# Patient Record
Sex: Female | Born: 1955
Health system: Southern US, Community
[De-identification: ages and names within clinical notes are randomized; demographics above are authoritative.]

## PROBLEM LIST (undated history)

## (undated) DIAGNOSIS — I2510 Atherosclerotic heart disease of native coronary artery without angina pectoris: Secondary | ICD-10-CM

## (undated) DIAGNOSIS — I10 Essential (primary) hypertension: Secondary | ICD-10-CM

## (undated) DIAGNOSIS — M1990 Unspecified osteoarthritis, unspecified site: Secondary | ICD-10-CM

## (undated) DIAGNOSIS — N183 Chronic kidney disease, stage 3 unspecified: Secondary | ICD-10-CM

## (undated) DIAGNOSIS — E876 Hypokalemia: Secondary | ICD-10-CM

## (undated) DIAGNOSIS — I340 Nonrheumatic mitral (valve) insufficiency: Secondary | ICD-10-CM

## (undated) DIAGNOSIS — G43909 Migraine, unspecified, not intractable, without status migrainosus: Secondary | ICD-10-CM

## (undated) DIAGNOSIS — A419 Sepsis, unspecified organism: Secondary | ICD-10-CM

## (undated) DIAGNOSIS — D61818 Other pancytopenia: Secondary | ICD-10-CM

## (undated) DIAGNOSIS — D696 Thrombocytopenia, unspecified: Secondary | ICD-10-CM

## (undated) DIAGNOSIS — A0472 Enterocolitis due to Clostridium difficile, not specified as recurrent: Secondary | ICD-10-CM

## (undated) DIAGNOSIS — E559 Vitamin D deficiency, unspecified: Secondary | ICD-10-CM

## (undated) HISTORY — DX: Vitamin D deficiency, unspecified: E55.9

## (undated) HISTORY — DX: Unspecified osteoarthritis, unspecified site: M19.90

## (undated) HISTORY — DX: Sepsis, unspecified organism: A41.9

## (undated) HISTORY — DX: Enterocolitis due to Clostridium difficile, not specified as recurrent: A04.72

## (undated) HISTORY — DX: Thrombocytopenia, unspecified: D69.6

## (undated) HISTORY — DX: Hypokalemia: E87.6

## (undated) HISTORY — DX: Migraine, unspecified, not intractable, without status migrainosus: G43.909

## (undated) HISTORY — DX: Essential (primary) hypertension: I10

## (undated) HISTORY — DX: Chronic kidney disease, stage 3 (moderate): N18.3

## (undated) HISTORY — DX: Chronic kidney disease, stage 3 unspecified: N18.30

## (undated) HISTORY — PX: OTHER SURGICAL HISTORY: SHX169

## (undated) HISTORY — DX: Nonrheumatic mitral (valve) insufficiency: I34.0

## (undated) HISTORY — DX: Atherosclerotic heart disease of native coronary artery without angina pectoris: I25.10

---

## 1999-03-13 ENCOUNTER — Encounter: Admission: RE | Admit: 1999-03-13 | Discharge: 1999-03-13 | Payer: Self-pay | Admitting: Family Medicine

## 1999-03-13 ENCOUNTER — Encounter: Payer: Self-pay | Admitting: Family Medicine

## 2000-07-28 ENCOUNTER — Encounter: Payer: Self-pay | Admitting: Family Medicine

## 2000-07-28 ENCOUNTER — Encounter: Admission: RE | Admit: 2000-07-28 | Discharge: 2000-07-28 | Payer: Self-pay | Admitting: Family Medicine

## 2006-04-27 ENCOUNTER — Encounter: Admission: RE | Admit: 2006-04-27 | Discharge: 2006-04-27 | Payer: Self-pay | Admitting: Family Medicine

## 2010-01-31 ENCOUNTER — Encounter: Payer: Self-pay | Admitting: Family Medicine

## 2010-05-21 ENCOUNTER — Inpatient Hospital Stay (HOSPITAL_COMMUNITY)
Admission: EM | Admit: 2010-05-21 | Discharge: 2010-05-23 | DRG: 920 | Disposition: A | Discharge status: Home / Self Care | Payer: 59 | Source: Ambulatory Visit | Attending: Gastroenterology | Admitting: Gastroenterology

## 2010-05-21 DIAGNOSIS — E669 Obesity, unspecified: Secondary | ICD-10-CM | POA: Diagnosis present

## 2010-05-21 DIAGNOSIS — Y849 Medical procedure, unspecified as the cause of abnormal reaction of the patient, or of later complication, without mention of misadventure at the time of the procedure: Secondary | ICD-10-CM | POA: Diagnosis present

## 2010-05-21 DIAGNOSIS — Z882 Allergy status to sulfonamides status: Secondary | ICD-10-CM

## 2010-05-21 DIAGNOSIS — IMO0002 Reserved for concepts with insufficient information to code with codable children: Principal | ICD-10-CM | POA: Diagnosis present

## 2010-05-21 DIAGNOSIS — G43909 Migraine, unspecified, not intractable, without status migrainosus: Secondary | ICD-10-CM | POA: Diagnosis present

## 2010-05-21 DIAGNOSIS — Z860100 Personal history of colon polyps, unspecified: Secondary | ICD-10-CM

## 2010-05-21 DIAGNOSIS — K633 Ulcer of intestine: Secondary | ICD-10-CM | POA: Diagnosis present

## 2010-05-21 DIAGNOSIS — Y92009 Unspecified place in unspecified non-institutional (private) residence as the place of occurrence of the external cause: Secondary | ICD-10-CM

## 2010-05-21 DIAGNOSIS — D62 Acute posthemorrhagic anemia: Secondary | ICD-10-CM | POA: Diagnosis present

## 2010-05-21 DIAGNOSIS — K5730 Diverticulosis of large intestine without perforation or abscess without bleeding: Secondary | ICD-10-CM | POA: Diagnosis present

## 2010-05-21 LAB — DIFFERENTIAL
Basophils Absolute: 0 10*3/uL (ref 0.0–0.1)
Basophils Relative: 0 % (ref 0–1)
Eosinophils Absolute: 0.1 10*3/uL (ref 0.0–0.7)
Eosinophils Relative: 2 % (ref 0–5)
Lymphocytes Relative: 32 % (ref 12–46)
Lymphs Abs: 1.5 10*3/uL (ref 0.7–4.0)
Monocytes Absolute: 0.4 10*3/uL (ref 0.1–1.0)
Monocytes Relative: 9 % (ref 3–12)
Neutro Abs: 2.7 10*3/uL (ref 1.7–7.7)
Neutrophils Relative %: 57 % (ref 43–77)

## 2010-05-21 LAB — COMPREHENSIVE METABOLIC PANEL
BUN: 20 mg/dL (ref 6–23)
CO2: 24 mEq/L (ref 19–32)
Chloride: 107 mEq/L (ref 96–112)
Creatinine, Ser: 0.81 mg/dL (ref 0.4–1.2)
GFR calc non Af Amer: >60 mL/min (ref >60)
Glucose, Bld: 91 mg/dL (ref 70–99)
Total Bilirubin: 0.4 mg/dL (ref 0.3–1.2)

## 2010-05-21 LAB — CBC
Hemoglobin: 10.2 g/dL — ABNORMAL LOW (ref 12.0–15.0)
MCH: 30.3 pg (ref 26.0–34.0)
MCV: 89.6 fL (ref 78.0–100.0)
RBC: 3.37 MIL/uL — ABNORMAL LOW (ref 3.87–5.11)

## 2010-05-21 LAB — TYPE AND SCREEN
ABO/RH(D): O POS
Antibody Screen: NEGATIVE

## 2010-05-21 LAB — ABO/RH: ABO/RH(D): O POS

## 2010-05-22 LAB — BASIC METABOLIC PANEL
Chloride: 113 mEq/L — ABNORMAL HIGH (ref 96–112)
Creatinine, Ser: 0.8 mg/dL (ref 0.4–1.2)
GFR calc Af Amer: >60 mL/min (ref >60)
Potassium: 3.5 mEq/L (ref 3.5–5.1)

## 2010-05-22 LAB — CBC
MCH: 30.6 pg (ref 26.0–34.0)
Platelets: 101 10*3/uL — ABNORMAL LOW (ref 150–400)
RBC: 3.2 MIL/uL — ABNORMAL LOW (ref 3.87–5.11)
WBC: 3.5 10*3/uL — ABNORMAL LOW (ref 4.0–10.5)

## 2010-05-23 LAB — CBC
HCT: 28.8 % — ABNORMAL LOW (ref 36.0–46.0)
Hemoglobin: 9.9 g/dL — ABNORMAL LOW (ref 12.0–15.0)
MCHC: 34.4 g/dL (ref 30.0–36.0)
MCV: 90.7 fL (ref 78.0–100.0)
Platelets: 87 10*3/uL — ABNORMAL LOW (ref 150–400)
RBC: 3.02 MIL/uL — ABNORMAL LOW (ref 3.87–5.11)
WBC: 4.1 10*3/uL (ref 4.0–10.5)

## 2010-06-01 NOTE — Discharge Summary (Signed)
  NAME:  Dana Watkins, Dana Watkins              ACCOUNT NO.:  [id number]  MEDICAL RECORD NO.:  [id number]           PATIENT TYPE:  I  LOCATION:  2010                         FACILITY:  [facility]  PHYSICIAN:  Nelwyn Salisbury, MD, FACGDATE OF BIRTH:  02-Dec-1955  DATE OF ADMISSION:  05/21/2010 DATE OF DISCHARGE:  05/23/2010                              DISCHARGE SUMMARY   REASON FOR ADMISSION:  Rectal bleeding status post colonoscopy on May 18, 2010.  DISCHARGE DIAGNOSES: 1. Post polypectomy bleed, now resolved. 2. Anemia status post post polypectomy bleed with hemoglobin of 9.3 on     discharge. 3. Sigmoid diverticulosis. 4. Tubular adenoma removed from right colon on May 18, 2010. 5. History of migraine headaches on butalbital.  HOSPITAL COURSE:  [name] is a very pleasant 55 year old white female who underwent a routine screening colonoscopy on May 18, 2010, at the [facility] and was found to have 3 right colon polyps that were removed by hot snare and one small sigmoid polyp removed by cord biopsies.  On May 21, 2010, after dinner she developed rectal bleeding and started passing clots through the night into the next morning.  When she called the office she was asked to come to the emergency room where she was admitted by Dr. Benson Norway and was prepped for repeat colonoscopy which was done yesterday.  Bleeding site was noticed in the right colon where the patient had a polypectomy.  A clean based ulcer was noted with fresh heme around it.  This area was injected circumferentially with 4 mL of epinephrine, 1:10,000 and two resolution clips were applied over the ulcer.  Hemostasis was achieved.  The rest of the colon appeared normal except for a few scattered sigmoid diverticula.  There was some residual stool in the colon.  The appendiceal orifice and cecal base were not visualized.  The second polypectomy site appeared healthy as well.  The third polypectomy site was not  identified because of relatively limited visualization of the colon in this area.  The patient tolerated the procedure well without complications.  DISCHARGE INSTRUCTIONS: 1. The patient has been advised to follow a soft diet for the next     couple of days and then to resume her normal diet starting May 25, 2010. 2. Avoid all nonsteroidals including aspirin, Advil, Aleve, Motrin,     ibuprofen, Excedrin, BC's, Goody's, Alka Seltzer for the next 2     weeks. 3. Outpatient followup in the next 2 weeks for repeat CBC. 4. Multivitamin with iron after repeat CBC has been done to treat her     iron deficiency anemia. 5. Further recommendation to be made in followup. 6. The patient has been advised if she has any recurrent bleeding to     contact the office immediately for further recommendations.     Nelwyn Salisbury, MD, [facility]     JNM/MEDQ  D:  05/23/2010  T:  05/23/2010  Job:  [id number]  cc:   Juanita Craver, M.D.  Electronically Signed by Juanita Craver M.D. on 06/01/2010 04:46:54 PM

## 2010-06-01 NOTE — Consult Note (Signed)
  NAME:  Dana Watkins, Dana Watkins              ACCOUNT NO.:  [id number]  MEDICAL RECORD NO.:  [id number]           PATIENT TYPE:  I  LOCATION:  2010                         FACILITY:  [facility]  PHYSICIAN:  Tory Emerald. Benson Norway, MD    DATE OF BIRTH:  [birth year]  DATE OF CONSULTATION:  05/21/2010 DATE OF DISCHARGE:                                CONSULTATION   REASON FOR ADMISSION:  Post polypectomy bleed.  HISTORY OF PRESENT ILLNESS:  This is a 55 year old female without any significant past medical history who was admitted to the hospital with a post polypectomy bleed.  She underwent a colonoscopy by Dr. Collene Mares on May 18, 2010, for a routine screening colonoscopy and at that time she was identified to have three 5-6 mm polyps in her ascending colon.  These were removed with a hot snare and subsequently she started having bleeding issues that started last night.  In her estimation, she has had 10 bloody bowel movements, they range from fresh blood to dark blood and blood clots.  No complaints of any abdominal pain, nausea, vomiting, fever.  She does not complain of any chest pain or shortness of breath. Overall, she appears to be hemodynamically stable.  As a result of these symptoms, she called the office and was instructed by Dr. Collene Mares to present to the emergency room for further evaluation and treatment.  As a result of the bleeding issue, she was admitted to the hospital.  PAST MEDICAL HISTORY:  As stated above.  PAST SURGICAL HISTORY:  As stated above.  FAMILY HISTORY:  Noncontributory.  SOCIAL HISTORY:  Positive for occasional alcohol and occasional tobacco, no illicit drug use.  HOME MEDICATIONS:  A migraine headache that she uses on a p.r.n. basis.  ALLERGIES:  Possible SULFA allergy.  REVIEW OF SYSTEMS:  As stated above in history of present illness, otherwise, negative.  PHYSICAL EXAMINATION:  VITAL SIGNS:  Blood pressure is 135/83, heart rate 80, respirations 16. GENERAL:  The  patient is in no acute distress, alert and oriented. HEENT:  Normocephalic, atraumatic.  Extraocular muscles intact. NECK:  Supple.  No lymphadenopathy. LUNGS:  Clear to auscultation bilaterally. CARDIOVASCULAR:  Regular rate and rhythm. ABDOMEN:  Moderately obese, soft, nontender, nondistended.  Positive bowel sounds. EXTREMITIES:  No clubbing, cyanosis or edema. RECTAL:  Deferred at this time as there is some privacy issues at this time.  LABORATORY VALUES:  Pending at this time.  IMPRESSION:  Post polypectomy bleed.  It is clear from her history and a recent colonoscopy that this was a source of her bleeding.  A repeat colonoscopy will be performed at this time.  Currently, she is hemodynamically stable but it will be prudent to have her admitted to a telemetry unit to ensure that she does not have any significant changes overnight.  PLAN: 1. Colonoscopy tomorrow. 2. To check CBC. 3. To transfuse if hemoglobin is less than 8.     Tory Emerald Benson Norway, MD     PDH/MEDQ  D:  05/21/2010  T:  05/22/2010  Job:  [id number]  Electronically Signed by Carol Ada MD on 06/01/2010 06:49:11 AM

## 2011-05-06 ENCOUNTER — Other Ambulatory Visit: Payer: Self-pay | Admitting: Family Medicine

## 2011-05-06 DIAGNOSIS — Z1231 Encounter for screening mammogram for malignant neoplasm of breast: Secondary | ICD-10-CM

## 2011-05-27 ENCOUNTER — Ambulatory Visit: Payer: 59

## 2013-04-12 ENCOUNTER — Telehealth: Payer: Self-pay | Admitting: Hematology and Oncology

## 2013-04-12 NOTE — Telephone Encounter (Signed)
S/W PATIENT AND GAVE NEW PATIENT APPT FOR 04/20 @ 1:30 W/DR. Stark.  REFERRING DR. Juanita Craver DX- DEC'D PLTS, ELEVATED ALKALINE PHOSPHATASE WELCOME PACKET MAILED.

## 2013-04-12 NOTE — Telephone Encounter (Signed)
C/D 04/12/13 for appt. 04/30/13

## 2013-04-30 ENCOUNTER — Telehealth: Payer: Self-pay | Admitting: Hematology and Oncology

## 2013-04-30 ENCOUNTER — Encounter: Payer: Self-pay | Admitting: Hematology and Oncology

## 2013-04-30 ENCOUNTER — Ambulatory Visit (HOSPITAL_BASED_OUTPATIENT_CLINIC_OR_DEPARTMENT_OTHER): Payer: 59 | Admitting: Hematology and Oncology

## 2013-04-30 ENCOUNTER — Ambulatory Visit (HOSPITAL_BASED_OUTPATIENT_CLINIC_OR_DEPARTMENT_OTHER): Payer: 59

## 2013-04-30 ENCOUNTER — Ambulatory Visit: Payer: 59

## 2013-04-30 VITALS — BP 151/87 | HR 71 | Temp 98.4°F | Resp 18 | Ht 68.0 in | Wt 162.7 lb

## 2013-04-30 DIAGNOSIS — R7402 Elevation of levels of lactic acid dehydrogenase (LDH): Secondary | ICD-10-CM

## 2013-04-30 DIAGNOSIS — R7989 Other specified abnormal findings of blood chemistry: Secondary | ICD-10-CM | POA: Insufficient documentation

## 2013-04-30 DIAGNOSIS — R748 Abnormal levels of other serum enzymes: Secondary | ICD-10-CM

## 2013-04-30 DIAGNOSIS — D696 Thrombocytopenia, unspecified: Secondary | ICD-10-CM | POA: Insufficient documentation

## 2013-04-30 DIAGNOSIS — R945 Abnormal results of liver function studies: Secondary | ICD-10-CM

## 2013-04-30 DIAGNOSIS — R7401 Elevation of levels of liver transaminase levels: Secondary | ICD-10-CM

## 2013-04-30 DIAGNOSIS — R740 Nonspecific elevation of levels of transaminase and lactic acid dehydrogenase [LDH]: Secondary | ICD-10-CM

## 2013-04-30 LAB — CBC WITH DIFFERENTIAL/PLATELET
BASO%: 0.3 % (ref 0.0–2.0)
Basophils Absolute: 0 10*3/uL (ref 0.0–0.1)
EOS%: 2.8 % (ref 0.0–7.0)
Eosinophils Absolute: 0.1 10*3/uL (ref 0.0–0.5)
HCT: 37.3 % (ref 34.8–46.6)
HGB: 12.4 g/dL (ref 11.6–15.9)
LYMPH#: 1.1 10*3/uL (ref 0.9–3.3)
LYMPH%: 28 % (ref 14.0–49.7)
MCH: 30.5 pg (ref 25.1–34.0)
MCHC: 33.2 g/dL (ref 31.5–36.0)
MCV: 91.9 fL (ref 79.5–101.0)
MONO#: 0.4 10*3/uL (ref 0.1–0.9)
MONO%: 9.4 % (ref 0.0–14.0)
NEUT#: 2.3 10*3/uL (ref 1.5–6.5)
NEUT%: 59.5 % (ref 38.4–76.8)
Platelets: 106 10*3/uL — ABNORMAL LOW (ref 145–400)
RBC: 4.06 10*6/uL (ref 3.70–5.45)
RDW: 13.9 % (ref 11.2–14.5)
WBC: 3.9 10*3/uL (ref 3.9–10.3)

## 2013-04-30 LAB — HEPATIC FUNCTION PANEL
ALBUMIN: 4.1 g/dL (ref 3.5–5.2)
ALT: 28 U/L (ref 0–35)
AST: 40 U/L — ABNORMAL HIGH (ref 0–37)
Alkaline Phosphatase: 196 U/L — ABNORMAL HIGH (ref 39–117)
Bilirubin, Direct: 0.2 mg/dL (ref 0.0–0.3)
Indirect Bilirubin: 0.8 mg/dL (ref 0.2–1.2)
TOTAL PROTEIN: 6.8 g/dL (ref 6.0–8.3)
Total Bilirubin: 1 mg/dL (ref 0.2–1.2)

## 2013-04-30 LAB — CHCC SMEAR

## 2013-04-30 NOTE — Progress Notes (Signed)
Checked in new pt with no financial concerns. °

## 2013-04-30 NOTE — Progress Notes (Signed)
[facility] NOTE  Patient Care Team: Stephens Shire, MD as PCP - General (Family Medicine) Stephens Shire, MD as Referring Physician (Family Medicine)  CHIEF COMPLAINTS/PURPOSE OF CONSULTATION:  Chronic thrombocytopenia  HISTORY OF PRESENTING ILLNESS:  Dana Watkins 58 y.o. female is here because of thrombocytopenia.  She was found to have abnormal CBC from routine blood work. On review of her blood work since 2012, her platelet count fluctuated from 87,000 to 119,000. She complains of easy bruising and rare occasions of epistaxis, but denies hematuria, melena or hematochezia The patient denies history of liver disease, exposure to heparin, history of cardiac murmur/prior cardiovascular surgery or recent new medications She denies prior blood or platelet transfusions  MEDICAL HISTORY:  History reviewed. No pertinent past medical history.  SURGICAL HISTORY: History reviewed. No pertinent past surgical history.  SOCIAL HISTORY: History   Social History  . Marital Status: Married    Spouse Name: N/A    Number of Children: N/A  . Years of Education: N/A   Occupational History  . Not on file.   Social History Main Topics  . Smoking status: Never Smoker   . Smokeless tobacco: Never Used  . Alcohol Use: 0.6 oz/week    1 Glasses of wine per week  . Drug Use: No  . Sexual Activity: Not on file   Other Topics Concern  . Not on file   Social History Narrative  . No narrative on file    FAMILY HISTORY: History reviewed. No pertinent family history.  ALLERGIES:  is allergic to sulfa antibiotics.  MEDICATIONS:  Current Outpatient Prescriptions  Medication Sig Dispense Refill  . diazepam (VALIUM) 10 MG tablet Take 10 mg by mouth at bedtime as needed.      . naproxen sodium (ANAPROX) 220 MG tablet Take 220 mg by mouth as needed.      . Phendimetrazine Tartrate 35 MG TABS Take 35 mg by mouth 2 (two) times daily. Takes once or twice daily        No current facility-administered medications for this visit.    REVIEW OF SYSTEMS:   Constitutional: Denies fevers, chills or abnormal night sweats Eyes: Denies blurriness of vision, double vision or watery eyes Ears, nose, mouth, throat, and face: Denies mucositis or sore throat Respiratory: Denies cough, dyspnea or wheezes Cardiovascular: Denies palpitation, chest discomfort or lower extremity swelling Gastrointestinal:  Denies nausea, heartburn or change in bowel habits Skin: Denies abnormal skin rashes Lymphatics: Denies new lymphadenopathy or easy bruising Neurological:Denies numbness, tingling or new weaknesses Behavioral/Psych: Mood is stable, no new changes  All other systems were reviewed with the patient and are negative.  PHYSICAL EXAMINATION: ECOG PERFORMANCE STATUS: 0 - Asymptomatic  Filed Vitals:   04/30/13 1351  BP: 151/87  Pulse: 71  Temp: 98.4 F (36.9 C)  Resp: 18   Filed Weights   04/30/13 1351  Weight: 162 lb 11.2 oz (73.8 kg)    GENERAL:alert, no distress and comfortable SKIN: skin color, texture, turgor are normal, no rashes or significant lesions EYES: normal, conjunctiva are pink and non-injected, sclera clear OROPHARYNX:no exudate, no erythema and lips, buccal mucosa, and tongue normal  NECK: supple, thyroid normal size, non-tender, without nodularity LYMPH:  no palpable lymphadenopathy in the cervical, axillary or inguinal LUNGS: clear to auscultation and percussion with normal breathing effort HEART: regular rate & rhythm and no murmurs and no lower extremity edema ABDOMEN:abdomen soft, non-tender and normal bowel sounds Musculoskeletal:no cyanosis of digits and no  clubbing  PSYCH: alert & oriented x 3 with fluent speech NEURO: no focal motor/sensory deficits  LABORATORY DATA:  I have reviewed the data as listed Recent Results (from the past 2160 hour(s))  CBC WITH DIFFERENTIAL     Status: Abnormal   Collection Time    04/30/13  2:26 PM       Result Value Ref Range   WBC 3.9  3.9 - 10.3 10e3/uL   NEUT# 2.3  1.5 - 6.5 10e3/uL   HGB 12.4  11.6 - 15.9 g/dL   HCT 37.3  34.8 - 46.6 %   Platelets 106 Platelet count consistent in citrate (*) 145 - 400 10e3/uL   MCV 91.9  79.5 - 101.0 fL   MCH 30.5  25.1 - 34.0 pg   MCHC 33.2  31.5 - 36.0 g/dL   RBC 4.06  3.70 - 5.45 10e6/uL   RDW 13.9  11.2 - 14.5 %   lymph# 1.1  0.9 - 3.3 10e3/uL   MONO# 0.4  0.1 - 0.9 10e3/uL   Eosinophils Absolute 0.1  0.0 - 0.5 10e3/uL   Basophils Absolute 0.0  0.0 - 0.1 10e3/uL   NEUT% 59.5  38.4 - 76.8 %   LYMPH% 28.0  14.0 - 49.7 %   MONO% 9.4  0.0 - 14.0 %   EOS% 2.8  0.0 - 7.0 %   BASO% 0.3  0.0 - 2.0 %  [facility] SMEAR     Status: None   Collection Time    04/30/13  2:26 PM      Result Value Ref Range   Smear Result Smear Available     ASSESSMENT:  Thrombocytopenia  PLAN #1 Thrombocytopenia I suspect she may have ITP I will order an additional workup to exclude autoimmune disease, infection and nutritional causes. I will see her back after that to review test results. #2 elevated alkaline phosphatase and AST I will order a hepatic panel for further evaluation to exclude liver disease.

## 2013-04-30 NOTE — Telephone Encounter (Signed)
Gave pt appt for MD , sent pt to labs today

## 2013-05-01 LAB — VITAMIN B12: VITAMIN B 12: 366 pg/mL (ref 211–911)

## 2013-05-01 LAB — ANA: ANA: NEGATIVE

## 2013-05-01 LAB — HIV ANTIBODY (ROUTINE TESTING W REFLEX): HIV 1&2 Ab, 4th Generation: NONREACTIVE

## 2013-05-01 LAB — HEPATITIS C ANTIBODY: HCV AB: NEGATIVE

## 2013-05-01 LAB — RHEUMATOID FACTOR

## 2013-05-01 LAB — HEPATITIS B SURFACE ANTIGEN: HEP B S AG: NEGATIVE

## 2013-05-01 LAB — HEPATITIS B SURFACE ANTIBODY,QUALITATIVE: HEP B S AB: NEGATIVE

## 2013-05-11 ENCOUNTER — Ambulatory Visit: Payer: 59

## 2013-05-11 ENCOUNTER — Telehealth: Payer: Self-pay | Admitting: Hematology and Oncology

## 2013-05-11 ENCOUNTER — Ambulatory Visit (HOSPITAL_BASED_OUTPATIENT_CLINIC_OR_DEPARTMENT_OTHER): Payer: 59 | Admitting: Hematology and Oncology

## 2013-05-11 VITALS — BP 144/88 | HR 70 | Temp 97.7°F | Resp 18 | Ht 68.0 in | Wt 165.8 lb

## 2013-05-11 DIAGNOSIS — D696 Thrombocytopenia, unspecified: Secondary | ICD-10-CM

## 2013-05-11 DIAGNOSIS — R7989 Other specified abnormal findings of blood chemistry: Secondary | ICD-10-CM

## 2013-05-11 DIAGNOSIS — R945 Abnormal results of liver function studies: Secondary | ICD-10-CM

## 2013-05-11 NOTE — Telephone Encounter (Signed)
gve the pt her may 2015 appt calendar. Pt aware she will be called with the appt for the [place].

## 2013-05-11 NOTE — Progress Notes (Signed)
[place] OFFICE PROGRESS NOTE  BURNETT,BRENT A, MD DIAGNOSIS:  Chronic thrombocytopenia, abnormal liver function test  SUMMARY OF HEMATOLOGIC HISTORY: She was found to have abnormal CBC from routine blood work. On review of her blood work since 2012, her platelet count fluctuated from 87,000 to 119,000. She complains of easy bruising. The patient denies history of liver disease, exposure to heparin, history of cardiac murmur/prior cardiovascular surgery or recent new medications. Blood work including screening for autoimmune disease, nutritional deficiencies and viral infections were negative. INTERVAL HISTORY: Dana Watkins 58 y.o. female returns for further followup. She is very anxious about the cause of her abnormal blood work. She denies any recent bleeding.  I have reviewed the past medical history, past surgical history, social history and family history with the patient and they are unchanged from previous note.  ALLERGIES:  is allergic to sulfa antibiotics.  MEDICATIONS:  Current Outpatient Prescriptions  Medication Sig Dispense Refill  . diazepam (VALIUM) 10 MG tablet Take 10 mg by mouth at bedtime as needed.      . naproxen sodium (ANAPROX) 220 MG tablet Take 220 mg by mouth as needed.      . Phendimetrazine Tartrate 35 MG TABS Take 35 mg by mouth 2 (two) times daily. Takes once or twice daily       No current facility-administered medications for this visit.     REVIEW OF SYSTEMS:   All other systems were reviewed with the patient and are negative.  PHYSICAL EXAMINATION: ECOG PERFORMANCE STATUS: 1 - Symptomatic but completely ambulatory  Filed Vitals:   05/11/13 1445  BP: 144/88  Pulse: 70  Temp: 97.7 F (36.5 C)  Resp: 18   Filed Weights   05/11/13 1445  Weight: 165 lb 12.8 oz (75.206 kg)    GENERAL:alert, no distress and comfortable Musculoskeletal:no cyanosis of digits and no clubbing  NEURO: alert & oriented x 3 with fluent  speech, no focal motor/sensory deficits  LABORATORY DATA:  I have reviewed the data as listed No results found for this or any previous visit (from the past 48 hour(s)).  Lab Results  Component Value Date   WBC 3.9 04/30/2013   HGB 12.4 04/30/2013   HCT 37.3 04/30/2013   MCV 91.9 04/30/2013   PLT 106 Platelet count consistent in citrate* 04/30/2013   I reviewed her peripheral smear. There is no evidence of platelet clumping. No evidence of schistocytes.  ASSESSMENT & PLAN:  #1 thrombocytopenia #2 abnormal liver function tests I will proceed to order ultrasound of her liver to exclude liver disease. The abnormal alkaline phosphatase could also be related to high turnover of her bone. I recommend a trial of vitamin D. I also discussed with her the possibility of diagnosis of ITP. I will bring her back to review the final test results in about 2 weeks after her ultrasound results. All questions were answered. The patient knows to call the clinic with any problems, questions or concerns. No barriers to learning was detected.  I spent 25 minutes counseling the patient face to face. The total time spent in the appointment was 30 minutes and more than 50% was on counseling.     Heath Lark, MD 05/11/2013 7:56 PM

## 2013-05-18 ENCOUNTER — Telehealth: Payer: Self-pay | Admitting: *Deleted

## 2013-05-18 ENCOUNTER — Ambulatory Visit (HOSPITAL_COMMUNITY)
Admission: RE | Admit: 2013-05-18 | Discharge: 2013-05-18 | Disposition: A | Discharge status: Home / Self Care | Payer: 59 | Source: Ambulatory Visit | Attending: Hematology and Oncology | Admitting: Hematology and Oncology

## 2013-05-18 DIAGNOSIS — R945 Abnormal results of liver function studies: Secondary | ICD-10-CM | POA: Insufficient documentation

## 2013-05-18 DIAGNOSIS — D696 Thrombocytopenia, unspecified: Secondary | ICD-10-CM

## 2013-05-18 DIAGNOSIS — R7989 Other specified abnormal findings of blood chemistry: Secondary | ICD-10-CM

## 2013-05-18 NOTE — Telephone Encounter (Signed)
Received call from [place] @ [facility] for report  Results of US Abdomen Limited done today.   Results printed and left on Dr. Calton Dach desk for review.

## 2013-05-20 ENCOUNTER — Other Ambulatory Visit: Payer: Self-pay | Admitting: Hematology and Oncology

## 2013-05-20 DIAGNOSIS — D696 Thrombocytopenia, unspecified: Secondary | ICD-10-CM

## 2013-05-20 DIAGNOSIS — R7989 Other specified abnormal findings of blood chemistry: Secondary | ICD-10-CM

## 2013-05-20 DIAGNOSIS — R945 Abnormal results of liver function studies: Principal | ICD-10-CM

## 2013-05-21 ENCOUNTER — Telehealth: Payer: Self-pay | Admitting: *Deleted

## 2013-05-21 ENCOUNTER — Telehealth: Payer: Self-pay | Admitting: Hematology and Oncology

## 2013-05-21 NOTE — Telephone Encounter (Signed)
per pof o sch appt for pt/will call pt w/appt time & date

## 2013-05-21 NOTE — Telephone Encounter (Signed)
Explained to pt reason for MRI and that we will also move her next office visit w/ Dr. Alvy Bimler until after the MRI on 5/19.  She verbalized understanding.

## 2013-05-21 NOTE — Telephone Encounter (Signed)
They saw spots that may be benign cysts The MRI is to be sure it is not cancerous

## 2013-05-21 NOTE — Telephone Encounter (Signed)
Informed pt of [place] abnormal and MRI recommended to look at liver.  Pt concerned and requests further information about [place] results and what MRI is to determine?  She asks if Dr. Alvy Bimler can call her back or if nurse can explain?

## 2013-05-21 NOTE — Telephone Encounter (Signed)
Message copied by Cathlean Cower on Mon May 21, 2013  8:43 AM ------      Message from: [facility], [place]      Created: Sun May 20, 2013  8:42 PM      Regarding: abnormal [place]       PLease let her know her [place] is abnormal and MR liver is recommended      I ordered POF and reschedule her appt to next week      Can you call her to let her know>      ----- Message -----         From: Rad Results In Interface         Sent: 05/18/2013   1:37 PM           To: Heath Lark, MD                   ------

## 2013-05-21 NOTE — Telephone Encounter (Signed)
lmonvm advising the pt of her cancelled may 13 and 19th appts until after the mri appt . R/s to 05/31/2013.

## 2013-05-23 ENCOUNTER — Ambulatory Visit: Payer: 59 | Admitting: Hematology and Oncology

## 2013-05-24 ENCOUNTER — Ambulatory Visit: Payer: 59 | Admitting: Hematology and Oncology

## 2013-05-29 ENCOUNTER — Ambulatory Visit: Payer: 59 | Admitting: Hematology and Oncology

## 2013-05-29 ENCOUNTER — Ambulatory Visit (HOSPITAL_COMMUNITY)
Admission: RE | Admit: 2013-05-29 | Discharge: 2013-05-29 | Disposition: A | Discharge status: Home / Self Care | Payer: 59 | Source: Ambulatory Visit | Attending: Hematology and Oncology | Admitting: Hematology and Oncology

## 2013-05-29 ENCOUNTER — Other Ambulatory Visit: Payer: Self-pay | Admitting: Hematology and Oncology

## 2013-05-29 DIAGNOSIS — R945 Abnormal results of liver function studies: Secondary | ICD-10-CM

## 2013-05-29 DIAGNOSIS — D696 Thrombocytopenia, unspecified: Secondary | ICD-10-CM

## 2013-05-29 DIAGNOSIS — R7989 Other specified abnormal findings of blood chemistry: Secondary | ICD-10-CM

## 2013-05-29 DIAGNOSIS — K7689 Other specified diseases of liver: Secondary | ICD-10-CM | POA: Insufficient documentation

## 2013-05-29 DIAGNOSIS — N281 Cyst of kidney, acquired: Secondary | ICD-10-CM | POA: Insufficient documentation

## 2013-05-29 MED ORDER — GADOBENATE DIMEGLUMINE 529 MG/ML IV SOLN
15.0000 mL | Freq: Once | INTRAVENOUS | Status: AC | PRN
  Administered 2013-05-29: 15 mL via INTRAVENOUS

## 2013-05-30 ENCOUNTER — Telehealth: Payer: Self-pay | Admitting: *Deleted

## 2013-05-30 NOTE — Telephone Encounter (Signed)
Informed pt that report is not complete.  We have contacted [facility] and they will ask Dr. Clovis Riley to revise report when he comes in tomorrow morning.  Keep appt as scheduled tomorrow and Dr. Alvy Bimler will review report w/ her.  She verbalized understanding.

## 2013-05-30 NOTE — Telephone Encounter (Signed)
No, radiology need to revise their report

## 2013-05-30 NOTE — Telephone Encounter (Signed)
Pt asking about results of Liver MRI.  She sees Dr. Alvy Bimler tomorrow but is anxious for results.

## 2013-05-31 ENCOUNTER — Encounter: Payer: Self-pay | Admitting: Hematology and Oncology

## 2013-05-31 ENCOUNTER — Ambulatory Visit (HOSPITAL_BASED_OUTPATIENT_CLINIC_OR_DEPARTMENT_OTHER): Payer: 59 | Admitting: Hematology and Oncology

## 2013-05-31 VITALS — BP 139/90 | HR 86 | Temp 97.9°F | Resp 20 | Ht 68.0 in

## 2013-05-31 DIAGNOSIS — R7989 Other specified abnormal findings of blood chemistry: Secondary | ICD-10-CM

## 2013-05-31 DIAGNOSIS — R748 Abnormal levels of other serum enzymes: Secondary | ICD-10-CM

## 2013-05-31 DIAGNOSIS — K7689 Other specified diseases of liver: Secondary | ICD-10-CM

## 2013-05-31 DIAGNOSIS — N281 Cyst of kidney, acquired: Secondary | ICD-10-CM

## 2013-05-31 DIAGNOSIS — R945 Abnormal results of liver function studies: Secondary | ICD-10-CM

## 2013-05-31 DIAGNOSIS — D696 Thrombocytopenia, unspecified: Secondary | ICD-10-CM

## 2013-05-31 NOTE — Progress Notes (Signed)
[place] OFFICE PROGRESS NOTE  BURNETT,BRENT A, MD DIAGNOSIS:  Chronic thrombocytopenia, likely ITP  SUMMARY OF HEMATOLOGIC HISTORY: She was found to have abnormal CBC from routine blood work. On review of her blood work since 2012, her platelet count fluctuated from 87,000 to 119,000. She complains of easy bruising. The patient denies history of liver disease, exposure to heparin, history of cardiac murmur/prior cardiovascular surgery or recent new medications. Blood work including screening for autoimmune disease, nutritional deficiencies and viral infections were negative. She has mild abnormal liver function tests. Ultrasound shows some abnormal lesions and no liver. MRI showed kidney cyst and liver cysts INTERVAL HISTORY: Dana Watkins 58 y.o. female returns for further followup. She feels well.  I have reviewed the past medical history, past surgical history, social history and family history with the patient and they are unchanged from previous note.  ALLERGIES:  is allergic to sulfa antibiotics.  MEDICATIONS:  Current Outpatient Prescriptions  Medication Sig Dispense Refill  . diazepam (VALIUM) 10 MG tablet Take 10 mg by mouth at bedtime as needed.      . naproxen sodium (ANAPROX) 220 MG tablet Take 220 mg by mouth as needed.      . Phendimetrazine Tartrate 35 MG TABS Take 35 mg by mouth 2 (two) times daily. Takes once or twice daily       No current facility-administered medications for this visit.     REVIEW OF SYSTEMS:   All other systems were reviewed with the patient and are negative.  PHYSICAL EXAMINATION: ECOG PERFORMANCE STATUS: 0 - Asymptomatic  Filed Vitals:   05/31/13 1113  BP: 139/90  Pulse: 86  Temp: 97.9 F (36.6 C)  Resp: 20   Filed Weights    GENERAL:alert, no distress and comfortable Musculoskeletal:no cyanosis of digits and no clubbing  NEURO: alert & oriented x 3 with fluent speech, no focal motor/sensory  deficits  LABORATORY DATA:  I have reviewed the data as listed No results found for this or any previous visit (from the past 48 hour(s)).  Lab Results  Component Value Date   WBC 3.9 04/30/2013   HGB 12.4 04/30/2013   HCT 37.3 04/30/2013   MCV 91.9 04/30/2013   PLT 106 Platelet count consistent in citrate* 04/30/2013    RADIOGRAPHIC STUDIES: I reviewed the imaging study with the patient and family I have personally reviewed the radiological images as listed and agreed with the findings in the report. Mr Liver W Wo Contrast  05/31/2013   ADDENDUM REPORT: 05/31/2013 09:54  ADDENDUM: IMPRESSION:  1. Liver and kidneys cysts. 2.  No suspicious liver abnormalities identified   Electronically Signed   By: Kerby Moors M.D.   On: 05/31/2013 09:54   05/31/2013   CLINICAL DATA:  Evaluate liver lesions.  Elevated LFTs.  EXAM: MRI ABDOMEN WITHOUT AND WITH CONTRAST  TECHNIQUE: Multiplanar, multisequence MR imaging was performed both before and after administration of intravenous contrast.  CONTRAST:  77m MULTIHANCE GADOBENATE DIMEGLUMINE 529 MG/ML IV SOLN  COMPARISON:  Ultrasound 05/18/2013  FINDINGS: There is a small cyst identified within the left hepatic lobe measuring 7 mm. Tiny cyst along the dome of liver is also noted measuring 6 mm, image 4/series 3. No abnormal enhancing liver abnormalities identified. The gallbladder appears normal. No biliary dilatation. Normal appearance of the pancreas. Spleen measures 11 cm in cranial caudal dimension. The adrenal glands are both normal. Bilateral simple appearing renal cysts are identified. No upper abdominal ascites. No enlarged upper abdominal  lymph nodes noted.  IMPRESSION: 1. No liver and kidneys cysts. 2. No suspicious liver abnormalities identified.  Electronically Signed: By: Kerby Moors M.D. On: 05/29/2013 19:06    ASSESSMENT & PLAN:  #1 thrombocytopenia Likely due to ITP. Due to lack of symptoms, she does not need treatment. I discussed with her  the natural history of ITP. I will bring her back for yearly observation and blood work #2 abnormal kidney and liver cyst I recommend repeat ultrasound in one year to exclude progression #3 abnormal liver function tests/AST & alkaline phosphatase Cause is unknown. The abnormal AST and alkaline phosphatase could also be related to increase bone turnover. I recommend calcium with vitamin D supplements. All questions were answered. The patient knows to call the clinic with any problems, questions or concerns. No barriers to learning was detected.  I spent 15 minutes counseling the patient face to face. The total time spent in the appointment was 20 minutes and more than 50% was on counseling.     Heath Lark, MD 05/31/2013 1:11 PM

## 2013-06-05 ENCOUNTER — Telehealth: Payer: Self-pay | Admitting: Hematology and Oncology

## 2013-06-05 NOTE — Telephone Encounter (Signed)
lmonvm for pt re appts for 5/2 and 5/9 2016. central will call to confirm [place] appt. schedule mailed.

## 2013-06-06 ENCOUNTER — Ambulatory Visit: Payer: 59 | Admitting: Hematology and Oncology

## 2013-06-07 ENCOUNTER — Ambulatory Visit: Payer: 59 | Admitting: Hematology and Oncology

## 2013-06-11 ENCOUNTER — Ambulatory Visit: Payer: 59 | Admitting: Hematology and Oncology

## 2013-10-26 ENCOUNTER — Other Ambulatory Visit: Payer: Self-pay

## 2014-02-13 ENCOUNTER — Telehealth: Payer: Self-pay | Admitting: Hematology and Oncology

## 2014-02-13 NOTE — Telephone Encounter (Signed)
lvm for pt regarding to 5.9 appt moved to 5.10 due to MD on call....mailed pt appt sched and letter

## 2014-04-19 ENCOUNTER — Telehealth: Payer: Self-pay | Admitting: Hematology and Oncology

## 2014-04-19 NOTE — Telephone Encounter (Signed)
lvm for pt regarding to 5.10 appt moved to 5.17 due to MD out of the office.....mailed pt appt sched/letter

## 2014-05-13 ENCOUNTER — Ambulatory Visit (HOSPITAL_COMMUNITY)
Admission: RE | Admit: 2014-05-13 | Discharge: 2014-05-13 | Disposition: A | Discharge status: Home / Self Care | Payer: 59 | Source: Ambulatory Visit | Attending: Hematology and Oncology | Admitting: Hematology and Oncology

## 2014-05-13 ENCOUNTER — Other Ambulatory Visit (HOSPITAL_BASED_OUTPATIENT_CLINIC_OR_DEPARTMENT_OTHER): Payer: 59

## 2014-05-13 DIAGNOSIS — K824 Cholesterolosis of gallbladder: Secondary | ICD-10-CM | POA: Insufficient documentation

## 2014-05-13 DIAGNOSIS — K7689 Other specified diseases of liver: Secondary | ICD-10-CM | POA: Diagnosis present

## 2014-05-13 DIAGNOSIS — D696 Thrombocytopenia, unspecified: Secondary | ICD-10-CM

## 2014-05-13 DIAGNOSIS — R945 Abnormal results of liver function studies: Secondary | ICD-10-CM

## 2014-05-13 DIAGNOSIS — R7989 Other specified abnormal findings of blood chemistry: Secondary | ICD-10-CM | POA: Diagnosis not present

## 2014-05-13 LAB — CBC WITH DIFFERENTIAL/PLATELET
BASO%: 0.7 % (ref 0.0–2.0)
Basophils Absolute: 0 10*3/uL (ref 0.0–0.1)
EOS ABS: 0.1 10*3/uL (ref 0.0–0.5)
EOS%: 4.3 % (ref 0.0–7.0)
HCT: 34.2 % — ABNORMAL LOW (ref 34.8–46.6)
HGB: 11.5 g/dL — ABNORMAL LOW (ref 11.6–15.9)
LYMPH%: 32.5 % (ref 14.0–49.7)
MCH: 30.4 pg (ref 25.1–34.0)
MCHC: 33.4 g/dL (ref 31.5–36.0)
MCV: 90.9 fL (ref 79.5–101.0)
MONO#: 0.4 10*3/uL (ref 0.1–0.9)
MONO%: 11.7 % (ref 0.0–14.0)
NEUT#: 1.7 10*3/uL (ref 1.5–6.5)
NEUT%: 50.8 % (ref 38.4–76.8)
PLATELETS: 93 10*3/uL — AB (ref 145–400)
RBC: 3.77 10*6/uL (ref 3.70–5.45)
RDW: 14.4 % (ref 11.2–14.5)
WBC: 3.4 10*3/uL — ABNORMAL LOW (ref 3.9–10.3)
lymph#: 1.1 10*3/uL (ref 0.9–3.3)

## 2014-05-13 LAB — COMPREHENSIVE METABOLIC PANEL (CC13)
ALBUMIN: 3.3 g/dL — AB (ref 3.5–5.0)
ALK PHOS: 190 U/L — AB (ref 40–150)
ALT: 26 U/L (ref 0–55)
ANION GAP: 10 meq/L (ref 3–11)
AST: 42 U/L — ABNORMAL HIGH (ref 5–34)
BUN: 16.2 mg/dL (ref 7.0–26.0)
CALCIUM: 9.3 mg/dL (ref 8.4–10.4)
CO2: 20 meq/L — AB (ref 22–29)
Chloride: 111 mEq/L — ABNORMAL HIGH (ref 98–109)
Creatinine: 1.2 mg/dL — ABNORMAL HIGH (ref 0.6–1.1)
EGFR: 49 mL/min/{1.73_m2} — ABNORMAL LOW (ref >90)
Glucose: 105 mg/dl (ref 70–140)
Potassium: 3.1 mEq/L — ABNORMAL LOW (ref 3.5–5.1)
SODIUM: 141 meq/L (ref 136–145)
Total Bilirubin: 0.73 mg/dL (ref 0.20–1.20)
Total Protein: 6.3 g/dL — ABNORMAL LOW (ref 6.4–8.3)

## 2014-05-20 ENCOUNTER — Ambulatory Visit: Payer: 59 | Admitting: Hematology and Oncology

## 2014-05-21 ENCOUNTER — Ambulatory Visit: Payer: Self-pay | Admitting: Hematology and Oncology

## 2014-05-28 ENCOUNTER — Telehealth: Payer: Self-pay | Admitting: Hematology and Oncology

## 2014-05-28 ENCOUNTER — Ambulatory Visit (HOSPITAL_BASED_OUTPATIENT_CLINIC_OR_DEPARTMENT_OTHER): Payer: 59 | Admitting: Hematology and Oncology

## 2014-05-28 VITALS — BP 127/81 | HR 76 | Temp 98.0°F | Resp 18 | Ht 68.0 in | Wt 168.3 lb

## 2014-05-28 DIAGNOSIS — D72819 Decreased white blood cell count, unspecified: Secondary | ICD-10-CM | POA: Diagnosis not present

## 2014-05-28 DIAGNOSIS — D638 Anemia in other chronic diseases classified elsewhere: Secondary | ICD-10-CM

## 2014-05-28 DIAGNOSIS — D649 Anemia, unspecified: Secondary | ICD-10-CM | POA: Insufficient documentation

## 2014-05-28 DIAGNOSIS — R7989 Other specified abnormal findings of blood chemistry: Secondary | ICD-10-CM | POA: Diagnosis not present

## 2014-05-28 DIAGNOSIS — D696 Thrombocytopenia, unspecified: Secondary | ICD-10-CM

## 2014-05-28 DIAGNOSIS — R945 Abnormal results of liver function studies: Principal | ICD-10-CM

## 2014-05-28 NOTE — Progress Notes (Signed)
[place], MD SUMMARY OF HEMATOLOGIC HISTORY: She was found to have abnormal CBC from routine blood work. On review of her blood work since 2012, her platelet count fluctuated from 87,000 to 119,000. She complains of easy bruising. The patient denies history of liver disease, exposure to heparin, history of cardiac murmur/prior cardiovascular surgery or recent new medications. Blood work including screening for autoimmune disease, nutritional deficiencies and viral infections were negative. She has mild abnormal liver function tests. Ultrasound shows some abnormal lesions. MRI showed kidney cyst and liver cysts INTERVAL HISTORY: Dana Watkins 59 y.o. female returns for further follow-up. She continued to have easy bruising. She take NSAID regularly. The patient denies any recent signs or symptoms of bleeding such as spontaneous epistaxis, hematuria or hematochezia. She denies abdominal pain. She denies excessive alcohol intake. Denies recent infection.  I have reviewed the past medical history, past surgical history, social history and family history with the patient and they are unchanged from previous note.  ALLERGIES:  is allergic to sulfa antibiotics.  MEDICATIONS:  Current Outpatient Prescriptions  Medication Sig Dispense Refill  . diazepam (VALIUM) 10 MG tablet Take 10 mg by mouth at bedtime as needed.    . naproxen sodium (ANAPROX) 220 MG tablet Take 220 mg by mouth as needed.    . Phendimetrazine Tartrate 35 MG TABS Take 35 mg by mouth 2 (two) times daily. Takes once or twice daily     No current facility-administered medications for this visit.     REVIEW OF SYSTEMS:   Constitutional: Denies fevers, chills or night sweats Eyes: Denies blurriness of vision Ears, nose, mouth, throat, and face: Denies mucositis or sore throat Respiratory: Denies cough, dyspnea or wheezes Cardiovascular: Denies palpitation, chest  discomfort or lower extremity swelling Gastrointestinal:  Denies nausea, heartburn or change in bowel habits Skin: Denies abnormal skin rashes Lymphatics: Denies new lymphadenopathy  Neurological:Denies numbness, tingling or new weaknesses Behavioral/Psych: Mood is stable, no new changes  All other systems were reviewed with the patient and are negative.  PHYSICAL EXAMINATION: ECOG PERFORMANCE STATUS: 1 - Symptomatic but completely ambulatory  Filed Vitals:   05/28/14 1301  BP: 127/81  Pulse: 76  Temp: 98 F (36.7 C)  Resp: 18   Filed Weights   05/28/14 1301  Weight: 168 lb 4.8 oz (76.34 kg)    GENERAL:alert, no distress and comfortable SKIN: skin color, texture, turgor are normal, no rashes or significant lesions EYES: normal, Conjunctiva are pink and non-injected, sclera clear OROPHARYNX:no exudate, no erythema and lips, buccal mucosa, and tongue normal  NECK: supple, thyroid normal size, non-tender, without nodularity LYMPH:  no palpable lymphadenopathy in the cervical, axillary or inguinal LUNGS: clear to auscultation and percussion with normal breathing effort HEART: regular rate & rhythm and no murmurs and no lower extremity edema ABDOMEN:abdomen soft, non-tender and normal bowel sounds Musculoskeletal:no cyanosis of digits and no clubbing  NEURO: alert & oriented x 3 with fluent speech, no focal motor/sensory deficits  LABORATORY DATA:  I have reviewed the data as listed No results found for this or any previous visit (from the past 48 hour(s)).  Lab Results  Component Value Date   WBC 3.4* 05/13/2014   HGB 11.5* 05/13/2014   HCT 34.2* 05/13/2014   MCV 90.9 05/13/2014   PLT 93* 05/13/2014    ASSESSMENT & PLAN:  Elevated liver function tests I am concerned about liver disease. I recommend GI referral.    Thrombocytopenia Certainly,  this could be directly related to liver disease ITP. At present level, she does not need transfusion.   Anemia of chronic  illness This is likely anemia of chronic illness. I also recommend she stop taking NSAID for possible cause of GI bleed.   Chronic leukopenia The cause is unknown. The level fluctuated over the years. I recommend observation only.  We also reviewed recent abnormalities in kidney function. The patient felt dehydrated on the day of the blood work due to fasting state. Again as above, I recommend she stop NSAID.  All questions were answered. The patient knows to call the clinic with any problems, questions or concerns. No barriers to learning was detected.  I spent 15 minutes counseling the patient face to face. The total time spent in the appointment was 20 minutes and more than 50% was on counseling.     [facility], Lola Czerwonka, MD 5/17/20161:23 PM

## 2014-05-28 NOTE — Assessment & Plan Note (Signed)
The cause is unknown. The level fluctuated over the years. I recommend observation only.

## 2014-05-28 NOTE — Assessment & Plan Note (Signed)
I am concerned about liver disease. I recommend GI referral.

## 2014-05-28 NOTE — Assessment & Plan Note (Signed)
Certainly, this could be directly related to liver disease ITP. At present level, she does not need transfusion.

## 2014-05-28 NOTE — Telephone Encounter (Signed)
Gave and printed appt sched and avs for pt for May 2017..I sent the Rx to the pharmacy. msg to [facility] and TM to fax all records to Dr. Collene Mares and [facility] office...the patient is requesting to change prvider at same facility....they will call pt and confirm appt.

## 2014-05-28 NOTE — Assessment & Plan Note (Signed)
This is likely anemia of chronic illness. I also recommend she stop taking NSAID for possible cause of GI bleed.

## 2014-05-29 ENCOUNTER — Telehealth: Payer: Self-pay | Admitting: Hematology and Oncology

## 2014-05-29 NOTE — Telephone Encounter (Signed)
Faxed pt medical records to [phone]

## 2014-06-12 ENCOUNTER — Other Ambulatory Visit: Payer: Self-pay | Admitting: Gastroenterology

## 2014-06-12 DIAGNOSIS — R7989 Other specified abnormal findings of blood chemistry: Secondary | ICD-10-CM

## 2014-06-12 DIAGNOSIS — R945 Abnormal results of liver function studies: Principal | ICD-10-CM

## 2014-06-14 ENCOUNTER — Telehealth: Payer: Self-pay | Admitting: Hematology and Oncology

## 2014-06-14 NOTE — Telephone Encounter (Signed)
I spoke with the patient over the telephone. I received copies of her test results from the GI office which came back normal. I discussed with her the possibility that her weight loss and abnormal CBC needs to be further looked at with a diagnostic bone marrow aspirate and biopsy. The patient will like to hold off and wait until after her last test which is scheduled for 07/03/2014. She will call me the day after her GI return appointment and if the result is unremarkable, she would be willing to pursue additional workup with bone marrow aspirate and biopsy.

## 2014-07-02 ENCOUNTER — Telehealth (HOSPITAL_COMMUNITY): Payer: Self-pay

## 2014-07-03 ENCOUNTER — Ambulatory Visit (HOSPITAL_COMMUNITY)
Admission: RE | Admit: 2014-07-03 | Discharge: 2014-07-03 | Disposition: A | Discharge status: Home / Self Care | Payer: 59 | Source: Ambulatory Visit | Attending: Gastroenterology | Admitting: Gastroenterology

## 2014-07-03 DIAGNOSIS — K760 Fatty (change of) liver, not elsewhere classified: Secondary | ICD-10-CM | POA: Insufficient documentation

## 2014-07-03 DIAGNOSIS — R7989 Other specified abnormal findings of blood chemistry: Secondary | ICD-10-CM | POA: Diagnosis present

## 2014-07-03 DIAGNOSIS — R945 Abnormal results of liver function studies: Secondary | ICD-10-CM

## 2014-08-06 ENCOUNTER — Other Ambulatory Visit: Payer: Self-pay | Admitting: Hematology and Oncology

## 2014-08-07 ENCOUNTER — Telehealth: Payer: Self-pay | Admitting: *Deleted

## 2014-08-07 ENCOUNTER — Telehealth: Payer: Self-pay | Admitting: Hematology and Oncology

## 2014-08-07 ENCOUNTER — Other Ambulatory Visit: Payer: Self-pay | Admitting: Hematology and Oncology

## 2014-08-07 DIAGNOSIS — D61818 Other pancytopenia: Secondary | ICD-10-CM

## 2014-08-07 NOTE — Telephone Encounter (Signed)
BM biopsy availabilities: 7/28, 7/29, 8/4 or 8/5 Please verify with BM path and SS and let me know which day so I can put new POF

## 2014-08-07 NOTE — Telephone Encounter (Signed)
Pt notified of BMBX to be done 08/16/14 @ 0800. To report to [facility] admitting at 0645. NPO after MN

## 2014-08-07 NOTE — Telephone Encounter (Signed)
Pt called back to say that she does want to go ahead with the BMBX instead of meeting with Dr Alvy Bimler on Monday. Wants to be sedated.

## 2014-08-07 NOTE — Telephone Encounter (Signed)
s.w. pt and advised on Aug appt.Marland KitchenMarland KitchenMarland KitchenMarland Kitchenpt ok and aware....pt now is now wanting to get procedure done.

## 2014-08-12 ENCOUNTER — Ambulatory Visit: Payer: 59 | Admitting: Hematology and Oncology

## 2014-08-12 ENCOUNTER — Other Ambulatory Visit: Payer: 59

## 2014-08-15 ENCOUNTER — Telehealth: Payer: Self-pay | Admitting: *Deleted

## 2014-08-15 NOTE — Telephone Encounter (Signed)
Reminded pt of her BMBx scheduled for tomorrow morning at [facility] Stay [facility].   NPO after midnight, arrive at 7 am and have a driver home.  She verbalized understanding.

## 2014-08-16 ENCOUNTER — Other Ambulatory Visit (HOSPITAL_COMMUNITY): Payer: Self-pay | Admitting: Hematology and Oncology

## 2014-08-16 ENCOUNTER — Encounter (HOSPITAL_COMMUNITY)
Admission: RE | Admit: 2014-08-16 | Discharge: 2014-08-16 | Disposition: A | Discharge status: Home / Self Care | Payer: Commercial Managed Care - HMO | Source: Ambulatory Visit | Attending: Hematology and Oncology | Admitting: Hematology and Oncology

## 2014-08-16 ENCOUNTER — Encounter (HOSPITAL_COMMUNITY): Payer: Self-pay

## 2014-08-16 VITALS — BP 130/62 | HR 76 | Temp 98.3°F | Resp 18 | Ht 68.0 in | Wt 180.0 lb

## 2014-08-16 DIAGNOSIS — D61818 Other pancytopenia: Secondary | ICD-10-CM | POA: Diagnosis not present

## 2014-08-16 HISTORY — DX: Other pancytopenia: D61.818

## 2014-08-16 LAB — COMPREHENSIVE METABOLIC PANEL
ALT: 26 U/L (ref 14–54)
AST: 40 U/L (ref 15–41)
Albumin: 3.5 g/dL (ref 3.5–5.0)
Alkaline Phosphatase: 153 U/L — ABNORMAL HIGH (ref 38–126)
Anion gap: 4 — ABNORMAL LOW (ref 5–15)
BUN: 27 mg/dL — ABNORMAL HIGH (ref 6–20)
CALCIUM: 9.5 mg/dL (ref 8.9–10.3)
CO2: 20 mmol/L — ABNORMAL LOW (ref 22–32)
CREATININE: 1.12 mg/dL — AB (ref 0.44–1.00)
Chloride: 114 mmol/L — ABNORMAL HIGH (ref 101–111)
GFR, EST NON AFRICAN AMERICAN: 53 mL/min — AB (ref >60)
GLUCOSE: 95 mg/dL (ref 65–99)
POTASSIUM: 3.2 mmol/L — AB (ref 3.5–5.1)
SODIUM: 138 mmol/L (ref 135–145)
TOTAL PROTEIN: 6.8 g/dL (ref 6.5–8.1)
Total Bilirubin: 0.7 mg/dL (ref 0.3–1.2)

## 2014-08-16 LAB — CBC WITH DIFFERENTIAL/PLATELET
BASOS PCT: 1 % (ref 0–1)
Basophils Absolute: 0 10*3/uL (ref 0.0–0.1)
Eosinophils Absolute: 0.3 10*3/uL (ref 0.0–0.7)
Eosinophils Relative: 8 % — ABNORMAL HIGH (ref 0–5)
HCT: 33 % — ABNORMAL LOW (ref 36.0–46.0)
Hemoglobin: 10.6 g/dL — ABNORMAL LOW (ref 12.0–15.0)
Lymphocytes Relative: 29 % (ref 12–46)
Lymphs Abs: 1.1 10*3/uL (ref 0.7–4.0)
MCH: 28.3 pg (ref 26.0–34.0)
MCHC: 32.1 g/dL (ref 30.0–36.0)
MCV: 88 fL (ref 78.0–100.0)
MONO ABS: 0.5 10*3/uL (ref 0.1–1.0)
Monocytes Relative: 12 % (ref 3–12)
NEUTROS ABS: 2 10*3/uL (ref 1.7–7.7)
NEUTROS PCT: 50 % (ref 43–77)
Platelets: 123 10*3/uL — ABNORMAL LOW (ref 150–400)
RBC: 3.75 MIL/uL — ABNORMAL LOW (ref 3.87–5.11)
RDW: 13.9 % (ref 11.5–15.5)
WBC: 3.9 10*3/uL — ABNORMAL LOW (ref 4.0–10.5)

## 2014-08-16 LAB — BONE MARROW EXAM

## 2014-08-16 MED ORDER — FENTANYL CITRATE (PF) 100 MCG/2ML IJ SOLN
100.0000 ug | Freq: Once | INTRAMUSCULAR | Status: DC
  Filled 2014-08-16: qty 2

## 2014-08-16 MED ORDER — MIDAZOLAM HCL 5 MG/ML IJ SOLN
10.0000 mg | Freq: Once | INTRAMUSCULAR | Status: DC
  Filled 2014-08-16: qty 2

## 2014-08-16 MED ORDER — SODIUM CHLORIDE 0.9 % IV SOLN
Freq: Once | INTRAVENOUS | Status: AC
  Administered 2014-08-16: 500 mL via INTRAVENOUS

## 2014-08-16 MED ORDER — FENTANYL CITRATE (PF) 100 MCG/2ML IJ SOLN
INTRAMUSCULAR | Status: AC | PRN
  Administered 2014-08-16 (×2): 25 ug via INTRAVENOUS

## 2014-08-16 MED ORDER — MIDAZOLAM HCL 5 MG/5ML IJ SOLN
INTRAMUSCULAR | Status: AC | PRN
  Administered 2014-08-16: 2 mg via INTRAVENOUS
  Administered 2014-08-16: 1 mg via INTRAVENOUS
  Administered 2014-08-16: 2 mg via INTRAVENOUS

## 2014-08-16 NOTE — Sedation Documentation (Signed)
Procedure ends. bandaid to right post iliac area. Pt lying quietly prone.

## 2014-08-16 NOTE — Procedures (Signed)
Brief examination was performed. ENT: adequate airway clearance Heart: regular rate and rhythm.No Murmurs Lungs: clear to auscultation, no wheezes, normal respiratory effort  American Society of Anesthesiologists ASA scale 1  Mallampati Score of 3  Bone Marrow Biopsy and Aspiration Procedure Note   Informed consent was obtained and potential risks including bleeding, infection and pain were reviewed with the patient. I verified that the patient has been fasting since midnight.  The patient's name, date of birth, identification, consent and allergies were verified prior to the start of procedure and time out was performed.  A total of 5 mg of IV Versed and 50 mcg of IV fentanyl were given.  The right posterior iliac crest was chosen as the site of biopsy.  The skin was prepped with Betadine solution.   8 cc of 1% lidocaine was used to provide local anaesthesia.   10 cc of bone marrow aspirate was obtained followed by 1 inch biopsy.   The procedure was tolerated well and there were no complications.  The patient was stable at the end of the procedure.  Specimens sent for flow cytometry, cytogenetics and additional studies.

## 2014-08-16 NOTE — Sedation Documentation (Signed)
Family updated as to patient's status.

## 2014-08-16 NOTE — Sedation Documentation (Signed)
After ambulation. Dressing remains CDI

## 2014-08-16 NOTE — Sedation Documentation (Signed)
Pt was placed in supine position for pressure on procedure stie

## 2014-08-16 NOTE — Sedation Documentation (Signed)
bandaid pad 3/4 saturated with blood. Replaced dressing with sterile 2x2 gauze and hypafix. Not oozing noted

## 2014-08-16 NOTE — Sedation Documentation (Signed)
Small spot of blood on bandaid pad on post iliac procedure area

## 2014-08-16 NOTE — Sedation Documentation (Signed)
Bandage CDI

## 2014-08-16 NOTE — Discharge Instructions (Signed)
Remove dressing in AM and may shower. Do not go in public places today.  You may resume your regular diet and home medications unless you have received other instructions from you Dr Alvy Bimler No driving or making important decisions x 24 hours If bleeding occurs at site hold pressure x 10 minutes . If bleeding continues contact Dr Alvy Bimler On a rare occasion you might have tingling in you leg after the procedure. If this continues contact Dr Alvy Bimler [phone] Contact Dr for any questions or concerns Bone Marrow Aspiration, Bone Marrow Biopsy Care After Read the instructions outlined below and refer to this sheet in the next few weeks. These discharge instructions provide you with general information on caring for yourself after you leave the hospital. Your caregiver may also give you specific instructions. While your treatment has been planned according to the most current medical practices available, unavoidable complications occasionally occur. If you have any problems or questions after discharge, call your caregiver. FINDING OUT THE RESULTS OF YOUR TEST Not all test results are available during your visit. If your test results are not back during the visit, make an appointment with your caregiver to find out the results. Do not assume everything is normal if you have not heard from your caregiver or the medical facility. It is important for you to follow up on all of your test results.  HOME CARE INSTRUCTIONS  You have had sedation and may be sleepy or dizzy. Your thinking may not be as clear as usual. For the next 24 hours:  Only take over-the-counter or prescription medicines for pain, discomfort, and or fever as directed by your caregiver.  Do not drink alcohol.  Do not smoke.  Do not drive.  Do not make important legal decisions.  Do not operate heavy machinery.  Do not care for small children by yourself.  Keep your dressing clean and dry. You may replace dressing with a  bandage after 24 hours.  You may take a bath or shower after 24 hours.  Use an ice pack for 20 minutes every 2 hours while awake for pain as needed. SEEK MEDICAL CARE IF:   There is redness, swelling, or increasing pain at the biopsy site.  There is pus coming from the biopsy site.  There is drainage from a biopsy site lasting longer than one day.  An unexplained oral temperature above 102 F (38.9 C) develops. SEEK IMMEDIATE MEDICAL CARE IF:   You develop a rash.  You have difficulty breathing.  You develop any reaction or side effects to medications given. Document Released: 07/17/2004 Document Revised: 03/22/2011 Document Reviewed: 12/26/2007 [facility] Patient Information 2015 [place], [place]. This information is not intended to replace advice given to you by your health care provider. Make sure you discuss any questions you have with your health care provider. Conscious Sedation, Adult, Care After Refer to this sheet in the next few weeks. These instructions provide you with information on caring for yourself after your procedure. Your health care provider may also give you more specific instructions. Your treatment has been planned according to current medical practices, but problems sometimes occur. Call your health care provider if you have any problems or questions after your procedure. WHAT TO EXPECT AFTER THE PROCEDURE  After your procedure:  You may feel sleepy, clumsy, and have poor balance for several hours.  Vomiting may occur if you eat too soon after the procedure. HOME CARE INSTRUCTIONS  Do not participate in any activities where you  could become injured for at least 24 hours. Do not:  Drive.  Swim.  Ride a bicycle.  Operate heavy machinery.  Cook.  Use power tools.  Climb ladders.  Work from a high place.  Do not make important decisions or sign legal documents until you are improved.  If you vomit, drink water, juice, or soup when you can drink  without vomiting. Make sure you have little or no nausea before eating solid foods.  Only take over-the-counter or prescription medicines for pain, discomfort, or fever as directed by your health care provider.  Make sure you and your family fully understand everything about the medicines given to you, including what side effects may occur.  You should not drink alcohol, take sleeping pills, or take medicines that cause drowsiness for at least 24 hours.  If you smoke, do not smoke without supervision.  If you are feeling better, you may resume normal activities 24 hours after you were sedated.  Keep all appointments with your health care provider. SEEK MEDICAL CARE IF:  Your skin is pale or bluish in color.  You continue to feel nauseous or vomit.  Your pain is getting worse and is not helped by medicine.  You have bleeding or swelling.  You are still sleepy or feeling clumsy after 24 hours. SEEK IMMEDIATE MEDICAL CARE IF:  You develop a rash.  You have difficulty breathing.  You develop any type of allergic problem.  You have a fever. MAKE SURE YOU:  Understand these instructions.  Will watch your condition.  Will get help right away if you are not doing well or get worse. Document Released: 10/18/2012 Document Reviewed: 10/18/2012 [facility] Patient Information 2015 [place], [place]. This information is not intended to replace advice given to you by your health care provider. Make sure you discuss any questions you have with your health care provider.

## 2014-08-16 NOTE — Sedation Documentation (Signed)
Medication dose calculated and verified for: Fentanyl 50 mcg IV and Versed 5 mg IV

## 2014-08-16 NOTE — Sedation Documentation (Signed)
Ambulated in room with no assist. No c/o

## 2014-08-21 ENCOUNTER — Telehealth: Payer: Self-pay | Admitting: *Deleted

## 2014-08-21 NOTE — Telephone Encounter (Signed)
Pt asking if Dr. Alvy Bimler has results of her BMBx from last week?

## 2014-08-22 ENCOUNTER — Telehealth: Payer: Self-pay | Admitting: *Deleted

## 2014-08-22 NOTE — Telephone Encounter (Signed)
Informed pt of prelim results BMBX do not show any cancer per Dr. Alvy Bimler.  She will review final results w/ her on next visit 8/18.  Pt verbalized understanding.

## 2014-08-22 NOTE — Telephone Encounter (Signed)
Prelim no cancer, awaiting more results

## 2014-08-23 LAB — CHROMOSOME ANALYSIS, BONE MARROW

## 2014-08-23 LAB — TISSUE HYBRIDIZATION (BONE MARROW)-NCBH

## 2014-08-29 ENCOUNTER — Ambulatory Visit (HOSPITAL_BASED_OUTPATIENT_CLINIC_OR_DEPARTMENT_OTHER): Payer: Commercial Managed Care - HMO | Admitting: Hematology and Oncology

## 2014-08-29 ENCOUNTER — Encounter: Payer: Self-pay | Admitting: Hematology and Oncology

## 2014-08-29 VITALS — BP 161/84 | HR 90 | Temp 98.1°F | Resp 18 | Ht 68.0 in

## 2014-08-29 DIAGNOSIS — D638 Anemia in other chronic diseases classified elsewhere: Secondary | ICD-10-CM | POA: Diagnosis not present

## 2014-08-29 DIAGNOSIS — D72819 Decreased white blood cell count, unspecified: Secondary | ICD-10-CM | POA: Diagnosis not present

## 2014-08-29 NOTE — Assessment & Plan Note (Signed)
Bone marrow biopsy was nonspecific. There were no features to support myelodysplastic syndrome. The patient's mother disclosed to me that she has concerns that the patient might be poisoned. I have no way to prove that. I recommend the patient to take a vacation to visit her sister in [place] for 4-6 weeks and then I can repeat her blood work after she returns. Presumably, if the patient was indeed poisoned somehow, a period of time away from home should allow adequate time for washout The patient will call me after her vacation so I can make return appointment and recheck

## 2014-08-29 NOTE — Assessment & Plan Note (Signed)
She is not symptomatic. Bone marrow biopsy was negative for myelodysplastic syndrome. Iron store is reduce in the bone marrow and I recommend she take an over-the-counter prenatal vitamin supplement

## 2014-08-29 NOTE — Progress Notes (Signed)
[name], MD SUMMARY OF HEMATOLOGIC HISTORY:  She was found to have abnormal CBC from routine blood work. On review of her blood work since 2012, her platelet count fluctuated from 87,000 to 119,000. She complains of easy bruising. The patient denies history of liver disease, exposure to heparin, history of cardiac murmur/prior cardiovascular surgery or recent new medications. Blood work including screening for autoimmune disease, nutritional deficiencies and viral studies in 2015 were negative She has mild abnormal liver function tests. Ultrasound shows some abnormal lesions. MRI showed kidney cyst and liver cysts On August 5th 2016,  She has bone marrow aspirate and biopsy that came back negative for myelodysplastic syndrome INTERVAL HISTORY: Dana Watkins 59 y.o. female returns for further follow-up. She feels well. The patient denies any recent signs or symptoms of bleeding such as spontaneous epistaxis, hematuria or hematochezia.   I have reviewed the past medical history, past surgical history, social history and family history with the patient and they are unchanged from previous note.  ALLERGIES:  is allergic to sulfa antibiotics.  MEDICATIONS:  No current outpatient prescriptions on file.   No current facility-administered medications for this visit.     REVIEW OF SYSTEMS:   Constitutional: Denies fevers, chills or night sweats Eyes: Denies blurriness of vision Ears, nose, mouth, throat, and face: Denies mucositis or sore throat Respiratory: Denies cough, dyspnea or wheezes Cardiovascular: Denies palpitation, chest discomfort or lower extremity swelling Gastrointestinal:  Denies nausea, heartburn or change in bowel habits Skin: Denies abnormal skin rashes Lymphatics: Denies new lymphadenopathy or easy bruising Neurological:Denies numbness, tingling or new weaknesses Behavioral/Psych: Mood is stable, no new  changes  All other systems were reviewed with the patient and are negative.  PHYSICAL EXAMINATION: ECOG PERFORMANCE STATUS: 0 - Asymptomatic  Filed Vitals:   08/29/14 1345  BP: 161/84  Pulse: 90  Temp: 98.1 F (36.7 C)  Resp: 18   Filed Weights    GENERAL:alert, no distress and comfortable SKIN: skin color, texture, turgor are normal, no rashes or significant lesions EYES: normal, Conjunctiva are pink and non-injected, sclera clear Musculoskeletal:no cyanosis of digits and no clubbing  NEURO: alert & oriented x 3 with fluent speech, no focal motor/sensory deficits  LABORATORY DATA:  I have reviewed the data as listed No results found for this or any previous visit (from the past 48 hour(s)).  Lab Results  Component Value Date   WBC 3.9* 08/16/2014   HGB 10.6* 08/16/2014   HCT 33.0* 08/16/2014   MCV 88.0 08/16/2014   PLT 123* 08/16/2014    ASSESSMENT & PLAN:  Chronic leukopenia Bone marrow biopsy was nonspecific. There were no features to support myelodysplastic syndrome. The patient's mother disclosed to me that she has concerns that the patient might be poisoned. I have no way to prove that. I recommend the patient to take a vacation to visit her sister in [place] for 4-6 weeks and then I can repeat her blood work after she returns. Presumably, if the patient was indeed poisoned somehow, a period of time away from home should allow adequate time for washout The patient will call me after her vacation so I can make return appointment and recheck   Anemia of chronic illness  She is not symptomatic. Bone marrow biopsy was negative for myelodysplastic syndrome. Iron store is reduce in the bone marrow and I recommend she take an over-the-counter prenatal vitamin supplement   All questions were answered. The patient knows  to call the clinic with any problems, questions or concerns. No barriers to learning was detected.  I spent 15 minutes counseling the patient face to  face. The total time spent in the appointment was 20 minutes and more than 50% was on counseling.     [facility], Alyric Parkin, MD 8/18/20164:15 PM

## 2014-08-30 ENCOUNTER — Encounter (HOSPITAL_COMMUNITY): Payer: Self-pay

## 2014-11-04 ENCOUNTER — Telehealth: Payer: Self-pay | Admitting: Hematology and Oncology

## 2014-11-04 NOTE — Telephone Encounter (Signed)
returned call and lvm for pt to call back to sched sooner appt

## 2014-11-05 ENCOUNTER — Telehealth: Payer: Self-pay | Admitting: Hematology and Oncology

## 2014-11-05 NOTE — Telephone Encounter (Signed)
returned call and s.w.pt and confirmed NOV appt....pt ok and aware

## 2014-11-11 ENCOUNTER — Telehealth: Payer: Self-pay | Admitting: Hematology and Oncology

## 2014-11-11 ENCOUNTER — Telehealth: Payer: Self-pay | Admitting: *Deleted

## 2014-11-11 ENCOUNTER — Other Ambulatory Visit: Payer: Self-pay | Admitting: Hematology and Oncology

## 2014-11-11 DIAGNOSIS — D696 Thrombocytopenia, unspecified: Secondary | ICD-10-CM

## 2014-11-11 NOTE — Telephone Encounter (Signed)
I ordered B12 and lab appt along with others on 11/14

## 2014-11-11 NOTE — Telephone Encounter (Signed)
Pt left message stating she would like to have labs drawn prior to seeing Dr Alvy Bimler on 11/15. Is requesting pernicious anemia test.

## 2014-11-11 NOTE — Telephone Encounter (Signed)
per pof to sch pt appt-cld * talkwec w/pt and gave pt time &date of appt for 11/14

## 2014-11-25 ENCOUNTER — Telehealth: Payer: Self-pay | Admitting: *Deleted

## 2014-11-25 ENCOUNTER — Other Ambulatory Visit (HOSPITAL_BASED_OUTPATIENT_CLINIC_OR_DEPARTMENT_OTHER): Payer: Commercial Managed Care - HMO

## 2014-11-25 DIAGNOSIS — D696 Thrombocytopenia, unspecified: Secondary | ICD-10-CM

## 2014-11-25 DIAGNOSIS — R7989 Other specified abnormal findings of blood chemistry: Secondary | ICD-10-CM

## 2014-11-25 DIAGNOSIS — R945 Abnormal results of liver function studies: Principal | ICD-10-CM

## 2014-11-25 LAB — COMPREHENSIVE METABOLIC PANEL (CC13)
ALT: 21 U/L (ref 0–55)
AST: 39 U/L — AB (ref 5–34)
Albumin: 3.4 g/dL — ABNORMAL LOW (ref 3.5–5.0)
Alkaline Phosphatase: 193 U/L — ABNORMAL HIGH (ref 40–150)
Anion Gap: 8 mEq/L (ref 3–11)
BUN: 16.4 mg/dL (ref 7.0–26.0)
CHLORIDE: 111 meq/L — AB (ref 98–109)
CO2: 22 mEq/L (ref 22–29)
Calcium: 10.4 mg/dL (ref 8.4–10.4)
Creatinine: 1.4 mg/dL — ABNORMAL HIGH (ref 0.6–1.1)
EGFR: 41 mL/min/{1.73_m2} — ABNORMAL LOW (ref >90)
GLUCOSE: 113 mg/dL (ref 70–140)
POTASSIUM: 2.7 meq/L — AB (ref 3.5–5.1)
SODIUM: 141 meq/L (ref 136–145)
Total Bilirubin: 0.76 mg/dL (ref 0.20–1.20)
Total Protein: 6.8 g/dL (ref 6.4–8.3)

## 2014-11-25 LAB — CBC WITH DIFFERENTIAL/PLATELET
BASO%: 0.9 % (ref 0.0–2.0)
BASOS ABS: 0 10*3/uL (ref 0.0–0.1)
EOS%: 3.1 % (ref 0.0–7.0)
Eosinophils Absolute: 0.1 10*3/uL (ref 0.0–0.5)
HCT: 38.5 % (ref 34.8–46.6)
HGB: 12.6 g/dL (ref 11.6–15.9)
LYMPH%: 32 % (ref 14.0–49.7)
MCH: 28.7 pg (ref 25.1–34.0)
MCHC: 32.7 g/dL (ref 31.5–36.0)
MCV: 87.9 fL (ref 79.5–101.0)
MONO#: 0.4 10*3/uL (ref 0.1–0.9)
MONO%: 9.8 % (ref 0.0–14.0)
NEUT#: 2.1 10*3/uL (ref 1.5–6.5)
NEUT%: 54.2 % (ref 38.4–76.8)
Platelets: 105 10*3/uL — ABNORMAL LOW (ref 145–400)
RBC: 4.38 10*6/uL (ref 3.70–5.45)
RDW: 15.9 % — AB (ref 11.2–14.5)
WBC: 3.8 10*3/uL — ABNORMAL LOW (ref 3.9–10.3)
lymph#: 1.2 10*3/uL (ref 0.9–3.3)

## 2014-11-25 LAB — PROTIME-INR
INR: 1.1 — AB (ref 2.00–3.50)
PROTIME: 13.2 s (ref 10.6–13.4)

## 2014-11-25 LAB — MORPHOLOGY: PLT EST: DECREASED

## 2014-11-25 MED ORDER — POTASSIUM CHLORIDE CRYS ER 20 MEQ PO TBCR: 20.0000 meq | EXTENDED_RELEASE_TABLET | Freq: Two times a day (BID) | ORAL | Status: DC

## 2014-11-25 NOTE — Telephone Encounter (Signed)
Pt instructed to take KCL 20 meq BID X 7 days. To see Dr Alvy Bimler tomorrow

## 2014-11-26 ENCOUNTER — Ambulatory Visit (HOSPITAL_BASED_OUTPATIENT_CLINIC_OR_DEPARTMENT_OTHER): Payer: Commercial Managed Care - HMO | Admitting: Hematology and Oncology

## 2014-11-26 ENCOUNTER — Encounter: Payer: Self-pay | Admitting: Hematology and Oncology

## 2014-11-26 VITALS — BP 152/86 | HR 79 | Temp 98.1°F | Resp 18 | Ht 68.0 in | Wt 179.4 lb

## 2014-11-26 DIAGNOSIS — D696 Thrombocytopenia, unspecified: Secondary | ICD-10-CM

## 2014-11-26 DIAGNOSIS — D72819 Decreased white blood cell count, unspecified: Secondary | ICD-10-CM | POA: Diagnosis not present

## 2014-11-26 DIAGNOSIS — N179 Acute kidney failure, unspecified: Secondary | ICD-10-CM | POA: Insufficient documentation

## 2014-11-26 DIAGNOSIS — E876 Hypokalemia: Secondary | ICD-10-CM | POA: Diagnosis not present

## 2014-11-26 DIAGNOSIS — R635 Abnormal weight gain: Secondary | ICD-10-CM

## 2014-11-26 DIAGNOSIS — R748 Abnormal levels of other serum enzymes: Secondary | ICD-10-CM

## 2014-11-26 HISTORY — DX: Hypokalemia: E87.6

## 2014-11-26 LAB — APTT: APTT: 32 s (ref 24–37)

## 2014-11-26 LAB — VITAMIN B12: VITAMIN B 12: 465 pg/mL (ref 211–911)

## 2014-11-26 NOTE — Assessment & Plan Note (Signed)
The cause is unknown. I recommend nephrology consultation and increase oral fluid intake.

## 2014-11-26 NOTE — Assessment & Plan Note (Signed)
She is not taking any medications. She denies recent diarrhea. The cause is unknown. I recommend nephrology consultation. Thank you for potassium replacement therapy for 1 week and recommend close follow-up with PCP

## 2014-11-26 NOTE — Assessment & Plan Note (Signed)
She has chronic elevated alkaline phosphatase level with nonspecific elevated AST.  I have referred her to see gastroenterologist and she had liver ultrasound. I spoken with her gastroenterologist many times and she felt that this is not compatible with liver disease. Certainly, these results are nonspecific and abnormal bone turn over over or kidney disease can also cause elevated alkaline phosphatase level.  I recommend observation only for now.

## 2014-11-26 NOTE — Progress Notes (Signed)
[place], [place] SUMMARY OF HEMATOLOGIC HISTORY:  She was found to have abnormal CBC from routine blood work. On review of her blood work since 2012, her platelet count fluctuated from 87,000 to 119,000. She complains of easy bruising. The patient denies history of liver disease, exposure to heparin, history of cardiac murmur/prior cardiovascular surgery or recent new medications. Blood work including screening for autoimmune disease, nutritional deficiencies and viral studies in 2015 were negative She has mild abnormal liver function tests. Ultrasound shows some abnormal lesions. MRI showed kidney cyst and liver cysts On August 5th 2016,  She has bone marrow aspirate and biopsy that came back negative for myelodysplastic syndrome INTERVAL HISTORY: Dana Watkins 59 y.o. female returns for  Further follow-up. She complain of excessive fatigue. She has progressive weight gain. She denies recent infection. The patient denies any recent signs or symptoms of bleeding such as spontaneous epistaxis, hematuria or hematochezia.  I have reviewed the past medical history, past surgical history, social history and family history with the patient and they are unchanged from previous note.  ALLERGIES:  is allergic to sulfa antibiotics.  MEDICATIONS:  Current Outpatient Prescriptions  Medication Sig Dispense Refill  . potassium chloride SA (K-DUR,KLOR-CON) 20 MEQ tablet Take 1 tablet (20 mEq total) by mouth 2 (two) times daily. Take for 7 days 14 tablet 0   No current facility-administered medications for this visit.     REVIEW OF SYSTEMS:   Constitutional: Denies fevers, chills or night sweats Eyes: Denies blurriness of vision Ears, nose, mouth, throat, and face: Denies mucositis or sore throat Respiratory: Denies cough, dyspnea or wheezes Cardiovascular: Denies palpitation, chest discomfort or lower extremity swelling Gastrointestinal:   Denies nausea, heartburn or change in bowel habits Skin: Denies abnormal skin rashes Lymphatics: Denies new lymphadenopathy or easy bruising Neurological:Denies numbness, tingling or new weaknesses Behavioral/Psych: Mood is stable, no new changes  All other systems were reviewed with the patient and are negative.  PHYSICAL EXAMINATION: ECOG PERFORMANCE STATUS: 1 - Symptomatic but completely ambulatory  Filed Vitals:   11/26/14 1303  BP: 152/86  Pulse: 79  Temp: 98.1 F (36.7 C)  Resp: 18   Filed Weights   11/26/14 1303  Weight: 179 lb 6.4 oz (81.375 kg)    GENERAL:alert, no distress and comfortable SKIN: skin color, texture, turgor are normal, no rashes or significant lesions EYES: normal, Conjunctiva are pink and non-injected, sclera clear OROPHARYNX:no exudate, no erythema and lips, buccal mucosa, and tongue normal  NECK: supple, thyroid normal size, non-tender, without nodularity LYMPH:  no palpable lymphadenopathy in the cervical, axillary or inguinal LUNGS: clear to auscultation and percussion with normal breathing effort HEART: regular rate & rhythm and no murmurs and no lower extremity edema ABDOMEN:abdomen soft, non-tender and normal bowel sounds Musculoskeletal:no cyanosis of digits and no clubbing  NEURO: alert & oriented x 3 with fluent speech, no focal motor/sensory deficits  LABORATORY DATA:  I have reviewed the data as listed Results for orders placed or performed in visit on 11/25/14 (from the past 48 hour(s))  CBC with Differential/Platelet     Status: Abnormal   Collection Time: 11/25/14  3:32 PM  Result Value Ref Range   WBC 3.8 (L) 3.9 - 10.3 10e3/uL   NEUT# 2.1 1.5 - 6.5 10e3/uL   HGB 12.6 11.6 - 15.9 g/dL   HCT 38.5 34.8 - 46.6 %   Platelets 105 (L) 145 - 400 10e3/uL   MCV 87.9 79.5 -  101.0 fL   MCH 28.7 25.1 - 34.0 pg   MCHC 32.7 31.5 - 36.0 g/dL   RBC 4.38 3.70 - 5.45 10e6/uL   RDW 15.9 (H) 11.2 - 14.5 %   lymph# 1.2 0.9 - 3.3 10e3/uL   MONO#  0.4 0.1 - 0.9 10e3/uL   Eosinophils Absolute 0.1 0.0 - 0.5 10e3/uL   Basophils Absolute 0.0 0.0 - 0.1 10e3/uL   NEUT% 54.2 38.4 - 76.8 %   LYMPH% 32.0 14.0 - 49.7 %   MONO% 9.8 0.0 - 14.0 %   EOS% 3.1 0.0 - 7.0 %   BASO% 0.9 0.0 - 2.0 %  Comprehensive metabolic panel     Status: Abnormal   Collection Time: 11/25/14  3:32 PM  Result Value Ref Range   Sodium 141 136 - 145 mEq/L   Potassium 2.7 (LL) 3.5 - 5.1 mEq/L   Chloride 111 (H) 98 - 109 mEq/L   CO2 22 22 - 29 mEq/L   Glucose 113 70 - 140 mg/dl    Comment: Glucose reference range is for nonfasting patients. Fasting glucose reference range is 70- 100.   BUN 16.4 7.0 - 26.0 mg/dL   Creatinine 1.4 (H) 0.6 - 1.1 mg/dL   Total Bilirubin 0.76 0.20 - 1.20 mg/dL   Alkaline Phosphatase 193 (H) 40 - 150 U/L   AST 39 (H) 5 - 34 U/L   ALT 21 0 - 55 U/L   Total Protein 6.8 6.4 - 8.3 g/dL   Albumin 3.4 (L) 3.5 - 5.0 g/dL   Calcium 10.4 8.4 - 10.4 mg/dL   Anion Gap 8 3 - 11 mEq/L   EGFR 41 (L) >90 ml/min/1.73 m2    Comment: eGFR is calculated using the CKD-EPI Creatinine Equation (2009)  Morphology     Status: None   Collection Time: 11/25/14  3:32 PM  Result Value Ref Range   Polychromasia Slight Slight   White Cell Comments C/W auto diff    PLT EST Decreased Adequate  APTT     Status: None   Collection Time: 11/25/14  3:32 PM  Result Value Ref Range   aPTT 32 24 - 37 seconds    Comment: This test is for screening purposes only; it should not be used fortherapeutic unfractionated heparin monitoring.  Please refer toHeparin Anti-Xa (38466).  Protime-INR     Status: Abnormal   Collection Time: 11/25/14  3:32 PM  Result Value Ref Range   Protime 13.2 10.6 - 13.4 Seconds   INR 1.10 (L) 2.00 - 3.50    Comment: INR is useful only to assess adequacy of anticoagulation with coumadin when comparing results from different labs. It should not be used to estimate bleeding risk or presence/abscense of coagulopathy in patients not on coumadin.  Expected INR ranges for  nontherapeutic patients is 0.88 - 1.12.    Lovenox No   Vitamin B12     Status: None   Collection Time: 11/25/14  3:32 PM  Result Value Ref Range   Vitamin B-12 465 211 - 911 pg/mL    Lab Results  Component Value Date   WBC 3.8* 11/25/2014   HGB 12.6 11/25/2014   HCT 38.5 11/25/2014   MCV 87.9 11/25/2014   PLT 105* 11/25/2014   ASSESSMENT & PLAN:  Thrombocytopenia ([place]) Cause is unknown Bone marrow biopsy in August in unrevealing Screening test for autoimmune disorder, hepatitis and HIV are negative At present level, she does not need transfusion. Will continue close observation  Chronic leukopenia Bone marrow biopsy was nonspecific. There were no features to support myelodysplastic syndrome. The patient's mother disclosed to me that she has concerns that the patient might be poisoned. I have no way to prove that. I recommend the patient to take a vacation to visit her sister in [place] for 4-6 weeks and then I can repeat her blood work after she returns. She did not go to [place] as suggested Presumably, if the patient was indeed poisoned somehow, a period of time away from home should allow adequate time for washout At present time, I would observe only. She is not symptomatic with recurrent infections  Acute hypokalemia  She is not taking any medications. She denies recent diarrhea. The cause is unknown. I recommend nephrology consultation. Thank you for potassium replacement therapy for 1 week and recommend close follow-up with PCP  Acute renal failure ([place])  The cause is unknown. I recommend nephrology consultation and increase oral fluid intake.  Elevated alkaline phosphatase level  She has chronic elevated alkaline phosphatase level with nonspecific elevated AST.  I have referred her to see gastroenterologist and she had liver ultrasound. I spoken with her gastroenterologist many times and she felt that this is not compatible with  liver disease. Certainly, these results are nonspecific and abnormal bone turn over over or kidney disease can also cause elevated alkaline phosphatase level.  I recommend observation only for now.  Abnormal weight gain  She has excessive weight gain of unknown etiology. She is significantly tired and unable to work. TSH last year was within normal limits I do not know the cause of her excessive fatigue and weight gain and recommend follow-up with primary care doctor for further evaluation and management.   All questions were answered. The patient knows to call the clinic with any problems, questions or concerns. No barriers to learning was detected.  I spent 20 minutes counseling the patient face to face. The total time spent in the appointment was 25 minutes and more than 50% was on counseling.     Chazz Philson, MD 11/15/20161:32 PM

## 2014-11-26 NOTE — Assessment & Plan Note (Signed)
Bone marrow biopsy was nonspecific. There were no features to support myelodysplastic syndrome. The patient's mother disclosed to me that she has concerns that the patient might be poisoned. I have no way to prove that. I recommend the patient to take a vacation to visit her sister in [place] for 4-6 weeks and then I can repeat her blood work after she returns. She did not go to [place] as suggested Presumably, if the patient was indeed poisoned somehow, a period of time away from home should allow adequate time for washout At present time, I would observe only. She is not symptomatic with recurrent infections

## 2014-11-26 NOTE — Assessment & Plan Note (Signed)
Cause is unknown Bone marrow biopsy in August in unrevealing Screening test for autoimmune disorder, hepatitis and HIV are negative At present level, she does not need transfusion. Will continue close observation

## 2014-11-26 NOTE — Assessment & Plan Note (Addendum)
She has excessive weight gain of unknown etiology. She is significantly tired and unable to work. TSH last year was within normal limits I do not know the cause of her excessive fatigue and weight gain and recommend follow-up with primary care doctor for further evaluation and management.

## 2014-12-09 ENCOUNTER — Telehealth: Payer: Self-pay | Admitting: *Deleted

## 2014-12-09 NOTE — Telephone Encounter (Signed)
LM for patient to call and let us know how she is doing. Labs better.

## 2015-05-15 DIAGNOSIS — G43909 Migraine, unspecified, not intractable, without status migrainosus: Secondary | ICD-10-CM

## 2015-05-15 DIAGNOSIS — I10 Essential (primary) hypertension: Secondary | ICD-10-CM | POA: Insufficient documentation

## 2015-05-15 DIAGNOSIS — M1990 Unspecified osteoarthritis, unspecified site: Secondary | ICD-10-CM | POA: Insufficient documentation

## 2015-05-15 HISTORY — DX: Migraine, unspecified, not intractable, without status migrainosus: G43.909

## 2015-05-15 HISTORY — DX: Essential (primary) hypertension: I10

## 2015-05-15 HISTORY — DX: Unspecified osteoarthritis, unspecified site: M19.90

## 2015-05-27 ENCOUNTER — Other Ambulatory Visit: Payer: 59

## 2015-05-27 ENCOUNTER — Ambulatory Visit: Payer: 59 | Admitting: Hematology and Oncology

## 2015-10-07 ENCOUNTER — Encounter (HOSPITAL_COMMUNITY): Payer: Self-pay

## 2015-10-07 DIAGNOSIS — Z79899 Other long term (current) drug therapy: Secondary | ICD-10-CM | POA: Insufficient documentation

## 2015-10-07 DIAGNOSIS — Z87891 Personal history of nicotine dependence: Secondary | ICD-10-CM | POA: Insufficient documentation

## 2015-10-07 DIAGNOSIS — R51 Headache: Secondary | ICD-10-CM | POA: Insufficient documentation

## 2015-10-07 NOTE — ED Triage Notes (Signed)
Pt BIB EMS from home. Pt at baseline per husband to EMS. Hx of bipolar and pancytopenia. Pt complaining of frontal migraine x 30 mins. Pt endorses drinking wine this evening. Ambulatory. A&Ox4.

## 2015-10-08 ENCOUNTER — Emergency Department (HOSPITAL_COMMUNITY)
Admission: EM | Admit: 2015-10-08 | Discharge: 2015-10-08 | Disposition: A | Discharge status: Home / Self Care | Payer: BLUE CROSS/BLUE SHIELD | Attending: Emergency Medicine | Admitting: Emergency Medicine

## 2015-10-08 DIAGNOSIS — R519 Headache, unspecified: Secondary | ICD-10-CM

## 2015-10-08 DIAGNOSIS — R51 Headache: Secondary | ICD-10-CM

## 2015-10-08 MED ORDER — KETOROLAC TROMETHAMINE 30 MG/ML IJ SOLN
30.0000 mg | Freq: Once | INTRAMUSCULAR | Status: AC
  Administered 2015-10-08: 30 mg via INTRAVENOUS
  Filled 2015-10-08: qty 1

## 2015-10-08 MED ORDER — DIPHENHYDRAMINE HCL 50 MG/ML IJ SOLN
25.0000 mg | Freq: Once | INTRAMUSCULAR | Status: AC
  Administered 2015-10-08: 25 mg via INTRAVENOUS
  Filled 2015-10-08: qty 1

## 2015-10-08 MED ORDER — SODIUM CHLORIDE 0.9 % IV BOLUS (SEPSIS)
1000.0000 mL | Freq: Once | INTRAVENOUS | Status: AC
  Administered 2015-10-08: 1000 mL via INTRAVENOUS

## 2015-10-08 NOTE — ED Provider Notes (Signed)
[facility] DEPT Provider Note   CSN: [id number] Arrival date & time: 10/07/15  2343     History   Chief Complaint Chief Complaint  Patient presents with  . Migraine    HPI Dana Watkins is a 60 y.o. female.  HPI   60 year old female with history of bipolar and pancytopenia brought here via EMS from home with complaints of headache. Patient states she has history of migraine headache in which she normally has headaches at least twice a month. The headaches usually improves when she takes her home medication, butalbital. Tonight while watching TV and drinking one with the sister she developed the same headache that she normally developed. She described the pain as a throbbing sensation to her forehead with light and sound sensitivity and feeling nauseous. Headache is gradual onset, persistent, and is moderate in intensity. Headache feels similar to prior headaches, however not improve after taking her butalbital medication. She denies any associated fever, chills, diplopia, URI symptoms, neck stiffness, chest pain, shortness of breath, focal numbness or weakness, confusion, or rash.  Past Medical History:  Diagnosis Date  . Acute hypokalemia 11/26/2014  . Pancytopenia [facility])     Patient Active Problem List   Diagnosis Date Noted  . Acute hypokalemia 11/26/2014  . Acute renal failure ([place]) 11/26/2014  . Elevated alkaline phosphatase level 11/26/2014  . Abnormal weight gain 11/26/2014  . Chronic leukopenia 05/28/2014  . Anemia of chronic illness 05/28/2014  . Thrombocytopenia ([place]) 04/30/2013  . Elevated liver function tests 04/30/2013    Past Surgical History:  Procedure Laterality Date  . CESAREAN SECTION     24 years ago  . tonsils and adneoids     as a child    OB History    No data available       Home Medications    Prior to Admission medications   Medication Sig Start Date End Date Taking? Authorizing Provider  potassium chloride SA (K-DUR,KLOR-CON)  20 MEQ tablet Take 1 tablet (20 mEq total) by mouth 2 (two) times daily. Take for 7 days 11/25/14   Heath Lark, MD    Family History History reviewed. No pertinent family history.  Social History Social History  Substance Use Topics  . Smoking status: Former Smoker    Years: 2.00    Start date: 06/25/1968    Quit date: 05/16/2014  . Smokeless tobacco: Former Systems developer    Quit date: 05/16/2014  . Alcohol use 0.6 oz/week    1 Glasses of wine per week     Allergies   Sulfa antibiotics   Review of Systems Review of Systems  All other systems reviewed and are negative.    Physical Exam Updated Vital Signs BP 128/81 (BP Location: Right Arm)   Pulse 75   Temp 98.5 F (36.9 C) (Oral)   Resp 18   SpO2 96%   Physical Exam  Constitutional: She is oriented to person, place, and time. She appears well-developed and well-nourished. No distress.  HENT:  Head: Atraumatic.  Right Ear: External ear normal.  Left Ear: External ear normal.  Mouth/Throat: Oropharynx is clear and moist.  Eyes: Conjunctivae and EOM are normal. Pupils are equal, round, and reactive to light.  Neck: Normal range of motion. Neck supple.  No nuchal rigidity  Cardiovascular: Normal rate and regular rhythm.   Pulmonary/Chest: Effort normal and breath sounds normal.  Abdominal: Soft. There is no tenderness.  Neurological: She is alert and oriented to person, place, and time. She has normal  strength. She is not disoriented. No cranial nerve deficit or sensory deficit. She displays a negative Romberg sign. Coordination and gait normal. GCS eye subscore is 4. GCS verbal subscore is 5. GCS motor subscore is 6.  Skin: No rash noted.  Psychiatric: She has a normal mood and affect.  Nursing note and vitals reviewed.    ED Treatments / Results  Labs (all labs ordered are listed, but only abnormal results are displayed) Labs Reviewed - No data to display  EKG  EKG Interpretation None       Radiology No results  found.  Procedures Procedures (including critical care time)  Medications Ordered in ED Medications - No data to display   Initial Impression / Assessment and Plan / ED Course  I have reviewed the triage vital signs and the nursing notes.  Pertinent labs & imaging results that were available during my care of the patient were reviewed by me and considered in my medical decision making (see chart for details).  Clinical Course    BP 130/84 (BP Location: Right Arm)   Pulse 70   Temp 98.6 F (37 C) (Oral)   Resp 20   SpO2 99%    Final Clinical Impressions(s) / ED Diagnoses   Final diagnoses:  Bad headache    New Prescriptions New Prescriptions   No medications on file   Headache similar to previous, no fever, neck stiffness, neuro findings or new symptoms to suggest more serious etiology.  I don't think SAH, ICH, meningitis, encephalitis, mass at this time.  No recent trauma.  I don't feel imaging necessary at this time.  Plan to control symptoms.   5:32 AM Pt felt much better after receiving migraine cocktail.  She feels safe going home.  Pt understand to return if headache worsen. Recommend f/;u with pcp for further care.  Continue with her home medication butalbital.     Domenic Moras, PA-C 53/29/92 [id number]    Delora Fuel, MD 34/19/62 [id number]

## 2016-03-19 ENCOUNTER — Encounter: Payer: Self-pay | Admitting: *Deleted

## 2016-03-22 ENCOUNTER — Telehealth: Payer: Self-pay | Admitting: *Deleted

## 2016-03-22 ENCOUNTER — Ambulatory Visit: Payer: BLUE CROSS/BLUE SHIELD | Admitting: Diagnostic Neuroimaging

## 2016-03-22 NOTE — Telephone Encounter (Signed)
Spoke with patient and advised this office will close at 12 noon today due to bad weather. Rescheduled for fisrt afternoon available at patient's request. She verbalized understanding, appreciation.

## 2016-04-06 ENCOUNTER — Ambulatory Visit: Payer: Self-pay | Admitting: Diagnostic Neuroimaging

## 2016-04-19 ENCOUNTER — Encounter: Payer: Self-pay | Admitting: *Deleted

## 2016-04-20 ENCOUNTER — Encounter: Payer: Self-pay | Admitting: Diagnostic Neuroimaging

## 2016-04-20 ENCOUNTER — Ambulatory Visit (INDEPENDENT_AMBULATORY_CARE_PROVIDER_SITE_OTHER): Payer: BLUE CROSS/BLUE SHIELD | Admitting: Diagnostic Neuroimaging

## 2016-04-20 VITALS — BP 139/87 | HR 79 | Ht 68.0 in | Wt 154.6 lb

## 2016-04-20 DIAGNOSIS — R7989 Other specified abnormal findings of blood chemistry: Secondary | ICD-10-CM | POA: Diagnosis not present

## 2016-04-20 DIAGNOSIS — R9089 Other abnormal findings on diagnostic imaging of central nervous system: Secondary | ICD-10-CM | POA: Diagnosis not present

## 2016-04-20 DIAGNOSIS — R5382 Chronic fatigue, unspecified: Secondary | ICD-10-CM

## 2016-04-20 DIAGNOSIS — D696 Thrombocytopenia, unspecified: Secondary | ICD-10-CM

## 2016-04-20 DIAGNOSIS — R945 Abnormal results of liver function studies: Secondary | ICD-10-CM

## 2016-04-20 NOTE — Patient Instructions (Signed)
Thank you for coming to see us at [facility]. I hope we have been able to provide you high quality care today.  You may receive a patient satisfaction survey over the next few weeks. We would appreciate your feedback and comments so that we may continue to improve ourselves and the health of our patients.  - follow up with liver specialist to better define the fatty liver disease and liver function abnormalities  - consider sleep study  - no further neurologic testing advised   ~~~~~~~~~~~~~~~~~~~~~~~~~~~~~~~~~~~~~~~~~~~~~~~~~~~~~~~~~~~~~~~~~  DR. PENUMALLI'S GUIDE TO HAPPY AND HEALTHY LIVING These are some of my general health and wellness recommendations. Some of them may apply to you better than others. Please use common sense as you try these suggestions and feel free to ask me any questions.   ACTIVITY/FITNESS Mental, social, emotional and physical stimulation are very important for brain and body health. Try learning a new activity (arts, music, language, sports, games).  Keep moving your body to the best of your abilities. You can do this at home, inside or outside, the park, community center, gym or anywhere you like. Consider a physical therapist or personal trainer to get started. Consider the app Sworkit. Fitness trackers such as smart-watches, smart-phones or Fitbits can help as well.   NUTRITION Eat more plants: colorful vegetables, nuts, seeds and berries.  Eat less sugar, salt, preservatives and processed foods.  Avoid toxins such as cigarettes and alcohol.  Drink water when you are thirsty. Warm water with a slice of lemon is an excellent morning drink to start the day.  Consider these websites for more information The Nutrition Source (https://www.hsph.[employer].edu/nutritionsource) Precision Nutrition (www.precisionnutrition.com/blog/infographics)   RELAXATION Consider practicing mindfulness meditation or other relaxation techniques such as  deep breathing, prayer, yoga, tai chi, massage. See website mindful.org or the apps Headspace or Calm to help get started.   SLEEP Try to get at least 7-8+ hours sleep per day. Regular exercise and reduced caffeine will help you sleep better. Practice good sleep hygeine techniques. See website sleep.org for more information.   PLANNING Prepare estate planning, living will, healthcare POA documents. Sometimes this is best planned with the help of an attorney. Theconversationproject.org and agingwithdignity.org are excellent resources.  

## 2016-04-20 NOTE — Progress Notes (Signed)
[facility]  PATIENT: Dana Watkins DOB: December 03, 1955  REFERRING CLINICIAN: C A Ross HISTORY FROM: patient  REASON FOR VISIT: new consult    HISTORICAL  CHIEF COMPLAINT:  Chief Complaint  Patient presents with  . NP Ross  . Abnormal MRI    Brought CD.  Pt stated Chronic fatigue.    HISTORY OF PRESENT ILLNESS:   61 year old right-handed female here for evaluation of chronic fatigue, balance difficulty, gait difficulty. Patient reports symptoms gradually starting 1-2 years ago. Patient previously worked for [employer], retired in December 2016. Patient also having intermittent confusion, weakness, dizziness, easy bruising and bleeding, feeling cold, aching muscles, allergies, fatigue. Patient went to PCP, had MRI of the brain which showed bilateral T1 hyperintensity in the basal ganglia, and therefore patient was referred to me for further evaluation.  Interestingly patient has also had unexplained thrombocytopenia and unexplained LFT abnormalities for past several years. She has had multiple ultrasound and MRI testing of the liver, follow-up evaluations by GI, with no specific cause found. Patient denies excessive alcohol use. She drinks 1-2 drinks per month.  No other triggering, alleviating or aggravating factors. No change in diet, exercise, stress, sleep, residence.   REVIEW OF SYSTEMS: Full 14 system review of systems performed and negative with exception of: As per history of present illness.  ALLERGIES: Allergies  Allergen Reactions  . Sulfa Antibiotics Rash    HOME MEDICATIONS: Outpatient Medications Prior to Visit  Medication Sig Dispense Refill  . Butalbital-APAP-Caffeine 50-325-40 MG capsule Take 1 capsule by mouth every 4 (four) hours as needed for pain.    . Cholecalciferol (VITAMIN D3) 2000 units TABS Take 1 tablet by mouth daily.    . potassium chloride SA (K-DUR,KLOR-CON) 20 MEQ tablet Take 1 tablet (20 mEq total) by mouth 2 (two)  times daily. Take for 7 days 14 tablet 0  . Prenatal Vit-Fe Fumarate-FA (M-VIT) tablet Take 1 tablet by mouth daily.     No facility-administered medications prior to visit.     PAST MEDICAL HISTORY: Past Medical History:  Diagnosis Date  . Acute hypokalemia 11/26/2014  . Migraine   . Pancytopenia ([place])   . Vitamin D deficiency     PAST SURGICAL HISTORY: Past Surgical History:  Procedure Laterality Date  . CESAREAN SECTION     24 years ago  . tonsils and adneoids     as a child    FAMILY HISTORY: Family History  Problem Relation Age of Onset  . Pernicious anemia Maternal Grandfather   . Heart attack Maternal Grandfather   . Atrial fibrillation Mother   . Supraventricular tachycardia Father   . Heart disease Paternal Grandfather     SOCIAL HISTORY:  Social History   Social History  . Marital status: Married    Spouse name: N/A  . Number of children: N/A  . Years of education: N/A   Occupational History  . Not on file.   Social History Main Topics  . Smoking status: Former Smoker    Years: 2.00    Start date: 06/25/1968    Quit date: 05/16/2014  . Smokeless tobacco: Never Used  . Alcohol use 0.6 oz/week    1 Glasses of wine per week     Comment: occ  . Drug use: No  . Sexual activity: Not on file   Other Topics Concern  . Not on file   Social History Narrative   Lives with husband  In home.  Has seasonal allergies.  Drinks  coffee, 1 cup AM and 3 plus sweet tea.  Has 3 grown kids.       PHYSICAL EXAM  GENERAL EXAM/CONSTITUTIONAL: Vitals:  Vitals:   04/20/16 1441  BP: 139/87  Pulse: 79  Weight: 154 lb 9.6 oz (70.1 kg)  Height: 5' 8"  (1.727 m)     Body mass index is 23.51 kg/m.  Visual Acuity Screening   Right eye Left eye Both eyes  Without correction:     With correction: 20/40 20/50      Patient is in no distress; well developed, nourished and groomed; neck is supple  CARDIOVASCULAR:  Examination of carotid arteries is normal; no  carotid bruits  Regular rate and rhythm, no murmurs  Examination of peripheral vascular system by observation and palpation is normal  EYES:  Ophthalmoscopic exam of optic discs and posterior segments is normal; no papilledema or hemorrhages  MUSCULOSKELETAL:  Gait, strength, tone, movements noted in Neurologic exam below  NEUROLOGIC: MENTAL STATUS:  No flowsheet data found.  awake, alert, oriented to person, place and time  recent and remote memory intact  normal attention and concentration  language fluent, comprehension intact, naming intact,   fund of knowledge appropriate  CRANIAL NERVE:   2nd - no papilledema on fundoscopic exam  2nd, 3rd, 4th, 6th - pupils equal and reactive to light, visual fields full to confrontation, extraocular muscles intact, no nystagmus  5th - facial sensation symmetric  7th - facial strength symmetric  8th - hearing intact  9th - palate elevates symmetrically, uvula midline  11th - shoulder shrug symmetric  12th - tongue protrusion midline  MOTOR:   normal bulk and tone, full strength in the BUE, BLE  SLOW MOVEMENTS  SENSORY:   normal and symmetric to light touch, pinprick, temperature, vibration  COORDINATION:   finger-nose-finger, fine finger movements normal  REFLEXES:   deep tendon reflexes present and symmetric  GAIT/STATION:   narrow based gait; able to walk on toes, heels and tandem; romberg is negative    DIAGNOSTIC DATA (LABS, IMAGING, TESTING) - I reviewed patient records, labs, notes, testing and imaging myself where available.  Lab Results  Component Value Date   WBC 3.8 (L) 11/25/2014   HGB 12.6 11/25/2014   HCT 38.5 11/25/2014   MCV 87.9 11/25/2014   PLT 105 (L) 11/25/2014      Component Value Date/Time   NA 141 11/25/2014 1532   K 2.7 (LL) 11/25/2014 1532   CL 114 (H) 08/16/2014 0730   CO2 22 11/25/2014 1532   GLUCOSE 113 11/25/2014 1532   BUN 16.4 11/25/2014 1532   CREATININE 1.4  (H) 11/25/2014 1532   CALCIUM 10.4 11/25/2014 1532   PROT 6.8 11/25/2014 1532   ALBUMIN 3.4 (L) 11/25/2014 1532   AST 39 (H) 11/25/2014 1532   ALT 21 11/25/2014 1532   ALKPHOS 193 (H) 11/25/2014 1532   BILITOT 0.76 11/25/2014 1532   GFRNONAA 53 (L) 08/16/2014 0730   GFRAA >60 08/16/2014 0730   No results found for: CHOL, HDL, LDLCALC, LDLDIRECT, TRIG, CHOLHDL No results found for: HGBA1C Lab Results  Component Value Date   VITAMINB12 465 11/25/2014   No results found for: TSH   07/03/14 u/s abdomen 1. Hepatic steatosis. 2. Several indeterminate hypoechoic structures are again noted within the liver. On examination from 05/18/2013 note was made of several indeterminate hypoechoic left hepatic lobe lesions. At that time the largest was measured at 1.6 cm. On today's exam the largest measures up to 3.4 cm.  While these may represent areas of focal fatty sparing, in this patient who has elevated LFTs further investigation with cross-sectional imaging would be advised. The study of choice would be a contrast enhanced liver protocol MRI. Median hepatic shear wave velocity is calculated at 2.34 m/sec. Corresponding Metavir fibrosis score is F 3 and F 4. Risk of fibrosis is high. Follow-up:  Followup is advise.  05/13/14 u/s abdomen - Suspect fatty infiltration of the liver. Scattered hypoechoic areas in the liver may represent areas of focal fatty sparing. This could be further evaluated with MRI. - Small benign-appearing cyst in the dome.  - Small gallbladder wall polyp.  05/29/13 MRI liver w/wo 1. Liver and kidneys cysts. 2. No suspicious liver abnormalities identified     ASSESSMENT AND PLAN  61 y.o. year old female here with constellation of symptoms including chronic fatigue, balance difficulty, confusion, in the setting of abnormal MRI brain showing bilateral T1 hyperintensity in the basal ganglia. This finding is nonspecific but can be seen with a variety of metabolic  derangements, especially liver disease. Patient has unexplained thrombocytopenia and unexplained liver function abnormalities, which may be related to the MRI brain finding. Normally excessive alcohol use would be a common cause of these findings although patient denies this. Patient has not had follow-up with a hepatologist in some time. I do not see any other specific primary neurologic explanation for constellation of symptoms. I suspect that there is another underlying systemic process causing these multiple symptoms.   Dx: T1 shortening in the basal ganglia likely due to underlying liver disease (unknown cause)  1. Chronic fatigue   2. Abnormal liver function tests   3. Abnormal finding on MRI of brain   4. Thrombocytopenia ([place])      PLAN: - follow up with liver specialist to better define the fatty liver disease and liver function abnormalities - consider sleep study - no further neurologic testing advised  Return if symptoms worsen or fail to improve, for return to PCP.    Penni Bombard, MD 1/94/7125, 2:71 PM Certified in Neurology, Neurophysiology and Neuroimaging  [facility] [address], [place], [place] [address] [phone]

## 2016-05-12 ENCOUNTER — Other Ambulatory Visit: Payer: Self-pay | Admitting: Sports Medicine

## 2016-05-12 DIAGNOSIS — M79604 Pain in right leg: Secondary | ICD-10-CM

## 2016-05-12 DIAGNOSIS — R609 Edema, unspecified: Secondary | ICD-10-CM

## 2016-05-13 ENCOUNTER — Ambulatory Visit
Admission: RE | Admit: 2016-05-13 | Discharge: 2016-05-13 | Disposition: A | Discharge status: Home / Self Care | Payer: BLUE CROSS/BLUE SHIELD | Source: Ambulatory Visit | Attending: Sports Medicine | Admitting: Sports Medicine

## 2016-05-13 DIAGNOSIS — R609 Edema, unspecified: Secondary | ICD-10-CM

## 2016-05-13 DIAGNOSIS — M79604 Pain in right leg: Secondary | ICD-10-CM

## 2017-01-11 DIAGNOSIS — A419 Sepsis, unspecified organism: Secondary | ICD-10-CM

## 2017-01-11 HISTORY — DX: Sepsis, unspecified organism: A41.9

## 2017-02-02 ENCOUNTER — Emergency Department (HOSPITAL_COMMUNITY): Payer: BLUE CROSS/BLUE SHIELD

## 2017-02-02 ENCOUNTER — Inpatient Hospital Stay (HOSPITAL_COMMUNITY)
Admission: EM | Admit: 2017-02-02 | Discharge: 2017-02-06 | DRG: 872 | Disposition: A | Discharge status: Home / Self Care | Payer: BLUE CROSS/BLUE SHIELD | Attending: Internal Medicine | Admitting: Internal Medicine

## 2017-02-02 ENCOUNTER — Encounter (HOSPITAL_COMMUNITY): Payer: Self-pay | Admitting: Emergency Medicine

## 2017-02-02 DIAGNOSIS — A0472 Enterocolitis due to Clostridium difficile, not specified as recurrent: Secondary | ICD-10-CM | POA: Diagnosis not present

## 2017-02-02 DIAGNOSIS — I4581 Long QT syndrome: Secondary | ICD-10-CM | POA: Diagnosis present

## 2017-02-02 DIAGNOSIS — D696 Thrombocytopenia, unspecified: Secondary | ICD-10-CM | POA: Diagnosis present

## 2017-02-02 DIAGNOSIS — Z87891 Personal history of nicotine dependence: Secondary | ICD-10-CM

## 2017-02-02 DIAGNOSIS — N179 Acute kidney failure, unspecified: Secondary | ICD-10-CM | POA: Diagnosis present

## 2017-02-02 DIAGNOSIS — D638 Anemia in other chronic diseases classified elsewhere: Secondary | ICD-10-CM | POA: Diagnosis present

## 2017-02-02 DIAGNOSIS — R6510 Systemic inflammatory response syndrome (SIRS) of non-infectious origin without acute organ dysfunction: Secondary | ICD-10-CM | POA: Diagnosis present

## 2017-02-02 DIAGNOSIS — R197 Diarrhea, unspecified: Secondary | ICD-10-CM | POA: Diagnosis not present

## 2017-02-02 DIAGNOSIS — R112 Nausea with vomiting, unspecified: Secondary | ICD-10-CM | POA: Diagnosis present

## 2017-02-02 DIAGNOSIS — Z79899 Other long term (current) drug therapy: Secondary | ICD-10-CM

## 2017-02-02 DIAGNOSIS — E876 Hypokalemia: Secondary | ICD-10-CM | POA: Diagnosis present

## 2017-02-02 DIAGNOSIS — E860 Dehydration: Secondary | ICD-10-CM | POA: Diagnosis not present

## 2017-02-02 DIAGNOSIS — W06XXXA Fall from bed, initial encounter: Secondary | ICD-10-CM | POA: Diagnosis present

## 2017-02-02 DIAGNOSIS — J209 Acute bronchitis, unspecified: Secondary | ICD-10-CM

## 2017-02-02 DIAGNOSIS — D649 Anemia, unspecified: Secondary | ICD-10-CM | POA: Diagnosis present

## 2017-02-02 DIAGNOSIS — A419 Sepsis, unspecified organism: Secondary | ICD-10-CM | POA: Diagnosis not present

## 2017-02-02 DIAGNOSIS — Z882 Allergy status to sulfonamides status: Secondary | ICD-10-CM

## 2017-02-02 DIAGNOSIS — E872 Acidosis: Secondary | ICD-10-CM | POA: Diagnosis present

## 2017-02-02 LAB — URINALYSIS, ROUTINE W REFLEX MICROSCOPIC
Bilirubin Urine: NEGATIVE
Glucose, UA: >=500 mg/dL — AB
Ketones, ur: 5 mg/dL — AB
Leukocytes, UA: NEGATIVE
NITRITE: NEGATIVE
PH: 5 (ref 5.0–8.0)
Protein, ur: 100 mg/dL — AB
SPECIFIC GRAVITY, URINE: 1.014 (ref 1.005–1.030)

## 2017-02-02 LAB — COMPREHENSIVE METABOLIC PANEL
ALBUMIN: 3.4 g/dL — AB (ref 3.5–5.0)
ALT: 25 U/L (ref 14–54)
AST: 67 U/L — AB (ref 15–41)
Alkaline Phosphatase: 212 U/L — ABNORMAL HIGH (ref 38–126)
Anion gap: 12 (ref 5–15)
BUN: 11 mg/dL (ref 6–20)
CALCIUM: 9.8 mg/dL (ref 8.9–10.3)
CO2: 15 mmol/L — ABNORMAL LOW (ref 22–32)
Chloride: 107 mmol/L (ref 101–111)
Creatinine, Ser: 1.46 mg/dL — ABNORMAL HIGH (ref 0.44–1.00)
GFR calc Af Amer: 44 mL/min — ABNORMAL LOW (ref >60)
GFR calc non Af Amer: 38 mL/min — ABNORMAL LOW (ref >60)
GLUCOSE: 142 mg/dL — AB (ref 65–99)
Potassium: 2.7 mmol/L — CL (ref 3.5–5.1)
Sodium: 134 mmol/L — ABNORMAL LOW (ref 135–145)
Total Bilirubin: 2.4 mg/dL — ABNORMAL HIGH (ref 0.3–1.2)
Total Protein: 7 g/dL (ref 6.5–8.1)

## 2017-02-02 LAB — CBC WITH DIFFERENTIAL/PLATELET
BASOS ABS: 0 10*3/uL (ref 0.0–0.1)
Basophils Relative: 0 %
EOS ABS: 0 10*3/uL (ref 0.0–0.7)
Eosinophils Relative: 0 %
HCT: 36.1 % (ref 36.0–46.0)
Hemoglobin: 12.2 g/dL (ref 12.0–15.0)
LYMPHS ABS: 0.6 10*3/uL — AB (ref 0.7–4.0)
Lymphocytes Relative: 4 %
MCH: 32 pg (ref 26.0–34.0)
MCHC: 33.8 g/dL (ref 30.0–36.0)
MCV: 94.8 fL (ref 78.0–100.0)
Monocytes Absolute: 0.6 10*3/uL (ref 0.1–1.0)
Monocytes Relative: 4 %
Neutro Abs: 13.3 10*3/uL — ABNORMAL HIGH (ref 1.7–7.7)
Neutrophils Relative %: 92 %
Platelets: 74 10*3/uL — ABNORMAL LOW (ref 150–400)
RBC: 3.81 MIL/uL — ABNORMAL LOW (ref 3.87–5.11)
RDW: 15 % (ref 11.5–15.5)
WBC: 14.5 10*3/uL — ABNORMAL HIGH (ref 4.0–10.5)

## 2017-02-02 LAB — I-STAT CG4 LACTIC ACID, ED
LACTIC ACID, VENOUS: 1.35 mmol/L (ref 0.5–1.9)
Lactic Acid, Venous: 3.8 mmol/L (ref 0.5–1.9)

## 2017-02-02 LAB — ETHANOL

## 2017-02-02 MED ORDER — POTASSIUM CHLORIDE 10 MEQ/100ML IV SOLN
10.0000 meq | INTRAVENOUS | Status: AC
  Administered 2017-02-03 (×4): 10 meq via INTRAVENOUS
  Filled 2017-02-02 (×4): qty 100

## 2017-02-02 MED ORDER — ONDANSETRON HCL 4 MG/2ML IJ SOLN
4.0000 mg | Freq: Once | INTRAMUSCULAR | Status: AC
  Administered 2017-02-02: 4 mg via INTRAVENOUS
  Filled 2017-02-02: qty 2

## 2017-02-02 MED ORDER — POTASSIUM CHLORIDE CRYS ER 20 MEQ PO TBCR
20.0000 meq | EXTENDED_RELEASE_TABLET | Freq: Two times a day (BID) | ORAL | Status: DC
  Administered 2017-02-03 – 2017-02-04 (×5): 20 meq via ORAL
  Filled 2017-02-02 (×7): qty 1

## 2017-02-02 MED ORDER — PIPERACILLIN-TAZOBACTAM 3.375 G IVPB
3.3750 g | Freq: Three times a day (TID) | INTRAVENOUS | Status: DC
  Administered 2017-02-03 – 2017-02-04 (×4): 3.375 g via INTRAVENOUS
  Filled 2017-02-02 (×6): qty 50

## 2017-02-02 MED ORDER — SODIUM CHLORIDE 0.9 % IV SOLN
INTRAVENOUS | Status: DC
  Administered 2017-02-03 – 2017-02-06 (×7): via INTRAVENOUS

## 2017-02-02 MED ORDER — PIPERACILLIN-TAZOBACTAM 3.375 G IVPB 30 MIN
3.3750 g | Freq: Once | INTRAVENOUS | Status: AC
  Administered 2017-02-02: 3.375 g via INTRAVENOUS
  Filled 2017-02-02: qty 50

## 2017-02-02 MED ORDER — ACETAMINOPHEN 650 MG RE SUPP: 650.0000 mg | Freq: Four times a day (QID) | RECTAL | Status: DC | PRN

## 2017-02-02 MED ORDER — VANCOMYCIN HCL 10 G IV SOLR
1500.0000 mg | Freq: Once | INTRAVENOUS | Status: AC
  Administered 2017-02-02: 1500 mg via INTRAVENOUS
  Filled 2017-02-02: qty 1500

## 2017-02-02 MED ORDER — ONDANSETRON HCL 4 MG/2ML IJ SOLN
4.0000 mg | Freq: Four times a day (QID) | INTRAMUSCULAR | Status: DC | PRN
  Administered 2017-02-03 (×3): 4 mg via INTRAVENOUS
  Filled 2017-02-02 (×3): qty 2

## 2017-02-02 MED ORDER — ACETAMINOPHEN 325 MG PO TABS
650.0000 mg | ORAL_TABLET | Freq: Four times a day (QID) | ORAL | Status: DC | PRN
  Filled 2017-02-02: qty 2

## 2017-02-02 MED ORDER — ONDANSETRON HCL 4 MG PO TABS
4.0000 mg | ORAL_TABLET | Freq: Four times a day (QID) | ORAL | Status: DC | PRN
  Filled 2017-02-02: qty 1

## 2017-02-02 MED ORDER — VITAMIN D 1000 UNITS PO TABS
2000.0000 [IU] | ORAL_TABLET | Freq: Every day | ORAL | Status: DC
  Administered 2017-02-03 – 2017-02-06 (×4): 2000 [IU] via ORAL
  Filled 2017-02-02 (×4): qty 2

## 2017-02-02 MED ORDER — TOPIRAMATE 25 MG PO TABS
50.0000 mg | ORAL_TABLET | Freq: Every day | ORAL | Status: DC
  Administered 2017-02-03 – 2017-02-06 (×2): 50 mg via ORAL
  Filled 2017-02-02 (×4): qty 2

## 2017-02-02 MED ORDER — SODIUM CHLORIDE 0.9 % IV BOLUS (SEPSIS)
1000.0000 mL | Freq: Once | INTRAVENOUS | Status: AC
  Administered 2017-02-02: 1000 mL via INTRAVENOUS

## 2017-02-02 MED ORDER — VANCOMYCIN HCL IN DEXTROSE 1-5 GM/200ML-% IV SOLN: 1000.0000 mg | Freq: Once | INTRAVENOUS | Status: DC

## 2017-02-02 MED ORDER — VANCOMYCIN HCL IN DEXTROSE 750-5 MG/150ML-% IV SOLN
750.0000 mg | Freq: Two times a day (BID) | INTRAVENOUS | Status: DC
  Administered 2017-02-03: 750 mg via INTRAVENOUS
  Filled 2017-02-02: qty 150

## 2017-02-02 MED ORDER — ADULT MULTIVITAMIN W/MINERALS CH
1.0000 | ORAL_TABLET | Freq: Every day | ORAL | Status: DC
  Administered 2017-02-03 – 2017-02-06 (×4): 1 via ORAL
  Filled 2017-02-02 (×4): qty 1

## 2017-02-02 MED ORDER — FLUOXETINE HCL 20 MG PO CAPS
80.0000 mg | ORAL_CAPSULE | Freq: Every day | ORAL | Status: DC
  Administered 2017-02-03 – 2017-02-06 (×4): 80 mg via ORAL
  Filled 2017-02-02 (×4): qty 4

## 2017-02-02 NOTE — ED Notes (Signed)
Informed first nurse of I-stat lactic acid

## 2017-02-02 NOTE — ED Notes (Signed)
Pt also endorses cough and sore throat x2 weeks.

## 2017-02-02 NOTE — ED Triage Notes (Signed)
Per EMS - patient to ER for complaint of generalized weakness, n/v/d x2 days, family fell due to fall out of bed and was unable to get up. Pt is a/o x4. BP 128/76, no pain per patient.

## 2017-02-02 NOTE — ED Notes (Signed)
Dr. Sherry Ruffing notified of Lactic Acid 3.8.  Pt to go to next available room.

## 2017-02-02 NOTE — Progress Notes (Signed)
Pharmacy Antibiotic Note  Dana Watkins is a 62 y.o. female admitted on 02/02/2017 with sepsis. Patient presented to the ED with generalized weakness, n/v/d x 2 days. Pharmacy has been consulted for Zosyn and vancomycin dosing. WBC elevated at 14.5. LA 3.8. nCrCl ~ 45 mL/min.   Plan: -Zosyn 3.375 gm IV once over 30 minutes, then Zosyn 3.375 gm IV Q 8 hours (EI infusion) -Vancomycin 1500 mg IV once, then vancomycin 750 mg IV Q 12 hours -Monitor CBC, renal fx, cultures and clinical progress -VT at SS      Temp (24hrs), Avg:100.2 F (37.9 C), Min:100.2 F (37.9 C), Max:100.2 F (37.9 C)  Recent Labs  Lab 02/02/17 1831 02/02/17 1857  WBC 14.5*  --   CREATININE 1.46*  --   LATICACIDVEN  --  3.80*    CrCl cannot be calculated (Unknown ideal weight.).    Allergies  Allergen Reactions  . Sulfa Antibiotics Rash    Antimicrobials this admission: Vanc 1/23 >>  Zosyn 1/23 >>   Dose adjustments this admission: None   Microbiology results:   Thank you for allowing pharmacy to be a part of this patient's care.  Albertina Parr, PharmD., BCPS Clinical Pharmacist Pager [phone]

## 2017-02-02 NOTE — H&P (Signed)
History and Physical    Dana Watkins HER:[id number] DOB: [birth year] DOA: 02/02/2017  PCP: Lawerance Cruel, MD  Patient coming from: Home  I have personally briefly reviewed patient's old medical records in [place]  Chief Complaint: N/V/D, generalized weakness.  HPI: Dana Watkins is a 62 y.o. female with medical history significant of Hypokalemia, chronic pancytopenia.  Patient presents to the ED with c/o worsening generalized weakness, N/V/D.  Patient had bronchitis, got put on Ceftin for this on Friday.  Bronchitis improved, but then she developed N/V/D x2 days ago.  Symptoms are severe.  Some shaking chills and subjective fevers.   ED Course: Tm 100.2.  WBC 14.5k.  Given Zosyn and vanc.  K 2.7.  Creat 1.4, but this appears baseline.  C.Diff pending.   Review of Systems: As per HPI otherwise 10 point review of systems negative.   Past Medical History:  Diagnosis Date  . Acute hypokalemia 11/26/2014  . Migraine   . Pancytopenia ([place])   . Vitamin D deficiency     Past Surgical History:  Procedure Laterality Date  . CESAREAN SECTION     24 years ago  . tonsils and adneoids     as a child     reports that she quit smoking about 2 years ago. She started smoking about 48 years ago. She quit after 2.00 years of use. she has never used smokeless tobacco. She reports that she drinks about 0.6 oz of alcohol per week. She reports that she does not use drugs.  Allergies  Allergen Reactions  . Sulfa Antibiotics Rash    Family History  Problem Relation Age of Onset  . Pernicious anemia Maternal Grandfather   . Heart attack Maternal Grandfather   . Atrial fibrillation Mother   . Supraventricular tachycardia Father   . Heart disease Paternal Grandfather      Prior to Admission medications   Medication Sig Start Date End Date Taking? Authorizing Provider  butalbital-acetaminophen-caffeine (FIORICET, ESGIC) 50-325-40 MG tablet Take 1 tablet by mouth 3  (three) times daily as needed for headache.  01/17/17  Yes [provider]  cefUROXime (CEFTIN) 250 MG tablet Take 250 mg by mouth 2 (two) times daily. 01/28/17  Yes [provider]  Cholecalciferol (VITAMIN D3) 2000 units TABS Take 1 tablet by mouth daily.   Yes [provider]  diazepam (VALIUM) 10 MG tablet Take 10-20 mg by mouth at bedtime as needed. 01/23/17  Yes [provider]  FLUoxetine (PROZAC) 20 MG capsule Take 80 mg by mouth daily. 11/18/16  Yes [provider]  Multiple Vitamin (MULTIVITAMIN) tablet Take 1 tablet by mouth daily.   Yes [provider]  potassium chloride (MICRO-K) 10 MEQ CR capsule Take 20 mEq by mouth 2 (two) times daily. 12/13/16  Yes [provider]  topiramate (TOPAMAX) 50 MG tablet Take 50 mg by mouth daily. 11/18/16  Yes [provider]  potassium chloride SA (K-DUR,KLOR-CON) 20 MEQ tablet Take 1 tablet (20 mEq total) by mouth 2 (two) times daily. Take for 7 days Patient not taking: Reported on 02/02/2017 11/25/14   Heath Lark, MD    Physical Exam: Vitals:   02/02/17 2036 02/02/17 2100 02/02/17 2130 02/02/17 2200  BP: 133/77 133/74 129/77 130/75  Pulse: 92 88 93 87  Resp:  20 13 19   Temp:      TempSrc:      SpO2: 98% 98% 95% 96%    Constitutional: NAD, calm, comfortable Eyes: PERRL,  lids and conjunctivae normal ENMT: Mucous membranes are moist. Posterior pharynx clear of any exudate or lesions.Normal dentition.  Neck: normal, supple, no masses, no thyromegaly Respiratory: clear to auscultation bilaterally, no wheezing, no crackles. Normal respiratory effort. No accessory muscle use.  Cardiovascular: Regular rate and rhythm, no murmurs / rubs / gallops. No extremity edema. 2+ pedal pulses. No carotid bruits.  Abdomen: no tenderness, no masses palpated. No hepatosplenomegaly. Bowel sounds positive.  Musculoskeletal: no clubbing / cyanosis. No joint deformity upper and lower extremities.  Good ROM, no contractures. Normal muscle tone.  Skin: no rashes, lesions, ulcers. No induration Neurologic: CN 2-12 grossly intact. Sensation intact, DTR normal. Strength 5/5 in all 4.  Psychiatric: Normal judgment and insight. Alert and oriented x 3. Normal mood.    Labs on Admission: I have personally reviewed following labs and imaging studies  CBC: Recent Labs  Lab 02/02/17 1831  WBC 14.5*  NEUTROABS 13.3*  HGB 12.2  HCT 36.1  MCV 94.8  PLT 74*   Basic Metabolic Panel: Recent Labs  Lab 02/02/17 1831  NA 134*  K 2.7*  CL 107  CO2 15*  GLUCOSE 142*  BUN 11  CREATININE 1.46*  CALCIUM 9.8   GFR: CrCl cannot be calculated (Unknown ideal weight.). Liver Function Tests: Recent Labs  Lab 02/02/17 1831  AST 67*  ALT 25  ALKPHOS 212*  BILITOT 2.4*  PROT 7.0  ALBUMIN 3.4*   No results for input(s): LIPASE, AMYLASE in the last 168 hours. No results for input(s): AMMONIA in the last 168 hours. Coagulation Profile: No results for input(s): INR, PROTIME in the last 168 hours. Cardiac Enzymes: No results for input(s): CKTOTAL, CKMB, CKMBINDEX, TROPONINI in the last 168 hours. BNP (last 3 results) No results for input(s): PROBNP in the last 8760 hours. HbA1C: No results for input(s): HGBA1C in the last 72 hours. CBG: No results for input(s): GLUCAP in the last 168 hours. Lipid Profile: No results for input(s): CHOL, HDL, LDLCALC, TRIG, CHOLHDL, LDLDIRECT in the last 72 hours. Thyroid Function Tests: No results for input(s): TSH, T4TOTAL, FREET4, T3FREE, THYROIDAB in the last 72 hours. Anemia Panel: No results for input(s): VITAMINB12, FOLATE, FERRITIN, TIBC, IRON, RETICCTPCT in the last 72 hours. Urine analysis:    Component Value Date/Time   COLORURINE YELLOW 02/02/2017 1832   APPEARANCEUR HAZY (A) 02/02/2017 1832   LABSPEC 1.014 02/02/2017 1832   PHURINE 5.0 02/02/2017 1832   GLUCOSEU >=500 (A) 02/02/2017 1832   HGBUR MODERATE (A) 02/02/2017 1832    BILIRUBINUR NEGATIVE 02/02/2017 1832   KETONESUR 5 (A) 02/02/2017 1832   PROTEINUR 100 (A) 02/02/2017 1832   NITRITE NEGATIVE 02/02/2017 1832   LEUKOCYTESUR NEGATIVE 02/02/2017 1832    Radiological Exams on Admission: Dg Chest 2 View  Result Date: 02/02/2017 CLINICAL DATA:  Dizziness and headaches.  Cough. EXAM: CHEST  2 VIEW COMPARISON:  Chest radiograph 12/18/2007 FINDINGS: Mild chronic bronchitic changes. No pleural effusion or pneumothorax. No focal airspace consolidation or pulmonary edema. IMPRESSION: No focal airspace disease. Electronically Signed   By: Ulyses Jarred M.D.   On: 02/02/2017 19:32   Ct Head Wo Contrast  Result Date: 02/02/2017 CLINICAL DATA:  Generalized weakness, nausea, vomiting and diarrhea x2 days. Patient fell out of bed and was unable to get up. EXAM: CT HEAD WITHOUT CONTRAST TECHNIQUE: Contiguous axial images were obtained from the base of the skull through the vertex without intravenous contrast. COMPARISON:  None. FINDINGS: BRAIN: The ventricles and sulci are normal. No intraparenchymal hemorrhage,  mass effect nor midline shift. No acute large vascular territory infarcts. Grey-white matter distinction is maintained. The basal ganglia are unremarkable. No abnormal extra-axial fluid collections. Basal cisterns are not effaced and midline. The brainstem and cerebellar hemispheres are without acute abnormalities. VASCULAR: No hyperdense vessels. Mild atherosclerosis of the cavernous sinus carotids. SKULL/SOFT TISSUES: No skull fracture. No significant soft tissue swelling. ORBITS/SINUSES: The included ocular globes and orbital contents are normal.The mastoid air cells are clear. Complete opacification of the right maxillary sinus with inspissated mucus and maxillary sinus wall thickening compatible with chronic sinusitis, less likely mucocele although there is slight medial bowing of the medial wall of the maxillary sinus. OTHER: None. IMPRESSION: No acute intracranial  abnormality. Chronic right maxillary sinus disease. Electronically Signed   By: Ashley Royalty M.D.   On: 02/02/2017 22:10    EKG: Independently reviewed.  Assessment/Plan Principal Problem:   Nausea vomiting and diarrhea Active Problems:   Thrombocytopenia (H[facility])   Hypokalemia    1. N/V/D - 1. C.Diff pending 2. GI pathogen pnl pending 3. No abd pain or findings to indicate imaging at this time. 4. Repeat CBC/CMP in AM 5. Pro-calcitonin pending 6. Will leave on zosyn / vanc for the moment but if pro-calcitonin neg, would strongly consider de-escalation. 7. IVF - NS at 125 cc/hr 2. Thrombocytopenia - 1. Chronic 2. Stable 3. Not due to cirrhosis, nor MDS it appears based on prior heme/onc work up. 3. Hypokalemia - 1. Replacing K 2. Repeat CMP in AM  DVT prophylaxis: SCDs Code Status: Full Family Communication: No family in room Disposition Plan: Home after admit Consults called: None Admission status: Place in obs   Zacharee Gaddie, [facility] Pager [phone]  If 7AM-7PM, please contact day team taking care of patient [email or web address] Password [facility]  02/02/2017, 11:39 PM

## 2017-02-02 NOTE — ED Provider Notes (Signed)
Emergency Department Provider Note   I have reviewed the triage vital signs and the nursing notes.   HISTORY  Chief Complaint Nausea; Emesis; Diarrhea; and Weakness   HPI Dana Watkins is a 62 y.o. female with PMH of hypokalemia and thrombocytopenia presents emergency department for evaluation of worsening generalized weakness with associated nausea, vomiting, diarrhea over the past 2 days.  The patient estimates approximately 12 episodes of nonbloody diarrhea with some associated vomiting.  She is unable to keep down any fluids.  She was treated for sore throat and bronchitis symptoms by her PCP on Friday.  She was started on unknown antibiotic which she has been taking with some relief in those symptoms but her diarrhea and weakness started yesterday.  She has had some shaking chills and subjective fevers.  Denies abdominal pain or back pain.  No dysuria, hesitancy, urgency.  Past Medical History:  Diagnosis Date  . Acute hypokalemia 11/26/2014  . Migraine   . Pancytopenia ([place])   . Vitamin D deficiency     Patient Active Problem List   Diagnosis Date Noted  . Nausea vomiting and diarrhea 02/02/2017  . Hypokalemia 11/26/2014  . Acute renal failure ([place]) 11/26/2014  . Elevated alkaline phosphatase level 11/26/2014  . Abnormal weight gain 11/26/2014  . Chronic leukopenia 05/28/2014  . Anemia of chronic illness 05/28/2014  . Thrombocytopenia ([place]) 04/30/2013  . Elevated liver function tests 04/30/2013    Past Surgical History:  Procedure Laterality Date  . CESAREAN SECTION     24 years ago  . tonsils and adneoids     as a child    Current Outpatient Rx  . Order #: [id number] Class: Historical Med  . Order #: [id number] Class: Historical Med  . Order #: [id number] Class: Historical Med  . Order #: [id number] Class: Historical Med  . Order #: [id number] Class: Historical Med  . Order #: [id number] Class: Historical Med  . Order #: [id number] Class: Historical Med  . Order #:  [id number] Class: Historical Med  . Order #: [id number] Class: Normal    Allergies Sulfa antibiotics  Family History  Problem Relation Age of Onset  . Pernicious anemia Maternal Grandfather   . Heart attack Maternal Grandfather   . Atrial fibrillation Mother   . Supraventricular tachycardia Father   . Heart disease Paternal Grandfather     Social History Social History   Tobacco Use  . Smoking status: Former Smoker    Years: 2.00    Start date: 06/25/1968    Last attempt to quit: 05/16/2014    Years since quitting: 2.7  . Smokeless tobacco: Never Used  Substance Use Topics  . Alcohol use: Yes    Alcohol/week: 0.6 oz    Types: 1 Glasses of wine per week    Comment: occ  . Drug use: No    Review of Systems  Constitutional: No fever/chills. Positive generalized weakness and frequent falls with head injury.  Eyes: No visual changes. ENT: No sore throat. Cardiovascular: Denies chest pain. Respiratory: Denies shortness of breath. Gastrointestinal: No abdominal pain. Positive nausea, vomiting, and diarrhea.  No constipation. Genitourinary: Negative for dysuria. Musculoskeletal: Negative for back pain. Skin: Negative for rash. Neurological: Negative for headaches, focal weakness or numbness.  10-point ROS otherwise negative.  ____________________________________________   PHYSICAL EXAM:  VITAL SIGNS: ED Triage Vitals  Enc Vitals Group     BP 02/02/17 1818 (!) 161/83     Pulse Rate 02/02/17 1818 100     Resp 02/02/17 1818 18     Temp  02/02/17 1818 100.2 F (37.9 C)     Temp Source 02/02/17 1818 Oral     SpO2 02/02/17 1818 100 %     Pain Score 02/02/17 1822 0    Constitutional: Alert and oriented. Soft spoken and weak-appearing.  Eyes: Conjunctivae are normal. Head: Atraumatic. Nose: No congestion/rhinnorhea. Mouth/Throat: Mucous membranes are dry.  Neck: No stridor.  Cardiovascular: Tachycardia. Good peripheral circulation. Grossly normal heart sounds.     Respiratory: Normal respiratory effort.  No retractions. Lungs CTAB. Gastrointestinal: Soft and nontender.  Musculoskeletal: No lower extremity tenderness nor edema. No gross deformities of extremities. Neurologic:  Normal speech and language. No gross focal neurologic deficits are appreciated.  Skin:  Skin is warm, dry and intact. No rash noted.  ____________________________________________   LABS (all labs ordered are listed, but only abnormal results are displayed)  Labs Reviewed  COMPREHENSIVE METABOLIC PANEL - Abnormal; Notable for the following components:      Result Value   Sodium 134 (*)    Potassium 2.7 (*)    CO2 15 (*)    Glucose, Bld 142 (*)    Creatinine, Ser 1.46 (*)    Albumin 3.4 (*)    AST 67 (*)    Alkaline Phosphatase 212 (*)    Total Bilirubin 2.4 (*)    GFR calc non Af Amer 38 (*)    GFR calc Af Amer 44 (*)    All other components within normal limits  CBC WITH DIFFERENTIAL/PLATELET - Abnormal; Notable for the following components:   WBC 14.5 (*)    RBC 3.81 (*)    Platelets 74 (*)    Neutro Abs 13.3 (*)    Lymphs Abs 0.6 (*)    All other components within normal limits  URINALYSIS, ROUTINE W REFLEX MICROSCOPIC - Abnormal; Notable for the following components:   APPearance HAZY (*)    Glucose, UA >=500 (*)    Hgb urine dipstick MODERATE (*)    Ketones, ur 5 (*)    Protein, ur 100 (*)    Bacteria, UA RARE (*)    Squamous Epithelial / LPF 0-5 (*)    All other components within normal limits  I-STAT CG4 LACTIC ACID, ED - Abnormal; Notable for the following components:   Lactic Acid, Venous 3.80 (*)    All other components within normal limits  C DIFFICILE QUICK SCREEN W PCR REFLEX  CULTURE, BLOOD (ROUTINE X 2)  CULTURE, BLOOD (ROUTINE X 2)  URINE CULTURE  GASTROINTESTINAL PANEL BY PCR, STOOL (REPLACES STOOL CULTURE)  ETHANOL  HIV ANTIBODY (ROUTINE TESTING)  CBC  COMPREHENSIVE METABOLIC PANEL  PROCALCITONIN  PROCALCITONIN  I-STAT CG4  LACTIC ACID, ED   ____________________________________________  EKG   EKG Interpretation  Date/Time:  Wednesday February 02 2017 18:27:35 EST Ventricular Rate:  95 PR Interval:  148 QRS Duration: 96 QT Interval:  388 QTC Calculation: 487 R Axis:   72 Text Interpretation:  Normal sinus rhythm Nonspecific ST and T wave abnormality Prolonged QT Abnormal ECG No STEMI.  Confirmed by Nanda Quinton [phone]) on 02/02/2017 9:11:38 PM       ____________________________________________  RADIOLOGY  Dg Chest 2 View  Result Date: 02/02/2017 CLINICAL DATA:  Dizziness and headaches.  Cough. EXAM: CHEST  2 VIEW COMPARISON:  Chest radiograph 12/18/2007 FINDINGS: Mild chronic bronchitic changes. No pleural effusion or pneumothorax. No focal airspace consolidation or pulmonary edema. IMPRESSION: No focal airspace disease. Electronically Signed   By: Ulyses Jarred M.D.   On: 02/02/2017 19:32  Ct Head Wo Contrast  Result Date: 02/02/2017 CLINICAL DATA:  Generalized weakness, nausea, vomiting and diarrhea x2 days. Patient fell out of bed and was unable to get up. EXAM: CT HEAD WITHOUT CONTRAST TECHNIQUE: Contiguous axial images were obtained from the base of the skull through the vertex without intravenous contrast. COMPARISON:  None. FINDINGS: BRAIN: The ventricles and sulci are normal. No intraparenchymal hemorrhage, mass effect nor midline shift. No acute large vascular territory infarcts. Grey-white matter distinction is maintained. The basal ganglia are unremarkable. No abnormal extra-axial fluid collections. Basal cisterns are not effaced and midline. The brainstem and cerebellar hemispheres are without acute abnormalities. VASCULAR: No hyperdense vessels. Mild atherosclerosis of the cavernous sinus carotids. SKULL/SOFT TISSUES: No skull fracture. No significant soft tissue swelling. ORBITS/SINUSES: The included ocular globes and orbital contents are normal.The mastoid air cells are clear. Complete  opacification of the right maxillary sinus with inspissated mucus and maxillary sinus wall thickening compatible with chronic sinusitis, less likely mucocele although there is slight medial bowing of the medial wall of the maxillary sinus. OTHER: None. IMPRESSION: No acute intracranial abnormality. Chronic right maxillary sinus disease. Electronically Signed   By: Ashley Royalty M.D.   On: 02/02/2017 22:10    ____________________________________________   PROCEDURES  Procedure(s) performed:   .Critical Care Performed by: Margette Fast, MD Authorized by: Margette Fast, MD   Critical care provider statement:    Critical care time (minutes):  45   Critical care time was exclusive of:  Separately billable procedures and treating other patients and teaching time   Critical care was necessary to treat or prevent imminent or life-threatening deterioration of the following conditions:  Sepsis   Critical care was time spent personally by me on the following activities:  Blood draw for specimens, development of treatment plan with patient or surrogate, discussions with consultants, evaluation of patient's response to treatment, examination of patient, ordering and performing treatments and interventions, ordering and review of laboratory studies, ordering and review of radiographic studies, pulse oximetry, re-evaluation of patient's condition and review of old charts   I assumed direction of critical care for this patient from another provider in my specialty: no     ___________________________________________   INITIAL IMPRESSION / [place] / ED COURSE  Pertinent labs & imaging results that were available during my care of the patient were reviewed by me and considered in my medical decision making (see chart for details).  Patient presents to the emergency department for evaluation after multiple episodes of diarrhea and vomiting.  No abdominal tenderness to palpation.  Patient was  started on some antibiotic for URI symptoms.  Lactate elevated to 3.8 from the waiting room.  Initiating IV fluids and broad-spectrum antibiotics with borderline fever here.  No clear source for infection at this time.  Chest x-ray clear.  Mild electrolyte abnormalities and acute kidney injury.  Patient has had multiple falls and bruising over the right elbow and bilateral knees.  The friend states that she is hit her head.  Following CT of the head along with plain film of the right elbow.  Bruising on the knees looks old.  Patient with normal CT head and CXR. Will continue IVF and trend lactate. Plan for admission. Ordered C diff but no sample yet.   Discussed patient's case with Hospitalist to request admission. Patient and family (if present) updated with plan. Care transferred to Hospitalist service.  I reviewed all nursing notes, vitals, pertinent old records, EKGs, labs, imaging (  as available).  ____________________________________________  FINAL CLINICAL IMPRESSION(S) / ED DIAGNOSES  Final diagnoses:  Sepsis, due to unspecified organism ([place])  Dehydration     MEDICATIONS GIVEN DURING THIS VISIT:  Medications  vancomycin (VANCOCIN) 1,500 mg in sodium chloride 0.9 % 500 mL IVPB (1,500 mg Intravenous New Bag/Given 02/02/17 2213)  piperacillin-tazobactam (ZOSYN) IVPB 3.375 g (not administered)  vancomycin (VANCOCIN) IVPB 750 mg/150 ml premix (not administered)  potassium chloride 10 mEq in 100 mL IVPB (not administered)  0.9 %  sodium chloride infusion (not administered)  acetaminophen (TYLENOL) tablet 650 mg (not administered)    Or  acetaminophen (TYLENOL) suppository 650 mg (not administered)  ondansetron (ZOFRAN) tablet 4 mg (not administered)    Or  ondansetron (ZOFRAN) injection 4 mg (not administered)  cholecalciferol (VITAMIN D) tablet 2,000 Units (not administered)  FLUoxetine (PROZAC) capsule 80 mg (not administered)  multivitamin with minerals tablet 1 tablet (not  administered)  potassium chloride SA (K-DUR,KLOR-CON) CR tablet 20 mEq (not administered)  topiramate (TOPAMAX) tablet 50 mg (not administered)  sodium chloride 0.9 % bolus 1,000 mL (0 mLs Intravenous Stopped 02/02/17 2213)  piperacillin-tazobactam (ZOSYN) IVPB 3.375 g (0 g Intravenous Stopped 02/02/17 2151)  ondansetron (ZOFRAN) injection 4 mg (4 mg Intravenous Given 02/02/17 2121)    Note:  This document was prepared using Dragon voice recognition software and may include unintentional dictation errors.  Nanda Quinton, MD Emergency Medicine    Long, [facility], MD 02/03/17 [phone]

## 2017-02-03 ENCOUNTER — Inpatient Hospital Stay (HOSPITAL_COMMUNITY): Payer: BLUE CROSS/BLUE SHIELD

## 2017-02-03 DIAGNOSIS — E860 Dehydration: Secondary | ICD-10-CM | POA: Diagnosis present

## 2017-02-03 DIAGNOSIS — A419 Sepsis, unspecified organism: Secondary | ICD-10-CM | POA: Diagnosis present

## 2017-02-03 DIAGNOSIS — W06XXXA Fall from bed, initial encounter: Secondary | ICD-10-CM | POA: Diagnosis present

## 2017-02-03 DIAGNOSIS — Z882 Allergy status to sulfonamides status: Secondary | ICD-10-CM | POA: Diagnosis not present

## 2017-02-03 DIAGNOSIS — R197 Diarrhea, unspecified: Secondary | ICD-10-CM | POA: Diagnosis not present

## 2017-02-03 DIAGNOSIS — E876 Hypokalemia: Secondary | ICD-10-CM | POA: Diagnosis present

## 2017-02-03 DIAGNOSIS — D696 Thrombocytopenia, unspecified: Secondary | ICD-10-CM | POA: Diagnosis present

## 2017-02-03 DIAGNOSIS — I4581 Long QT syndrome: Secondary | ICD-10-CM | POA: Diagnosis present

## 2017-02-03 DIAGNOSIS — E872 Acidosis: Secondary | ICD-10-CM | POA: Diagnosis present

## 2017-02-03 DIAGNOSIS — J209 Acute bronchitis, unspecified: Secondary | ICD-10-CM | POA: Diagnosis present

## 2017-02-03 DIAGNOSIS — R112 Nausea with vomiting, unspecified: Secondary | ICD-10-CM | POA: Diagnosis not present

## 2017-02-03 DIAGNOSIS — R6510 Systemic inflammatory response syndrome (SIRS) of non-infectious origin without acute organ dysfunction: Secondary | ICD-10-CM | POA: Diagnosis present

## 2017-02-03 DIAGNOSIS — D638 Anemia in other chronic diseases classified elsewhere: Secondary | ICD-10-CM | POA: Diagnosis present

## 2017-02-03 DIAGNOSIS — A0472 Enterocolitis due to Clostridium difficile, not specified as recurrent: Secondary | ICD-10-CM | POA: Diagnosis present

## 2017-02-03 DIAGNOSIS — Z87891 Personal history of nicotine dependence: Secondary | ICD-10-CM | POA: Diagnosis not present

## 2017-02-03 DIAGNOSIS — A09 Infectious gastroenteritis and colitis, unspecified: Secondary | ICD-10-CM | POA: Diagnosis not present

## 2017-02-03 DIAGNOSIS — N179 Acute kidney failure, unspecified: Secondary | ICD-10-CM | POA: Diagnosis present

## 2017-02-03 DIAGNOSIS — Z79899 Other long term (current) drug therapy: Secondary | ICD-10-CM | POA: Diagnosis not present

## 2017-02-03 LAB — COMPREHENSIVE METABOLIC PANEL
ALT: 25 U/L (ref 14–54)
AST: 54 U/L — ABNORMAL HIGH (ref 15–41)
Albumin: 2.8 g/dL — ABNORMAL LOW (ref 3.5–5.0)
Alkaline Phosphatase: 171 U/L — ABNORMAL HIGH (ref 38–126)
Anion gap: 8 (ref 5–15)
BUN: 11 mg/dL (ref 6–20)
CALCIUM: 9.1 mg/dL (ref 8.9–10.3)
CHLORIDE: 115 mmol/L — AB (ref 101–111)
CO2: 14 mmol/L — ABNORMAL LOW (ref 22–32)
CREATININE: 1.06 mg/dL — AB (ref 0.44–1.00)
GFR, EST NON AFRICAN AMERICAN: 55 mL/min — AB (ref >60)
Glucose, Bld: 113 mg/dL — ABNORMAL HIGH (ref 65–99)
Potassium: 2.6 mmol/L — CL (ref 3.5–5.1)
Sodium: 137 mmol/L (ref 135–145)
Total Bilirubin: 2 mg/dL — ABNORMAL HIGH (ref 0.3–1.2)
Total Protein: 5.8 g/dL — ABNORMAL LOW (ref 6.5–8.1)

## 2017-02-03 LAB — GASTROINTESTINAL PANEL BY PCR, STOOL (REPLACES STOOL CULTURE)
ASTROVIRUS: NOT DETECTED
Adenovirus F40/41: NOT DETECTED
CAMPYLOBACTER SPECIES: NOT DETECTED
Cryptosporidium: NOT DETECTED
Cyclospora cayetanensis: NOT DETECTED
ENTEROTOXIGENIC E COLI (ETEC): NOT DETECTED
Entamoeba histolytica: NOT DETECTED
Enteroaggregative E coli (EAEC): NOT DETECTED
Enteropathogenic E coli (EPEC): NOT DETECTED
Giardia lamblia: NOT DETECTED
NOROVIRUS GI/GII: NOT DETECTED
PLESIMONAS SHIGELLOIDES: NOT DETECTED
ROTAVIRUS A: NOT DETECTED
SHIGA LIKE TOXIN PRODUCING E COLI (STEC): NOT DETECTED
Salmonella species: NOT DETECTED
Sapovirus (I, II, IV, and V): NOT DETECTED
Shigella/Enteroinvasive E coli (EIEC): NOT DETECTED
Vibrio cholerae: NOT DETECTED
Vibrio species: NOT DETECTED
Yersinia enterocolitica: NOT DETECTED

## 2017-02-03 LAB — CBC
HCT: 32.3 % — ABNORMAL LOW (ref 36.0–46.0)
Hemoglobin: 10.9 g/dL — ABNORMAL LOW (ref 12.0–15.0)
MCH: 31.8 pg (ref 26.0–34.0)
MCHC: 33.7 g/dL (ref 30.0–36.0)
MCV: 94.2 fL (ref 78.0–100.0)
PLATELETS: 64 10*3/uL — AB (ref 150–400)
RBC: 3.43 MIL/uL — AB (ref 3.87–5.11)
RDW: 15.1 % (ref 11.5–15.5)
WBC: 12.9 10*3/uL — ABNORMAL HIGH (ref 4.0–10.5)

## 2017-02-03 LAB — C DIFFICILE QUICK SCREEN W PCR REFLEX
C DIFFICILE (CDIFF) INTERP: DETECTED
C DIFFICILE (CDIFF) TOXIN: POSITIVE — AB
C Diff antigen: POSITIVE — AB

## 2017-02-03 LAB — PROCALCITONIN
PROCALCITONIN: 0.25 ng/mL
Procalcitonin: 0.24 ng/mL

## 2017-02-03 LAB — HIV ANTIBODY (ROUTINE TESTING W REFLEX): HIV Screen 4th Generation wRfx: NONREACTIVE

## 2017-02-03 MED ORDER — BACID PO TABS
2.0000 | ORAL_TABLET | Freq: Three times a day (TID) | ORAL | Status: DC
  Administered 2017-02-03 – 2017-02-06 (×10): 2 via ORAL
  Filled 2017-02-03 (×13): qty 2

## 2017-02-03 MED ORDER — HYDROCODONE-HOMATROPINE 5-1.5 MG/5ML PO SYRP: 5.0000 mL | ORAL_SOLUTION | Freq: Four times a day (QID) | ORAL | Status: DC | PRN

## 2017-02-03 MED ORDER — VANCOMYCIN 50 MG/ML ORAL SOLUTION
125.0000 mg | Freq: Four times a day (QID) | ORAL | Status: DC
  Administered 2017-02-03 – 2017-02-06 (×12): 125 mg via ORAL
  Filled 2017-02-03 (×15): qty 2.5

## 2017-02-03 MED ORDER — DM-GUAIFENESIN ER 30-600 MG PO TB12
1.0000 | ORAL_TABLET | Freq: Two times a day (BID) | ORAL | Status: DC
  Administered 2017-02-03 – 2017-02-04 (×4): 1 via ORAL
  Filled 2017-02-03 (×5): qty 1

## 2017-02-03 NOTE — Progress Notes (Signed)
PROGRESS NOTE    Patient: Dana Watkins     PCP: Lawerance Cruel, MD                    DOB: 01/01/1956            DOA: 02/02/2017 ENI:[id number]             DOS: 02/03/2017, 1:27 PM   Date of Service: the patient was seen and examined on 02/03/2017 Subjective:   Was seen and examined this morning, continue to complain of cough, mild shortness of breath. Also having diarrhea.   ----------------------------------------------------------------------------------------------------------------------  Brief Narrative:   Dana Watkins is a 62 y.o. female with medical history significant of Hypokalemia, chronic pancytopenia.  Patient presents to the ED with c/o worsening generalized weakness, N/V/D.  Patient had bronchitis, got put on Ceftin for this on Friday.  Bronchitis improved, but then she developed N/V/D x2 days ago.  Symptoms are severe.  Some shaking chills and subjective fevers.   Assessment & Plan:   Nausea vomiting and diarrhea -  C. difficile is positive, extensive diarrhea, dehydration, we will add p.o. vancomycin Currently afebrile, normotensive, will continue to monitor very closely Continue with IV fluid hydration  Bronchitis -persistent cough, subjective fever, chest x-ray was negative we will pursue with CT of the chest, also will screen for influenza We will de-escalate antibiotics of vancomycin and Zosyn    Thrombocytopenia ([facility]) - Seems to be chronic, no signs of bleeding, history of liver cirrhosis, nor MDS it appears based on prior heme/onc work up  Hypokalemia -monitoring, repleted accordingly     Anemia of chronic illness -monitoring H&H closely   DVT prophylaxis:   Heparin Sq    SCDs/compression stockings        Code Status:         Full code  Family Communication:  The above findings and plan of care has been discussed with patient in detail, they expressed understanding and agreement of above.   Disposition Plan:  > 3 days   Home              Consultants:   None   Procedures:  No admission procedures for hospital encounter.   Antimicrobials:  Anti-infectives (From admission, onward)   Start     Dose/Rate Route Frequency Ordered Stop   02/03/17 1330  vancomycin (VANCOCIN) 50 mg/mL oral solution 125 mg     125 mg Oral Every 6 hours 02/03/17 1324     02/03/17 1000  vancomycin (VANCOCIN) IVPB 750 mg/150 ml premix     750 mg 150 mL/hr over 60 Minutes Intravenous Every 12 hours 02/02/17 2059     02/03/17 0400  piperacillin-tazobactam (ZOSYN) IVPB 3.375 g     3.375 g 12.5 mL/hr over 240 Minutes Intravenous Every 8 hours 02/02/17 2059     02/02/17 2100  piperacillin-tazobactam (ZOSYN) IVPB 3.375 g     3.375 g 100 mL/hr over 30 Minutes Intravenous  Once 02/02/17 2046 02/02/17 2151   02/02/17 2100  vancomycin (VANCOCIN) IVPB 1000 mg/200 mL premix  Status:  Discontinued     1,000 mg 200 mL/hr over 60 Minutes Intravenous  Once 02/02/17 2046 02/02/17 2054   02/02/17 2100  vancomycin (VANCOCIN) 1,500 mg in sodium chloride 0.9 % 500 mL IVPB     1,500 mg 250 mL/hr over 120 Minutes Intravenous  Once 02/02/17 2054 02/03/17 0013       Objective: Vitals:   02/03/17 0000 02/03/17 0105  02/03/17 0621 02/03/17 1255  BP: 127/83 140/70 127/68 122/71  Pulse: 93 91 98 88  Resp: (!) 22 17 16 18   Temp:  99.7 F (37.6 C) 99.7 F (37.6 C) 98.6 F (37 C)  TempSrc:  Oral  Oral  SpO2: 98% 95% 100% 100%  Weight:  69.9 kg (154 lb 1.6 oz)    Height:  5' 8"  (1.727 m)      Intake/Output Summary (Last 24 hours) at 02/03/2017 1327 Last data filed at 02/03/2017 1317 Gross per 24 hour  Intake 3412.5 ml  Output 0 ml  Net 3412.5 ml   Filed Weights   02/03/17 0105  Weight: 69.9 kg (154 lb 1.6 oz)    Examination:  General exam: Complaining of cough, sputum production, diarrhea Respiratory system: Clear to auscultation. Respiratory effort normal. Cardiovascular system: S1 & S2 heard, RRR. No JVD, murmurs, rubs, gallops or  clicks. No pedal edema. Gastrointestinal system: Abdomen is nondistended, soft and nontender. No organomegaly or masses felt. Normal bowel sounds heard. Central nervous system: Alert and oriented. No focal neurological deficits. Extremities: Symmetric 5 x 5 power. Skin: No rashes, lesions or ulcers Psychiatry: Judgement and insight appear normal. Mood & affect appropriate.     Data Reviewed: I have personally reviewed following labs and imaging studies  CBC: Recent Labs  Lab 02/02/17 1831 02/03/17 0636  WBC 14.5* 12.9*  NEUTROABS 13.3*  --   HGB 12.2 10.9*  HCT 36.1 32.3*  MCV 94.8 94.2  PLT 74* 64*   Basic Metabolic Panel: Recent Labs  Lab 02/02/17 1831 02/03/17 0636  NA 134* 137  K 2.7* 2.6*  CL 107 115*  CO2 15* 14*  GLUCOSE 142* 113*  BUN 11 11  CREATININE 1.46* 1.06*  CALCIUM 9.8 9.1   GFR: Estimated Creatinine Clearance: 56.2 mL/min (A) (by C-G formula based on SCr of 1.06 mg/dL (H)). Liver Function Tests: Recent Labs  Lab 02/02/17 1831 02/03/17 0636  AST 67* 54*  ALT 25 25  ALKPHOS 212* 171*  BILITOT 2.4* 2.0*  PROT 7.0 5.8*  ALBUMIN 3.4* 2.8*    Sepsis Labs: Recent Labs  Lab 02/02/17 1857 02/02/17 2112 02/03/17 0049 02/03/17 0636  PROCALCITON  --   --  0.25 0.24  LATICACIDVEN 3.80* 1.35  --   --     Recent Results (from the past 240 hour(s))  Culture, blood (routine x 2)     Status: None (Preliminary result)   Collection Time: 02/02/17  8:58 PM  Result Value Ref Range Status   Specimen Description BLOOD RIGHT HAND  Final   Special Requests   Final    BOTTLES DRAWN AEROBIC AND ANAEROBIC Blood Culture adequate volume   Culture NO GROWTH < 12 HOURS  Final   Report Status PENDING  Incomplete  Culture, blood (routine x 2)     Status: None (Preliminary result)   Collection Time: 02/02/17  9:15 PM  Result Value Ref Range Status   Specimen Description BLOOD RIGHT ANTECUBITAL  Final   Special Requests   Final    BOTTLES DRAWN AEROBIC AND  ANAEROBIC Blood Culture adequate volume   Culture NO GROWTH < 12 HOURS  Final   Report Status PENDING  Incomplete  C difficile quick scan w PCR reflex     Status: Abnormal   Collection Time: 02/03/17  8:01 AM  Result Value Ref Range Status   C Diff antigen POSITIVE (A) NEGATIVE Final   C Diff toxin POSITIVE (A) NEGATIVE Final   C  Diff interpretation Toxin producing C. difficile detected.  Final    Comment: CRITICAL RESULT CALLED TO, READ BACK BY AND VERIFIED WITH: Barbara Cower RN 63:78 5/88/50 ([email or web address])        Radiology Studies: Dg Chest 2 View  Result Date: 02/02/2017 CLINICAL DATA:  Dizziness and headaches.  Cough. EXAM: CHEST  2 VIEW COMPARISON:  Chest radiograph 12/18/2007 FINDINGS: Mild chronic bronchitic changes. No pleural effusion or pneumothorax. No focal airspace consolidation or pulmonary edema. IMPRESSION: No focal airspace disease. Electronically Signed   By: Ulyses Jarred M.D.   On: 02/02/2017 19:32   Ct Head Wo Contrast  Result Date: 02/02/2017 CLINICAL DATA:  Generalized weakness, nausea, vomiting and diarrhea x2 days. Patient fell out of bed and was unable to get up. EXAM: CT HEAD WITHOUT CONTRAST TECHNIQUE: Contiguous axial images were obtained from the base of the skull through the vertex without intravenous contrast. COMPARISON:  None. FINDINGS: BRAIN: The ventricles and sulci are normal. No intraparenchymal hemorrhage, mass effect nor midline shift. No acute large vascular territory infarcts. Grey-white matter distinction is maintained. The basal ganglia are unremarkable. No abnormal extra-axial fluid collections. Basal cisterns are not effaced and midline. The brainstem and cerebellar hemispheres are without acute abnormalities. VASCULAR: No hyperdense vessels. Mild atherosclerosis of the cavernous sinus carotids. SKULL/SOFT TISSUES: No skull fracture. No significant soft tissue swelling. ORBITS/SINUSES: The included ocular globes and orbital contents are normal.The  mastoid air cells are clear. Complete opacification of the right maxillary sinus with inspissated mucus and maxillary sinus wall thickening compatible with chronic sinusitis, less likely mucocele although there is slight medial bowing of the medial wall of the maxillary sinus. OTHER: None. IMPRESSION: No acute intracranial abnormality. Chronic right maxillary sinus disease. Electronically Signed   By: Ashley Royalty M.D.   On: 02/02/2017 22:10    Scheduled Meds: . cholecalciferol  2,000 Units Oral Daily  . dextromethorphan-guaiFENesin  1 tablet Oral BID  . FLUoxetine  80 mg Oral Daily  . lactobacillus acidophilus  2 tablet Oral TID  . multivitamin with minerals  1 tablet Oral Daily  . potassium chloride SA  20 mEq Oral BID  . topiramate  50 mg Oral Daily  . vancomycin  125 mg Oral Q6H   Continuous Infusions: . sodium chloride 125 mL/hr at 02/03/17 0637  . piperacillin-tazobactam (ZOSYN)  IV 3.375 g (02/03/17 1227)  . vancomycin 750 mg (02/03/17 1057)     LOS: 0 days    Time spent: >25 minutes   Deatra James, MD [facility] Pager [phone]  If 7PM-7AM, please contact night-coverage [email or web address] Password [email or web address] 02/03/2017, 1:27 PM

## 2017-02-03 NOTE — Progress Notes (Signed)
Critical lab called in Potassium-2.6. Dr. Gretchen Short made aware.

## 2017-02-03 NOTE — Progress Notes (Signed)
Received pt alert and oriented. Pt with general weakness noted. Oriented pt to room and use of call light. Safety ensured.

## 2017-02-03 NOTE — Progress Notes (Signed)
Had CT scan chest. Back from Radiology via bed

## 2017-02-04 LAB — BASIC METABOLIC PANEL
Anion gap: 9 (ref 5–15)
BUN: 13 mg/dL (ref 6–20)
CHLORIDE: 115 mmol/L — AB (ref 101–111)
CO2: 14 mmol/L — AB (ref 22–32)
CREATININE: 1.01 mg/dL — AB (ref 0.44–1.00)
Calcium: 8.9 mg/dL (ref 8.9–10.3)
GFR calc Af Amer: >60 mL/min (ref >60)
GFR calc non Af Amer: 59 mL/min — ABNORMAL LOW (ref >60)
GLUCOSE: 104 mg/dL — AB (ref 65–99)
POTASSIUM: 2.4 mmol/L — AB (ref 3.5–5.1)
Sodium: 138 mmol/L (ref 135–145)

## 2017-02-04 LAB — RESPIRATORY PANEL BY PCR
Adenovirus: NOT DETECTED
BORDETELLA PERTUSSIS-RVPCR: NOT DETECTED
CORONAVIRUS 229E-RVPPCR: NOT DETECTED
Chlamydophila pneumoniae: NOT DETECTED
Coronavirus HKU1: NOT DETECTED
Coronavirus NL63: NOT DETECTED
Coronavirus OC43: NOT DETECTED
INFLUENZA B-RVPPCR: NOT DETECTED
Influenza A: NOT DETECTED
METAPNEUMOVIRUS-RVPPCR: NOT DETECTED
Mycoplasma pneumoniae: NOT DETECTED
Parainfluenza Virus 1: NOT DETECTED
Parainfluenza Virus 2: NOT DETECTED
Parainfluenza Virus 3: NOT DETECTED
Parainfluenza Virus 4: NOT DETECTED
RESPIRATORY SYNCYTIAL VIRUS-RVPPCR: NOT DETECTED
Rhinovirus / Enterovirus: NOT DETECTED

## 2017-02-04 LAB — CBC
HEMATOCRIT: 30.2 % — AB (ref 36.0–46.0)
Hemoglobin: 10.1 g/dL — ABNORMAL LOW (ref 12.0–15.0)
MCH: 31.4 pg (ref 26.0–34.0)
MCHC: 33.4 g/dL (ref 30.0–36.0)
MCV: 93.8 fL (ref 78.0–100.0)
PLATELETS: 60 10*3/uL — AB (ref 150–400)
RBC: 3.22 MIL/uL — ABNORMAL LOW (ref 3.87–5.11)
RDW: 15.2 % (ref 11.5–15.5)
WBC: 10.8 10*3/uL — AB (ref 4.0–10.5)

## 2017-02-04 LAB — URINE CULTURE

## 2017-02-04 LAB — PROCALCITONIN: Procalcitonin: 0.29 ng/mL

## 2017-02-04 LAB — MAGNESIUM: Magnesium: 1.9 mg/dL (ref 1.7–2.4)

## 2017-02-04 MED ORDER — AZITHROMYCIN 250 MG PO TABS
500.0000 mg | ORAL_TABLET | Freq: Every day | ORAL | Status: DC
  Administered 2017-02-04: 500 mg via ORAL
  Filled 2017-02-04 (×2): qty 2

## 2017-02-04 MED ORDER — POTASSIUM CHLORIDE 10 MEQ/100ML IV SOLN
10.0000 meq | INTRAVENOUS | Status: AC
  Administered 2017-02-04 (×2): 10 meq via INTRAVENOUS
  Filled 2017-02-04 (×2): qty 100

## 2017-02-04 MED ORDER — MAGNESIUM SULFATE 2 GM/50ML IV SOLN
2.0000 g | Freq: Once | INTRAVENOUS | Status: AC
  Administered 2017-02-04: 2 g via INTRAVENOUS
  Filled 2017-02-04: qty 50

## 2017-02-04 NOTE — Progress Notes (Signed)
CRITICAL VALUE ALERT  Critical Value:  Potassium 2.4  Date & Time Notied:  02/04/17  @0720   Provider Notified: Dr. Roger Shelter   Orders Received/Actions taken: Potassium and magnesium infusion ordered

## 2017-02-04 NOTE — Progress Notes (Signed)
PROGRESS NOTE    Patient: Dana Watkins     PCP: Lawerance Cruel, MD                    DOB: 02/28/1955            DOA: 02/02/2017 [id number]:[id number]             DOS: 02/04/2017, 12:13 PM   Date of Service: the patient was seen and examined on 02/04/2017 Subjective:   Patient was seen and examined this morning, in good spirit.  Reporting about 3 bouts of watery diarrhea overnight.  Accompanied by family at bedside. Reporting improved cough.  Improved abdominal pain. Improved nausea vomiting, asking if her diet can be advanced. She was confirmed C. difficile positive yesterday was started on p.o. vancomycin which she has tolerated. ----------------------------------------------------------------------------------------------------------------------  Brief Narrative:   Dana Watkins is a 62 y.o. female with medical history significant of Hypokalemia, chronic pancytopenia.  Patient presents to the ED with c/o worsening generalized weakness, N/V/D.  Patient had bronchitis, got put on Ceftin for this on Friday.  Bronchitis improved, but then she developed N/V/D x2 days ago.  Symptoms are severe.  Some shaking chills and subjective fevers. -----------------------------------------------------------------------------------------------------------------------------  Assessment & Plan:   Nausea vomiting and diarrhea -  Reporting of improved nausea vomiting and diarrhea.  Stating she has had about 3 bouts of diarrhea overnight. Inquiring of her diet could be advanced as her nausea vomiting is improved.  C. difficile colitis -confirmed by stool studies yesterday, was started on p.o. vancomycin yesterday.  We will continue to monitor, continue IV fluid hydration along with encouraging p.o. hydration.   Bronchitis -much improved cough, afebrile, CT of the chest was negative for pneumonia  We will D/C antibiotics of vancomycin and Zosyn Switch her to p.o. azithromycin for few more days for  severe bronchitis.  Which is improving. (It was explained to the patient and family at bedside prolonged antibiotic will continue to exacerbate her C. difficile colitis.  Antibiotics will be DC'd in 2-3) screen for viral infection/influenza were all negative   Thrombocytopenia (HCC) - Seems to be chronic, no signs of bleeding, history of liver cirrhosis, nor MDS it appears based on prior heme/onc work up  Sever Hypokalemia -probably worsening with diarrhea, monitoring, repleted accordingly, PO and IV  Anemia of chronic illness -monitoring H&H closely   DVT prophylaxis:   Heparin Sq    SCDs/compression stockings        Code Status:         Full code  Family Communication:  The above findings and plan of care has been discussed with patient and her family in detail, they expressed understanding and agreement of above.   Disposition Plan:  2-3 days            Home              Consultants:   None   Procedures:  No admission procedures for hospital encounter.   Antimicrobials:  Anti-infectives (From admission, onward)   Start     Dose/Rate Route Frequency Ordered Stop   02/04/17 1000  azithromycin (ZITHROMAX) tablet 500 mg     500 mg Oral Daily 02/04/17 0841 02/09/17 0959   02/03/17 1330  vancomycin (VANCOCIN) 50 mg/mL oral solution 125 mg     125 mg Oral Every 6 hours 02/03/17 1324     02/03/17 1000  vancomycin (VANCOCIN) IVPB 750 mg/150 ml premix  Status:  Discontinued  750 mg 150 mL/hr over 60 Minutes Intravenous Every 12 hours 02/02/17 2059 02/03/17 1336   02/03/17 0400  piperacillin-tazobactam (ZOSYN) IVPB 3.375 g  Status:  Discontinued     3.375 g 12.5 mL/hr over 240 Minutes Intravenous Every 8 hours 02/02/17 2059 02/04/17 0840   02/02/17 2100  piperacillin-tazobactam (ZOSYN) IVPB 3.375 g     3.375 g 100 mL/hr over 30 Minutes Intravenous  Once 02/02/17 2046 02/02/17 2151   02/02/17 2100  vancomycin (VANCOCIN) IVPB 1000 mg/200 mL premix  Status:  Discontinued      1,000 mg 200 mL/hr over 60 Minutes Intravenous  Once 02/02/17 2046 02/02/17 2054   02/02/17 2100  vancomycin (VANCOCIN) 1,500 mg in sodium chloride 0.9 % 500 mL IVPB     1,500 mg 250 mL/hr over 120 Minutes Intravenous  Once 02/02/17 2054 02/03/17 0013       Objective: Vitals:   02/03/17 0621 02/03/17 1255 02/03/17 2103 02/04/17 0630  BP: 127/68 122/71 (!) 105/53 112/68  Pulse: 98 88 91 84  Resp: 16 18 17 17   Temp: 99.7 F (37.6 C) 98.6 F (37 C) (!) 100.4 F (38 C) 99 F (37.2 C)  TempSrc:  Oral Oral Oral  SpO2: 100% 100% 98% 98%  Weight:      Height:        Intake/Output Summary (Last 24 hours) at 02/04/2017 1213 Last data filed at 02/04/2017 0919 Gross per 24 hour  Intake 4085.42 ml  Output 450 ml  Net 3635.42 ml   Filed Weights   02/03/17 0105  Weight: 69.9 kg (154 lb 1.6 oz)    Examination:  General exam: Comfortable sitting in chair, reporting improved symptoms Respiratory system: Clear to auscultation. Respiratory effort normal. Cardiovascular system: S1 & S2 heard, RRR. No JVD, murmurs, rubs, gallops or clicks. No pedal edema. Gastrointestinal system: Abdomen is nondistended, soft and nontender. No organomegaly or masses felt. Normal bowel sounds heard. Central nervous system: Alert and oriented. No focal neurological deficits. Extremities: Symmetric 5 x 5 power. Skin: No rashes, lesions or ulcers Psychiatry: Judgement and insight appear normal. Mood & affect appropriate.     Data Reviewed: I have personally reviewed following labs and imaging studies  CBC: Recent Labs  Lab 02/02/17 1831 02/03/17 0636 02/04/17 0520  WBC 14.5* 12.9* 10.8*  NEUTROABS 13.3*  --   --   HGB 12.2 10.9* 10.1*  HCT 36.1 32.3* 30.2*  MCV 94.8 94.2 93.8  PLT 74* 64* 60*   Basic Metabolic Panel: Recent Labs  Lab 02/02/17 1831 02/03/17 0636 02/04/17 0520  NA 134* 137 138  K 2.7* 2.6* 2.4*  CL 107 115* 115*  CO2 15* 14* 14*  GLUCOSE 142* 113* 104*  BUN 11 11 13     CREATININE 1.46* 1.06* 1.01*  CALCIUM 9.8 9.1 8.9  MG  --   --  1.9   GFR: Estimated Creatinine Clearance: 59 mL/min (A) (by C-G formula based on SCr of 1.01 mg/dL (H)). Liver Function Tests: Recent Labs  Lab 02/02/17 1831 02/03/17 0636  AST 67* 54*  ALT 25 25  ALKPHOS 212* 171*  BILITOT 2.4* 2.0*  PROT 7.0 5.8*  ALBUMIN 3.4* 2.8*    Sepsis Labs: Recent Labs  Lab 02/02/17 1857 02/02/17 2112 02/03/17 0049 02/03/17 0636 02/04/17 0520  PROCALCITON  --   --  0.25 0.24 0.29  LATICACIDVEN 3.80* 1.35  --   --   --     Recent Results (from the past 240 hour(s))  Culture, blood (  routine x 2)     Status: None (Preliminary result)   Collection Time: 02/02/17  8:58 PM  Result Value Ref Range Status   Specimen Description BLOOD RIGHT HAND  Final   Special Requests   Final    BOTTLES DRAWN AEROBIC AND ANAEROBIC Blood Culture adequate volume   Culture NO GROWTH < 12 HOURS  Final   Report Status PENDING  Incomplete  Culture, blood (routine x 2)     Status: None (Preliminary result)   Collection Time: 02/02/17  9:15 PM  Result Value Ref Range Status   Specimen Description BLOOD RIGHT ANTECUBITAL  Final   Special Requests   Final    BOTTLES DRAWN AEROBIC AND ANAEROBIC Blood Culture adequate volume   Culture NO GROWTH < 12 HOURS  Final   Report Status PENDING  Incomplete  Urine culture     Status: Abnormal   Collection Time: 02/02/17 10:12 PM  Result Value Ref Range Status   Specimen Description URINE, CLEAN CATCH  Final   Special Requests NONE  Final   Culture MULTIPLE SPECIES PRESENT, SUGGEST RECOLLECTION (A)  Final   Report Status 02/04/2017 FINAL  Final  C difficile quick scan w PCR reflex     Status: Abnormal   Collection Time: 02/03/17  8:01 AM  Result Value Ref Range Status   C Diff antigen POSITIVE (A) NEGATIVE Final   C Diff toxin POSITIVE (A) NEGATIVE Final   C Diff interpretation Toxin producing C. difficile detected.  Final    Comment: CRITICAL RESULT CALLED  TO, READ BACK BY AND VERIFIED WITH: Barbara Cower RN 53:97 6/73/41 ([email or web address])   Gastrointestinal Panel by PCR , Stool     Status: None   Collection Time: 02/03/17  8:01 AM  Result Value Ref Range Status   Campylobacter species NOT DETECTED NOT DETECTED Final   Plesimonas shigelloides NOT DETECTED NOT DETECTED Final   Salmonella species NOT DETECTED NOT DETECTED Final   Yersinia enterocolitica NOT DETECTED NOT DETECTED Final   Vibrio species NOT DETECTED NOT DETECTED Final   Vibrio cholerae NOT DETECTED NOT DETECTED Final   Enteroaggregative E coli (EAEC) NOT DETECTED NOT DETECTED Final   Enteropathogenic E coli (EPEC) NOT DETECTED NOT DETECTED Final   Enterotoxigenic E coli (ETEC) NOT DETECTED NOT DETECTED Final   Shiga like toxin producing E coli (STEC) NOT DETECTED NOT DETECTED Final   Shigella/Enteroinvasive E coli (EIEC) NOT DETECTED NOT DETECTED Final   Cryptosporidium NOT DETECTED NOT DETECTED Final   Cyclospora cayetanensis NOT DETECTED NOT DETECTED Final   Entamoeba histolytica NOT DETECTED NOT DETECTED Final   Giardia lamblia NOT DETECTED NOT DETECTED Final   Adenovirus F40/41 NOT DETECTED NOT DETECTED Final   Astrovirus NOT DETECTED NOT DETECTED Final   Norovirus GI/GII NOT DETECTED NOT DETECTED Final   Rotavirus A NOT DETECTED NOT DETECTED Final   Sapovirus (I, II, IV, and V) NOT DETECTED NOT DETECTED Final    Comment: Performed at [facility], [address] [place], [place] [address]  Respiratory Panel by PCR     Status: None   Collection Time: 02/03/17 11:02 PM  Result Value Ref Range Status   Adenovirus NOT DETECTED NOT DETECTED Final   Coronavirus 229E NOT DETECTED NOT DETECTED Final   Coronavirus HKU1 NOT DETECTED NOT DETECTED Final   Coronavirus NL63 NOT DETECTED NOT DETECTED Final   Coronavirus OC43 NOT DETECTED NOT DETECTED Final   Metapneumovirus NOT DETECTED NOT DETECTED Final   Rhinovirus / Enterovirus NOT  DETECTED NOT DETECTED Final   Influenza A  NOT DETECTED NOT DETECTED Final   Influenza B NOT DETECTED NOT DETECTED Final   Parainfluenza Virus 1 NOT DETECTED NOT DETECTED Final   Parainfluenza Virus 2 NOT DETECTED NOT DETECTED Final   Parainfluenza Virus 3 NOT DETECTED NOT DETECTED Final   Parainfluenza Virus 4 NOT DETECTED NOT DETECTED Final   Respiratory Syncytial Virus NOT DETECTED NOT DETECTED Final   Bordetella pertussis NOT DETECTED NOT DETECTED Final   Chlamydophila pneumoniae NOT DETECTED NOT DETECTED Final   Mycoplasma pneumoniae NOT DETECTED NOT DETECTED Final       Radiology Studies: Dg Chest 2 View  Result Date: 02/02/2017 CLINICAL DATA:  Dizziness and headaches.  Cough. EXAM: CHEST  2 VIEW COMPARISON:  Chest radiograph 12/18/2007 FINDINGS: Mild chronic bronchitic changes. No pleural effusion or pneumothorax. No focal airspace consolidation or pulmonary edema. IMPRESSION: No focal airspace disease. Electronically Signed   By: Ulyses Jarred M.D.   On: 02/02/2017 19:32   Ct Head Wo Contrast  Result Date: 02/02/2017 CLINICAL DATA:  Generalized weakness, nausea, vomiting and diarrhea x2 days. Patient fell out of bed and was unable to get up. EXAM: CT HEAD WITHOUT CONTRAST TECHNIQUE: Contiguous axial images were obtained from the base of the skull through the vertex without intravenous contrast. COMPARISON:  None. FINDINGS: BRAIN: The ventricles and sulci are normal. No intraparenchymal hemorrhage, mass effect nor midline shift. No acute large vascular territory infarcts. Grey-white matter distinction is maintained. The basal ganglia are unremarkable. No abnormal extra-axial fluid collections. Basal cisterns are not effaced and midline. The brainstem and cerebellar hemispheres are without acute abnormalities. VASCULAR: No hyperdense vessels. Mild atherosclerosis of the cavernous sinus carotids. SKULL/SOFT TISSUES: No skull fracture. No significant soft tissue swelling. ORBITS/SINUSES: The included ocular globes and orbital  contents are normal.The mastoid air cells are clear. Complete opacification of the right maxillary sinus with inspissated mucus and maxillary sinus wall thickening compatible with chronic sinusitis, less likely mucocele although there is slight medial bowing of the medial wall of the maxillary sinus. OTHER: None. IMPRESSION: No acute intracranial abnormality. Chronic right maxillary sinus disease. Electronically Signed   By: Ashley Royalty M.D.   On: 02/02/2017 22:10   Ct Chest Wo Contrast  Result Date: 02/03/2017 CLINICAL DATA:  Acute respiratory illness with dyspnea. Pulmonary edema suspected. EXAM: CT CHEST WITHOUT CONTRAST TECHNIQUE: Multidetector CT imaging of the chest was performed following the standard protocol without IV contrast. COMPARISON:  CXR 02/02/2017 FINDINGS: Cardiovascular: Cardiomegaly without pericardial effusion or thickening. Three-vessel coronary arteriosclerosis is noted. Minimal aortic atherosclerosis without aneurysm. The un opacified pulmonary arterial system is largely unremarkable. Mediastinum/Nodes: No enlarged mediastinal or axillary lymph nodes. Thyroid gland, trachea, and esophagus demonstrate no significant findings. Lungs/Pleura: Bibasilar left greater than right atelectasis. No pneumonic consolidation or overt pulmonary edema. Upper Abdomen: Right upper pole simple appearing renal cyst measuring at least 5.4 cm in diameter. Splenomegaly with the spleen measuring at least 15.5 cm in AP dimension. No acute abnormality in the upper abdomen. Probable cyst or hemangioma in the left hepatic lobe measuring 1.2 cm. Musculoskeletal: No acute osseous abnormality. IMPRESSION: 1. Cardiomegaly with coronary arteriosclerosis and aortic atherosclerosis. 2. No active pulmonary disease or CHF. 3. Hypodensities in the left hepatic lobe in the upper pole of right kidney are likely to represent cysts. Mild splenomegaly. Aortic Atherosclerosis (ICD10-I70.0). Electronically Signed   By: Ashley Royalty  M.D.   On: 02/03/2017 20:18    Scheduled Meds: . azithromycin  500  mg Oral Daily  . cholecalciferol  2,000 Units Oral Daily  . dextromethorphan-guaiFENesin  1 tablet Oral BID  . FLUoxetine  80 mg Oral Daily  . lactobacillus acidophilus  2 tablet Oral TID  . multivitamin with minerals  1 tablet Oral Daily  . potassium chloride SA  20 mEq Oral BID  . topiramate  50 mg Oral Daily  . vancomycin  125 mg Oral Q6H   Continuous Infusions: . sodium chloride 125 mL/hr at 02/04/17 0946     LOS: 1 day    Time spent: >25 minutes   Deatra James, MD [facility] Pager [phone]  If 7PM-7AM, please contact night-coverage [email or web address] Password [email or web address] 02/04/2017, 12:13 PM

## 2017-02-04 NOTE — Evaluation (Addendum)
Physical Therapy Evaluation Patient Details Name: Dana Watkins MRN: [id number] DOB: [birth year] Today's Date: 02/04/2017   History of Present Illness  62 yo female presenting to the ED with worsening general weakness, nausea/vomiting/diarrhea. C-Diff testing found to be positive. PMH migrane, pancytopenia, vitamin D deficiency   Clinical Impression   Patient received in bed with family present, covered in bruises which she and family report is from multiple falls over the course of the past few days since her symptoms started; of note, when her family is in the room she is very quiet and family tends to answer questions for her, however patient very talkative when family leaves the room. All bruises are purple in color and in places such as gleno-humoral head, elbow, knees, etc. She is able to perform functional bed mobility with supervision, but requires Min guard for safety in functional transfers and gait in her room; during gait period, patient had an episode of loose stools on the floor and was transferred to the bedside commode and able to toilet/perform self care while sitting on commode with general S. She continues to demonstrate poor safety awareness and judgement requiring verbal cues from PT to maintain safety during mobility and toileting. She was left in bed with all needs met, bed alarm activated. Due to extent of identified functional impairments, high fall risk/poor safety awareness, she may benefit from SNF care moving forward; however, as patient's functional status may improve directly along with medical status, PT plan to follow closely and provide updates to DC recommendations as appropriate.      Follow Up Recommendations SNF(to be updated as patient medically progresses, PT to follow closely )    Equipment Recommendations  None recommended by PT(defer to next venue )    Recommendations for Other Services       Precautions / Restrictions Precautions Precautions:  Fall Restrictions Weight Bearing Restrictions: No      Mobility  Bed Mobility Overal bed mobility: Needs Assistance Bed Mobility: Supine to Sit;Sit to Supine     Supine to sit: Supervision Sit to supine: Supervision   General bed mobility comments: S for safety   Transfers Overall transfer level: Needs assistance Equipment used: None Transfers: Sit to/from Stand Sit to Stand: Min guard         General transfer comment: general unsteadiness noted, poor safety awareness   Ambulation/Gait Ambulation/Gait assistance: Min guard Ambulation Distance (Feet): 12 Feet(34f, 64fin room ) Assistive device: None Gait Pattern/deviations: Step-through pattern;Decreased step length - right;Decreased step length - left;Decreased stance time - right;Decreased stance time - left;Trendelenburg;Drifts right/left;Trunk flexed;Narrow base of support     General Gait Details: very unsteady and unsafe with gait, patient reports she tried to use a walker at home but it made her fall; generlized unsteadiness, poor safety awareness   Stairs            Wheelchair Mobility    Modified Rankin (Stroke Patients Only)       Balance Overall balance assessment: Needs assistance Sitting-balance support: No upper extremity supported;Feet supported Sitting balance-Leahy Scale: Fair     Standing balance support: No upper extremity supported;During functional activity Standing balance-Leahy Scale: Poor Standing balance comment: generalized unsteadiness, high fall risk due to impaired balance and safety awareness                              Pertinent Vitals/Pain Pain Assessment: No/denies pain    Home Living Family/patient  expects to be discharged to:: Private residence Living Arrangements: Spouse/significant other Available Help at Discharge: Family;Available PRN/intermittently Type of Home: House Home Access: Stairs to enter Entrance Stairs-Rails: None Entrance  Stairs-Number of Steps: 2 Home Layout: Multi-level;Able to live on main level with bedroom/bathroom Home Equipment: None      Prior Function Level of Independence: Independent               Hand Dominance        Extremity/Trunk Assessment   Upper Extremity Assessment Upper Extremity Assessment: Generalized weakness    Lower Extremity Assessment Lower Extremity Assessment: Generalized weakness    Cervical / Trunk Assessment Cervical / Trunk Assessment: Normal  Communication   Communication: No difficulties  Cognition Arousal/Alertness: Awake/alert Behavior During Therapy: Anxious;Impulsive Overall Cognitive Status: Impaired/Different from baseline Area of Impairment: Safety/judgement                         Safety/Judgement: Decreased awareness of safety;Decreased awareness of deficits     General Comments: patient generally unsteady and impulsive today, requires cues from PT to maintain safety due to reduce awareness of deficits/fall hazards       General Comments General comments (skin integrity, edema, etc.): paitent covered with multiple bruises that she reports is from falling at home over the past 4 days; she reports she tried to use a walker but it made her fall; she reports she feels she is getting better and was fully independent before this hosptial admission     Exercises     Assessment/Plan    PT Assessment Patient needs continued PT services  PT Problem List Decreased strength;Decreased mobility;Decreased safety awareness;Decreased coordination;Decreased knowledge of precautions;Decreased balance;Decreased knowledge of use of DME       PT Treatment Interventions DME instruction;Therapeutic activities;Gait training;Therapeutic exercise;Patient/family education;Stair training;Balance training;Functional mobility training;Neuromuscular re-education    PT Goals (Current goals can be found in the Care Plan section)  Acute Rehab PT  Goals Patient Stated Goal: to get better/return to PLOF  PT Goal Formulation: With patient Time For Goal Achievement: 02/11/17 Potential to Achieve Goals: Good    Frequency Min 3X/week   Barriers to discharge        Co-evaluation               AM-PAC PT "6 Clicks" Daily Activity  Outcome Measure Difficulty turning over in bed (including adjusting bedclothes, sheets and blankets)?: Unable Difficulty moving from lying on back to sitting on the side of the bed? : Unable Difficulty sitting down on and standing up from a chair with arms (e.g., wheelchair, bedside commode, etc,.)?: Unable Help needed moving to and from a bed to chair (including a wheelchair)?: A Little Help needed walking in hospital room?: A Little Help needed climbing 3-5 steps with a railing? : A Lot 6 Click Score: 11    End of Session Equipment Utilized During Treatment: Gait belt Activity Tolerance: Patient tolerated treatment well Patient left: in bed;with bed alarm set;with call bell/phone within reach   PT Visit Diagnosis: Unsteadiness on feet (R26.81);History of falling (Z91.81);Repeated falls (R29.6);Other abnormalities of gait and mobility (R26.89);Muscle weakness (generalized) (M62.81)    Time: 0354-6568 PT Time Calculation (min) (ACUTE ONLY): 38 min   Charges:   PT Evaluation $PT Eval Moderate Complexity: 1 Mod PT Treatments $Gait Training: 8-22 mins $Self Care/Home Management: 8-22   PT G Codes:        Deniece Ree PT, DPT, CBIS  Supplemental Physical Therapist  [place]   Pager [phone]

## 2017-02-05 DIAGNOSIS — A09 Infectious gastroenteritis and colitis, unspecified: Secondary | ICD-10-CM

## 2017-02-05 DIAGNOSIS — A0472 Enterocolitis due to Clostridium difficile, not specified as recurrent: Secondary | ICD-10-CM

## 2017-02-05 DIAGNOSIS — J209 Acute bronchitis, unspecified: Secondary | ICD-10-CM

## 2017-02-05 LAB — CBC
HEMATOCRIT: 27.7 % — AB (ref 36.0–46.0)
HEMOGLOBIN: 9.5 g/dL — AB (ref 12.0–15.0)
MCH: 32 pg (ref 26.0–34.0)
MCHC: 34.3 g/dL (ref 30.0–36.0)
MCV: 93.3 fL (ref 78.0–100.0)
Platelets: 59 10*3/uL — ABNORMAL LOW (ref 150–400)
RBC: 2.97 MIL/uL — ABNORMAL LOW (ref 3.87–5.11)
RDW: 15.5 % (ref 11.5–15.5)
WBC: 5.9 10*3/uL (ref 4.0–10.5)

## 2017-02-05 LAB — BASIC METABOLIC PANEL
Anion gap: 6 (ref 5–15)
BUN: 11 mg/dL (ref 6–20)
CHLORIDE: 118 mmol/L — AB (ref 101–111)
CO2: 15 mmol/L — AB (ref 22–32)
CREATININE: 1 mg/dL (ref 0.44–1.00)
Calcium: 8.9 mg/dL (ref 8.9–10.3)
GFR calc Af Amer: >60 mL/min (ref >60)
GFR calc non Af Amer: 60 mL/min — ABNORMAL LOW (ref >60)
Glucose, Bld: 111 mg/dL — ABNORMAL HIGH (ref 65–99)
Potassium: 2.8 mmol/L — ABNORMAL LOW (ref 3.5–5.1)
Sodium: 139 mmol/L (ref 135–145)

## 2017-02-05 MED ORDER — DM-GUAIFENESIN ER 30-600 MG PO TB12: 1.0000 | ORAL_TABLET | Freq: Two times a day (BID) | ORAL | Status: DC | PRN

## 2017-02-05 MED ORDER — VANCOMYCIN 50 MG/ML ORAL SOLUTION: 125.0000 mg | Freq: Four times a day (QID) | ORAL | 0 refills | Status: DC

## 2017-02-05 MED ORDER — POTASSIUM CHLORIDE CRYS ER 20 MEQ PO TBCR
40.0000 meq | EXTENDED_RELEASE_TABLET | ORAL | Status: AC
  Administered 2017-02-05 (×2): 40 meq via ORAL
  Filled 2017-02-05 (×3): qty 2

## 2017-02-05 MED ORDER — POTASSIUM CHLORIDE CRYS ER 20 MEQ PO TBCR
40.0000 meq | EXTENDED_RELEASE_TABLET | ORAL | Status: AC
Start: 2017-02-05 — End: 2017-02-05
  Administered 2017-02-05 (×2): 40 meq via ORAL
  Filled 2017-02-05 (×2): qty 2

## 2017-02-05 MED ORDER — MAGNESIUM SULFATE 2 GM/50ML IV SOLN
2.0000 g | Freq: Once | INTRAVENOUS | Status: AC
  Administered 2017-02-05: 2 g via INTRAVENOUS
  Filled 2017-02-05: qty 50

## 2017-02-05 NOTE — Evaluation (Signed)
Occupational Therapy Evaluation Patient Details Name: Dana Watkins MRN: [id number] DOB: [birth year] Today's Date: 02/05/2017    History of Present Illness 62 yo female presenting to the ED with worsening general weakness, nausea/vomiting/diarrhea. C-Diff testing found to be positive. PMH migrane, pancytopenia, vitamin D deficiency    Clinical Impression   Pt reports she was independent with ADL PTA. Currently pt requires min guard assist for stand pivot transfer and min assist for LB bathing and peri care. Pt with poor safety awareness/insight into her deficits and unsteadiness in standing. Recommending SNF at this time for follow up; pending progress pt may be able to d/c home with [facility] follow up. Pt would benefit from continued skilled OT to address established goals.    Follow Up Recommendations  SNF;Supervision/Assistance - 24 hour(pending progress may be able to d/c home with [facility])    Equipment Recommendations  3 in 1 bedside commode    Recommendations for Other Services       Precautions / Restrictions Precautions Precautions: Fall Restrictions Weight Bearing Restrictions: No      Mobility Bed Mobility Overal bed mobility: Needs Assistance Bed Mobility: Supine to Sit;Sit to Supine     Supine to sit: Supervision Sit to supine: Supervision   General bed mobility comments: for safety  Transfers Overall transfer level: Needs assistance Equipment used: None Transfers: Sit to/from Stand;Stand Pivot Transfers Sit to Stand: Min guard Stand pivot transfers: Min guard       General transfer comment: min guard for safety, mild unsteadiness noted    Balance Overall balance assessment: Needs assistance Sitting-balance support: Feet supported;No upper extremity supported Sitting balance-Leahy Scale: Good     Standing balance support: No upper extremity supported;During functional activity Standing balance-Leahy Scale: Fair                              ADL either performed or assessed with clinical judgement   ADL Overall ADL's : Needs assistance/impaired Eating/Feeding: Independent;Sitting   Grooming: Supervision/safety;Sitting   Upper Body Bathing: Supervision/ safety;Sitting   Lower Body Bathing: Minimal assistance;Sit to/from stand   Upper Body Dressing : Supervision/safety;Sitting   Lower Body Dressing: Min guard;Sit to/from stand   Toilet Transfer: Min guard;Stand-pivot;BSC   Toileting- Water quality scientist and Hygiene: Minimal assistance;Sit to/from stand       Functional mobility during ADLs: Min guard(for stand pivot only)       Vision         Perception     Praxis      Pertinent Vitals/Pain Pain Assessment: No/denies pain     Hand Dominance     Extremity/Trunk Assessment Upper Extremity Assessment Upper Extremity Assessment: Generalized weakness   Lower Extremity Assessment Lower Extremity Assessment: Defer to PT evaluation   Cervical / Trunk Assessment Cervical / Trunk Assessment: Normal   Communication Communication Communication: No difficulties   Cognition Arousal/Alertness: Awake/alert Behavior During Therapy: WFL for tasks assessed/performed Overall Cognitive Status: No family/caregiver present to determine baseline cognitive functioning Area of Impairment: Safety/judgement                         Safety/Judgement: Decreased awareness of safety;Decreased awareness of deficits         General Comments       Exercises     Shoulder Instructions      Home Living Family/patient expects to be discharged to:: Private residence Living Arrangements: Spouse/significant other Available Help at  Discharge: Family;Available PRN/intermittently Type of Home: House Home Access: Stairs to enter [employer] of Steps: 2 Entrance Stairs-Rails: None Home Layout: Multi-level;Able to live on main level with bedroom/bathroom     Bathroom Shower/Tub: Medical illustrator: Standard     Home Equipment: None          Prior Functioning/Environment Level of Independence: Independent                 OT Problem List: Decreased strength;Impaired balance (sitting and/or standing);Decreased cognition;Decreased safety awareness;Decreased knowledge of use of DME or AE      OT Treatment/Interventions: Self-care/ADL training;Energy conservation;DME and/or AE instruction;Therapeutic activities;Patient/family education;Balance training    OT Goals(Current goals can be found in the care plan section) Acute Rehab OT Goals Patient Stated Goal: to get better/return to PLOF  OT Goal Formulation: With patient Time For Goal Achievement: 02/19/17 Potential to Achieve Goals: Good ADL Goals Pt Will Perform Grooming: with supervision;standing Pt Will Perform Upper Body Bathing: with modified independence;sitting Pt Will Perform Lower Body Bathing: with supervision;sit to/from stand Pt Will Transfer to Toilet: with supervision;ambulating;bedside commode Pt Will Perform Toileting - Clothing Manipulation and hygiene: with supervision;sit to/from stand  OT Frequency: Min 2X/week   Barriers to D/C:            Co-evaluation              AM-PAC PT "6 Clicks" Daily Activity     Outcome Measure Help from another person eating meals?: None Help from another person taking care of personal grooming?: A Little Help from another person toileting, which includes using toliet, bedpan, or urinal?: A Little Help from another person bathing (including washing, rinsing, drying)?: A Little Help from another person to put on and taking off regular upper body clothing?: None Help from another person to put on and taking off regular lower body clothing?: A Little 6 Click Score: 20   End of Session Nurse Communication: Mobility status  Activity Tolerance: Patient tolerated treatment well Patient left: in bed;with call bell/phone within reach;with bed alarm  set  OT Visit Diagnosis: Unsteadiness on feet (R26.81);History of falling (Z91.81)                Time: 9470-7615 OT Time Calculation (min): 23 min Charges:  OT General Charges $OT Visit: 1 Visit OT Evaluation $OT Eval Moderate Complexity: 1 Mod OT Treatments $Self Care/Home Management : 8-22 mins G-Codes:     Richards Pherigo A. Ulice Brilliant, M.S., OTR/L Pager: [place] 02/05/2017, 4:15 PM

## 2017-02-05 NOTE — Progress Notes (Signed)
PROGRESS NOTE    Dana Watkins   JXB:[id number]  DOB: [birth year]  DOA: 02/02/2017 PCP: Lawerance Cruel, MD   Brief Narrative:  Dana Watkins [name] Who was recently started on Ceftin for an acute bronchitis and presents for severe nausea/ vomiting and diarrhea. She is found to have c diff colitis.    Subjective: Cough has resolved completely. She has one loose stool last night and one again this AM.  ROS: no complaints of nausea, vomiting, constipation diarrhea, cough, dyspnea or dysuria. No other complaints.   Assessment & Plan:   Principal Problem:   Nausea vomiting and diarrhea/ C diff colitis - cont oral Vanc- has improved and BM frequency is decreasing - can be advances to a soft diet- hopefully home tomorrow if she tolerates this  Active Problems: Acute bronchitis - has completed 7 days of antibiotics- symptoms have resolved-  I will stop antibiotics.    Chronic Thrombocytopenia      Anemia of chronic illness - follow Hb    Hypokalemia - has chronically low K- K today is 2.4- Mg normal - will replace and recheck this evening to see if further replacement is needed   Metabolic acidosis - likely due to diarrhea-    DVT prophylaxis: SCDs Code Status: Full code Family Communication: with spouse Disposition Plan: home tomorrow Consultants:    Procedures:    Antimicrobials:  Anti-infectives (From admission, onward)   Start     Dose/Rate Route Frequency Ordered Stop   02/04/17 1000  azithromycin (ZITHROMAX) tablet 500 mg     500 mg Oral Daily 02/04/17 0841 02/09/17 0959   02/03/17 1330  vancomycin (VANCOCIN) 50 mg/mL oral solution 125 mg     125 mg Oral Every 6 hours 02/03/17 1324     02/03/17 1000  vancomycin (VANCOCIN) IVPB 750 mg/150 ml premix  Status:  Discontinued     750 mg 150 mL/hr over 60 Minutes Intravenous Every 12 hours 02/02/17 2059 02/03/17 1336   02/03/17 0400   piperacillin-tazobactam (ZOSYN) IVPB 3.375 g  Status:  Discontinued     3.375 g 12.5 mL/hr over 240 Minutes Intravenous Every 8 hours 02/02/17 2059 02/04/17 0840   02/02/17 2100  piperacillin-tazobactam (ZOSYN) IVPB 3.375 g     3.375 g 100 mL/hr over 30 Minutes Intravenous  Once 02/02/17 2046 02/02/17 2151   02/02/17 2100  vancomycin (VANCOCIN) IVPB 1000 mg/200 mL premix  Status:  Discontinued     1,000 mg 200 mL/hr over 60 Minutes Intravenous  Once 02/02/17 2046 02/02/17 2054   02/02/17 2100  vancomycin (VANCOCIN) 1,500 mg in sodium chloride 0.9 % 500 mL IVPB     1,500 mg 250 mL/hr over 120 Minutes Intravenous  Once 02/02/17 2054 02/03/17 0013       Objective: Vitals:   02/04/17 0630 02/04/17 1446 02/04/17 2200 02/05/17 0403  BP: 112/68 120/80 123/73 109/64  Pulse: 84 80 77 68  Resp: 17 18 18 18   Temp: 99 F (37.2 C) 98 F (36.7 C) 97.9 F (36.6 C) 98.2 F (36.8 C)  TempSrc: Oral Oral Oral Oral  SpO2: 98% 100% 100% 98%  Weight:      Height:        Intake/Output Summary (Last 24 hours) at 02/05/2017 1000 Last data filed at 02/05/2017 0433 Gross per 24 hour  Intake 3087.92 ml  Output 250 ml  Net 2837.92 ml   Filed Weights   02/03/17 0105  Weight: 69.9 kg (  154 lb 1.6 oz)    Examination: General exam: Appears comfortable  HEENT: PERRLA, oral mucosa moist, no sclera icterus or thrush Respiratory system: Clear to auscultation. Respiratory effort normal. Cardiovascular system: S1 & S2 heard, RRR.  No murmurs  Gastrointestinal system: Abdomen soft, non-tender, nondistended. Normal bowel sound. No organomegaly Central nervous system: Alert and oriented. No focal neurological deficits. Extremities: No cyanosis, clubbing or edema Skin: No rashes or ulcers Psychiatry:  Mood & affect appropriate.     Data Reviewed: I have personally reviewed following labs and imaging studies  CBC: Recent Labs  Lab 02/02/17 1831 02/03/17 0636 02/04/17 0520 02/05/17 0458  WBC 14.5*  12.9* 10.8* 5.9  NEUTROABS 13.3*  --   --   --   HGB 12.2 10.9* 10.1* 9.5*  HCT 36.1 32.3* 30.2* 27.7*  MCV 94.8 94.2 93.8 93.3  PLT 74* 64* 60* 59*   Basic Metabolic Panel: Recent Labs  Lab 02/02/17 1831 02/03/17 0636 02/04/17 0520  NA 134* 137 138  K 2.7* 2.6* 2.4*  CL 107 115* 115*  CO2 15* 14* 14*  GLUCOSE 142* 113* 104*  BUN 11 11 13   CREATININE 1.46* 1.06* 1.01*  CALCIUM 9.8 9.1 8.9  MG  --   --  1.9   GFR: Estimated Creatinine Clearance: 59 mL/min (A) (by C-G formula based on SCr of 1.01 mg/dL (H)). Liver Function Tests: Recent Labs  Lab 02/02/17 1831 02/03/17 0636  AST 67* 54*  ALT 25 25  ALKPHOS 212* 171*  BILITOT 2.4* 2.0*  PROT 7.0 5.8*  ALBUMIN 3.4* 2.8*   No results for input(s): LIPASE, AMYLASE in the last 168 hours. No results for input(s): AMMONIA in the last 168 hours. Coagulation Profile: No results for input(s): INR, PROTIME in the last 168 hours. Cardiac Enzymes: No results for input(s): CKTOTAL, CKMB, CKMBINDEX, TROPONINI in the last 168 hours. BNP (last 3 results) No results for input(s): PROBNP in the last 8760 hours. HbA1C: No results for input(s): HGBA1C in the last 72 hours. CBG: No results for input(s): GLUCAP in the last 168 hours. Lipid Profile: No results for input(s): CHOL, HDL, LDLCALC, TRIG, CHOLHDL, LDLDIRECT in the last 72 hours. Thyroid Function Tests: No results for input(s): TSH, T4TOTAL, FREET4, T3FREE, THYROIDAB in the last 72 hours. Anemia Panel: No results for input(s): VITAMINB12, FOLATE, FERRITIN, TIBC, IRON, RETICCTPCT in the last 72 hours. Urine analysis:    Component Value Date/Time   COLORURINE YELLOW 02/02/2017 1832   APPEARANCEUR HAZY (A) 02/02/2017 1832   LABSPEC 1.014 02/02/2017 1832   PHURINE 5.0 02/02/2017 1832   GLUCOSEU >=500 (A) 02/02/2017 1832   HGBUR MODERATE (A) 02/02/2017 1832   BILIRUBINUR NEGATIVE 02/02/2017 1832   KETONESUR 5 (A) 02/02/2017 1832   PROTEINUR 100 (A) 02/02/2017 1832    NITRITE NEGATIVE 02/02/2017 1832   LEUKOCYTESUR NEGATIVE 02/02/2017 1832   Sepsis Labs: @LABRCNTIP (procalcitonin:4,lacticidven:4) ) Recent Results (from the past 240 hour(s))  Culture, blood (routine x 2)     Status: None (Preliminary result)   Collection Time: 02/02/17  8:58 PM  Result Value Ref Range Status   Specimen Description BLOOD RIGHT HAND  Final   Special Requests   Final    BOTTLES DRAWN AEROBIC AND ANAEROBIC Blood Culture adequate volume   Culture NO GROWTH 2 DAYS  Final   Report Status PENDING  Incomplete  Culture, blood (routine x 2)     Status: None (Preliminary result)   Collection Time: 02/02/17  9:15 PM  Result Value Ref Range  Status   Specimen Description BLOOD RIGHT ANTECUBITAL  Final   Special Requests   Final    BOTTLES DRAWN AEROBIC AND ANAEROBIC Blood Culture adequate volume   Culture NO GROWTH 2 DAYS  Final   Report Status PENDING  Incomplete  Urine culture     Status: Abnormal   Collection Time: 02/02/17 10:12 PM  Result Value Ref Range Status   Specimen Description URINE, CLEAN CATCH  Final   Special Requests NONE  Final   Culture MULTIPLE SPECIES PRESENT, SUGGEST RECOLLECTION (A)  Final   Report Status 02/04/2017 FINAL  Final  C difficile quick scan w PCR reflex     Status: Abnormal   Collection Time: 02/03/17  8:01 AM  Result Value Ref Range Status   C Diff antigen POSITIVE (A) NEGATIVE Final   C Diff toxin POSITIVE (A) NEGATIVE Final   C Diff interpretation Toxin producing C. difficile detected.  Final    Comment: CRITICAL RESULT CALLED TO, READ BACK BY AND VERIFIED WITH: Barbara Cower RN 27:78 2/42/35 ([email or web address])   Gastrointestinal Panel by PCR , Stool     Status: None   Collection Time: 02/03/17  8:01 AM  Result Value Ref Range Status   Campylobacter species NOT DETECTED NOT DETECTED Final   Plesimonas shigelloides NOT DETECTED NOT DETECTED Final   Salmonella species NOT DETECTED NOT DETECTED Final   Yersinia enterocolitica NOT DETECTED NOT  DETECTED Final   Vibrio species NOT DETECTED NOT DETECTED Final   Vibrio cholerae NOT DETECTED NOT DETECTED Final   Enteroaggregative E coli (EAEC) NOT DETECTED NOT DETECTED Final   Enteropathogenic E coli (EPEC) NOT DETECTED NOT DETECTED Final   Enterotoxigenic E coli (ETEC) NOT DETECTED NOT DETECTED Final   Shiga like toxin producing E coli (STEC) NOT DETECTED NOT DETECTED Final   Shigella/Enteroinvasive E coli (EIEC) NOT DETECTED NOT DETECTED Final   Cryptosporidium NOT DETECTED NOT DETECTED Final   Cyclospora cayetanensis NOT DETECTED NOT DETECTED Final   Entamoeba histolytica NOT DETECTED NOT DETECTED Final   Giardia lamblia NOT DETECTED NOT DETECTED Final   Adenovirus F40/41 NOT DETECTED NOT DETECTED Final   Astrovirus NOT DETECTED NOT DETECTED Final   Norovirus GI/GII NOT DETECTED NOT DETECTED Final   Rotavirus A NOT DETECTED NOT DETECTED Final   Sapovirus (I, II, IV, and V) NOT DETECTED NOT DETECTED Final    Comment: Performed at [facility], [address] [place], [place] [address]  Respiratory Panel by PCR     Status: None   Collection Time: 02/03/17 11:02 PM  Result Value Ref Range Status   Adenovirus NOT DETECTED NOT DETECTED Final   Coronavirus 229E NOT DETECTED NOT DETECTED Final   Coronavirus HKU1 NOT DETECTED NOT DETECTED Final   Coronavirus NL63 NOT DETECTED NOT DETECTED Final   Coronavirus OC43 NOT DETECTED NOT DETECTED Final   Metapneumovirus NOT DETECTED NOT DETECTED Final   Rhinovirus / Enterovirus NOT DETECTED NOT DETECTED Final   Influenza A NOT DETECTED NOT DETECTED Final   Influenza B NOT DETECTED NOT DETECTED Final   Parainfluenza Virus 1 NOT DETECTED NOT DETECTED Final   Parainfluenza Virus 2 NOT DETECTED NOT DETECTED Final   Parainfluenza Virus 3 NOT DETECTED NOT DETECTED Final   Parainfluenza Virus 4 NOT DETECTED NOT DETECTED Final   Respiratory Syncytial Virus NOT DETECTED NOT DETECTED Final   Bordetella pertussis NOT DETECTED NOT DETECTED  Final   Chlamydophila pneumoniae NOT DETECTED NOT DETECTED Final   Mycoplasma pneumoniae NOT DETECTED NOT  DETECTED Final         Radiology Studies: Ct Chest Wo Contrast  Result Date: 02/03/2017 CLINICAL DATA:  Acute respiratory illness with dyspnea. Pulmonary edema suspected. EXAM: CT CHEST WITHOUT CONTRAST TECHNIQUE: Multidetector CT imaging of the chest was performed following the standard protocol without IV contrast. COMPARISON:  CXR 02/02/2017 FINDINGS: Cardiovascular: Cardiomegaly without pericardial effusion or thickening. Three-vessel coronary arteriosclerosis is noted. Minimal aortic atherosclerosis without aneurysm. The un opacified pulmonary arterial system is largely unremarkable. Mediastinum/Nodes: No enlarged mediastinal or axillary lymph nodes. Thyroid gland, trachea, and esophagus demonstrate no significant findings. Lungs/Pleura: Bibasilar left greater than right atelectasis. No pneumonic consolidation or overt pulmonary edema. Upper Abdomen: Right upper pole simple appearing renal cyst measuring at least 5.4 cm in diameter. Splenomegaly with the spleen measuring at least 15.5 cm in AP dimension. No acute abnormality in the upper abdomen. Probable cyst or hemangioma in the left hepatic lobe measuring 1.2 cm. Musculoskeletal: No acute osseous abnormality. IMPRESSION: 1. Cardiomegaly with coronary arteriosclerosis and aortic atherosclerosis. 2. No active pulmonary disease or CHF. 3. Hypodensities in the left hepatic lobe in the upper pole of right kidney are likely to represent cysts. Mild splenomegaly. Aortic Atherosclerosis (ICD10-I70.0). Electronically Signed   By: Ashley Royalty M.D.   On: 02/03/2017 20:18      Scheduled Meds: . azithromycin  500 mg Oral Daily  . cholecalciferol  2,000 Units Oral Daily  . dextromethorphan-guaiFENesin  1 tablet Oral BID  . FLUoxetine  80 mg Oral Daily  . lactobacillus acidophilus  2 tablet Oral TID  . multivitamin with minerals  1 tablet Oral  Daily  . potassium chloride SA  20 mEq Oral BID  . potassium chloride  40 mEq Oral Q4H  . topiramate  50 mg Oral Daily  . vancomycin  125 mg Oral Q6H   Continuous Infusions: . sodium chloride 125 mL/hr at 02/05/17 0432     LOS: 2 days    Time spent in minutes: 35    Debbe Odea, MD [facility] Pager: [email or web address] Password [email or web address] 02/05/2017, 10:00 AM

## 2017-02-06 DIAGNOSIS — E860 Dehydration: Secondary | ICD-10-CM

## 2017-02-06 DIAGNOSIS — J209 Acute bronchitis, unspecified: Secondary | ICD-10-CM

## 2017-02-06 DIAGNOSIS — A419 Sepsis, unspecified organism: Secondary | ICD-10-CM

## 2017-02-06 DIAGNOSIS — D638 Anemia in other chronic diseases classified elsewhere: Secondary | ICD-10-CM

## 2017-02-06 LAB — BASIC METABOLIC PANEL
ANION GAP: 8 (ref 5–15)
BUN: 10 mg/dL (ref 6–20)
CALCIUM: 8.5 mg/dL — AB (ref 8.9–10.3)
CO2: 14 mmol/L — ABNORMAL LOW (ref 22–32)
Chloride: 117 mmol/L — ABNORMAL HIGH (ref 101–111)
Creatinine, Ser: 0.9 mg/dL (ref 0.44–1.00)
GFR calc Af Amer: >60 mL/min (ref >60)
Glucose, Bld: 87 mg/dL (ref 65–99)
Potassium: 3.5 mmol/L (ref 3.5–5.1)
SODIUM: 139 mmol/L (ref 135–145)

## 2017-02-06 LAB — CBC
HCT: 28.4 % — ABNORMAL LOW (ref 36.0–46.0)
Hemoglobin: 9.7 g/dL — ABNORMAL LOW (ref 12.0–15.0)
MCH: 31.9 pg (ref 26.0–34.0)
MCHC: 34.2 g/dL (ref 30.0–36.0)
MCV: 93.4 fL (ref 78.0–100.0)
PLATELETS: 74 10*3/uL — AB (ref 150–400)
RBC: 3.04 MIL/uL — AB (ref 3.87–5.11)
RDW: 15.8 % — AB (ref 11.5–15.5)
WBC: 5.1 10*3/uL (ref 4.0–10.5)

## 2017-02-06 MED ORDER — BACID PO TABS: 2.0000 | ORAL_TABLET | Freq: Three times a day (TID) | ORAL | 0 refills | Status: DC

## 2017-02-06 NOTE — Discharge Summary (Signed)
Physician Discharge Summary  Dana Watkins:[id number] DOB: Jul 12, 1955 DOA: 02/02/2017  PCP: Dana Cruel, MD  Admit date: 02/02/2017 Discharge date: 02/06/2017  Admitted From: home Disposition:  home   Recommendations for Outpatient Follow-up:  1. F/u on K in 3-4 days  Home Health:  ordered    Discharge Condition:  stable   CODE STATUS:  Full code   Consultations:  none    Discharge Diagnoses:  Principal Problem:   Sepsis ([place]) Active Problems:   C. difficile colitis   Acute bronchitis   Nausea vomiting and diarrhea   Diarrhea   Thrombocytopenia     Anemia of chronic illness   Hypokalemia  Subjective: Diarrhea has improved. No abdominal pain, nausea or vomiting.   Brief Summary: Dana Watkins [name] Who was recently started on Ceftin for an acute bronchitis and presents for severe nausea/ vomiting and diarrhea. She is found to have c diff colitis.   Hospital Course:  Principal Problem:   Nausea vomiting and diarrhea/ C diff colitis - cont oral Vanc- has improved and BM frequency has decreased - has been advanced to a soft diet and is tolerating it well  Active Problems: Acute bronchitis - has completed 7 days of antibiotics- symptoms have resolved-  I have stopped antibiotics.    Chronic Thrombocytopenia   - stable    Anemia of chronic illness - follow Hb    Hypokalemia - has chronically low K- K noted to be 2.4 - replaced and normalized- she has K replacements at home which she will continue   Metabolic acidosis - likely due to diarrhea and should improve steadily  Discharge Exam: Vitals:   02/06/17 0604 02/06/17 0817  BP: 110/71 127/79  Pulse: 77 77  Resp: 17   Temp: 98 F (36.7 C) 98.2 F (36.8 C)  SpO2: 97% 100%   Vitals:   02/05/17 1358 02/05/17 1900 02/06/17 0604 02/06/17 0817  BP: 120/75 120/82 110/71 127/79  Pulse: 77 81 77 77  Resp:   17   Temp: 98.1 F  (36.7 C) 98.1 F (36.7 C) 98 F (36.7 C) 98.2 F (36.8 C)  TempSrc: Oral Oral  Oral  SpO2: 100% 98% 97% 100%  Weight:      Height:        General: Pt is alert, awake, not in acute distress Cardiovascular: RRR, S1/S2 +, no rubs, no gallops Respiratory: CTA bilaterally, no wheezing, no rhonchi Abdominal: Soft, NT, ND, bowel sounds + Extremities: no edema, no cyanosis   Discharge Instructions  Discharge Instructions    Diet - low sodium heart healthy   Complete by:  As directed    Increase activity slowly   Complete by:  As directed      Allergies as of 02/06/2017      Reactions   Sulfa Antibiotics Rash      Medication List    STOP taking these medications   cefUROXime 250 MG tablet Commonly known as:  CEFTIN   diazepam 10 MG tablet Commonly known as:  VALIUM   potassium chloride SA 20 MEQ tablet Commonly known as:  K-DUR,KLOR-CON     TAKE these medications   butalbital-acetaminophen-caffeine 50-325-40 MG tablet Commonly known as:  FIORICET, ESGIC Take 1 tablet by mouth 3 (three) times daily as needed for headache.   FLUoxetine 20 MG capsule Commonly known as:  PROZAC Take 80 mg by mouth daily.   lactobacillus acidophilus Tabs tablet Take 2 tablets by  mouth 3 (three) times daily.   multivitamin tablet Take 1 tablet by mouth daily.   potassium chloride 10 MEQ CR capsule Commonly known as:  MICRO-K Take 20 mEq by mouth 2 (two) times daily.   topiramate 50 MG tablet Commonly known as:  TOPAMAX Take 50 mg by mouth daily.   vancomycin 50 mg/mL oral solution Commonly known as:  VANCOCIN Take 2.5 mLs (125 mg total) by mouth every 6 (six) hours.   Vitamin D3 2000 units Tabs Take 1 tablet by mouth daily.       Allergies  Allergen Reactions  . Sulfa Antibiotics Rash     Procedures/Studies:    Dg Chest 2 View  Result Date: 02/02/2017 CLINICAL DATA:  Dizziness and headaches.  Cough. EXAM: CHEST  2 VIEW COMPARISON:  Chest radiograph 12/18/2007  FINDINGS: Mild chronic bronchitic changes. No pleural effusion or pneumothorax. No focal airspace consolidation or pulmonary edema. IMPRESSION: No focal airspace disease. Electronically Signed   By: Ulyses Jarred M.D.   On: 02/02/2017 19:32   Ct Head Wo Contrast  Result Date: 02/02/2017 CLINICAL DATA:  Generalized weakness, nausea, vomiting and diarrhea x2 days. Patient fell out of bed and was unable to get up. EXAM: CT HEAD WITHOUT CONTRAST TECHNIQUE: Contiguous axial images were obtained from the base of the skull through the vertex without intravenous contrast. COMPARISON:  None. FINDINGS: BRAIN: The ventricles and sulci are normal. No intraparenchymal hemorrhage, mass effect nor midline shift. No acute large vascular territory infarcts. Grey-white matter distinction is maintained. The basal ganglia are unremarkable. No abnormal extra-axial fluid collections. Basal cisterns are not effaced and midline. The brainstem and cerebellar hemispheres are without acute abnormalities. VASCULAR: No hyperdense vessels. Mild atherosclerosis of the cavernous sinus carotids. SKULL/SOFT TISSUES: No skull fracture. No significant soft tissue swelling. ORBITS/SINUSES: The included ocular globes and orbital contents are normal.The mastoid air cells are clear. Complete opacification of the right maxillary sinus with inspissated mucus and maxillary sinus wall thickening compatible with chronic sinusitis, less likely mucocele although there is slight medial bowing of the medial wall of the maxillary sinus. OTHER: None. IMPRESSION: No acute intracranial abnormality. Chronic right maxillary sinus disease. Electronically Signed   By: Ashley Royalty M.D.   On: 02/02/2017 22:10   Ct Chest Wo Contrast  Result Date: 02/03/2017 CLINICAL DATA:  Acute respiratory illness with dyspnea. Pulmonary edema suspected. EXAM: CT CHEST WITHOUT CONTRAST TECHNIQUE: Multidetector CT imaging of the chest was performed following the standard protocol  without IV contrast. COMPARISON:  CXR 02/02/2017 FINDINGS: Cardiovascular: Cardiomegaly without pericardial effusion or thickening. Three-vessel coronary arteriosclerosis is noted. Minimal aortic atherosclerosis without aneurysm. The un opacified pulmonary arterial system is largely unremarkable. Mediastinum/Nodes: No enlarged mediastinal or axillary lymph nodes. Thyroid gland, trachea, and esophagus demonstrate no significant findings. Lungs/Pleura: Bibasilar left greater than right atelectasis. No pneumonic consolidation or overt pulmonary edema. Upper Abdomen: Right upper pole simple appearing renal cyst measuring at least 5.4 cm in diameter. Splenomegaly with the spleen measuring at least 15.5 cm in AP dimension. No acute abnormality in the upper abdomen. Probable cyst or hemangioma in the left hepatic lobe measuring 1.2 cm. Musculoskeletal: No acute osseous abnormality. IMPRESSION: 1. Cardiomegaly with coronary arteriosclerosis and aortic atherosclerosis. 2. No active pulmonary disease or CHF. 3. Hypodensities in the left hepatic lobe in the upper pole of right kidney are likely to represent cysts. Mild splenomegaly. Aortic Atherosclerosis (ICD10-I70.0). Electronically Signed   By: Ashley Royalty M.D.   On: 02/03/2017 20:18  The results of significant diagnostics from this hospitalization (including imaging, microbiology, ancillary and laboratory) are listed below for reference.     Microbiology: Recent Results (from the past 240 hour(s))  Culture, blood (routine x 2)     Status: None (Preliminary result)   Collection Time: 02/02/17  8:58 PM  Result Value Ref Range Status   Specimen Description BLOOD RIGHT HAND  Final   Special Requests   Final    BOTTLES DRAWN AEROBIC AND ANAEROBIC Blood Culture adequate volume   Culture NO GROWTH 3 DAYS  Final   Report Status PENDING  Incomplete  Culture, blood (routine x 2)     Status: None (Preliminary result)   Collection Time: 02/02/17  9:15 PM  Result  Value Ref Range Status   Specimen Description BLOOD RIGHT ANTECUBITAL  Final   Special Requests   Final    BOTTLES DRAWN AEROBIC AND ANAEROBIC Blood Culture adequate volume   Culture NO GROWTH 3 DAYS  Final   Report Status PENDING  Incomplete  Urine culture     Status: Abnormal   Collection Time: 02/02/17 10:12 PM  Result Value Ref Range Status   Specimen Description URINE, CLEAN CATCH  Final   Special Requests NONE  Final   Culture MULTIPLE SPECIES PRESENT, SUGGEST RECOLLECTION (A)  Final   Report Status 02/04/2017 FINAL  Final  C difficile quick scan w PCR reflex     Status: Abnormal   Collection Time: 02/03/17  8:01 AM  Result Value Ref Range Status   C Diff antigen POSITIVE (A) NEGATIVE Final   C Diff toxin POSITIVE (A) NEGATIVE Final   C Diff interpretation Toxin producing C. difficile detected.  Final    Comment: CRITICAL RESULT CALLED TO, READ BACK BY AND VERIFIED WITH: Barbara Cower RN 16:10 9/60/45 ([email or web address])   Gastrointestinal Panel by PCR , Stool     Status: None   Collection Time: 02/03/17  8:01 AM  Result Value Ref Range Status   Campylobacter species NOT DETECTED NOT DETECTED Final   Plesimonas shigelloides NOT DETECTED NOT DETECTED Final   Salmonella species NOT DETECTED NOT DETECTED Final   Yersinia enterocolitica NOT DETECTED NOT DETECTED Final   Vibrio species NOT DETECTED NOT DETECTED Final   Vibrio cholerae NOT DETECTED NOT DETECTED Final   Enteroaggregative E coli (EAEC) NOT DETECTED NOT DETECTED Final   Enteropathogenic E coli (EPEC) NOT DETECTED NOT DETECTED Final   Enterotoxigenic E coli (ETEC) NOT DETECTED NOT DETECTED Final   Shiga like toxin producing E coli (STEC) NOT DETECTED NOT DETECTED Final   Shigella/Enteroinvasive E coli (EIEC) NOT DETECTED NOT DETECTED Final   Cryptosporidium NOT DETECTED NOT DETECTED Final   Cyclospora cayetanensis NOT DETECTED NOT DETECTED Final   Entamoeba histolytica NOT DETECTED NOT DETECTED Final   Giardia lamblia NOT  DETECTED NOT DETECTED Final   Adenovirus F40/41 NOT DETECTED NOT DETECTED Final   Astrovirus NOT DETECTED NOT DETECTED Final   Norovirus GI/GII NOT DETECTED NOT DETECTED Final   Rotavirus A NOT DETECTED NOT DETECTED Final   Sapovirus (I, II, IV, and V) NOT DETECTED NOT DETECTED Final    Comment: Performed at [facility], [address] [place], [place] [address]  Respiratory Panel by PCR     Status: None   Collection Time: 02/03/17 11:02 PM  Result Value Ref Range Status   Adenovirus NOT DETECTED NOT DETECTED Final   Coronavirus 229E NOT DETECTED NOT DETECTED Final   Coronavirus HKU1 NOT DETECTED NOT DETECTED  Final   Coronavirus NL63 NOT DETECTED NOT DETECTED Final   Coronavirus OC43 NOT DETECTED NOT DETECTED Final   Metapneumovirus NOT DETECTED NOT DETECTED Final   Rhinovirus / Enterovirus NOT DETECTED NOT DETECTED Final   Influenza A NOT DETECTED NOT DETECTED Final   Influenza B NOT DETECTED NOT DETECTED Final   Parainfluenza Virus 1 NOT DETECTED NOT DETECTED Final   Parainfluenza Virus 2 NOT DETECTED NOT DETECTED Final   Parainfluenza Virus 3 NOT DETECTED NOT DETECTED Final   Parainfluenza Virus 4 NOT DETECTED NOT DETECTED Final   Respiratory Syncytial Virus NOT DETECTED NOT DETECTED Final   Bordetella pertussis NOT DETECTED NOT DETECTED Final   Chlamydophila pneumoniae NOT DETECTED NOT DETECTED Final   Mycoplasma pneumoniae NOT DETECTED NOT DETECTED Final     Labs: BNP (last 3 results) No results for input(s): BNP in the last 8760 hours. Basic Metabolic Panel: Recent Labs  Lab 02/02/17 1831 02/03/17 0636 02/04/17 0520 02/05/17 1609 02/06/17 0742  NA 134* 137 138 139 139  K 2.7* 2.6* 2.4* 2.8* 3.5  CL 107 115* 115* 118* 117*  CO2 15* 14* 14* 15* 14*  GLUCOSE 142* 113* 104* 111* 87  BUN 11 11 13 11 10   CREATININE 1.46* 1.06* 1.01* 1.00 0.90  CALCIUM 9.8 9.1 8.9 8.9 8.5*  MG  --   --  1.9  --   --    Liver Function Tests: Recent Labs  Lab  02/02/17 1831 02/03/17 0636  AST 67* 54*  ALT 25 25  ALKPHOS 212* 171*  BILITOT 2.4* 2.0*  PROT 7.0 5.8*  ALBUMIN 3.4* 2.8*   No results for input(s): LIPASE, AMYLASE in the last 168 hours. No results for input(s): AMMONIA in the last 168 hours. CBC: Recent Labs  Lab 02/02/17 1831 02/03/17 0636 02/04/17 0520 02/05/17 0458 02/06/17 0749  WBC 14.5* 12.9* 10.8* 5.9 5.1  NEUTROABS 13.3*  --   --   --   --   HGB 12.2 10.9* 10.1* 9.5* 9.7*  HCT 36.1 32.3* 30.2* 27.7* 28.4*  MCV 94.8 94.2 93.8 93.3 93.4  PLT 74* 64* 60* 59* 74*   Cardiac Enzymes: No results for input(s): CKTOTAL, CKMB, CKMBINDEX, TROPONINI in the last 168 hours. BNP: Invalid input(s): POCBNP CBG: No results for input(s): GLUCAP in the last 168 hours. D-Dimer No results for input(s): DDIMER in the last 72 hours. Hgb A1c No results for input(s): HGBA1C in the last 72 hours. Lipid Profile No results for input(s): CHOL, HDL, LDLCALC, TRIG, CHOLHDL, LDLDIRECT in the last 72 hours. Thyroid function studies No results for input(s): TSH, T4TOTAL, T3FREE, THYROIDAB in the last 72 hours.  Invalid input(s): FREET3 Anemia work up No results for input(s): VITAMINB12, FOLATE, FERRITIN, TIBC, IRON, RETICCTPCT in the last 72 hours. Urinalysis    Component Value Date/Time   COLORURINE YELLOW 02/02/2017 1832   APPEARANCEUR HAZY (A) 02/02/2017 1832   LABSPEC 1.014 02/02/2017 1832   PHURINE 5.0 02/02/2017 1832   GLUCOSEU >=500 (A) 02/02/2017 1832   HGBUR MODERATE (A) 02/02/2017 1832   BILIRUBINUR NEGATIVE 02/02/2017 1832   KETONESUR 5 (A) 02/02/2017 1832   PROTEINUR 100 (A) 02/02/2017 1832   NITRITE NEGATIVE 02/02/2017 1832   LEUKOCYTESUR NEGATIVE 02/02/2017 1832   Sepsis Labs Invalid input(s): PROCALCITONIN,  WBC,  LACTICIDVEN Microbiology Recent Results (from the past 240 hour(s))  Culture, blood (routine x 2)     Status: None (Preliminary result)   Collection Time: 02/02/17  8:58 PM  Result Value Ref Range  Status   Specimen Description BLOOD RIGHT HAND  Final   Special Requests   Final    BOTTLES DRAWN AEROBIC AND ANAEROBIC Blood Culture adequate volume   Culture NO GROWTH 3 DAYS  Final   Report Status PENDING  Incomplete  Culture, blood (routine x 2)     Status: None (Preliminary result)   Collection Time: 02/02/17  9:15 PM  Result Value Ref Range Status   Specimen Description BLOOD RIGHT ANTECUBITAL  Final   Special Requests   Final    BOTTLES DRAWN AEROBIC AND ANAEROBIC Blood Culture adequate volume   Culture NO GROWTH 3 DAYS  Final   Report Status PENDING  Incomplete  Urine culture     Status: Abnormal   Collection Time: 02/02/17 10:12 PM  Result Value Ref Range Status   Specimen Description URINE, CLEAN CATCH  Final   Special Requests NONE  Final   Culture MULTIPLE SPECIES PRESENT, SUGGEST RECOLLECTION (A)  Final   Report Status 02/04/2017 FINAL  Final  C difficile quick scan w PCR reflex     Status: Abnormal   Collection Time: 02/03/17  8:01 AM  Result Value Ref Range Status   C Diff antigen POSITIVE (A) NEGATIVE Final   C Diff toxin POSITIVE (A) NEGATIVE Final   C Diff interpretation Toxin producing C. difficile detected.  Final    Comment: CRITICAL RESULT CALLED TO, READ BACK BY AND VERIFIED WITH: Barbara Cower RN 41:66 0/63/01 ([email or web address])   Gastrointestinal Panel by PCR , Stool     Status: None   Collection Time: 02/03/17  8:01 AM  Result Value Ref Range Status   Campylobacter species NOT DETECTED NOT DETECTED Final   Plesimonas shigelloides NOT DETECTED NOT DETECTED Final   Salmonella species NOT DETECTED NOT DETECTED Final   Yersinia enterocolitica NOT DETECTED NOT DETECTED Final   Vibrio species NOT DETECTED NOT DETECTED Final   Vibrio cholerae NOT DETECTED NOT DETECTED Final   Enteroaggregative E coli (EAEC) NOT DETECTED NOT DETECTED Final   Enteropathogenic E coli (EPEC) NOT DETECTED NOT DETECTED Final   Enterotoxigenic E coli (ETEC) NOT DETECTED NOT DETECTED Final    Shiga like toxin producing E coli (STEC) NOT DETECTED NOT DETECTED Final   Shigella/Enteroinvasive E coli (EIEC) NOT DETECTED NOT DETECTED Final   Cryptosporidium NOT DETECTED NOT DETECTED Final   Cyclospora cayetanensis NOT DETECTED NOT DETECTED Final   Entamoeba histolytica NOT DETECTED NOT DETECTED Final   Giardia lamblia NOT DETECTED NOT DETECTED Final   Adenovirus F40/41 NOT DETECTED NOT DETECTED Final   Astrovirus NOT DETECTED NOT DETECTED Final   Norovirus GI/GII NOT DETECTED NOT DETECTED Final   Rotavirus A NOT DETECTED NOT DETECTED Final   Sapovirus (I, II, IV, and V) NOT DETECTED NOT DETECTED Final    Comment: Performed at [facility], [address] [place], [place] [address]  Respiratory Panel by PCR     Status: None   Collection Time: 02/03/17 11:02 PM  Result Value Ref Range Status   Adenovirus NOT DETECTED NOT DETECTED Final   Coronavirus 229E NOT DETECTED NOT DETECTED Final   Coronavirus HKU1 NOT DETECTED NOT DETECTED Final   Coronavirus NL63 NOT DETECTED NOT DETECTED Final   Coronavirus OC43 NOT DETECTED NOT DETECTED Final   Metapneumovirus NOT DETECTED NOT DETECTED Final   Rhinovirus / Enterovirus NOT DETECTED NOT DETECTED Final   Influenza A NOT DETECTED NOT DETECTED Final   Influenza B NOT DETECTED NOT DETECTED Final   Parainfluenza Virus  1 NOT DETECTED NOT DETECTED Final   Parainfluenza Virus 2 NOT DETECTED NOT DETECTED Final   Parainfluenza Virus 3 NOT DETECTED NOT DETECTED Final   Parainfluenza Virus 4 NOT DETECTED NOT DETECTED Final   Respiratory Syncytial Virus NOT DETECTED NOT DETECTED Final   Bordetella pertussis NOT DETECTED NOT DETECTED Final   Chlamydophila pneumoniae NOT DETECTED NOT DETECTED Final   Mycoplasma pneumoniae NOT DETECTED NOT DETECTED Final     Time coordinating discharge: Over 30 minutes  SIGNED:   Debbe Odea, MD  [facility] 02/06/2017, 11:28 AM Pager   If 7PM-7AM, please contact  night-coverage [email or web address] Password [email or web address]

## 2017-02-06 NOTE — Progress Notes (Signed)
Patient discharged to home with instructions. 

## 2017-02-07 LAB — CULTURE, BLOOD (ROUTINE X 2)
CULTURE: NO GROWTH
CULTURE: NO GROWTH
SPECIAL REQUESTS: ADEQUATE
Special Requests: ADEQUATE

## 2017-02-07 LAB — GLUCOSE, CAPILLARY: GLUCOSE-CAPILLARY: 138 mg/dL — AB (ref 65–99)

## 2017-02-16 ENCOUNTER — Telehealth: Payer: Self-pay | Admitting: *Deleted

## 2017-02-16 NOTE — Telephone Encounter (Signed)
REFERRAL SENT TO SCHEDULING.

## 2017-03-08 ENCOUNTER — Encounter: Payer: Self-pay | Admitting: Cardiology

## 2017-03-08 ENCOUNTER — Encounter: Payer: Self-pay | Admitting: *Deleted

## 2017-03-08 ENCOUNTER — Ambulatory Visit: Payer: BLUE CROSS/BLUE SHIELD | Admitting: Cardiology

## 2017-03-08 VITALS — BP 130/80 | HR 72 | Ht 68.0 in | Wt 146.0 lb

## 2017-03-08 DIAGNOSIS — I2510 Atherosclerotic heart disease of native coronary artery without angina pectoris: Secondary | ICD-10-CM | POA: Diagnosis not present

## 2017-03-08 DIAGNOSIS — R002 Palpitations: Secondary | ICD-10-CM | POA: Diagnosis not present

## 2017-03-08 DIAGNOSIS — R0609 Other forms of dyspnea: Secondary | ICD-10-CM | POA: Diagnosis not present

## 2017-03-08 DIAGNOSIS — R0600 Dyspnea, unspecified: Secondary | ICD-10-CM

## 2017-03-08 DIAGNOSIS — R5383 Other fatigue: Secondary | ICD-10-CM

## 2017-03-08 DIAGNOSIS — I517 Cardiomegaly: Secondary | ICD-10-CM

## 2017-03-08 MED ORDER — METOPROLOL TARTRATE 50 MG PO TABS: 50.0000 mg | ORAL_TABLET | Freq: Once | ORAL | 0 refills | Status: DC

## 2017-03-08 NOTE — Progress Notes (Signed)
Cardiology Office Note:    Date:  03/09/2017   ID:  Renu, Asby [birth year], MRN [id number]  PCP:  Lawerance Cruel, MD  Cardiologist:  No primary care provider on file.   Referring MD: Lawerance Cruel, MD   Reason for visit: Post-hospitalization follow up  History of Present Illness:    Dana Watkins is a 62 y.o. female with a hx ITP< chronic thrombocytopenia, of recent hospitalization in January 2019 for C diff colitis and sepsis. Chest CTA was performed and showed cardiomegaly and three vessels coronary calcifications (personally reviewed). The patient states that her father had CABG in his 72', she has noticed worsening DOE in the last year, she has borderline LDL but was never treated. She denies typical chest pain, but feels fatigued most of the time. Denies LE edema, orthopnea, PND. She has  palpitations at night associated with sharp chest pain, no syncope.    Past Medical History:  Diagnosis Date  . Acute hypokalemia 11/26/2014  . Arthritis 05/15/2015  . Benign essential hypertension 05/15/2015  . Migraine headache 05/15/2015  . Pancytopenia ([place])   . Vitamin D deficiency     Past Surgical History:  Procedure Laterality Date  . CESAREAN SECTION     24 years ago  . tonsils and adneoids     as a child    Current Medications: Current Meds  Medication Sig  . b complex vitamins capsule Take 1 capsule by mouth daily.  . butalbital-acetaminophen-caffeine (FIORICET, ESGIC) 50-325-40 MG tablet Take 1 tablet by mouth 3 (three) times daily as needed for headache.   . Cholecalciferol (VITAMIN D3) 2000 units TABS Take 1 tablet by mouth daily.  Marland Kitchen MAGNESIUM PO Take by mouth daily.  . Multiple Vitamin (MULTIVITAMIN) tablet Take 1 tablet by mouth daily.  . potassium chloride (MICRO-K) 10 MEQ CR capsule Take 20 mEq by mouth 2 (two) times daily.     Allergies:   Sulfa antibiotics   Social History   Socioeconomic History  . Marital status: Married    Spouse name:  None  . Number of children: None  . Years of education: None  . Highest education level: None  Social Needs  . Financial resource strain: None  . Food insecurity - worry: None  . Food insecurity - inability: None  . Transportation needs - medical: None  . Transportation needs - non-medical: None  Occupational History  . None  Tobacco Use  . Smoking status: Former Smoker    Years: 2.00    Start date: 06/25/1968    Last attempt to quit: 05/16/2014    Years since quitting: 2.8  . Smokeless tobacco: Never Used  Substance and Sexual Activity  . Alcohol use: Yes    Alcohol/week: 0.6 oz    Types: 1 Glasses of wine per week    Comment: occ  . Drug use: No  . Sexual activity: None  Other Topics Concern  . None  Social History Narrative   Lives with husband  In home.  Has seasonal allergies.  Drinks coffee, 1 cup AM and 3 plus sweet tea.  Has 3 grown kids.      Family History: The patient's family history includes Atrial fibrillation in her mother; Heart attack in her maternal grandfather; Heart disease in her paternal grandfather; Pernicious anemia in her maternal grandfather; Supraventricular tachycardia in her father.  ROS:   Please see the history of present illness.    All other systems reviewed and are negative.  EKGs/Labs/Other Studies Reviewed:    The following studies were reviewed today:  EKG:  EKG is not ordered today.  The ekg from 02/03/2017 shows NSR, nonspecific ST T wave abnormalities  Recent Labs: 02/03/2017: ALT 25 02/04/2017: Magnesium 1.9 02/06/2017: BUN 10; Creatinine, Ser 0.90; Hemoglobin 9.7; Platelets 74; Potassium 3.5; Sodium 139  Recent Lipid Panel No results found for: CHOL, TRIG, HDL, CHOLHDL, VLDL, LDLCALC, LDLDIRECT  Physical Exam:    VS:  BP 130/80 (BP Location: Right Arm, Patient Position: Sitting, Cuff Size: Normal)   Pulse 72   Ht 5' 8"  (1.727 m)   Wt 146 lb (66.2 kg)   SpO2 98%   BMI 22.20 kg/m     Wt Readings from Last 3 Encounters:    03/08/17 146 lb (66.2 kg)  02/03/17 154 lb 1.6 oz (69.9 kg)  04/20/16 154 lb 9.6 oz (70.1 kg)     GEN: Well nourished, well developed in no acute distress HEENT: Normal NECK: No JVD; No carotid bruits LYMPHATICS: No lymphadenopathy CARDIAC: RRR, 2/6 systolic murmurs, rubs, gallops RESPIRATORY:  Clear to auscultation without rales, wheezing or rhonchi  ABDOMEN: Soft, non-tender, non-distended MUSCULOSKELETAL:  No edema; No deformity  SKIN: Warm and dry NEUROLOGIC:  Alert and oriented x 3 PSYCHIATRIC:  Normal affect     ASSESSMENT:    1. DOE (dyspnea on exertion)   2. Coronary artery calcification seen on CT scan   3. Fatigue, unspecified type   4. Palpitations   5. Cardiomegaly    PLAN:    In order of problems listed above:  1. DOE, 3 vessel calcifications - we will obtain calcium score and coronary CTA to evaluate for obstructive CAD and risk stratify for lipid therapy 2. Obtain echocardiogram to evaluate LVEF, chamber size, valvular abnormalities. 3. HLP - statin dose based on CT results   Medication Adjustments/Labs and Tests Ordered: Current medicines are reviewed at length with the patient today.  Concerns regarding medicines are outlined above.  Orders Placed This Encounter  Procedures  . CT CORONARY MORPH W/CTA COR W/SCORE W/CA W/CM &/OR WO/CM  . CT CORONARY FRACTIONAL FLOW RESERVE DATA PREP  . CT CORONARY FRACTIONAL FLOW RESERVE FLUID ANALYSIS  . ECHOCARDIOGRAM COMPLETE   Meds ordered this encounter  Medications  . DISCONTD: metoprolol tartrate (LOPRESSOR) 50 MG tablet    Sig: Take 1 tablet (50 mg total) by mouth once for 1 dose. Take 1 hour prior to your coronary CT.    Dispense:  1 tablet    Refill:  0  . metoprolol tartrate (LOPRESSOR) 50 MG tablet    Sig: Take 1 tablet (50 mg total) by mouth once for 1 dose. Take 1 hour prior to your coronary CT.    Dispense:  1 tablet    Refill:  0    ERROR IN ORDERING PROVIDER. SHOULD BE Ena Dawley MD     Signed, Ena Dawley, MD  03/09/2017 7:46 AM    Dana Watkins

## 2017-03-08 NOTE — Patient Instructions (Signed)
Medication Instructions:   Your physician recommends that you continue on your current medications as directed. Please refer to the Current Medication list given to you today.    Testing/Procedures:  Your physician has requested that you have an echocardiogram. Echocardiography is a painless test that uses sound waves to create images of your heart. It provides your doctor with information about the size and shape of your heart and how well your heart's chambers and valves are working. This procedure takes approximately one hour. There are no restrictions for this procedure.    CORONARY CT WITH FFR Please arrive at the [facility] main entrance of [facility] at xx:xx AM (30-45 minutes prior to test start time)  [facility] [address] [place], [place] [address] [phone]  Proceed to the [facility] Radiology Department (First Floor).  Please follow these instructions carefully (unless otherwise directed):   On the Night Before the Test: . Drink plenty of water. . Do not consume any caffeinated/decaffeinated beverages or chocolate 12 hours prior to your test. . Do not take any antihistamines 12 hours prior to your test.  On the Day of the Test: . Drink plenty of water. Do not drink any water within one hour of the test. . Do not eat any food 4 hours prior to the test. . You may take your regular medications prior to the test. . IF NOT ON A BETA BLOCKER - Take 50 mg of lopressor (metoprolol) one hour before the test. .  After the Test: . Drink plenty of water. . After receiving IV contrast, you may experience a mild flushed feeling. This is normal. . On occasion, you may experience a mild rash up to 24 hours after the test. This is not dangerous. If this occurs, you can take Benadryl 25 mg and increase your fluid intake. . If you experience trouble breathing, this can be serious. If it is severe call 911 IMMEDIATELY. If it is mild, please call our  office. . If you take any of these medications: Glipizide/Metformin, Avandament, Glucavance,    Follow-Up:  1 MONTH WITH AN EXTENDER ON DR. Francesca Oman TEAM      If you need a refill on your cardiac medications before your next appointment, please call your pharmacy.

## 2017-03-15 ENCOUNTER — Other Ambulatory Visit: Payer: Self-pay

## 2017-03-15 ENCOUNTER — Ambulatory Visit (HOSPITAL_COMMUNITY): Payer: BLUE CROSS/BLUE SHIELD | Attending: Cardiovascular Disease

## 2017-03-15 DIAGNOSIS — I119 Hypertensive heart disease without heart failure: Secondary | ICD-10-CM | POA: Diagnosis not present

## 2017-03-15 DIAGNOSIS — R002 Palpitations: Secondary | ICD-10-CM | POA: Diagnosis not present

## 2017-03-15 DIAGNOSIS — I517 Cardiomegaly: Secondary | ICD-10-CM

## 2017-03-15 DIAGNOSIS — R0600 Dyspnea, unspecified: Secondary | ICD-10-CM

## 2017-03-15 DIAGNOSIS — R5383 Other fatigue: Secondary | ICD-10-CM | POA: Diagnosis not present

## 2017-03-15 DIAGNOSIS — I0989 Other specified rheumatic heart diseases: Secondary | ICD-10-CM | POA: Diagnosis not present

## 2017-03-15 DIAGNOSIS — Z8249 Family history of ischemic heart disease and other diseases of the circulatory system: Secondary | ICD-10-CM | POA: Insufficient documentation

## 2017-03-15 DIAGNOSIS — R0609 Other forms of dyspnea: Secondary | ICD-10-CM

## 2017-03-15 DIAGNOSIS — I083 Combined rheumatic disorders of mitral, aortic and tricuspid valves: Secondary | ICD-10-CM | POA: Insufficient documentation

## 2017-03-15 DIAGNOSIS — I2510 Atherosclerotic heart disease of native coronary artery without angina pectoris: Secondary | ICD-10-CM | POA: Diagnosis not present

## 2017-03-21 ENCOUNTER — Ambulatory Visit: Payer: BLUE CROSS/BLUE SHIELD | Admitting: Cardiovascular Disease

## 2017-04-10 ENCOUNTER — Encounter: Payer: Self-pay | Admitting: Physician Assistant

## 2017-04-10 NOTE — Progress Notes (Signed)
Cardiology Office Note    Date:  04/11/2017  ID:  Dana, Watkins 1955/02/19, MRN [id number] PCP:  Lawerance Cruel, MD  Cardiologist:  Dr. Meda Coffee   Chief Complaint: f/u abnormal CT  History of Present Illness:  Dana Watkins is a 62 y.o. female with history of HTN, chronic thrombocytopenia (ITP - remotely saw hematology), migraines, vit D deficiency, arthritis, cardiomegaly and coronary calcification who presents for f/u testing. She was admitted 01/2017 with sepsis, AKI, C diff. During that admission, chest CTA showed incidental finding of cardiomegaly and 3v coronary calcium. Father had bypass in his 70s. She had recently noted worsening DOE as well as one episode of 2-3 hours of nocturnal palpitations. This occurred in the setting of emotional stress with a recent separation. 2D echo 03/15/17 showed mildly dilated LV, EF 18-56%, normal diastolic function, + posterior lateral hypokinesis, restricted posterior MV leaflet motion, mild MR, severely dilated LA. Most recent labs 01/2017 showed K 3.5 (severely low at 2.6 on admission), Cr 0.90, Hgb 9.7 (low in the setting of acute illness), Cr 1.46 on arrival, alk phos/AST/TBili elevated, plt 74 (chronically low). Cardiac CT was ordered but not yet performed. She was urged to schedule this but wanted to f/u today's visit to discuss.   She has felt well recently without recurrent dyspnea or palpitations. She's not had any chest pain. She continues to feel intermittent emotional stress related to her separation. She wonders if this is raising her blood pressure as it is running elevated today. Recheck by me was 148/80. She states this is unusual, as it usually runs 120s/80s as home.  BP in 02/2017 was 130/80.   Past Medical History:  Diagnosis Date  . Acute hypokalemia 11/26/2014  . Arthritis 05/15/2015  . Benign essential hypertension 05/15/2015  . C. difficile colitis   . Coronary artery calcification seen on CT scan   . Migraine headache  05/15/2015  . Mild mitral regurgitation   . Pancytopenia ([place])   . Sepsis ([place]) 01/2017  . Vitamin D deficiency     Past Surgical History:  Procedure Laterality Date  . CESAREAN SECTION     24 years ago  . tonsils and adneoids     as a child    Current Medications: Current Meds  Medication Sig  . b complex vitamins capsule Take 1 capsule by mouth daily.  . butalbital-acetaminophen-caffeine (FIORICET, ESGIC) 50-325-40 MG tablet Take 1 tablet by mouth 3 (three) times daily as needed for headache.   . Cholecalciferol (VITAMIN D3) 2000 units TABS Take 1 tablet by mouth daily.  Marland Kitchen MAGNESIUM PO Take 2,500 mg by mouth daily.   . Multiple Vitamin (MULTIVITAMIN) tablet Take 1 tablet by mouth daily.  . potassium chloride (MICRO-K) 10 MEQ CR capsule Take 20 mEq by mouth 2 (two) times daily.     Allergies:   Sulfa antibiotics   Social History   Socioeconomic History  . Marital status: Married    Spouse name: Not on file  . Number of children: Not on file  . Years of education: Not on file  . Highest education level: Not on file  Occupational History  . Not on file  Social Needs  . Financial resource strain: Not on file  . Food insecurity:    Worry: Not on file    Inability: Not on file  . Transportation needs:    Medical: Not on file    Non-medical: Not on file  Tobacco Use  . Smoking status:  Former Smoker    Years: 2.00    Start date: 06/25/1968    Last attempt to quit: 05/16/2014    Years since quitting: 2.9  . Smokeless tobacco: Never Used  Substance and Sexual Activity  . Alcohol use: Yes    Alcohol/week: 0.6 oz    Types: 1 Glasses of wine per week    Comment: occ  . Drug use: No  . Sexual activity: Not on file  Lifestyle  . Physical activity:    Days per week: Not on file    Minutes per session: Not on file  . Stress: Not on file  Relationships  . Social connections:    Talks on phone: Not on file    Gets together: Not on file    Attends religious service: Not  on file    Active member of club or organization: Not on file    Attends meetings of clubs or organizations: Not on file    Relationship status: Not on file  Other Topics Concern  . Not on file  Social History Narrative   Lives with husband  In home.  Has seasonal allergies.  Drinks coffee, 1 cup AM and 3 plus sweet tea.  Has 3 grown kids.       Family History:  Family History  Problem Relation Age of Onset  . Pernicious anemia Maternal Grandfather   . Heart attack Maternal Grandfather   . Atrial fibrillation Mother   . Supraventricular tachycardia Father   . Heart disease Paternal Grandfather     ROS:   Please see the history of present illness. Otherwise, review of systems is positive for spontaneous ecchymosis on R forearm while driving recently (quarter sized).  All other systems are reviewed and otherwise negative.    PHYSICAL EXAM:   VS:  BP (!) 142/82   Pulse 66   Wt 146 lb (66.2 kg)   SpO2 99%   BMI 22.20 kg/m   BMI: Body mass index is 22.2 kg/m. GEN: Well nourished, well developed thin WF in no acute distress  HEENT: normocephalic, atraumatic Neck: no JVD, carotid bruits, or masses Cardiac: RRR; no murmurs, rubs, or gallops, no edema  Respiratory:  clear to auscultation bilaterally, normal work of breathing GI: soft, nontender, nondistended, + BS MS: no deformity or atrophy  Skin: warm and dry, no rash Neuro:  Alert and Oriented x 3, Strength and sensation are intact, follows commands Psych: euthymic mood, full affect  Wt Readings from Last 3 Encounters:  04/11/17 146 lb (66.2 kg)  03/08/17 146 lb (66.2 kg)  02/03/17 154 lb 1.6 oz (69.9 kg)      Studies/Labs Reviewed:   EKG:  EKG was ordered today and personally reviewed by me and demonstrates NSR 66bpm nonspecific ST-T changes   Recent Labs: 02/03/2017: ALT 25 02/04/2017: Magnesium 1.9 02/06/2017: BUN 10; Creatinine, Ser 0.90; Hemoglobin 9.7; Platelets 74; Potassium 3.5; Sodium 139   Lipid Panel No  results found for: CHOL, TRIG, HDL, CHOLHDL, VLDL, LDLCALC, LDLDIRECT  Additional studies/ records that were reviewed today include: Summarized above.    ASSESSMENT & PLAN:   1. Exertional dyspnea/palpitations - resolved. Discussed importance of surveillance for recurrence of symptoms.  2. Coronary calcification - I agree with recommendation to proceed with cardiac CT. Her recent chest CT showed coronary atherosclerosis and 2D echo was suggestive of posterior lateral hypokinesis. She is agreeable to proceeding. She already has the pre-administration dose of Lopressor at home. Will check CMET today and have her  return when fasting for lipid profile to help guide decision for statin. 3. Mild MR - anticipate follow-up echo in 3-5 years or at discretion of primary cardiologist. 4. Essential HTN - elevated in clinic today which patient states is unusual. She is not on any standing regimen. Will check lytes and TSH today. If potassium is low on random BMET, would check a renin/adolsterone level prior to initiation of antihypertensive so we can evaluate for hyperaldosterone. Advised pt to monitor blood pressure daily for 1 week at home, and call with readings for our review. I asked her to call sooner if seeing regular readings of greater than 130 on the top or 80 on the bottom. 5. Hypokalemia - recheck today along with Mg. 6. ITP - she reports a random ecchymosis that popped up on her R arm. Will repeat CBC today.  Disposition: F/u with Dr. Rudi Coco team APP in 6 weeks.   Medication Adjustments/Labs and Tests Ordered: Current medicines are reviewed at length with the patient today.  Concerns regarding medicines are outlined above. Medication changes, Labs and Tests ordered today are summarized above and listed in the Patient Instructions accessible in Encounters.   Signed, Charlie Pitter, PA-C  04/11/2017 2:10 PM    [facility] [place], [place], [place]   [address] Phone: [phone]; Fax: [phone]

## 2017-04-11 ENCOUNTER — Ambulatory Visit: Payer: BLUE CROSS/BLUE SHIELD | Admitting: Physician Assistant

## 2017-04-11 ENCOUNTER — Encounter: Payer: Self-pay | Admitting: Physician Assistant

## 2017-04-11 VITALS — BP 148/80 | HR 66 | Wt 146.0 lb

## 2017-04-11 DIAGNOSIS — Z862 Personal history of diseases of the blood and blood-forming organs and certain disorders involving the immune mechanism: Secondary | ICD-10-CM

## 2017-04-11 DIAGNOSIS — R0609 Other forms of dyspnea: Secondary | ICD-10-CM | POA: Diagnosis not present

## 2017-04-11 DIAGNOSIS — I340 Nonrheumatic mitral (valve) insufficiency: Secondary | ICD-10-CM | POA: Diagnosis not present

## 2017-04-11 DIAGNOSIS — E876 Hypokalemia: Secondary | ICD-10-CM

## 2017-04-11 DIAGNOSIS — I10 Essential (primary) hypertension: Secondary | ICD-10-CM

## 2017-04-11 DIAGNOSIS — I2510 Atherosclerotic heart disease of native coronary artery without angina pectoris: Secondary | ICD-10-CM | POA: Diagnosis not present

## 2017-04-11 DIAGNOSIS — R002 Palpitations: Secondary | ICD-10-CM

## 2017-04-11 DIAGNOSIS — R0600 Dyspnea, unspecified: Secondary | ICD-10-CM

## 2017-04-11 NOTE — Patient Instructions (Addendum)
Medication Instructions:  Your physician recommends that you continue on your current medications as directed. Please refer to the Current Medication list given to you today.   Labwork: Your physician recommends that you return for lab work this week for fasting lipids (whatever day this week is best for you)  Labs today:  Cmet, cbc, magnesium, tsh  Testing/Procedures: Please schedule coronary CT  Follow-Up: Your physician recommends that you schedule a follow-up appointment in: 6 weeks with Dr. Meda Coffee or an APP from Dr. Francesca Oman care team.   Any Other Special Instructions Will Be Listed Below (If Applicable)  Monitor BP once a day over the next week. Call in with readings.  Call earlier if noticing top number (systolic blood pressure) is over 130 regularly    CORONARY CT WITH FFR Please arrive at the [facility] main entrance of [facility] at _______AM (30-45 minutes prior to test start time)  [facility] [place], [place] [address] [phone]  Proceed to the [facility] Radiology Department (First Floor).  Please follow these instructions carefully (unless otherwise directed):   On the Night Before the Test:  Drink plenty of water.  Do not consume any caffeinated/decaffeinated beverages or chocolate 12 hours prior to your test.  Do not take any antihistamines 12 hours prior to your test.  On the Day of the Test:  Drink plenty of water. Do not drink any water within one hour of the test.  Do not eat any food 4 hours prior to the test.  You may take your regular medications prior to the test.  IF NOT ON A BETA BLOCKER - Take 50 mg of lopressor (metoprolol) one hour before the test. .  After the Test:  Drink plenty of water.  After receiving IV contrast, you may experience a mild flushed feeling. This is normal.  On occasion, you may experience a mild rash up to 24 hours after the test. This is not dangerous. If  this occurs, you can take Benadryl 25 mg and increase your fluid intake.  If you experience trouble breathing, this can be serious. If it is severe call 911 IMMEDIATELY. If it is mild, please call our office.  If you take any of these medications: Glipizide/Metformin, Avandament, Glucavance,    If you need a refill on your cardiac medications before your next appointment, please call your pharmacy.

## 2017-04-12 ENCOUNTER — Other Ambulatory Visit: Payer: Self-pay

## 2017-04-12 DIAGNOSIS — E876 Hypokalemia: Secondary | ICD-10-CM

## 2017-04-12 LAB — CBC
HEMATOCRIT: 34.9 % (ref 34.0–46.6)
Hemoglobin: 11.5 g/dL (ref 11.1–15.9)
MCH: 31.8 pg (ref 26.6–33.0)
MCHC: 33 g/dL (ref 31.5–35.7)
MCV: 96 fL (ref 79–97)
Platelets: 106 10*3/uL — ABNORMAL LOW (ref 150–379)
RBC: 3.62 x10E6/uL — ABNORMAL LOW (ref 3.77–5.28)
RDW: 15.2 % (ref 12.3–15.4)
WBC: 4.9 10*3/uL (ref 3.4–10.8)

## 2017-04-12 LAB — MAGNESIUM: MAGNESIUM: 2.4 mg/dL — AB (ref 1.6–2.3)

## 2017-04-12 LAB — COMPREHENSIVE METABOLIC PANEL
A/G RATIO: 1.2 (ref 1.2–2.2)
ALBUMIN: 3.7 g/dL (ref 3.6–4.8)
ALT: 26 IU/L (ref 0–32)
AST: 62 IU/L — ABNORMAL HIGH (ref 0–40)
Alkaline Phosphatase: 302 IU/L — ABNORMAL HIGH (ref 39–117)
BILIRUBIN TOTAL: 1.4 mg/dL — AB (ref 0.0–1.2)
BUN / CREAT RATIO: 13 (ref 12–28)
BUN: 16 mg/dL (ref 8–27)
CHLORIDE: 112 mmol/L — AB (ref 96–106)
CO2: 18 mmol/L — ABNORMAL LOW (ref 20–29)
Calcium: 10.9 mg/dL — ABNORMAL HIGH (ref 8.7–10.3)
Creatinine, Ser: 1.21 mg/dL — ABNORMAL HIGH (ref 0.57–1.00)
GFR calc non Af Amer: 48 mL/min/{1.73_m2} — ABNORMAL LOW (ref >59)
GFR, EST AFRICAN AMERICAN: 56 mL/min/{1.73_m2} — AB (ref >59)
GLOBULIN, TOTAL: 3.1 g/dL (ref 1.5–4.5)
Glucose: 106 mg/dL — ABNORMAL HIGH (ref 65–99)
Potassium: 3.1 mmol/L — ABNORMAL LOW (ref 3.5–5.2)
SODIUM: 144 mmol/L (ref 134–144)
Total Protein: 6.8 g/dL (ref 6.0–8.5)

## 2017-04-12 LAB — TSH: TSH: 1.22 u[IU]/mL (ref 0.450–4.500)

## 2017-04-20 ENCOUNTER — Other Ambulatory Visit: Payer: Self-pay | Admitting: Physician Assistant

## 2017-04-20 ENCOUNTER — Other Ambulatory Visit: Payer: BLUE CROSS/BLUE SHIELD | Admitting: *Deleted

## 2017-04-20 DIAGNOSIS — R002 Palpitations: Secondary | ICD-10-CM

## 2017-04-20 DIAGNOSIS — I2510 Atherosclerotic heart disease of native coronary artery without angina pectoris: Secondary | ICD-10-CM

## 2017-04-20 DIAGNOSIS — R0609 Other forms of dyspnea: Principal | ICD-10-CM

## 2017-04-20 DIAGNOSIS — I10 Essential (primary) hypertension: Secondary | ICD-10-CM

## 2017-04-20 DIAGNOSIS — R0600 Dyspnea, unspecified: Secondary | ICD-10-CM

## 2017-04-20 DIAGNOSIS — I340 Nonrheumatic mitral (valve) insufficiency: Secondary | ICD-10-CM

## 2017-04-21 LAB — LIPID PANEL
CHOL/HDL RATIO: 2.1 ratio (ref 0.0–4.4)
Cholesterol, Total: 189 mg/dL (ref 100–199)
HDL: 89 mg/dL (ref >39)
LDL Calculated: 90 mg/dL (ref 0–99)
Triglycerides: 52 mg/dL (ref 0–149)
VLDL Cholesterol Cal: 10 mg/dL (ref 5–40)

## 2017-04-28 ENCOUNTER — Ambulatory Visit (HOSPITAL_COMMUNITY): Payer: BLUE CROSS/BLUE SHIELD

## 2017-04-28 ENCOUNTER — Ambulatory Visit (HOSPITAL_COMMUNITY)
Admission: RE | Admit: 2017-04-28 | Discharge: 2017-04-28 | Disposition: A | Discharge status: Home / Self Care | Payer: BLUE CROSS/BLUE SHIELD | Source: Ambulatory Visit | Attending: Cardiovascular Disease | Admitting: Cardiovascular Disease

## 2017-04-28 DIAGNOSIS — I517 Cardiomegaly: Secondary | ICD-10-CM | POA: Insufficient documentation

## 2017-04-28 DIAGNOSIS — I2510 Atherosclerotic heart disease of native coronary artery without angina pectoris: Secondary | ICD-10-CM | POA: Diagnosis not present

## 2017-04-28 DIAGNOSIS — R002 Palpitations: Secondary | ICD-10-CM | POA: Diagnosis not present

## 2017-04-28 DIAGNOSIS — R0609 Other forms of dyspnea: Secondary | ICD-10-CM | POA: Insufficient documentation

## 2017-04-28 DIAGNOSIS — R0600 Dyspnea, unspecified: Secondary | ICD-10-CM

## 2017-04-28 DIAGNOSIS — R5383 Other fatigue: Secondary | ICD-10-CM

## 2017-04-28 MED ORDER — IOPAMIDOL (ISOVUE-370) INJECTION 76%
100.0000 mL | Freq: Once | INTRAVENOUS | Status: AC | PRN
  Administered 2017-04-28: 80 mL via INTRAVENOUS

## 2017-04-28 MED ORDER — IOPAMIDOL (ISOVUE-370) INJECTION 76%
INTRAVENOUS | Status: AC
  Filled 2017-04-28: qty 100

## 2017-04-28 MED ORDER — NITROGLYCERIN 0.4 MG SL SUBL
0.8000 mg | SUBLINGUAL_TABLET | Freq: Once | SUBLINGUAL | Status: AC
  Administered 2017-04-28: 0.8 mg via SUBLINGUAL
  Filled 2017-04-28: qty 25

## 2017-04-28 MED ORDER — NITROGLYCERIN 0.4 MG SL SUBL
SUBLINGUAL_TABLET | SUBLINGUAL | Status: AC
  Filled 2017-04-28: qty 2

## 2017-05-03 ENCOUNTER — Telehealth: Payer: Self-pay

## 2017-05-03 NOTE — Telephone Encounter (Signed)
Called to review results and to offer appointment with Truitt Merle today at 1330.   Left message to call back.

## 2017-05-03 NOTE — Telephone Encounter (Signed)
-----   Message from [name], MD sent at 04/28/2017  9:46 PM EDT ----- This patient had an abnormal coronary CTA and needs a left cardiac cath soon, could you arrange for it? Thank you, Houston Siren

## 2017-05-03 NOTE — Telephone Encounter (Signed)
Informed patient of results and verbal understanding expressed.  Scheduled patient with Dr. Irish Lack on Monday, 4/29 to set up for cath.  She understands to proceed to ER if she has CP, SOB, N/V prior to that time. She was grateful for assistance.

## 2017-05-06 ENCOUNTER — Encounter: Payer: Self-pay | Admitting: Physician Assistant

## 2017-05-09 ENCOUNTER — Ambulatory Visit: Payer: BLUE CROSS/BLUE SHIELD | Admitting: Interventional Cardiology

## 2017-05-09 ENCOUNTER — Encounter: Payer: Self-pay | Admitting: Interventional Cardiology

## 2017-05-09 VITALS — BP 144/82 | HR 81 | Ht 68.0 in | Wt 142.0 lb

## 2017-05-09 DIAGNOSIS — R0609 Other forms of dyspnea: Secondary | ICD-10-CM | POA: Diagnosis not present

## 2017-05-09 DIAGNOSIS — I2510 Atherosclerotic heart disease of native coronary artery without angina pectoris: Secondary | ICD-10-CM | POA: Diagnosis not present

## 2017-05-09 DIAGNOSIS — Z8249 Family history of ischemic heart disease and other diseases of the circulatory system: Secondary | ICD-10-CM | POA: Diagnosis not present

## 2017-05-09 DIAGNOSIS — R0600 Dyspnea, unspecified: Secondary | ICD-10-CM | POA: Insufficient documentation

## 2017-05-09 DIAGNOSIS — D696 Thrombocytopenia, unspecified: Secondary | ICD-10-CM | POA: Diagnosis not present

## 2017-05-09 NOTE — H&P (View-Only) (Signed)
Cardiology Office Note   Date:  05/09/2017   ID:  Dana, Watkins 01/09/1956, MRN [id number]  PCP:  Lawerance Cruel, MD    No chief complaint on file.  CAD  Wt Readings from Last 3 Encounters:  04/11/17 146 lb (66.2 kg)  03/08/17 146 lb (66.2 kg)  02/03/17 154 lb 1.6 oz (69.9 kg)       History of Present Illness: Dana Watkins is a 62 y.o. female  with history of HTN, chronic thrombocytopenia (ITP - remotely saw hematology), migraines, vit D deficiency, arthritis, cardiomegaly and coronary calcification who presents for f/u testing. She was admitted 01/2017 with sepsis, AKI, C diff. During that admission, chest CTA showed incidental finding of cardiomegaly and 3v coronary calcium. Father had bypass in his 32s. She had recently noted worsening DOE as well as one episode of 2-3 hours of nocturnal palpitations. This occurred in the setting of emotional stress with a recent separation. 2D echo 03/15/17 showed mildly dilated LV, EF 61-60%, normal diastolic function, + posterior lateral hypokinesis, restricted posterior MV leaflet motion, mild MR, severely dilated LA. Most recent labs 01/2017 showed K 3.5 (severely low at 2.6 on admission), Cr 0.90, Hgb 9.7 (low in the setting of acute illness), Cr 1.46 on arrival, alk phos/AST/TBili elevated, plt 74 (chronically low). Cardiac CT was ordered.   Severe LAD and RCA disease noted by CT scan. EF 55-60%.  She is sent here to discuss cardiac cath.   Denies : Chest pain. Dizziness.  Nitroglycerin use. Orthopnea. Palpitations. Paroxysmal nocturnal dyspnea.  Syncope.   She was hospitalized in Jan with C. Diff, and has had DOE since that time.  SHe is not exercising.    Past Medical History:  Diagnosis Date  . Acute hypokalemia 11/26/2014  . Arthritis 05/15/2015  . Benign essential hypertension 05/15/2015  . C. difficile colitis   . Coronary artery calcification seen on CT scan   . Migraine headache 05/15/2015  . Mild mitral  regurgitation   . Pancytopenia ([place])   . Sepsis ([place]) 01/2017  . Vitamin D deficiency     Past Surgical History:  Procedure Laterality Date  . CESAREAN SECTION     24 years ago  . tonsils and adneoids     as a child     Current Outpatient Medications  Medication Sig Dispense Refill  . b complex vitamins capsule Take 1 capsule by mouth daily.    . butalbital-acetaminophen-caffeine (FIORICET, ESGIC) 50-325-40 MG tablet Take 1 tablet by mouth 3 (three) times daily as needed for headache.     . Cholecalciferol (VITAMIN D3) 2000 units TABS Take 1 tablet by mouth daily.    Marland Kitchen MAGNESIUM PO Take 2,500 mg by mouth daily.     . metoprolol tartrate (LOPRESSOR) 50 MG tablet Take 1 tablet (50 mg total) by mouth once for 1 dose. Take 1 hour prior to your coronary CT. 1 tablet 0  . Multiple Vitamin (MULTIVITAMIN) tablet Take 1 tablet by mouth daily.    . potassium chloride (MICRO-K) 10 MEQ CR capsule Take 20 mEq by mouth 2 (two) times daily.  1   No current facility-administered medications for this visit.     Allergies:   Sulfa antibiotics    Social History:  The patient  reports that she quit smoking about 2 years ago. She started smoking about 48 years ago. She quit after 2.00 years of use. She has never used smokeless tobacco. She reports that she drinks about  0.6 oz of alcohol per week. She reports that she does not use drugs.   Family History:  The patient's family history includes Atrial fibrillation in her mother; Heart attack in her maternal grandfather; Heart disease in her paternal grandfather; Pernicious anemia in her maternal grandfather; Supraventricular tachycardia in her father.    ROS:  Please see the history of present illness.   Otherwise, review of systems are positive for fatigue.   All other systems are reviewed and negative.    PHYSICAL EXAM: VS:  There were no vitals taken for this visit. , BMI There is no height or weight on file to calculate BMI. GEN: Well  nourished, well developed, in no acute distress  HEENT: normal  Neck: no JVD, carotid bruits, or masses Cardiac: RRR; no murmurs, rubs, or gallops,no edema  Respiratory:  clear to auscultation bilaterally, normal work of breathing GI: soft, nontender, nondistended, + BS MS: no deformity or atrophy  Skin: warm and dry, no rash Neuro:  Strength and sensation are intact Psych: euthymic mood, full affect   EKG:   The ekg ordered today demonstrates NSR, nonspecific ST changes diffusely   Recent Labs: 04/11/2017: ALT 26; BUN 16; Creatinine, Ser 1.21; Hemoglobin 11.5; Magnesium 2.4; Platelets 106; Potassium 3.1; Sodium 144; TSH 1.220   Lipid Panel    Component Value Date/Time   CHOL 189 04/20/2017 1343   TRIG 52 04/20/2017 1343   HDL 89 04/20/2017 1343   CHOLHDL 2.1 04/20/2017 1343   LDLCALC 90 04/20/2017 1343     Other studies Reviewed: Additional studies/ records that were reviewed today with results demonstrating: CT scan reviewed.   ASSESSMENT AND PLAN:  1. CAD: noted by coronary CT scan.  Plan for cath given multivessel distribution.  WOuld plan Synergy stent given chronic thrombocytopenia.  Will try to obtain CT FFR of LAD prior to cath.  2. DOE: May be deconditioning from her recent illness.  She does plan to get back into more regular exercise. 3. Family history of coronary artery disease: Father with bypass surgery.  Mother with atrial fibrillation.   Current medicines are reviewed at length with the patient today.  The patient concerns regarding her medicines were addressed.  The following changes have been made:  No change  Labs/ tests ordered today include:  No orders of the defined types were placed in this encounter.   Recommend 150 minutes/week of aerobic exercise Low fat, low carb, high fiber diet recommended  Disposition:   FU in 1 year   Signed, [name], MD  05/09/2017 11:53 AM    [facility] [place],  [place], [place]  [address] Phone: [phone]; Fax: [phone]

## 2017-05-09 NOTE — Patient Instructions (Addendum)
Medication Instructions:  Your physician recommends that you continue on your current medications as directed. Please refer to the Current Medication list given to you today.   Labwork: TODAY: CBC, BMET, PT/INR  Testing/Procedures: Your physician has requested that you have a cardiac catheterization on May 2nd. Cardiac catheterization is used to diagnose and/or treat various heart conditions. Doctors may recommend this procedure for a number of different reasons. The most common reason is to evaluate chest pain. Chest pain can be a symptom of coronary artery disease (CAD), and cardiac catheterization can show whether plaque is narrowing or blocking your heart's arteries. This procedure is also used to evaluate the valves, as well as measure the blood flow and oxygen levels in different parts of your heart. For further information please visit [email or web address]. Please follow instruction sheet, as given.  Follow-Up: Your physician recommends that you keep your follow-up appointment with Melina Copa, PA on 05/25/17 at 1:30 PM.    Any Other Special Instructions Will Be Listed Below (If Applicable).    [facility] [address] [place] [address] Dept: [phone] Loc: [place]  05/09/2017  You are scheduled for a Cardiac Catheterization on Thursday, May 2 with Dr. [name].  1. Please arrive at the [facility] (Main Entrance A) at [facility]: [place], [place] [address] at 7:00 AM (two hours before your procedure to ensure your preparation). Free valet parking service is available.   Special note: Every effort is made to have your procedure done on time. Please understand that emergencies sometimes delay scheduled procedures.  2. Diet: Do not eat or drink anything after midnight prior to your procedure except sips of water to take  medications.  3. Labs: TODAY: CBC, BMET, PT/INR  4. Medication instructions in preparation for your procedure:  On the morning of your procedure, take a baby Aspirin 81 mg and any of your regular morning medicines.  You may use sips of water.  5. Plan for one night stay--bring personal belongings. 6. Bring a current list of your medications and current insurance cards. 7. You MUST have a responsible person to drive you home. 8. Someone MUST be with you the first 24 hours after you arrive home or your discharge will be delayed. 9. Please wear clothes that are easy to get on and off and wear slip-on shoes.  Thank you for allowing [place] to care for you!   -- [facility]   Coronary Angiogram A coronary angiogram is an X-ray procedure that is used to examine the arteries in the heart. In this procedure, a dye (contrast dye) is injected through a long, thin tube (catheter). The catheter is inserted through the groin, wrist, or arm. The dye is injected into each artery, then X-rays are taken to show if there is a blockage in the arteries of the heart. This procedure can also show if you have valve disease or a disease of the aorta, and it can be used to check the overall function of your heart muscle. You may have a coronary angiogram if:  You are having chest pain, or other symptoms of angina, and you are at risk for heart disease.  You have an abnormal electrocardiogram (ECG) or stress test.  You have chest pain and heart failure.  You are having irregular heart rhythms.  You and your health care provider determine that the  benefits of the test information outweigh the risks of the procedure.  Let your health care provider know about:  Any allergies you have, including allergies to contrast dye.  All medicines you are taking, including vitamins, herbs, eye drops, creams, and over-the-counter medicines.  Any problems you or family members have had with  anesthetic medicines.  Any blood disorders you have.  Any surgeries you have had.  History of kidney problems or kidney failure.  Any medical conditions you have.  Whether you are pregnant or may be pregnant. What are the risks? Generally, this is a safe procedure. However, problems may occur, including:  Infection.  Allergic reaction to medicines or dyes that are used.  Bleeding from the access site or other locations.  Kidney injury, especially in people with impaired kidney function.  Stroke (rare).  Heart attack (rare).  Damage to other structures or organs.  What happens before the procedure? Staying hydrated Follow instructions from your health care provider about hydration, which may include:  Up to 2 hours before the procedure - you may continue to drink clear liquids, such as water, clear fruit juice, black coffee, and plain tea.  Eating and drinking restrictions Follow instructions from your health care provider about eating and drinking, which may include:  8 hours before the procedure - stop eating heavy meals or foods such as meat, fried foods, or fatty foods.  6 hours before the procedure - stop eating light meals or foods, such as toast or cereal.  2 hours before the procedure - stop drinking clear liquids.  General instructions  Ask your health care provider about: ? Changing or stopping your regular medicines. This is especially important if you are taking diabetes medicines or blood thinners. ? Taking medicines such as ibuprofen. These medicines can thin your blood. Do not take these medicines before your procedure if your health care provider instructs you not to, though aspirin may be recommended prior to coronary angiograms.  Plan to have someone take you home from the hospital or clinic.  You may need to have blood tests or X-rays done. What happens during the procedure?  An IV tube will be inserted into one of your veins.  You will be  given one or more of the following: ? A medicine to help you relax (sedative). ? A medicine to numb the area where the catheter will be inserted into an artery (local anesthetic).  To reduce your risk of infection: ? Your health care team will wash or sanitize their hands. ? Your skin will be washed with soap. ? Hair may be removed from the area where the catheter will be inserted.  You will be connected to a continuous ECG monitor.  The catheter will be inserted into an artery. The location may be in your groin, in your wrist, or in the fold of your arm (near your elbow).  A type of X-ray (fluoroscopy) will be used to help guide the catheter to the opening of the blood vessel that is being examined.  A dye will be injected into the catheter, and X-rays will be taken. The dye will help to show where any narrowing or blockages are located in the heart arteries.  Tell your health care provider if you have any chest pain or trouble breathing during the procedure.  If blockages are found, your health care provider may perform another procedure, such as inserting a coronary stent. The procedure may vary among health care providers and hospitals. What happens after  the procedure?  After the procedure, you will need to keep the area still for a few hours, or for as long as told by your health care provider. If the procedure is done through the groin, you will be instructed to not bend and not cross your legs.  The insertion site will be checked frequently.  The pulse in your foot or wrist will be checked frequently.  You may have additional blood tests, X-rays, and a test that records the electrical activity of your heart (ECG).  Do not drive for 24 hours if you were given a sedative. Summary  A coronary angiogram is an X-ray procedure that is used to look into the arteries in the heart.  During the procedure, a dye (contrast dye) is injected through a long, thin tube (catheter). The  catheter is inserted through the groin, wrist, or arm.  Tell your health care provider about any allergies you have, including allergies to contrast dye.  After the procedure, you will need to keep the area still for a few hours, or for as long as told by your health care provider. This information is not intended to replace advice given to you by your health care provider. Make sure you discuss any questions you have with your health care provider. Document Released: 07/04/2002 Document Revised: 10/10/2015 Document Reviewed: 10/10/2015 Elsevier Interactive Patient Education  [employer].   If you need a refill on your cardiac medications before your next appointment, please call your pharmacy.

## 2017-05-09 NOTE — Progress Notes (Signed)
Cardiology Office Note   Date:  05/09/2017   ID:  Tiney, Zipper 03/28/1955, MRN [id number]  PCP:  Lawerance Cruel, MD    No chief complaint on file.  CAD  Wt Readings from Last 3 Encounters:  04/11/17 146 lb (66.2 kg)  03/08/17 146 lb (66.2 kg)  02/03/17 154 lb 1.6 oz (69.9 kg)       History of Present Illness: Dana Watkins is a 62 y.o. female  with history of HTN, chronic thrombocytopenia (ITP - remotely saw hematology), migraines, vit D deficiency, arthritis, cardiomegaly and coronary calcification who presents for f/u testing. She was admitted 01/2017 with sepsis, AKI, C diff. During that admission, chest CTA showed incidental finding of cardiomegaly and 3v coronary calcium. Father had bypass in his 47s. She had recently noted worsening DOE as well as one episode of 2-3 hours of nocturnal palpitations. This occurred in the setting of emotional stress with a recent separation. 2D echo 03/15/17 showed mildly dilated LV, EF 64-40%, normal diastolic function, + posterior lateral hypokinesis, restricted posterior MV leaflet motion, mild MR, severely dilated LA. Most recent labs 01/2017 showed K 3.5 (severely low at 2.6 on admission), Cr 0.90, Hgb 9.7 (low in the setting of acute illness), Cr 1.46 on arrival, alk phos/AST/TBili elevated, plt 74 (chronically low). Cardiac CT was ordered.   Severe LAD and RCA disease noted by CT scan. EF 55-60%.  She is sent here to discuss cardiac cath.   Denies : Chest pain. Dizziness.  Nitroglycerin use. Orthopnea. Palpitations. Paroxysmal nocturnal dyspnea.  Syncope.   She was hospitalized in Jan with C. Diff, and has had DOE since that time.  SHe is not exercising.    Past Medical History:  Diagnosis Date  . Acute hypokalemia 11/26/2014  . Arthritis 05/15/2015  . Benign essential hypertension 05/15/2015  . C. difficile colitis   . Coronary artery calcification seen on CT scan   . Migraine headache 05/15/2015  . Mild mitral  regurgitation   . Pancytopenia ([place])   . Sepsis ([place]) 01/2017  . Vitamin D deficiency     Past Surgical History:  Procedure Laterality Date  . CESAREAN SECTION     24 years ago  . tonsils and adneoids     as a child     Current Outpatient Medications  Medication Sig Dispense Refill  . b complex vitamins capsule Take 1 capsule by mouth daily.    . butalbital-acetaminophen-caffeine (FIORICET, ESGIC) 50-325-40 MG tablet Take 1 tablet by mouth 3 (three) times daily as needed for headache.     . Cholecalciferol (VITAMIN D3) 2000 units TABS Take 1 tablet by mouth daily.    Marland Kitchen MAGNESIUM PO Take 2,500 mg by mouth daily.     . metoprolol tartrate (LOPRESSOR) 50 MG tablet Take 1 tablet (50 mg total) by mouth once for 1 dose. Take 1 hour prior to your coronary CT. 1 tablet 0  . Multiple Vitamin (MULTIVITAMIN) tablet Take 1 tablet by mouth daily.    . potassium chloride (MICRO-K) 10 MEQ CR capsule Take 20 mEq by mouth 2 (two) times daily.  1   No current facility-administered medications for this visit.     Allergies:   Sulfa antibiotics    Social History:  The patient  reports that she quit smoking about 2 years ago. She started smoking about 48 years ago. She quit after 2.00 years of use. She has never used smokeless tobacco. She reports that she drinks about  0.6 oz of alcohol per week. She reports that she does not use drugs.   Family History:  The patient's family history includes Atrial fibrillation in her mother; Heart attack in her maternal grandfather; Heart disease in her paternal grandfather; Pernicious anemia in her maternal grandfather; Supraventricular tachycardia in her father.    ROS:  Please see the history of present illness.   Otherwise, review of systems are positive for fatigue.   All other systems are reviewed and negative.    PHYSICAL EXAM: VS:  There were no vitals taken for this visit. , BMI There is no height or weight on file to calculate BMI. GEN: Well  nourished, well developed, in no acute distress  HEENT: normal  Neck: no JVD, carotid bruits, or masses Cardiac: RRR; no murmurs, rubs, or gallops,no edema  Respiratory:  clear to auscultation bilaterally, normal work of breathing GI: soft, nontender, nondistended, + BS MS: no deformity or atrophy  Skin: warm and dry, no rash Neuro:  Strength and sensation are intact Psych: euthymic mood, full affect   EKG:   The ekg ordered today demonstrates NSR, nonspecific ST changes diffusely   Recent Labs: 04/11/2017: ALT 26; BUN 16; Creatinine, Ser 1.21; Hemoglobin 11.5; Magnesium 2.4; Platelets 106; Potassium 3.1; Sodium 144; TSH 1.220   Lipid Panel    Component Value Date/Time   CHOL 189 04/20/2017 1343   TRIG 52 04/20/2017 1343   HDL 89 04/20/2017 1343   CHOLHDL 2.1 04/20/2017 1343   LDLCALC 90 04/20/2017 1343     Other studies Reviewed: Additional studies/ records that were reviewed today with results demonstrating: CT scan reviewed.   ASSESSMENT AND PLAN:  1. CAD: noted by coronary CT scan.  Plan for cath given multivessel distribution.  WOuld plan Synergy stent given chronic thrombocytopenia.  Will try to obtain CT FFR of LAD prior to cath.  2. DOE: May be deconditioning from her recent illness.  She does plan to get back into more regular exercise. 3. Family history of coronary artery disease: Father with bypass surgery.  Mother with atrial fibrillation.   Current medicines are reviewed at length with the patient today.  The patient concerns regarding her medicines were addressed.  The following changes have been made:  No change  Labs/ tests ordered today include:  No orders of the defined types were placed in this encounter.   Recommend 150 minutes/week of aerobic exercise Low fat, low carb, high fiber diet recommended  Disposition:   FU in 1 year   Signed, [name], MD  05/09/2017 11:53 AM    [facility] [address],  [place], [place]  [address] Phone: [phone]; Fax: [phone]

## 2017-05-10 ENCOUNTER — Telehealth: Payer: Self-pay | Admitting: *Deleted

## 2017-05-10 LAB — PROTIME-INR
INR: 1.3 — ABNORMAL HIGH (ref 0.8–1.2)
Prothrombin Time: 13 s — ABNORMAL HIGH (ref 9.1–12.0)

## 2017-05-10 LAB — BASIC METABOLIC PANEL
BUN / CREAT RATIO: 11 — AB (ref 12–28)
BUN: 14 mg/dL (ref 8–27)
CO2: 18 mmol/L — AB (ref 20–29)
CREATININE: 1.22 mg/dL — AB (ref 0.57–1.00)
Calcium: 10.2 mg/dL (ref 8.7–10.3)
Chloride: 110 mmol/L — ABNORMAL HIGH (ref 96–106)
GFR, EST AFRICAN AMERICAN: 55 mL/min/{1.73_m2} — AB (ref >59)
GFR, EST NON AFRICAN AMERICAN: 48 mL/min/{1.73_m2} — AB (ref >59)
Glucose: 93 mg/dL (ref 65–99)
POTASSIUM: 3.4 mmol/L — AB (ref 3.5–5.2)
SODIUM: 142 mmol/L (ref 134–144)

## 2017-05-10 LAB — CBC
HEMATOCRIT: 34.8 % (ref 34.0–46.6)
Hemoglobin: 11.7 g/dL (ref 11.1–15.9)
MCH: 32.5 pg (ref 26.6–33.0)
MCHC: 33.6 g/dL (ref 31.5–35.7)
MCV: 97 fL (ref 79–97)
PLATELETS: 87 10*3/uL — AB (ref 150–379)
RBC: 3.6 x10E6/uL — ABNORMAL LOW (ref 3.77–5.28)
RDW: 15.8 % — AB (ref 12.3–15.4)
WBC: 4.6 10*3/uL (ref 3.4–10.8)

## 2017-05-10 NOTE — Telephone Encounter (Signed)
LMTCB to discuss instructions with patient

## 2017-05-10 NOTE — Telephone Encounter (Addendum)
Catheterization scheduled at [facility] for: Thursday May 2 ,2019 9 AM Verify arrival time and place: [place] Entrance A at: 7 AM  No solid food after midnight prior to cath, clear liquids until 5 AM Verify allergies in Epic Verify diabetes medications.  Verify AM meds can be  taken pre-cath with sip of water including ASA 81 mg--Okay per Dr Irish Lack.   Confirm patient has responsible person to drive home post procedure and observe patient for 24 hours:yes  I discussed instructions with patient, she verbalized understanding, thanked me for call.

## 2017-05-12 ENCOUNTER — Ambulatory Visit (HOSPITAL_COMMUNITY)
Admission: RE | Admit: 2017-05-12 | Discharge: 2017-05-12 | Disposition: A | Discharge status: Home / Self Care | Payer: BLUE CROSS/BLUE SHIELD | Source: Ambulatory Visit | Attending: Interventional Cardiology | Admitting: Interventional Cardiology

## 2017-05-12 ENCOUNTER — Ambulatory Visit (HOSPITAL_COMMUNITY)
Admission: RE | Disposition: A | Discharge status: Home / Self Care | Payer: Self-pay | Source: Ambulatory Visit | Attending: Interventional Cardiology

## 2017-05-12 DIAGNOSIS — M1990 Unspecified osteoarthritis, unspecified site: Secondary | ICD-10-CM | POA: Diagnosis not present

## 2017-05-12 DIAGNOSIS — Z8249 Family history of ischemic heart disease and other diseases of the circulatory system: Secondary | ICD-10-CM | POA: Insufficient documentation

## 2017-05-12 DIAGNOSIS — I10 Essential (primary) hypertension: Secondary | ICD-10-CM | POA: Insufficient documentation

## 2017-05-12 DIAGNOSIS — I2510 Atherosclerotic heart disease of native coronary artery without angina pectoris: Secondary | ICD-10-CM | POA: Diagnosis not present

## 2017-05-12 DIAGNOSIS — R931 Abnormal findings on diagnostic imaging of heart and coronary circulation: Secondary | ICD-10-CM | POA: Diagnosis not present

## 2017-05-12 DIAGNOSIS — D696 Thrombocytopenia, unspecified: Secondary | ICD-10-CM | POA: Diagnosis not present

## 2017-05-12 DIAGNOSIS — Z882 Allergy status to sulfonamides status: Secondary | ICD-10-CM | POA: Insufficient documentation

## 2017-05-12 DIAGNOSIS — E559 Vitamin D deficiency, unspecified: Secondary | ICD-10-CM | POA: Diagnosis not present

## 2017-05-12 DIAGNOSIS — I2584 Coronary atherosclerosis due to calcified coronary lesion: Secondary | ICD-10-CM | POA: Insufficient documentation

## 2017-05-12 DIAGNOSIS — Z87891 Personal history of nicotine dependence: Secondary | ICD-10-CM | POA: Insufficient documentation

## 2017-05-12 HISTORY — PX: LEFT HEART CATH AND CORONARY ANGIOGRAPHY: CATH118249

## 2017-05-12 HISTORY — PX: ULTRASOUND GUIDANCE FOR VASCULAR ACCESS: SHX6516

## 2017-05-12 LAB — POTASSIUM: Potassium: 2.8 mmol/L — ABNORMAL LOW (ref 3.5–5.1)

## 2017-05-12 SURGERY — LEFT HEART CATH AND CORONARY ANGIOGRAPHY
Anesthesia: LOCAL

## 2017-05-12 MED ORDER — VERAPAMIL HCL 2.5 MG/ML IV SOLN
INTRAVENOUS | Status: DC | PRN
  Administered 2017-05-12: 10 mL via INTRA_ARTERIAL

## 2017-05-12 MED ORDER — MIDAZOLAM HCL 2 MG/2ML IJ SOLN
INTRAMUSCULAR | Status: DC | PRN
  Administered 2017-05-12: 2 mg via INTRAVENOUS
  Administered 2017-05-12: 1 mg via INTRAVENOUS

## 2017-05-12 MED ORDER — SODIUM CHLORIDE 0.9 % IV SOLN: 250.0000 mL | INTRAVENOUS | Status: DC | PRN

## 2017-05-12 MED ORDER — FENTANYL CITRATE (PF) 100 MCG/2ML IJ SOLN
INTRAMUSCULAR | Status: DC | PRN
  Administered 2017-05-12 (×2): 25 ug via INTRAVENOUS

## 2017-05-12 MED ORDER — HEPARIN (PORCINE) IN NACL 1000-0.9 UT/500ML-% IV SOLN
INTRAVENOUS | Status: AC
  Filled 2017-05-12: qty 500

## 2017-05-12 MED ORDER — HEPARIN SODIUM (PORCINE) 1000 UNIT/ML IJ SOLN
INTRAMUSCULAR | Status: AC
  Filled 2017-05-12: qty 1

## 2017-05-12 MED ORDER — SODIUM CHLORIDE 0.9 % WEIGHT BASED INFUSION: 1.0000 mL/kg/h | INTRAVENOUS | Status: DC

## 2017-05-12 MED ORDER — LIDOCAINE HCL (PF) 1 % IJ SOLN
INTRAMUSCULAR | Status: AC
  Filled 2017-05-12: qty 30

## 2017-05-12 MED ORDER — LIDOCAINE HCL (PF) 1 % IJ SOLN
INTRAMUSCULAR | Status: DC | PRN
  Administered 2017-05-12: 1 mL

## 2017-05-12 MED ORDER — MIDAZOLAM HCL 2 MG/2ML IJ SOLN
INTRAMUSCULAR | Status: AC
  Filled 2017-05-12: qty 2

## 2017-05-12 MED ORDER — HEPARIN SODIUM (PORCINE) 1000 UNIT/ML IJ SOLN
INTRAMUSCULAR | Status: DC | PRN
  Administered 2017-05-12: 3000 [IU] via INTRAVENOUS

## 2017-05-12 MED ORDER — POTASSIUM CHLORIDE CRYS ER 20 MEQ PO TBCR
EXTENDED_RELEASE_TABLET | ORAL | Status: AC
  Administered 2017-05-12: 40 meq via ORAL
  Filled 2017-05-12: qty 2

## 2017-05-12 MED ORDER — ONDANSETRON HCL 4 MG/2ML IJ SOLN: 4.0000 mg | Freq: Four times a day (QID) | INTRAMUSCULAR | Status: DC | PRN

## 2017-05-12 MED ORDER — POTASSIUM CHLORIDE CRYS ER 20 MEQ PO TBCR
40.0000 meq | EXTENDED_RELEASE_TABLET | ORAL | Status: AC
  Administered 2017-05-12 (×2): 40 meq via ORAL

## 2017-05-12 MED ORDER — SODIUM CHLORIDE 0.9 % IV SOLN: INTRAVENOUS | Status: DC

## 2017-05-12 MED ORDER — ACETAMINOPHEN 325 MG PO TABS: 650.0000 mg | ORAL_TABLET | ORAL | Status: DC | PRN

## 2017-05-12 MED ORDER — ASPIRIN 81 MG PO CHEW: 81.0000 mg | CHEWABLE_TABLET | ORAL | Status: DC

## 2017-05-12 MED ORDER — VERAPAMIL HCL 2.5 MG/ML IV SOLN
INTRAVENOUS | Status: AC
  Filled 2017-05-12: qty 2

## 2017-05-12 MED ORDER — SODIUM CHLORIDE 0.9% FLUSH: 3.0000 mL | Freq: Two times a day (BID) | INTRAVENOUS | Status: DC

## 2017-05-12 MED ORDER — SODIUM CHLORIDE 0.9 % WEIGHT BASED INFUSION
3.0000 mL/kg/h | INTRAVENOUS | Status: DC
  Administered 2017-05-12: 3 mL/kg/h via INTRAVENOUS

## 2017-05-12 MED ORDER — POTASSIUM CHLORIDE CRYS ER 20 MEQ PO TBCR
EXTENDED_RELEASE_TABLET | ORAL | Status: AC
Start: 2017-05-12 — End: 2017-05-12
  Administered 2017-05-12: 40 meq via ORAL
  Filled 2017-05-12: qty 2

## 2017-05-12 MED ORDER — HEPARIN (PORCINE) IN NACL 2-0.9 UNITS/ML
INTRAMUSCULAR | Status: DC | PRN
  Administered 2017-05-12 (×2): 500 mL

## 2017-05-12 MED ORDER — FENTANYL CITRATE (PF) 100 MCG/2ML IJ SOLN
INTRAMUSCULAR | Status: AC
  Filled 2017-05-12: qty 2

## 2017-05-12 MED ORDER — SODIUM CHLORIDE 0.9% FLUSH: 3.0000 mL | INTRAVENOUS | Status: DC | PRN

## 2017-05-12 MED ORDER — IOPAMIDOL (ISOVUE-370) INJECTION 76%
INTRAVENOUS | Status: AC
  Filled 2017-05-12: qty 100

## 2017-05-12 MED ORDER — IOPAMIDOL (ISOVUE-370) INJECTION 76%
INTRAVENOUS | Status: DC | PRN
  Administered 2017-05-12: 50 mL via INTRAVENOUS

## 2017-05-12 SURGICAL SUPPLY — 12 items
CATH INFINITI 5 FR JL3.5 (CATHETERS) ×1 IMPLANT
CATH INFINITI JR4 5F (CATHETERS) ×1 IMPLANT
COVER PRB 48X5XTLSCP FOLD TPE (BAG) IMPLANT
COVER PROBE 5X48 (BAG) ×3
DEVICE RAD TR BAND REGULAR (VASCULAR PRODUCTS) ×1 IMPLANT
GLIDESHEATH SLEND SS 6F .021 (SHEATH) ×1 IMPLANT
GUIDEWIRE INQWIRE 1.5J.035X260 (WIRE) IMPLANT
INQWIRE 1.5J .035X260CM (WIRE) ×3
KIT HEART LEFT (KITS) ×3 IMPLANT
PACK CARDIAC CATHETERIZATION (CUSTOM PROCEDURE TRAY) ×3 IMPLANT
TRANSDUCER W/STOPCOCK (MISCELLANEOUS) ×3 IMPLANT
TUBING CIL FLEX 10 FLL-RA (TUBING) ×3 IMPLANT

## 2017-05-12 NOTE — Interval H&P Note (Signed)
Cath Lab Visit (complete for each Cath Lab visit)  Clinical Evaluation Leading to the Procedure:   ACS: No.  Non-ACS:    Anginal Classification: CCS II  Anti-ischemic medical therapy: Minimal Therapy (1 class of medications)  Non-Invasive Test Results: Intermediate-risk stress test findings: cardiac mortality 1-3%/year  Prior CABG: No previous CABG   Abnormal coronary CT.   History and Physical Interval Note:  05/12/2017 11:29 AM  Dana Watkins  has presented today for surgery, with the diagnosis of cad calcification  The various methods of treatment have been discussed with the patient and family. After consideration of risks, benefits and other options for treatment, the patient has consented to  Procedure(s): LEFT HEART CATH AND CORONARY ANGIOGRAPHY (N/A) as a surgical intervention .  The patient's history has been reviewed, patient examined, no change in status, stable for surgery.  I have reviewed the patient's chart and labs.  Questions were answered to the patient's satisfaction.     Dana Watkins

## 2017-05-12 NOTE — Progress Notes (Signed)
Rt radial TRB removed, gauze with tegaderm placed, site unremarkable, arm board placed.  D/c instructions discussed and reviewed with pt and mother and they verbalized understanding

## 2017-05-12 NOTE — Discharge Instructions (Signed)

## 2017-05-12 NOTE — Research (Signed)
CADFEM Informed Consent   Subject Name: Dana Watkins  Subject met inclusion and exclusion criteria.  The informed consent form, study requirements and expectations were reviewed with the subject and questions and concerns were addressed prior to the signing of the consent form.  The subject verbalized understanding of the trail requirements.  The subject agreed to participate in the CADFEM trial and signed the informed consent.  The informed consent was obtained prior to performance of any protocol-specific procedures for the subject.  A copy of the signed informed consent was given to the subject and a copy was placed in the subject's medical record.  [name] 05/12/2017, 07:40 AM

## 2017-05-13 ENCOUNTER — Encounter (HOSPITAL_COMMUNITY): Payer: Self-pay | Admitting: Interventional Cardiology

## 2017-05-16 MED FILL — Heparin Sod (Porcine)-NaCl IV Soln 1000 Unit/500ML-0.9%: INTRAVENOUS | Qty: 1000 | Status: AC

## 2017-05-24 ENCOUNTER — Encounter: Payer: Self-pay | Admitting: Physician Assistant

## 2017-05-24 NOTE — Progress Notes (Signed)
Cardiology Office Note    Date:  05/25/2017  ID:  Dana Watkins, Dana Watkins 02-04-1955, MRN [id number] PCP:  Lawerance Cruel, MD  Cardiologist:  Dr. Meda Coffee   Chief Complaint: f/u cath  History of Present Illness:  Dana Watkins is a 62 y.o. female with history of HTN, chronic thrombocytopenia (ITP - remotely saw hematology), migraines, vit D deficiency, arthritis, cardiomegaly, probable CKD III, hypokalemia, CAD (cath 04/2017 - med rx) presents for f/u cath.   She was admitted 01/2017 with sepsis, AKI, C diff, severe hypokalemia. During that admission, chest CTA showed incidental finding of cardiomegaly and 3v coronary calcium. Father had bypass in his 39s. She had recently noted worsening DOE as well as one episode of 2-3 hours of nocturnal palpitations. This occurred in the setting of emotional stress with a recent separation. 2D echo 03/15/17 showed mildly dilated LV, EF 93-26%, normal diastolic function, + posterior lateral hypokinesis, restricted posterior MV leaflet motion, mild MR, severely dilated LA. Most recent labs 01/2017 showed K 3.5 (severely low at 2.4 during admission), Cr 0.90, Hgb 9.7 (low in the setting of acute illness), Cr 1.46 on arrival, alk phos/AST/TBili elevated, plt 74 (chronically low). Cardiac CT was recommended. She initially deferred this then it was performed showing multivessel CAD, hence cath was recommended. Cardiac cath showed 05/12/17 showed: "Diffuse, calcific CAD particularly in the distal RCA and proximal to mid LAD.  LAD disease is nonobstructive.  RCA disease is more severe but does not appear significant.  Given her lack of symptoms, would pursue medical therapy. Right radial spasm noted.  If 6 Fr catheter was needed in the future, would recommend groin access." Most recent labs showed K 2.8, Hgb 11.7, plt 87, Cr 1.22, LDL 90.  She returns for follow-up feeling great. No chest pain, palpitations or dyspnea. Her PCP has referred her back to hematology for  re-evaluation of her low platelet count. She did not have any issues with her cath site. She recalls longstanding hx of hypokalemia but is not sure how this was evaluated in the past.    Past Medical History:  Diagnosis Date  . Acute hypokalemia 11/26/2014  . Arthritis 05/15/2015  . Benign essential hypertension 05/15/2015  . C. difficile colitis   . CAD (coronary artery disease)    a. cath 04/2017: ""Diffuse, calcific CAD particularly in the distal RCA and proximal to mid LAD.  LAD disease is nonobstructive.  RCA disease is more severe but does not appear significant.  Given her lack of symptoms, would pursue medical therapy."  . CKD (chronic kidney disease), stage III ([place])   . Hypokalemia   . Migraine headache 05/15/2015  . Mild mitral regurgitation   . Pancytopenia ([place])   . Sepsis ([place]) 01/2017  . Thrombocytopenia ([place])    a. chronic thrombocytopenia (ITP - remotely saw hematology).  . Vitamin D deficiency     Past Surgical History:  Procedure Laterality Date  . CESAREAN SECTION     24 years ago  . LEFT HEART CATH AND CORONARY ANGIOGRAPHY N/A 05/12/2017   Procedure: LEFT HEART CATH AND CORONARY ANGIOGRAPHY;  Surgeon: [name] Booze, MD;  Location: [facility];  Service: Cardiovascular;  Laterality: N/A;  . tonsils and adneoids     as a child  . ULTRASOUND GUIDANCE FOR VASCULAR ACCESS  05/12/2017   Procedure: Ultrasound Guidance For Vascular Access;  Surgeon: [name] Booze, MD;  Location: [facility];  Service: Cardiovascular;;    Current Medications:  Current Meds  Medication Sig  . b complex vitamins capsule Take 1 capsule by mouth daily.  . butalbital-acetaminophen-caffeine (FIORICET, ESGIC) 50-325-40 MG tablet Take 1 tablet by mouth 3 (three) times daily as needed for headache.   . Cholecalciferol (VITAMIN D3) 2000 units TABS Take 1 tablet by mouth daily.  Marland Kitchen MAGNESIUM PO Take 400 mg by mouth daily.   . Multiple Vitamin (MULTIVITAMIN) tablet Take 1  tablet by mouth daily.  . potassium chloride (MICRO-K) 10 MEQ CR capsule Take 20 mEq by mouth 2 (two) times daily.   Allergies:   Sulfa antibiotics   Social History   Socioeconomic History  . Marital status: Married    Spouse name: Not on file  . Number of children: Not on file  . Years of education: Not on file  . Highest education level: Not on file  Occupational History  . Not on file  Social Needs  . Financial resource strain: Not on file  . Food insecurity:    Worry: Not on file    Inability: Not on file  . Transportation needs:    Medical: Not on file    Non-medical: Not on file  Tobacco Use  . Smoking status: Former Smoker    Years: 2.00    Start date: 06/25/1968    Last attempt to quit: 05/16/2014    Years since quitting: 3.0  . Smokeless tobacco: Never Used  Substance and Sexual Activity  . Alcohol use: Yes    Alcohol/week: 0.6 oz    Types: 1 Glasses of wine per week    Comment: occ  . Drug use: No  . Sexual activity: Not on file  Lifestyle  . Physical activity:    Days per week: Not on file    Minutes per session: Not on file  . Stress: Not on file  Relationships  . Social connections:    Talks on phone: Not on file    Gets together: Not on file    Attends religious service: Not on file    Active member of club or organization: Not on file    Attends meetings of clubs or organizations: Not on file    Relationship status: Not on file  Other Topics Concern  . Not on file  Social History Narrative   Lives with husband  In home.  Has seasonal allergies.  Drinks coffee, 1 cup AM and 3 plus sweet tea.  Has 3 grown kids.       Family History:  Family History  Problem Relation Age of Onset  . Pernicious anemia Maternal Grandfather   . Heart attack Maternal Grandfather   . Atrial fibrillation Mother   . Supraventricular tachycardia Father   . Heart disease Paternal Grandfather     ROS:   Please see the history of present illness.  All other systems  are reviewed and otherwise negative.    PHYSICAL EXAM:   VS:  BP 140/82   Pulse 77   Ht 5' 8"  (1.727 m)   Wt 137 lb 1.9 oz (62.2 kg)   SpO2 99%   BMI 20.85 kg/m   BMI: Body mass index is 20.85 kg/m. GEN: Well nourished, well developed thin WF, in no acute distress  HEENT: normocephalic, atraumatic Neck: no JVD, carotid bruits, or masses Cardiac: RRR; no murmurs, rubs, or gallops, no edema  Respiratory:  clear to auscultation bilaterally, normal work of breathing GI: soft, nontender, nondistended, + BS MS: no deformity or atrophy  Skin: warm and  dry, no rash. Right radial cath site without hematoma or ecchymosis; good pulse. Rare senile purpura noted on UE. Neuro:  Alert and Oriented x 3, Strength and sensation are intact, follows commands Psych: euthymic mood, full affect  Wt Readings from Last 3 Encounters:  05/25/17 137 lb 1.9 oz (62.2 kg)  05/12/17 137 lb (62.1 kg)  05/09/17 142 lb (64.4 kg)      Studies/Labs Reviewed:   EKG:  EKG was not ordered today.  Recent Labs: 04/11/2017: ALT 26; Magnesium 2.4; TSH 1.220 05/09/2017: BUN 14; Creatinine, Ser 1.22; Hemoglobin 11.7; Platelets 87; Sodium 142 05/12/2017: Potassium 2.8   Lipid Panel    Component Value Date/Time   CHOL 189 04/20/2017 1343   TRIG 52 04/20/2017 1343   HDL 89 04/20/2017 1343   CHOLHDL 2.1 04/20/2017 1343   LDLCALC 90 04/20/2017 1343    Additional studies/ records that were reviewed today include: Summarized above.    ASSESSMENT & PLAN:   1. CAD - cath results as noted above. Now that dx of CAD has been established, we need to focus on risk reduction via good blood pressure and lipid control. Patient prefers to start low dose of any new meds. Will initiate amlodipine 2.41m daily for goal BP <130/80. Ideally she should be on at least moderate intensity statin but she prefers to start low dose - will try atorvastatin 152mwith recheck LFTs/lipids in 6 weeks. Will avoid initiation of aspirin for now  given her thrombocytopenia and hx of spontaneous purpura. 2. Mild MR - follow clinically. Anticipate repeat echo in 3-5 years at discretion of primary cardiologist. 3. Essential HTN - initiate amlodipine 2.16m70maily. Asked pt to monitor BP regularly and call if running >130/80. 4. Hypokalemia - pt reports long history of this. Does not appear to be medication induced. She reports eating a healthy well rounded diet. Will check renin/aldosterone level and recheck BMET with Mg. I have recommended she review further workup of potassium-wasting states or disorders with her primary care provider if not already addressed.  Disposition: F/u with Dr. NelMeda Coffee 1 year.  Medication Adjustments/Labs and Tests Ordered: Current medicines are reviewed at length with the patient today.  Concerns regarding medicines are outlined above. Medication changes, Labs and Tests ordered today are summarized above and listed in the Patient Instructions accessible in Encounters.   Signed, DayCharlie PitterA-C  05/25/2017 1:51 PM    [facility] [address]  [id number]: [phone]: [phone]

## 2017-05-25 ENCOUNTER — Ambulatory Visit: Payer: BLUE CROSS/BLUE SHIELD | Admitting: Physician Assistant

## 2017-05-25 ENCOUNTER — Encounter: Payer: Self-pay | Admitting: Physician Assistant

## 2017-05-25 VITALS — BP 140/82 | HR 77 | Ht 68.0 in | Wt 137.1 lb

## 2017-05-25 DIAGNOSIS — I2510 Atherosclerotic heart disease of native coronary artery without angina pectoris: Secondary | ICD-10-CM

## 2017-05-25 DIAGNOSIS — E876 Hypokalemia: Secondary | ICD-10-CM

## 2017-05-25 DIAGNOSIS — I10 Essential (primary) hypertension: Secondary | ICD-10-CM

## 2017-05-25 DIAGNOSIS — I340 Nonrheumatic mitral (valve) insufficiency: Secondary | ICD-10-CM | POA: Diagnosis not present

## 2017-05-25 MED ORDER — ATORVASTATIN CALCIUM 10 MG PO TABS: 10.0000 mg | ORAL_TABLET | Freq: Every day | ORAL | 6 refills | Status: DC

## 2017-05-25 MED ORDER — AMLODIPINE BESYLATE 2.5 MG PO TABS: 2.5000 mg | ORAL_TABLET | Freq: Every day | ORAL | 6 refills | Status: DC

## 2017-05-25 NOTE — Patient Instructions (Addendum)
Medication Instructions:  Your physician has recommended you make the following change in your medication: 1.  START Amlodipine 2.5 mg daily 2.  START Atorvastatin 10 mg daily   Labwork: TODAY:  BMET, MAGNESIUM, & RENIN ALDOSTERONE LEVEL  6 WEEKS:  FASTING LIPID & LFT  Testing/Procedures: None ordered  Follow-Up: Your physician wants you to follow-up in: [place] will receive a reminder letter in the mail two months in advance. If you don't receive a letter, please call our office to schedule the follow-up appointment.    Any Other Special Instructions Will Be Listed Below (If Applicable).     If you need a refill on your cardiac medications before your next appointment, please call your pharmacy.  Please increase dietary intake of healthy sources of potassium including bananas, squash, yogurt, white beans, sweet potatoes, leafy greens, and avocados.  Please monitor your blood pressure occasionally at home. Call your doctor if you tend to get readings of greater than 130 on the top number or 80 on the bottom number.

## 2017-05-30 ENCOUNTER — Telehealth: Payer: Self-pay

## 2017-05-30 NOTE — Telephone Encounter (Signed)
-----   Message from Charlie Pitter, [place] sent at 05/26/2017  7:55 AM EDT ----- Please let patient know labs are stable except potassium remains low, chloride remains elevated and CO2 remains low - please send to PCP - I had asked pt to discuss further w/u of electrolyte abnormalities with PCP. Increase KCl to 58mq BID and have pt f/u with PCP within 1 week for recheck. Will only call her if renin/aldo is abnormal (I told her it would take about a week to come back) DMelina CopaPA-C

## 2017-05-30 NOTE — Telephone Encounter (Signed)
lmtcb for lab results and recommendations

## 2017-06-02 LAB — BASIC METABOLIC PANEL
BUN/Creatinine Ratio: 12 (ref 12–28)
BUN: 15 mg/dL (ref 8–27)
CALCIUM: 10 mg/dL (ref 8.7–10.3)
CHLORIDE: 113 mmol/L — AB (ref 96–106)
CO2: 19 mmol/L — ABNORMAL LOW (ref 20–29)
CREATININE: 1.23 mg/dL — AB (ref 0.57–1.00)
GFR calc non Af Amer: 47 mL/min/{1.73_m2} — ABNORMAL LOW (ref >59)
GFR, EST AFRICAN AMERICAN: 55 mL/min/{1.73_m2} — AB (ref >59)
Glucose: 97 mg/dL (ref 65–99)
Potassium: 3.2 mmol/L — ABNORMAL LOW (ref 3.5–5.2)
SODIUM: 145 mmol/L — AB (ref 134–144)

## 2017-06-02 LAB — ALDOSTERONE + RENIN ACTIVITY W/ RATIO
ALDOS/RENIN RATIO: 9.4 (ref 0.0–30.0)
ALDOSTERONE: 15.5 ng/dL (ref 0.0–30.0)
Renin: 1.652 ng/mL/hr (ref 0.167–5.380)

## 2017-06-02 LAB — MAGNESIUM: MAGNESIUM: 2.4 mg/dL — AB (ref 1.6–2.3)

## 2017-06-16 ENCOUNTER — Other Ambulatory Visit: Payer: BLUE CROSS/BLUE SHIELD

## 2017-06-16 ENCOUNTER — Ambulatory Visit: Payer: BLUE CROSS/BLUE SHIELD | Admitting: Hematology & Oncology

## 2017-06-28 ENCOUNTER — Encounter: Payer: BLUE CROSS/BLUE SHIELD | Admitting: Hematology

## 2017-06-29 ENCOUNTER — Telehealth: Payer: Self-pay | Admitting: Hematology & Oncology

## 2017-06-29 ENCOUNTER — Inpatient Hospital Stay: Payer: BLUE CROSS/BLUE SHIELD | Attending: Hematology & Oncology

## 2017-06-29 ENCOUNTER — Inpatient Hospital Stay (HOSPITAL_BASED_OUTPATIENT_CLINIC_OR_DEPARTMENT_OTHER): Payer: BLUE CROSS/BLUE SHIELD | Admitting: Hematology & Oncology

## 2017-06-29 VITALS — BP 138/72 | HR 72 | Temp 97.8°F | Resp 17 | Wt 144.0 lb

## 2017-06-29 DIAGNOSIS — D638 Anemia in other chronic diseases classified elsewhere: Secondary | ICD-10-CM

## 2017-06-29 DIAGNOSIS — R635 Abnormal weight gain: Secondary | ICD-10-CM

## 2017-06-29 DIAGNOSIS — Z79899 Other long term (current) drug therapy: Secondary | ICD-10-CM

## 2017-06-29 DIAGNOSIS — D649 Anemia, unspecified: Secondary | ICD-10-CM | POA: Diagnosis not present

## 2017-06-29 DIAGNOSIS — Z87891 Personal history of nicotine dependence: Secondary | ICD-10-CM

## 2017-06-29 DIAGNOSIS — D696 Thrombocytopenia, unspecified: Secondary | ICD-10-CM | POA: Diagnosis not present

## 2017-06-29 DIAGNOSIS — D510 Vitamin B12 deficiency anemia due to intrinsic factor deficiency: Secondary | ICD-10-CM

## 2017-06-29 DIAGNOSIS — E032 Hypothyroidism due to medicaments and other exogenous substances: Secondary | ICD-10-CM

## 2017-06-29 LAB — CMP (CANCER CENTER ONLY)
ALBUMIN: 3.2 g/dL — AB (ref 3.5–5.0)
ALT: 23 U/L (ref 0–55)
AST: 66 U/L — AB (ref 5–34)
Alkaline Phosphatase: 326 U/L — ABNORMAL HIGH (ref 40–150)
Anion gap: 5 (ref 3–11)
BUN: 16 mg/dL (ref 7–26)
CHLORIDE: 114 mmol/L — AB (ref 98–109)
CO2: 23 mmol/L (ref 22–29)
CREATININE: 1.25 mg/dL — AB (ref 0.60–1.10)
Calcium: 9.8 mg/dL (ref 8.4–10.4)
GFR, EST NON AFRICAN AMERICAN: 45 mL/min — AB (ref >60)
GFR, Est AFR Am: 53 mL/min — ABNORMAL LOW (ref >60)
GLUCOSE: 97 mg/dL (ref 70–140)
Potassium: 3.6 mmol/L (ref 3.5–5.1)
Sodium: 142 mmol/L (ref 136–145)
Total Bilirubin: 1.5 mg/dL — ABNORMAL HIGH (ref 0.2–1.2)
Total Protein: 6.3 g/dL — ABNORMAL LOW (ref 6.4–8.3)

## 2017-06-29 LAB — CBC WITH DIFFERENTIAL (CANCER CENTER ONLY)
Basophils Absolute: 0 10*3/uL (ref 0.0–0.1)
Basophils Relative: 1 %
EOS ABS: 0.1 10*3/uL (ref 0.0–0.5)
EOS PCT: 3 %
HCT: 30.5 % — ABNORMAL LOW (ref 34.8–46.6)
HEMOGLOBIN: 9.9 g/dL — AB (ref 11.6–15.9)
Lymphocytes Relative: 23 %
Lymphs Abs: 1 10*3/uL (ref 0.9–3.3)
MCH: 33.4 pg (ref 26.0–34.0)
MCHC: 32.5 g/dL (ref 32.0–36.0)
MCV: 103 fL — AB (ref 81.0–101.0)
MONOS PCT: 15 %
Monocytes Absolute: 0.6 10*3/uL (ref 0.1–0.9)
Neutro Abs: 2.5 10*3/uL (ref 1.5–6.5)
Neutrophils Relative %: 58 %
PLATELETS: 76 10*3/uL — AB (ref 145–400)
RBC: 2.96 MIL/uL — ABNORMAL LOW (ref 3.70–5.32)
RDW: 14.9 % (ref 11.1–15.7)
WBC Count: 4.2 10*3/uL (ref 3.9–10.0)

## 2017-06-29 LAB — PLATELET BY CITRATE

## 2017-06-29 LAB — PROTIME-INR
INR: 1.28
Prothrombin Time: 15.9 seconds — ABNORMAL HIGH (ref 11.4–15.2)

## 2017-06-29 LAB — SAVE SMEAR

## 2017-06-29 LAB — APTT: APTT: 33 s (ref 24–36)

## 2017-06-29 LAB — VITAMIN B12: Vitamin B-12: 580 pg/mL (ref 180–914)

## 2017-06-29 NOTE — Telephone Encounter (Signed)
No LOS for 6/19 visit

## 2017-06-29 NOTE — Progress Notes (Signed)
Referral MD  Reason for Referral: Chronic thrombocytopenia  No chief complaint on file. : I just do not feel well.  HPI: Dana Watkins is a very nice 62 year old white female.  She has quite a few medical issues.  She actually has seen hematology about 3 years ago.  She was seen because of anemia and thrombocytopenia.  She actually underwent a bone marrow biopsy.  This was done on August 16, 2014.  The pathology report ([id number]) showed a slightly hypercellular marrow with trilineage hematopoiesis.  There was no dyspoiesis.  She had cytogenetics that were done.  Cytogenetics were normal.  I am unsure exactly what happened next.  I do not know if she was lost to follow-up.  She has been complaining of bruising.  She says this is been on her arms.  She does take Aleve.  I told her to stop taking the Aleve.  I told her that Aleve is an antiplatelet agent that can cause her to have bleeding and bruising.  She does not smoke.  She has occasional glass of wine.  There is no history in the family of any type of blood issues.  She has no obvious occupational exposures.  Again, she is going through a very difficult personal situation right now.  She is had no surgery.  She is through the change of life.  She is had no rashes.  She is a "somewhat vegetarian."  She has some tingling in her fingers.  There has been no weight loss or weight gain.  She was kindly referred to the [facility] for an evaluation.  Overall, her performance status is ECOG 0.     Past Medical History:  Diagnosis Date  . Acute hypokalemia 11/26/2014  . Arthritis 05/15/2015  . Benign essential hypertension 05/15/2015  . C. difficile colitis   . CAD (coronary artery disease)    a. cath 04/2017: ""Diffuse, calcific CAD particularly in the distal RCA and proximal to mid LAD.  LAD disease is nonobstructive.  RCA disease is more severe but does not appear significant.  Given her lack of symptoms,  would pursue medical therapy."  . CKD (chronic kidney disease), stage III ([place])   . Hypokalemia   . Migraine headache 05/15/2015  . Mild mitral regurgitation   . Pancytopenia ([place])   . Sepsis ([place]) 01/2017  . Thrombocytopenia ([place])    a. chronic thrombocytopenia (ITP - remotely saw hematology).  . Vitamin D deficiency   :  Past Surgical History:  Procedure Laterality Date  . CESAREAN SECTION     24 years ago  . LEFT HEART CATH AND CORONARY ANGIOGRAPHY N/A 05/12/2017   Procedure: LEFT HEART CATH AND CORONARY ANGIOGRAPHY;  Surgeon: [name] Booze, MD;  Location: [facility];  Service: Cardiovascular;  Laterality: N/A;  . tonsils and adneoids     as a child  . ULTRASOUND GUIDANCE FOR VASCULAR ACCESS  05/12/2017   Procedure: Ultrasound Guidance For Vascular Access;  Surgeon: [name] Booze, MD;  Location: [facility];  Service: Cardiovascular;;  :   Current Outpatient Medications:  .  amLODipine (NORVASC) 2.5 MG tablet, Take 1 tablet (2.5 mg total) by mouth daily., Disp: 30 tablet, Rfl: 6 .  atorvastatin (LIPITOR) 10 MG tablet, Take 1 tablet (10 mg total) by mouth daily., Disp: 30 tablet, Rfl: 6 .  b complex vitamins capsule, Take 1 capsule by mouth daily., Disp: , Rfl:  .  butalbital-acetaminophen-caffeine (FIORICET, ESGIC) 50-325-40 MG tablet, Take  1 tablet by mouth 3 (three) times daily as needed for headache. , Disp: , Rfl:  .  Cholecalciferol (VITAMIN D3) 2000 units TABS, Take 1 tablet by mouth daily., Disp: , Rfl:  .  MAGNESIUM PO, Take 400 mg by mouth daily. , Disp: , Rfl:  .  Multiple Vitamin (MULTIVITAMIN) tablet, Take 1 tablet by mouth daily., Disp: , Rfl:  .  potassium chloride (MICRO-K) 10 MEQ CR capsule, Take 20 mEq by mouth 2 (two) times daily., Disp: , Rfl: 1:  :  Allergies  Allergen Reactions  . Sulfa Antibiotics Nausea And Vomiting and Rash  :  Family History  Problem Relation Age of Onset  . Pernicious anemia Maternal Grandfather   .  Heart attack Maternal Grandfather   . Atrial fibrillation Mother   . Supraventricular tachycardia Father   . Heart disease Paternal Grandfather   :  Social History   Socioeconomic History  . Marital status: Married    Spouse name: Not on file  . Number of children: Not on file  . Years of education: Not on file  . Highest education level: Not on file  Occupational History  . Not on file  Social Needs  . Financial resource strain: Not on file  . Food insecurity:    Worry: Not on file    Inability: Not on file  . Transportation needs:    Medical: Not on file    Non-medical: Not on file  Tobacco Use  . Smoking status: Former Smoker    Years: 2.00    Start date: 06/25/1968    Last attempt to quit: 05/16/2014    Years since quitting: 3.1  . Smokeless tobacco: Never Used  Substance and Sexual Activity  . Alcohol use: Yes    Alcohol/week: 0.6 oz    Types: 1 Glasses of wine per week    Comment: occ  . Drug use: No  . Sexual activity: Not on file  Lifestyle  . Physical activity:    Days per week: Not on file    Minutes per session: Not on file  . Stress: Not on file  Relationships  . Social connections:    Talks on phone: Not on file    Gets together: Not on file    Attends religious service: Not on file    Active member of club or organization: Not on file    Attends meetings of clubs or organizations: Not on file    Relationship status: Not on file  . Intimate partner violence:    Fear of current or ex partner: Not on file    Emotionally abused: Not on file    Physically abused: Not on file    Forced sexual activity: Not on file  Other Topics Concern  . Not on file  Social History Narrative   Lives with husband  In home.  Has seasonal allergies.  Drinks coffee, 1 cup AM and 3 plus sweet tea.  Has 3 grown kids.    :  Review of Systems  Constitutional: Positive for malaise/fatigue.  HENT: Negative.   Eyes: Negative.   Respiratory: Negative.   Cardiovascular:  Negative.   Gastrointestinal: Negative.   Genitourinary: Negative.   Musculoskeletal: Negative.   Skin: Negative.   Neurological: Negative.   Endo/Heme/Allergies: Bruises/bleeds easily.  Psychiatric/Behavioral: Negative.      Exam: Well-developed and well-nourished white female in no obvious distress.  Her vital signs show temperature of 97.8.  Pulse 72.  Blood pressure 138/72.  Weight  is 144 pounds.  Head neck exam shows no ocular or oral lesions.  She has no adenopathy in the neck.  Thyroid is nonpalpable.  Lungs are clear bilaterally.  Cardiac exam regular rate and rhythm with no murmurs, rubs or bruits.  Abdomen is soft.  She has good bowel sounds.  There is no fluid wave.  There is no palpable liver or spleen tip.  Back exam shows no tenderness over the spine, ribs or hips.  Extremities shows no clubbing, cyanosis or edema.  Skin exam shows very few ecchymoses.  Neurological exam shows no focal neurological deficits. _0 @   Recent Labs    06/29/17 1047  WBC 4.2  HGB 9.9*  HCT 30.5*  PLT 76*   No results for input(s): NA, K, CL, CO2, GLUCOSE, BUN, CREATININE, CALCIUM in the last 72 hours.  Blood smear review: Normochromic and normocytic population of red blood cells.  I see no rouleaux formation.  There are no target cells.  She has no nucleated red blood cells.  I see no schistocytes or spherocytes.  White blood cells are normal in morphology and maturation.  She has no hypersegmented polys.  I see no immature myeloid or lymphoid forms.  Platelets are decreased in number.  Platelets appear to be relatively uniform in size.  There may be a few large platelets.  Platelets are well granulated.  Pathology: None    Assessment and Plan: Dana Watkins is a charming 62 year old white female.  She has thrombocytopenia.  This is been somewhat chronic.  She also has anemia now.  I am not sure exactly what might be going on.  I suppose she may have some chronic immune thrombocytopenia.   If this is the case, it is certainly mild in nature.  She is under a lot of stress.  I do not know if this might be causing some of the hematologic abnormalities.  We did get her vitamin B12 level.  This was normal at 580.  She has a slightly elevated creatinine.  As such, she may have a low erythropoietin level.  Her MCV is on the higher side.  I do not think she is bleeding.  I would nothing that she has myelodysplasia but yet this is always a possibility.  The bone marrow that she had done 3 years ago certainly had no evidence of myelodysplasia.  For right now, I think we can just watch her.  I would be surprised if we find any hematologic abnormality.  I do not believe there is any hematologic malignancy.  We spent about 45 minutes with Ms. Mckeone.  All the time was spent face-to-face.  We were counseling her.  We went over all of her lab work.  We answered all of her questions.  I think we can probably get her back to see [place] in another 3 months or so.

## 2017-06-30 LAB — IRON AND TIBC
Iron: 70 ug/dL (ref 41–142)
SATURATION RATIOS: 27 % (ref 21–57)
TIBC: 261 ug/dL (ref 236–444)
UIBC: 192 ug/dL

## 2017-06-30 LAB — TSH: TSH: 1.336 u[IU]/mL (ref 0.308–3.960)

## 2017-06-30 LAB — VON WILLEBRAND PANEL
COAGULATION FACTOR VIII: 135 % (ref 56–140)
RISTOCETIN CO-FACTOR, PLASMA: 220 % — AB (ref 50–200)
VON WILLEBRAND ANTIGEN, PLASMA: 321 % — AB (ref 50–200)

## 2017-06-30 LAB — FERRITIN: Ferritin: 43 ng/mL (ref 9–269)

## 2017-06-30 LAB — COAG STUDIES INTERP REPORT

## 2017-07-05 ENCOUNTER — Telehealth: Payer: Self-pay | Admitting: *Deleted

## 2017-07-05 NOTE — Telephone Encounter (Signed)
-----   Message from [name], MD sent at 07/05/2017  6:48 AM EDT ----- Call - all of the labs are normal!!  Dana Watkins

## 2017-07-05 NOTE — Telephone Encounter (Signed)
Message left for pt to inform her that all labs are normal per order of Dr. Marin Olp.  Pt instructed to call office back with any questions or concerns.

## 2017-07-06 ENCOUNTER — Other Ambulatory Visit: Payer: BLUE CROSS/BLUE SHIELD

## 2017-07-13 ENCOUNTER — Other Ambulatory Visit: Payer: BLUE CROSS/BLUE SHIELD | Admitting: *Deleted

## 2017-07-13 DIAGNOSIS — I2510 Atherosclerotic heart disease of native coronary artery without angina pectoris: Secondary | ICD-10-CM

## 2017-07-13 LAB — HEPATIC FUNCTION PANEL
ALBUMIN: 3.6 g/dL (ref 3.6–4.8)
ALT: 24 IU/L (ref 0–32)
AST: 62 IU/L — AB (ref 0–40)
Alkaline Phosphatase: 295 IU/L — ABNORMAL HIGH (ref 39–117)
BILIRUBIN TOTAL: 1.3 mg/dL — AB (ref 0.0–1.2)
Bilirubin, Direct: 0.59 mg/dL — ABNORMAL HIGH (ref 0.00–0.40)
TOTAL PROTEIN: 5.9 g/dL — AB (ref 6.0–8.5)

## 2017-07-13 LAB — LIPID PANEL
Chol/HDL Ratio: 2.7 ratio (ref 0.0–4.4)
Cholesterol, Total: 184 mg/dL (ref 100–199)
HDL: 67 mg/dL (ref >39)
LDL CALC: 94 mg/dL (ref 0–99)
Triglycerides: 116 mg/dL (ref 0–149)
VLDL Cholesterol Cal: 23 mg/dL (ref 5–40)

## 2017-07-28 ENCOUNTER — Telehealth: Payer: Self-pay | Admitting: Physician Assistant

## 2017-07-28 NOTE — Telephone Encounter (Signed)
Spoke to patient, informed her of the recent lab results and recommendations.  She verbalized understanding.

## 2017-07-28 NOTE — Telephone Encounter (Signed)
New Message    Patient returning call in reference to lab results. Please call

## 2017-08-08 ENCOUNTER — Encounter: Payer: Self-pay | Admitting: *Deleted

## 2017-10-05 ENCOUNTER — Other Ambulatory Visit: Payer: Self-pay | Admitting: Family Medicine

## 2017-10-05 ENCOUNTER — Other Ambulatory Visit (HOSPITAL_COMMUNITY)
Admission: RE | Admit: 2017-10-05 | Discharge: 2017-10-05 | Disposition: A | Discharge status: Home / Self Care | Payer: BLUE CROSS/BLUE SHIELD | Source: Ambulatory Visit | Attending: Family Medicine | Admitting: Family Medicine

## 2017-10-05 DIAGNOSIS — Z124 Encounter for screening for malignant neoplasm of cervix: Secondary | ICD-10-CM | POA: Insufficient documentation

## 2017-10-11 LAB — CYTOLOGY - PAP: Diagnosis: NEGATIVE

## 2017-11-06 DIAGNOSIS — F411 Generalized anxiety disorder: Secondary | ICD-10-CM | POA: Insufficient documentation

## 2017-11-17 ENCOUNTER — Ambulatory Visit: Payer: Self-pay | Admitting: Psychiatry

## 2018-01-09 ENCOUNTER — Ambulatory Visit: Payer: Self-pay | Admitting: Psychiatry

## 2018-01-18 ENCOUNTER — Encounter: Payer: Self-pay | Admitting: Emergency Medicine

## 2018-01-28 ENCOUNTER — Other Ambulatory Visit: Payer: Self-pay | Admitting: Psychiatry

## 2018-01-29 ENCOUNTER — Other Ambulatory Visit: Payer: Self-pay | Admitting: Psychiatry

## 2018-01-30 NOTE — Telephone Encounter (Signed)
Need to review paper chart not seen in epic, check last ov

## 2018-01-30 NOTE — Telephone Encounter (Signed)
Paper chart review

## 2018-02-15 ENCOUNTER — Ambulatory Visit: Payer: Self-pay | Admitting: Psychiatry

## 2018-02-23 ENCOUNTER — Other Ambulatory Visit: Payer: Self-pay | Admitting: Psychiatry

## 2018-04-01 ENCOUNTER — Other Ambulatory Visit: Payer: Self-pay | Admitting: Psychiatry

## 2018-04-03 NOTE — Telephone Encounter (Signed)
Can you check this, not seen in epic yet. I'm not able to review paper chart

## 2018-04-27 ENCOUNTER — Other Ambulatory Visit: Payer: Self-pay | Admitting: Psychiatry

## 2018-06-09 ENCOUNTER — Other Ambulatory Visit: Payer: Self-pay

## 2018-06-09 ENCOUNTER — Observation Stay (HOSPITAL_COMMUNITY)
Admission: EM | Admit: 2018-06-09 | Discharge: 2018-06-10 | Disposition: A | Discharge status: Home / Self Care | Payer: BLUE CROSS/BLUE SHIELD | Attending: Family Medicine | Admitting: Family Medicine

## 2018-06-09 ENCOUNTER — Emergency Department (HOSPITAL_COMMUNITY): Payer: BLUE CROSS/BLUE SHIELD

## 2018-06-09 ENCOUNTER — Encounter (HOSPITAL_COMMUNITY): Payer: Self-pay

## 2018-06-09 DIAGNOSIS — N179 Acute kidney failure, unspecified: Secondary | ICD-10-CM | POA: Diagnosis not present

## 2018-06-09 DIAGNOSIS — F419 Anxiety disorder, unspecified: Secondary | ICD-10-CM | POA: Diagnosis not present

## 2018-06-09 DIAGNOSIS — Z8249 Family history of ischemic heart disease and other diseases of the circulatory system: Secondary | ICD-10-CM | POA: Insufficient documentation

## 2018-06-09 DIAGNOSIS — Z79899 Other long term (current) drug therapy: Secondary | ICD-10-CM | POA: Insufficient documentation

## 2018-06-09 DIAGNOSIS — S81012A Laceration without foreign body, left knee, initial encounter: Secondary | ICD-10-CM | POA: Diagnosis not present

## 2018-06-09 DIAGNOSIS — Z87891 Personal history of nicotine dependence: Secondary | ICD-10-CM | POA: Diagnosis not present

## 2018-06-09 DIAGNOSIS — R740 Nonspecific elevation of levels of transaminase and lactic acid dehydrogenase [LDH]: Secondary | ICD-10-CM | POA: Diagnosis not present

## 2018-06-09 DIAGNOSIS — I2510 Atherosclerotic heart disease of native coronary artery without angina pectoris: Secondary | ICD-10-CM | POA: Insufficient documentation

## 2018-06-09 DIAGNOSIS — I509 Heart failure, unspecified: Secondary | ICD-10-CM | POA: Diagnosis not present

## 2018-06-09 DIAGNOSIS — Z881 Allergy status to other antibiotic agents status: Secondary | ICD-10-CM | POA: Insufficient documentation

## 2018-06-09 DIAGNOSIS — Z1159 Encounter for screening for other viral diseases: Secondary | ICD-10-CM | POA: Insufficient documentation

## 2018-06-09 DIAGNOSIS — I10 Essential (primary) hypertension: Secondary | ICD-10-CM

## 2018-06-09 DIAGNOSIS — R112 Nausea with vomiting, unspecified: Secondary | ICD-10-CM

## 2018-06-09 DIAGNOSIS — Z791 Long term (current) use of non-steroidal anti-inflammatories (NSAID): Secondary | ICD-10-CM | POA: Diagnosis not present

## 2018-06-09 DIAGNOSIS — R05 Cough: Secondary | ICD-10-CM

## 2018-06-09 DIAGNOSIS — J181 Lobar pneumonia, unspecified organism: Secondary | ICD-10-CM

## 2018-06-09 DIAGNOSIS — R748 Abnormal levels of other serum enzymes: Secondary | ICD-10-CM | POA: Insufficient documentation

## 2018-06-09 DIAGNOSIS — I130 Hypertensive heart and chronic kidney disease with heart failure and stage 1 through stage 4 chronic kidney disease, or unspecified chronic kidney disease: Secondary | ICD-10-CM | POA: Insufficient documentation

## 2018-06-09 DIAGNOSIS — W010XXA Fall on same level from slipping, tripping and stumbling without subsequent striking against object, initial encounter: Secondary | ICD-10-CM | POA: Insufficient documentation

## 2018-06-09 DIAGNOSIS — R059 Cough, unspecified: Secondary | ICD-10-CM

## 2018-06-09 DIAGNOSIS — N183 Chronic kidney disease, stage 3 (moderate): Secondary | ICD-10-CM | POA: Insufficient documentation

## 2018-06-09 DIAGNOSIS — Z882 Allergy status to sulfonamides status: Secondary | ICD-10-CM | POA: Insufficient documentation

## 2018-06-09 DIAGNOSIS — D696 Thrombocytopenia, unspecified: Secondary | ICD-10-CM | POA: Diagnosis present

## 2018-06-09 DIAGNOSIS — J189 Pneumonia, unspecified organism: Principal | ICD-10-CM | POA: Diagnosis present

## 2018-06-09 DIAGNOSIS — G43909 Migraine, unspecified, not intractable, without status migrainosus: Secondary | ICD-10-CM | POA: Insufficient documentation

## 2018-06-09 LAB — COMPREHENSIVE METABOLIC PANEL
ALT: 34 U/L (ref 0–44)
AST: 81 U/L — ABNORMAL HIGH (ref 15–41)
Albumin: 3.6 g/dL (ref 3.5–5.0)
Alkaline Phosphatase: 225 U/L — ABNORMAL HIGH (ref 38–126)
Anion gap: 10 (ref 5–15)
BUN: 17 mg/dL (ref 8–23)
CO2: 16 mmol/L — ABNORMAL LOW (ref 22–32)
Calcium: 10.3 mg/dL (ref 8.9–10.3)
Chloride: 112 mmol/L — ABNORMAL HIGH (ref 98–111)
Creatinine, Ser: 1.64 mg/dL — ABNORMAL HIGH (ref 0.44–1.00)
GFR calc Af Amer: 38 mL/min — ABNORMAL LOW (ref >60)
GFR calc non Af Amer: 33 mL/min — ABNORMAL LOW (ref >60)
Glucose, Bld: 108 mg/dL — ABNORMAL HIGH (ref 70–99)
Potassium: 2.3 mmol/L — CL (ref 3.5–5.1)
Sodium: 138 mmol/L (ref 135–145)
Total Bilirubin: 4.4 mg/dL — ABNORMAL HIGH (ref 0.3–1.2)
Total Protein: 7.1 g/dL (ref 6.5–8.1)

## 2018-06-09 LAB — URINALYSIS, ROUTINE W REFLEX MICROSCOPIC
Bacteria, UA: NONE SEEN
Bilirubin Urine: NEGATIVE
Glucose, UA: 50 mg/dL — AB
Ketones, ur: NEGATIVE mg/dL
Leukocytes,Ua: NEGATIVE
Nitrite: NEGATIVE
Protein, ur: 30 mg/dL — AB
Specific Gravity, Urine: 1.016 (ref 1.005–1.030)
pH: 5 (ref 5.0–8.0)

## 2018-06-09 LAB — BILIRUBIN, FRACTIONATED(TOT/DIR/INDIR)
Bilirubin, Direct: 1 mg/dL — ABNORMAL HIGH (ref 0.0–0.2)
Indirect Bilirubin: 3.1 mg/dL — ABNORMAL HIGH (ref 0.3–0.9)
Total Bilirubin: 4.1 mg/dL — ABNORMAL HIGH (ref 0.3–1.2)

## 2018-06-09 LAB — CBC
HCT: 31.1 % — ABNORMAL LOW (ref 36.0–46.0)
Hemoglobin: 10.3 g/dL — ABNORMAL LOW (ref 12.0–15.0)
MCH: 32.5 pg (ref 26.0–34.0)
MCHC: 33.1 g/dL (ref 30.0–36.0)
MCV: 98.1 fL (ref 80.0–100.0)
Platelets: 89 10*3/uL — ABNORMAL LOW (ref 150–400)
RBC: 3.17 MIL/uL — ABNORMAL LOW (ref 3.87–5.11)
RDW: 16.1 % — ABNORMAL HIGH (ref 11.5–15.5)
WBC: 6.3 10*3/uL (ref 4.0–10.5)
nRBC: 0 % (ref 0.0–0.2)

## 2018-06-09 LAB — LIPASE, BLOOD: Lipase: 44 U/L (ref 11–51)

## 2018-06-09 LAB — SARS CORONAVIRUS 2 BY RT PCR (HOSPITAL ORDER, PERFORMED IN ~~LOC~~ HOSPITAL LAB): SARS Coronavirus 2: NEGATIVE

## 2018-06-09 MED ORDER — POTASSIUM CHLORIDE 10 MEQ/100ML IV SOLN
10.0000 meq | Freq: Once | INTRAVENOUS | Status: AC
  Administered 2018-06-09: 10 meq via INTRAVENOUS
  Filled 2018-06-09: qty 100

## 2018-06-09 MED ORDER — POTASSIUM CHLORIDE CRYS ER 20 MEQ PO TBCR: 40.0000 meq | EXTENDED_RELEASE_TABLET | Freq: Once | ORAL | Status: DC

## 2018-06-09 MED ORDER — ENSURE ENLIVE PO LIQD
237.0000 mL | Freq: Two times a day (BID) | ORAL | Status: DC
  Administered 2018-06-10: 10:00:00 237 mL via ORAL

## 2018-06-09 MED ORDER — SODIUM CHLORIDE 0.9 % IV SOLN
500.0000 mg | INTRAVENOUS | Status: DC
  Administered 2018-06-09: 500 mg via INTRAVENOUS
  Filled 2018-06-09 (×2): qty 500

## 2018-06-09 MED ORDER — VITAMIN D 25 MCG (1000 UNIT) PO TABS
1000.0000 [IU] | ORAL_TABLET | Freq: Every day | ORAL | Status: DC
  Administered 2018-06-09 – 2018-06-10 (×2): 1000 [IU] via ORAL
  Filled 2018-06-09 (×3): qty 1

## 2018-06-09 MED ORDER — SODIUM CHLORIDE 0.9 % IV SOLN
2.0000 g | INTRAVENOUS | Status: DC
  Administered 2018-06-09: 14:00:00 2 g via INTRAVENOUS
  Filled 2018-06-09 (×2): qty 20

## 2018-06-09 MED ORDER — SODIUM CHLORIDE 0.9% FLUSH: 3.0000 mL | Freq: Once | INTRAVENOUS | Status: DC

## 2018-06-09 MED ORDER — SODIUM CHLORIDE 0.9 % IV BOLUS
1000.0000 mL | Freq: Once | INTRAVENOUS | Status: AC
  Administered 2018-06-09: 1000 mL via INTRAVENOUS

## 2018-06-09 MED ORDER — MAGNESIUM OXIDE 400 (241.3 MG) MG PO TABS
400.0000 mg | ORAL_TABLET | Freq: Every day | ORAL | Status: DC
  Administered 2018-06-09 – 2018-06-10 (×2): 400 mg via ORAL
  Filled 2018-06-09 (×3): qty 1

## 2018-06-09 MED ORDER — METOCLOPRAMIDE HCL 5 MG/ML IJ SOLN
5.0000 mg | Freq: Once | INTRAMUSCULAR | Status: AC
  Administered 2018-06-09: 5 mg via INTRAVENOUS
  Filled 2018-06-09: qty 2

## 2018-06-09 MED ORDER — MAGNESIUM 200 MG PO TABS: 400.0000 mg | ORAL_TABLET | Freq: Every day | ORAL | Status: DC

## 2018-06-09 MED ORDER — SODIUM CHLORIDE 0.9 % IV SOLN
INTRAVENOUS | Status: DC
  Administered 2018-06-09 (×2): via INTRAVENOUS

## 2018-06-09 MED ORDER — POTASSIUM CHLORIDE CRYS ER 20 MEQ PO TBCR
60.0000 meq | EXTENDED_RELEASE_TABLET | Freq: Once | ORAL | Status: AC
  Administered 2018-06-09: 60 meq via ORAL
  Filled 2018-06-09: qty 3

## 2018-06-09 MED ORDER — POTASSIUM CHLORIDE CRYS ER 20 MEQ PO TBCR
20.0000 meq | EXTENDED_RELEASE_TABLET | Freq: Two times a day (BID) | ORAL | Status: DC
  Administered 2018-06-09 – 2018-06-10 (×2): 20 meq via ORAL
  Filled 2018-06-09 (×2): qty 1

## 2018-06-09 MED ORDER — VITAMIN D3 50 MCG (2000 UT) PO TABS: 1.0000 | ORAL_TABLET | Freq: Every day | ORAL | Status: DC

## 2018-06-09 NOTE — ED Notes (Signed)
Date and time results received: 06/09/18 1300 (use smartphrase ".now" to insert current time)  Test: potassium Critical Value: 2.3  Name of Provider Notified: Dr Zenia Resides  Orders Received? Or Actions Taken?: Actions Taken: potassium order placed

## 2018-06-09 NOTE — ED Notes (Signed)
Patient reports she fell 2 days ago getting in bath tub, injuring left knee

## 2018-06-09 NOTE — ED Notes (Signed)
ED TO INPATIENT HANDOFF REPORT  Name/Age/Gender Dana Watkins 63 y.o. female  Code Status Code Status History    Date Active Date Inactive Code Status Order ID Comments User Context   05/12/2017 1219 05/12/2017 1927 Full Code [id number]  [name] Booze, MD Inpatient   02/02/2017 2320 02/06/2017 2010 Full Code [id number]  Etta Quill, DO ED      Home/SNF/Other Home  Chief Complaint vomitting,nausea  Level of Care/Admitting Diagnosis ED Disposition    ED Disposition Condition [facility] Area: [place] [100102]  Level of Care: Med-Surg [16]  Covid Evaluation: Confirmed COVID Negative  Diagnosis: Community acquired pneumonia of right middle lobe of lung [facility]) [[id number]]  Admitting Physician: Mariel Aloe [phone]  Attending Physician: Mariel Aloe [phone]  PT Class (Do Not Modify): Observation [104]  PT Acc Code (Do Not Modify): Observation [10022]       Medical History Past Medical History:  Diagnosis Date  . Acute hypokalemia 11/26/2014  . Arthritis 05/15/2015  . Benign essential hypertension 05/15/2015  . C. difficile colitis   . CAD (coronary artery disease)    a. cath 04/2017: ""Diffuse, calcific CAD particularly in the distal RCA and proximal to mid LAD.  LAD disease is nonobstructive.  RCA disease is more severe but does not appear significant.  Given her lack of symptoms, would pursue medical therapy."  . CKD (chronic kidney disease), stage III ([place])   . Hypokalemia   . Migraine headache 05/15/2015  . Mild mitral regurgitation   . Pancytopenia ([place])   . Sepsis ([facility]) 01/2017  . Thrombocytopenia ([place])    a. chronic thrombocytopenia (ITP - remotely saw hematology).  . Vitamin D deficiency     Allergies Allergies  Allergen Reactions  . Sulfa Antibiotics Nausea And Vomiting and Rash    IV Location/Drains/Wounds Patient Lines/Drains/Airways Status   Active Line/Drains/Airways    Name:   Placement date:   Placement  time:   Site:   Days:   Peripheral IV 06/09/18 Right;Lateral Antecubital   06/09/18    1200    Antecubital   less than 1   Peripheral IV 06/09/18 Left Forearm   06/09/18    1335    Forearm   less than 1   Sheath 05/12/17 Right Arterial;Radial   05/12/17    1140    Arterial;Radial   393          Labs/Imaging Results for orders placed or performed during the hospital encounter of 06/09/18 (from the past 48 hour(s))  Lipase, blood     Status: None   Collection Time: 06/09/18 12:01 PM  Result Value Ref Range   Lipase 44 11 - 51 U/L    Comment: Performed at [facility], [place] [address] [place], [place] [address]  Comprehensive metabolic panel     Status: Abnormal   Collection Time: 06/09/18 12:01 PM  Result Value Ref Range   Sodium 138 135 - 145 mmol/L   Potassium 2.3 (LL) 3.5 - 5.1 mmol/L    Comment: CRITICAL RESULT CALLED TO, READ BACK BY AND VERIFIED WITH: BINGHAM,S. RN AT 1254 06/09/18 MULLINS,T    Chloride 112 (H) 98 - 111 mmol/L   CO2 16 (L) 22 - 32 mmol/L   Glucose, Bld 108 (H) 70 - 99 mg/dL   BUN 17 8 - 23 mg/dL   Creatinine, Ser 1.64 (H) 0.44 - 1.00 mg/dL   Calcium 10.3 8.9 - 10.3 mg/dL  Total Protein 7.1 6.5 - 8.1 g/dL   Albumin 3.6 3.5 - 5.0 g/dL   AST 81 (H) 15 - 41 U/L   ALT 34 0 - 44 U/L   Alkaline Phosphatase 225 (H) 38 - 126 U/L   Total Bilirubin 4.4 (H) 0.3 - 1.2 mg/dL   GFR calc non Af Amer 33 (L) >60 mL/min   GFR calc Af Amer 38 (L) >60 mL/min   Anion gap 10 5 - 15    Comment: Performed at [facility], [place] [address], [place], [place] [address]  CBC     Status: Abnormal   Collection Time: 06/09/18 12:01 PM  Result Value Ref Range   WBC 6.3 4.0 - 10.5 K/uL   RBC 3.17 (L) 3.87 - 5.11 MIL/uL   Hemoglobin 10.3 (L) 12.0 - 15.0 g/dL   HCT 31.1 (L) 36.0 - 46.0 %   MCV 98.1 80.0 - 100.0 fL   MCH 32.5 26.0 - 34.0 pg   MCHC 33.1 30.0 - 36.0 g/dL   RDW 16.1 (H) 11.5 - 15.5 %   Platelets 89 (L) 150 - 400 K/uL    Comment:  REPEATED TO VERIFY PLATELET COUNT CONFIRMED BY SMEAR SPECIMEN CHECKED FOR CLOTS Immature Platelet Fraction may be clinically indicated, consider ordering this additional test [id number]    nRBC 0.0 0.0 - 0.2 %    Comment: Performed at [facility], [place] [address] [place], [place] [address]  Urinalysis, Routine w reflex microscopic     Status: Abnormal   Collection Time: 06/09/18 12:01 PM  Result Value Ref Range   Color, Urine YELLOW YELLOW   APPearance HAZY (A) CLEAR   Specific Gravity, Urine 1.016 1.005 - 1.030   pH 5.0 5.0 - 8.0   Glucose, UA 50 (A) NEGATIVE mg/dL   Hgb urine dipstick MODERATE (A) NEGATIVE   Bilirubin Urine NEGATIVE NEGATIVE   Ketones, ur NEGATIVE NEGATIVE mg/dL   Protein, ur 30 (A) NEGATIVE mg/dL   Nitrite NEGATIVE NEGATIVE   Leukocytes,Ua NEGATIVE NEGATIVE   RBC / HPF 0-5 0 - 5 RBC/hpf   WBC, UA 11-20 0 - 5 WBC/hpf   Bacteria, UA NONE SEEN NONE SEEN   Squamous Epithelial / LPF 0-5 0 - 5   Mucus PRESENT     Comment: Performed at [facility], [place] [address], [place], [place] [address]  SARS Coronavirus 2 (CEPHEID - Performed in [facility] hospital lab), Hosp Order     Status: None   Collection Time: 06/09/18  1:27 PM  Result Value Ref Range   SARS Coronavirus 2 NEGATIVE NEGATIVE    Comment: (NOTE) If result is NEGATIVE SARS-CoV-2 target nucleic acids are NOT DETECTED. The SARS-CoV-2 RNA is generally detectable in upper and lower  respiratory specimens during the acute phase of infection. The lowest  concentration of SARS-CoV-2 viral copies this assay can detect is 250  copies / mL. A negative result does not preclude SARS-CoV-2 infection  and should not be used as the sole basis for treatment or other  patient management decisions.  A negative result may occur with  improper specimen collection / handling, submission of specimen other  than nasopharyngeal swab, presence of viral mutation(s) within the  areas  targeted by this assay, and inadequate number of viral copies  (<250 copies / mL). A negative result must be combined with clinical  observations, patient history, and epidemiological information. If result is POSITIVE SARS-CoV-2 target nucleic acids are DETECTED. The SARS-CoV-2  RNA is generally detectable in upper and lower  respiratory specimens dur ing the acute phase of infection.  Positive  results are indicative of active infection with SARS-CoV-2.  Clinical  correlation with patient history and other diagnostic information is  necessary to determine patient infection status.  Positive results do  not rule out bacterial infection or co-infection with other viruses. If result is PRESUMPTIVE POSTIVE SARS-CoV-2 nucleic acids MAY BE PRESENT.   A presumptive positive result was obtained on the submitted specimen  and confirmed on repeat testing.  While 2019 novel coronavirus  (SARS-CoV-2) nucleic acids may be present in the submitted sample  additional confirmatory testing may be necessary for epidemiological  and / or clinical management purposes  to differentiate between  SARS-CoV-2 and other Sarbecovirus currently known to infect humans.  If clinically indicated additional testing with an alternate test  methodology [phone]) is advised. The SARS-CoV-2 RNA is generally  detectable in upper and lower respiratory sp ecimens during the acute  phase of infection. The expected result is Negative. Fact Sheet for Patients:  StrictlyIdeas.no Fact Sheet for Healthcare Providers: BankingDealers.co.za This test is not yet approved or cleared by the [place] FDA and has been authorized for detection and/or diagnosis of SARS-CoV-2 by FDA under an Emergency Use Authorization (EUA).  This EUA will remain in effect (meaning this test can be used) for the duration of the COVID-19 declaration under Section 564(b)(1) of the Act, 21 U.S.C. section  360bbb-3(b)(1), unless the authorization is terminated or revoked sooner. Performed at [facility], [place] [address], [place], [place] [address]    Dg Chest 2 View  Result Date: 06/09/2018 CLINICAL DATA:  Nonproductive cough. EXAM: CHEST - 2 VIEW COMPARISON:  Chest x-ray 02/02/2017. FINDINGS: Mediastinum and hilar structures normal. Mild right mid lung field infiltrate cannot be excluded. No pleural effusion or pneumothorax. Stable cardiomegaly. Diffuse thoracic spine osteopenia degenerative change. IMPRESSION: 1. Mild right mid lung field infiltrate cannot be excluded. 2.  Stable cardiomegaly. No pulmonary venous congestion. Electronically Signed   By: Marcello Moores  Register   On: 06/09/2018 13:08    Pending Labs Unresulted Labs (From admission, onward)    Start     Ordered   06/09/18 1322  Culture, blood (Routine X 2) w Reflex to ID Panel  BLOOD CULTURE X 2,   R     06/09/18 1321   Signed and Held  Culture, sputum-assessment  Once,   R     Signed and Held   Signed and Held  Gram stain  Once,   R     Signed and Held   Signed and Held  HIV antibody (Routine Screening)  Tomorrow morning,   R     Signed and Held   Signed and Held  Strep pneumoniae urinary antigen  Once,   R     Signed and Held   Signed and Held  Creatinine, serum  (enoxaparin (LOVENOX)    CrCl >/= 30 ml/min)  Weekly,   R    Comments:  while on enoxaparin therapy    Signed and Held   Signed and Held  Legionella Pneumophila Serogp 1 Ur Ag  Once,   R     Signed and Held          Vitals/Pain Today's Vitals   06/09/18 1400 06/09/18 1434 06/09/18 1500 06/09/18 1630  BP:  (!) 154/80 (!) 142/81 136/77  Pulse: 82 83 80 75  Resp: 20 (!) 21 17 (!) 23  Temp:      TempSrc:      SpO2: 99% 100% 98% 100%  Weight:      Height:      PainSc:        Isolation Precautions No active isolations  Medications Medications  sodium chloride flush (NS) 0.9 % injection 3 mL (0 mLs Intravenous Hold 06/09/18 1419)   0.9 %  sodium chloride infusion ( Intravenous New Bag/Given 06/09/18 1229)  cefTRIAXone (ROCEPHIN) 2 g in sodium chloride 0.9 % 100 mL IVPB (0 g Intravenous Stopped 06/09/18 1446)  azithromycin (ZITHROMAX) 500 mg in sodium chloride 0.9 % 250 mL IVPB (0 mg Intravenous Stopped 06/09/18 1531)  sodium chloride 0.9 % bolus 1,000 mL (0 mLs Intravenous Stopped 06/09/18 1319)  metoCLOPramide (REGLAN) injection 5 mg (5 mg Intravenous Given 06/09/18 1222)  potassium chloride 10 mEq in 100 mL IVPB (0 mEq Intravenous Stopped 06/09/18 1417)  sodium chloride 0.9 % bolus 1,000 mL (0 mLs Intravenous Stopped 06/09/18 1415)  potassium chloride SA (K-DUR) CR tablet 60 mEq (60 mEq Oral Given 06/09/18 1432)    Mobility walks

## 2018-06-09 NOTE — ED Provider Notes (Signed)
[facility] DEPT Provider Note   CSN: [id number] Arrival date & time: 06/09/18  1116    History   Chief Complaint Chief Complaint  Patient presents with  . Cough  . Emesis  . Fall  . Knee Injury    HPI Dana Watkins is a 63 y.o. female.     63 year old female presents with cough x2 weeks without fever or chills.  No real dyspnea.  Also notes nausea and vomiting without diarrhea or fever x4 days.  Denies any urinary symptoms.  Did have mechanical fall few days ago and injured her left lower extremity but she says that is healing and that she has no trouble with her gait.  Denies any hip or back pain.  Saw her doctor and was sent here for further evaluation for possible dehydration     Past Medical History:  Diagnosis Date  . Acute hypokalemia 11/26/2014  . Arthritis 05/15/2015  . Benign essential hypertension 05/15/2015  . C. difficile colitis   . CAD (coronary artery disease)    a. cath 04/2017: ""Diffuse, calcific CAD particularly in the distal RCA and proximal to mid LAD.  LAD disease is nonobstructive.  RCA disease is more severe but does not appear significant.  Given her lack of symptoms, would pursue medical therapy."  . CKD (chronic kidney disease), stage III ([place])   . Hypokalemia   . Migraine headache 05/15/2015  . Mild mitral regurgitation   . Pancytopenia ([place])   . Sepsis ([place]) 01/2017  . Thrombocytopenia ([place])    a. chronic thrombocytopenia (ITP - remotely saw hematology).  . Vitamin D deficiency     Patient Active Problem List   Diagnosis Date Noted  . GAD (generalized anxiety disorder) 11/06/2017  . Abnormal findings on diagnostic imaging of heart and coronary circulation   . Family history of coronary artery disease 05/09/2017  . DOE (dyspnea on exertion) 05/09/2017  . Coronary artery calcification seen on CT scan 03/08/2017  . Sepsis ([place]) 02/06/2017  . Acute bronchitis 02/06/2017  . Dehydration   . Diarrhea 02/03/2017   . C. difficile colitis 02/03/2017  . Nausea vomiting and diarrhea 02/02/2017  . Arthritis 05/15/2015  . Benign essential hypertension 05/15/2015  . Migraine headache 05/15/2015  . Hypokalemia 11/26/2014  . Acute renal failure ([place]) 11/26/2014  . Elevated alkaline phosphatase level 11/26/2014  . Abnormal weight gain 11/26/2014  . Chronic leukopenia 05/28/2014  . Anemia of chronic illness 05/28/2014  . Thrombocytopenia ([place]) 04/30/2013  . Elevated liver function tests 04/30/2013    Past Surgical History:  Procedure Laterality Date  . CESAREAN SECTION     24 years ago  . LEFT HEART CATH AND CORONARY ANGIOGRAPHY N/A 05/12/2017   Procedure: LEFT HEART CATH AND CORONARY ANGIOGRAPHY;  Surgeon: [name] Booze, MD;  Location: [facility];  Service: Cardiovascular;  Laterality: N/A;  . tonsils and adneoids     as a child  . ULTRASOUND GUIDANCE FOR VASCULAR ACCESS  05/12/2017   Procedure: Ultrasound Guidance For Vascular Access;  Surgeon: [name] Booze, MD;  Location: [facility];  Service: Cardiovascular;;     OB History   No obstetric history on file.      Home Medications    Prior to Admission medications   Medication Sig Start Date End Date Taking? Authorizing Provider  amLODipine (NORVASC) 2.5 MG tablet Take 1 tablet (2.5 mg total) by mouth daily. 05/25/17 08/23/17  Dunn, Nedra Hai, PA-C  atorvastatin (LIPITOR) 10  MG tablet Take 1 tablet (10 mg total) by mouth daily. 05/25/17 08/23/17  Dunn, Nedra Hai, PA-C  b complex vitamins capsule Take 1 capsule by mouth daily.    [provider]  butalbital-acetaminophen-caffeine (FIORICET, ESGIC) 50-325-40 MG tablet Take 1 tablet by mouth 3 (three) times daily as needed for headache.  01/17/17   [provider]  Cholecalciferol (VITAMIN D3) 2000 units TABS Take 1 tablet by mouth daily.    [provider]  diazepam (VALIUM) 10 MG tablet Take 10 mg by mouth at bedtime as needed for sleep (take 1-2  tabs). 12/24/04   [provider]  FLUoxetine (PROZAC) 20 MG capsule TAKE 4 CAPSULES BY MOUTH EVERY DAY 02/24/18   Cottle, Billey Co., MD  FLUoxetine (PROZAC) 20 MG tablet Take 80 mg by mouth daily. 4 tabs 06/21/00   [provider]  MAGNESIUM PO Take 400 mg by mouth daily.     [provider]  Multiple Vitamin (MULTIVITAMIN) tablet Take 1 tablet by mouth daily.    [provider]  potassium chloride (MICRO-K) 10 MEQ CR capsule Take 20 mEq by mouth 2 (two) times daily. 12/13/16   [provider]  topiramate (TOPAMAX) 50 MG tablet TAKE 3 TABLETS BY MOUTH EVERY DAY 04/03/18   Cottle, Billey Co., MD    Family History Family History  Problem Relation Age of Onset  . Pernicious anemia Maternal Grandfather   . Heart attack Maternal Grandfather   . Atrial fibrillation Mother   . Supraventricular tachycardia Father   . Heart disease Paternal Grandfather     Social History Social History   Tobacco Use  . Smoking status: Former Smoker    Years: 2.00    Start date: 06/25/1968    Last attempt to quit: 05/16/2014    Years since quitting: 4.0  . Smokeless tobacco: Never Used  Substance Use Topics  . Alcohol use: Yes    Alcohol/week: 1.0 standard drinks    Types: 1 Glasses of wine per week    Comment: occ  . Drug use: No     Allergies   Sulfa antibiotics   Review of Systems Review of Systems  All other systems reviewed and are negative.    Physical Exam Updated Vital Signs BP (!) 156/81 (BP Location: Right Arm)   Pulse 83   Temp 98.3 F (36.8 C) (Oral)   Resp 20   Ht 1.727 m (5' 8" )   Wt 59 kg   SpO2 100%   BMI 19.77 kg/m   Physical Exam Vitals signs and nursing note reviewed.  Constitutional:      General: She is not in acute distress.    Appearance: Normal appearance. She is well-developed. She is not toxic-appearing.  HENT:     Head: Normocephalic and atraumatic.  Eyes:     General: Lids are normal.      Conjunctiva/sclera: Conjunctivae normal.     Pupils: Pupils are equal, round, and reactive to light.  Neck:     Musculoskeletal: Normal range of motion and neck supple.     Thyroid: No thyroid mass.     Trachea: No tracheal deviation.  Cardiovascular:     Rate and Rhythm: Normal rate and regular rhythm.     Heart sounds: Normal heart sounds. No murmur. No gallop.   Pulmonary:     Effort: Pulmonary effort is normal. No respiratory distress.     Breath sounds: Normal breath sounds. No stridor. No decreased breath sounds, wheezing,  rhonchi or rales.  Abdominal:     General: Bowel sounds are normal. There is no distension.     Palpations: Abdomen is soft.     Tenderness: There is no abdominal tenderness. There is no rebound.  Musculoskeletal: Normal range of motion.        General: No tenderness.       Legs:  Skin:    General: Skin is warm and dry.     Findings: No abrasion or rash.  Neurological:     Mental Status: She is alert and oriented to person, place, and time.     GCS: GCS eye subscore is 4. GCS verbal subscore is 5. GCS motor subscore is 6.     Cranial Nerves: No cranial nerve deficit.     Sensory: No sensory deficit.  Psychiatric:        Speech: Speech normal.        Behavior: Behavior normal.      ED Treatments / Results  Labs (all labs ordered are listed, but only abnormal results are displayed) Labs Reviewed  LIPASE, BLOOD  COMPREHENSIVE METABOLIC PANEL  CBC  URINALYSIS, ROUTINE W REFLEX MICROSCOPIC    EKG None  Radiology No results found.  Procedures Procedures (including critical care time)  Medications Ordered in ED Medications  sodium chloride flush (NS) 0.9 % injection 3 mL (has no administration in time range)  sodium chloride 0.9 % bolus 1,000 mL (has no administration in time range)  0.9 %  sodium chloride infusion (has no administration in time range)  metoCLOPramide (REGLAN) injection 5 mg (has no administration in time range)      Initial Impression / Assessment and Plan / ED Course  I have reviewed the triage vital signs and the nursing notes.  Pertinent labs & imaging results that were available during my care of the patient were reviewed by me and considered in my medical decision making (see chart for details).        Patient's chest x-ray concerning for pneumonia.  Started on IV antibiotics.  Urinalysis noted with possible infection.  She has evidence of acute kidney injury with elevated creatinine.  She also has evidence of hypokalemia.  Treat with IV fluids as well as IV oral potassium.  COVID test negative.  Nausea has improved.  Will admit to the hospitalist service  CRITICAL CARE Performed by: Leota Jacobsen Total critical care time: 50 minutes Critical care time was exclusive of separately billable procedures and treating other patients. Critical care was necessary to treat or prevent imminent or life-threatening deterioration. Critical care was time spent personally by me on the following activities: development of treatment plan with patient and/or surrogate as well as nursing, discussions with consultants, evaluation of patient's response to treatment, examination of patient, obtaining history from patient or surrogate, ordering and performing treatments and interventions, ordering and review of laboratory studies, ordering and review of radiographic studies, pulse oximetry and re-evaluation of patient's condition.    [facility]ICE was evaluated in Emergency Department on 06/09/2018 for the symptoms described in the history of present illness. She was evaluated in the context of the global COVID-19 pandemic, which necessitated consideration that the patient might be at risk for infection with the SARS-CoV-2 virus that causes COVID-19. Institutional protocols and algorithms that pertain to the evaluation of patients at risk for COVID-19 are in a state of rapid change based on information released by  regulatory bodies including the CDC and federal and state organizations. These  policies and algorithms were followed during the patient's care in the ED.    Final Clinical Impressions(s) / ED Diagnoses   Final diagnoses:  Cough    ED Discharge Orders    None       Lacretia Leigh, MD 06/09/18 1438

## 2018-06-09 NOTE — ED Triage Notes (Signed)
Patient reports that she has been having a non productive cough x 1 week, N/V x 3 days. Patient states that she fell getting out of he tub 4 days ago and has bruising and slight swelling to the left knee and a laceration and bruising below the left knee.

## 2018-06-09 NOTE — H&P (Signed)
History and Physical    Dana Watkins WGN:[id number] DOB: 1955-04-08 DOA: 06/09/2018  PCP: Lawerance Cruel, MD Patient coming from: Home  Chief Complaint: Cough  HPI: Dana Watkins is a 63 y.o. female with medical history significant of hypertension, C. difficile, elevated alkaline phosphatase, anxiety, migraine, thrombocytopenia.  Patient reports a one-week history of persistent cough that is nonproductive.  She developed some nausea and vomiting about 3 days ago.  About 4 days ago she does note that she slipped and fell on on her tub lacerating her left knee.  She has no associated chest pain, fever, shortness of breath.  She is not taking anything for her symptoms.  ED Course: Vitals: Afebrile, normal pulse, normal respirations, blood pressures of 140s to 150s over 70s 80s, SPO2 of 98 to 100% on room air Labs: Potassium of 2.3, CO2 of 16, creatinine of 1.63, anion gap of 10, alkaline phosphatase of 225, AST of 81, total bilirubin of 4.4 Imaging: Chest x-ray concerning for possible right middle lobe infiltrate Medications/Course: Ceftriaxone, azithromycin, potassium, IV fluids  Review of Systems: Review of Systems  Constitutional: Negative for chills and fever.  Respiratory: Positive for cough and shortness of breath. Negative for sputum production.   Cardiovascular: Negative for chest pain.  Gastrointestinal: Positive for nausea and vomiting. Negative for abdominal pain, constipation and diarrhea.  All other systems reviewed and are negative.   Past Medical History:  Diagnosis Date  . Acute hypokalemia 11/26/2014  . Arthritis 05/15/2015  . Benign essential hypertension 05/15/2015  . C. difficile colitis   . CAD (coronary artery disease)    a. cath 04/2017: ""Diffuse, calcific CAD particularly in the distal RCA and proximal to mid LAD.  LAD disease is nonobstructive.  RCA disease is more severe but does not appear significant.  Given her lack of symptoms, would pursue medical  therapy."  . CKD (chronic kidney disease), stage III ([place])   . Hypokalemia   . Migraine headache 05/15/2015  . Mild mitral regurgitation   . Pancytopenia ([place])   . Sepsis ([place]) 01/2017  . Thrombocytopenia ([place])    a. chronic thrombocytopenia ([facility] - remotely saw hematology).  . Vitamin D deficiency     Past Surgical History:  Procedure Laterality Date  . CESAREAN SECTION     24 years ago  . LEFT HEART CATH AND CORONARY ANGIOGRAPHY N/A 05/12/2017   Procedure: LEFT HEART CATH AND CORONARY ANGIOGRAPHY;  Surgeon: [name] Booze, MD;  Location: [facility];  Service: Cardiovascular;  Laterality: N/A;  . tonsils and adneoids     as a child  . ULTRASOUND GUIDANCE FOR VASCULAR ACCESS  05/12/2017   Procedure: Ultrasound Guidance For Vascular Access;  Surgeon: [name] Booze, MD;  Location: [facility];  Service: Cardiovascular;;     reports that she quit smoking about 4 years ago. She started smoking about 49 years ago. She quit after 2.00 years of use. She has never used smokeless tobacco. She reports current alcohol use of about 1.0 standard drinks of alcohol per week. She reports that she does not use drugs.  Allergies  Allergen Reactions  . Sulfa Antibiotics Nausea And Vomiting and Rash    Family History  Problem Relation Age of Onset  . Pernicious anemia Maternal Grandfather   . Heart attack Maternal Grandfather   . Atrial fibrillation Mother   . Supraventricular tachycardia Father   . Heart disease Paternal Grandfather     Prior to Admission medications  Medication Sig Start Date End Date Taking? Authorizing Provider  Cholecalciferol (VITAMIN D3) 2000 units TABS Take 1 tablet by mouth daily.   Yes [provider]  MAGNESIUM PO Take 400 mg by mouth daily.    Yes [provider]  naproxen sodium (ALEVE) 220 MG tablet Take 220 mg by mouth 2 (two) times daily as needed (pain/headache).   Yes [provider]  polyvinyl alcohol  (LIQUIFILM TEARS) 1.4 % ophthalmic solution Place 1 drop into both eyes as needed for dry eyes.   Yes [provider]  potassium chloride (MICRO-K) 10 MEQ CR capsule Take 20 mEq by mouth 2 (two) times daily. 12/13/16  Yes [provider]  amLODipine (NORVASC) 2.5 MG tablet Take 1 tablet (2.5 mg total) by mouth daily. Patient not taking: Reported on 06/09/2018 05/25/17 08/23/17  Charlie Pitter, PA-C  atorvastatin (LIPITOR) 10 MG tablet Take 1 tablet (10 mg total) by mouth daily. Patient not taking: Reported on 06/09/2018 05/25/17 08/23/17  Charlie Pitter, PA-C  FLUoxetine (PROZAC) 20 MG capsule TAKE 4 CAPSULES BY MOUTH EVERY DAY Patient not taking: No sig reported 02/24/18   Cottle, Billey Co., MD  topiramate (TOPAMAX) 50 MG tablet TAKE 3 TABLETS BY MOUTH EVERY DAY Patient not taking: No sig reported 04/03/18   Cottle, Billey Co., MD    Physical Exam:  Physical Exam Constitutional:      General: She is not in acute distress.    Appearance: She is well-developed. She is not diaphoretic.  Eyes:     Conjunctiva/sclera: Conjunctivae normal.     Pupils: Pupils are equal, round, and reactive to light.  Neck:     Musculoskeletal: Normal range of motion.  Cardiovascular:     Rate and Rhythm: Normal rate and regular rhythm.     Heart sounds: Normal heart sounds. No murmur.  Pulmonary:     Effort: Pulmonary effort is normal. No respiratory distress.     Breath sounds: Normal breath sounds. No wheezing or rales.  Abdominal:     General: Bowel sounds are normal. There is no distension.     Palpations: Abdomen is soft.     Tenderness: There is no abdominal tenderness. There is no guarding or rebound.  Musculoskeletal: Normal range of motion.        General: No tenderness.  Lymphadenopathy:     Cervical: No cervical adenopathy.  Skin:    General: Skin is warm and dry.     Findings: Laceration (left knee and shin.) and wound (left knee and shin) present.  Neurological:     Mental  Status: She is alert and oriented to person, place, and time.      Labs on Admission: I have personally reviewed following labs and imaging studies  CBC: Recent Labs  Lab 06/09/18 1201  WBC 6.3  HGB 10.3*  HCT 31.1*  MCV 98.1  PLT 89*    Basic Metabolic Panel: Recent Labs  Lab 06/09/18 1201  NA 138  K 2.3*  CL 112*  CO2 16*  GLUCOSE 108*  BUN 17  CREATININE 1.64*  CALCIUM 10.3    GFR: Estimated Creatinine Clearance: 33.1 mL/min (A) (by C-G formula based on SCr of 1.64 mg/dL (H)).  Liver Function Tests: Recent Labs  Lab 06/09/18 1201  AST 81*  ALT 34  ALKPHOS 225*  BILITOT 4.4*  PROT 7.1  ALBUMIN 3.6   Recent Labs  Lab 06/09/18 1201  LIPASE 44   No results for input(s): AMMONIA  in the last 168 hours.  Coagulation Profile: No results for input(s): INR, PROTIME in the last 168 hours.  Cardiac Enzymes: No results for input(s): CKTOTAL, CKMB, CKMBINDEX, TROPONINI in the last 168 hours.  BNP (last 3 results) No results for input(s): PROBNP in the last 8760 hours.  HbA1C: No results for input(s): HGBA1C in the last 72 hours.  CBG: No results for input(s): GLUCAP in the last 168 hours.  Lipid Profile: No results for input(s): CHOL, HDL, LDLCALC, TRIG, CHOLHDL, LDLDIRECT in the last 72 hours.  Thyroid Function Tests: No results for input(s): TSH, T4TOTAL, FREET4, T3FREE, THYROIDAB in the last 72 hours.  Anemia Panel: No results for input(s): VITAMINB12, FOLATE, FERRITIN, TIBC, IRON, RETICCTPCT in the last 72 hours.  Urine analysis:    Component Value Date/Time   COLORURINE YELLOW 06/09/2018 1201   APPEARANCEUR HAZY (A) 06/09/2018 1201   LABSPEC 1.016 06/09/2018 1201   PHURINE 5.0 06/09/2018 1201   GLUCOSEU 50 (A) 06/09/2018 1201   HGBUR MODERATE (A) 06/09/2018 1201   [place] 06/09/2018 [place] 06/09/2018 1201   PROTEINUR 30 (A) 06/09/2018 1201   NITRITE NEGATIVE 06/09/2018 1201   LEUKOCYTESUR NEGATIVE  06/09/2018 1201     Radiological Exams on Admission: Dg Chest 2 View  Result Date: 06/09/2018 CLINICAL DATA:  Nonproductive cough. EXAM: CHEST - 2 VIEW COMPARISON:  Chest x-ray 02/02/2017. FINDINGS: Mediastinum and hilar structures normal. Mild right mid lung field infiltrate cannot be excluded. No pleural effusion or pneumothorax. Stable cardiomegaly. Diffuse thoracic spine osteopenia degenerative change. IMPRESSION: 1. Mild right mid lung field infiltrate cannot be excluded. 2.  Stable cardiomegaly. No pulmonary venous congestion. Electronically Signed   By: Marcello Moores  Register   On: 06/09/2018 13:08    Assessment/Plan Principal Problem:   Community acquired pneumonia of right middle lobe of lung ([place]) Active Problems:   Thrombocytopenia ([place])   Benign essential hypertension   AKI (acute kidney injury) ([place])    Community acquired pneumonia of right middle lobe Possible diagnosis. Started on empiric treatment. On room air -Continue Ceftriaxone/azithromycin -Blood cultures pending -Strep pneumo ag and legionella pending  Acute kidney injury on CKD stage III Baseline creatinine of 1.2. Creatinine of 1.64 on admission. In setting of emesis and possible dehydration. -Recommend discontinuing NSAID use -IV fluids  Essential hypertension Slightly hypertensive. Not on medication as an outpatient -Monitor  Elevated alkaline phosphatase Elevated AST Elevated bilirubin Patient has had persistently elevated levels dating back years. Bilirubin is above baseline, however. No abdominal pain. Patient has a liver MRI from 2015 that was remarkable for liver cysts -Fractionated bilirubin -AM CMP  Thrombocytopenia Chronic and stable. -SCDs  Knee laceration Secondary to fall. Too late for stitches. Does not look infected.   DVT prophylaxis: SCDs Code Status: Full code Family Communication: None Disposition Plan: Medical floor Consults called: None Admission status: Observation    Cordelia Poche, MD [facility] 06/09/2018, 5:31 PM  If 7PM-7AM, please contact night-coverage [email or web address] Password [email or web address]

## 2018-06-10 DIAGNOSIS — J181 Lobar pneumonia, unspecified organism: Secondary | ICD-10-CM | POA: Diagnosis not present

## 2018-06-10 LAB — CBC
HCT: 25.8 % — ABNORMAL LOW (ref 36.0–46.0)
Hemoglobin: 8.2 g/dL — ABNORMAL LOW (ref 12.0–15.0)
MCH: 32 pg (ref 26.0–34.0)
MCHC: 31.8 g/dL (ref 30.0–36.0)
MCV: 100.8 fL — ABNORMAL HIGH (ref 80.0–100.0)
Platelets: 67 10*3/uL — ABNORMAL LOW (ref 150–400)
RBC: 2.56 MIL/uL — ABNORMAL LOW (ref 3.87–5.11)
RDW: 16.5 % — ABNORMAL HIGH (ref 11.5–15.5)
WBC: 4.5 10*3/uL (ref 4.0–10.5)
nRBC: 0 % (ref 0.0–0.2)

## 2018-06-10 LAB — COMPREHENSIVE METABOLIC PANEL
ALT: 28 U/L (ref 0–44)
AST: 60 U/L — ABNORMAL HIGH (ref 15–41)
Albumin: 2.8 g/dL — ABNORMAL LOW (ref 3.5–5.0)
Alkaline Phosphatase: 154 U/L — ABNORMAL HIGH (ref 38–126)
Anion gap: 5 (ref 5–15)
BUN: 13 mg/dL (ref 8–23)
CO2: 16 mmol/L — ABNORMAL LOW (ref 22–32)
Calcium: 8.8 mg/dL — ABNORMAL LOW (ref 8.9–10.3)
Chloride: 119 mmol/L — ABNORMAL HIGH (ref 98–111)
Creatinine, Ser: 1.36 mg/dL — ABNORMAL HIGH (ref 0.44–1.00)
GFR calc Af Amer: 48 mL/min — ABNORMAL LOW (ref >60)
GFR calc non Af Amer: 42 mL/min — ABNORMAL LOW (ref >60)
Glucose, Bld: 89 mg/dL (ref 70–99)
Potassium: 2.8 mmol/L — ABNORMAL LOW (ref 3.5–5.1)
Sodium: 140 mmol/L (ref 135–145)
Total Bilirubin: 2.7 mg/dL — ABNORMAL HIGH (ref 0.3–1.2)
Total Protein: 5.5 g/dL — ABNORMAL LOW (ref 6.5–8.1)

## 2018-06-10 LAB — STREP PNEUMONIAE URINARY ANTIGEN: Strep Pneumo Urinary Antigen: NEGATIVE

## 2018-06-10 MED ORDER — CEFDINIR 300 MG PO CAPS: 300.0000 mg | ORAL_CAPSULE | Freq: Two times a day (BID) | ORAL | 0 refills | Status: AC

## 2018-06-10 NOTE — Discharge Instructions (Signed)
Community-Acquired Pneumonia, Adult  Pneumonia is an infection of the lungs. It causes swelling in the airways of the lungs. Mucus and fluid may also build up inside the airways.  One type of pneumonia can happen while a person is in a hospital. A different type can happen when a person is not in a hospital (community-acquired pneumonia).   What are the causes?    This condition is caused by germs (viruses, bacteria, or fungi). Some types of germs can be passed from one person to another. This can happen when you breathe in droplets from the cough or sneeze of an infected person.  What increases the risk?  You are more likely to develop this condition if you:   Have a long-term (chronic) disease, such as:  ? Chronic obstructive pulmonary disease (COPD).  ? Asthma.  ? Cystic fibrosis.  ? Congestive heart failure.  ? Diabetes.  ? Kidney disease.   Have HIV.   Have sickle cell disease.   Have had your spleen removed.   Do not take good care of your teeth and mouth (poor dental hygiene).   Have a medical condition that increases the risk of breathing in droplets from your own mouth and nose.   Have a weakened body defense system (immune system).   Are a smoker.   Travel to areas where the germs that cause this illness are common.   Are around certain animals or the places they live.  What are the signs or symptoms?   A dry cough.   A wet (productive) cough.   Fever.   Sweating.   Chest pain. This often happens when breathing deeply or coughing.   Fast breathing or trouble breathing.   Shortness of breath.   Shaking chills.   Feeling tired (fatigue).   Muscle aches.  How is this treated?  Treatment for this condition depends on many things. Most adults can be treated at home. In some cases, treatment must happen in a hospital. Treatment may include:   Medicines given by mouth or through an IV tube.   Being given extra oxygen.   Respiratory therapy.  In rare cases, treatment for very bad pneumonia  may include:   Using a machine to help you breathe.   Having a procedure to remove fluid from around your lungs.  Follow these instructions at home:  Medicines   Take over-the-counter and prescription medicines only as told by your doctor.  ? Only take cough medicine if you are losing sleep.   If you were prescribed an antibiotic medicine, take it as told by your doctor. Do not stop taking the antibiotic even if you start to feel better.  General instructions     Sleep with your head and neck raised (elevated). You can do this by sleeping in a recliner or by putting a few pillows under your head.   Rest as needed. Get at least 8 hours of sleep each night.   Drink enough water to keep your pee (urine) pale yellow.   Eat a healthy diet that includes plenty of vegetables, fruits, whole grains, low-fat dairy products, and lean protein.   Do not use any products that contain nicotine or tobacco. These include cigarettes, e-cigarettes, and chewing tobacco. If you need help quitting, ask your doctor.   Keep all follow-up visits as told by your doctor. This is important.  How is this prevented?  A shot (vaccine) can help prevent pneumonia. Shots are often suggested for:   People   older than 63 years of age.   People older than 63 years of age who:  ? Are having cancer treatment.  ? Have long-term (chronic) lung disease.  ? Have problems with their body's defense system.  You may also prevent pneumonia if you take these actions:   Get the flu (influenza) shot every year.   Go to the dentist as often as told.   Wash your hands often. If you cannot use soap and water, use hand sanitizer.  Contact a doctor if:   You have a fever.   You lose sleep because your cough medicine does not help.  Get help right away if:   You are short of breath and it gets worse.   You have more chest pain.   Your sickness gets worse. This is very serious if:  ? You are an older adult.  ? Your body's defense system is weak.   You  cough up blood.  Summary   Pneumonia is an infection of the lungs.   Most adults can be treated at home. Some will need treatment in a hospital.   Drink enough water to keep your pee pale yellow.   Get at least 8 hours of sleep each night.  This information is not intended to replace advice given to you by your health care provider. Make sure you discuss any questions you have with your health care provider.  Document Released: 06/16/2007 Document Revised: 08/25/2017 Document Reviewed: 08/25/2017  [employer]vier Interactive Patient Education  2019 [employer]

## 2018-06-10 NOTE — Discharge Summary (Signed)
Physician Discharge Summary  Dana Watkins WHQ:[id number] DOB: 29-Sep-1955 DOA: 06/09/2018  PCP: Lawerance Cruel, MD  Admit date: 06/09/2018 Discharge date: 06/10/2018  Admitted From: Home Disposition: Home  Recommendations for Outpatient Follow-up:  1. Follow up with PCP in 1-2 weeks 2. Please obtain BMP/CBC in one week 3. Please follow up on the following pending results:  Home Health: None Equipment/Devices: None  Discharge Condition: Stable CODE STATUS: Full code Diet recommendation: Heart healthy  Subjective: Seen and examined.  No complaints.  No nausea vomiting.  Tolerated regular diet.  Denies shortness of breath.  Brief/Interim Summary: 63 year old pleasant lady with a past medical history of CHF, hypertension, elevated alkaline phosphatase, anxiety, chronic thrombocytopenia came to the emergency department yesterday with nausea and vomiting as well as shortness of breath and she was eventually diagnosed with right lower lobe pneumonia.  She was admitted to hospital service and was started on IV Rocephin and Zithromax.  She also had mild acute on chronic kidney disease.  She was started on IV fluids, her renal function improved and back to her baseline.  When seen today, she feels much better.  No dyspnea or any nausea or vomiting and she is able to tolerate full regular diet.  She wants to go home and since she is stable so she will be discharged.  I have prescribed her 7 days of cefdinir p.o. and she will follow with PCP. Of note, she has some scratches and bruises on the left knee but no open sores, I advised her to buy Neosporin from pharmacy over-the-counter and apply twice a day.  Discharge Diagnoses:  Principal Problem:   Community acquired pneumonia of right middle lobe of lung ([place]) Active Problems:   Thrombocytopenia (HCC)   Nausea & vomiting   Benign essential hypertension   AKI (acute kidney injury) [facility])    Discharge Instructions  Discharge Instructions     Discharge patient   Complete by:  As directed    Discharge disposition:  01-Home or Self Care   Discharge patient date:  06/10/2018     Allergies as of 06/10/2018      Reactions   Sulfa Antibiotics Nausea And Vomiting, Rash      Medication List    TAKE these medications   amLODipine 2.5 MG tablet Commonly known as:  NORVASC Take 1 tablet (2.5 mg total) by mouth daily.   atorvastatin 10 MG tablet Commonly known as:  LIPITOR Take 1 tablet (10 mg total) by mouth daily.   cefdinir 300 MG capsule Commonly known as:  OMNICEF Take 1 capsule (300 mg total) by mouth 2 (two) times daily for 7 days.   FLUoxetine 20 MG capsule Commonly known as:  PROZAC TAKE 4 CAPSULES BY MOUTH EVERY DAY   MAGNESIUM PO Take 400 mg by mouth daily.   naproxen sodium 220 MG tablet Commonly known as:  ALEVE Take 220 mg by mouth 2 (two) times daily as needed (pain/headache).   polyvinyl alcohol 1.4 % ophthalmic solution Commonly known as:  LIQUIFILM TEARS Place 1 drop into both eyes as needed for dry eyes.   potassium chloride 10 MEQ CR capsule Commonly known as:  MICRO-K Take 20 mEq by mouth 2 (two) times daily.   topiramate 50 MG tablet Commonly known as:  TOPAMAX TAKE 3 TABLETS BY MOUTH EVERY DAY   Vitamin D3 50 MCG (2000 UT) Tabs Take 1 tablet by mouth daily.      Follow-up Information    Lawerance Cruel, MD  Follow up in 1 week(s).   Specialty:  Family Medicine Contact information: [place] [address] [phone]          Allergies  Allergen Reactions  . Sulfa Antibiotics Nausea And Vomiting and Rash    Consultations: None   Procedures/Studies: Dg Chest 2 View  Result Date: 06/09/2018 CLINICAL DATA:  Nonproductive cough. EXAM: CHEST - 2 VIEW COMPARISON:  Chest x-ray 02/02/2017. FINDINGS: Mediastinum and hilar structures normal. Mild right mid lung field infiltrate cannot be excluded. No pleural effusion or pneumothorax. Stable cardiomegaly.  Diffuse thoracic spine osteopenia degenerative change. IMPRESSION: 1. Mild right mid lung field infiltrate cannot be excluded. 2.  Stable cardiomegaly. No pulmonary venous congestion. Electronically Signed   By: Marcello Moores  Register   On: 06/09/2018 13:08      Discharge Exam: Vitals:   06/09/18 1839 06/10/18 0604  BP: 133/70 126/72  Pulse: 75 77  Resp: 18 16  Temp: 98.3 F (36.8 C) 98.2 F (36.8 C)  SpO2: 100% 98%   Vitals:   06/09/18 1500 06/09/18 1630 06/09/18 1839 06/10/18 0604  BP: (!) 142/81 136/77 133/70 126/72  Pulse: 80 75 75 77  Resp: 17 (!) 23 18 16   Temp:   98.3 F (36.8 C) 98.2 F (36.8 C)  TempSrc:   Oral Oral  SpO2: 98% 100% 100% 98%  Weight:      Height:        General: Pt is alert, awake, not in acute distress Cardiovascular: RRR, S1/S2 +, no rubs, no gallops Respiratory: CTA bilaterally, no wheezing, no rhonchi Abdominal: Soft, NT, ND, bowel sounds + Extremities: no edema, no cyanosis, has bruise at the left shoulder area and multiple bruises of the left knee with no open sores.  All are healing well with no signs of infection.    The results of significant diagnostics from this hospitalization (including imaging, microbiology, ancillary and laboratory) are listed below for reference.     Microbiology: Recent Results (from the past 240 hour(s))  SARS Coronavirus 2 (CEPHEID - Performed in [place] hospital lab), Hosp Order     Status: None   Collection Time: 06/09/18  1:27 PM  Result Value Ref Range Status   SARS Coronavirus 2 NEGATIVE NEGATIVE Final    Comment: (NOTE) If result is NEGATIVE SARS-CoV-2 target nucleic acids are NOT DETECTED. The SARS-CoV-2 RNA is generally detectable in upper and lower  respiratory specimens during the acute phase of infection. The lowest  concentration of SARS-CoV-2 viral copies this assay can detect is 250  copies / mL. A negative result does not preclude SARS-CoV-2 infection  and should not be used as the sole  basis for treatment or other  patient management decisions.  A negative result may occur with  improper specimen collection / handling, submission of specimen other  than nasopharyngeal swab, presence of viral mutation(s) within the  areas targeted by this assay, and inadequate number of viral copies  (<250 copies / mL). A negative result must be combined with clinical  observations, patient history, and epidemiological information. If result is POSITIVE SARS-CoV-2 target nucleic acids are DETECTED. The SARS-CoV-2 RNA is generally detectable in upper and lower  respiratory specimens dur ing the acute phase of infection.  Positive  results are indicative of active infection with SARS-CoV-2.  Clinical  correlation with patient history and other diagnostic information is  necessary to determine patient infection status.  Positive results do  not rule out bacterial infection or co-infection with other  viruses. If result is PRESUMPTIVE POSTIVE SARS-CoV-2 nucleic acids MAY BE PRESENT.   A presumptive positive result was obtained on the submitted specimen  and confirmed on repeat testing.  While 2019 novel coronavirus  (SARS-CoV-2) nucleic acids may be present in the submitted sample  additional confirmatory testing may be necessary for epidemiological  and / or clinical management purposes  to differentiate between  SARS-CoV-2 and other Sarbecovirus currently known to infect humans.  If clinically indicated additional testing with an alternate test  methodology [phone]) is advised. The SARS-CoV-2 RNA is generally  detectable in upper and lower respiratory sp ecimens during the acute  phase of infection. The expected result is Negative. Fact Sheet for Patients:  StrictlyIdeas.no Fact Sheet for Healthcare Providers: BankingDealers.co.za This test is not yet approved or cleared by the [place] FDA and has been authorized for detection  and/or diagnosis of SARS-CoV-2 by FDA under an Emergency Use Authorization (EUA).  This EUA will remain in effect (meaning this test can be used) for the duration of the COVID-19 declaration under Section 564(b)(1) of the Act, 21 U.S.C. section 360bbb-3(b)(1), unless the authorization is terminated or revoked sooner. Performed at [facility], [place] [address], [place],  [address]      Labs: BNP (last 3 results) No results for input(s): BNP in the last 8760 hours. Basic Metabolic Panel: Recent Labs  Lab 06/09/18 1201 06/10/18 0632  NA 138 140  K 2.3* 2.8*  CL 112* 119*  CO2 16* 16*  GLUCOSE 108* 89  BUN 17 13  CREATININE 1.64* 1.36*  CALCIUM 10.3 8.8*   Liver Function Tests: Recent Labs  Lab 06/09/18 1201 06/10/18 0632  AST 81* 60*  ALT 34 28  ALKPHOS 225* 154*  BILITOT 4.1*  4.4* 2.7*  PROT 7.1 5.5*  ALBUMIN 3.6 2.8*   Recent Labs  Lab 06/09/18 1201  LIPASE 44   No results for input(s): AMMONIA in the last 168 hours. CBC: Recent Labs  Lab 06/09/18 1201 06/10/18 0632  WBC 6.3 4.5  HGB 10.3* 8.2*  HCT 31.1* 25.8*  MCV 98.1 100.8*  PLT 89* 67*   Cardiac Enzymes: No results for input(s): CKTOTAL, CKMB, CKMBINDEX, TROPONINI in the last 168 hours. BNP: Invalid input(s): POCBNP CBG: No results for input(s): GLUCAP in the last 168 hours. D-Dimer No results for input(s): DDIMER in the last 72 hours. Hgb A1c No results for input(s): HGBA1C in the last 72 hours. Lipid Profile No results for input(s): CHOL, HDL, LDLCALC, TRIG, CHOLHDL, LDLDIRECT in the last 72 hours. Thyroid function studies No results for input(s): TSH, T4TOTAL, T3FREE, THYROIDAB in the last 72 hours.  Invalid input(s): FREET3 Anemia work up No results for input(s): VITAMINB12, FOLATE, FERRITIN, TIBC, IRON, RETICCTPCT in the last 72 hours. Urinalysis    Component Value Date/Time   COLORURINE YELLOW 06/09/2018 1201   APPEARANCEUR HAZY (A) 06/09/2018 1201    LABSPEC 1.016 06/09/2018 1201   PHURINE 5.0 06/09/2018 1201   GLUCOSEU 50 (A) 06/09/2018 1201   HGBUR MODERATE (A) 06/09/2018 1201   [place] 06/09/2018 [place] 06/09/2018 1201   PROTEINUR 30 (A) 06/09/2018 1201   NITRITE NEGATIVE 06/09/2018 1201   [place] 06/09/2018 1201   Sepsis Labs Invalid input(s): PROCALCITONIN,  WBC,  LACTICIDVEN Microbiology Recent Results (from the past 240 hour(s))  SARS Coronavirus 2 (CEPHEID - Performed in [place] hospital lab), Hosp Order     Status: None   Collection Time: 06/09/18  1:27  PM  Result Value Ref Range Status   SARS Coronavirus 2 NEGATIVE NEGATIVE Final    Comment: (NOTE) If result is NEGATIVE SARS-CoV-2 target nucleic acids are NOT DETECTED. The SARS-CoV-2 RNA is generally detectable in upper and lower  respiratory specimens during the acute phase of infection. The lowest  concentration of SARS-CoV-2 viral copies this assay can detect is 250  copies / mL. A negative result does not preclude SARS-CoV-2 infection  and should not be used as the sole basis for treatment or other  patient management decisions.  A negative result may occur with  improper specimen collection / handling, submission of specimen other  than nasopharyngeal swab, presence of viral mutation(s) within the  areas targeted by this assay, and inadequate number of viral copies  (<250 copies / mL). A negative result must be combined with clinical  observations, patient history, and epidemiological information. If result is POSITIVE SARS-CoV-2 target nucleic acids are DETECTED. The SARS-CoV-2 RNA is generally detectable in upper and lower  respiratory specimens dur ing the acute phase of infection.  Positive  results are indicative of active infection with SARS-CoV-2.  Clinical  correlation with patient history and other diagnostic information is  necessary to determine patient infection status.  Positive results do  not  rule out bacterial infection or co-infection with other viruses. If result is PRESUMPTIVE POSTIVE SARS-CoV-2 nucleic acids MAY BE PRESENT.   A presumptive positive result was obtained on the submitted specimen  and confirmed on repeat testing.  While 2019 novel coronavirus  (SARS-CoV-2) nucleic acids may be present in the submitted sample  additional confirmatory testing may be necessary for epidemiological  and / or clinical management purposes  to differentiate between  SARS-CoV-2 and other Sarbecovirus currently known to infect humans.  If clinically indicated additional testing with an alternate test  methodology [phone]) is advised. The SARS-CoV-2 RNA is generally  detectable in upper and lower respiratory sp ecimens during the acute  phase of infection. The expected result is Negative. Fact Sheet for Patients:  StrictlyIdeas.no Fact Sheet for Healthcare Providers: BankingDealers.co.za This test is not yet approved or cleared by the [place] FDA and has been authorized for detection and/or diagnosis of SARS-CoV-2 by FDA under an Emergency Use Authorization (EUA).  This EUA will remain in effect (meaning this test can be used) for the duration of the COVID-19 declaration under Section 564(b)(1) of the Act, 21 U.S.C. section 360bbb-3(b)(1), unless the authorization is terminated or revoked sooner. Performed at [facility], [place] [address] [place], [place] [address]      Time coordinating discharge: Over 30 minutes  SIGNED:   Darliss Cheney, MD  [facility] 06/10/2018, 11:02 AM Pager [phone]  If 7PM-7AM, please contact night-coverage [email or web address] Password [email or web address]

## 2018-06-11 LAB — HIV ANTIBODY (ROUTINE TESTING W REFLEX): HIV Screen 4th Generation wRfx: NONREACTIVE

## 2018-06-12 LAB — LEGIONELLA PNEUMOPHILA SEROGP 1 UR AG: L. pneumophila Serogp 1 Ur Ag: NEGATIVE

## 2018-06-14 LAB — CULTURE, BLOOD (ROUTINE X 2)
Culture: NO GROWTH
Culture: NO GROWTH
Special Requests: ADEQUATE
Special Requests: ADEQUATE

## 2018-06-25 ENCOUNTER — Emergency Department (HOSPITAL_COMMUNITY): Payer: BLUE CROSS/BLUE SHIELD

## 2018-06-25 ENCOUNTER — Other Ambulatory Visit: Payer: Self-pay

## 2018-06-25 ENCOUNTER — Inpatient Hospital Stay (HOSPITAL_COMMUNITY)
Admission: EM | Admit: 2018-06-25 | Discharge: 2018-06-28 | DRG: 872 | Disposition: A | Discharge status: Home / Self Care | Payer: BLUE CROSS/BLUE SHIELD | Attending: Internal Medicine | Admitting: Internal Medicine

## 2018-06-25 DIAGNOSIS — D539 Nutritional anemia, unspecified: Secondary | ICD-10-CM | POA: Diagnosis present

## 2018-06-25 DIAGNOSIS — I129 Hypertensive chronic kidney disease with stage 1 through stage 4 chronic kidney disease, or unspecified chronic kidney disease: Secondary | ICD-10-CM | POA: Diagnosis present

## 2018-06-25 DIAGNOSIS — R112 Nausea with vomiting, unspecified: Secondary | ICD-10-CM | POA: Diagnosis present

## 2018-06-25 DIAGNOSIS — F411 Generalized anxiety disorder: Secondary | ICD-10-CM | POA: Diagnosis present

## 2018-06-25 DIAGNOSIS — R945 Abnormal results of liver function studies: Secondary | ICD-10-CM | POA: Diagnosis present

## 2018-06-25 DIAGNOSIS — Z9181 History of falling: Secondary | ICD-10-CM | POA: Diagnosis not present

## 2018-06-25 DIAGNOSIS — N179 Acute kidney failure, unspecified: Secondary | ICD-10-CM | POA: Diagnosis present

## 2018-06-25 DIAGNOSIS — R7989 Other specified abnormal findings of blood chemistry: Secondary | ICD-10-CM

## 2018-06-25 DIAGNOSIS — Z20828 Contact with and (suspected) exposure to other viral communicable diseases: Secondary | ICD-10-CM | POA: Diagnosis present

## 2018-06-25 DIAGNOSIS — N183 Chronic kidney disease, stage 3 (moderate): Secondary | ICD-10-CM | POA: Diagnosis present

## 2018-06-25 DIAGNOSIS — D61818 Other pancytopenia: Secondary | ICD-10-CM | POA: Diagnosis present

## 2018-06-25 DIAGNOSIS — Z8619 Personal history of other infectious and parasitic diseases: Secondary | ICD-10-CM | POA: Diagnosis not present

## 2018-06-25 DIAGNOSIS — D72819 Decreased white blood cell count, unspecified: Secondary | ICD-10-CM

## 2018-06-25 DIAGNOSIS — A419 Sepsis, unspecified organism: Principal | ICD-10-CM | POA: Diagnosis present

## 2018-06-25 DIAGNOSIS — I10 Essential (primary) hypertension: Secondary | ICD-10-CM | POA: Diagnosis present

## 2018-06-25 DIAGNOSIS — Z8249 Family history of ischemic heart disease and other diseases of the circulatory system: Secondary | ICD-10-CM | POA: Diagnosis not present

## 2018-06-25 DIAGNOSIS — N390 Urinary tract infection, site not specified: Secondary | ICD-10-CM | POA: Diagnosis present

## 2018-06-25 DIAGNOSIS — N3000 Acute cystitis without hematuria: Secondary | ICD-10-CM | POA: Diagnosis present

## 2018-06-25 DIAGNOSIS — Z79899 Other long term (current) drug therapy: Secondary | ICD-10-CM

## 2018-06-25 DIAGNOSIS — K7460 Unspecified cirrhosis of liver: Secondary | ICD-10-CM | POA: Diagnosis present

## 2018-06-25 DIAGNOSIS — N10 Acute tubulo-interstitial nephritis: Secondary | ICD-10-CM | POA: Diagnosis not present

## 2018-06-25 DIAGNOSIS — R6520 Severe sepsis without septic shock: Secondary | ICD-10-CM | POA: Diagnosis not present

## 2018-06-25 DIAGNOSIS — I2510 Atherosclerotic heart disease of native coronary artery without angina pectoris: Secondary | ICD-10-CM | POA: Diagnosis present

## 2018-06-25 DIAGNOSIS — E872 Acidosis: Secondary | ICD-10-CM | POA: Diagnosis present

## 2018-06-25 LAB — COMPREHENSIVE METABOLIC PANEL
ALT: 27 U/L (ref 0–44)
AST: 63 U/L — ABNORMAL HIGH (ref 15–41)
Albumin: 3.1 g/dL — ABNORMAL LOW (ref 3.5–5.0)
Alkaline Phosphatase: 169 U/L — ABNORMAL HIGH (ref 38–126)
Anion gap: 7 (ref 5–15)
BUN: 14 mg/dL (ref 8–23)
CO2: 17 mmol/L — ABNORMAL LOW (ref 22–32)
Calcium: 9.5 mg/dL (ref 8.9–10.3)
Chloride: 116 mmol/L — ABNORMAL HIGH (ref 98–111)
Creatinine, Ser: 1.38 mg/dL — ABNORMAL HIGH (ref 0.44–1.00)
GFR calc Af Amer: 47 mL/min — ABNORMAL LOW (ref >60)
GFR calc non Af Amer: 41 mL/min — ABNORMAL LOW (ref >60)
Glucose, Bld: 106 mg/dL — ABNORMAL HIGH (ref 70–99)
Potassium: 3.5 mmol/L (ref 3.5–5.1)
Sodium: 140 mmol/L (ref 135–145)
Total Bilirubin: 3.6 mg/dL — ABNORMAL HIGH (ref 0.3–1.2)
Total Protein: 6.7 g/dL (ref 6.5–8.1)

## 2018-06-25 LAB — LACTIC ACID, PLASMA
Lactic Acid, Venous: 1.5 mmol/L (ref 0.5–1.9)
Lactic Acid, Venous: 1.2 mmol/L (ref 0.5–1.9)

## 2018-06-25 LAB — CBC WITH DIFFERENTIAL/PLATELET
Abs Immature Granulocytes: 0.03 10*3/uL (ref 0.00–0.07)
Basophils Absolute: 0 10*3/uL (ref 0.0–0.1)
Basophils Relative: 0 %
Eosinophils Absolute: 0 10*3/uL (ref 0.0–0.5)
Eosinophils Relative: 0 %
HCT: 30.4 % — ABNORMAL LOW (ref 36.0–46.0)
Hemoglobin: 9.7 g/dL — ABNORMAL LOW (ref 12.0–15.0)
Immature Granulocytes: 0 %
Lymphocytes Relative: 6 %
Lymphs Abs: 0.7 10*3/uL (ref 0.7–4.0)
MCH: 32.1 pg (ref 26.0–34.0)
MCHC: 31.9 g/dL (ref 30.0–36.0)
MCV: 100.7 fL — ABNORMAL HIGH (ref 80.0–100.0)
Monocytes Absolute: 0.8 10*3/uL (ref 0.1–1.0)
Monocytes Relative: 7 %
Neutro Abs: 9.5 10*3/uL — ABNORMAL HIGH (ref 1.7–7.7)
Neutrophils Relative %: 87 %
Platelets: 87 10*3/uL — ABNORMAL LOW (ref 150–400)
RBC: 3.02 MIL/uL — ABNORMAL LOW (ref 3.87–5.11)
RDW: 16 % — ABNORMAL HIGH (ref 11.5–15.5)
WBC: 11 10*3/uL — ABNORMAL HIGH (ref 4.0–10.5)
nRBC: 0 % (ref 0.0–0.2)

## 2018-06-25 LAB — URINALYSIS, ROUTINE W REFLEX MICROSCOPIC
Bilirubin Urine: NEGATIVE
Glucose, UA: 50 mg/dL — AB
Hgb urine dipstick: NEGATIVE
Ketones, ur: NEGATIVE mg/dL
Nitrite: POSITIVE — AB
Protein, ur: 30 mg/dL — AB
Specific Gravity, Urine: 1.015 (ref 1.005–1.030)
pH: 6 (ref 5.0–8.0)

## 2018-06-25 LAB — SARS CORONAVIRUS 2 BY RT PCR (HOSPITAL ORDER, PERFORMED IN ~~LOC~~ HOSPITAL LAB): SARS Coronavirus 2: NEGATIVE

## 2018-06-25 LAB — AMMONIA: Ammonia: 42 umol/L — ABNORMAL HIGH (ref 9–35)

## 2018-06-25 LAB — RAPID URINE DRUG SCREEN, HOSP PERFORMED
Amphetamines: NOT DETECTED
Barbiturates: NOT DETECTED
Benzodiazepines: POSITIVE — AB
Cocaine: NOT DETECTED
Opiates: NOT DETECTED
Tetrahydrocannabinol: NOT DETECTED

## 2018-06-25 LAB — TROPONIN I: Troponin I: 0.03 ng/mL (ref <0.03)

## 2018-06-25 LAB — LIPASE, BLOOD: Lipase: 36 U/L (ref 11–51)

## 2018-06-25 LAB — ETHANOL: Alcohol, Ethyl (B): <10 mg/dL (ref <10)

## 2018-06-25 MED ORDER — ENOXAPARIN SODIUM 40 MG/0.4ML ~~LOC~~ SOLN
40.0000 mg | SUBCUTANEOUS | Status: DC
  Administered 2018-06-25: 40 mg via SUBCUTANEOUS
  Filled 2018-06-25: qty 0.4

## 2018-06-25 MED ORDER — POLYVINYL ALCOHOL 1.4 % OP SOLN: 1.0000 [drp] | OPHTHALMIC | Status: DC | PRN

## 2018-06-25 MED ORDER — METRONIDAZOLE IN NACL 5-0.79 MG/ML-% IV SOLN
500.0000 mg | Freq: Once | INTRAVENOUS | Status: AC
  Administered 2018-06-25: 500 mg via INTRAVENOUS
  Filled 2018-06-25: qty 100

## 2018-06-25 MED ORDER — ONDANSETRON HCL 4 MG/2ML IJ SOLN: 4.0000 mg | Freq: Four times a day (QID) | INTRAMUSCULAR | Status: DC | PRN

## 2018-06-25 MED ORDER — SODIUM CHLORIDE 0.9 % IV SOLN
2.0000 g | Freq: Once | INTRAVENOUS | Status: AC
  Administered 2018-06-25: 2 g via INTRAVENOUS
  Filled 2018-06-25: qty 2

## 2018-06-25 MED ORDER — ACETAMINOPHEN 325 MG PO TABS
650.0000 mg | ORAL_TABLET | Freq: Once | ORAL | Status: AC
  Administered 2018-06-25: 650 mg via ORAL
  Filled 2018-06-25: qty 2

## 2018-06-25 MED ORDER — VITAMIN D3 25 MCG (1000 UNIT) PO TABS
1000.0000 [IU] | ORAL_TABLET | Freq: Every day | ORAL | Status: DC
  Administered 2018-06-25 – 2018-06-28 (×4): 1000 [IU] via ORAL
  Filled 2018-06-25 (×5): qty 1

## 2018-06-25 MED ORDER — DEXTROSE-NACL 5-0.9 % IV SOLN
INTRAVENOUS | Status: DC
  Administered 2018-06-25 – 2018-06-27 (×3): via INTRAVENOUS

## 2018-06-25 MED ORDER — POTASSIUM CHLORIDE CRYS ER 20 MEQ PO TBCR
40.0000 meq | EXTENDED_RELEASE_TABLET | Freq: Two times a day (BID) | ORAL | Status: DC
  Administered 2018-06-25 – 2018-06-28 (×6): 40 meq via ORAL
  Filled 2018-06-25 (×6): qty 2

## 2018-06-25 MED ORDER — SODIUM CHLORIDE 0.9 % IV SOLN
1.0000 g | INTRAVENOUS | Status: AC
  Administered 2018-06-25 – 2018-06-27 (×3): 1 g via INTRAVENOUS
  Filled 2018-06-25 (×3): qty 1

## 2018-06-25 MED ORDER — VANCOMYCIN HCL IN DEXTROSE 1-5 GM/200ML-% IV SOLN
1000.0000 mg | Freq: Once | INTRAVENOUS | Status: AC
  Administered 2018-06-25: 1000 mg via INTRAVENOUS
  Filled 2018-06-25: qty 200

## 2018-06-25 MED ORDER — ONDANSETRON HCL 4 MG PO TABS: 4.0000 mg | ORAL_TABLET | Freq: Four times a day (QID) | ORAL | Status: DC | PRN

## 2018-06-25 MED ORDER — SODIUM CHLORIDE 0.9 % IV SOLN
1000.0000 mL | INTRAVENOUS | Status: DC
  Administered 2018-06-25: 1000 mL via INTRAVENOUS

## 2018-06-25 NOTE — ED Notes (Signed)
ED TO INPATIENT HANDOFF REPORT  Name/Age/Gender Dana Watkins 63 y.o. female  Code Status Code Status History    Date Active Date Inactive Code Status Order ID Comments User Context   06/09/2018 1825 06/10/2018 1454 Full Code [id number]  Mariel Aloe, MD Inpatient   05/12/2017 1219 05/12/2017 1927 Full Code [id number]  [name] Booze, MD Inpatient   02/02/2017 2320 02/06/2017 2010 Full Code [id number]  Etta Quill, DO ED   Advance Care Planning Activity      Home/SNF/Other Home  Chief Complaint Weakness, Nausea  Level of Care/Admitting Diagnosis ED Disposition    ED Disposition Condition [facility] Area: [place] [[id number]]  Level of Care: Telemetry [5]  Admit to tele based on following criteria: Other see comments  Comments: sepsis  Covid Evaluation: Confirmed COVID Negative  Diagnosis: Sepsis ([place]) [[id number]]  Admitting Physician: Elwyn Reach [[id number]]  Attending Physician: Elwyn Reach [[id number]]  Estimated length of stay: past midnight tomorrow  Certification:: I certify this patient will need inpatient services for at least 2 midnights  PT Class (Do Not Modify): Inpatient [101]  PT Acc Code (Do Not Modify): Private [1]       Medical History Past Medical History:  Diagnosis Date  . Acute hypokalemia 11/26/2014  . Arthritis 05/15/2015  . Benign essential hypertension 05/15/2015  . C. difficile colitis   . CAD (coronary artery disease)    a. cath 04/2017: ""Diffuse, calcific CAD particularly in the distal RCA and proximal to mid LAD.  LAD disease is nonobstructive.  RCA disease is more severe but does not appear significant.  Given her lack of symptoms, would pursue medical therapy."  . CKD (chronic kidney disease), stage III ([place])   . Hypokalemia   . Migraine headache 05/15/2015  . Mild mitral regurgitation   . Pancytopenia ([place])   . Sepsis ([place]) 01/2017  . Thrombocytopenia ([place])    a. chronic thrombocytopenia (IT[facility] -  remotely saw hematology).  . Vitamin D deficiency     Allergies Allergies  Allergen Reactions  . Sulfa Antibiotics Nausea And Vomiting and Rash    IV Location/Drains/Wounds Patient Lines/Drains/Airways Status   Active Line/Drains/Airways    Name:   Placement date:   Placement time:   Site:   Days:   Peripheral IV 06/25/18 Right Antecubital   06/25/18    1309    Antecubital   less than 1          Labs/Imaging Results for orders placed or performed during the hospital encounter of 06/25/18 (from the past 48 hour(s))  Comprehensive metabolic panel     Status: Abnormal   Collection Time: 06/25/18  1:15 PM  Result Value Ref Range   Sodium 140 135 - 145 mmol/L   Potassium 3.5 3.5 - 5.1 mmol/L   Chloride 116 (H) 98 - 111 mmol/L   CO2 17 (L) 22 - 32 mmol/L   Glucose, Bld 106 (H) 70 - 99 mg/dL   BUN 14 8 - 23 mg/dL   Creatinine, Ser 1.38 (H) 0.44 - 1.00 mg/dL   Calcium 9.5 8.9 - 10.3 mg/dL   Total Protein 6.7 6.5 - 8.1 g/dL   Albumin 3.1 (L) 3.5 - 5.0 g/dL   AST 63 (H) 15 - 41 U/L   ALT 27 0 - 44 U/L   Alkaline Phosphatase 169 (H) 38 - 126 U/L   Total Bilirubin 3.6 (H) 0.3 - 1.2 mg/dL   GFR calc non Af  Amer 41 (L) >60 mL/min   GFR calc Af Amer 47 (L) >60 mL/min   Anion gap 7 5 - 15    Comment: Performed at [facility], [place] [address] [place], [place] [address]  CBC WITH DIFFERENTIAL     Status: Abnormal   Collection Time: 06/25/18  1:15 PM  Result Value Ref Range   WBC 11.0 (H) 4.0 - 10.5 K/uL   RBC 3.02 (L) 3.87 - 5.11 MIL/uL   Hemoglobin 9.7 (L) 12.0 - 15.0 g/dL   HCT 30.4 (L) 36.0 - 46.0 %   MCV 100.7 (H) 80.0 - 100.0 fL   MCH 32.1 26.0 - 34.0 pg   MCHC 31.9 30.0 - 36.0 g/dL   RDW 16.0 (H) 11.5 - 15.5 %   Platelets 87 (L) 150 - 400 K/uL    Comment: REPEATED TO VERIFY PLATELET COUNT CONFIRMED BY SMEAR Immature Platelet Fraction may be clinically indicated, consider ordering this additional test [id number] CONSISTENT WITH PREVIOUS RESULT     nRBC 0.0 0.0 - 0.2 %   Neutrophils Relative % 87 %   Neutro Abs 9.5 (H) 1.7 - 7.7 K/uL   Lymphocytes Relative 6 %   Lymphs Abs 0.7 0.7 - 4.0 K/uL   Monocytes Relative 7 %   Monocytes Absolute 0.8 0.1 - 1.0 K/uL   Eosinophils Relative 0 %   Eosinophils Absolute 0.0 0.0 - 0.5 K/uL   Basophils Relative 0 %   Basophils Absolute 0.0 0.0 - 0.1 K/uL   Immature Granulocytes 0 %   Abs Immature Granulocytes 0.03 0.00 - 0.07 K/uL    Comment: Performed at [facility], [place] [address] [place], [place] [address]  Blood Culture (routine x 2)     Status: None (Preliminary result)   Collection Time: 06/25/18  1:15 PM   Specimen: BLOOD  Result Value Ref Range   Specimen Description      BLOOD BLOOD RIGHT FOREARM Performed at [facility] [address] [place], [place] [address]    Special Requests      BOTTLES DRAWN AEROBIC AND ANAEROBIC Blood Culture adequate volume Performed at [facility] [address], [place], [place] [address]    Culture      NO GROWTH < 12 HOURS Performed at [facility] [address] [place], [place] [address]    Report Status PENDING   Lipase, blood     Status: None   Collection Time: 06/25/18  1:15 PM  Result Value Ref Range   Lipase 36 11 - 51 U/L    Comment: Performed at [facility], [place] [address], [place], [place] [address]  Troponin I - Once     Status: Abnormal   Collection Time: 06/25/18  1:15 PM  Result Value Ref Range   Troponin I 0.03 (HH) <0.03 ng/mL    Comment: CRITICAL RESULT CALLED TO, READ BACK BY AND VERIFIED WITH: T SMITH,RN 06/25/18 1507 RHOLMES Performed at [facility], [place] [address], [place], [place] [address]   Lactic acid, plasma     Status: None   Collection Time: 06/25/18  2:29 PM  Result Value Ref Range   Lactic Acid, Venous 1.5 0.5 - 1.9 mmol/L    Comment: Performed at [facility], [place] [address]  [place], [place] [address]  SARS Coronavirus 2 (CEPHEID - Performed in [place] hospital lab), [facility] Order     Status: None   Collection  Time: 06/25/18  2:29 PM   Specimen: Nasopharyngeal Swab  Result Value Ref Range   SARS Coronavirus 2 NEGATIVE NEGATIVE    Comment: (NOTE) If result is NEGATIVE SARS-CoV-2 target nucleic acids are NOT DETECTED. The SARS-CoV-2 RNA is generally detectable in upper and lower  respiratory specimens during the acute phase of infection. The lowest  concentration of SARS-CoV-2 viral copies this assay can detect is 250  copies / mL. A negative result does not preclude SARS-CoV-2 infection  and should not be used as the sole basis for treatment or other  patient management decisions.  A negative result may occur with  improper specimen collection / handling, submission of specimen other  than nasopharyngeal swab, presence of viral mutation(s) within the  areas targeted by this assay, and inadequate number of viral copies  (<250 copies / mL). A negative result must be combined with clinical  observations, patient history, and epidemiological information. If result is POSITIVE SARS-CoV-2 target nucleic acids are DETECTED. The SARS-CoV-2 RNA is generally detectable in upper and lower  respiratory specimens dur ing the acute phase of infection.  Positive  results are indicative of active infection with SARS-CoV-2.  Clinical  correlation with patient history and other diagnostic information is  necessary to determine patient infection status.  Positive results do  not rule out bacterial infection or co-infection with other viruses. If result is PRESUMPTIVE POSTIVE SARS-CoV-2 nucleic acids MAY BE PRESENT.   A presumptive positive result was obtained on the submitted specimen  and confirmed on repeat testing.  While 2019 novel coronavirus  (SARS-CoV-2) nucleic acids may be present in the submitted sample  additional confirmatory testing may be necessary for  epidemiological  and / or clinical management purposes  to differentiate between  SARS-CoV-2 and other Sarbecovirus currently known to infect humans.  If clinically indicated additional testing with an alternate test  methodology [phone]) is advised. The SARS-CoV-2 RNA is generally  detectable in upper and lower respiratory sp ecimens during the acute  phase of infection. The expected result is Negative. Fact Sheet for Patients:  StrictlyIdeas.no Fact Sheet for Healthcare Providers: BankingDealers.co.za This test is not yet approved or cleared by the [place] FDA and has been authorized for detection and/or diagnosis of SARS-CoV-2 by FDA under an Emergency Use Authorization (EUA).  This EUA will remain in effect (meaning this test can be used) for the duration of the COVID-19 declaration under Section 564(b)(1) of the Act, 21 U.S.C. section 360bbb-3(b)(1), unless the authorization is terminated or revoked sooner. Performed at [facility], [place] [address] [place], [place] [address]   Ethanol     Status: None   Collection Time: 06/25/18  3:46 PM  Result Value Ref Range   Alcohol, Ethyl (B) <10 <10 mg/dL    Comment: (NOTE) Lowest detectable limit for serum alcohol is 10 mg/dL. For medical purposes only. Performed at [facility], [place] [address], [place], [place] [address]   Urinalysis, Routine w reflex microscopic     Status: Abnormal   Collection Time: 06/25/18  4:19 PM  Result Value Ref Range   Color, Urine YELLOW YELLOW   APPearance HAZY (A) CLEAR   Specific Gravity, Urine 1.015 1.005 - 1.030   pH 6.0 5.0 - 8.0   Glucose, UA 50 (A) NEGATIVE mg/dL   Hgb urine dipstick NEGATIVE NEGATIVE   Bilirubin Urine NEGATIVE NEGATIVE   Ketones, ur NEGATIVE NEGATIVE mg/dL   Protein, ur 30 (A) NEGATIVE mg/dL   Nitrite  POSITIVE (A) NEGATIVE   Leukocytes,Ua SMALL (A) NEGATIVE   RBC / HPF 0-5 0 - 5  RBC/hpf   WBC, UA 21-50 0 - 5 WBC/hpf   Bacteria, UA FEW (A) NONE SEEN   Squamous Epithelial / LPF 0-5 0 - 5   WBC Clumps PRESENT    Mucus PRESENT     Comment: Performed at [facility], [place] [address] [place], [place] [address]  Urine rapid drug screen (hosp performed)     Status: Abnormal   Collection Time: 06/25/18  4:19 PM  Result Value Ref Range   Opiates NONE DETECTED NONE DETECTED   Cocaine NONE DETECTED NONE DETECTED   Benzodiazepines POSITIVE (A) NONE DETECTED   Amphetamines NONE DETECTED NONE DETECTED   Tetrahydrocannabinol NONE DETECTED NONE DETECTED   Barbiturates NONE DETECTED NONE DETECTED    Comment: (NOTE) DRUG SCREEN FOR MEDICAL PURPOSES ONLY.  IF CONFIRMATION IS NEEDED FOR ANY PURPOSE, NOTIFY LAB WITHIN 5 DAYS. LOWEST DETECTABLE LIMITS FOR URINE DRUG SCREEN Drug Class                     Cutoff (ng/mL) Amphetamine and metabolites    1000 Barbiturate and metabolites    200 Benzodiazepine                 725 Tricyclics and metabolites     300 Opiates and metabolites        300 Cocaine and metabolites        300 THC                            50 Performed at [facility], [place] [address], [place], [place] [address]   Lactic acid, plasma     Status: None   Collection Time: 06/25/18  4:42 PM  Result Value Ref Range   Lactic Acid, Venous 1.2 0.5 - 1.9 mmol/L    Comment: Performed at [facility], [place] [address], [place], [place] [address]  Ammonia     Status: Abnormal   Collection Time: 06/25/18  4:42 PM  Result Value Ref Range   Ammonia 42 (H) 9 - 35 umol/L    Comment: Performed at [facility], [place] [address], [place], [place] [address]   Ct Abdomen Pelvis Wo Contrast  Result Date: 06/25/2018 CLINICAL DATA:  Abnormal liver function test. Diarrhea. Nausea and vomiting. EXAM: CT ABDOMEN AND PELVIS WITHOUT CONTRAST TECHNIQUE: Multidetector CT imaging of the abdomen and pelvis was  performed following the standard protocol without IV contrast. COMPARISON:  Multiple exams, including MRI abdomen from 05/29/2013 and abdominal ultrasound from 07/03/2014 FINDINGS: Lower chest: Posterior basal segment lower lobe scarring or atelectasis bilaterally. Mild cardiomegaly. Right coronary artery atherosclerotic calcification. Hepatobiliary: Stable somewhat prominent caliber of the portal venous tributaries, portal vein, and splenic vein, not appreciably changed compared to the prior MRI from 05/29/2013. 1.2 cm by 0.9 cm fluid density lesion in segment 2 of the liver on image 17/2, this cyst previously measured 0.6 cm in diameter on 05/29/2013. Morphologic findings in liver such as mild prominence of the lateral segment left hepatic lobe and caudate lobe raise the possibility of early cirrhosis. Gallbladder unremarkable. Pancreas: Unremarkable Spleen: The spleen measures 14.5 by 6.6 by 11.7 cm (volume = 590 cm^3). Adrenals/Urinary Tract: The adrenal glands appear normal. There is a 5.5 by 4.6 cm right kidney upper pole cyst, this measured 5.0 by 4.0 cm  on 05/29/2013. 3 mm left mid kidney nonobstructive renal calculus on image 85/4. 3 mm left kidney lower pole nonobstructive calculus, image 84/4. Small hypodense lesions of the left mid kidney and left kidney lower pole are compatible with cysts and were shown on 05/29/2013. No hydronephrosis or hydroureter. Urinary bladder appears unremarkable. Stomach/Bowel: There is formed stool in the distal colon and rectum. No dilated bowel. Normal appendix. Vascular/Lymphatic: Aortoiliac atherosclerotic vascular disease. Small gastrohepatic ligament lymph nodes are present. No overtly pathologic adenopathy. Reproductive: Unremarkable Other: No supplemental non-categorized findings. Musculoskeletal: Subacute fractures of the left inferior pubic ramus and of the lateral portion of the left superior pubic ramus. These fractures show some early healing response but have  not fused. Such fractures are typically accompanied by a sacral fracture; there is some vertical sclerosis in the left sacral ala on image 93/4 which may represent a nondisplaced sacral fracture although this is a subtle finding. Lumbar spondylosis and degenerative disc disease noted with borderline left foraminal stenosis at L3-4. Grade 1 degenerative retrolisthesis at L2-3 and L3-4. IMPRESSION: 1. Subacute healing fractures of the left inferior and superior pubic ramus, and probably nondisplaced healing fracture of the left sacral ala. Correlate with history of trauma. 2. Formed stool in the colon, no dilated bowel or specific abnormal bowel wall thickening. 3. Mild cardiomegaly with right coronary artery atherosclerosis. 4. Probable early cirrhosis. Chronic prominence of the portal vein tributaries, portal vein, and splenic vein. Splenomegaly with splenic volume 590 cubic cm. 5. Nonobstructive left nephrolithiasis. 6. Benign hepatic and renal cysts. 7.  Aortic Atherosclerosis (ICD10-I70.0). Electronically Signed   By: Van Clines M.D.   On: 06/25/2018 18:15   Dg Chest 2 View  Result Date: 06/25/2018 CLINICAL DATA:  Weakness and nausea EXAM: CHEST - 2 VIEW COMPARISON:  06/09/2010 FINDINGS: Normal cardiac silhouette ectatic aorta. Central venous congestion increased from prior. No focal consolidation. No pleural fluid. No pneumothorax. No acute osseous abnormality. IMPRESSION: Increased central venous congestion. Electronically Signed   By: Suzy Bouchard M.D.   On: 06/25/2018 15:06   Ct Head Wo Contrast  Result Date: 06/25/2018 CLINICAL DATA:  Head trauma, weakness, fell 2 weeks ago, follow-up EXAM: CT HEAD WITHOUT CONTRAST TECHNIQUE: Contiguous axial images were obtained from the base of the skull through the vertex without intravenous contrast. Sagittal and coronal MPR images reconstructed from axial data set. COMPARISON:  02/02/2017 FINDINGS: Brain: Mild atrophy. Normal ventricular morphology.  No midline shift or mass effect. Otherwise normal appearance of brain parenchyma. No intracranial hemorrhage, mass lesion or evidence of acute infarction. No extra-axial fluid collections. Vascular: No hyperdense vessels Skull: Demineralized but intact Sinuses/Orbits: Chronic opacification of RIGHT maxillary sinus. Remaining visualized paranasal sinuses and mastoid air cells clear. Other: N/A IMPRESSION: No acute intracranial abnormalities. Chronic RIGHT maxillary sinus disease. Electronically Signed   By: Lavonia Dana M.D.   On: 06/25/2018 15:46    Pending Labs Unresulted Labs (From admission, onward)    Start     Ordered   06/25/18 1425  Gastrointestinal Panel by PCR , Stool  (Gastrointestinal Panel by PCR, Stool)  Once,   STAT     06/25/18 1424   06/25/18 1425  C difficile quick scan w PCR reflex  (C Difficile quick screen w PCR reflex panel)  Once, for 24 hours,   STAT     06/25/18 1424   06/25/18 1349  Blood Culture (routine x 2)  BLOOD CULTURE X 2,   STAT     06/25/18 1350  06/25/18 1349  Urine culture  ONCE - STAT,   STAT     06/25/18 1350          Vitals/Pain Today's Vitals   06/25/18 1630 06/25/18 1830 06/25/18 1852 06/25/18 1900  BP: 123/85 130/79 130/79 117/69  Pulse: 81 80 73 71  Resp: 19 18 20  (!) 21  Temp:      TempSrc:      SpO2: 99% 99% 98% 97%  Weight:      Height:      PainSc:        Isolation Precautions Enteric precautions (UV disinfection)  Medications Medications  0.9 %  sodium chloride infusion (1,000 mLs Intravenous New Bag/Given 06/25/18 1407)  vancomycin (VANCOCIN) IVPB 1000 mg/200 mL premix (1,000 mg Intravenous New Bag/Given 06/25/18 1828)  ceFEPIme (MAXIPIME) 2 g in sodium chloride 0.9 % 100 mL IVPB (0 g Intravenous Stopped 06/25/18 1500)  metroNIDAZOLE (FLAGYL) IVPB 500 mg (0 mg Intravenous Stopped 06/25/18 1635)  acetaminophen (TYLENOL) tablet 650 mg (650 mg Oral Given 06/25/18 1531)    Mobility walks with person assist

## 2018-06-25 NOTE — ED Notes (Signed)
Bed: LV74 Expected date:  Expected time:  Means of arrival:  Comments: EMS/weak/pneumonia

## 2018-06-25 NOTE — ED Triage Notes (Signed)
Pt to ED from home via EMS. Pt recently to ED 4 days ago dx with pneumonia negative Covid. Chief complaint today increase weakness and nausea x 2 days. Pt currently on antibiotic regimen.

## 2018-06-25 NOTE — ED Notes (Addendum)
Spoke with lab, able to add ordered labs to previously collected except lactic.

## 2018-06-25 NOTE — Progress Notes (Signed)
A consult was received from an ED physician for vanc/cefepime per pharmacy dosing.  The patient's profile has been reviewed for ht/wt/allergies/indication/available labs.   A one time order has been placed for vanc 1g and cefepime 2g.  Further antibiotics/pharmacy consults should be ordered by admitting physician if indicated.                       Thank you, Kara Mead 06/25/2018  1:53 PM

## 2018-06-25 NOTE — ED Provider Notes (Signed)
[name] Provider Note   CSN: [id number] Arrival date & time: 06/25/18  1149     History   Chief Complaint Chief Complaint  Patient presents with   Weakness    currently being treated for pneumonia   Nausea    HPI Dana Watkins is a 63 y.o. female.     HPI  Pt was seen at 1415. Per EMS and pt report: c/o gradual onset and persistence of multiple intermittent episodes of N/V/D for the past several weeks. Pt states she also has had decreased PO intake for the past 1 week, generalized weakness and home fevers to "101" for the past 2 days. Pt states she was evaluated by her PMD this past week for her symptoms, rx abx for presumed pneumonia and cdiff. Pt states she does not feel improved. Denies rash, no SOB/cough, no CP/palpitations, no abd pain, no black or blood in stools or emesis, no back pain, no focal motor weakness, no tingling/numbness in extremities. States she fell before her last hospital admission 05/2018, but denies any more recent falls.     Past Medical History:  Diagnosis Date   Acute hypokalemia 11/26/2014   Arthritis 05/15/2015   Benign essential hypertension 05/15/2015   C. difficile colitis    CAD (coronary artery disease)    a. cath 04/2017: ""Diffuse, calcific CAD particularly in the distal RCA and proximal to mid LAD.  LAD disease is nonobstructive.  RCA disease is more severe but does not appear significant.  Given her lack of symptoms, would pursue medical therapy."   CKD (chronic kidney disease), stage III (HCC)    Hypokalemia    Migraine headache 05/15/2015   Mild mitral regurgitation    Pancytopenia ([place])    Sepsis ([place]) 01/2017   Thrombocytopenia ([place])    a. chronic thrombocytopenia (ITP - remotely saw hematology).   Vitamin D deficiency     Patient Active Problem List   Diagnosis Date Noted   Community acquired pneumonia of right middle lobe of lung ([place]) 06/09/2018   AKI (acute kidney injury)  ([place]) 06/09/2018   GAD (generalized anxiety disorder) 11/06/2017   Abnormal findings on diagnostic imaging of heart and coronary circulation    Family history of coronary artery disease 05/09/2017   DOE (dyspnea on exertion) 05/09/2017   Coronary artery calcification seen on CT scan 03/08/2017   Sepsis ([place]) 02/06/2017   Acute bronchitis 02/06/2017   Dehydration    Diarrhea 02/03/2017   C. difficile colitis 02/03/2017   Nausea & vomiting 02/02/2017   Arthritis 05/15/2015   Benign essential hypertension 05/15/2015   Migraine headache 05/15/2015   Hypokalemia 11/26/2014   Acute renal failure ([place]) 11/26/2014   Elevated alkaline phosphatase level 11/26/2014   Abnormal weight gain 11/26/2014   Chronic leukopenia 05/28/2014   Anemia of chronic illness 05/28/2014   Thrombocytopenia ([place]) 04/30/2013   Elevated liver function tests 04/30/2013    Past Surgical History:  Procedure Laterality Date   CESAREAN SECTION     24 years ago   LEFT HEART CATH AND CORONARY ANGIOGRAPHY N/A 05/12/2017   Procedure: LEFT HEART CATH AND CORONARY ANGIOGRAPHY;  Surgeon: [name] Booze, MD;  Location: [facility];  Service: Cardiovascular;  Laterality: N/A;   tonsils and adneoids     as a child   ULTRASOUND GUIDANCE FOR VASCULAR ACCESS  05/12/2017   Procedure: Ultrasound Guidance For Vascular Access;  Surgeon: [name] Booze, MD;  Location: [facility];  Service:  Cardiovascular;;     OB History   No obstetric history on file.      Home Medications    Prior to Admission medications   Medication Sig Start Date End Date Taking? Authorizing Provider  azithromycin (ZITHROMAX) 250 MG tablet Take 250-500 mg by mouth See admin instructions. Take 550m on day 1, take 2525mon days 2,3,4,5. 06/22/18  Yes [provider]  Cholecalciferol (VITAMIN D3) 2000 units TABS Take 1 tablet by mouth daily.   Yes [provider]  KLOR-CON M20 20 MEQ  tablet Take 40 mEq by mouth 2 (two) times a day. 06/22/18  Yes [provider]  MAGNESIUM PO Take 1 tablet by mouth daily.    Yes [provider]  metroNIDAZOLE (FLAGYL) 500 MG tablet Take 500 mg by mouth 3 (three) times daily. For 10 days 06/22/18  Yes [provider]  naproxen sodium (ALEVE) 220 MG tablet Take 220 mg by mouth 2 (two) times daily as needed (pain/headache).   Yes [provider]  polyvinyl alcohol (LIQUIFILM TEARS) 1.4 % ophthalmic solution Place 1 drop into both eyes as needed for dry eyes.   Yes [provider]    Family History Family History  Problem Relation Age of Onset   Pernicious anemia Maternal Grandfather    Heart attack Maternal Grandfather    Atrial fibrillation Mother    Supraventricular tachycardia Father    Heart disease Paternal Grandfather     Social History Social History   Tobacco Use   Smoking status: Former Smoker    Years: 2.00    Start date: 06/25/1968    Quit date: 05/16/2014    Years since quitting: 4.1   Smokeless tobacco: Never Used  Substance Use Topics   Alcohol use: Yes    Alcohol/week: 1.0 standard drinks    Types: 1 Glasses of wine per week    Comment: occ   Drug use: No     Allergies   Sulfa antibiotics   Review of Systems Review of Systems ROS: Statement: All systems negative except as marked or noted in the HPI; Constitutional: +fever and chills, generalized weakness.. ; ; Eyes: Negative for eye pain, redness and discharge. ; ; ENMT: Negative for ear pain, hoarseness, nasal congestion, sinus pressure and sore throat. ; ; Cardiovascular: Negative for chest pain, palpitations, diaphoresis, dyspnea and peripheral edema. ; ; Respiratory: Negative for cough, wheezing and stridor. ; ; Gastrointestinal: +N/V/D, decreased PO intake. Negative for abdominal pain, blood in stool, hematemesis, jaundice and rectal bleeding. . ; ; Genitourinary: Negative for dysuria, flank pain and  hematuria. ; ; Musculoskeletal: Negative for back pain and neck pain. Negative for swelling and trauma.; ; Skin: Negative for pruritus, rash, abrasions, blisters, bruising and skin lesion.; ; Neuro: Negative for headache, lightheadedness and neck stiffness. Negative for weakness, altered level of consciousness, altered mental status, extremity weakness, paresthesias, involuntary movement, seizure and syncope.       Physical Exam Updated Vital Signs BP 133/80    Pulse 85    Temp (!) 100.8 F (38.2 C) (Oral)    Resp (!) 25    Ht 5' 8"  (1.727 m)    Wt 59 kg    SpO2 98%    BMI 19.77 kg/m    Patient Vitals for the past 24 hrs:  BP Temp Temp src Pulse Resp SpO2 Height Weight  06/25/18 1630 123/85 -- -- 81 19 99 % -- --  06/25/18 1600 132/74 -- -- 81 16 98 % -- --  06/25/18 1530 132/70 -- -- 85 (!) 21 100 % -- --  06/25/18 1500 131/73 -- -- 85 (!) 25 99 % -- --  06/25/18 1430 (!) 141/83 -- -- 90 (!) 21 98 % -- --  06/25/18 1400 (!) 144/78 -- -- 91 (!) 23 98 % -- --  06/25/18 1330 133/80 -- -- 85 (!) 25 98 % -- --  06/25/18 1201 -- (!) 100.8 F (38.2 C) Oral -- -- -- -- --  06/25/18 1200 (!) 141/74 -- -- 86 13 100 % -- --  06/25/18 1159 -- -- -- -- -- -- 5' 8"  (1.727 m) 59 kg    Physical Exam 1420: Physical examination:  Nursing notes reviewed; Vital signs and O2 SAT reviewed;  Constitutional: Well developed, Well nourished, In no acute distress; Head:  Normocephalic, atraumatic; Eyes: EOMI, PERRL, No scleral icterus; ENMT: Mouth and pharynx normal, Mucous membranes dry; Neck: Supple, Full range of motion, No lymphadenopathy. No meningeal signs.; Cardiovascular: Regular rate and rhythm, No gallop; Respiratory: Breath sounds clear & equal bilaterally, No wheezes.  Speaking full sentences with ease, Normal respiratory effort/excursion; Chest: Nontender, Movement normal; Abdomen: Soft, Nontender, Nondistended, Normal bowel sounds; Genitourinary: No CVA tenderness; Spine:  No midline CS, TS, LS  tenderness.;; Extremities: Peripheral pulses normal, No tenderness, No edema, No calf edema or asymmetry.; Neuro: AA&Ox3, tangential historian. No facial droop. Major CN grossly intact.  Speech clear. No gross focal motor or sensory deficits in extremities.; Skin: Color normal, Warm, Dry.    ED Treatments / Results  Labs (all labs ordered are listed, but only abnormal results are displayed)   EKG EKG Interpretation  Date/Time:  Sunday June 25 2018 12:01:57 EDT Ventricular Rate:  87 PR Interval:    QRS Duration: 102 QT Interval:  411 QTC Calculation: 495 R Axis:   11 Text Interpretation:  Sinus rhythm Borderline prolonged QT interval Baseline wander Artifact When compared with ECG of 02/02/2017 No significant change was found Confirmed by Francine Graven [id number]) on 06/25/2018 2:51:07 PM   Radiology   Procedures Procedures (including critical care time)  Medications Ordered in ED Medications  0.9 %  sodium chloride infusion (1,000 mLs Intravenous New Bag/Given 06/25/18 1407)  ceFEPIme (MAXIPIME) 2 g in sodium chloride 0.9 % 100 mL IVPB (2 g Intravenous New Bag/Given 06/25/18 1424)  metroNIDAZOLE (FLAGYL) IVPB 500 mg (has no administration in time range)  vancomycin (VANCOCIN) IVPB 1000 mg/200 mL premix (has no administration in time range)  acetaminophen (TYLENOL) tablet 650 mg (has no administration in time range)     Initial Impression / Assessment and Plan / ED Course  I have reviewed the triage vital signs and the nursing notes.  Pertinent labs & imaging results that were available during my care of the patient were reviewed by me and considered in my medical decision making (see chart for details).     MDM Reviewed: previous chart, nursing note and vitals Reviewed previous: labs and ECG Interpretation: labs, ECG, x-ray and CT scan Total time providing critical care: 30-74 minutes. This excludes time spent performing separately reportable procedures and  services. Consults: admitting MD   CRITICAL CARE Performed by: Francine Graven Total critical care time: 45 minutes Critical care time was exclusive of separately billable procedures and treating other patients. Critical care was necessary to treat or prevent imminent or life-threatening deterioration. Critical care was time spent personally by me on the following activities: development of treatment plan with patient and/or surrogate as well as nursing, discussions with  consultants, evaluation of patient's response to treatment, examination of patient, obtaining history from patient or surrogate, ordering and performing treatments and interventions, ordering and review of laboratory studies, ordering and review of radiographic studies, pulse oximetry and re-evaluation of patient's condition.   Results for orders placed or performed during the hospital encounter of 06/25/18  Blood Culture (routine x 2)   Specimen: BLOOD  Result Value Ref Range   Specimen Description      BLOOD BLOOD RIGHT FOREARM Performed at [facility] [address] [place], [place] [address]    Special Requests      BOTTLES DRAWN AEROBIC AND ANAEROBIC Blood Culture adequate volume Performed at [facility] [address] [place], [place] [address]    Culture      NO GROWTH < 12 HOURS Performed at [facility] [address] [place], [place] [address]    Report Status PENDING   SARS Coronavirus 2 (CEPHEID - Performed in [facility] hospital lab), [facility] Order   Specimen: Nasopharyngeal Swab  Result Value Ref Range   SARS Coronavirus 2 NEGATIVE NEGATIVE  Lactic acid, plasma  Result Value Ref Range   Lactic Acid, Venous 1.5 0.5 - 1.9 mmol/L  Lactic acid, plasma  Result Value Ref Range   Lactic Acid, Venous 1.2 0.5 - 1.9 mmol/L  Comprehensive metabolic panel  Result Value Ref Range   Sodium 140 135 - 145 mmol/L   Potassium 3.5 3.5 - 5.1 mmol/L   Chloride 116  (H) 98 - 111 mmol/L   CO2 17 (L) 22 - 32 mmol/L   Glucose, Bld 106 (H) 70 - 99 mg/dL   BUN 14 8 - 23 mg/dL   Creatinine, Ser 1.38 (H) 0.44 - 1.00 mg/dL   Calcium 9.5 8.9 - 10.3 mg/dL   Total Protein 6.7 6.5 - 8.1 g/dL   Albumin 3.1 (L) 3.5 - 5.0 g/dL   AST 63 (H) 15 - 41 U/L   ALT 27 0 - 44 U/L   Alkaline Phosphatase 169 (H) 38 - 126 U/L   Total Bilirubin 3.6 (H) 0.3 - 1.2 mg/dL   GFR calc non Af Amer 41 (L) >60 mL/min   GFR calc Af Amer 47 (L) >60 mL/min   Anion gap 7 5 - 15  CBC WITH DIFFERENTIAL  Result Value Ref Range   WBC 11.0 (H) 4.0 - 10.5 K/uL   RBC 3.02 (L) 3.87 - 5.11 MIL/uL   Hemoglobin 9.7 (L) 12.0 - 15.0 g/dL   HCT 30.4 (L) 36.0 - 46.0 %   MCV 100.7 (H) 80.0 - 100.0 fL   MCH 32.1 26.0 - 34.0 pg   MCHC 31.9 30.0 - 36.0 g/dL   RDW 16.0 (H) 11.5 - 15.5 %   Platelets 87 (L) 150 - 400 K/uL   nRBC 0.0 0.0 - 0.2 %   Neutrophils Relative % 87 %   Neutro Abs 9.5 (H) 1.7 - 7.7 K/uL   Lymphocytes Relative 6 %   Lymphs Abs 0.7 0.7 - 4.0 K/uL   Monocytes Relative 7 %   Monocytes Absolute 0.8 0.1 - 1.0 K/uL   Eosinophils Relative 0 %   Eosinophils Absolute 0.0 0.0 - 0.5 K/uL   Basophils Relative 0 %   Basophils Absolute 0.0 0.0 - 0.1 K/uL   Immature Granulocytes 0 %   Abs Immature Granulocytes 0.03 0.00 - 0.07 K/uL  Urinalysis, Routine w reflex microscopic  Result Value Ref Range   Color, Urine YELLOW  YELLOW   APPearance HAZY (A) CLEAR   Specific Gravity, Urine 1.015 1.005 - 1.030   pH 6.0 5.0 - 8.0   Glucose, UA 50 (A) NEGATIVE mg/dL   Hgb urine dipstick NEGATIVE NEGATIVE   Bilirubin Urine NEGATIVE NEGATIVE   Ketones, ur NEGATIVE NEGATIVE mg/dL   Protein, ur 30 (A) NEGATIVE mg/dL   Nitrite POSITIVE (A) NEGATIVE   Leukocytes,Ua SMALL (A) NEGATIVE   RBC / HPF 0-5 0 - 5 RBC/hpf   WBC, UA 21-50 0 - 5 WBC/hpf   Bacteria, UA FEW (A) NONE SEEN   Squamous Epithelial / LPF 0-5 0 - 5   WBC Clumps PRESENT    Mucus PRESENT   Lipase, blood  Result Value Ref Range    Lipase 36 11 - 51 U/L  Troponin I - Once  Result Value Ref Range   Troponin I 0.03 (HH) <0.03 ng/mL  Ethanol  Result Value Ref Range   Alcohol, Ethyl (B) <10 <10 mg/dL  Urine rapid drug screen (hosp performed)  Result Value Ref Range   Opiates NONE DETECTED NONE DETECTED   Cocaine NONE DETECTED NONE DETECTED   Benzodiazepines POSITIVE (A) NONE DETECTED   Amphetamines NONE DETECTED NONE DETECTED   Tetrahydrocannabinol NONE DETECTED NONE DETECTED   Barbiturates NONE DETECTED NONE DETECTED  Ammonia  Result Value Ref Range   Ammonia 42 (H) 9 - 35 umol/L   Ct Abdomen Pelvis Wo Contrast Result Date: 06/25/2018 CLINICAL DATA:  Abnormal liver function test. Diarrhea. Nausea and vomiting. EXAM: CT ABDOMEN AND PELVIS WITHOUT CONTRAST TECHNIQUE: Multidetector CT imaging of the abdomen and pelvis was performed following the standard protocol without IV contrast. COMPARISON:  Multiple exams, including MRI abdomen from 05/29/2013 and abdominal ultrasound from 07/03/2014 FINDINGS: Lower chest: Posterior basal segment lower lobe scarring or atelectasis bilaterally. Mild cardiomegaly. Right coronary artery atherosclerotic calcification. Hepatobiliary: Stable somewhat prominent caliber of the portal venous tributaries, portal vein, and splenic vein, not appreciably changed compared to the prior MRI from 05/29/2013. 1.2 cm by 0.9 cm fluid density lesion in segment 2 of the liver on image 17/2, this cyst previously measured 0.6 cm in diameter on 05/29/2013. Morphologic findings in liver such as mild prominence of the lateral segment left hepatic lobe and caudate lobe raise the possibility of early cirrhosis. Gallbladder unremarkable. Pancreas: Unremarkable Spleen: The spleen measures 14.5 by 6.6 by 11.7 cm (volume = 590 cm^3). Adrenals/Urinary Tract: The adrenal glands appear normal. There is a 5.5 by 4.6 cm right kidney upper pole cyst, this measured 5.0 by 4.0 cm on 05/29/2013. 3 mm left mid kidney nonobstructive  renal calculus on image 85/4. 3 mm left kidney lower pole nonobstructive calculus, image 84/4. Small hypodense lesions of the left mid kidney and left kidney lower pole are compatible with cysts and were shown on 05/29/2013. No hydronephrosis or hydroureter. Urinary bladder appears unremarkable. Stomach/Bowel: There is formed stool in the distal colon and rectum. No dilated bowel. Normal appendix. Vascular/Lymphatic: Aortoiliac atherosclerotic vascular disease. Small gastrohepatic ligament lymph nodes are present. No overtly pathologic adenopathy. Reproductive: Unremarkable Other: No supplemental non-categorized findings. Musculoskeletal: Subacute fractures of the left inferior pubic ramus and of the lateral portion of the left superior pubic ramus. These fractures show some early healing response but have not fused. Such fractures are typically accompanied by a sacral fracture; there is some vertical sclerosis in the left sacral ala on image 93/4 which may represent a nondisplaced sacral fracture although this is a subtle finding. Lumbar spondylosis and degenerative  disc disease noted with borderline left foraminal stenosis at L3-4. Grade 1 degenerative retrolisthesis at L2-3 and L3-4. IMPRESSION: 1. Subacute healing fractures of the left inferior and superior pubic ramus, and probably nondisplaced healing fracture of the left sacral ala. Correlate with history of trauma. 2. Formed stool in the colon, no dilated bowel or specific abnormal bowel wall thickening. 3. Mild cardiomegaly with right coronary artery atherosclerosis. 4. Probable early cirrhosis. Chronic prominence of the portal vein tributaries, portal vein, and splenic vein. Splenomegaly with splenic volume 590 cubic cm. 5. Nonobstructive left nephrolithiasis. 6. Benign hepatic and renal cysts. 7.  Aortic Atherosclerosis (ICD10-I70.0). Electronically Signed   By: Van Clines M.D.   On: 06/25/2018 18:15   Dg Chest 2 View Result Date:  06/25/2018 CLINICAL DATA:  Weakness and nausea EXAM: CHEST - 2 VIEW COMPARISON:  06/09/2010 FINDINGS: Normal cardiac silhouette ectatic aorta. Central venous congestion increased from prior. No focal consolidation. No pleural fluid. No pneumothorax. No acute osseous abnormality. IMPRESSION: Increased central venous congestion. Electronically Signed   By: Suzy Bouchard M.D.   On: 06/25/2018 15:06    Ct Head Wo Contrast Result Date: 06/25/2018 CLINICAL DATA:  Head trauma, weakness, fell 2 weeks ago, follow-up EXAM: CT HEAD WITHOUT CONTRAST TECHNIQUE: Contiguous axial images were obtained from the base of the skull through the vertex without intravenous contrast. Sagittal and coronal MPR images reconstructed from axial data set. COMPARISON:  02/02/2017 FINDINGS: Brain: Mild atrophy. Normal ventricular morphology. No midline shift or mass effect. Otherwise normal appearance of brain parenchyma. No intracranial hemorrhage, mass lesion or evidence of acute infarction. No extra-axial fluid collections. Vascular: No hyperdense vessels Skull: Demineralized but intact Sinuses/Orbits: Chronic opacification of RIGHT maxillary sinus. Remaining visualized paranasal sinuses and mastoid air cells clear. Other: N/A IMPRESSION: No acute intracranial abnormalities. Chronic RIGHT maxillary sinus disease. Electronically Signed   By: Lavonia Dana M.D.   On: 06/25/2018 15:46    Results for [employer], CASHMERE DINGLEY" (MRN [id number]) as of 06/25/2018 18:26  Ref. Range 06/09/2018 12:01 06/09/2018 12:01 06/10/2018 06:32 06/25/2018 13:15  AST Latest Ref Range: 15 - 41 U/L 81 (H)  60 (H) 63 (H)  ALT Latest Ref Range: 0 - 44 U/L 34  28 27  Total Bilirubin Latest Ref Range: 0.3 - 1.2 mg/dL 4.4 (H) 4.1 (H) 2.7 (H) 3.6 (H)    Results for [employer], CHASLYN EISEN" (MRN [id number]) as of 06/25/2018 18:26  Ref. Range 06/29/2017 10:47 06/09/2018 12:01 06/10/2018 06:32 06/25/2018 13:15  Hemoglobin Latest Ref Range: 12.0 - 15.0 g/dL 9.9 (L) 10.3  (L) 8.2 (L) 9.7 (L)  HCT Latest Ref Range: 36.0 - 46.0 % 30.5 (L) 31.1 (L) 25.8 (L) 30.4 (L)  Platelets Latest Ref Range: 150 - 400 K/uL 76 (L) 89 (L) 67 (L) 87 (L)    Dana Watkins was evaluated in Emergency Department on 06/25/2018 for the symptoms described in the history of present illness. She was evaluated in the context of the global COVID-19 pandemic, which necessitated consideration that the patient might be at risk for infection with the SARS-CoV-2 virus that causes COVID-19. Institutional protocols and algorithms that pertain to the evaluation of patients at risk for COVID-19 are in a state of rapid change based on information released by regulatory bodies including the CDC and federal and state organizations. These policies and algorithms were followed during the patient's care in the ED.    1835:  Code Sepsis called on pt's arrival. [facility] and UC obtained, IV abx given.  Labs per baseline. No clear recent workup or DDx for elevated LFT's; CT A/P obtained and without acute process. Pt states she fell last month; likely cause for fx seen on CT scan.  T/C returned from [facility] Dr. Jonelle Sidle, case discussed, including:  HPI, pertinent PM/SHx, VS/PE, dx testing, ED course and treatment:  Agreeable to admit.     Final Clinical Impressions(s) / ED Diagnoses   Final diagnoses:  None    ED Discharge Orders    None       Francine Graven, DO 06/29/18 6203

## 2018-06-25 NOTE — ED Notes (Signed)
Patient transported to CT 

## 2018-06-25 NOTE — H&P (Signed)
History and Physical   Dana Watkins VOP:[id number] DOB: [birth year] DOA: 06/25/2018  Referring MD/NP/PA: Dr. Thurnell Garbe  PCP: Lawerance Cruel, MD   Outpatient Specialists: None  Patient coming from: Home  Chief Complaint: Generalized weakness  HPI: Dana Watkins is a 63 y.o. female with medical history significant of Hypertension, C. difficile colitis, coronary artery disease, pancytopenia who came to the ER complaining of generalized weakness and nausea.  She was apparently recently treated for pneumonia by her PCP about a week ago.  She has continued to have significant nausea with her last dose of antibiotics supposed to be today.  No active diarrhea but she has previously been treated for C. difficile.  She is getting intermittent nausea vomiting with diarrhea but today is been more persistent.  Denied any shortness of breath or cough but had a fever up to 101 at home.  She denied any new sick contacts.  Patient was seen and evaluated and found to have evidence of UTI and parameters for SIRS.  She is therefore being admitted with early sepsis due to UTI..  ED Course: Temperature is 100.8 with blood pressure 144/78, pulse 91 respiratory 25 and oxygen sat 97% room air.  White count is 11,000 hemoglobin 9.7 and platelets 87.  Sodium 140 potassium 3.5 chloride 116 CO2 of 17 with creatinine 1.38 and glucose 106.  Lactic acid is 1.2.  Troponin 0 0.03.  Urinalysis showed positive nitrite WBC 21-50 with bacteria.  Urine drug screen is positive for benzodiazepines.  Head CT without contrast is negative CT abdomen pelvis showed no active disease.  Patient is therefore being admitted with UTI leading to early sepsis.  Review of Systems: As per HPI otherwise 10 point review of systems negative.    Past Medical History:  Diagnosis Date   Acute hypokalemia 11/26/2014   Arthritis 05/15/2015   Benign essential hypertension 05/15/2015   C. difficile colitis    CAD (coronary artery disease)    a. cath 04/2017: ""Diffuse, calcific CAD particularly in the distal RCA and proximal to mid LAD.  LAD disease is nonobstructive.  RCA disease is more severe but does not appear significant.  Given her lack of symptoms, would pursue medical therapy."   CKD (chronic kidney disease), stage III (HCC)    Hypokalemia    Migraine headache 05/15/2015   Mild mitral regurgitation    Pancytopenia ([place])    Sepsis ([place]) 01/2017   Thrombocytopenia ([place])    a. chronic thrombocytopenia ([facility] - remotely saw hematology).   Vitamin D deficiency     Past Surgical History:  Procedure Laterality Date   CESAREAN SECTION     24 years ago   LEFT HEART CATH AND CORONARY ANGIOGRAPHY N/A 05/12/2017   Procedure: LEFT HEART CATH AND CORONARY ANGIOGRAPHY;  Surgeon: [name] Booze, MD;  Location: [facility];  Service: Cardiovascular;  Laterality: N/A;   tonsils and adneoids     as a child   ULTRASOUND GUIDANCE FOR VASCULAR ACCESS  05/12/2017   Procedure: Ultrasound Guidance For Vascular Access;  Surgeon: [name] Booze, MD;  Location: [facility];  Service: Cardiovascular;;     reports that she quit smoking about 4 years ago. She started smoking about 50 years ago. She quit after 2.00 years of use. She has never used smokeless tobacco. She reports current alcohol use of about 1.0 standard drinks of alcohol per week. She reports that she does not use drugs.  Allergies  Allergen Reactions  Sulfa Antibiotics Nausea And Vomiting and Rash    Family History  Problem Relation Age of Onset   Pernicious anemia Maternal Grandfather    Heart attack Maternal Grandfather    Atrial fibrillation Mother    Supraventricular tachycardia Father    Heart disease Paternal Grandfather      Prior to Admission medications   Medication Sig Start Date End Date Taking? Authorizing Provider  azithromycin (ZITHROMAX) 250 MG tablet Take 250-500 mg by mouth See admin instructions. Take 572m on day  1, take 2546mon days 2,3,4,5. 06/22/18  Yes [provider]  Cholecalciferol (VITAMIN D3) 2000 units TABS Take 1 tablet by mouth daily.   Yes [provider]  KLOR-CON M20 20 MEQ tablet Take 40 mEq by mouth 2 (two) times a day. 06/22/18  Yes [provider]  MAGNESIUM PO Take 1 tablet by mouth daily.    Yes [provider]  metroNIDAZOLE (FLAGYL) 500 MG tablet Take 500 mg by mouth 3 (three) times daily. For 10 days 06/22/18  Yes [provider]  naproxen sodium (ALEVE) 220 MG tablet Take 220 mg by mouth 2 (two) times daily as needed (pain/headache).   Yes [provider]  polyvinyl alcohol (LIQUIFILM TEARS) 1.4 % ophthalmic solution Place 1 drop into both eyes as needed for dry eyes.   Yes [provider]    Physical Exam: Vitals:   06/25/18 1630 06/25/18 1830 06/25/18 1852 06/25/18 1900  BP: 123/85 130/79 130/79 117/69  Pulse: 81 80 73 71  Resp: 19 18 20  (!) 21  Temp:      TempSrc:      SpO2: 99% 99% 98% 97%  Weight:      Height:          Constitutional: Chronically ill looking, no acute distress Vitals:   06/25/18 1630 06/25/18 1830 06/25/18 1852 06/25/18 1900  BP: 123/85 130/79 130/79 117/69  Pulse: 81 80 73 71  Resp: 19 18 20  (!) 21  Temp:      TempSrc:      SpO2: 99% 99% 98% 97%  Weight:      Height:       Eyes: PERRL, lids and conjunctivae normal ENMT: Mucous membranes are dry. Posterior pharynx clear of any exudate or lesions.Normal dentition.  Neck: normal, supple, no masses, no thyromegaly Respiratory: clear to auscultation bilaterally, no wheezing, no crackles. Normal respiratory effort. No accessory muscle use.  Cardiovascular: Regular rate and rhythm, no murmurs / rubs / gallops. No extremity edema. 2+ pedal pulses. No carotid bruits.  Abdomen: Mild epigastric tenderness, no masses palpated. No hepatosplenomegaly. Bowel sounds positive.  Musculoskeletal: no clubbing / cyanosis. No joint deformity upper  and lower extremities. Good ROM, no contractures. Normal muscle tone.  Skin: no rashes, lesions, ulcers. No induration Neurologic: CN 2-12 grossly intact. Sensation intact, DTR normal. Strength 5/5 in all 4.  Psychiatric: Normal judgment and insight. Alert and oriented x 3. Normal mood.     Labs on Admission: I have personally reviewed following labs and imaging studies  CBC: Recent Labs  Lab 06/25/18 1315  WBC 11.0*  NEUTROABS 9.5*  HGB 9.7*  HCT 30.4*  MCV 100.7*  PLT 87*   Basic Metabolic Panel: Recent Labs  Lab 06/25/18 1315  NA 140  K 3.5  CL 116*  CO2 17*  GLUCOSE 106*  BUN 14  CREATININE 1.38*  CALCIUM 9.5   GFR: Estimated Creatinine Clearance: 39.4 mL/min (A) (by C-G formula based on SCr of 1.38  mg/dL (H)). Liver Function Tests: Recent Labs  Lab 06/25/18 1315  AST 63*  ALT 27  ALKPHOS 169*  BILITOT 3.6*  PROT 6.7  ALBUMIN 3.1*   Recent Labs  Lab 06/25/18 1315  LIPASE 36   Recent Labs  Lab 06/25/18 1642  AMMONIA 42*   Coagulation Profile: No results for input(s): INR, PROTIME in the last 168 hours. Cardiac Enzymes: Recent Labs  Lab 06/25/18 1315  TROPONINI 0.03*   BNP (last 3 results) No results for input(s): PROBNP in the last 8760 hours. HbA1C: No results for input(s): HGBA1C in the last 72 hours. CBG: No results for input(s): GLUCAP in the last 168 hours. Lipid Profile: No results for input(s): CHOL, HDL, LDLCALC, TRIG, CHOLHDL, LDLDIRECT in the last 72 hours. Thyroid Function Tests: No results for input(s): TSH, T4TOTAL, FREET4, T3FREE, THYROIDAB in the last 72 hours. Anemia Panel: No results for input(s): VITAMINB12, FOLATE, FERRITIN, TIBC, IRON, RETICCTPCT in the last 72 hours. Urine analysis:    Component Value Date/Time   COLORURINE YELLOW 06/25/2018 1619   APPEARANCEUR HAZY (A) 06/25/2018 1619   LABSPEC 1.015 06/25/2018 1619   PHURINE 6.0 06/25/2018 1619   GLUCOSEU 50 (A) 06/25/2018 1619   HGBUR NEGATIVE 06/25/2018  1619   BILIRUBINUR NEGATIVE 06/25/2018 1619   KETONESUR NEGATIVE 06/25/2018 1619   PROTEINUR 30 (A) 06/25/2018 1619   NITRITE POSITIVE (A) 06/25/2018 1619   LEUKOCYTESUR SMALL (A) 06/25/2018 1619   Sepsis Labs: @LABRCNTIP (procalcitonin:4,lacticidven:4) ) Recent Results (from the past 240 hour(s))  Blood Culture (routine x 2)     Status: None (Preliminary result)   Collection Time: 06/25/18  1:15 PM   Specimen: BLOOD  Result Value Ref Range Status   Specimen Description   Final    BLOOD BLOOD RIGHT FOREARM Performed at [facility], [place] [address], [place], [place] [address]    Special Requests   Final    BOTTLES DRAWN AEROBIC AND ANAEROBIC Blood Culture adequate volume Performed at [facility] [address], [place], [place] [address]    Culture   Final    NO GROWTH < 12 HOURS Performed at [facility] [address], [place], [place] [address]    Report Status PENDING  Incomplete  SARS Coronavirus 2 (CEPHEID - Performed in [facility] hospital lab), Hosp Order     Status: None   Collection Time: 06/25/18  2:29 PM   Specimen: Nasopharyngeal Swab  Result Value Ref Range Status   SARS Coronavirus 2 NEGATIVE NEGATIVE Final    Comment: (NOTE) If result is NEGATIVE SARS-CoV-2 target nucleic acids are NOT DETECTED. The SARS-CoV-2 RNA is generally detectable in upper and lower  respiratory specimens during the acute phase of infection. The lowest  concentration of SARS-CoV-2 viral copies this assay can detect is 250  copies / mL. A negative result does not preclude SARS-CoV-2 infection  and should not be used as the sole basis for treatment or other  patient management decisions.  A negative result may occur with  improper specimen collection / handling, submission of specimen other  than nasopharyngeal swab, presence of viral mutation(s) within the  areas targeted by this assay, and inadequate number of viral copies  (<250 copies  / mL). A negative result must be combined with clinical  observations, patient history, and epidemiological information. If result is POSITIVE SARS-CoV-2 target nucleic acids are DETECTED. The SARS-CoV-2 RNA is generally detectable in upper and lower  respiratory specimens dur ing the acute  phase of infection.  Positive  results are indicative of active infection with SARS-CoV-2.  Clinical  correlation with patient history and other diagnostic information is  necessary to determine patient infection status.  Positive results do  not rule out bacterial infection or co-infection with other viruses. If result is PRESUMPTIVE POSTIVE SARS-CoV-2 nucleic acids MAY BE PRESENT.   A presumptive positive result was obtained on the submitted specimen  and confirmed on repeat testing.  While 2019 novel coronavirus  (SARS-CoV-2) nucleic acids may be present in the submitted sample  additional confirmatory testing may be necessary for epidemiological  and / or clinical management purposes  to differentiate between  SARS-CoV-2 and other Sarbecovirus currently known to infect humans.  If clinically indicated additional testing with an alternate test  methodology [phone]) is advised. The SARS-CoV-2 RNA is generally  detectable in upper and lower respiratory sp ecimens during the acute  phase of infection. The expected result is Negative. Fact Sheet for Patients:  StrictlyIdeas.no Fact Sheet for Healthcare Providers: BankingDealers.co.za This test is not yet approved or cleared by the [place] FDA and has been authorized for detection and/or diagnosis of SARS-CoV-2 by FDA under an Emergency Use Authorization (EUA).  This EUA will remain in effect (meaning this test can be used) for the duration of the COVID-19 declaration under Section 564(b)(1) of the Act, 21 U.S.C. section 360bbb-3(b)(1), unless the authorization is terminated or revoked  sooner. Performed at [facility], [place] [address] [place], [place] [address]      Radiological Exams on Admission: Ct Abdomen Pelvis Wo Contrast  Result Date: 06/25/2018 CLINICAL DATA:  Abnormal liver function test. Diarrhea. Nausea and vomiting. EXAM: CT ABDOMEN AND PELVIS WITHOUT CONTRAST TECHNIQUE: Multidetector CT imaging of the abdomen and pelvis was performed following the standard protocol without IV contrast. COMPARISON:  Multiple exams, including MRI abdomen from 05/29/2013 and abdominal ultrasound from 07/03/2014 FINDINGS: Lower chest: Posterior basal segment lower lobe scarring or atelectasis bilaterally. Mild cardiomegaly. Right coronary artery atherosclerotic calcification. Hepatobiliary: Stable somewhat prominent caliber of the portal venous tributaries, portal vein, and splenic vein, not appreciably changed compared to the prior MRI from 05/29/2013. 1.2 cm by 0.9 cm fluid density lesion in segment 2 of the liver on image 17/2, this cyst previously measured 0.6 cm in diameter on 05/29/2013. Morphologic findings in liver such as mild prominence of the lateral segment left hepatic lobe and caudate lobe raise the possibility of early cirrhosis. Gallbladder unremarkable. Pancreas: Unremarkable Spleen: The spleen measures 14.5 by 6.6 by 11.7 cm (volume = 590 cm^3). Adrenals/Urinary Tract: The adrenal glands appear normal. There is a 5.5 by 4.6 cm right kidney upper pole cyst, this measured 5.0 by 4.0 cm on 05/29/2013. 3 mm left mid kidney nonobstructive renal calculus on image 85/4. 3 mm left kidney lower pole nonobstructive calculus, image 84/4. Small hypodense lesions of the left mid kidney and left kidney lower pole are compatible with cysts and were shown on 05/29/2013. No hydronephrosis or hydroureter. Urinary bladder appears unremarkable. Stomach/Bowel: There is formed stool in the distal colon and rectum. No dilated bowel. Normal appendix. Vascular/Lymphatic: Aortoiliac  atherosclerotic vascular disease. Small gastrohepatic ligament lymph nodes are present. No overtly pathologic adenopathy. Reproductive: Unremarkable Other: No supplemental non-categorized findings. Musculoskeletal: Subacute fractures of the left inferior pubic ramus and of the lateral portion of the left superior pubic ramus. These fractures show some early healing response but have not fused. Such fractures are typically accompanied by a sacral fracture; there is  some vertical sclerosis in the left sacral ala on image 93/4 which may represent a nondisplaced sacral fracture although this is a subtle finding. Lumbar spondylosis and degenerative disc disease noted with borderline left foraminal stenosis at L3-4. Grade 1 degenerative retrolisthesis at L2-3 and L3-4. IMPRESSION: 1. Subacute healing fractures of the left inferior and superior pubic ramus, and probably nondisplaced healing fracture of the left sacral ala. Correlate with history of trauma. 2. Formed stool in the colon, no dilated bowel or specific abnormal bowel wall thickening. 3. Mild cardiomegaly with right coronary artery atherosclerosis. 4. Probable early cirrhosis. Chronic prominence of the portal vein tributaries, portal vein, and splenic vein. Splenomegaly with splenic volume 590 cubic cm. 5. Nonobstructive left nephrolithiasis. 6. Benign hepatic and renal cysts. 7.  Aortic Atherosclerosis (ICD10-I70.0). Electronically Signed   By: Van Clines M.D.   On: 06/25/2018 18:15   Dg Chest 2 View  Result Date: 06/25/2018 CLINICAL DATA:  Weakness and nausea EXAM: CHEST - 2 VIEW COMPARISON:  06/09/2010 FINDINGS: Normal cardiac silhouette ectatic aorta. Central venous congestion increased from prior. No focal consolidation. No pleural fluid. No pneumothorax. No acute osseous abnormality. IMPRESSION: Increased central venous congestion. Electronically Signed   By: Suzy Bouchard M.D.   On: 06/25/2018 15:06   Ct Head Wo Contrast  Result Date:  06/25/2018 CLINICAL DATA:  Head trauma, weakness, fell 2 weeks ago, follow-up EXAM: CT HEAD WITHOUT CONTRAST TECHNIQUE: Contiguous axial images were obtained from the base of the skull through the vertex without intravenous contrast. Sagittal and coronal MPR images reconstructed from axial data set. COMPARISON:  02/02/2017 FINDINGS: Brain: Mild atrophy. Normal ventricular morphology. No midline shift or mass effect. Otherwise normal appearance of brain parenchyma. No intracranial hemorrhage, mass lesion or evidence of acute infarction. No extra-axial fluid collections. Vascular: No hyperdense vessels Skull: Demineralized but intact Sinuses/Orbits: Chronic opacification of RIGHT maxillary sinus. Remaining visualized paranasal sinuses and mastoid air cells clear. Other: N/A IMPRESSION: No acute intracranial abnormalities. Chronic RIGHT maxillary sinus disease. Electronically Signed   By: Lavonia Dana M.D.   On: 06/25/2018 15:46      Assessment/Plan Principal Problem:   Sepsis ([place]) Active Problems:   Elevated liver function tests   Chronic leukopenia   Acute renal failure (HCC)   Nausea & vomiting   GAD (generalized anxiety disorder)   UTI (urinary tract infection)     #1 early sepsis: Due to UTI.  Patient will be admitted and initiated on IV Rocephin and IV fluids.  Supportive care and follow culture and sensitivity results.  #2 UTI: Possibly cystitis.  Mild flank pain.  Await urine and blood culture results.  Continue empiric Rocephin.  #3 chronic pancytopenia: Platelets currently 87.  No active bleeding.  Continue outpatient follow-up and monitor white count and platelets in the hospital  #4 nausea with vomiting: Most likely due to the UTI.  I suspect [place] in this case.  Continue empiric treatment with Zofran and supportive care  #5 generalized anxiety disorder: Continue home regimen.  #6 increased LFTs:CT abdomen pelvis showed probably early liver cirrhosis.  Patient denied being  alcoholic.  May require work-up including acute hepatitis panel.  Referral to outpatient.  #7 multiple pubic fractures: Appear to be nonacute.  Subacute to chronic.  Patient has had previous falls which may have explained these.  Not in pain at the moment.  Will benefit from physical therapy evaluation.   DVT prophylaxis: Lovenox Code Status: Full code Family Communication: No family around care discussed with patient  Disposition Plan: To be determined Consults called: None Admission status: Inpatient  Severity of Illness: The appropriate patient status for this patient is INPATIENT. Inpatient status is judged to be reasonable and necessary in order to provide the required intensity of service to ensure the patient's safety. The patient's presenting symptoms, physical exam findings, and initial radiographic and laboratory data in the context of their chronic comorbidities is felt to place them at high risk for further clinical deterioration. Furthermore, it is not anticipated that the patient will be medically stable for discharge from the hospital within 2 midnights of admission. The following factors support the patient status of inpatient.   " The patient's presenting symptoms include generalized weakness nausea with vomiting. " The worrisome physical exam findings include chronically ill looking and debilitation. " The initial radiographic and laboratory data are worrisome because of evidence of UTI with multiple.  Fractures. " The chronic co-morbidities include hypertension.   * I certify that at the point of admission it is my clinical judgment that the patient will require inpatient hospital care spanning beyond 2 midnights from the point of admission due to high intensity of service, high risk for further deterioration and high frequency of surveillance required.Barbette Merino MD [facility] Pager [phone]  If 7PM-7AM, please contact  night-coverage [email or web address] Password [facility]  06/25/2018, 7:46 PM

## 2018-06-25 NOTE — ED Notes (Signed)
Date and time results received: 06/25/18 1507 (use smartphrase ".now" to insert current time)  Test: Troponin Critical Value: 0.03  Name of Provider Notified: Dr. Thurnell Garbe  Orders Received? Or Actions Taken?:

## 2018-06-26 ENCOUNTER — Encounter (HOSPITAL_COMMUNITY): Payer: Self-pay

## 2018-06-26 DIAGNOSIS — N179 Acute kidney failure, unspecified: Secondary | ICD-10-CM

## 2018-06-26 DIAGNOSIS — F411 Generalized anxiety disorder: Secondary | ICD-10-CM

## 2018-06-26 LAB — COMPREHENSIVE METABOLIC PANEL
ALT: 22 U/L (ref 0–44)
AST: 47 U/L — ABNORMAL HIGH (ref 15–41)
Albumin: 2.4 g/dL — ABNORMAL LOW (ref 3.5–5.0)
Alkaline Phosphatase: 130 U/L — ABNORMAL HIGH (ref 38–126)
Anion gap: 4 — ABNORMAL LOW (ref 5–15)
BUN: 13 mg/dL (ref 8–23)
CO2: 16 mmol/L — ABNORMAL LOW (ref 22–32)
Calcium: 9.3 mg/dL (ref 8.9–10.3)
Chloride: 117 mmol/L — ABNORMAL HIGH (ref 98–111)
Creatinine, Ser: 1.29 mg/dL — ABNORMAL HIGH (ref 0.44–1.00)
GFR calc Af Amer: 51 mL/min — ABNORMAL LOW (ref >60)
GFR calc non Af Amer: 44 mL/min — ABNORMAL LOW (ref >60)
Glucose, Bld: 110 mg/dL — ABNORMAL HIGH (ref 70–99)
Potassium: 3.2 mmol/L — ABNORMAL LOW (ref 3.5–5.1)
Sodium: 137 mmol/L (ref 135–145)
Total Bilirubin: 2.8 mg/dL — ABNORMAL HIGH (ref 0.3–1.2)
Total Protein: 5.3 g/dL — ABNORMAL LOW (ref 6.5–8.1)

## 2018-06-26 LAB — CBC
HCT: 26.2 % — ABNORMAL LOW (ref 36.0–46.0)
Hemoglobin: 8.1 g/dL — ABNORMAL LOW (ref 12.0–15.0)
MCH: 31.5 pg (ref 26.0–34.0)
MCHC: 30.9 g/dL (ref 30.0–36.0)
MCV: 101.9 fL — ABNORMAL HIGH (ref 80.0–100.0)
Platelets: 58 10*3/uL — ABNORMAL LOW (ref 150–400)
RBC: 2.57 MIL/uL — ABNORMAL LOW (ref 3.87–5.11)
RDW: 15.9 % — ABNORMAL HIGH (ref 11.5–15.5)
WBC: 6.5 10*3/uL (ref 4.0–10.5)
nRBC: 0 % (ref 0.0–0.2)

## 2018-06-26 MED ORDER — ADULT MULTIVITAMIN W/MINERALS CH
1.0000 | ORAL_TABLET | Freq: Every day | ORAL | Status: DC
  Administered 2018-06-26 – 2018-06-28 (×3): 1 via ORAL
  Filled 2018-06-26 (×3): qty 1

## 2018-06-26 MED ORDER — LORAZEPAM 1 MG PO TABS
0.0000 mg | ORAL_TABLET | Freq: Four times a day (QID) | ORAL | Status: AC
  Filled 2018-06-26: qty 1

## 2018-06-26 MED ORDER — VITAMIN B-1 100 MG PO TABS
100.0000 mg | ORAL_TABLET | Freq: Every day | ORAL | Status: DC
  Administered 2018-06-26 – 2018-06-28 (×3): 100 mg via ORAL
  Filled 2018-06-26 (×3): qty 1

## 2018-06-26 MED ORDER — THIAMINE HCL 100 MG/ML IJ SOLN: 100.0000 mg | Freq: Every day | INTRAMUSCULAR | Status: DC

## 2018-06-26 MED ORDER — LORAZEPAM 1 MG PO TABS: 1.0000 mg | ORAL_TABLET | Freq: Four times a day (QID) | ORAL | Status: DC | PRN

## 2018-06-26 MED ORDER — ACETAMINOPHEN 325 MG PO TABS: 650.0000 mg | ORAL_TABLET | Freq: Four times a day (QID) | ORAL | Status: DC | PRN

## 2018-06-26 MED ORDER — FOLIC ACID 1 MG PO TABS
1.0000 mg | ORAL_TABLET | Freq: Every day | ORAL | Status: DC
  Administered 2018-06-26 – 2018-06-28 (×3): 1 mg via ORAL
  Filled 2018-06-26 (×3): qty 1

## 2018-06-26 MED ORDER — LORAZEPAM 2 MG/ML IJ SOLN: 1.0000 mg | Freq: Four times a day (QID) | INTRAMUSCULAR | Status: DC | PRN

## 2018-06-26 MED ORDER — LORAZEPAM 1 MG PO TABS: 0.0000 mg | ORAL_TABLET | Freq: Two times a day (BID) | ORAL | Status: DC

## 2018-06-26 NOTE — Progress Notes (Signed)
PROGRESS NOTE    MIKAYLAH Watkins  DGU:[id number] DOB: [birth year] DOA: 06/25/2018 PCP: Lawerance Cruel, MD    Brief Narrative:   Dana Watkins is a 63 y.o. female with medical history significant of Hypertension, C. difficile colitis, coronary artery disease, pancytopenia who came to the ER complaining of generalized weakness and nausea.  She was apparently recently treated for pneumonia by her PCP about a week ago.  She has continued to have significant nausea with her last dose of antibiotics supposed to be today.  No active diarrhea but she has previously been treated for C. difficile.  She is getting intermittent nausea vomiting with diarrhea but today is been more persistent.  Denied any shortness of breath or cough but had a fever up to 101 at home.  She denied any new sick contacts.  Patient was seen and evaluated and found to have evidence of UTI and parameters for SIRS.  She is therefore being admitted with early sepsis due to UTI..  Temperature is 100.8 with blood pressure 144/78, pulse 91 respiratory 25 and oxygen sat 97% room air.  White count is 11,000 hemoglobin 9.7 and platelets 87.  Sodium 140 potassium 3.5 chloride 116 CO2 of 17 with creatinine 1.38 and glucose 106.  Lactic acid is 1.2. Troponin 0 0.03. Urinalysis showed positive nitrite WBC 21-50 with bacteria.  Urine drug screen is positive for benzodiazepines.  Head CT without contrast is negative CT abdomen pelvis showed no active disease.  Patient is therefore being admitted with UTI leading to early sepsis.   Assessment & Plan:   Principal Problem:   Sepsis ([place]) Active Problems:   Elevated liver function tests   Chronic leukopenia   Acute renal failure (HCC)   Nausea & vomiting   Benign essential hypertension   GAD (generalized anxiety disorder)   UTI (urinary tract infection)   Sepsis Acute cystitis without hematuria Patient presenting from home with progressive nausea/vomiting, weakness and  fever.  Recently treated by PCP for pneumonia with azithromycin.  History of C. difficile colitis January 2019.  Urinalysis notable for small leukoesterase, positive nitrite, few bacteria and 21-50 WBCs.  T-max on admission 100.8.  COVID-19/SARS-CoV-2 negative.  Chest x-ray with mild central vascular congestion. --Urine culture pending --continiue ceftriaxone --Supportive care, antiemetics, antipyretics  Transaminitis Early cirrhosis AST 63, ALT 27, total bilirubin 3.6, alkaline phosphatase 169 on admission.  CT abdomen/pelvis notable for morphologic findings in the liver with mild prominence of the lateral segment of the left hepatic lobe and caudate lobe concern for possible early cirrhosis.  Gallbladder normal in appearance.  EtOH level on admission less than 10.  Suspect possible underlying EtOH abuse, especially in the setting of her current home situation with divorce. --Continue to trend CMP, LFT's improving --CIWAA protocol --Outpatient follow-up  Acute renal failure Creatinine 1.38 on admission.  Baseline 0.90 -1.25 for the past year.  Etiology likely underlying sepsis with urinary tract infection as above.  Received IV fluid hydration on admission. --Continue antibiotics as above --Avoid antiemetics,  Chronic pancytopenia Chronic anemia Follows with hematology, Dr. Marin Olp outpatient. Bone Marrow biopsy 2016 no evidence of myelodysplasia.  hemoglobin 8.1, MCV elevated at 101.9. --Platelet count 87-->58 --Discontinue Lovenox in favor of SCDs --Recheck B12 and folate --Follow CBC/platelet count daily --Outpatient follow-up  Subacute pelvic fractures Appear to be nonacute.  Subacute to chronic.  Patient has had previous falls which may have explained these.    Currently pain-free with normal  passive/active range of motion. --PT evaluation    DVT prophylaxis: Lovenox Code Status: Full code Family Communication: None Disposition Plan: Inpatient, PT evaluation, likely home in  1-2 days   Consultants:   None  Procedures:   None  Antimicrobials:   Vancomycin x 1 dose in ED 6/14  Cefepime x 1 dose in ED 6/14  Flagyl x 1 dose in ED 6/14  Ceftriaxone 6/14>>   Subjective: Patient seen and examined at bedside, resting comfortably in bed.  Continues with weakness and fatigue.  Feels slightly better than yesterday upon admission.  Continues with some mild dysuria, mild headache.  Somewhat emotional as she is currently going through a divorce.  No other complaints at this time.  Denies visual changes, no fever/chill/night sweats, no chest pain, palpitations, no shortness of breath, no abdominal pain, no diarrhea, no cough/congestion, no paresthesias.  No acute events overnight per nursing staff.  Objective: Vitals:   06/25/18 1900 06/25/18 1948 06/25/18 2020 06/26/18 0413  BP: 117/69 119/74  (!) 144/70  Pulse: 71 70  86  Resp: (!) _0 Temp:  98.6 F (37 C)  100.1 F (37.8 C)  TempSrc:  Oral  Oral  SpO2: 97% 100%  100%  Weight:   61 kg   Height:   _1  (1.727 m)     Intake/Output Summary (Last 24 hours) at 06/26/2018 1051 Last data filed at 06/26/2018 0845 Gross per 24 hour  Intake 1705.94 ml  Output -  Net 1705.94 ml   Filed Weights   06/25/18 1159 06/25/18 2020  Weight: 59 kg 61 kg    Examination:  General exam: Appears calm and comfortable, mildly anxious Respiratory system: Clear to auscultation. Respiratory effort normal.  Oxygenating well on room air Cardiovascular system: S1 & S2 heard, RRR. No JVD, murmurs, rubs, gallops or clicks. No pedal edema. Gastrointestinal system: Abdomen is nondistended, soft and nontender. No organomegaly or masses felt. Normal bowel sounds heard. Central nervous system: Alert and oriented. No focal neurological deficits. Extremities: Symmetric 5 x 5 power. Skin: No rashes, lesions or ulcers Psychiatry: Judgement and insight appear normal.  Slightly anxious mood, normal affect    Data Reviewed: I  have personally reviewed following labs and imaging studies  CBC: Recent Labs  Lab 06/25/18 1315 06/26/18 0516  WBC 11.0* 6.5  NEUTROABS 9.5*  --   HGB 9.7* 8.1*  HCT 30.4* 26.2*  MCV 100.7* 101.9*  PLT 87* 58*   Basic Metabolic Panel: Recent Labs  Lab 06/25/18 1315 06/26/18 0516  NA 140 137  K 3.5 3.2*  CL 116* 117*  CO2 17* 16*  GLUCOSE 106* 110*  BUN 14 13  CREATININE 1.38* 1.29*  CALCIUM 9.5 9.3   GFR: Estimated Creatinine Clearance: 43.5 mL/min (A) (by C-G formula based on SCr of 1.29 mg/dL (H)). Liver Function Tests: Recent Labs  Lab 06/25/18 1315 06/26/18 0516  AST 63* 47*  ALT 27 22  ALKPHOS 169* 130*  BILITOT 3.6* 2.8*  PROT 6.7 5.3*  ALBUMIN 3.1* 2.4*   Recent Labs  Lab 06/25/18 1315  LIPASE 36   Recent Labs  Lab 06/25/18 1642  AMMONIA 42*   Coagulation Profile: No results for input(s): INR, PROTIME in the last 168 hours. Cardiac Enzymes: Recent Labs  Lab 06/25/18 1315  TROPONINI 0.03*   BNP (last 3 results) No results for input(s): PROBNP in the last 8760 hours. HbA1C: No results for input(s): HGBA1C in the last 72 hours. CBG: No results  for input(s): GLUCAP in the last 168 hours. Lipid Profile: No results for input(s): CHOL, HDL, LDLCALC, TRIG, CHOLHDL, LDLDIRECT in the last 72 hours. Thyroid Function Tests: No results for input(s): TSH, T4TOTAL, FREET4, T3FREE, THYROIDAB in the last 72 hours. Anemia Panel: No results for input(s): VITAMINB12, FOLATE, FERRITIN, TIBC, IRON, RETICCTPCT in the last 72 hours. Sepsis Labs: Recent Labs  Lab 06/25/18 1429 06/25/18 1642  LATICACIDVEN 1.5 1.2    Recent Results (from the past 240 hour(s))  Blood Culture (routine x 2)     Status: None (Preliminary result)   Collection Time: 06/25/18  1:15 PM   Specimen: BLOOD  Result Value Ref Range Status   Specimen Description   Final    BLOOD BLOOD RIGHT FOREARM Performed at [facility] [address] [place], [place]  [address]    Special Requests   Final    BOTTLES DRAWN AEROBIC AND ANAEROBIC Blood Culture adequate volume Performed at [facility] [address] [place], [place] [address]    Culture   Final    NO GROWTH < 12 HOURS Performed at [facility] [address], [place], [place] [address]    Report Status PENDING  Incomplete  SARS Coronavirus 2 (CEPHEID - Performed in [facility] hospital lab), Hosp Order     Status: None   Collection Time: 06/25/18  2:29 PM   Specimen: Nasopharyngeal Swab  Result Value Ref Range Status   SARS Coronavirus 2 NEGATIVE NEGATIVE Final    Comment: (NOTE) If result is NEGATIVE SARS-CoV-2 target nucleic acids are NOT DETECTED. The SARS-CoV-2 RNA is generally detectable in upper and lower  respiratory specimens during the acute phase of infection. The lowest  concentration of SARS-CoV-2 viral copies this assay can detect is 250  copies / mL. A negative result does not preclude SARS-CoV-2 infection  and should not be used as the sole basis for treatment or other  patient management decisions.  A negative result may occur with  improper specimen collection / handling, submission of specimen other  than nasopharyngeal swab, presence of viral mutation(s) within the  areas targeted by this assay, and inadequate number of viral copies  (<250 copies / mL). A negative result must be combined with clinical  observations, patient history, and epidemiological information. If result is POSITIVE SARS-CoV-2 target nucleic acids are DETECTED. The SARS-CoV-2 RNA is generally detectable in upper and lower  respiratory specimens dur ing the acute phase of infection.  Positive  results are indicative of active infection with SARS-CoV-2.  Clinical  correlation with patient history and other diagnostic information is  necessary to determine patient infection status.  Positive results do  not rule out bacterial infection or co-infection with other  viruses. If result is PRESUMPTIVE POSTIVE SARS-CoV-2 nucleic acids MAY BE PRESENT.   A presumptive positive result was obtained on the submitted specimen  and confirmed on repeat testing.  While 2019 novel coronavirus  (SARS-CoV-2) nucleic acids may be present in the submitted sample  additional confirmatory testing may be necessary for epidemiological  and / or clinical management purposes  to differentiate between  SARS-CoV-2 and other Sarbecovirus currently known to infect humans.  If clinically indicated additional testing with an alternate test  methodology [id number]) is advised. The SARS-CoV-2 RNA is generally  detectable in upper and lower respiratory sp ecimens during the acute  phase of infection. The expected result is Negative. Fact Sheet for Patients:  StrictlyIdeas.no Fact Sheet for Healthcare Providers:  BankingDealers.co.za This test is not yet approved or cleared by the [place] and has been authorized for detection and/or diagnosis of SARS-CoV-2 by FDA under an Emergency Use Authorization (EUA).  This EUA will remain in effect (meaning this test can be used) for the duration of the COVID-19 declaration under Section 564(b)(1) of the Act, 21 U.S.C. section 360bbb-3(b)(1), unless the authorization is terminated or revoked sooner. Performed at [facility], [place] [address] [place], [place] [address]          Radiology Studies: Ct Abdomen Pelvis Wo Contrast  Result Date: 06/25/2018 CLINICAL DATA:  Abnormal liver function test. Diarrhea. Nausea and vomiting. EXAM: CT ABDOMEN AND PELVIS WITHOUT CONTRAST TECHNIQUE: Multidetector CT imaging of the abdomen and pelvis was performed following the standard protocol without IV contrast. COMPARISON:  Multiple exams, including MRI abdomen from 05/29/2013 and abdominal ultrasound from 07/03/2014 FINDINGS: Lower chest: Posterior basal segment lower lobe  scarring or atelectasis bilaterally. Mild cardiomegaly. Right coronary artery atherosclerotic calcification. Hepatobiliary: Stable somewhat prominent caliber of the portal venous tributaries, portal vein, and splenic vein, not appreciably changed compared to the prior MRI from 05/29/2013. 1.2 cm by 0.9 cm fluid density lesion in segment 2 of the liver on image 17/2, this cyst previously measured 0.6 cm in diameter on 05/29/2013. Morphologic findings in liver such as mild prominence of the lateral segment left hepatic lobe and caudate lobe raise the possibility of early cirrhosis. Gallbladder unremarkable. Pancreas: Unremarkable Spleen: The spleen measures 14.5 by 6.6 by 11.7 cm (volume = 590 cm^3). Adrenals/Urinary Tract: The adrenal glands appear normal. There is a 5.5 by 4.6 cm right kidney upper pole cyst, this measured 5.0 by 4.0 cm on 05/29/2013. 3 mm left mid kidney nonobstructive renal calculus on image 85/4. 3 mm left kidney lower pole nonobstructive calculus, image 84/4. Small hypodense lesions of the left mid kidney and left kidney lower pole are compatible with cysts and were shown on 05/29/2013. No hydronephrosis or hydroureter. Urinary bladder appears unremarkable. Stomach/Bowel: There is formed stool in the distal colon and rectum. No dilated bowel. Normal appendix. Vascular/Lymphatic: Aortoiliac atherosclerotic vascular disease. Small gastrohepatic ligament lymph nodes are present. No overtly pathologic adenopathy. Reproductive: Unremarkable Other: No supplemental non-categorized findings. Musculoskeletal: Subacute fractures of the left inferior pubic ramus and of the lateral portion of the left superior pubic ramus. These fractures show some early healing response but have not fused. Such fractures are typically accompanied by a sacral fracture; there is some vertical sclerosis in the left sacral ala on image 93/4 which may represent a nondisplaced sacral fracture although this is a subtle finding.  Lumbar spondylosis and degenerative disc disease noted with borderline left foraminal stenosis at L3-4. Grade 1 degenerative retrolisthesis at L2-3 and L3-4. IMPRESSION: 1. Subacute healing fractures of the left inferior and superior pubic ramus, and probably nondisplaced healing fracture of the left sacral ala. Correlate with history of trauma. 2. Formed stool in the colon, no dilated bowel or specific abnormal bowel wall thickening. 3. Mild cardiomegaly with right coronary artery atherosclerosis. 4. Probable early cirrhosis. Chronic prominence of the portal vein tributaries, portal vein, and splenic vein. Splenomegaly with splenic volume 590 cubic cm. 5. Nonobstructive left nephrolithiasis. 6. Benign hepatic and renal cysts. 7.  Aortic Atherosclerosis (ICD10-I70.0). Electronically Signed   By: Van Clines M.D.   On: 06/25/2018 18:15   Dg Chest 2 View  Result Date: 06/25/2018 CLINICAL DATA:  Weakness and nausea EXAM: CHEST - 2 VIEW COMPARISON:  06/09/2010  FINDINGS: Normal cardiac silhouette ectatic aorta. Central venous congestion increased from prior. No focal consolidation. No pleural fluid. No pneumothorax. No acute osseous abnormality. IMPRESSION: Increased central venous congestion. Electronically Signed   By: Suzy Bouchard M.D.   On: 06/25/2018 15:06   Ct Head Wo Contrast  Result Date: 06/25/2018 CLINICAL DATA:  Head trauma, weakness, fell 2 weeks ago, follow-up EXAM: CT HEAD WITHOUT CONTRAST TECHNIQUE: Contiguous axial images were obtained from the base of the skull through the vertex without intravenous contrast. Sagittal and coronal MPR images reconstructed from axial data set. COMPARISON:  02/02/2017 FINDINGS: Brain: Mild atrophy. Normal ventricular morphology. No midline shift or mass effect. Otherwise normal appearance of brain parenchyma. No intracranial hemorrhage, mass lesion or evidence of acute infarction. No extra-axial fluid collections. Vascular: No hyperdense vessels Skull:  Demineralized but intact Sinuses/Orbits: Chronic opacification of RIGHT maxillary sinus. Remaining visualized paranasal sinuses and mastoid air cells clear. Other: N/A IMPRESSION: No acute intracranial abnormalities. Chronic RIGHT maxillary sinus disease. Electronically Signed   By: Lavonia Dana M.D.   On: 06/25/2018 15:46        Scheduled Meds: . cholecalciferol  1,000 Units Oral Daily  . folic acid  1 mg Oral Daily  . LORazepam  0-4 mg Oral Q6H   Followed by  . [START ON 06/28/2018] LORazepam  0-4 mg Oral Q12H  . multivitamin with minerals  1 tablet Oral Daily  . potassium chloride SA  40 mEq Oral BID  . thiamine  100 mg Oral Daily   Or  . thiamine  100 mg Intravenous Daily   Continuous Infusions: . cefTRIAXone (ROCEPHIN)  IV Stopped (06/25/18 2150)  . dextrose 5 % and 0.9% NaCl 125 mL/hr at 06/26/18 0400     LOS: 1 day    Time spent: 29 minutes     J [place] DO [facility] Pager [phone]  If 7PM-7AM, please contact night-coverage [email or web address] Password [facility] 06/26/2018, 10:51 AM

## 2018-06-26 NOTE — Evaluation (Signed)
Physical Therapy Evaluation Patient Details Name: Dana Watkins MRN: [id number] DOB: 1955/02/17 Today's Date: 06/26/2018   History of Present Illness  63 yo female admitted to ED on 6/14 with PNa, weakness, UTI sepsis. CT reveals early liver cirrhosis (alcohol abuse suspected), subacute-chronic pubic fractures secondary to pt reports of falls. PMH includes HTN, cdiff, CAD with coronary angiography, pancytopenia, CKDIII, migraines.  Clinical Impression   Pt presents with deconditioning, ataxic-like gait, difficulty performing transfers and ambulation due to unsteadiness, mild dyspnea on exertion, and decreased activity tolerance. Pt to benefit from acute PT to address deficits. Pt ambulated 2x120 ft with unsteady gait, ambulating somewhat better with RW use but requires multiple verbal/tactile cues for safety with RW. Pt requires min assist for transfers and ambulation at this time due to unsteadiness in standing, pt lacking awareness of deficits and states if she is unsteady pt states "it's because I haven't eaten in 5 days". PT recommending HHPT with d/c with sister support 24/7, which pt states is possible. Pt states she may just d/c to her sister's house initially. PT to progress mobility as tolerated, and will continue to follow acutely.      Follow Up Recommendations Home health PT;Supervision/Assistance - 24 hour    Equipment Recommendations  Rolling walker with 5" wheels    Recommendations for Other Services       Precautions / Restrictions Precautions Precautions: Fall Restrictions Weight Bearing Restrictions: No      Mobility  Bed Mobility Overal bed mobility: Needs Assistance Bed Mobility: Supine to Sit;Sit to Supine     Supine to sit: Supervision Sit to supine: Supervision   General bed mobility comments: supervision for safety, increased time to perform.  Transfers Overall transfer level: Needs assistance Equipment used: Rolling walker (2  wheeled);None Transfers: Sit to/from Stand Sit to Stand: Min assist         General transfer comment: Min assist x2, once with and once without RW, for steadying, initial power up. Verbal cuing for hand placement with RW use.  Ambulation/Gait Ambulation/Gait assistance: Min assist Gait Distance (Feet): 120 Feet(2x120 ft) Assistive device: Rolling walker (2 wheeled);IV Pole Gait Pattern/deviations: Step-through pattern;Narrow base of support;Wide base of support;Ataxic;Scissoring;Drifts right/left Gait velocity: decr   General Gait Details: Pt ambulated first 120 ft with IV for steadying, but pt very unsteady with scissoring of gait and running into IV pole with LEs. PT gave pt RW to use, still required min assist for steadying pt and RW, placement in RW. Pt with no safety awareness, periodically lifting RW up with posterior leaning. DOE 2/4 during ambulation.  Stairs            Wheelchair Mobility    Modified Rankin (Stroke Patients Only)       Balance Overall balance assessment: Needs assistance;History of Falls(reports 3 falls in the past year, states there is no particular reason or situation in which she falls) Sitting-balance support: No upper extremity supported;Feet supported Sitting balance-Leahy Scale: Good     Standing balance support: Single extremity supported;During functional activity Standing balance-Leahy Scale: Poor Standing balance comment: very unsteady, ataxic-like gait requiring min assist for stability                             Pertinent Vitals/Pain Pain Assessment: No/denies pain    Home Living Family/patient expects to be discharged to:: Private residence Living Arrangements: Alone(Pt undergoing a divorce currently, states living arrangement is relatively new) Available Help at  Discharge: Family;Available PRN/intermittently Type of Home: Apartment Home Access: Stairs to enter Entrance Stairs-Rails: Counsellor of Steps: 19 Home Layout: One level Home Equipment: None      Prior Function Level of Independence: Independent         Comments: Pt states her parents, sisters would occasionally drop off food for pt PTA, but did not require assist.     Hand Dominance   Dominant Hand: Right    Extremity/Trunk Assessment   Upper Extremity Assessment Upper Extremity Assessment: Overall WFL for tasks assessed    Lower Extremity Assessment Lower Extremity Assessment: Generalized weakness    Cervical / Trunk Assessment Cervical / Trunk Assessment: Normal  Communication   Communication: No difficulties  Cognition Arousal/Alertness: Awake/alert Behavior During Therapy: WFL for tasks assessed/performed Overall Cognitive Status: Impaired/Different from baseline Area of Impairment: Safety/judgement;Problem solving                         Safety/Judgement: Decreased awareness of deficits;Decreased awareness of safety   Problem Solving: Requires verbal cues;Requires tactile cues General Comments: Pt very unsteady but lacking awareness of this until pointed out by PT. Pt appearing somewhat emotionally labile, laughing frequently for no apparent reason.      General Comments General comments (skin integrity, edema, etc.): Pt with 2 skin tears on L lower leg due to recent fall, heavy bruising on R and LUE which pt states is due to IV placement.    Exercises     Assessment/Plan    PT Assessment Patient needs continued PT services  PT Problem List Decreased strength;Decreased mobility;Decreased safety awareness;Decreased activity tolerance;Decreased balance;Decreased knowledge of use of DME;Decreased cognition       PT Treatment Interventions DME instruction;Therapeutic activities;Gait training;Therapeutic exercise;Patient/family education;Balance training;Stair training;Functional mobility training    PT Goals (Current goals can be found in the Care Plan section)   Acute Rehab PT Goals Patient Stated Goal: get stronger PT Goal Formulation: With patient Time For Goal Achievement: 07/10/18 Potential to Achieve Goals: Good    Frequency Min 3X/week   Barriers to discharge        Co-evaluation               AM-PAC PT "6 Clicks" Mobility  Outcome Measure Help needed turning from your back to your side while in a flat bed without using bedrails?: A Little Help needed moving from lying on your back to sitting on the side of a flat bed without using bedrails?: A Little Help needed moving to and from a bed to a chair (including a wheelchair)?: A Little Help needed standing up from a chair using your arms (e.g., wheelchair or bedside chair)?: A Little Help needed to walk in hospital room?: A Little Help needed climbing 3-5 steps with a railing? : A Lot 6 Click Score: 17    End of Session Equipment Utilized During Treatment: Gait belt Activity Tolerance: Patient tolerated treatment well;Patient limited by fatigue Patient left: in bed;with bed alarm set;with call bell/phone within reach Nurse Communication: Mobility status PT Visit Diagnosis: Unsteadiness on feet (R26.81);History of falling (Z91.81);Difficulty in walking, not elsewhere classified (R26.2)    Time: 1230-1250 PT Time Calculation (min) (ACUTE ONLY): 20 min   Charges:   PT Evaluation $PT Eval Low Complexity: 1 Low          Johnaton Sonneborn Conception Chancy, PT Acute Rehabilitation Services Pager [phone]  Office [phone]   Cherie Lasalle D Elonda Husky 06/26/2018, 1:08 PM

## 2018-06-27 DIAGNOSIS — R6520 Severe sepsis without septic shock: Secondary | ICD-10-CM

## 2018-06-27 LAB — CBC
HCT: 25.8 % — ABNORMAL LOW (ref 36.0–46.0)
Hemoglobin: 7.9 g/dL — ABNORMAL LOW (ref 12.0–15.0)
MCH: 31 pg (ref 26.0–34.0)
MCHC: 30.6 g/dL (ref 30.0–36.0)
MCV: 101.2 fL — ABNORMAL HIGH (ref 80.0–100.0)
Platelets: 60 10*3/uL — ABNORMAL LOW (ref 150–400)
RBC: 2.55 MIL/uL — ABNORMAL LOW (ref 3.87–5.11)
RDW: 16.2 % — ABNORMAL HIGH (ref 11.5–15.5)
WBC: 4.8 10*3/uL (ref 4.0–10.5)
nRBC: 0 % (ref 0.0–0.2)

## 2018-06-27 LAB — COMPREHENSIVE METABOLIC PANEL
ALT: 21 U/L (ref 0–44)
AST: 45 U/L — ABNORMAL HIGH (ref 15–41)
Albumin: 2.4 g/dL — ABNORMAL LOW (ref 3.5–5.0)
Alkaline Phosphatase: 146 U/L — ABNORMAL HIGH (ref 38–126)
Anion gap: 7 (ref 5–15)
BUN: 10 mg/dL (ref 8–23)
CO2: 16 mmol/L — ABNORMAL LOW (ref 22–32)
Calcium: 9 mg/dL (ref 8.9–10.3)
Chloride: 118 mmol/L — ABNORMAL HIGH (ref 98–111)
Creatinine, Ser: 1.27 mg/dL — ABNORMAL HIGH (ref 0.44–1.00)
GFR calc Af Amer: 52 mL/min — ABNORMAL LOW (ref >60)
GFR calc non Af Amer: 45 mL/min — ABNORMAL LOW (ref >60)
Glucose, Bld: 110 mg/dL — ABNORMAL HIGH (ref 70–99)
Potassium: 3.5 mmol/L (ref 3.5–5.1)
Sodium: 141 mmol/L (ref 135–145)
Total Bilirubin: 1.7 mg/dL — ABNORMAL HIGH (ref 0.3–1.2)
Total Protein: 5.2 g/dL — ABNORMAL LOW (ref 6.5–8.1)

## 2018-06-27 LAB — URINE CULTURE: Culture: NO GROWTH

## 2018-06-27 LAB — HEPATITIS PANEL, ACUTE
HCV Ab: <0.1 s/co ratio (ref 0.0–0.9)
Hep A IgM: NEGATIVE
Hep B C IgM: NEGATIVE
Hepatitis B Surface Ag: NEGATIVE

## 2018-06-27 LAB — MAGNESIUM: Magnesium: 2.3 mg/dL (ref 1.7–2.4)

## 2018-06-27 MED ORDER — CEPHALEXIN 500 MG PO CAPS: 500.0000 mg | ORAL_CAPSULE | Freq: Two times a day (BID) | ORAL | 0 refills | Status: DC

## 2018-06-27 NOTE — TOC Initial Note (Signed)
Transition of Care [facility]) - Initial/Assessment Note    Patient Details  Name: Dana Watkins MRN: [id number] Date of Birth: [birth year]  Transition of Care [facility]) CM/SW Contact:    Dessa Phi, RN Phone Number: 06/27/2018, 11:35 AM  Clinical Narrative: Patient states she is recently divorced-provided emotional support-will stay w/parents @ d/c [place]. PT recc HHPT,rw-patient pleasantly declines HHC, & rw(states her ex husband has rw,& her father has rw). Her parents will transport home @ d/c. No further CM needs.                  Expected Discharge Plan: [place] Barriers to Discharge: Continued Medical Work up   Patient Goals and CMS Choice Patient states their goals for this hospitalization and ongoing recovery are:: go home CMS Medicare.gov Compare Post Acute Care list provided to:: Patient Choice offered to / list presented to : Patient  Expected Discharge Plan and Services Expected Discharge Plan: [place]   Discharge Planning Services: CM Consult   Living arrangements for the past 2 months: Single Family Home Expected Discharge Date: 06/27/18                         HH Arranged: Patient Refused HH          Prior Living Arrangements/Services Living arrangements for the past 2 months: Single Family Home Lives with:: Self Patient language and need for interpreter reviewed:: Yes Do you feel safe going back to the place where you live?: No      Need for Family Participation in Patient Care: No (Comment) Care giver support system in place?: Yes (comment)   Criminal Activity/Legal Involvement Pertinent to Current Situation/Hospitalization: No - Comment as needed  Activities of Daily Living Home Assistive Devices/Equipment: None ADL Screening (condition at time of admission) Patient's cognitive ability adequate to safely complete daily activities?: Yes Is the patient deaf or have difficulty hearing?:  No Does the patient have difficulty seeing, even when wearing glasses/contacts?: No Does the patient have difficulty concentrating, remembering, or making decisions?: No Patient able to express need for assistance with ADLs?: No Does the patient have difficulty dressing or bathing?: No Independently performs ADLs?: Yes (appropriate for developmental age) Does the patient have difficulty walking or climbing stairs?: Yes Weakness of Legs: None Weakness of Arms/Hands: None  Permission Sought/Granted Permission sought to share information with : Case Manager Permission granted to share information with : Yes, Verbal Permission Granted              Emotional Assessment Appearance:: Appears stated age Attitude/Demeanor/Rapport: Gracious Affect (typically observed): Accepting Orientation: : Oriented to Self, Oriented to Place, Oriented to  Time, Oriented to Situation Alcohol / Substance Use: Never Used Psych Involvement: No (comment)  Admission diagnosis:  Elevated LFTs [R94.5] Urinary tract infection without hematuria, site unspecified [N39.0] Sepsis, due to unspecified organism, unspecified whether acute organ dysfunction present [facility]) [A41.9] Patient Active Problem List   Diagnosis Date Noted  . UTI (urinary tract infection) 06/25/2018  . Community acquired pneumonia of right middle lobe of lung ([place]) 06/09/2018  . AKI (acute kidney injury) ([place]) 06/09/2018  . GAD (generalized anxiety disorder) 11/06/2017  . Abnormal findings on diagnostic imaging of heart and coronary circulation   . Family history of coronary artery disease 05/09/2017  . DOE (dyspnea on exertion) 05/09/2017  . Coronary artery calcification seen on CT scan 03/08/2017  . Acute bronchitis 02/06/2017  .  Dehydration   . Diarrhea 02/03/2017  . C. difficile colitis 02/03/2017  . Arthritis 05/15/2015  . Benign essential hypertension 05/15/2015  . Migraine headache 05/15/2015  . Hypokalemia 11/26/2014  . Elevated  alkaline phosphatase level 11/26/2014  . Abnormal weight gain 11/26/2014  . Chronic leukopenia 05/28/2014  . Anemia of chronic illness 05/28/2014  . Thrombocytopenia ([place]) 04/30/2013  . Elevated liver function tests 04/30/2013   PCP:  Lawerance Cruel, MD Pharmacy:   [id number] [place] [id number]: [phone]: [phone] [id number] [id number]  [id number]: [phone]: [phone]   Social Determinants of Health (SDOH) Interventions    Readmission Risk Interventions No flowsheet data found.

## 2018-06-27 NOTE — TOC Progression Note (Signed)
Transition of Care [facility]) - Progression Note    Patient Details  Name: Dana Watkins MRN: [id number] Date of Birth: September 05, 1955  Transition of Care [facility]) CM/SW Contact  Kaleisha Bhargava, Juliann Pulse, RN Phone Number: 06/27/2018, 11:17 AM  Clinical Narrative:   [place] [phone]  [place] my Favorites Quality of Patient Care Rating 3  out of 5 stars Patient Survey Summary Rating 4 out of [place] [phone]  [place] my Favorites Quality of Patient Care Rating 3 out of 5 stars Patient Survey Summary Rating 4 out of 5 stars [facility] [place] [phone]  Add [facility] [place] my Favorites Quality of Patient Care Rating 3  out of 5 stars Patient Survey Summary Rating 4 out of 5 stars [place] [phone]  [place] my Favorites Quality of Patient Care Rating 3 out of 5 stars Patient Survey Summary Rating 4 out of 5 stars [facility] [phone]  Add [facility] my Favorites Quality of Patient Care Rating 3  out of 5 stars Patient Survey Summary Rating 3 out of 5 stars [place] [phone]  Add [facility] Quality of Patient Care Rating 3  out of 5 stars Patient Survey Summary Rating 4 out of 5 stars [place] [phone]  Add [facility] my [facility]avor[facility] Quality of Patient Care Rating          Expected Discharge Plan and Services           Expected Discharge Date: 06/27/18                                     Social Determinants of Health (SDOH) Interventions    Readmission Risk Interventions No flowsheet data found.

## 2018-06-27 NOTE — Progress Notes (Addendum)
PROGRESS NOTE    Dana Watkins  ZOX:[id number] DOB: 09-Feb-1955 DOA: 06/25/2018 PCP: Lawerance Cruel, MD    Brief Narrative:   Dana Watkins is a 63 y.o. female with medical history significant of Hypertension, C. difficile colitis, coronary artery disease, pancytopenia who came to the ER complaining of generalized weakness and nausea.  She was apparently recently treated for pneumonia by her PCP about a week ago.  She has continued to have significant nausea with her last dose of antibiotics supposed to be today.  No active diarrhea but she has previously been treated for C. difficile.  She is getting intermittent nausea vomiting with diarrhea but today is been more persistent.  Denied any shortness of breath or cough but had a fever up to 101 at home.  She denied any new sick contacts.  Patient was seen and evaluated and found to have evidence of UTI and parameters for SIRS.  She is therefore being admitted with early sepsis due to UTI..  Temperature is 100.8 with blood pressure 144/78, pulse 91 respiratory 25 and oxygen sat 97% room air.  White count is 11,000 hemoglobin 9.7 and platelets 87.  Sodium 140 potassium 3.5 chloride 116 CO2 of 17 with creatinine 1.38 and glucose 106.  Lactic acid is 1.2. Troponin 0 0.03. Urinalysis showed positive nitrite WBC 21-50 with bacteria.  Urine drug screen is positive for benzodiazepines.  Head CT without contrast is negative CT abdomen pelvis showed no active disease.  Patient is therefore being admitted with UTI leading to early sepsis.   Assessment & Plan:   Active Problems:   Elevated liver function tests   Chronic leukopenia   Benign essential hypertension   GAD (generalized anxiety disorder)   UTI (urinary tract infection)   Sepsis Acute cystitis without hematuria Patient presenting from home with progressive nausea/vomiting, weakness and fever.  Recently treated by PCP for pneumonia with azithromycin.  History of C.  difficile colitis January 2019.  Urinalysis notable for small leukoesterase, positive nitrite, few bacteria and 21-50 WBCs.  T-max on admission 100.8.  COVID-19/SARS-CoV-2 negative.  Chest x-ray with mild central vascular congestion.  Urine culture showing no growth, although obtained following initiation of IV antibiotics. --Now afebrile, no leukocytosis --continiue ceftriaxone, will complete antibiotic course today for 3 days --Supportive care, antiemetics, antipyretics  Transaminitis Early cirrhosis AST 63, ALT 27, total bilirubin 3.6, alkaline phosphatase 169 on admission.  CT abdomen/pelvis notable for morphologic findings in the liver with mild prominence of the lateral segment of the left hepatic lobe and caudate lobe concern for possible early cirrhosis.  Gallbladder normal in appearance.  EtOH level on admission less than 10.  Suspect possible underlying EtOH abuse, especially in the setting of her current home situation with divorce. --Continue to trend CMP, LFT's improving --CIWAA protocol --Outpatient follow-up  Acute renal failure on CKD stage III Creatinine 1.38 on admission.  Baseline 0.90 -1.25 for the past year.  Etiology likely underlying sepsis with urinary tract infection as above.  Received IV fluid hydration on admission. --Continue antibiotics as above --Avoid nephrotoxins, renally dose all medications --Repeat renal function in a.m.  Chronic pancytopenia Chronic anemia Follows with hematology, Dr. Marin Olp outpatient. Bone Marrow biopsy 2016 no evidence of myelodysplasia.  hemoglobin 8.1, MCV elevated at 101.9. --Platelet count 87-->58-->60 --Discontinue Lovenox in favor of SCDs --Follow CBC/platelet count daily --Outpatient follow-up  Subacute pelvic fractures Appear to be nonacute.  Subacute to chronic.  Patient has had  previous falls which may have explained these.    Currently pain-free with normal passive/active range of motion. --PT eval with recommendations  of home health and walker, DME orders placed, case management aware.    DVT prophylaxis: Lovenox Code Status: Full code Family Communication: None Disposition Plan: Inpatient,  plan discharge home 06/28/2018   Consultants:   None  Procedures:   None  Antimicrobials:   Vancomycin x 1 dose in ED 6/14  Cefepime x 1 dose in ED 6/14  Flagyl x 1 dose in ED 6/14  Ceftriaxone 6/14 - 6/16   Subjective: Patient seen and examined at bedside, resting comfortably in bed.  Continues with weakness and fatigue.  Feels slightly better than yesterday upon admission.  Does not feel ready for discharge home as she would like 1 more day and eventual discharge home to her parents house tomorrow.  Somewhat emotional as she is currently going through a divorce.  No other complaints at this time.  Denies visual changes, no fever/chill/night sweats, no chest pain, palpitations, no shortness of breath, no abdominal pain, no diarrhea, no cough/congestion, no paresthesias.  No acute events overnight per nursing staff.  Objective: Vitals:   06/26/18 0413 06/26/18 1257 06/26/18 2113 06/27/18 0438  BP: (!) 144/70 121/67 (!) 143/82 140/73  Pulse: 86 82 82 78  Resp: _0 Temp: 100.1 F (37.8 C) 99.4 F (37.4 C) 99 F (37.2 C) 99.2 F (37.3 C)  TempSrc: Oral Oral Oral Oral  SpO2: 100% 99% 96% 99%  Weight:      Height:        Intake/Output Summary (Last 24 hours) at 06/27/2018 1100 Last data filed at 06/27/2018 0600 Gross per 24 hour  Intake 2885.04 ml  Output 400 ml  Net 2485.04 ml   Filed Weights   06/25/18 1159 06/25/18 2020  Weight: 59 kg 61 kg    Examination:  General exam: Appears calm and comfortable, mildly anxious Respiratory system: Clear to auscultation. Respiratory effort normal.  Oxygenating well on room air Cardiovascular system: S1 & S2 heard, RRR. No JVD, murmurs, rubs, gallops or clicks. No pedal edema. Gastrointestinal system: Abdomen is nondistended, soft and  nontender. No organomegaly or masses felt. Normal bowel sounds heard. Central nervous system: Alert and oriented. No focal neurological deficits. Extremities: Symmetric 5 x 5 power. Skin: No rashes, lesions or ulcers Psychiatry: Judgement and insight appear normal.  Slightly anxious mood, normal affect    Data Reviewed: I have personally reviewed following labs and imaging studies  CBC: Recent Labs  Lab 06/25/18 1315 06/26/18 0516 06/27/18 0504  WBC 11.0* 6.5 4.8  NEUTROABS 9.5*  --   --   HGB 9.7* 8.1* 7.9*  HCT 30.4* 26.2* 25.8*  MCV 100.7* 101.9* 101.2*  PLT 87* 58* 60*   Basic Metabolic Panel: Recent Labs  Lab 06/25/18 1315 06/26/18 0516 06/27/18 0504  NA 140 137 141  K 3.5 3.2* 3.5  CL 116* 117* 118*  CO2 17* 16* 16*  GLUCOSE 106* 110* 110*  BUN _1 CREATININE 1.38* 1.29* 1.27*  CALCIUM 9.5 9.3 9.0  MG  --   --  2.3   GFR: Estimated Creatinine Clearance: 44.2 mL/min (A) (by C-G formula based on SCr of 1.27 mg/dL (H)). Liver Function Tests: Recent Labs  Lab 06/25/18 1315 06/26/18 0516 06/27/18 0504  AST 63* 47* 45*  ALT _2 ALKPHOS 169* 130* 146*  BILITOT 3.6* 2.8* 1.7*  PROT 6.7 5.3* 5.2*  ALBUMIN 3.1* 2.4* 2.4*   Recent Labs  Lab 06/25/18 1315  LIPASE 36   Recent Labs  Lab 06/25/18 1642  AMMONIA 42*   Coagulation Profile: No results for input(s): INR, PROTIME in the last 168 hours. Cardiac Enzymes: Recent Labs  Lab 06/25/18 1315  TROPONINI 0.03*   BNP (last 3 results) No results for input(s): PROBNP in the last 8760 hours. HbA1C: No results for input(s): HGBA1C in the last 72 hours. CBG: No results for input(s): GLUCAP in the last 168 hours. Lipid Profile: No results for input(s): CHOL, HDL, LDLCALC, TRIG, CHOLHDL, LDLDIRECT in the last 72 hours. Thyroid Function Tests: No results for input(s): TSH, T4TOTAL, FREET4, T3FREE, THYROIDAB in the last 72 hours. Anemia Panel: No results for input(s): VITAMINB12, FOLATE,  FERRITIN, TIBC, IRON, RETICCTPCT in the last 72 hours. Sepsis Labs: Recent Labs  Lab 06/25/18 1429 06/25/18 1642  LATICACIDVEN 1.5 1.2    Recent Results (from the past 240 hour(s))  Blood Culture (routine x 2)     Status: None (Preliminary result)   Collection Time: 06/25/18  1:15 PM   Specimen: BLOOD  Result Value Ref Range Status   Specimen Description   Final    BLOOD BLOOD RIGHT FOREARM Performed at [facility] [address] [place], [place] [address]    Special Requests   Final    BOTTLES DRAWN AEROBIC AND ANAEROBIC Blood Culture adequate volume Performed at [facility] [address] [place], [place] [address]    Culture   Final    NO GROWTH < 12 HOURS Performed at [facility] [address], [place], [place] [address]    Report Status PENDING  Incomplete  SARS Coronavirus 2 (CEPHEID - Performed in [place] hospital lab), Hosp Order     Status: None   Collection Time: 06/25/18  2:29 PM   Specimen: Nasopharyngeal Swab  Result Value Ref Range Status   SARS Coronavirus 2 NEGATIVE NEGATIVE Final    Comment: (NOTE) If result is NEGATIVE SARS-CoV-2 target nucleic acids are NOT DETECTED. The SARS-CoV-2 RNA is generally detectable in upper and lower  respiratory specimens during the acute phase of infection. The lowest  concentration of SARS-CoV-2 viral copies this assay can detect is 250  copies / mL. A negative result does not preclude SARS-CoV-2 infection  and should not be used as the sole basis for treatment or other  patient management decisions.  A negative result may occur with  improper specimen collection / handling, submission of specimen other  than nasopharyngeal swab, presence of viral mutation(s) within the  areas targeted by this assay, and inadequate number of viral copies  (<250 copies / mL). A negative result must be combined with clinical  observations, patient history, and epidemiological  information. If result is POSITIVE SARS-CoV-2 target nucleic acids are DETECTED. The SARS-CoV-2 RNA is generally detectable in upper and lower  respiratory specimens dur ing the acute phase of infection.  Positive  results are indicative of active infection with SARS-CoV-2.  Clinical  correlation with patient history and other diagnostic information is  necessary to determine patient infection status.  Positive results do  not rule out bacterial infection or co-infection with other viruses. If result is PRESUMPTIVE POSTIVE SARS-CoV-2 nucleic acids MAY BE PRESENT.   A presumptive positive result was obtained on the submitted specimen  and confirmed on repeat testing.  While 2019 novel coronavirus  (SARS-CoV-2) nucleic acids may be present in the submitted sample  additional confirmatory testing may be necessary for epidemiological  and / or clinical management purposes  to differentiate between  SARS-CoV-2 and other Sarbecovirus currently known to infect humans.  If clinically indicated additional testing with an alternate test  methodology [phone]) is advised. The SARS-CoV-2 RNA is generally  detectable in upper and lower respiratory sp ecimens during the acute  phase of infection. The expected result is Negative. Fact Sheet for Patients:  StrictlyIdeas.no Fact Sheet for Healthcare Providers: BankingDealers.co.za This test is not yet approved or cleared by the [place] FDA and has been authorized for detection and/or diagnosis of SARS-CoV-2 by FDA under an Emergency Use Authorization (EUA).  This EUA will remain in effect (meaning this test can be used) for the duration of the COVID-19 declaration under Section 564(b)(1) of the Act, 21 U.S.C. section 360bbb-3(b)(1), unless the authorization is terminated or revoked sooner. Performed at [facility], [place] [address] [place], [place] [address]   Urine culture      Status: None   Collection Time: 06/25/18  4:19 PM   Specimen: Urine, Random  Result Value Ref Range Status   Specimen Description   Final    URINE, RANDOM Performed at [facility] [address], [place], [place] [address]    Special Requests   Final    NONE Performed at [facility], [place] [address], [place], [place] [address]    Culture   Final    NO GROWTH Performed at [facility], [place] [address] [place],  [address]    Report Status 06/27/2018 FINAL  Final         Radiology Studies: Ct Abdomen Pelvis Wo Contrast  Result Date: 06/25/2018 CLINICAL DATA:  Abnormal liver function test. Diarrhea. Nausea and vomiting. EXAM: CT ABDOMEN AND PELVIS WITHOUT CONTRAST TECHNIQUE: Multidetector CT imaging of the abdomen and pelvis was performed following the standard protocol without IV contrast. COMPARISON:  Multiple exams, including MRI abdomen from 05/29/2013 and abdominal ultrasound from 07/03/2014 FINDINGS: Lower chest: Posterior basal segment lower lobe scarring or atelectasis bilaterally. Mild cardiomegaly. Right coronary artery atherosclerotic calcification. Hepatobiliary: Stable somewhat prominent caliber of the portal venous tributaries, portal vein, and splenic vein, not appreciably changed compared to the prior MRI from 05/29/2013. 1.2 cm by 0.9 cm fluid density lesion in segment 2 of the liver on image 17/2, this cyst previously measured 0.6 cm in diameter on 05/29/2013. Morphologic findings in liver such as mild prominence of the lateral segment left hepatic lobe and caudate lobe raise the possibility of early cirrhosis. Gallbladder unremarkable. Pancreas: Unremarkable Spleen: The spleen measures 14.5 by 6.6 by 11.7 cm (volume = 590 cm^3). Adrenals/Urinary Tract: The adrenal glands appear normal. There is a 5.5 by 4.6 cm right kidney upper pole cyst, this measured 5.0 by 4.0 cm on 05/29/2013. 3 mm left mid kidney nonobstructive renal  calculus on image 85/4. 3 mm left kidney lower pole nonobstructive calculus, image 84/4. Small hypodense lesions of the left mid kidney and left kidney lower pole are compatible with cysts and were shown on 05/29/2013. No hydronephrosis or hydroureter. Urinary bladder appears unremarkable. Stomach/Bowel: There is formed stool in the distal colon and rectum. No dilated bowel. Normal appendix. Vascular/Lymphatic: Aortoiliac atherosclerotic vascular disease. Small gastrohepatic ligament lymph nodes are present. No overtly pathologic adenopathy. Reproductive: Unremarkable Other: No supplemental non-categorized findings. Musculoskeletal: Subacute fractures of the left inferior pubic ramus and of the lateral portion of the left superior pubic ramus. These fractures  show some early healing response but have not fused. Such fractures are typically accompanied by a sacral fracture; there is some vertical sclerosis in the left sacral ala on image 93/4 which may represent a nondisplaced sacral fracture although this is a subtle finding. Lumbar spondylosis and degenerative disc disease noted with borderline left foraminal stenosis at L3-4. Grade 1 degenerative retrolisthesis at L2-3 and L3-4. IMPRESSION: 1. Subacute healing fractures of the left inferior and superior pubic ramus, and probably nondisplaced healing fracture of the left sacral ala. Correlate with history of trauma. 2. Formed stool in the colon, no dilated bowel or specific abnormal bowel wall thickening. 3. Mild cardiomegaly with right coronary artery atherosclerosis. 4. Probable early cirrhosis. Chronic prominence of the portal vein tributaries, portal vein, and splenic vein. Splenomegaly with splenic volume 590 cubic cm. 5. Nonobstructive left nephrolithiasis. 6. Benign hepatic and renal cysts. 7.  Aortic Atherosclerosis (ICD10-I70.0). Electronically Signed   By: Van Clines M.D.   On: 06/25/2018 18:15   Dg Chest 2 View  Result Date: 06/25/2018  CLINICAL DATA:  Weakness and nausea EXAM: CHEST - 2 VIEW COMPARISON:  06/09/2010 FINDINGS: Normal cardiac silhouette ectatic aorta. Central venous congestion increased from prior. No focal consolidation. No pleural fluid. No pneumothorax. No acute osseous abnormality. IMPRESSION: Increased central venous congestion. Electronically Signed   By: Suzy Bouchard M.D.   On: 06/25/2018 15:06   Ct Head Wo Contrast  Result Date: 06/25/2018 CLINICAL DATA:  Head trauma, weakness, fell 2 weeks ago, follow-up EXAM: CT HEAD WITHOUT CONTRAST TECHNIQUE: Contiguous axial images were obtained from the base of the skull through the vertex without intravenous contrast. Sagittal and coronal MPR images reconstructed from axial data set. COMPARISON:  02/02/2017 FINDINGS: Brain: Mild atrophy. Normal ventricular morphology. No midline shift or mass effect. Otherwise normal appearance of brain parenchyma. No intracranial hemorrhage, mass lesion or evidence of acute infarction. No extra-axial fluid collections. Vascular: No hyperdense vessels Skull: Demineralized but intact Sinuses/Orbits: Chronic opacification of RIGHT maxillary sinus. Remaining visualized paranasal sinuses and mastoid air cells clear. Other: N/A IMPRESSION: No acute intracranial abnormalities. Chronic RIGHT maxillary sinus disease. Electronically Signed   By: Lavonia Dana M.D.   On: 06/25/2018 15:46        Scheduled Meds: . cholecalciferol  1,000 Units Oral Daily  . folic acid  1 mg Oral Daily  . LORazepam  0-4 mg Oral Q6H   Followed by  . [START ON 06/28/2018] LORazepam  0-4 mg Oral Q12H  . multivitamin with minerals  1 tablet Oral Daily  . potassium chloride SA  40 mEq Oral BID  . thiamine  100 mg Oral Daily   Or  . thiamine  100 mg Intravenous Daily   Continuous Infusions: . cefTRIAXone (ROCEPHIN)  IV 1 g (06/26/18 2200)     LOS: 2 days    Time spent: 29 minutes    Eric J [place] DO [facility] Pager [phone]  If  7PM-7AM, please contact night-coverage [email or web address] Password [email or web address] 06/27/2018, 11:00 AM

## 2018-06-28 LAB — COMPREHENSIVE METABOLIC PANEL
ALT: 22 U/L (ref 0–44)
AST: 50 U/L — ABNORMAL HIGH (ref 15–41)
Albumin: 2.4 g/dL — ABNORMAL LOW (ref 3.5–5.0)
Alkaline Phosphatase: 152 U/L — ABNORMAL HIGH (ref 38–126)
Anion gap: 6 (ref 5–15)
BUN: 12 mg/dL (ref 8–23)
CO2: 16 mmol/L — ABNORMAL LOW (ref 22–32)
Calcium: 9.4 mg/dL (ref 8.9–10.3)
Chloride: 117 mmol/L — ABNORMAL HIGH (ref 98–111)
Creatinine, Ser: 1.14 mg/dL — ABNORMAL HIGH (ref 0.44–1.00)
GFR calc Af Amer: 60 mL/min — ABNORMAL LOW (ref >60)
GFR calc non Af Amer: 51 mL/min — ABNORMAL LOW (ref >60)
Glucose, Bld: 81 mg/dL (ref 70–99)
Potassium: 3.8 mmol/L (ref 3.5–5.1)
Sodium: 139 mmol/L (ref 135–145)
Total Bilirubin: 1.7 mg/dL — ABNORMAL HIGH (ref 0.3–1.2)
Total Protein: 5.2 g/dL — ABNORMAL LOW (ref 6.5–8.1)

## 2018-06-28 LAB — CBC WITH DIFFERENTIAL/PLATELET
Abs Immature Granulocytes: 0.01 10*3/uL (ref 0.00–0.07)
Basophils Absolute: 0 10*3/uL (ref 0.0–0.1)
Basophils Relative: 1 %
Eosinophils Absolute: 0.2 10*3/uL (ref 0.0–0.5)
Eosinophils Relative: 3 %
HCT: 26.4 % — ABNORMAL LOW (ref 36.0–46.0)
Hemoglobin: 8.1 g/dL — ABNORMAL LOW (ref 12.0–15.0)
Immature Granulocytes: 0 %
Lymphocytes Relative: 22 %
Lymphs Abs: 1.3 10*3/uL (ref 0.7–4.0)
MCH: 31 pg (ref 26.0–34.0)
MCHC: 30.7 g/dL (ref 30.0–36.0)
MCV: 101.1 fL — ABNORMAL HIGH (ref 80.0–100.0)
Monocytes Absolute: 0.7 10*3/uL (ref 0.1–1.0)
Monocytes Relative: 11 %
Neutro Abs: 3.7 10*3/uL (ref 1.7–7.7)
Neutrophils Relative %: 63 %
Platelets: 67 10*3/uL — ABNORMAL LOW (ref 150–400)
RBC: 2.61 MIL/uL — ABNORMAL LOW (ref 3.87–5.11)
RDW: 16.2 % — ABNORMAL HIGH (ref 11.5–15.5)
WBC: 5.7 10*3/uL (ref 4.0–10.5)
nRBC: 0 % (ref 0.0–0.2)

## 2018-06-28 LAB — PHOSPHORUS: Phosphorus: 1.8 mg/dL — ABNORMAL LOW (ref 2.5–4.6)

## 2018-06-28 LAB — MAGNESIUM: Magnesium: 2.2 mg/dL (ref 1.7–2.4)

## 2018-06-28 MED ORDER — THIAMINE HCL 100 MG PO TABS: 100.0000 mg | ORAL_TABLET | Freq: Every day | ORAL | 0 refills | Status: DC

## 2018-06-28 MED ORDER — K PHOS MONO-SOD PHOS DI & MONO 155-852-130 MG PO TABS: 500.0000 mg | ORAL_TABLET | Freq: Every day | ORAL | 0 refills | Status: DC

## 2018-06-28 MED ORDER — K PHOS MONO-SOD PHOS DI & MONO 155-852-130 MG PO TABS
500.0000 mg | ORAL_TABLET | Freq: Two times a day (BID) | ORAL | Status: DC
  Administered 2018-06-28: 500 mg via ORAL
  Filled 2018-06-28: qty 2

## 2018-06-28 MED ORDER — SODIUM BICARBONATE 650 MG PO TABS
650.0000 mg | ORAL_TABLET | Freq: Three times a day (TID) | ORAL | Status: DC
  Administered 2018-06-28: 650 mg via ORAL
  Filled 2018-06-28: qty 1

## 2018-06-28 MED ORDER — ADULT MULTIVITAMIN W/MINERALS CH: 1.0000 | ORAL_TABLET | Freq: Every day | ORAL | 0 refills | Status: AC

## 2018-06-28 MED ORDER — SODIUM BICARBONATE 650 MG PO TABS: 650.0000 mg | ORAL_TABLET | Freq: Two times a day (BID) | ORAL | 0 refills | Status: DC

## 2018-06-28 MED ORDER — SACCHAROMYCES BOULARDII 250 MG PO CAPS: 250.0000 mg | ORAL_CAPSULE | Freq: Two times a day (BID) | ORAL | 0 refills | Status: DC

## 2018-06-28 MED ORDER — FOLIC ACID 1 MG PO TABS: 1.0000 mg | ORAL_TABLET | Freq: Every day | ORAL | 0 refills | Status: DC

## 2018-06-28 NOTE — Discharge Summary (Signed)
Physician Discharge Summary  Dana Watkins ATF:[id number] DOB: [birth year] DOA: 06/25/2018  PCP: Dana Cruel, MD  Admit date: 06/25/2018 Discharge date: 06/28/2018  Admitted From: Home Disposition: Home with [facility]  Recommendations for Outpatient Follow-up:  1. Follow up with PCP in 1-2 weeks 2. Follow up with Medical Oncology Dr. Marin Watkins within 1 week 3. Follow up with Nephrology as an outpatient  4. Continue to Monitor Diarrhea and if worsens may need repeat testing for C Difficile but clinically currently do not suspect it 5. Please obtain CMP/CBC, Mag, Phos in one week 6. Please follow up on the following pending results:  Home Health: Yes Equipment/Devices: DME Rolling Walker  Discharge Condition: Stable  CODE STATUS: FULL CODE  Diet recommendation: Heart Healthy Diet   Brief/Interim Summary: HPI per Dr. Gala Watkins on 06/25/2018  Dana Watkins a 63 y.o.femalewith medical history significant ofHypertension, C. difficile colitis,coronary artery disease, pancytopenia who came to the ER complaining of generalized weakness and nausea. She was apparently recently treated for pneumonia by her PCP about a week ago. She has continued to have significant nausea with her last dose of antibiotics supposed to be today. No active diarrhea but she has previously been treated for C. difficile. She is getting intermittent nausea vomiting with diarrhea but today is been more persistent. Denied any shortness of breath or cough but had a fever up to 101 at home. She denied any new sick contacts. Patient was seen and evaluated and found to have evidence of UTI and parameters for SIRS. She is therefore being admitted with early sepsis due to UTI..  Temperature is 100.8 with blood pressure 144/78, pulse 91 respiratory 25 and oxygen sat 97% room air.White count is 11,000 hemoglobin 9.7 and platelets 87. Sodium 140 potassium 3.5 chloride 116 CO2 of 17 with creatinine  1.38 and glucose 106. Lactic acid is 1.2. Troponin 0 0.03. Urinalysis showed positive nitrite WBC 21-50 with bacteria. Urine drug screen is positive for benzodiazepines. Head CT without contrast is negative CT abdomen pelvis showed no active disease. Patient is therefore being admitted with UTI leading to early sepsis.  **Interim History  Patient was treated for 3 days of antibiotics for her urinary tract infection and she subsequently improved.  PT recommending home health and not rolling walker.  Electrolytes were repleted prior to discharge and patient's blood count remained stable.  She did have a slight metabolic acidosis and she was given sodium bicarbonate and told to follow-up with nephrology in the outpatient setting.  Prior to discharge patient minute having some mild diarrhea and states that she had 5 episodes in the last 24 hours.  Clinically do not suspect C. difficile as she is currently afebrile and has no leukocytosis but told her to follow-up with PCP closely.  She is deemed stable for discharge she will need to follow-up with PCP, nephrology, medical oncology in outpatient setting.  She improved significantly and her weakness is improved and she no longer is having urinary tract symptoms.  Discharge Diagnoses:  Active Problems:   Elevated liver function tests   Chronic leukopenia   Benign essential hypertension   GAD (generalized anxiety disorder)   UTI (urinary tract infection)  Sepsis from UTI, poA Acute cystitis without hematuria -Patient presenting from home with progressive nausea/vomiting, weakness and fever.   -Recently treated by PCP for pneumonia with azithromycin.   -History of C. difficile colitis January 2019.   -Urinalysis notable for small leukoesterase, positive nitrite, few bacteria and 21-50  WBCs.   -LA went from 1.5 -> 1.2 -T-max on admission 100.8.  -COVID-19/SARS-CoV-2 negative.   -Chest x-ray with mild central vascular congestion.   -Urine culture  showing no growth, although obtained following initiation of IV antibiotics. -Now afebrile, no leukocytosis and WBC is 5.7 (Down from 11.0) -Continiued Ceftriaxone and completed Antibiotic course today for 3 days -C/w Supportive care, antiemetics, antipyretics  Abnormal LFTs/Transaminitis Early Cirrhosis Suspected Alcoholism  -AST 63, ALT 27, total bilirubin 3.6, alkaline phosphatase 169 on admission.   -CT abdomen/pelvis notable for morphologic findings in the liver with mild prominence of the lateral segment of the left hepatic lobe and caudate lobe concern for possible early cirrhosis.   -Gallbladder normal in appearance.  EtOH level on admission less than 10.   -Acute Hepatitis Panel Negative  -Suspect possible underlying EtOH abuse, especially in the setting of her current home situation with divorce. -Continued to trend CMP, LFT's improving and stable as AST is 50 and ALT is 22; T Bili is now 1.7 -Continued CIWA Protocol while hospitalized along with folic acid 1 mg p.o. daily, multivitamin with minerals 1 tab p.o. daily, as well as thiamine 100 mg p.o. daily -Outpatient follow-up with PCP   Acute renal failure on CKD stage III Hyperchloremic Metabolic Acidosis  -Creatinine 1.38 on admission.  Baseline 0.90 -1.25 for the past year.  -BUN/Creatinine has improved and is now 12/1.14 -Etiology likely underlying sepsis with urinary tract infection as above.  Received IV fluid hydration on admission. -Continued Antibiotics as above -Chloride was 117 and CO2 was 16 with AG of 6; Given 3 doses of Sodium Bicarbonate -Avoid nephrotoxins, renally dose all medications -Repeat Renal function within 1 week .  Chronic Pancytopenia Chronic Macrocytic Anemia, suspect from Alcoholism  -Follows with Hematology, Dr. Marin Watkins outpatient.  -Bone Marrow biopsy 2016 no evidence of myelodysplasia.   -Hemoglobin 8.1 today, MCV elevated at 101.1. -Platelet count 87 -> 58 -> 60 -> 67 -Discontinue Lovenox  in favor of SCDs  -Follow CBC/platelet count Carefully; Continue for signs and symptoms of bleeding; currently no overt bleeding noted -Will need outpatient follow up with PCP  Hypophosphatemia -Patient's phosphorus level was 1.8 -Replete with K-Phos neutral 500 mg p.o. twice daily x2 doses -Continue monitor replete as necessary -Repeat phosphorus level within 1 week  Subacute Pelvic Fractures -Appear to be nonacute. Subacute to chronic.  -Patient has had previous falls which may have explained these. -Currently pain-free with normal passive/active range of motion. -PT eval with recommendations of home health and walker, DME orders placed, case management aware.  Diarrhea -Mild  -Has a Hx of C Diff -Suspect Antibiotic Induced -Clinically currently do not suspect C Diff as she is afebrile and has no Leukocytosis -Will write for a Probiotic -Continue to Monitor and discussed with her if gets worse will need to be seen by PCP earlier than scheduled.   Discharge Instructions  Discharge Instructions    Call MD for:  difficulty breathing, headache or visual disturbances   Complete by: As directed    Call MD for:  extreme fatigue   Complete by: As directed    Call MD for:  persistant dizziness or light-headedness   Complete by: As directed    Call MD for:  persistant nausea and vomiting   Complete by: As directed    Call MD for:  severe uncontrolled pain   Complete by: As directed    Call MD for:  temperature >100.4   Complete by: As directed  Diet - low sodium heart healthy   Complete by: As directed    Increase activity slowly   Complete by: As directed      Allergies as of 06/28/2018      Reactions   Sulfa Antibiotics Nausea And Vomiting, Rash      Medication List    STOP taking these medications   azithromycin 250 MG tablet Commonly known as: ZITHROMAX   metroNIDAZOLE 500 MG tablet Commonly known as: FLAGYL     TAKE these medications   Klor-Con M20 20  MEQ tablet Generic drug: potassium chloride SA Take 40 mEq by mouth 2 (two) times a day.   MAGNESIUM PO Take 1 tablet by mouth daily.   naproxen sodium 220 MG tablet Commonly known as: ALEVE Take 220 mg by mouth 2 (two) times daily as needed (pain/headache).   polyvinyl alcohol 1.4 % ophthalmic solution Commonly known as: LIQUIFILM TEARS Place 1 drop into both eyes as needed for dry eyes.   Vitamin D3 50 MCG (2000 UT) Tabs Take 1 tablet by mouth daily.            Durable Medical Equipment  (From admission, onward)         Start     Ordered   06/26/18 1323  For home use only DME Walker rolling  Once    Comments: With 5in wheels  Question:  Patient needs a walker to treat with the following condition  Answer:  Gait abnormality   06/26/18 1325         Follow-up Information    Dana Cruel, MD. Call in 1 week(s).   Specialty: Family Medicine Contact information: [place] [address] [phone]          Allergies  Allergen Reactions  . Sulfa Antibiotics Nausea And Vomiting and Rash   Consultations:  None  Procedures/Studies: Ct Abdomen Pelvis Wo Contrast  Result Date: 06/25/2018 CLINICAL DATA:  Abnormal liver function test. Diarrhea. Nausea and vomiting. EXAM: CT ABDOMEN AND PELVIS WITHOUT CONTRAST TECHNIQUE: Multidetector CT imaging of the abdomen and pelvis was performed following the standard protocol without IV contrast. COMPARISON:  Multiple exams, including MRI abdomen from 05/29/2013 and abdominal ultrasound from 07/03/2014 FINDINGS: Lower chest: Posterior basal segment lower lobe scarring or atelectasis bilaterally. Mild cardiomegaly. Right coronary artery atherosclerotic calcification. Hepatobiliary: Stable somewhat prominent caliber of the portal venous tributaries, portal vein, and splenic vein, not appreciably changed compared to the prior MRI from 05/29/2013. 1.2 cm by 0.9 cm fluid density lesion in segment 2 of the  liver on image 17/2, this cyst previously measured 0.6 cm in diameter on 05/29/2013. Morphologic findings in liver such as mild prominence of the lateral segment left hepatic lobe and caudate lobe raise the possibility of early cirrhosis. Gallbladder unremarkable. Pancreas: Unremarkable Spleen: The spleen measures 14.5 by 6.6 by 11.7 cm (volume = 590 cm^3). Adrenals/Urinary Tract: The adrenal glands appear normal. There is a 5.5 by 4.6 cm right kidney upper pole cyst, this measured 5.0 by 4.0 cm on 05/29/2013. 3 mm left mid kidney nonobstructive renal calculus on image 85/4. 3 mm left kidney lower pole nonobstructive calculus, image 84/4. Small hypodense lesions of the left mid kidney and left kidney lower pole are compatible with cysts and were shown on 05/29/2013. No hydronephrosis or hydroureter. Urinary bladder appears unremarkable. Stomach/Bowel: There is formed stool in the distal colon and rectum. No dilated bowel. Normal appendix. Vascular/Lymphatic: Aortoiliac atherosclerotic vascular disease. Small gastrohepatic ligament lymph nodes  are present. No overtly pathologic adenopathy. Reproductive: Unremarkable Other: No supplemental non-categorized findings. Musculoskeletal: Subacute fractures of the left inferior pubic ramus and of the lateral portion of the left superior pubic ramus. These fractures show some early healing response but have not fused. Such fractures are typically accompanied by a sacral fracture; there is some vertical sclerosis in the left sacral ala on image 93/4 which may represent a nondisplaced sacral fracture although this is a subtle finding. Lumbar spondylosis and degenerative disc disease noted with borderline left foraminal stenosis at L3-4. Grade 1 degenerative retrolisthesis at L2-3 and L3-4. IMPRESSION: 1. Subacute healing fractures of the left inferior and superior pubic ramus, and probably nondisplaced healing fracture of the left sacral ala. Correlate with history of trauma.  2. Formed stool in the colon, no dilated bowel or specific abnormal bowel wall thickening. 3. Mild cardiomegaly with right coronary artery atherosclerosis. 4. Probable early cirrhosis. Chronic prominence of the portal vein tributaries, portal vein, and splenic vein. Splenomegaly with splenic volume 590 cubic cm. 5. Nonobstructive left nephrolithiasis. 6. Benign hepatic and renal cysts. 7.  Aortic Atherosclerosis (ICD10-I70.0). Electronically Signed   By: Van Clines M.D.   On: 06/25/2018 18:15   Dg Chest 2 View  Result Date: 06/25/2018 CLINICAL DATA:  Weakness and nausea EXAM: CHEST - 2 VIEW COMPARISON:  06/09/2010 FINDINGS: Normal cardiac silhouette ectatic aorta. Central venous congestion increased from prior. No focal consolidation. No pleural fluid. No pneumothorax. No acute osseous abnormality. IMPRESSION: Increased central venous congestion. Electronically Signed   By: Suzy Bouchard M.D.   On: 06/25/2018 15:06   Dg Chest 2 View  Result Date: 06/09/2018 CLINICAL DATA:  Nonproductive cough. EXAM: CHEST - 2 VIEW COMPARISON:  Chest x-ray 02/02/2017. FINDINGS: Mediastinum and hilar structures normal. Mild right mid lung field infiltrate cannot be excluded. No pleural effusion or pneumothorax. Stable cardiomegaly. Diffuse thoracic spine osteopenia degenerative change. IMPRESSION: 1. Mild right mid lung field infiltrate cannot be excluded. 2.  Stable cardiomegaly. No pulmonary venous congestion. Electronically Signed   By: Marcello Moores  Register   On: 06/09/2018 13:08   Ct Head Wo Contrast  Result Date: 06/25/2018 CLINICAL DATA:  Head trauma, weakness, fell 2 weeks ago, follow-up EXAM: CT HEAD WITHOUT CONTRAST TECHNIQUE: Contiguous axial images were obtained from the base of the skull through the vertex without intravenous contrast. Sagittal and coronal MPR images reconstructed from axial data set. COMPARISON:  02/02/2017 FINDINGS: Brain: Mild atrophy. Normal ventricular morphology. No midline shift  or mass effect. Otherwise normal appearance of brain parenchyma. No intracranial hemorrhage, mass lesion or evidence of acute infarction. No extra-axial fluid collections. Vascular: No hyperdense vessels Skull: Demineralized but intact Sinuses/Orbits: Chronic opacification of RIGHT maxillary sinus. Remaining visualized paranasal sinuses and mastoid air cells clear. Other: N/A IMPRESSION: No acute intracranial abnormalities. Chronic RIGHT maxillary sinus disease. Electronically Signed   By: Lavonia Dana M.D.   On: 06/25/2018 15:46     Subjective: Seen and examined at bedside states that she is doing much better today than she was yesterday.  No nausea or vomiting.  Denies chest pain, and as of dizziness.  No burning or discomfort in urine.  States that she did have some diarrhea from yesterday into today and she is concerned that her C. difficile has reoccured but she has no leukocytosis, white count, or abdominal pain so she will continue to monitor closely and if necessary she will call her PCP.  She was deemed stable for discharge will need to follow-up within 1  week and she is understandable and agreeable with the plan of care.  Discharge Exam: Vitals:   06/27/18 2130 06/28/18 0547  BP: (!) 152/82 136/76  Pulse: 87 85  Resp: 20 20  Temp: 98.7 F (37.1 C) 98.6 F (37 C)  SpO2: 97% 95%   Vitals:   06/26/18 2113 06/27/18 0438 06/27/18 2130 06/28/18 0547  BP: (!) 143/82 140/73 (!) 152/82 136/76  Pulse: 82 78 87 85  Resp: _0 Temp: 99 F (37.2 C) 99.2 F (37.3 C) 98.7 F (37.1 C) 98.6 F (37 C)  TempSrc: Oral Oral Oral Oral  SpO2: 96% 99% 97% 95%  Weight:      Height:       General: Pt is alert, awake, not in acute distress Cardiovascular: RRR, S1/S2 +, no rubs, no gallops Respiratory: Diminished bilaterally, no wheezing, no rhonchi Abdominal: Soft, NT, ND, bowel sounds + Extremities: no edema, no cyanosis  The results of significant diagnostics from this hospitalization  (including imaging, microbiology, ancillary and laboratory) are listed below for reference.    Microbiology: Recent Results (from the past 240 hour(s))  Blood Culture (routine x 2)     Status: None (Preliminary result)   Collection Time: 06/25/18  1:15 PM   Specimen: BLOOD  Result Value Ref Range Status   Specimen Description   Final    BLOOD BLOOD RIGHT FOREARM Performed at [facility] [address] [place], [place] [address]    Special Requests   Final    BOTTLES DRAWN AEROBIC AND ANAEROBIC Blood Culture adequate volume Performed at [facility] [address] [place], [place] [address]    Culture   Final    NO GROWTH 2 DAYS Performed at [facility] [address], [place], [place] [address]    Report Status PENDING  Incomplete  Blood Culture (routine x 2)     Status: None (Preliminary result)   Collection Time: 06/25/18  2:29 PM   Specimen: BLOOD  Result Value Ref Range Status   Specimen Description   Final    BLOOD LEFT ANTECUBITAL Performed at [facility] [address], [place], [place] [address]    Special Requests   Final    BOTTLES DRAWN AEROBIC ONLY Blood Culture adequate volume Performed at [facility] [address], [place], [place] [address]    Culture   Final    NO GROWTH 2 DAYS Performed at [facility] [address] [place], [place] [address]    Report Status PENDING  Incomplete  SARS Coronavirus 2 (CEPHEID - Performed in [facility] hospital lab), Hosp Order     Status: None   Collection Time: 06/25/18  2:29 PM   Specimen: Nasopharyngeal Swab  Result Value Ref Range Status   SARS Coronavirus 2 NEGATIVE NEGATIVE Final    Comment: (NOTE) If result is NEGATIVE SARS-CoV-2 target nucleic acids are NOT DETECTED. The SARS-CoV-2 RNA is generally detectable in upper and lower  respiratory specimens during the acute phase of infection. The lowest   concentration of SARS-CoV-2 viral copies this assay can detect is 250  copies / mL. A negative result does not preclude SARS-CoV-2 infection  and should not be used as the sole basis for treatment or other  patient management decisions.  A negative result may occur with  improper specimen collection / handling, submission of specimen other  than nasopharyngeal swab, presence of viral mutation(s) within the  areas targeted by this assay, and inadequate number of viral copies  (<250 copies / mL). A negative result must be combined with clinical  observations, patient history, and epidemiological information. If result is POSITIVE SARS-CoV-2 target nucleic acids are DETECTED. The SARS-CoV-2 RNA is generally detectable in upper and lower  respiratory specimens dur ing the acute phase of infection.  Positive  results are indicative of active infection with SARS-CoV-2.  Clinical  correlation with patient history and other diagnostic information is  necessary to determine patient infection status.  Positive results do  not rule out bacterial infection or co-infection with other viruses. If result is PRESUMPTIVE POSTIVE SARS-CoV-2 nucleic acids MAY BE PRESENT.   A presumptive positive result was obtained on the submitted specimen  and confirmed on repeat testing.  While 2019 novel coronavirus  (SARS-CoV-2) nucleic acids may be present in the submitted sample  additional confirmatory testing may be necessary for epidemiological  and / or clinical management purposes  to differentiate between  SARS-CoV-2 and other Sarbecovirus currently known to infect humans.  If clinically indicated additional testing with an alternate test  methodology [phone]) is advised. The SARS-CoV-2 RNA is generally  detectable in upper and lower respiratory sp ecimens during the acute  phase of infection. The expected result is Negative. Fact Sheet for Patients:  StrictlyIdeas.no Fact Sheet  for Healthcare Providers: BankingDealers.co.za This test is not yet approved or cleared by the [place] FDA and has been authorized for detection and/or diagnosis of SARS-CoV-2 by FDA under an Emergency Use Authorization (EUA).  This EUA will remain in effect (meaning this test can be used) for the duration of the COVID-19 declaration under Section 564(b)(1) of the Act, 21 U.S.C. section 360bbb-3(b)(1), unless the authorization is terminated or revoked sooner. Performed at [facility], [place] [address], [place], [place] [address]   Urine culture     Status: None   Collection Time: 06/25/18  4:19 PM   Specimen: Urine, Random  Result Value Ref Range Status   Specimen Description   Final    URINE, RANDOM Performed at [facility] [address], [place], [place] [address]    Special Requests   Final    NONE Performed at [facility], [place] [address] [place], [place] [address]    Culture   Final    NO GROWTH Performed at [facility], [place] [address] [place], [place] [address]    Report Status 06/27/2018 FINAL  Final    Labs: BNP (last 3 results) No results for input(s): BNP in the last 8760 hours. Basic Metabolic Panel: Recent Labs  Lab 06/25/18 1315 06/26/18 0516 06/27/18 0504 06/28/18 0805  NA 140 137 141 139  K 3.5 3.2* 3.5 3.8  CL 116* 117* 118* 117*  CO2 17* 16* 16* 16*  GLUCOSE 106* 110* 110* 81  BUN _0 CREATININE 1.38* 1.29* 1.27* 1.14*  CALCIUM 9.5 9.3 9.0 9.4  MG  --   --  2.3 2.2  PHOS  --   --   --  1.8*   Liver Function Tests: Recent Labs  Lab 06/25/18 1315 06/26/18 0516 06/27/18 0504 06/28/18 0805  AST 63* 47* 45* 50*  ALT _1 ALKPHOS 169* 130* 146* 152*  BILITOT 3.6* 2.8* 1.7* 1.7*  PROT 6.7 5.3* 5.2* 5.2*  ALBUMIN 3.1* 2.4* 2.4* 2.4*   Recent Labs  Lab 06/25/18 1315  LIPASE 36   Recent  Labs  Lab 06/25/18 1642  AMMONIA 42*    CBC: Recent Labs  Lab 06/25/18 1315 06/26/18 0516 06/27/18 0504 06/28/18 0805  WBC 11.0* 6.5 4.8 5.7  NEUTROABS 9.5*  --   --  3.7  HGB 9.7* 8.1* 7.9* 8.1*  HCT 30.4* 26.2* 25.8* 26.4*  MCV 100.7* 101.9* 101.2* 101.1*  PLT 87* 58* 60* 67*   Cardiac Enzymes: Recent Labs  Lab 06/25/18 1315  TROPONINI 0.03*   BNP: Invalid input(s): POCBNP CBG: No results for input(s): GLUCAP in the last 168 hours. D-Dimer No results for input(s): DDIMER in the last 72 hours. Hgb A1c No results for input(s): HGBA1C in the last 72 hours. Lipid Profile No results for input(s): CHOL, HDL, LDLCALC, TRIG, CHOLHDL, LDLDIRECT in the last 72 hours. Thyroid function studies No results for input(s): TSH, T4TOTAL, T3FREE, THYROIDAB in the last 72 hours.  Invalid input(s): FREET3 Anemia work up No results for input(s): VITAMINB12, FOLATE, FERRITIN, TIBC, IRON, RETICCTPCT in the last 72 hours. Urinalysis    Component Value Date/Time   COLORURINE YELLOW 06/25/2018 1619   APPEARANCEUR HAZY (A) 06/25/2018 1619   LABSPEC 1.015 06/25/2018 1619   PHURINE 6.0 06/25/2018 1619   GLUCOSEU 50 (A) 06/25/2018 1619   HGBUR NEGATIVE 06/25/2018 1619   BILIRUBINUR NEGATIVE 06/25/2018 1619   KETONESUR NEGATIVE 06/25/2018 1619   PROTEINUR 30 (A) 06/25/2018 1619   NITRITE POSITIVE (A) 06/25/2018 1619   LEUKOCYTESUR SMALL (A) 06/25/2018 1619   Sepsis Labs Invalid input(s): PROCALCITONIN,  WBC,  LACTICIDVEN Microbiology Recent Results (from the past 240 hour(s))  Blood Culture (routine x 2)     Status: None (Preliminary result)   Collection Time: 06/25/18  1:15 PM   Specimen: BLOOD  Result Value Ref Range Status   Specimen Description   Final    BLOOD BLOOD RIGHT FOREARM Performed at [facility], [place] [address] [place], [place] [address]    Special Requests   Final    BOTTLES DRAWN AEROBIC AND ANAEROBIC Blood Culture adequate volume Performed at [facility] [address] [place], [place] [address]    Culture   Final    NO GROWTH 2 DAYS Performed at [facility] [address] [place], [place] [address]    Report Status PENDING  Incomplete  Blood Culture (routine x 2)     Status: None (Preliminary result)   Collection Time: 06/25/18  2:29 PM   Specimen: BLOOD  Result Value Ref Range Status   Specimen Description   Final    BLOOD LEFT ANTECUBITAL Performed at [facility] [address] [place], [place] [address]    Special Requests   Final    BOTTLES DRAWN AEROBIC ONLY Blood Culture adequate volume Performed at [facility] [address], [place], [place] [address]    Culture   Final    NO GROWTH 2 DAYS Performed at [facility] [address] [place], [place] [address]    Report Status PENDING  Incomplete  SARS Coronavirus 2 (CEPHEID - Performed in [facility] hospital lab), Hosp Order     Status: None   Collection Time: 06/25/18  2:29 PM   Specimen: Nasopharyngeal Swab  Result Value Ref Range Status   SARS Coronavirus 2 NEGATIVE NEGATIVE Final    Comment: (NOTE) If result is NEGATIVE SARS-CoV-2 target nucleic acids are NOT DETECTED. The SARS-CoV-2 RNA is generally detectable in upper and lower  respiratory specimens during the  acute phase of infection. The lowest  concentration of SARS-CoV-2 viral copies this assay can detect is 250  copies / mL. A negative result does not preclude SARS-CoV-2 infection  and should not be used as the sole basis for treatment or other  patient management decisions.  A negative result may occur with  improper specimen collection / handling, submission of specimen other  than nasopharyngeal swab, presence of viral mutation(s) within the  areas targeted by this assay, and inadequate number of viral copies  (<250 copies / mL). A negative result must be combined with clinical  observations, patient history, and epidemiological information. If  result is POSITIVE SARS-CoV-2 target nucleic acids are DETECTED. The SARS-CoV-2 RNA is generally detectable in upper and lower  respiratory specimens dur ing the acute phase of infection.  Positive  results are indicative of active infection with SARS-CoV-2.  Clinical  correlation with patient history and other diagnostic information is  necessary to determine patient infection status.  Positive results do  not rule out bacterial infection or co-infection with other viruses. If result is PRESUMPTIVE POSTIVE SARS-CoV-2 nucleic acids MAY BE PRESENT.   A presumptive positive result was obtained on the submitted specimen  and confirmed on repeat testing.  While 2019 novel coronavirus  (SARS-CoV-2) nucleic acids may be present in the submitted sample  additional confirmatory testing may be necessary for epidemiological  and / or clinical management purposes  to differentiate between  SARS-CoV-2 and other Sarbecovirus currently known to infect humans.  If clinically indicated additional testing with an alternate test  methodology [phone]) is advised. The SARS-CoV-2 RNA is generally  detectable in upper and lower respiratory sp ecimens during the acute  phase of infection. The expected result is Negative. Fact Sheet for Patients:  StrictlyIdeas.no Fact Sheet for Healthcare Providers: BankingDealers.co.za This test is not yet approved or cleared by the [place] FDA and has been authorized for detection and/or diagnosis of SARS-CoV-2 by FDA under an Emergency Use Authorization (EUA).  This EUA will remain in effect (meaning this test can be used) for the duration of the COVID-19 declaration under Section 564(b)(1) of the Act, 21 U.S.C. section 360bbb-3(b)(1), unless the authorization is terminated or revoked sooner. Performed at [facility], [place] [address] [place], [place] [address]   Urine culture     Status: None    Collection Time: 06/25/18  4:19 PM   Specimen: Urine, Random  Result Value Ref Range Status   Specimen Description   Final    URINE, RANDOM Performed at [place] [address], [name], [place] [address]    Special Requests   Final    NONE Performed at [facility], [place] [address], [place], [place] [address]    Culture   Final    NO GROWTH Performed at [facility], [place] [address], [place], [place] [address]    Report Status 06/27/2018 FINAL  Final   Time coordinating discharge: 35 minutes  SIGNED:  Kerney Elbe, DO [facility] 06/28/2018, 10:32 AM Pager is on [place]  If 7PM-7AM, please contact night-coverage [email or web address] Password [email or web address]

## 2018-06-28 NOTE — Progress Notes (Signed)
Physical Therapy Treatment Patient Details Name: Dana Watkins MRN: [id number] DOB: 12/22/1955 Today's Date: 06/28/2018    History of Present Illness 63 yo female admitted to ED on 6/14 with PNa, weakness, UTI sepsis. CT reveals early liver cirrhosis (alcohol abuse suspected), subacute-chronic pubic fractures secondary to pt reports of falls. PMH includes HTN, cdiff, CAD with coronary angiography, pancytopenia, CKDIII, migraines.    PT Comments    Pt is possibly d/c home today. Pt reports her sister will be working from home and available to provide supervision as needed. Pt continues to demonstrate some decreased balance and decreased safety awareness and cognition. Pt was highly distracted, decreased problem and required min assist to complete a simple path finding task. Reinforced to pt she is a fall risk and recommend supervision with gait. Pt agreeable.   Follow Up Recommendations  Home health PT;Supervision/Assistance - 24 hour     Equipment Recommendations  None recommended by PT(pt reports she has a RW)    Recommendations for Other Services       Precautions / Restrictions Precautions Precautions: Fall Precaution Comments: unsteady, ataxic Restrictions Weight Bearing Restrictions: No    Mobility  Bed Mobility Overal bed mobility: Independent                Transfers   Equipment used: None Transfers: Sit to/from Stand Sit to Stand: Modified independent (Device/Increase time)            Ambulation/Gait Ambulation/Gait assistance: Supervision Gait Distance (Feet): 500 Feet   Gait Pattern/deviations: Step-through pattern;Scissoring;Ataxic;Staggering left;Staggering right Gait velocity: WNL   General Gait Details: Pt highly distracted, ataxic gait with LE sissoring at times, no LOB. Pt completed a task finding task with min verbal cues. Pt requires supervison secondary to cognition and balance.   Stairs             Wheelchair Mobility     Modified Rankin (Stroke Patients Only)       Balance Overall balance assessment: Mild deficits observed, not formally tested   Sitting balance-Leahy Scale: Normal       Standing balance-Leahy Scale: Fair               High level balance activites: Direction changes;Turns;Sudden stops              Cognition Arousal/Alertness: Awake/alert Behavior During Therapy: Impulsive Overall Cognitive Status: No family/caregiver present to determine baseline cognitive functioning Area of Impairment: Safety/judgement;Problem solving;Awareness                         Safety/Judgement: Decreased awareness of safety Awareness: Intellectual Problem Solving: Slow processing;Difficulty sequencing        Exercises      General Comments        Pertinent Vitals/Pain Pain Assessment: No/denies pain    Home Living                      Prior Function            PT Goals (current goals can now be found in the care plan section) Progress towards PT goals: Progressing toward goals    Frequency    Min 3X/week      PT Plan Current plan remains appropriate    Co-evaluation              AM-PAC PT "6 Clicks" Mobility   Outcome Measure  Help needed turning from your back to your side while in  a flat bed without using bedrails?: None Help needed moving from lying on your back to sitting on the side of a flat bed without using bedrails?: None Help needed moving to and from a bed to a chair (including a wheelchair)?: A Little Help needed standing up from a chair using your arms (e.g., wheelchair or bedside chair)?: A Little Help needed to walk in hospital room?: A Little Help needed climbing 3-5 steps with a railing? : A Little 6 Click Score: 20    End of Session Equipment Utilized During Treatment: Gait belt Activity Tolerance: Patient tolerated treatment well Patient left: in bed;with call bell/phone within reach;with bed alarm set Nurse  Communication: Mobility status PT Visit Diagnosis: Unsteadiness on feet (R26.81);History of falling (Z91.81);Difficulty in walking, not elsewhere classified (R26.2)     Time: 0721-8288 PT Time Calculation (min) (ACUTE ONLY): 21 min  Charges:  $Gait Training: 8-22 mins                    Theodoro Grist, PT   Lelon Mast 06/28/2018, 9:21 AM

## 2018-06-28 NOTE — Progress Notes (Signed)
Patient discharging home.  IV removed - WNL.  Reviewed AVS and medications.  Instructed to only take what is listed on DC instructions.  Patient verbalizes understanding.  No questions at this time and patient in NAD - family on the way to pick up at main entrance

## 2018-06-30 LAB — CULTURE, BLOOD (ROUTINE X 2)
Culture: NO GROWTH
Culture: NO GROWTH
Special Requests: ADEQUATE
Special Requests: ADEQUATE

## 2018-09-01 ENCOUNTER — Other Ambulatory Visit: Payer: Self-pay | Admitting: Psychiatry

## 2018-10-16 ENCOUNTER — Inpatient Hospital Stay (HOSPITAL_COMMUNITY)
Admission: EM | Admit: 2018-10-16 | Discharge: 2018-10-24 | DRG: 872 | Disposition: A | Discharge status: Home / Self Care | Payer: Medicare Other | Attending: Internal Medicine | Admitting: Internal Medicine

## 2018-10-16 ENCOUNTER — Emergency Department (HOSPITAL_COMMUNITY): Payer: Medicare Other

## 2018-10-16 ENCOUNTER — Other Ambulatory Visit: Payer: Self-pay

## 2018-10-16 ENCOUNTER — Encounter (HOSPITAL_COMMUNITY): Payer: Self-pay

## 2018-10-16 DIAGNOSIS — R161 Splenomegaly, not elsewhere classified: Secondary | ICD-10-CM | POA: Diagnosis present

## 2018-10-16 DIAGNOSIS — R778 Other specified abnormalities of plasma proteins: Secondary | ICD-10-CM | POA: Diagnosis not present

## 2018-10-16 DIAGNOSIS — R6510 Systemic inflammatory response syndrome (SIRS) of non-infectious origin without acute organ dysfunction: Secondary | ICD-10-CM | POA: Diagnosis present

## 2018-10-16 DIAGNOSIS — K7460 Unspecified cirrhosis of liver: Secondary | ICD-10-CM | POA: Diagnosis present

## 2018-10-16 DIAGNOSIS — E860 Dehydration: Secondary | ICD-10-CM | POA: Diagnosis present

## 2018-10-16 DIAGNOSIS — Z20828 Contact with and (suspected) exposure to other viral communicable diseases: Secondary | ICD-10-CM | POA: Diagnosis present

## 2018-10-16 DIAGNOSIS — Z1612 Extended spectrum beta lactamase (ESBL) resistance: Secondary | ICD-10-CM | POA: Diagnosis present

## 2018-10-16 DIAGNOSIS — D696 Thrombocytopenia, unspecified: Secondary | ICD-10-CM | POA: Diagnosis present

## 2018-10-16 DIAGNOSIS — R197 Diarrhea, unspecified: Secondary | ICD-10-CM | POA: Diagnosis present

## 2018-10-16 DIAGNOSIS — D6959 Other secondary thrombocytopenia: Secondary | ICD-10-CM | POA: Diagnosis present

## 2018-10-16 DIAGNOSIS — I248 Other forms of acute ischemic heart disease: Secondary | ICD-10-CM | POA: Diagnosis present

## 2018-10-16 DIAGNOSIS — X58XXXA Exposure to other specified factors, initial encounter: Secondary | ICD-10-CM | POA: Diagnosis present

## 2018-10-16 DIAGNOSIS — K760 Fatty (change of) liver, not elsewhere classified: Secondary | ICD-10-CM | POA: Diagnosis present

## 2018-10-16 DIAGNOSIS — N179 Acute kidney failure, unspecified: Secondary | ICD-10-CM

## 2018-10-16 DIAGNOSIS — R945 Abnormal results of liver function studies: Secondary | ICD-10-CM

## 2018-10-16 DIAGNOSIS — I700 Atherosclerosis of aorta: Secondary | ICD-10-CM | POA: Diagnosis present

## 2018-10-16 DIAGNOSIS — R0602 Shortness of breath: Secondary | ICD-10-CM

## 2018-10-16 DIAGNOSIS — Z8249 Family history of ischemic heart disease and other diseases of the circulatory system: Secondary | ICD-10-CM

## 2018-10-16 DIAGNOSIS — R7989 Other specified abnormal findings of blood chemistry: Secondary | ICD-10-CM

## 2018-10-16 DIAGNOSIS — I129 Hypertensive chronic kidney disease with stage 1 through stage 4 chronic kidney disease, or unspecified chronic kidney disease: Secondary | ICD-10-CM | POA: Diagnosis present

## 2018-10-16 DIAGNOSIS — S2242XA Multiple fractures of ribs, left side, initial encounter for closed fracture: Secondary | ICD-10-CM | POA: Diagnosis present

## 2018-10-16 DIAGNOSIS — N183 Chronic kidney disease, stage 3 unspecified: Secondary | ICD-10-CM

## 2018-10-16 DIAGNOSIS — E559 Vitamin D deficiency, unspecified: Secondary | ICD-10-CM | POA: Diagnosis present

## 2018-10-16 DIAGNOSIS — Z87891 Personal history of nicotine dependence: Secondary | ICD-10-CM

## 2018-10-16 DIAGNOSIS — D649 Anemia, unspecified: Secondary | ICD-10-CM | POA: Diagnosis present

## 2018-10-16 DIAGNOSIS — A498 Other bacterial infections of unspecified site: Secondary | ICD-10-CM | POA: Diagnosis not present

## 2018-10-16 DIAGNOSIS — A4151 Sepsis due to Escherichia coli [E. coli]: Principal | ICD-10-CM | POA: Diagnosis present

## 2018-10-16 DIAGNOSIS — E876 Hypokalemia: Secondary | ICD-10-CM | POA: Diagnosis present

## 2018-10-16 DIAGNOSIS — I2510 Atherosclerotic heart disease of native coronary artery without angina pectoris: Secondary | ICD-10-CM | POA: Diagnosis present

## 2018-10-16 DIAGNOSIS — E871 Hypo-osmolality and hyponatremia: Secondary | ICD-10-CM | POA: Diagnosis present

## 2018-10-16 DIAGNOSIS — Z882 Allergy status to sulfonamides status: Secondary | ICD-10-CM | POA: Diagnosis not present

## 2018-10-16 DIAGNOSIS — R52 Pain, unspecified: Secondary | ICD-10-CM

## 2018-10-16 DIAGNOSIS — B9629 Other Escherichia coli [E. coli] as the cause of diseases classified elsewhere: Secondary | ICD-10-CM | POA: Diagnosis not present

## 2018-10-16 DIAGNOSIS — R7881 Bacteremia: Secondary | ICD-10-CM | POA: Diagnosis not present

## 2018-10-16 DIAGNOSIS — R112 Nausea with vomiting, unspecified: Secondary | ICD-10-CM

## 2018-10-16 DIAGNOSIS — B9620 Unspecified Escherichia coli [E. coli] as the cause of diseases classified elsewhere: Secondary | ICD-10-CM | POA: Diagnosis not present

## 2018-10-16 DIAGNOSIS — N390 Urinary tract infection, site not specified: Secondary | ICD-10-CM | POA: Diagnosis present

## 2018-10-16 DIAGNOSIS — Z79899 Other long term (current) drug therapy: Secondary | ICD-10-CM

## 2018-10-16 DIAGNOSIS — R6520 Severe sepsis without septic shock: Secondary | ICD-10-CM | POA: Diagnosis not present

## 2018-10-16 DIAGNOSIS — Z711 Person with feared health complaint in whom no diagnosis is made: Secondary | ICD-10-CM

## 2018-10-16 DIAGNOSIS — R0781 Pleurodynia: Secondary | ICD-10-CM | POA: Diagnosis not present

## 2018-10-16 LAB — CBC WITH DIFFERENTIAL/PLATELET
Abs Immature Granulocytes: 0.07 10*3/uL (ref 0.00–0.07)
Basophils Absolute: 0 10*3/uL (ref 0.0–0.1)
Basophils Relative: 0 %
Eosinophils Absolute: 0.1 10*3/uL (ref 0.0–0.5)
Eosinophils Relative: 1 %
HCT: 24.8 % — ABNORMAL LOW (ref 36.0–46.0)
Hemoglobin: 8.1 g/dL — ABNORMAL LOW (ref 12.0–15.0)
Immature Granulocytes: 1 %
Lymphocytes Relative: 6 %
Lymphs Abs: 0.6 10*3/uL — ABNORMAL LOW (ref 0.7–4.0)
MCH: 30 pg (ref 26.0–34.0)
MCHC: 32.7 g/dL (ref 30.0–36.0)
MCV: 91.9 fL (ref 80.0–100.0)
Monocytes Absolute: 1.5 10*3/uL — ABNORMAL HIGH (ref 0.1–1.0)
Monocytes Relative: 14 %
Neutro Abs: 8.6 10*3/uL — ABNORMAL HIGH (ref 1.7–7.7)
Neutrophils Relative %: 78 %
Platelets: 71 10*3/uL — ABNORMAL LOW (ref 150–400)
RBC: 2.7 MIL/uL — ABNORMAL LOW (ref 3.87–5.11)
RDW: 16.3 % — ABNORMAL HIGH (ref 11.5–15.5)
WBC: 10.9 10*3/uL — ABNORMAL HIGH (ref 4.0–10.5)
nRBC: 0 % (ref 0.0–0.2)

## 2018-10-16 LAB — URINALYSIS, ROUTINE W REFLEX MICROSCOPIC
Bilirubin Urine: NEGATIVE
Glucose, UA: NEGATIVE mg/dL
Ketones, ur: NEGATIVE mg/dL
Nitrite: NEGATIVE
Protein, ur: NEGATIVE mg/dL
Specific Gravity, Urine: 1.003 — ABNORMAL LOW (ref 1.005–1.030)
pH: 6 (ref 5.0–8.0)

## 2018-10-16 LAB — COMPREHENSIVE METABOLIC PANEL
ALT: 30 U/L (ref 0–44)
AST: 55 U/L — ABNORMAL HIGH (ref 15–41)
Albumin: 2.8 g/dL — ABNORMAL LOW (ref 3.5–5.0)
Alkaline Phosphatase: 122 U/L (ref 38–126)
Anion gap: 8 (ref 5–15)
BUN: 19 mg/dL (ref 8–23)
CO2: 16 mmol/L — ABNORMAL LOW (ref 22–32)
Calcium: 9 mg/dL (ref 8.9–10.3)
Chloride: 109 mmol/L (ref 98–111)
Creatinine, Ser: 1.59 mg/dL — ABNORMAL HIGH (ref 0.44–1.00)
GFR calc Af Amer: 40 mL/min — ABNORMAL LOW (ref >60)
GFR calc non Af Amer: 34 mL/min — ABNORMAL LOW (ref >60)
Glucose, Bld: 82 mg/dL (ref 70–99)
Potassium: 2.2 mmol/L — CL (ref 3.5–5.1)
Sodium: 133 mmol/L — ABNORMAL LOW (ref 135–145)
Total Bilirubin: 2.9 mg/dL — ABNORMAL HIGH (ref 0.3–1.2)
Total Protein: 5.7 g/dL — ABNORMAL LOW (ref 6.5–8.1)

## 2018-10-16 LAB — D-DIMER, QUANTITATIVE: D-Dimer, Quant: 1.93 ug/mL-FEU — ABNORMAL HIGH (ref 0.00–0.50)

## 2018-10-16 LAB — BRAIN NATRIURETIC PEPTIDE: B Natriuretic Peptide: 357.7 pg/mL — ABNORMAL HIGH (ref 0.0–100.0)

## 2018-10-16 LAB — FIBRINOGEN: Fibrinogen: 201 mg/dL — ABNORMAL LOW (ref 210–475)

## 2018-10-16 LAB — C-REACTIVE PROTEIN: CRP: 0.9 mg/dL (ref <1.0)

## 2018-10-16 LAB — PROCALCITONIN: Procalcitonin: 13.13 ng/mL

## 2018-10-16 LAB — TRIGLYCERIDES: Triglycerides: 96 mg/dL (ref <150)

## 2018-10-16 LAB — LACTIC ACID, PLASMA
Lactic Acid, Venous: 3.8 mmol/L (ref 0.5–1.9)
Lactic Acid, Venous: 2.2 mmol/L (ref 0.5–1.9)

## 2018-10-16 LAB — LACTATE DEHYDROGENASE: LDH: 131 U/L (ref 98–192)

## 2018-10-16 LAB — FERRITIN: Ferritin: 28 ng/mL (ref 11–307)

## 2018-10-16 LAB — TROPONIN I (HIGH SENSITIVITY)
Troponin I (High Sensitivity): 115 ng/L (ref <18)
Troponin I (High Sensitivity): 111 ng/L (ref <18)

## 2018-10-16 LAB — SARS CORONAVIRUS 2 BY RT PCR (HOSPITAL ORDER, PERFORMED IN ~~LOC~~ HOSPITAL LAB): SARS Coronavirus 2: NEGATIVE

## 2018-10-16 MED ORDER — SODIUM CHLORIDE 0.9 % IV SOLN
INTRAVENOUS | Status: DC
  Administered 2018-10-16 – 2018-10-17 (×2): via INTRAVENOUS

## 2018-10-16 MED ORDER — ONDANSETRON HCL 4 MG PO TABS: 4.0000 mg | ORAL_TABLET | Freq: Four times a day (QID) | ORAL | Status: DC | PRN

## 2018-10-16 MED ORDER — SODIUM CHLORIDE 0.9 % IV SOLN: 2.0000 g | Freq: Once | INTRAVENOUS | Status: DC

## 2018-10-16 MED ORDER — VANCOMYCIN HCL IN DEXTROSE 1-5 GM/200ML-% IV SOLN: 1000.0000 mg | Freq: Once | INTRAVENOUS | Status: DC

## 2018-10-16 MED ORDER — METOCLOPRAMIDE HCL 5 MG/ML IJ SOLN
10.0000 mg | Freq: Once | INTRAMUSCULAR | Status: AC
  Administered 2018-10-16: 10 mg via INTRAVENOUS
  Filled 2018-10-16: qty 2

## 2018-10-16 MED ORDER — MAGNESIUM SULFATE 2 GM/50ML IV SOLN
2.0000 g | Freq: Once | INTRAVENOUS | Status: AC
  Administered 2018-10-16: 2 g via INTRAVENOUS
  Filled 2018-10-16: qty 50

## 2018-10-16 MED ORDER — VANCOMYCIN HCL IN DEXTROSE 1-5 GM/200ML-% IV SOLN
1000.0000 mg | Freq: Once | INTRAVENOUS | Status: AC
  Administered 2018-10-16: 1000 mg via INTRAVENOUS
  Filled 2018-10-16: qty 200

## 2018-10-16 MED ORDER — ONDANSETRON HCL 4 MG/2ML IJ SOLN
4.0000 mg | Freq: Four times a day (QID) | INTRAMUSCULAR | Status: DC | PRN
  Administered 2018-10-17 – 2018-10-18 (×2): 4 mg via INTRAVENOUS
  Filled 2018-10-16 (×2): qty 2

## 2018-10-16 MED ORDER — POTASSIUM CHLORIDE 10 MEQ/100ML IV SOLN
10.0000 meq | Freq: Once | INTRAVENOUS | Status: AC
  Administered 2018-10-16: 10 meq via INTRAVENOUS
  Filled 2018-10-16: qty 100

## 2018-10-16 MED ORDER — POTASSIUM CHLORIDE IN NACL 20-0.9 MEQ/L-% IV SOLN
Freq: Once | INTRAVENOUS | Status: AC
  Administered 2018-10-16: 16:00:00 via INTRAVENOUS
  Filled 2018-10-16: qty 1000

## 2018-10-16 MED ORDER — ACETAMINOPHEN 325 MG PO TABS
650.0000 mg | ORAL_TABLET | Freq: Four times a day (QID) | ORAL | Status: DC | PRN
  Administered 2018-10-16 – 2018-10-18 (×4): 650 mg via ORAL
  Filled 2018-10-16 (×4): qty 2

## 2018-10-16 MED ORDER — ONDANSETRON HCL 4 MG/2ML IJ SOLN
4.0000 mg | Freq: Once | INTRAMUSCULAR | Status: AC
  Administered 2018-10-16: 4 mg via INTRAVENOUS
  Filled 2018-10-16: qty 2

## 2018-10-16 MED ORDER — POTASSIUM CHLORIDE CRYS ER 20 MEQ PO TBCR
40.0000 meq | EXTENDED_RELEASE_TABLET | Freq: Once | ORAL | Status: AC
  Administered 2018-10-16: 40 meq via ORAL
  Filled 2018-10-16: qty 2

## 2018-10-16 MED ORDER — POTASSIUM CHLORIDE 10 MEQ/100ML IV SOLN: 10.0000 meq | INTRAVENOUS | Status: DC

## 2018-10-16 MED ORDER — SODIUM CHLORIDE 0.9 % IV BOLUS
500.0000 mL | Freq: Once | INTRAVENOUS | Status: AC
  Administered 2018-10-16: 500 mL via INTRAVENOUS

## 2018-10-16 MED ORDER — VITAMIN B-1 100 MG PO TABS
100.0000 mg | ORAL_TABLET | Freq: Every day | ORAL | Status: DC
  Administered 2018-10-16 – 2018-10-24 (×9): 100 mg via ORAL
  Filled 2018-10-16 (×9): qty 1

## 2018-10-16 MED ORDER — FAMOTIDINE IN NACL 20-0.9 MG/50ML-% IV SOLN
20.0000 mg | Freq: Once | INTRAVENOUS | Status: AC
  Administered 2018-10-16: 20 mg via INTRAVENOUS
  Filled 2018-10-16: qty 50

## 2018-10-16 MED ORDER — POTASSIUM CHLORIDE 10 MEQ/100ML IV SOLN
10.0000 meq | INTRAVENOUS | Status: AC
  Administered 2018-10-16 – 2018-10-17 (×4): 10 meq via INTRAVENOUS
  Filled 2018-10-16 (×4): qty 100

## 2018-10-16 MED ORDER — DIPHENHYDRAMINE HCL 50 MG/ML IJ SOLN
25.0000 mg | Freq: Once | INTRAMUSCULAR | Status: AC
  Administered 2018-10-16: 25 mg via INTRAVENOUS
  Filled 2018-10-16: qty 1

## 2018-10-16 MED ORDER — SODIUM CHLORIDE 0.9 % IV SOLN
2.0000 g | Freq: Once | INTRAVENOUS | Status: AC
  Administered 2018-10-16: 2 g via INTRAVENOUS
  Filled 2018-10-16: qty 2

## 2018-10-16 MED ORDER — SODIUM CHLORIDE 0.9% FLUSH
3.0000 mL | Freq: Two times a day (BID) | INTRAVENOUS | Status: DC
  Administered 2018-10-16 – 2018-10-23 (×7): 3 mL via INTRAVENOUS

## 2018-10-16 MED ORDER — FOLIC ACID 1 MG PO TABS
1.0000 mg | ORAL_TABLET | Freq: Every day | ORAL | Status: DC
  Administered 2018-10-16 – 2018-10-24 (×9): 1 mg via ORAL
  Filled 2018-10-16 (×9): qty 1

## 2018-10-16 NOTE — ED Notes (Signed)
Attempted to give report to receiving RN. She will call back when able to receive report.

## 2018-10-16 NOTE — H&P (Addendum)
History and Physical  Dana Watkins SHF:[id number] DOB: May 17, 1955 DOA: 10/16/2018  PCP: Lawerance Cruel, MD   Chief Complaint: Cough and fever  HPI:  63 year old woman PMH chronic thrombocytopenia, anemia, CKD stage III PRESENTED to the emergency department 10/5 for fever, cough, nausea, retching, myalgias.  Admitted for febrile illness, suspected false-negative COVID, multiple lab abnormalities.  Patient reports her symptoms began 10/1 with fever, cough, nausea with refractory dry heaves, shortness of breath, no specific aggravating or alleviating factors.  He has not been able to eat or keep anything on her stomach.  Even water comes back up.  Symptoms continue to worsen so she came to the emergency department.  Patient returned October 1 from spending a week in [place].  She flew by private check.  No history of blood clots.  Chart review: . Hospitalization June 2020: Sepsis secondary to UTI  . Hematology office visit 06/29/2017: Thrombocytopenia, anemia, etiology unclear.  Plan was to follow-up in 3 months. . Cardiology office visit 05/2017: CAD. [facility]land Kitchen Neurology office visit April 2018: Chronic fatigue, abnormal liver function tests, no further neurologic testing advised  ED Course: Notes reviewed, patient recently traveled to [place], presented with cough, shortness of breath, nausea, vomiting, body aches, fever, unsteadiness on feet, 97% on room air, temp 100.1.  Sick for about 3 days.  Fever as high as 102, nausea, cough, shortness of breath.  Thought she was hallucinating couple days ago.  Found to have lactic acidosis, SARS-CoV-2 negative.  Vitals are stable.  Treated with Reglan, Benadryl, Zofran, cefepime, potassium, fluids, vancomycin.  Review of Systems:  Negative for visual changes, sore throat, rash, chest pain, dysuria, bleeding, abdominal pain, diarrhea  Positive for myalgias, left-sided rib pain from coughing and retching, urinary frequency  PMH . Thrombocytopenia and  anemia of unclear etiology, previously followed by oncology . CKD stage III . Vitamin D deficiency . CAD  PSH . Cesarean section, tonsils and adenoids, left heart catheterization  Social History . Former smoker, occasional alcohol, no drugs  Allergies . Sulfa   Family history includes: . Mother had atrial fibrillation   Meds include: . Folic acid, potassium 40 mEq twice daily, multivitamin, sodium bicarbonate twice daily, thiamine 100 mg daily, vitamin D3  Physicial Exam   Vitals:  . 98.9, pulse 75, blood pressure 126/76, respirations 22, SPO2 100% on room air  Constitutional:   . Appears calm, ill, uncomfortable, nontoxic Eyes:  . pupils and irises appear normal . Normal lids  ENMT:  . grossly normal hearing  . Lips appear normal Respiratory:  . CTA bilaterally, no w/r/r.  . Respiratory effort normal. Cardiovascular:  . RRR, no m/r/g . No LE extremity edema   Abdomen:  . Soft, nontender, nondistended.  No hernia noted. Musculoskeletal:  . RUE, LUE, RLE, LLE   . strength and tone appear grossly normal Skin:  . No rashes, lesions, ulcers . palpation of skin: no induration or nodules Neurologic:  . Grossly unremarkable Psychiatric:  . Mental status o Mood, affect appropriate . judgment and insight appear intact    I have personally reviewed following labs and imaging studies  Labs:  Marland Kitchen Potassium 2.2, CO2 16, creatinine 1.59, AST 55, total bilirubin 2.9 . BNP 357 . High-sensitivity troponin 115, 111 . Lactic acid 3.8 > 2.2 . Procalcitonin 13.13 . WBC 10.9, hemoglobin 8.1, platelets 70 . Urinalysis grossly abnormal  Imaging studies:   Chest x-ray no acute disease  Medical tests:   EKG independently reviewed: Sinus rhythm with LVH with  repolarization abnormality, no acute changes  Significant Hospital Events   . 10/5 admitted for cough, shortness of breath, multiple lab abnormalities, suspected false negative COVID   Consults:  .     Procedures:  .   Significant Diagnostic Tests:  . 10/5 chest x-ray central venous congestion no overt edema or focal consolidation.   Micro Data:  . 10/5 blood cultures > . 10/5 urine culture >   Antimicrobials:  . Cefepime 10/5 > . Vancomycin 10/5 >   ASSESSMENT/PLAN  63 year old woman presents with cough, shortness of breath, nausea, dry heaves, found to have multiple laboratory abnormalities without clear unifying diagnosis.  SIRS, fever, nausea, dry heaves, shortness of breath, cough suspicious for false-negative COVID, cannot rule out sepsis at this point --Continue airborne precaution.  Consider retesting in 48 hours if no other more likely etiology presents.   --Overall inflammatory markers reassuring.  No hypoxia point.  CRP within normal limits, procalcitonin high, lactic acid trending down consistent with dehydration and not sepsis.  Check inflammatory markers again in a.m. --Continue empiric broad-spectrum antibiotics, follow-up culture data --At this point cannot rule out occult pneumonia.  Hopefully pursue CT chest in a.m. --Rule out flu  AKI superimposed on CKD stage III --Almost certainly secondary to poor oral intake for the last 5 days and dry heaves. --Aggressive IV fluids.  Check CMP in a.m. --Strict intake, output  UTI suspected --No dysuria but she does have frequency --Continue antibiotic  Hypokalemia, severe --Secondary to inability to tolerate oral intake secondary to refractory dry heaves. --Aggressive repletion.  Empiric magnesium. --BMP this evening and in a.m.  Chronic thrombocytopenia appears to be at baseline --Followed in the past by hematology, etiology obscure.  May be secondary to splenomegaly  Elevated AST, total bilirubin may be secondary to early cirrhosis or dehydration. --Chronic hyperbilirubinemia dating back to at least  May 2020 --Chronic AST elevation dating back to 2012 --CMP in a.m.  Elevated troponin, flat --No chest pain.   EKG nonacute.  Significance unclear.  Suspect demand ischemia secondary to acute illness. --Echocardiogram in a.m.  Elevated d-dimer --Significance unclear.  She has traveled recently, but it was a short flight on a private jet.  Has no leg edema, no hypoxia, no hemodynamic instability.  VTE does not seem likely to explain her presentation.  Given her thrombocytopenia and low suspicion for PE, will not anticoagulate at this point.  Bedrest with bathroom privileges. --BMP in a.m., hopefully can pursue CTA chest to fully will out PE, further define lung anatomy --Bilateral lower extremity venous Dopplers, although she has no edema, signs or symptoms of DVT  Chronic anemia --Suspect anemia of chronic disease  Probable early cirrhosis by CT June 2020, splenomegaly --Follow-up with GI as an outpatient  Several indeterminate left hepatic lobe lesions largest of which 3.4 cm seen on ultrasound 06/2014 --Consider outpatient MRI liver protocol  Aortic atherosclerosis  DVT prophylaxis:TED hose (low plts) Code Status: Full Family Communication: none Consults called: none    Time spent: 90 minutes  Murray Hodgkins, MD  [facility] Direct contact: see [email or web address]  7PM-7AM contact night coverage as below   1. Check the care team in [facility] and look for a) attending/consulting [facility] provider listed and b) the [facility] team listed 2. Log into [email or web address] and use [place] universal password to access. If you do not have the password, please contact the hospital operator. 3. Locate the [facility] provider you are looking for under [facility] and page to a number that  you can be directly reached. 4. If you still have difficulty reaching the provider, please page the [facility] (Director on Call) for the Hospitalists listed on amion for assistance.  Severity of Illness: The appropriate patient status for this patient is INPATIENT. Inpatient status is judged to be reasonable and necessary in order to  provide the required intensity of service to ensure the patient's safety. The patient's presenting symptoms, physical exam findings, and initial radiographic and laboratory data in the context of their chronic comorbidities is felt to place them at high risk for further clinical deterioration. Furthermore, it is not anticipated that the patient will be medically stable for discharge from the hospital within 2 midnights of admission. The following factors support the patient status of inpatient.   " The patient's presenting symptoms include fever, shortness of breath, cough, nausea, dry heaves. " The worrisome physical exam findings include cough. " The initial radiographic and laboratory data are worrisome because of severe hypokalemia, elevated troponin, elevated d-dimer, elevated total bilirubin. " The chronic co-morbidities include chronic thrombocytopenia, splenomegaly.   * I certify that at the point of admission it is my clinical judgment that the patient will require inpatient hospital care spanning beyond 2 midnights from the point of admission due to high intensity of service, high risk for further deterioration and high frequency of surveillance required.*   10/16/2018, 8:00 PM   Principal Problem:   SIRS (systemic inflammatory response syndrome) (HCC) Active Problems:   Thrombocytopenia (HCC)   Hypokalemia   CKD (chronic kidney disease), stage III   Acute lower UTI   Elevated troponin   Elevated d-dimer

## 2018-10-16 NOTE — ED Provider Notes (Signed)
[facility] DEPT Provider Note   CSN: [id number] Arrival date & time: 10/16/18  1008     History   Chief Complaint Chief Complaint  Patient presents with  . Shortness of Breath    COVID?  Marland Kitchen Cough  . Fever  . Generalized Body Aches    HPI Dana Watkins is a 63 y.o. female.     HPI Patient reports that she has been feeling sick for about the past 3 days.  She reports she has had body aches and fevers.  She reports temperatures have been as high as 102 at home.  She reports that she has been particularly nauseated.  She has gotten a lot of dry heaves but is not really bringing anything up.  She reports that she has had cough and some shortness of breath.  No chest pain.  Patient reports he has had a fairly sore throat which she attributes to all the vomiting and dry heaves.  No difficulty swallowing.  Patient reports she is had a generalized headache.  Patient's mental status is clear at this time.  She however reports that she thinks that she was hallucinating a couple of days ago.  She reports her friend her companion noted that she seemed to be saying some unusual things.  No lower extremity swelling or calf pain.  No urinary symptoms. Past Medical History:  Diagnosis Date  . Acute hypokalemia 11/26/2014  . Arthritis 05/15/2015  . Benign essential hypertension 05/15/2015  . C. difficile colitis   . CAD (coronary artery disease)    a. cath 04/2017: ""Diffuse, calcific CAD particularly in the distal RCA and proximal to mid LAD.  LAD disease is nonobstructive.  RCA disease is more severe but does not appear significant.  Given her lack of symptoms, would pursue medical therapy."  . CKD (chronic kidney disease), stage III   . Hypokalemia   . Migraine headache 05/15/2015  . Mild mitral regurgitation   . Pancytopenia ([place])   . Sepsis ([place]) 01/2017  . Thrombocytopenia ([place])    a. chronic thrombocytopenia ([facility] - remotely saw hematology).  . Vitamin D  deficiency     Patient Active Problem List   Diagnosis Date Noted  . UTI (urinary tract infection) 06/25/2018  . Community acquired pneumonia of right middle lobe of lung 06/09/2018  . AKI (acute kidney injury) ([place]) 06/09/2018  . GAD (generalized anxiety disorder) 11/06/2017  . Abnormal findings on diagnostic imaging of heart and coronary circulation   . Family history of coronary artery disease 05/09/2017  . DOE (dyspnea on exertion) 05/09/2017  . Coronary artery calcification seen on CT scan 03/08/2017  . Acute bronchitis 02/06/2017  . Dehydration   . Diarrhea 02/03/2017  . C. difficile colitis 02/03/2017  . Arthritis 05/15/2015  . Benign essential hypertension 05/15/2015  . Migraine headache 05/15/2015  . Hypokalemia 11/26/2014  . Elevated alkaline phosphatase level 11/26/2014  . Abnormal weight gain 11/26/2014  . Chronic leukopenia 05/28/2014  . Anemia of chronic illness 05/28/2014  . Thrombocytopenia ([place]) 04/30/2013  . Elevated liver function tests 04/30/2013    Past Surgical History:  Procedure Laterality Date  . CESAREAN SECTION     24 years ago  . LEFT HEART CATH AND CORONARY ANGIOGRAPHY N/A 05/12/2017   Procedure: LEFT HEART CATH AND CORONARY ANGIOGRAPHY;  Surgeon: [name] Booze, MD;  Location: [facility];  Service: Cardiovascular;  Laterality: N/A;  . tonsils and adneoids     as a child  .  ULTRASOUND GUIDANCE FOR VASCULAR ACCESS  05/12/2017   Procedure: Ultrasound Guidance For Vascular Access;  Surgeon: [name] Booze, MD;  Location: [facility];  Service: Cardiovascular;;     OB History   No obstetric history on file.      Home Medications    Prior to Admission medications   Medication Sig Start Date End Date Taking? Authorizing Provider  Cholecalciferol (VITAMIN D3) 2000 units TABS Take 1 tablet by mouth daily.    [provider]  folic acid (FOLVITE) 1 MG tablet Take 1 tablet (1 mg total) by mouth daily. 06/29/18    Sheikh, Omair Latif, DO  KLOR-CON M20 20 MEQ tablet Take 40 mEq by mouth 2 (two) times a day. 06/22/18   [provider]  MAGNESIUM PO Take 1 tablet by mouth daily.     [provider]  Multiple Vitamin (MULTIVITAMIN WITH MINERALS) TABS tablet Take 1 tablet by mouth daily. 06/29/18   Raiford Noble Latif, DO  phosphorus (K PHOS NEUTRAL) [phone] MG tablet Take 2 tablets (500 mg total) by mouth daily. 06/28/18   Raiford Noble Latif, DO  polyvinyl alcohol (LIQUIFILM TEARS) 1.4 % ophthalmic solution Place 1 drop into both eyes as needed for dry eyes.    [provider]  saccharomyces boulardii (FLORASTOR) 250 MG capsule Take 1 capsule (250 mg total) by mouth 2 (two) times daily. 06/28/18   Raiford Noble Latif, DO  sodium bicarbonate 650 MG tablet Take 1 tablet (650 mg total) by mouth 2 (two) times daily. 06/28/18   Raiford Noble Latif, DO  thiamine 100 MG tablet Take 1 tablet (100 mg total) by mouth daily. 06/29/18   Kerney Elbe, DO    Family History Family History  Problem Relation Age of Onset  . Pernicious anemia Maternal Grandfather   . Heart attack Maternal Grandfather   . Atrial fibrillation Mother   . Supraventricular tachycardia Father   . Heart disease Paternal Grandfather     Social History Social History   Tobacco Use  . Smoking status: Former Smoker    Years: 2.00    Start date: 06/25/1968    Quit date: 05/16/2014    Years since quitting: 4.4  . Smokeless tobacco: Never Used  Substance Use Topics  . Alcohol use: Yes    Alcohol/week: 1.0 standard drinks    Types: 1 Glasses of wine per week    Comment: occ  . Drug use: No     Allergies   Sulfa antibiotics   Review of Systems Review of Systems 10 Systems reviewed and are negative for acute change except as noted in the HPI.   Physical Exam Updated Vital Signs BP 119/68   Pulse 77   Temp 98.4 F (36.9 C) (Oral)   Resp (!) 24   SpO2 98%   Physical Exam Constitutional:       Comments: Patient appears moderately ill and fatigued.  She has intermittent cough.  Mental status is clear.  Mild increased work of breathing at rest.  HENT:     Head: Normocephalic and atraumatic.     Mouth/Throat:     Mouth: Mucous membranes are moist.     Pharynx: Oropharynx is clear.  Eyes:     Extraocular Movements: Extraocular movements intact.     Pupils: Pupils are equal, round, and reactive to light.  Neck:     Musculoskeletal: Neck supple.  Cardiovascular:     Rate and Rhythm: Normal rate and regular rhythm.  Pulmonary:  Comments: Intermittent paroxysmal cough.  Breath sounds slightly diminished at the bases.  No gross wheeze or rale. Abdominal:     Comments: Abdomen is soft.  Moderate epigastric tenderness to palpation.  No guarding.  Lower abdomen nontender.  Musculoskeletal: Normal range of motion.        General: No swelling or tenderness.     Comments: Patient does not have any peripheral edema.  Calves are soft nontender.  Muscular condition and skin condition is very good.  Skin:    General: Skin is warm and dry.  Neurological:     General: No focal deficit present.     Mental Status: She is oriented to person, place, and time.     Cranial Nerves: No cranial nerve deficit.     Coordination: Coordination normal.  Psychiatric:        Mood and Affect: Mood normal.      ED Treatments / Results  Labs (all labs ordered are listed, but only abnormal results are displayed) Labs Reviewed  LACTIC ACID, PLASMA - Abnormal; Notable for the following components:      Result Value   Lactic Acid, Venous 3.8 (*)    All other components within normal limits  CBC WITH DIFFERENTIAL/PLATELET - Abnormal; Notable for the following components:   WBC 10.9 (*)    RBC 2.70 (*)    Hemoglobin 8.1 (*)    HCT 24.8 (*)    RDW 16.3 (*)    Platelets 71 (*)    Neutro Abs 8.6 (*)    Lymphs Abs 0.6 (*)    Monocytes Absolute 1.5 (*)    All other components within normal limits   COMPREHENSIVE METABOLIC PANEL - Abnormal; Notable for the following components:   Sodium 133 (*)    Potassium 2.2 (*)    CO2 16 (*)    Creatinine, Ser 1.59 (*)    Total Protein 5.7 (*)    Albumin 2.8 (*)    AST 55 (*)    Total Bilirubin 2.9 (*)    GFR calc non Af Amer 34 (*)    GFR calc Af Amer 40 (*)    All other components within normal limits  D-DIMER, QUANTITATIVE (NOT AT [facility]) - Abnormal; Notable for the following components:   D-Dimer, Quant 1.93 (*)    All other components within normal limits  FIBRINOGEN - Abnormal; Notable for the following components:   Fibrinogen 201 (*)    All other components within normal limits  BRAIN NATRIURETIC PEPTIDE - Abnormal; Notable for the following components:   B Natriuretic Peptide 357.7 (*)    All other components within normal limits  TROPONIN I (HIGH SENSITIVITY) - Abnormal; Notable for the following components:   Troponin I (High Sensitivity) 115 (*)    All other components within normal limits  SARS CORONAVIRUS 2 (HOSPITAL ORDER, [facility])  CULTURE, BLOOD (ROUTINE X 2)  CULTURE, BLOOD (ROUTINE X 2)  PROCALCITONIN  LACTATE DEHYDROGENASE  FERRITIN  TRIGLYCERIDES  C-REACTIVE PROTEIN  LACTIC ACID, PLASMA  TROPONIN I (HIGH SENSITIVITY)    EKG EKG Interpretation  Date/Time:  Monday October 16 2018 10:40:19 EDT Ventricular Rate:  84 PR Interval:    QRS Duration: 105 QT Interval:  407 QTC Calculation: 482 R Axis:   19 Text Interpretation:  Sinus rhythm Probable LVH with secondary repol abnrm agree. no sig change from previous Confirmed by Charlesetta Shanks [phone]) on 10/16/2018 12:03:08 PM   Radiology Dg Chest Portable 1 View  Result Date: 10/16/2018 CLINICAL DATA:  Patient arrived via [facility] from home. Pt c/o cough, SOB, N/V, body aches and fever. Pt stated she started "feeling weird" on Thursday and has progressed. Patient stated she took tylenol before EMS arrived. Medical hx of HTN. EXAM:  PORTABLE CHEST 1 VIEW COMPARISON:  Chest radiograph 06/09/2018, 06/25/2018 FINDINGS: Stable cardiomediastinal contours with enlarged heart size. Central venous congestion. No overt edema or new focal consolidation. No pneumothorax or large pleural effusion. Osteopenia. IMPRESSION: Cardiomegaly with central venous congestion. No overt edema or focal consolidation. Electronically Signed   By: Audie Pinto M.D.   On: 10/16/2018 11:31    Procedures Procedures (including critical care time)  Medications Ordered in ED Medications  ondansetron (ZOFRAN) injection 4 mg (has no administration in time range)  potassium chloride 10 mEq in 100 mL IVPB (has no administration in time range)  0.9 % NaCl with KCl 20 mEq/ L  infusion (has no administration in time range)  famotidine (PEPCID) IVPB 20 mg premix (has no administration in time range)  metoCLOPramide (REGLAN) injection 10 mg (has no administration in time range)  diphenhydrAMINE (BENADRYL) injection 25 mg (has no administration in time range)  ceFEPIme (MAXIPIME) 2 g in sodium chloride 0.9 % 100 mL IVPB (has no administration in time range)  vancomycin (VANCOCIN) IVPB 1000 mg/200 mL premix (has no administration in time range)  sodium chloride 0.9 % bolus 500 mL (has no administration in time range)     Initial Impression / Assessment and Plan / ED Course  I have reviewed the triage vital signs and the nursing notes.  Pertinent labs & imaging results that were available during my care of the patient were reviewed by me and considered in my medical decision making (see chart for details).       Patient presents with multiple symptoms including fever, cough, nausea vomiting, headache, myalgia, mild confusion.  She does have hypokalemia and worsening of some mild baseline AKI.  Patient symptoms were highly concerning for coronavirus.  At this time however, her testing is negative.  With combination of lactic acidosis, fever, cough will  initiate broad-spectrum antibiotics for sepsis.  Still need return of urinalysis.  Patient also has mild elevation of troponin but EKG is not showing any acute ischemic pattern.  Symptoms are much more suggestive of infectious etiologies and cardiac ischemia.  May need to also consider PE although again, symptoms generally are more consistent with a infectious etiology.  Patient does have prior history of sepsis due to UTI in June of this year.  At that time there was a somewhat similar presentation of generalized illness with nausea and vomiting.  Patient has chronic pancytopenia but white count is slightly elevated above her baseline. Final Clinical Impressions(s) / ED Diagnoses   Final diagnoses:  SIRS (systemic inflammatory response syndrome) (HCC)  Hypokalemia  Intractable nausea and vomiting  Troponin level elevated    ED Discharge Orders    None       Charlesetta Shanks, MD 10/16/18 1327

## 2018-10-16 NOTE — Progress Notes (Signed)
CRITICAL VALUE ALERT  Critical Value:  Temp 102.65F oral  Date & Time Notied: 10-16-2018 @ 2145  Provider Notified: Baltazar Najjar, NP  Orders Received/Actions taken: awaiting orders

## 2018-10-16 NOTE — ED Notes (Signed)
Date and time results received: 10/16/18 11:40  Test: lactic acid Critical Value: 3.8  Name of Provider Notified:Pfeiffer

## 2018-10-16 NOTE — ED Notes (Signed)
Pfeiffer, MD at bedside.

## 2018-10-16 NOTE — ED Triage Notes (Addendum)
Patient arrived via [facility] from home.   Recent travel to [place].   C/O cough, shob, N/V,body aches, and fever.   Started "feeling weird" on Thursday and has progressed.   Patient states she took tylenol before ems arrived.   A/Ox4 Unsteady on feet due to weakness per ems   124/70 P-90 RR-20 97% RA CBG-121 Temp-100.1

## 2018-10-17 ENCOUNTER — Inpatient Hospital Stay (HOSPITAL_COMMUNITY): Payer: Self-pay

## 2018-10-17 ENCOUNTER — Inpatient Hospital Stay (HOSPITAL_COMMUNITY): Payer: Medicare Other

## 2018-10-17 DIAGNOSIS — I340 Nonrheumatic mitral (valve) insufficiency: Secondary | ICD-10-CM

## 2018-10-17 DIAGNOSIS — I361 Nonrheumatic tricuspid (valve) insufficiency: Secondary | ICD-10-CM

## 2018-10-17 LAB — COMPREHENSIVE METABOLIC PANEL
ALT: 27 U/L (ref 0–44)
AST: 44 U/L — ABNORMAL HIGH (ref 15–41)
Albumin: 2.5 g/dL — ABNORMAL LOW (ref 3.5–5.0)
Alkaline Phosphatase: 112 U/L (ref 38–126)
Anion gap: 7 (ref 5–15)
BUN: 15 mg/dL (ref 8–23)
CO2: 17 mmol/L — ABNORMAL LOW (ref 22–32)
Calcium: 8.6 mg/dL — ABNORMAL LOW (ref 8.9–10.3)
Chloride: 115 mmol/L — ABNORMAL HIGH (ref 98–111)
Creatinine, Ser: 1.33 mg/dL — ABNORMAL HIGH (ref 0.44–1.00)
GFR calc Af Amer: 50 mL/min — ABNORMAL LOW (ref >60)
GFR calc non Af Amer: 43 mL/min — ABNORMAL LOW (ref >60)
Glucose, Bld: 100 mg/dL — ABNORMAL HIGH (ref 70–99)
Potassium: 2.9 mmol/L — ABNORMAL LOW (ref 3.5–5.1)
Sodium: 139 mmol/L (ref 135–145)
Total Bilirubin: 3 mg/dL — ABNORMAL HIGH (ref 0.3–1.2)
Total Protein: 5.2 g/dL — ABNORMAL LOW (ref 6.5–8.1)

## 2018-10-17 LAB — CBC
HCT: 24 % — ABNORMAL LOW (ref 36.0–46.0)
Hemoglobin: 7.6 g/dL — ABNORMAL LOW (ref 12.0–15.0)
MCH: 29.9 pg (ref 26.0–34.0)
MCHC: 31.7 g/dL (ref 30.0–36.0)
MCV: 94.5 fL (ref 80.0–100.0)
Platelets: 61 10*3/uL — ABNORMAL LOW (ref 150–400)
RBC: 2.54 MIL/uL — ABNORMAL LOW (ref 3.87–5.11)
RDW: 16.6 % — ABNORMAL HIGH (ref 11.5–15.5)
WBC: 6.4 10*3/uL (ref 4.0–10.5)
nRBC: 0 % (ref 0.0–0.2)

## 2018-10-17 LAB — BLOOD CULTURE ID PANEL (REFLEXED)

## 2018-10-17 LAB — PHOSPHORUS: Phosphorus: 1.7 mg/dL — ABNORMAL LOW (ref 2.5–4.6)

## 2018-10-17 LAB — ECHOCARDIOGRAM COMPLETE
Height: 68 in
Weight: 2320 oz

## 2018-10-17 LAB — MAGNESIUM: Magnesium: 2.5 mg/dL — ABNORMAL HIGH (ref 1.7–2.4)

## 2018-10-17 LAB — C-REACTIVE PROTEIN: CRP: 0.8 mg/dL (ref <1.0)

## 2018-10-17 LAB — D-DIMER, QUANTITATIVE: D-Dimer, Quant: 1.27 ug/mL-FEU — ABNORMAL HIGH (ref 0.00–0.50)

## 2018-10-17 LAB — FERRITIN: Ferritin: 28 ng/mL (ref 11–307)

## 2018-10-17 MED ORDER — POTASSIUM CHLORIDE IN NACL 40-0.9 MEQ/L-% IV SOLN
INTRAVENOUS | Status: DC
  Administered 2018-10-17 – 2018-10-18 (×3): 75 mL/h via INTRAVENOUS
  Filled 2018-10-17 (×3): qty 1000

## 2018-10-17 MED ORDER — POTASSIUM CHLORIDE CRYS ER 20 MEQ PO TBCR
40.0000 meq | EXTENDED_RELEASE_TABLET | ORAL | Status: AC
  Administered 2018-10-17 (×2): 40 meq via ORAL
  Filled 2018-10-17 (×2): qty 2

## 2018-10-17 MED ORDER — VANCOMYCIN HCL IN DEXTROSE 750-5 MG/150ML-% IV SOLN
750.0000 mg | INTRAVENOUS | Status: DC
  Filled 2018-10-17: qty 150

## 2018-10-17 MED ORDER — LORAZEPAM 0.5 MG PO TABS
0.5000 mg | ORAL_TABLET | Freq: Once | ORAL | Status: AC
  Administered 2018-10-17: 0.5 mg via ORAL
  Filled 2018-10-17: qty 1

## 2018-10-17 MED ORDER — SODIUM CHLORIDE 0.9 % IV SOLN
2.0000 g | Freq: Two times a day (BID) | INTRAVENOUS | Status: DC
  Administered 2018-10-17: 2 g via INTRAVENOUS
  Filled 2018-10-17: qty 2

## 2018-10-17 MED ORDER — SODIUM CHLORIDE 0.9 % IV SOLN
2.0000 g | INTRAVENOUS | Status: DC
  Administered 2018-10-17: 09:00:00 2 g via INTRAVENOUS
  Filled 2018-10-17: qty 20
  Filled 2018-10-17: qty 2

## 2018-10-17 MED ORDER — MAGNESIUM SULFATE 2 GM/50ML IV SOLN: 2.0000 g | Freq: Once | INTRAVENOUS | Status: DC

## 2018-10-17 MED ORDER — SODIUM CHLORIDE 0.9 % IV SOLN: INTRAVENOUS | Status: DC

## 2018-10-17 NOTE — Progress Notes (Signed)
PHARMACY - PHYSICIAN COMMUNICATION CRITICAL VALUE ALERT - BLOOD CULTURE IDENTIFICATION (BCID)  Dana Watkins is an 64 y.o. female who presented to [facility] on 10/16/2018 with a chief complaint of cough and fever  Assessment:  Patient started on Vancomycin, cefepime but BCID already resulted and E. Coli noted. (include suspected source if known)  Name of physician (or Provider) Contacted: none  Current antibiotics: Vancomycin, cefepime   Changes to prescribed antibiotics recommended:  Change abx to Ceftriaxone 2gm iv q24hr--per algorithm/protocol.  Results for orders placed or performed during the hospital encounter of 10/16/18  Blood Culture ID Panel (Reflexed) (Collected: 10/16/2018 10:45 AM)  Result Value Ref Range   Enterococcus species NOT DETECTED NOT DETECTED   Listeria monocytogenes NOT DETECTED NOT DETECTED   Staphylococcus species NOT DETECTED NOT DETECTED   Staphylococcus aureus (BCID) NOT DETECTED NOT DETECTED   Streptococcus species NOT DETECTED NOT DETECTED   Streptococcus agalactiae NOT DETECTED NOT DETECTED   Streptococcus pneumoniae NOT DETECTED NOT DETECTED   Streptococcus pyogenes NOT DETECTED NOT DETECTED   Acinetobacter baumannii NOT DETECTED NOT DETECTED   Enterobacteriaceae species DETECTED (A) NOT DETECTED   Enterobacter cloacae complex NOT DETECTED NOT DETECTED   Escherichia coli DETECTED (A) NOT DETECTED   Klebsiella oxytoca NOT DETECTED NOT DETECTED   Klebsiella pneumoniae NOT DETECTED NOT DETECTED   Proteus species NOT DETECTED NOT DETECTED   Serratia marcescens NOT DETECTED NOT DETECTED   Carbapenem resistance NOT DETECTED NOT DETECTED   Haemophilus influenzae NOT DETECTED NOT DETECTED   Neisseria meningitidis NOT DETECTED NOT DETECTED   Pseudomonas aeruginosa NOT DETECTED NOT DETECTED   Candida albicans NOT DETECTED NOT DETECTED   Candida glabrata NOT DETECTED NOT DETECTED   Candida krusei NOT DETECTED NOT DETECTED   Candida parapsilosis NOT  DETECTED NOT DETECTED   Candida tropicalis NOT DETECTED NOT DETECTED    Nani Skillern Crowford 10/17/2018  6:58 AM

## 2018-10-17 NOTE — Progress Notes (Signed)
Echocardiogram 2D Echocardiogram has been performed.  Dana Watkins 10/17/2018, 1:53 PM

## 2018-10-17 NOTE — Plan of Care (Signed)
  Problem: Education: Goal: Knowledge of General Education information will improve Description: Including pain rating scale, medication(s)/side effects and non-pharmacologic comfort measures Outcome: Progressing   Problem: Health Behavior/Discharge Planning: Goal: Ability to manage health-related needs will improve Outcome: Progressing   Problem: Clinical Measurements: Goal: Ability to maintain clinical measurements within normal limits will improve Outcome: Progressing Note: Fever overnight has improved.  Goal: Will remain free from infection Outcome: Progressing Note: On IV abx.  Goal: Diagnostic test results will improve Outcome: Progressing Goal: Respiratory complications will improve Outcome: Progressing Goal: Cardiovascular complication will be avoided Outcome: Progressing   Problem: Activity: Goal: Risk for activity intolerance will decrease Outcome: Progressing   Problem: Safety: Goal: Ability to remain free from injury will improve Outcome: Progressing

## 2018-10-17 NOTE — Progress Notes (Signed)
PROGRESS NOTE    Dana Watkins  KZS:[id number] DOB: November 12, 1955 DOA: 10/16/2018 PCP: Lawerance Cruel, MD  Brief Narrative: 63 year old female with anxiety, splenomegaly, chronic thrombocytopenia, recently diagnosed with cirrhosis on imaging, chronic anemia, chronic kidney disease stage III presented to the emergency room yesterday with fever nausea vomiting myalgias etc. -She was admitted as PUI for COVID, just recently returned to [place] after spending a week in [place]. -COVID PCR on admission was negative, subsequently found to have abnormal urinalysis and gram-negative bacteremia  Assessment & Plan:   Sepsis, GNR bacteremia -I suspect  urinary source, patient reported suprapubic pain and discomfort for several days prior to admission -Blood cultures with gram-negative rods, await identification and sensitivities -Urinalysis is abnormal, follow-up urine cultures -Patient does not have flank tenderness, benign abdominal exam, she has mildly abnormal LFTs however has known diagnosis of cirrhosis and her LFTs are stable from her baseline   Acute kidney injury -In the setting of above illness, poor p.o. intake -Improving with hydration, baseline creatinine is normal, continue gentle IV fluids with potassium today  Liver cirrhosis noted on Imaging -From hospitalization 4 months ago -Follow-up with gastroenterology, denies excessive alcohol use but not very forthcoming with details, no history of hep C -Recommended follow-up with gastroenterology for this  Hypokalemia severe -Has a chronic number cytopenia and supposedly on KCl 40 mEq twice daily at baseline -Patient does not know why she has chronic hypokalemia, has not been worked up by nephrology, not on any contributive meds -Magnesium is also low, given IV mag yesterday, will give another dose today and replace potassium IV n.p.o.  Abnormal LFTs with mildly elevated AST, total bili -This is comparable to recent baseline,  could be secondary to underlying cirrhosis as noted on imaging in the past -Trend, monitor, abdominal exam is benign  Anemia, thrombocytopenia -Predominantly anemia and thrombocytopenia again I suspect this is secondary to splenomegaly, splenic sequestration, check anemia panel -Component of hemodilution likely contributing to acute worsening, no symptoms at this time will monitor    DVT prophylaxis: SCDs  code Status: Full code Family Communication: No family at bedside Disposition Plan: Home pending clinical improvement likely 48 hours  Consultants:    Procedures:   Antimicrobials:    Subjective: -Feels better today, denies any nausea vomiting, denies any abdominal pain, reported several days of suprapubic discomfort  Objective: Vitals:   10/16/18 2200 10/17/18 0100 10/17/18 0330 10/17/18 0700  BP: 118/62 113/62 127/66 132/69  Pulse: 87 78 81 81  Resp: 20 18 20 16   Temp: 99.5 F (37.5 C) 99.5 F (37.5 C) 99.3 F (37.4 C) 99.9 F (37.7 C)  TempSrc: Oral Oral Oral Oral  SpO2: 100% 98% 97% 98%  Weight: 65.8 kg     Height: 5' 8"  (1.727 m)       Intake/Output Summary (Last 24 hours) at 10/17/2018 1134 Last data filed at 10/17/2018 1100 Gross per 24 hour  Intake 3388.14 ml  Output 1900 ml  Net 1488.14 ml   Filed Weights   10/16/18 2200  Weight: 65.8 kg    Examination:  General exam: Thinly built female, laying in bed, AAO x3 Respiratory system: Clear anteriorly Cardiovascular system: S1 & S2 heard,  Gastrointestinal system: Soft, nontender, no right upper quadrant tenderness, bowel sounds present, no CVA tenderness Central nervous system: Alert and oriented. No focal neurological deficits. Extremities: No edema Skin: No rashes, lesions or ulcers Psychiatry: Mood & affect appropriate.     Data Reviewed:   CBC: Recent Labs  Lab 10/16/18 1042 10/17/18 0432  WBC 10.9* 6.4  NEUTROABS 8.6*  --   HGB 8.1* 7.6*  HCT 24.8* 24.0*  MCV 91.9 94.5  PLT 71*  61*   Basic Metabolic Panel: Recent Labs  Lab 10/16/18 1042 10/17/18 0432  NA 133* 139  K 2.2* 2.9*  CL 109 115*  CO2 16* 17*  GLUCOSE 82 100*  BUN 19 15  CREATININE 1.59* 1.33*  CALCIUM 9.0 8.6*  MG  --  2.5*  PHOS  --  1.7*   GFR: Estimated Creatinine Clearance: 44.2 mL/min (A) (by C-G formula based on SCr of 1.33 mg/dL (H)). Liver Function Tests: Recent Labs  Lab 10/16/18 1042 10/17/18 0432  AST 55* 44*  ALT 30 27  ALKPHOS 122 112  BILITOT 2.9* 3.0*  PROT 5.7* 5.2*  ALBUMIN 2.8* 2.5*   No results for input(s): LIPASE, AMYLASE in the last 168 hours. No results for input(s): AMMONIA in the last 168 hours. Coagulation Profile: No results for input(s): INR, PROTIME in the last 168 hours. Cardiac Enzymes: No results for input(s): CKTOTAL, CKMB, CKMBINDEX, TROPONINI in the last 168 hours. BNP (last 3 results) No results for input(s): PROBNP in the last 8760 hours. HbA1C: No results for input(s): HGBA1C in the last 72 hours. CBG: No results for input(s): GLUCAP in the last 168 hours. Lipid Profile: Recent Labs    10/16/18 1042  TRIG 96   Thyroid Function Tests: No results for input(s): TSH, T4TOTAL, FREET4, T3FREE, THYROIDAB in the last 72 hours. Anemia Panel: Recent Labs    10/16/18 1042 10/17/18 0432  FERRITIN 28 28   Urine analysis:    Component Value Date/Time   COLORURINE YELLOW 10/16/2018 [place] 10/16/2018 1425   LABSPEC 1.003 (L) 10/16/2018 1425   PHURINE 6.0 10/16/2018 1425   GLUCOSEU NEGATIVE 10/16/2018 1425   HGBUR MODERATE (A) 10/16/2018 1425   BILIRUBINUR NEGATIVE 10/16/2018 1425   KETONESUR NEGATIVE 10/16/2018 1425   PROTEINUR NEGATIVE 10/16/2018 1425   NITRITE NEGATIVE 10/16/2018 1425   LEUKOCYTESUR LARGE (A) 10/16/2018 1425   Sepsis Labs: @LABRCNTIP (procalcitonin:4,lacticidven:4)  ) Recent Results (from the past 240 hour(s))  Blood Culture (routine x 2)     Status: Abnormal (Preliminary result)   Collection  Time: 10/16/18 10:45 AM   Specimen: BLOOD RIGHT FOREARM  Result Value Ref Range Status   Specimen Description   Final    BLOOD RIGHT FOREARM Performed at [facility], [place] [address] [place], [place] [address]    Special Requests   Final    BOTTLES DRAWN AEROBIC AND ANAEROBIC Blood Culture adequate volume Performed at [place] [address], [place], [place] [address]    Culture  Setup Time   Final    GRAM NEGATIVE RODS CRITICAL RESULT CALLED TO, READ BACK BY AND VERIFIED WITH: PHRMD J GIMSLEY @0259  10/17/18 BY S GEZAHEGN IN BOTH AEROBIC AND ANAEROBIC BOTTLES    Culture (A)  Final    ESCHERICHIA COLI SUSCEPTIBILITIES TO FOLLOW Performed at [facility], [place] [address], [place], [place] [address]    Report Status PENDING  Incomplete  Blood Culture ID Panel (Reflexed)     Status: Abnormal   Collection Time: 10/16/18 10:45 AM  Result Value Ref Range Status   Enterococcus species NOT DETECTED NOT DETECTED Final   Listeria monocytogenes NOT DETECTED NOT DETECTED Final   Staphylococcus species NOT DETECTED NOT DETECTED Final   Staphylococcus aureus (BCID) NOT DETECTED NOT DETECTED Final  Streptococcus species NOT DETECTED NOT DETECTED Final   Streptococcus agalactiae NOT DETECTED NOT DETECTED Final   Streptococcus pneumoniae NOT DETECTED NOT DETECTED Final   Streptococcus pyogenes NOT DETECTED NOT DETECTED Final   Acinetobacter baumannii NOT DETECTED NOT DETECTED Final   Enterobacteriaceae species DETECTED (A) NOT DETECTED Final    Comment: Enterobacteriaceae represent a large family of gram-negative bacteria, not a single organism. CRITICAL RESULT CALLED TO, READ BACK BY AND VERIFIED WITH: PHRMD J GIMSLEY @0259  10/17/18 BY S GEZAHEGN    Enterobacter cloacae complex NOT DETECTED NOT DETECTED Final   Escherichia coli DETECTED (A) NOT DETECTED Final    Comment: CRITICAL RESULT CALLED TO, READ BACK BY AND VERIFIED WITH: PHRMD J  GIMSLEY @0259  10/17/18 BY S GEZAHEGN    Klebsiella oxytoca NOT DETECTED NOT DETECTED Final   Klebsiella pneumoniae NOT DETECTED NOT DETECTED Final   Proteus species NOT DETECTED NOT DETECTED Final   Serratia marcescens NOT DETECTED NOT DETECTED Final   Carbapenem resistance NOT DETECTED NOT DETECTED Final   Haemophilus influenzae NOT DETECTED NOT DETECTED Final   Neisseria meningitidis NOT DETECTED NOT DETECTED Final   Pseudomonas aeruginosa NOT DETECTED NOT DETECTED Final   Candida albicans NOT DETECTED NOT DETECTED Final   Candida glabrata NOT DETECTED NOT DETECTED Final   Candida krusei NOT DETECTED NOT DETECTED Final   Candida parapsilosis NOT DETECTED NOT DETECTED Final   Candida tropicalis NOT DETECTED NOT DETECTED Final    Comment: Performed at [facility], [place] [address], [place], [place] [address]  SARS Coronavirus 2 [facility] order, Performed in [facility] hospital lab) Nasopharyngeal Nasopharyngeal Swab     Status: None   Collection Time: 10/16/18 10:54 AM   Specimen: Nasopharyngeal Swab  Result Value Ref Range Status   SARS Coronavirus 2 NEGATIVE NEGATIVE Final    Comment: (NOTE) If result is NEGATIVE SARS-CoV-2 target nucleic acids are NOT DETECTED. The SARS-CoV-2 RNA is generally detectable in upper and lower  respiratory specimens during the acute phase of infection. The lowest  concentration of SARS-CoV-2 viral copies this assay can detect is 250  copies / mL. A negative result does not preclude SARS-CoV-2 infection  and should not be used as the sole basis for treatment or other  patient management decisions.  A negative result may occur with  improper specimen collection / handling, submission of specimen other  than nasopharyngeal swab, presence of viral mutation(s) within the  areas targeted by this assay, and inadequate number of viral copies  (<250 copies / mL). A negative result must be combined with clinical  observations, patient history, and  epidemiological information. If result is POSITIVE SARS-CoV-2 target nucleic acids are DETECTED. The SARS-CoV-2 RNA is generally detectable in upper and lower  respiratory specimens dur ing the acute phase of infection.  Positive  results are indicative of active infection with SARS-CoV-2.  Clinical  correlation with patient history and other diagnostic information is  necessary to determine patient infection status.  Positive results do  not rule out bacterial infection or co-infection with other viruses. If result is PRESUMPTIVE POSTIVE SARS-CoV-2 nucleic acids MAY BE PRESENT.   A presumptive positive result was obtained on the submitted specimen  and confirmed on repeat testing.  While 2019 novel coronavirus  (SARS-CoV-2) nucleic acids may be present in the submitted sample  additional confirmatory testing may be necessary for epidemiological  and / or clinical management purposes  to differentiate between  SARS-CoV-2 and other Sarbecovirus currently known to infect humans.  If clinically indicated additional testing with an alternate test  methodology [phone]) is advised. The SARS-CoV-2 RNA is generally  detectable in upper and lower respiratory sp ecimens during the acute  phase of infection. The expected result is Negative. Fact Sheet for Patients:  StrictlyIdeas.no Fact Sheet for Healthcare Providers: BankingDealers.co.za This test is not yet approved or cleared by the [place] FDA and has been authorized for detection and/or diagnosis of SARS-CoV-2 by FDA under an Emergency Use Authorization (EUA).  This EUA will remain in effect (meaning this test can be used) for the duration of the COVID-19 declaration under Section 564(b)(1) of the Act, 21 U.S.C. section 360bbb-3(b)(1), unless the authorization is terminated or revoked sooner. Performed at [facility], [place] [address], [place], [place] [address]    Blood Culture (routine x 2)     Status: None (Preliminary result)   Collection Time: 10/16/18 11:45 AM   Specimen: BLOOD LEFT HAND  Result Value Ref Range Status   Specimen Description   Final    BLOOD LEFT HAND Performed at [place] [address], [place], [place] [address]    Special Requests   Final    BOTTLES DRAWN AEROBIC AND ANAEROBIC Blood Culture adequate volume Performed at [place] [address] [place], [place] [address]    Culture  Setup Time   Final    GRAM NEGATIVE RODS CRITICAL VALUE NOTED.  VALUE IS CONSISTENT WITH PREVIOUSLY REPORTED AND CALLED VALUE. IN BOTH AEROBIC AND ANAEROBIC BOTTLES Performed at [facility], [place] [address], [place], [place] [address]    Culture PENDING  Incomplete   Report Status PENDING  Incomplete  Urine culture     Status: Abnormal (Preliminary result)   Collection Time: 10/16/18  2:26 PM   Specimen: Urine, Random  Result Value Ref Range Status   Specimen Description   Final    URINE, RANDOM Performed at [place] [address], [place], [place] [address]    Special Requests   Final    NONE Performed at [facility], [place] [address] [place], [place] [address]    Culture (A)  Final    >=100,000 COLONIES/mL GRAM NEGATIVE RODS IDENTIFICATION AND SUSCEPTIBILITIES TO FOLLOW Performed at [facility], [place] [address], [place], [place] [address]    Report Status PENDING  Incomplete         Radiology Studies: Dg Chest Portable 1 View  Result Date: 10/16/2018 CLINICAL DATA:  Patient arrived via [facility] from home. Pt c/o cough, SOB, N/V, body aches and fever. Pt stated she started "feeling weird" on Thursday and has progressed. Patient stated she took tylenol before EMS arrived. Medical hx of HTN. EXAM: PORTABLE CHEST 1 VIEW COMPARISON:  Chest radiograph 06/09/2018, 06/25/2018 FINDINGS: Stable cardiomediastinal contours with enlarged  heart size. Central venous congestion. No overt edema or new focal consolidation. No pneumothorax or large pleural effusion. Osteopenia. IMPRESSION: Cardiomegaly with central venous congestion. No overt edema or focal consolidation. Electronically Signed   By: Audie Pinto M.D.   On: 10/16/2018 11:31        Scheduled Meds: . folic acid  1 mg Oral Daily  . sodium chloride flush  3 mL Intravenous Q12H  . thiamine  100 mg Oral Daily   Continuous Infusions: . 0.9 % NaCl with KCl 40 mEq / L 75 mL/hr at 10/17/18 1100  . cefTRIAXone (ROCEPHIN)  IV Stopped (10/17/18 0941)  LOS: 1 day    Time spent: 48mn    PDomenic Polite MD [facility]  10/17/2018, 11:34 AM

## 2018-10-17 NOTE — Progress Notes (Signed)
Pharmacy Antibiotic Note  Dana Watkins is a 64 y.o. female admitted on 10/16/2018 with sepsis.  Pharmacy has been consulted for Vancomycin, cefepime dosing.  Plan: Vancomycin 1gm iv x1, then Vancomycin 750 mg IV Q 24hrs. Goal AUC 400-550. Expected AUC: 451 SCr used: 1.59  Cefepime 2gm iv x1, then 2gm iv q12hr   Height: 5' 8"  (172.7 cm) Weight: 145 lb (65.8 kg) IBW/kg (Calculated) : 63.9  Temp (24hrs), Avg:99.7 F (37.6 C), Min:98.4 F (36.9 C), Max:102.4 F (39.1 C)  Recent Labs  Lab 10/16/18 1041 10/16/18 1042 10/16/18 1405  WBC  --  10.9*  --   CREATININE  --  1.59*  --   LATICACIDVEN 3.8*  --  2.2*    Estimated Creatinine Clearance: 37 mL/min (A) (by C-G formula based on SCr of 1.59 mg/dL (H)).    Allergies  Allergen Reactions  . Sulfa Antibiotics Nausea And Vomiting and Rash    Antimicrobials this admission: Vancomycin 10/16/2018 >> Cefepime 10/16/2018 >>   Dose adjustments this admission: -  Microbiology results: -  Thank you for allowing pharmacy to be a part of this patient's care.  Nani Skillern [place] 10/17/2018 4:43 AM

## 2018-10-18 DIAGNOSIS — R6520 Severe sepsis without septic shock: Secondary | ICD-10-CM

## 2018-10-18 DIAGNOSIS — A4151 Sepsis due to Escherichia coli [E. coli]: Principal | ICD-10-CM

## 2018-10-18 DIAGNOSIS — R7881 Bacteremia: Secondary | ICD-10-CM

## 2018-10-18 DIAGNOSIS — B9620 Unspecified Escherichia coli [E. coli] as the cause of diseases classified elsewhere: Secondary | ICD-10-CM

## 2018-10-18 DIAGNOSIS — N179 Acute kidney failure, unspecified: Secondary | ICD-10-CM

## 2018-10-18 LAB — CBC
HCT: 25.3 % — ABNORMAL LOW (ref 36.0–46.0)
Hemoglobin: 7.8 g/dL — ABNORMAL LOW (ref 12.0–15.0)
MCH: 29.4 pg (ref 26.0–34.0)
MCHC: 30.8 g/dL (ref 30.0–36.0)
MCV: 95.5 fL (ref 80.0–100.0)
Platelets: 79 10*3/uL — ABNORMAL LOW (ref 150–400)
RBC: 2.65 MIL/uL — ABNORMAL LOW (ref 3.87–5.11)
RDW: 17.2 % — ABNORMAL HIGH (ref 11.5–15.5)
WBC: 6.8 10*3/uL (ref 4.0–10.5)
nRBC: 0 % (ref 0.0–0.2)

## 2018-10-18 LAB — CULTURE, BLOOD (ROUTINE X 2): Special Requests: ADEQUATE

## 2018-10-18 LAB — ETHYLENE GLYCOL: Ethylene Glycol Lvl: <5 mg/dL

## 2018-10-18 LAB — COMPREHENSIVE METABOLIC PANEL
ALT: 27 U/L (ref 0–44)
AST: 42 U/L — ABNORMAL HIGH (ref 15–41)
Albumin: 2.3 g/dL — ABNORMAL LOW (ref 3.5–5.0)
Alkaline Phosphatase: 161 U/L — ABNORMAL HIGH (ref 38–126)
Anion gap: 13 (ref 5–15)
BUN: 12 mg/dL (ref 8–23)
CO2: 12 mmol/L — ABNORMAL LOW (ref 22–32)
Calcium: 9.3 mg/dL (ref 8.9–10.3)
Chloride: 114 mmol/L — ABNORMAL HIGH (ref 98–111)
Creatinine, Ser: 1.45 mg/dL — ABNORMAL HIGH (ref 0.44–1.00)
GFR calc Af Amer: 45 mL/min — ABNORMAL LOW (ref >60)
GFR calc non Af Amer: 38 mL/min — ABNORMAL LOW (ref >60)
Glucose, Bld: 111 mg/dL — ABNORMAL HIGH (ref 70–99)
Potassium: 3.6 mmol/L (ref 3.5–5.1)
Sodium: 139 mmol/L (ref 135–145)
Total Bilirubin: 1.7 mg/dL — ABNORMAL HIGH (ref 0.3–1.2)
Total Protein: 5.3 g/dL — ABNORMAL LOW (ref 6.5–8.1)

## 2018-10-18 LAB — URINE CULTURE: Culture: >=100000 — AB

## 2018-10-18 LAB — VOLATILES,BLD-ACETONE,ETHANOL,ISOPROP,METHANOL
Acetone, blood: NEGATIVE % (ref 0.000–0.010)
Ethanol, blood: NEGATIVE % (ref 0.000–0.010)
Isopropanol, blood: NEGATIVE % (ref 0.000–0.010)
Methanol, blood: NEGATIVE % (ref 0.000–0.010)

## 2018-10-18 LAB — ACETAMINOPHEN LEVEL: Acetaminophen (Tylenol), Serum: 14 ug/mL (ref 10–30)

## 2018-10-18 LAB — SALICYLATE LEVEL: Salicylate Lvl: <7 mg/dL (ref 2.8–30.0)

## 2018-10-18 MED ORDER — SODIUM CHLORIDE 0.9 % IV SOLN
1.0000 g | Freq: Two times a day (BID) | INTRAVENOUS | Status: DC
  Administered 2018-10-18 – 2018-10-20 (×5): 1 g via INTRAVENOUS
  Filled 2018-10-18 (×6): qty 1

## 2018-10-18 MED ORDER — GUAIFENESIN-DM 100-10 MG/5ML PO SYRP
5.0000 mL | ORAL_SOLUTION | ORAL | Status: DC | PRN
  Administered 2018-10-18 – 2018-10-19 (×3): 5 mL via ORAL
  Filled 2018-10-18 (×3): qty 10

## 2018-10-18 MED ORDER — ALUM & MAG HYDROXIDE-SIMETH 200-200-20 MG/5ML PO SUSP
30.0000 mL | ORAL | Status: DC | PRN
  Administered 2018-10-18 – 2018-10-21 (×3): 30 mL via ORAL
  Filled 2018-10-18 (×3): qty 30

## 2018-10-18 MED ORDER — SODIUM BICARBONATE-DEXTROSE 150-5 MEQ/L-% IV SOLN
150.0000 meq | INTRAVENOUS | Status: AC
  Administered 2018-10-18: 150 meq via INTRAVENOUS
  Filled 2018-10-18 (×3): qty 1000

## 2018-10-18 NOTE — Progress Notes (Signed)
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 RANDOM Performed at [facility] [address], [place], [place] [address]    Special Requests   Final    NONE Performed at [facility], [place] [address] [place], [place] [address]    Culture (A)  Final    >=100,000 COLONIES/mL ESCHERICHIA COLI Confirmed Extended Spectrum Beta-Lactamase Producer (ESBL).  In bloodstream infections from ESBL organisms, carbapenems are preferred over piperacillin/tazobactam. They are shown to have a lower risk of mortality.    Report Status 10/18/2018 FINAL  Final   Organism ID, Bacteria ESCHERICHIA COLI (A)  Final       Susceptibility   Escherichia coli - MIC*    AMPICILLIN >=32 RESISTANT Resistant     CEFAZOLIN >=64 RESISTANT Resistant     CEFTRIAXONE >=64 RESISTANT Resistant     CIPROFLOXACIN 1 SENSITIVE Sensitive     GENTAMICIN >=16 RESISTANT Resistant     IMIPENEM <=0.25 SENSITIVE Sensitive     NITROFURANTOIN <=16 SENSITIVE Sensitive     TRIMETH/SULFA >=320 RESISTANT Resistant     AMPICILLIN/SULBACTAM >=32 RESISTANT Resistant     PIP/TAZO <=4 SENSITIVE Sensitive     Extended ESBL POSITIVE Resistant     * >=100,000 COLONIES/mL ESCHERICHIA COLI    Radiology Studies: No results found.  Scheduled Meds: . folic acid  1 mg Oral Daily  . sodium chloride flush  3 mL Intravenous Q12H  . thiamine  100 mg Oral Daily   Continuous Infusions: . 0.9 % NaCl with KCl 40 mEq / L 75 mL/hr (10/18/18 1227)  . meropenem (MERREM) IV 1 g (10/18/18 0928)    LOS: 2 days   Kerney Elbe, DO [facility] PAGER is on [place]  If 7PM-7AM, please contact night-coverage [email or web address] Password [email or web address] 10/18/2018, 2:40 PM

## 2018-10-18 NOTE — Plan of Care (Signed)
  Problem: Education: Goal: Knowledge of General Education information will improve Description: Including pain rating scale, medication(s)/side effects and non-pharmacologic comfort measures Outcome: Progressing   Problem: Health Behavior/Discharge Planning: Goal: Ability to manage health-related needs will improve Outcome: Progressing   Problem: Clinical Measurements: Goal: Ability to maintain clinical measurements within normal limits will improve Outcome: Progressing Note: Fever controlled.  Goal: Will remain free from infection Outcome: Progressing Note: IV abx.  Goal: Diagnostic test results will improve Outcome: Progressing Goal: Respiratory complications will improve Outcome: Progressing   Problem: Activity: Goal: Risk for activity intolerance will decrease Outcome: Progressing   Problem: Safety: Goal: Ability to remain free from injury will improve Outcome: Progressing

## 2018-10-18 NOTE — Progress Notes (Addendum)
Pharmacy Antibiotic Note  Dana Watkins is a 63 y.o. female admitted on 10/16/2018 with bacteremia.  Pharmacy has been consulted for meropenem dosing. Presented with N/V and fever.  E. Coli noted in Bcx per BCID.  Ucx also with E. Coli and susceptibilities back and noted to be ESBL producing organism.  Patient currently on ceftriaxone and changing to meropenem per isolation of ESBL.   Today, 10/18/2018  CKD - SCr stable  Remains febrile  WBC improved despite ESBL  Plan:  Meropenem 1gm IV q12h per current renal function  Follow renal function, patient with CKD  Pharmacy will follow in background and adjust dose as needed  Height: 5' 8"  (172.7 cm) Weight: 145 lb (65.8 kg) IBW/kg (Calculated) : 63.9  Temp (24hrs), Avg:99.3 F (37.4 C), Min:97.9 F (36.6 C), Max:100.4 F (38 C)  Recent Labs  Lab 10/16/18 1041 10/16/18 1042 10/16/18 1405 10/17/18 0432 10/18/18 0348  WBC  --  10.9*  --  6.4 6.8  CREATININE  --  1.59*  --  1.33* 1.45*  LATICACIDVEN 3.8*  --  2.2*  --   --     Estimated Creatinine Clearance: 40.6 mL/min (A) (by C-G formula based on SCr of 1.45 mg/dL (H)).    Allergies  Allergen Reactions  . Sulfa Antibiotics Nausea And Vomiting and Rash    Antimicrobials this admission: 10/5 vanco x 1 10/5 cefepime >> 10/6 10/6 ceftriaxone >> 10/7 10/7 meropenem >>  Dose adjustments this admission:  Microbiology results: 10/5 BCx: E. Coli (susc pend) 10/5 UCx: E. Coli (ESBL)   Thank you for allowing pharmacy to be a part of this patient's care.  Doreene Eland, PharmD, BCPS.   Work Cell: [phone] 10/18/2018 8:43 AM

## 2018-10-19 ENCOUNTER — Inpatient Hospital Stay (HOSPITAL_COMMUNITY): Payer: Medicare Other

## 2018-10-19 DIAGNOSIS — R197 Diarrhea, unspecified: Secondary | ICD-10-CM

## 2018-10-19 DIAGNOSIS — R0781 Pleurodynia: Secondary | ICD-10-CM

## 2018-10-19 LAB — COMPREHENSIVE METABOLIC PANEL
ALT: 24 U/L (ref 0–44)
AST: 39 U/L (ref 15–41)
Albumin: 2.2 g/dL — ABNORMAL LOW (ref 3.5–5.0)
Alkaline Phosphatase: 158 U/L — ABNORMAL HIGH (ref 38–126)
Anion gap: 7 (ref 5–15)
BUN: 9 mg/dL (ref 8–23)
CO2: 18 mmol/L — ABNORMAL LOW (ref 22–32)
Calcium: 8.5 mg/dL — ABNORMAL LOW (ref 8.9–10.3)
Chloride: 113 mmol/L — ABNORMAL HIGH (ref 98–111)
Creatinine, Ser: 1.22 mg/dL — ABNORMAL HIGH (ref 0.44–1.00)
GFR calc Af Amer: 55 mL/min — ABNORMAL LOW (ref >60)
GFR calc non Af Amer: 47 mL/min — ABNORMAL LOW (ref >60)
Glucose, Bld: 98 mg/dL (ref 70–99)
Potassium: 2.9 mmol/L — ABNORMAL LOW (ref 3.5–5.1)
Sodium: 138 mmol/L (ref 135–145)
Total Bilirubin: 2 mg/dL — ABNORMAL HIGH (ref 0.3–1.2)
Total Protein: 5 g/dL — ABNORMAL LOW (ref 6.5–8.1)

## 2018-10-19 LAB — CBC WITH DIFFERENTIAL/PLATELET
Abs Immature Granulocytes: 0.03 10*3/uL (ref 0.00–0.07)
Basophils Absolute: 0 10*3/uL (ref 0.0–0.1)
Basophils Relative: 0 %
Eosinophils Absolute: 0.2 10*3/uL (ref 0.0–0.5)
Eosinophils Relative: 3 %
HCT: 23.9 % — ABNORMAL LOW (ref 36.0–46.0)
Hemoglobin: 7.6 g/dL — ABNORMAL LOW (ref 12.0–15.0)
Immature Granulocytes: 1 %
Lymphocytes Relative: 15 %
Lymphs Abs: 1 10*3/uL (ref 0.7–4.0)
MCH: 29.6 pg (ref 26.0–34.0)
MCHC: 31.8 g/dL (ref 30.0–36.0)
MCV: 93 fL (ref 80.0–100.0)
Monocytes Absolute: 0.8 10*3/uL (ref 0.1–1.0)
Monocytes Relative: 12 %
Neutro Abs: 4.5 10*3/uL (ref 1.7–7.7)
Neutrophils Relative %: 69 %
Platelets: 78 10*3/uL — ABNORMAL LOW (ref 150–400)
RBC: 2.57 MIL/uL — ABNORMAL LOW (ref 3.87–5.11)
RDW: 17.5 % — ABNORMAL HIGH (ref 11.5–15.5)
WBC: 6.5 10*3/uL (ref 4.0–10.5)
nRBC: 0 % (ref 0.0–0.2)

## 2018-10-19 LAB — HEAVY METALS, BLOOD
Arsenic: 10 ug/L (ref 2–23)
Lead: 1 ug/dL (ref 0–4)
Mercury: 1.5 ug/L (ref 0.0–14.9)

## 2018-10-19 LAB — CULTURE, BLOOD (ROUTINE X 2): Special Requests: ADEQUATE

## 2018-10-19 LAB — PROCALCITONIN: Procalcitonin: 2.03 ng/mL

## 2018-10-19 LAB — PHOSPHORUS: Phosphorus: 1.5 mg/dL — ABNORMAL LOW (ref 2.5–4.6)

## 2018-10-19 LAB — HEPATITIS PANEL, ACUTE
HCV Ab: NONREACTIVE
Hep A IgM: NONREACTIVE
Hep B C IgM: NONREACTIVE
Hepatitis B Surface Ag: NONREACTIVE

## 2018-10-19 LAB — LACTIC ACID, PLASMA: Lactic Acid, Venous: 1.4 mmol/L (ref 0.5–1.9)

## 2018-10-19 LAB — MAGNESIUM: Magnesium: 2 mg/dL (ref 1.7–2.4)

## 2018-10-19 LAB — D-DIMER, QUANTITATIVE: D-Dimer, Quant: 2.04 ug/mL-FEU — ABNORMAL HIGH (ref 0.00–0.50)

## 2018-10-19 MED ORDER — LOPERAMIDE HCL 2 MG PO CAPS
2.0000 mg | ORAL_CAPSULE | ORAL | Status: DC | PRN
  Administered 2018-10-19 – 2018-10-23 (×5): 2 mg via ORAL
  Filled 2018-10-19 (×6): qty 1

## 2018-10-19 MED ORDER — POTASSIUM CHLORIDE CRYS ER 20 MEQ PO TBCR
40.0000 meq | EXTENDED_RELEASE_TABLET | Freq: Two times a day (BID) | ORAL | Status: AC
  Administered 2018-10-19 (×2): 40 meq via ORAL
  Filled 2018-10-19 (×2): qty 2

## 2018-10-19 MED ORDER — DICLOFENAC SODIUM 1 % TD GEL
2.0000 g | Freq: Four times a day (QID) | TRANSDERMAL | Status: DC
  Administered 2018-10-19 – 2018-10-22 (×8): 2 g via TOPICAL
  Filled 2018-10-19: qty 100

## 2018-10-19 MED ORDER — POTASSIUM PHOSPHATES 15 MMOLE/5ML IV SOLN
20.0000 mmol | Freq: Once | INTRAVENOUS | Status: AC
  Administered 2018-10-19: 20 mmol via INTRAVENOUS
  Filled 2018-10-19: qty 6.67

## 2018-10-19 NOTE — Progress Notes (Signed)
PROGRESS NOTE    Dana Watkins  BLT:[id number] DOB: 1955/10/05 DOA: 10/16/2018 PCP: Lawerance Cruel, MD   Brief Narrative:  HPI per Dr. Murray Hodgkins on 10/16/2018 63 year old woman PMH chronic thrombocytopenia, anemia, CKD stage III PRESENTED to the emergency department 10/5 for fever, cough, nausea, retching, myalgias.  Admitted for febrile illness, suspected false-negative COVID, multiple lab abnormalities.  Patient reports her symptoms began 10/1 with fever, cough, nausea with refractory dry heaves, shortness of breath, no specific aggravating or alleviating factors.  He has not been able to eat or keep anything on her stomach.  Even water comes back up.  Symptoms continue to worsen so she came to the emergency department.  Patient returned October 1 from spending a week in [place].  She flew by private check.  No history of blood clots.  **Interim History Patient was found to have a ESBL E. coli and likely associated bacteremia and her antibiotics were escalated to IV meropenem. Obtained a right upper quadrant ultrasound and evaluate her for liver cirrhosis and showed a single mobile gallstone in the gallbladder with gallbladder wall thickening which was rightly related to her hepatic dysfunction ascites and possible mild nodular hepatic contour which likely represents cirrhosis but no suspicious focal hepatic lesions and large portal vein with normal hepato-pedal portal vein flow.  Lab values are improving slowly.  Patient's mother concerned with husband is "poisoning her".  So we will check further work-up laboratory studies and they have been negative. Patient's Hospitalization has now been complicated by suspected antibiotic induced diarrhea.  Assessment & Plan:   Principal Problem:   SIRS (systemic inflammatory response syndrome) (HCC) Active Problems:   Thrombocytopenia (HCC)   Hypokalemia   CKD (chronic kidney disease), stage III   Acute lower UTI   Elevated troponin  Elevated d-dimer  Sepsis from an ESBL E. coli bacteremia and UTI, improving -In the setting of urinary source, patient reported suprapubic pain and discomfort for several days prior to admission -Urinalysis on admission showed moderate hemoglobin on the urine dipstick, large leukocytes, negative nitrites, many bacteria, 0-5 squamous epithelial cells, and 21-50 WBCs -Blood cultures with gram-negative rods and shows colony-forming units of ESBL E. coli which is sensitive to ciprofloxacin, imipenem, Zosyn, and Macrobid -SARS-CoV-2 testing was negative -Blood cultures x2 also show species with E. coli with sensitivities showing sensitivities to ciprofloxacin, imipenem, and Zosyn -Patient does not have flank tenderness, benign abdominal exam, she has mildly abnormal LFTs however has known diagnosis of cirrhosis and her LFTs are stable from her baseline -IV fluid hydration changed as below -Antibiotics escalated from IV ceftriaxone to IV meropenem for now we will continue IV meropenem for now -Lactic acid level on admission was 2.2 and will repeat repeat this morning was one-point -Patient's WBC went from 10.9 is now 6.5 -Procalcitonin level went from 13.13 is now 2.03 -Continue monitor temperature curve as well as repeat CBC in a.m. -We will treat for 7 days with IV meropenem at least -Sepsis physiology is improving   Acute Kidney Injury on CKD Stage 3? -In the setting of above illness, poor p.o. intake -Improving with hydration, baseline creatinine is around 1.0-1.25 and recently has been ranging from 1.3 upwards and yesterday was 1.45; Today BUN/Cr is now 9/1.22 -Avoid nephrotoxic medications, contrast dyes, as well as hypotension possible -IV fluid hydration as below -Procalcitonin level on admission was 13.13 -> 2.03 -Continue to monitor and trend renal function -Repeat CMP in a.m.  Early Liver Cirrhosis noted on Imaging in  the setting of fatty liver disease -From hospitalization 4 months  ago -Follow-up with gastroenterology, denies excessive alcohol use but not very forthcoming with details, no history of hep C -Checked acute hepatitis panel and was Non-Reactive  -Continue with folic acid and vitamin B1 supplementation -Recommended follow-up with gastroenterology for this as an outpatient but the interim we will obtain a right upper quadrant ultrasound -RUQ U/S showed "Single mobile gallstone with gallbladder wall thickening. The extent of this gallbladder wall thickening is more likely related to hepatic dysfunction/ascites. If there is strong clinical suspicion for acute cholecystitis, consider nuclear medicine study. Possible mild nodular hepatic contour which may represent cirrhosis. No suspicious focal hepatic lesions. Enlarged portal vein with normal hepatopetal portal vein flow." -Continue to monitor carefully as patient is getting IV fluid hydration -Right upper quadrant ultrasound showed only a trace amount of perihepatic ascites  Hypokalemia  -Has a chronic hypokalemia and supposedly on KCl 40 mEq twice daily at baseline likely has worsened in the setting of diarrhea and serum bicarbonate infusion -Patient does not know why she has chronic hypokalemia, has not been worked up by nephrology, not on any contributive meds -Magnesium is also low, given IV mag yesterday, will give another dose today and replace potassium IV -K+ is now 2.9 and magnesium level is 2.0 -Replete with p.o. KCl protocol as twice daily x2 doses as well as IV K-Phos 20 mmol -Continue to monitor and replete as necessary -Repeat CBC in a.m.  Hypophosphatemia -Patient's Phos Level this AM was 1.5 -Replete with IV KPhos 20 mmol -Continue to Monitor and Replete as Necessary -Repeat Phos Level in the AM   Abnormal LFTs with mildly elevated AST, Hyperbilirubinemia -This is comparable to recent baseline, could be secondary to underlying cirrhosis as noted on imaging in the past -Continue to monitor  and trend hepatic function panel next -Abdominal examination appears benign and T bili is trending down -AST is trending down and is now improved and 39 -T bili went from 3.0 -> 1.7 -> 2.0 -Continue to monitor and trend -RUQ U/S done and showed likely Cirrhosis  -Repeat CMP in the a.m.  Normocytic Anemia Thrombocytopenia -Suspect this is secondary to splenomegaly, splenic sequestration -Checking anemia panel -Component of hemodilution likely contributing to acute worsening, no symptoms at this time will monitor -Continue to monitor for signs and symptoms of bleeding; currently no overt bleeding noted -Patient hemoglobin/hematocrit is now 7.6/23.9 -Platelet count went from 71,000 on admission is now 78,000 -Repeat CBC in AM   Metabolic Acidosis, improving  -Unclear etiology at this point but did have a Lactic Acidosis on admission at 2.3 -CO2 was 12, chloride level 114, and anion gap was 13; Now it is improving a CO2 is 18, anion gap is 7, and chloride is 113 -Changed IV fluids from normal saline with 40 mucosal KCl to sodium bicarbonate 150 mEq and will continue today -Check for etiologies of acidosis including blood work and could not find any  -Repeat CMP in a.m.  Suspected Poisoning? -Patient's mother adamantly states that the patient's ex-husband is trying to poison the patient and states that she got sick on the night that she saw her husband -We will notify social work for further investigation -In the interim we will check an arsenic level, volatile blood level, salicylate level, as well as ethylene glycol level given the patient still has an acidosis -Patient's arsenic level was 10, ethylene glycol levels less than 5, lead levels 1, mercury level 12.5, acetone level in  the blood was negative, ethanol and blood were negative, isopropyl and blood was negative, methanol and blood was negative, and salicylate level was less than 7.0 -We will have Social work look into the issue  further   Hyponatremia -Improved with IV fluid hydration -Will continue monitor and trend -Repeat CMP in AM   Cardiomegaly and vascular congestion -Continue to monitor carefully as patient is getting IV fluid hydration -Repeat chest x-ray in a.m. -BNP was elevated at 357.7 -Echocardiogram as below -She is +3.439 Liters since Admission  -Continue to Monitor for S/Sx of Volume Overload   Elevated troponin, flat -No chest pain.  EKG nonacute.  Significance unclear.  Suspect demand ischemia secondary to acute illness. -ECHOCardiogram as below  Elevated D-Dimer -Significance unclear but likely in the setting of Infection.   -She has traveled recently, but it was a short flight on a private jet.  Has no leg edema, no hypoxia, no hemodynamic instability.  VTE does not seem likely to explain her presentation.  Given her thrombocytopenia and low suspicion for PE, will not anticoagulate at this point.   -C/w Bedrest with bathroom privileges. -D-Dimer is trending down and went from 1.93 -> 1.27 -> 2.04; Will not pursue further workup or CTA at this time given lack of Renal Fxn improvement  -Repeat D-Dimer in AM  -Bilateral lower extremity venous Dopplers ordered on admission but canceled as she has no edema, signs or symptoms of DVT  Several indeterminate left hepatic lobe lesions largest of which 3.4 cm seen on ultrasound 06/2014 -Consider outpatient MRI liver protocol -Checked RUQ U/S this visit and showed "Possible mild nodular hepatic contour which may represent cirrhosis. No suspicious focal hepatic lesions. Enlarged portal vein with normal hepatopetal portal vein flow."  Left Rib Pain -Check a Left Rib X-Ray -Started Diclofenac 2 grams QID for Pain  Diarrhea -Check GI Pathogen Panel -Do not clinically feel as if this is C Difficile as she is afebrile, has no Leukocytosis and does not have any crampy abdominal pain -Likely Abx Induced from Meropenem -Trial of Loperamide -Continue  to Monitor Carefully   DVT prophylaxis: SCDs given Thrombocytopenia and Anemia  Code Status: FULL CODE  Family Communication: Discussed with Mother in the Hallway  Disposition Plan: Pending further workup and improvement  Consultants:   None   Procedures:  ECHOCARDIOGRAM IMPRESSIONS    1. Left ventricular ejection fraction, by visual estimation, is 55 to 60%. The left ventricle has normal function. Normal left ventricular size. There is no left ventricular hypertrophy.  2. Global right ventricle has normal systolic function.The right ventricular size is normal. No increase in right ventricular wall thickness.  3. Left atrial size was severely dilated.  4. Right atrial size was normal.  5. Moderate to severe mitral annular calcification.  6. The mitral valve is normal in structure. There is eccentric mitral regurgitation, probably moderate, posteriorly directed. It may be underestimated.  7. The tricuspid valve is normal in structure. Tricuspid valve regurgitation is mild.  8. The aortic valve is normal in structure. Aortic valve regurgitation was not visualized by color flow Doppler.  9. The pulmonic valve was normal in structure. Pulmonic valve regurgitation is not visualized by color flow Doppler. 10. Moderately elevated pulmonary artery systolic pressure. 11. The inferior vena cava is dilated in size with <50% respiratory variability, suggesting right atrial pressure of 15 mmHg. 12. Consider TEE if the clinical exam suggests severe mitral insufficiency.  FINDINGS  Left Ventricle: Left ventricular ejection fraction, by visual estimation,  is 55 to 60%. The left ventricle has normal function. No evidence of left ventricular regional wall motion abnormalities. There is no left ventricular hypertrophy. Normal left  ventricular size. Spectral Doppler shows Left ventricular diastolic parameters were normal pattern of LV diastolic filling. Normal left ventricular filling  pressures.  Right Ventricle: The right ventricular size is normal. No increase in right ventricular wall thickness. Global RV systolic function is has normal systolic function. The tricuspid regurgitant velocity is 2.99 m/s, and with an assumed right atrial pressure  of 15 mmHg, the estimated right ventricular systolic pressure is moderately elevated at 50.8 mmHg.  Left Atrium: Left atrial size was severely dilated.  Right Atrium: Right atrial size was normal in size  Pericardium: There is no evidence of pericardial effusion.  Mitral Valve: The mitral valve is normal in structure. Moderate to severe mitral annular calcification. Mild mitral valve regurgitation, with eccentric laterally directed jet.  Tricuspid Valve: The tricuspid valve is normal in structure. Tricuspid valve regurgitation is mild by color flow Doppler.  Aortic Valve: The aortic valve is normal in structure. Aortic valve regurgitation was not visualized by color flow Doppler.  Pulmonic Valve: The pulmonic valve was normal in structure. Pulmonic valve regurgitation is not visualized by color flow Doppler.  Aorta: The aortic root and ascending aorta are structurally normal, with no evidence of dilitation.  Venous: The inferior vena cava is dilated in size with less than 50% respiratory variability, suggesting right atrial pressure of 15 mmHg.  IAS/Shunts: No atrial level shunt detected by color flow Doppler.     LEFT VENTRICLE PLAX 2D LVIDd:         5.40 cm  Diastology LVIDs:         3.00 cm  LV e' lateral:   10.60 cm/s LV PW:         1.20 cm  LV E/e' lateral: 9.6 LV IVS:        1.10 cm  LV e' medial:    10.40 cm/s LVOT diam:     2.10 cm  LV E/e' medial:  9.8 LV SV:         106 ml LV SV Index:   59.84 LVOT Area:     3.46 cm    RIGHT VENTRICLE RV S prime:     18.50 cm/s TAPSE (M-mode): 3.3 cm  LEFT ATRIUM              Index LA diam:        5.00 cm  2.80 cm/m LA Vol (A2C):   78.9 ml  44.26  ml/m LA Vol (A4C):   117.0 ml 65.63 ml/m LA Biplane Vol: 101.0 ml 56.66 ml/m  AORTIC VALVE LVOT Vmax:   106.00 cm/s LVOT Vmean:  69.500 cm/s LVOT VTI:    0.174 m   AORTA Ao Root diam: 2.90 cm Ao Asc diam:  3.00 cm  MITRAL VALVE                         TRICUSPID VALVE MV Area (PHT): 4.36 cm              TR Peak grad:   35.8 mmHg MV PHT:        50.46 msec            TR Vmax:        299.00 cm/s MV Decel Time: 174 msec MV E velocity: 102.00 cm/s 103 cm/s  SHUNTS MV A velocity: 44.60  cm/s  70.3 cm/s Systemic VTI:  0.17 m MV E/A ratio:  2.29        1.5       Systemic Diam: 2.10 cm   Antimicrobials:  Anti-infectives (From admission, onward)   Start     Dose/Rate Route Frequency Ordered Stop   10/18/18 1000  meropenem (MERREM) 1 g in sodium chloride 0.9 % 100 mL IVPB     1 g 200 mL/hr over 30 Minutes Intravenous Every 12 hours 10/18/18 0836     10/17/18 1000  vancomycin (VANCOCIN) IVPB 750 mg/150 ml premix  Status:  Discontinued     750 mg 150 mL/hr over 60 Minutes Intravenous Every 24 hours 10/17/18 0443 10/17/18 0654   10/17/18 1000  cefTRIAXone (ROCEPHIN) 2 g in sodium chloride 0.9 % 100 mL IVPB  Status:  Discontinued     2 g 200 mL/hr over 30 Minutes Intravenous Every 24 hours 10/17/18 0655 10/18/18 0836   10/17/18 0600  ceFEPIme (MAXIPIME) 2 g in sodium chloride 0.9 % 100 mL IVPB  Status:  Discontinued     2 g 200 mL/hr over 30 Minutes Intravenous Every 12 hours 10/17/18 0443 10/17/18 0654   10/16/18 2000  ceFEPIme (MAXIPIME) 2 g in sodium chloride 0.9 % 100 mL IVPB  Status:  Discontinued     2 g 200 mL/hr over 30 Minutes Intravenous  Once 10/16/18 1956 10/17/18 0031   10/16/18 2000  vancomycin (VANCOCIN) IVPB 1000 mg/200 mL premix  Status:  Discontinued     1,000 mg 200 mL/hr over 60 Minutes Intravenous  Once 10/16/18 1956 10/17/18 0101   10/16/18 1230  ceFEPIme (MAXIPIME) 2 g in sodium chloride 0.9 % 100 mL IVPB     2 g 200 mL/hr over 30 Minutes Intravenous  Once  10/16/18 1218 10/16/18 1429   10/16/18 1230  vancomycin (VANCOCIN) IVPB 1000 mg/200 mL premix     1,000 mg 200 mL/hr over 60 Minutes Intravenous  Once 10/16/18 1218 10/16/18 1900     Subjective: Seen and examined at bedside and she states that she is doing much better.  Now complaining of some diarrhea and some left-sided rib pain.  No nausea or vomiting.  No other concerns otherwise at this time and feels like she is improving  Objective: Vitals:   10/18/18 1308 10/18/18 2206 10/19/18 0458 10/19/18 1254  BP: 130/74 122/68 123/74 129/72  Pulse: 91 78 73 79  Resp: 20 18 18    Temp: 98.9 F (37.2 C) 98.2 F (36.8 C) 98.3 F (36.8 C) 98.7 F (37.1 C)  TempSrc: Oral  Oral Oral  SpO2: 100% 98% 100% 100%  Weight:      Height:        Intake/Output Summary (Last 24 hours) at 10/19/2018 1329 Last data filed at 10/19/2018 1000 Gross per 24 hour  Intake 1260.85 ml  Output 802 ml  Net 458.85 ml   Filed Weights   10/16/18 2200  Weight: 65.8 kg   Examination: Physical Exam:  Constitutional: WN/WD Caucasian female in NAD and appears calm  Eyes: Lids and conjunctivae normal, sclerae anicteric  ENMT: External Ears, Nose appear normal. Grossly normal hearing. Mucous membranes are moist. Neck: Appears normal, supple, no cervical masses, normal ROM, no appreciable thyromegaly; no JVD Respiratory: Diminished to auscultation bilaterally, no wheezing, rales, rhonchi or crackles. Normal respiratory effort and patient is not tachypenic. No accessory muscle use.  Cardiovascular: RRR, no murmurs / rubs / gallops. S1 and S2 auscultated. Trace extremity edema.  Abdomen: Soft, non-tender to palpate, Distended. No masses palpated. No appreciable hepatosplenomegaly. Bowel sounds positive.  GU: Deferred. Musculoskeletal: No clubbing / cyanosis of digits/nails. No joint deformity upper and lower extremities. Has painful palpation of left sided ribs Skin: No rashes, lesions, ulcers on a limited skin  evaluation. No induration; Warm and dry.  Neurologic: CN 2-12 grossly intact with no focal deficits. Romberg sign and cerebellar reflexes not assessed.  Psychiatric: Normal judgment and insight. Alert and oriented x 3. Pleasant mood and appropriate affect.   Data Reviewed: I have personally reviewed following labs and imaging studies  CBC: Recent Labs  Lab 10/16/18 1042 10/17/18 0432 10/18/18 0348 10/19/18 0523  WBC 10.9* 6.4 6.8 6.5  NEUTROABS 8.6*  --   --  4.5  HGB 8.1* 7.6* 7.8* 7.6*  HCT 24.8* 24.0* 25.3* 23.9*  MCV 91.9 94.5 95.5 93.0  PLT 71* 61* 79* 78*   Basic Metabolic Panel: Recent Labs  Lab 10/16/18 1042 10/17/18 0432 10/18/18 0348 10/19/18 0523  NA 133* 139 139 138  K 2.2* 2.9* 3.6 2.9*  CL 109 115* 114* 113*  CO2 16* 17* 12* 18*  GLUCOSE 82 100* 111* 98  BUN 19 15 12 9   CREATININE 1.59* 1.33* 1.45* 1.22*  CALCIUM 9.0 8.6* 9.3 8.5*  MG  --  2.5*  --  2.0  PHOS  --  1.7*  --  1.5*   GFR: Estimated Creatinine Clearance: 48.2 mL/min (A) (by C-G formula based on SCr of 1.22 mg/dL (H)). Liver Function Tests: Recent Labs  Lab 10/16/18 1042 10/17/18 0432 10/18/18 0348 10/19/18 0523  AST 55* 44* 42* 39  ALT 30 27 27 24   ALKPHOS 122 112 161* 158*  BILITOT 2.9* 3.0* 1.7* 2.0*  PROT 5.7* 5.2* 5.3* 5.0*  ALBUMIN 2.8* 2.5* 2.3* 2.2*   No results for input(s): LIPASE, AMYLASE in the last 168 hours. No results for input(s): AMMONIA in the last 168 hours. Coagulation Profile: No results for input(s): INR, PROTIME in the last 168 hours. Cardiac Enzymes: No results for input(s): CKTOTAL, CKMB, CKMBINDEX, TROPONINI in the last 168 hours. BNP (last 3 results) No results for input(s): PROBNP in the last 8760 hours. HbA1C: No results for input(s): HGBA1C in the last 72 hours. CBG: No results for input(s): GLUCAP in the last 168 hours. Lipid Profile: No results for input(s): CHOL, HDL, LDLCALC, TRIG, CHOLHDL, LDLDIRECT in the last 72 hours. Thyroid Function  Tests: No results for input(s): TSH, T4TOTAL, FREET4, T3FREE, THYROIDAB in the last 72 hours. Anemia Panel: Recent Labs    10/17/18 0432  FERRITIN 28   Sepsis Labs: Recent Labs  Lab 10/16/18 1041 10/16/18 1042 10/16/18 1405 10/19/18 0523  PROCALCITON  --  13.13  --  2.03  LATICACIDVEN 3.8*  --  2.2* 1.4    Recent Results (from the past 240 hour(s))  Blood Culture (routine x 2)     Status: Abnormal   Collection Time: 10/16/18 10:45 AM   Specimen: BLOOD RIGHT FOREARM  Result Value Ref Range Status   Specimen Description   Final    BLOOD RIGHT FOREARM Performed at [facility] [address], [place], [place] [address]    Special Requests   Final    BOTTLES DRAWN AEROBIC AND ANAEROBIC Blood Culture adequate volume Performed at [facility] [address] [place], [place] [address]    Culture  Setup Time   Final    GRAM NEGATIVE RODS CRITICAL RESULT CALLED TO, READ BACK  BY AND VERIFIED WITH: PHRMD J GIMSLEY @0259  10/17/18 BY S GEZAHEGN IN BOTH AEROBIC AND ANAEROBIC BOTTLES Performed at [facility], [place] [address], [place], [place] [address]    Culture (A)  Final    ESCHERICHIA COLI Confirmed Extended Spectrum Beta-Lactamase Producer (ESBL).  In bloodstream infections from ESBL organisms, carbapenems are preferred over piperacillin/tazobactam. They are shown to have a lower risk of mortality.    Report Status 10/18/2018 FINAL  Final   Organism ID, Bacteria ESCHERICHIA COLI  Final      Susceptibility   Escherichia coli - MIC*    AMPICILLIN >=32 RESISTANT Resistant     CEFAZOLIN >=64 RESISTANT Resistant     CEFEPIME >=64 RESISTANT Resistant     CEFTAZIDIME RESISTANT Resistant     CEFTRIAXONE >=64 RESISTANT Resistant     CIPROFLOXACIN 1 SENSITIVE Sensitive     GENTAMICIN >=16 RESISTANT Resistant     IMIPENEM <=0.25 SENSITIVE Sensitive     TRIMETH/SULFA >=320 RESISTANT Resistant     AMPICILLIN/SULBACTAM >=32 RESISTANT  Resistant     PIP/TAZO <=4 SENSITIVE Sensitive     Extended ESBL POSITIVE Resistant     * ESCHERICHIA COLI  Blood Culture ID Panel (Reflexed)     Status: Abnormal   Collection Time: 10/16/18 10:45 AM  Result Value Ref Range Status   Enterococcus species NOT DETECTED NOT DETECTED Final   Listeria monocytogenes NOT DETECTED NOT DETECTED Final   Staphylococcus species NOT DETECTED NOT DETECTED Final   Staphylococcus aureus (BCID) NOT DETECTED NOT DETECTED Final   Streptococcus species NOT DETECTED NOT DETECTED Final   Streptococcus agalactiae NOT DETECTED NOT DETECTED Final   Streptococcus pneumoniae NOT DETECTED NOT DETECTED Final   Streptococcus pyogenes NOT DETECTED NOT DETECTED Final   Acinetobacter baumannii NOT DETECTED NOT DETECTED Final   Enterobacteriaceae species DETECTED (A) NOT DETECTED Final    Comment: Enterobacteriaceae represent a large family of gram-negative bacteria, not a single organism. CRITICAL RESULT CALLED TO, READ BACK BY AND VERIFIED WITH: PHRMD J GIMSLEY @0259  10/17/18 BY S GEZAHEGN    Enterobacter cloacae complex NOT DETECTED NOT DETECTED Final   Escherichia coli DETECTED (A) NOT DETECTED Final    Comment: CRITICAL RESULT CALLED TO, READ BACK BY AND VERIFIED WITH: PHRMD J GIMSLEY @0259  10/17/18 BY S GEZAHEGN    Klebsiella oxytoca NOT DETECTED NOT DETECTED Final   Klebsiella pneumoniae NOT DETECTED NOT DETECTED Final   Proteus species NOT DETECTED NOT DETECTED Final   Serratia marcescens NOT DETECTED NOT DETECTED Final   Carbapenem resistance NOT DETECTED NOT DETECTED Final   Haemophilus influenzae NOT DETECTED NOT DETECTED Final   Neisseria meningitidis NOT DETECTED NOT DETECTED Final   Pseudomonas aeruginosa NOT DETECTED NOT DETECTED Final   Candida albicans NOT DETECTED NOT DETECTED Final   Candida glabrata NOT DETECTED NOT DETECTED Final   Candida krusei NOT DETECTED NOT DETECTED Final   Candida parapsilosis NOT DETECTED NOT DETECTED Final   Candida  tropicalis NOT DETECTED NOT DETECTED Final    Comment: Performed at [facility], [place] [address] [place], [place] [address]  SARS Coronavirus [facility] order, Performed in [facility] hospital lab) Nasopharyngeal Nasopharyngeal Swab     Status: None   Collection Time: 10/16/18 10:54 AM   Specimen: Nasopharyngeal Swab  Result Value Ref Range Status   SARS Coronavirus 2 NEGATIVE NEGATIVE Final    Comment: (NOTE) If result is NEGATIVE SARS-CoV-2 target nucleic acids are NOT DETECTED. The SARS-CoV-2 RNA is generally detectable  in upper and lower  respiratory specimens during the acute phase of infection. The lowest  concentration of SARS-CoV-2 viral copies this assay can detect is 250  copies / mL. A negative result does not preclude SARS-CoV-2 infection  and should not be used as the sole basis for treatment or other  patient management decisions.  A negative result may occur with  improper specimen collection / handling, submission of specimen other  than nasopharyngeal swab, presence of viral mutation(s) within the  areas targeted by this assay, and inadequate number of viral copies  (<250 copies / mL). A negative result must be combined with clinical  observations, patient history, and epidemiological information. If result is POSITIVE SARS-CoV-2 target nucleic acids are DETECTED. The SARS-CoV-2 RNA is generally detectable in upper and lower  respiratory specimens dur ing the acute phase of infection.  Positive  results are indicative of active infection with SARS-CoV-2.  Clinical  correlation with patient history and other diagnostic information is  necessary to determine patient infection status.  Positive results do  not rule out bacterial infection or co-infection with other viruses. If result is PRESUMPTIVE POSTIVE SARS-CoV-2 nucleic acids MAY BE PRESENT.   A presumptive positive result was obtained on the submitted specimen  and confirmed on repeat testing.  While 2019  novel coronavirus  (SARS-CoV-2) nucleic acids may be present in the submitted sample  additional confirmatory testing may be necessary for epidemiological  and / or clinical management purposes  to differentiate between  SARS-CoV-2 and other Sarbecovirus currently known to infect humans.  If clinically indicated additional testing with an alternate test  methodology [phone]) is advised. The SARS-CoV-2 RNA is generally  detectable in upper and lower respiratory sp ecimens during the acute  phase of infection. The expected result is Negative. Fact Sheet for Patients:  StrictlyIdeas.no Fact Sheet for Healthcare Providers: BankingDealers.co.za This test is not yet approved or cleared by the [place] FDA and has been authorized for detection and/or diagnosis of SARS-CoV-2 by FDA under an Emergency Use Authorization (EUA).  This EUA will remain in effect (meaning this test can be used) for the duration of the COVID-19 declaration under Section 564(b)(1) of the Act, 21 U.S.C. section 360bbb-3(b)(1), unless the authorization is terminated or revoked sooner. Performed at [facility], [place] [address], [place], [place] [address]   Blood Culture (routine x 2)     Status: Abnormal   Collection Time: 10/16/18 11:45 AM   Specimen: BLOOD LEFT HAND  Result Value Ref Range Status   Specimen Description   Final    BLOOD LEFT HAND Performed at [facility] [address], [place], [place] [address]    Special Requests   Final    BOTTLES DRAWN AEROBIC AND ANAEROBIC Blood Culture adequate volume Performed at [facility] [address] [place], [place] [address]    Culture  Setup Time   Final    GRAM NEGATIVE RODS CRITICAL VALUE NOTED.  VALUE IS CONSISTENT WITH PREVIOUSLY REPORTED AND CALLED VALUE. IN BOTH AEROBIC AND ANAEROBIC BOTTLES    Culture (A)  Final    ESCHERICHIA  COLI SUSCEPTIBILITIES PERFORMED ON PREVIOUS CULTURE WITHIN THE LAST 5 DAYS. Performed at [facility], [place] [address], [place],  [address]    Report Status 10/19/2018 FINAL  Final  Urine culture     Status: Abnormal   Collection Time: 10/16/18  2:26 PM   Specimen: Urine, Random  Result Value Ref Range  Status   Specimen Description   Final    URINE, RANDOM Performed at [facility] [address], [place], [place] [address]    Special Requests   Final    NONE Performed at [facility], [place] [address] [place], [place] [address]    Culture (A)  Final    >=100,000 COLONIES/mL ESCHERICHIA COLI Confirmed Extended Spectrum Beta-Lactamase Producer (ESBL).  In bloodstream infections from ESBL organisms, carbapenems are preferred over piperacillin/tazobactam. They are shown to have a lower risk of mortality.    Report Status 10/18/2018 FINAL  Final   Organism ID, Bacteria ESCHERICHIA COLI (A)  Final      Susceptibility   Escherichia coli - MIC*    AMPICILLIN >=32 RESISTANT Resistant     CEFAZOLIN >=64 RESISTANT Resistant     CEFTRIAXONE >=64 RESISTANT Resistant     CIPROFLOXACIN 1 SENSITIVE Sensitive     GENTAMICIN >=16 RESISTANT Resistant     IMIPENEM <=0.25 SENSITIVE Sensitive     NITROFURANTOIN <=16 SENSITIVE Sensitive     TRIMETH/SULFA >=320 RESISTANT Resistant     AMPICILLIN/SULBACTAM >=32 RESISTANT Resistant     PIP/TAZO <=4 SENSITIVE Sensitive     Extended ESBL POSITIVE Resistant     * >=100,000 COLONIES/mL ESCHERICHIA COLI    Radiology Studies: US Abdomen Limited Ruq  Result Date: 10/19/2018 CLINICAL DATA:  63 year old female with abnormal LFTs. EXAM: ULTRASOUND ABDOMEN LIMITED RIGHT UPPER QUADRANT COMPARISON:  None. FINDINGS: Gallbladder: Gallbladder wall thickening/edema noted measuring up to 11 mm. A single mobile 6 mm gallstone is noted. No sonographic Murphy sign noted. Common bile duct: Diameter: 2 mm.  No intrahepatic  or extrahepatic biliary dilatation. Liver: Possible minimal hepatic contour nodularity noted which may represent cirrhosis. No focal hepatic masses are present. A 1.6 cm LEFT hepatic cyst is present. The portal vein is enlarged measuring up to 1.9 cm in greatest diameter. Portal vein is patent on color Doppler imaging with normal direction of blood flow towards the liver. Other: A trace amount of perihepatic ascites is present. IMPRESSION: 1. Single mobile gallstone with gallbladder wall thickening. The extent of this gallbladder wall thickening is more likely related to hepatic dysfunction/ascites. If there is strong clinical suspicion for acute cholecystitis, consider nuclear medicine study. 2. Possible mild nodular hepatic contour which may represent cirrhosis. No suspicious focal hepatic lesions. Enlarged portal vein with normal hepatopetal portal vein flow. Electronically Signed   By: Margarette Canada M.D.   On: 10/19/2018 11:08    Scheduled Meds:  folic acid  1 mg Oral Daily   potassium chloride  40 mEq Oral BID   sodium chloride flush  3 mL Intravenous Q12H   thiamine  100 mg Oral Daily   Continuous Infusions:  meropenem (MERREM) IV 1 g (10/19/18 0919)   potassium PHOSPHATE IVPB (in mmol) 20 mmol (10/19/18 1046)   sodium bicarbonate 150 mEq in dextrose 5% 1000 mL 150 mEq (10/18/18 1524)    LOS: 3 days   Kerney Elbe, DO [facility] PAGER is on [employer]  If 7PM-7AM, please contact night-coverage [email or web address] Password [email or web address] 10/19/2018, 1:29 PM

## 2018-10-19 NOTE — Care Management Important Message (Signed)
Important Message  Patient Details IM Letter given to  [place] RN to present to the PatientName: Dana Watkins MRN: [id number] Date of Birth: 05-04-1955   Medicare Important Message Given:  Yes     [name] 10/19/2018, 10:44 AM

## 2018-10-20 ENCOUNTER — Inpatient Hospital Stay: Payer: Self-pay

## 2018-10-20 DIAGNOSIS — Z1612 Extended spectrum beta lactamase (ESBL) resistance: Secondary | ICD-10-CM

## 2018-10-20 DIAGNOSIS — B9629 Other Escherichia coli [E. coli] as the cause of diseases classified elsewhere: Secondary | ICD-10-CM

## 2018-10-20 LAB — PHOSPHORUS: Phosphorus: 2.5 mg/dL (ref 2.5–4.6)

## 2018-10-20 LAB — CBC WITH DIFFERENTIAL/PLATELET
Abs Immature Granulocytes: 0.04 10*3/uL (ref 0.00–0.07)
Basophils Absolute: 0 10*3/uL (ref 0.0–0.1)
Basophils Relative: 1 %
Eosinophils Absolute: 0.2 10*3/uL (ref 0.0–0.5)
Eosinophils Relative: 3 %
HCT: 24.6 % — ABNORMAL LOW (ref 36.0–46.0)
Hemoglobin: 7.7 g/dL — ABNORMAL LOW (ref 12.0–15.0)
Immature Granulocytes: 1 %
Lymphocytes Relative: 21 %
Lymphs Abs: 1.3 10*3/uL (ref 0.7–4.0)
MCH: 29.3 pg (ref 26.0–34.0)
MCHC: 31.3 g/dL (ref 30.0–36.0)
MCV: 93.5 fL (ref 80.0–100.0)
Monocytes Absolute: 0.8 10*3/uL (ref 0.1–1.0)
Monocytes Relative: 13 %
Neutro Abs: 3.8 10*3/uL (ref 1.7–7.7)
Neutrophils Relative %: 61 %
Platelets: 77 10*3/uL — ABNORMAL LOW (ref 150–400)
RBC: 2.63 MIL/uL — ABNORMAL LOW (ref 3.87–5.11)
RDW: 17.8 % — ABNORMAL HIGH (ref 11.5–15.5)
WBC: 6.2 10*3/uL (ref 4.0–10.5)
nRBC: 0 % (ref 0.0–0.2)

## 2018-10-20 LAB — COMPREHENSIVE METABOLIC PANEL
ALT: 25 U/L (ref 0–44)
AST: 40 U/L (ref 15–41)
Albumin: 2.2 g/dL — ABNORMAL LOW (ref 3.5–5.0)
Alkaline Phosphatase: 175 U/L — ABNORMAL HIGH (ref 38–126)
Anion gap: 8 (ref 5–15)
BUN: 9 mg/dL (ref 8–23)
CO2: 20 mmol/L — ABNORMAL LOW (ref 22–32)
Calcium: 8.8 mg/dL — ABNORMAL LOW (ref 8.9–10.3)
Chloride: 112 mmol/L — ABNORMAL HIGH (ref 98–111)
Creatinine, Ser: 1.2 mg/dL — ABNORMAL HIGH (ref 0.44–1.00)
GFR calc Af Amer: 56 mL/min — ABNORMAL LOW (ref >60)
GFR calc non Af Amer: 48 mL/min — ABNORMAL LOW (ref >60)
Glucose, Bld: 98 mg/dL (ref 70–99)
Potassium: 3.7 mmol/L (ref 3.5–5.1)
Sodium: 140 mmol/L (ref 135–145)
Total Bilirubin: 2 mg/dL — ABNORMAL HIGH (ref 0.3–1.2)
Total Protein: 4.8 g/dL — ABNORMAL LOW (ref 6.5–8.1)

## 2018-10-20 LAB — MAGNESIUM: Magnesium: 2 mg/dL (ref 1.7–2.4)

## 2018-10-20 MED ORDER — SODIUM BICARBONATE-DEXTROSE 150-5 MEQ/L-% IV SOLN
150.0000 meq | INTRAVENOUS | Status: AC
  Administered 2018-10-20: 150 meq via INTRAVENOUS
  Filled 2018-10-20: qty 1000

## 2018-10-20 MED ORDER — SODIUM CHLORIDE 0.9 % IV SOLN
1.0000 g | Freq: Three times a day (TID) | INTRAVENOUS | Status: DC
  Administered 2018-10-20 – 2018-10-24 (×12): 1 g via INTRAVENOUS
  Filled 2018-10-20 (×13): qty 1

## 2018-10-20 MED ORDER — ERTAPENEM IV (FOR PTA / DISCHARGE USE ONLY): 1.0000 g | INTRAVENOUS | 0 refills | Status: DC

## 2018-10-20 MED ORDER — DIPHENHYDRAMINE HCL 25 MG PO CAPS
25.0000 mg | ORAL_CAPSULE | Freq: Once | ORAL | Status: AC
  Administered 2018-10-20: 25 mg via ORAL
  Filled 2018-10-20: qty 1

## 2018-10-20 NOTE — Progress Notes (Signed)
PICC line order received

## 2018-10-20 NOTE — Progress Notes (Signed)
PHARMACY CONSULT NOTE FOR:  OUTPATIENT  PARENTERAL ANTIBIOTIC THERAPY (OPAT)  Indication: E Coli ESBL Bacteremia Regimen: Invanz (ertapenem) 1 gm IV q24 End date: 10/24/2018  IV antibiotic discharge orders are pended. To discharging provider:  please sign these orders via discharge navigator,  Select New Orders & click on the button choice - Manage This Unsigned Work.     Thank you for allowing pharmacy to be a part of this patient's care.  Eudelia Bunch, Pharm.D [phone] 10/20/2018 5:23 PM

## 2018-10-20 NOTE — Progress Notes (Signed)
Pharmacy Antibiotic Note  Dana Watkins is a 63 y.o. female admitted on 10/16/2018 with bacteremia.  Pharmacy has been consulted for meropenem dosing. Presented with N/V and fever.  E. Coli noted in Bcx per BCID.  Ucx also with E. Coli and susceptibilities back and noted to be ESBL producing organism.  Patient currently on ceftriaxone and changing to meropenem per isolation of ESBL.   Today, 10/20/2018  Abx day 3 of 7  CKD - SCr improving  Afebrile  WBC WNL  Plan:  Meropenem 1gm IV q8h per current renal function  Follow renal function, patient with CKD  Pharmacy will follow in background and adjust dose as needed  If pt candidate for OPAT, consider ertapenem for ease of administration  Height: 5' 8"  (172.7 cm) Weight: 145 lb (65.8 kg) IBW/kg (Calculated) : 63.9  Temp (24hrs), Avg:98.2 F (36.8 C), Min:98.2 F (36.8 C), Max:98.2 F (36.8 C)  Recent Labs  Lab 10/16/18 1041 10/16/18 1042 10/16/18 1405 10/17/18 0432 10/18/18 0348 10/19/18 0523 10/20/18 0356  WBC  --  10.9*  --  6.4 6.8 6.5 6.2  CREATININE  --  1.59*  --  1.33* 1.45* 1.22* 1.20*  LATICACIDVEN 3.8*  --  2.2*  --   --  1.4  --     Estimated Creatinine Clearance: 49 mL/min (A) (by C-G formula based on SCr of 1.2 mg/dL (H)).    Allergies  Allergen Reactions  . Sulfa Antibiotics Nausea And Vomiting and Rash    Antimicrobials this admission: 10/5 vanco x 1 10/5 cefepime >> 10/6 10/6 ceftriaxone >> 10/7 10/7 meropenem >>  Dose adjustments this admission:  Microbiology results: 10/5 BCx: E. Coli - sensitive to ciprofloxacin, imipenem, nitrofurantoin and zosyn 10/5 UCx: E. Coli (ESBL)   Thank you for allowing pharmacy to be a part of this patient's care.  Orbie Pyo, PharmD Candidate 10/20/2018 12:59 PM

## 2018-10-20 NOTE — Progress Notes (Signed)
PROGRESS NOTE    Dana Watkins  OXB:[id number] DOB: November 08, 1955 DOA: 10/16/2018 PCP: Lawerance Cruel, MD   Brief Narrative:  HPI per Dr. Murray Hodgkins on 10/16/2018 63 year old woman PMH chronic thrombocytopenia, anemia, CKD stage III PRESENTED to the emergency department 10/5 for fever, cough, nausea, retching, myalgias.  Admitted for febrile illness, suspected false-negative COVID, multiple lab abnormalities.  Patient reports her symptoms began 10/1 with fever, cough, nausea with refractory dry heaves, shortness of breath, no specific aggravating or alleviating factors.  He has not been able to eat or keep anything on her stomach.  Even water comes back up.  Symptoms continue to worsen so she came to the emergency department.  Patient returned October 1 from spending a week in [place].  She flew by private check.  No history of blood clots.  **Interim History Patient was found to have a ESBL E. coli and likely associated bacteremia and her antibiotics were escalated to IV meropenem. Obtained a right upper quadrant ultrasound and evaluate her for liver cirrhosis and showed a single mobile gallstone in the gallbladder with gallbladder wall thickening which was rightly related to her hepatic dysfunction ascites and possible mild nodular hepatic contour which likely represents cirrhosis but no suspicious focal hepatic lesions and large portal vein with normal hepato-pedal portal vein flow.  Lab values are improving slowly.  Patient's mother concerned with husband is "poisoning her".  So we will check further work-up laboratory studies and they have been negative. Patient's Hospitalization has now been complicated by suspected antibiotic induced diarrhea.  Diarrhea has now improved and patient is improving significantly so will place a PICC line and try to transition her for outpatient antibiotic therapy with ertapenem.  PICC line has been ordered and still pending to be done.  OPAT orders have  been placed.  Hopeful discharge in next 24 to 48 hours.  Assessment & Plan:   Principal Problem:   SIRS (systemic inflammatory response syndrome) (HCC) Active Problems:   Thrombocytopenia (HCC)   Hypokalemia   CKD (chronic kidney disease), stage III   Acute lower UTI   Elevated troponin   Elevated d-dimer  Sepsis from an ESBL E. coli bacteremia and UTI, improving -In the setting of urinary source, patient reported suprapubic pain and discomfort for several days prior to admission -Urinalysis on admission showed moderate hemoglobin on the urine dipstick, large leukocytes, negative nitrites, many bacteria, 0-5 squamous epithelial cells, and 21-50 WBCs -Blood cultures with gram-negative rods and shows colony-forming units of ESBL E. coli which is sensitive to ciprofloxacin, imipenem, Zosyn, and Macrobid -SARS-CoV-2 testing was negative -Blood cultures x2 also show species with E. coli with sensitivities showing sensitivities to ciprofloxacin, imipenem, and Zosyn -Patient does not have flank tenderness, benign abdominal exam, she has mildly abnormal LFTs however has known diagnosis of cirrhosis and her LFTs are stable from her baseline -IV fluid hydration changed as below -Antibiotics escalated from IV ceftriaxone to IV meropenem for now we will continue IV meropenem for now and will be changed to ertapenem at discharge as PICC line will be placed today -Lactic acid level on admission was 2.2 and will repeat repeat this morning was one-point -Patient's WBC went from 10.9 is now 6.2 -Procalcitonin level went from 13.13 is now 2.03 -Continue monitor temperature curve as well as repeat CBC in a.m. -We will treat for 7 days with IV meropenem at least -Sepsis physiology is improving   Acute Kidney Injury on CKD Stage 3?,  Improving -In the setting  of above illness, poor p.o. intake -Improving with hydration, baseline creatinine is around 1.0-1.25 and recently has been ranging from 1.3 upwards  and yesterday was 1.45; Today BUN/Cr is now 9/ 1.20 -Avoid nephrotoxic medications, contrast dyes, as well as hypotension possible -IV fluid hydration as below and will renew sodium bicarbonate 150 mEq at 75 mL's per hour for another 12 hours today -Procalcitonin level on admission was 13.13 -> 2.03 -Continue to monitor and trend renal function -Repeat CMP in a.m.  Early Liver Cirrhosis noted on Imaging in the setting of fatty liver disease -From hospitalization 4 months ago -Follow-up with gastroenterology, denies excessive alcohol use but not very forthcoming with details, no history of hep C -Checked acute hepatitis panel and was Non-Reactive  -Continue with folic acid and vitamin B1 supplementation -Recommended follow-up with gastroenterology for this as an outpatient but the interim we will obtain a right upper quadrant ultrasound -RUQ U/S showed "Single mobile gallstone with gallbladder wall thickening. The extent of this gallbladder wall thickening is more likely related to hepatic dysfunction/ascites. If there is strong clinical suspicion for acute cholecystitis, consider nuclear medicine study. Possible mild nodular hepatic contour which may represent cirrhosis. No suspicious focal hepatic lesions. Enlarged portal vein with normal hepatopetal portal vein flow." -Continue to monitor carefully as patient is getting IV fluid hydration -Right upper quadrant ultrasound showed only a trace amount of perihepatic ascites -LFTs are normal now and T bili is 2.0 and stable from yesterday -She will need outpatient gastroenterology follow-up   Hypokalemia  -Has a chronic hypokalemia and supposedly on KCl 40 mEq twice daily at baseline likely has worsened in the setting of diarrhea and serum bicarbonate infusion -Patient does not know why she has chronic hypokalemia, has not been worked up by nephrology, not on any contributive meds -Magnesium is also low, given IV mag yesterday, will give  another dose today and replace potassium IV -K+ is now 3.7 and magnesium level is 2.0 -Continue to monitor and replete as necessary -Repeat CMP in AM   Hypophosphatemia -Patient's Phos Level this AM was 2.5 -Continue to Monitor and Replete as Necessary -Repeat Phos Level in the AM   Abnormal LFTs with mildly elevated AST, Hyperbilirubinemia -This is comparable to recent baseline, could be secondary to underlying cirrhosis as noted on imaging in the past -Continue to monitor and trend hepatic function panel next -Abdominal examination appears benign and T bili is trending down -AST is trending down and is now improved and 39 -T bili went from 3.0 -> 1.7 -> 2.0 -> 2.0 -Continue to monitor and trend -RUQ U/S done and showed likely Cirrhosis  -Repeat CMP in the a.m.  Normocytic Anemia Thrombocytopenia -Suspect this is secondary to splenomegaly, splenic sequestration -Checking anemia panel -Component of hemodilution likely contributing to acute worsening, no symptoms at this time will monitor -Continue to monitor for signs and symptoms of bleeding; currently no overt bleeding noted -Patient hemoglobin/hematocrit is now 7.7/24.6 -Platelet count went from 71,000 on admission is now 77,000 -Repeat CBC in AM   Metabolic Acidosis, improving  -Unclear etiology at this point but did have a Lactic Acidosis on admission at 2.3 -CO2 was 12, chloride level 114, and anion gap was 13; Now it is improving a CO2 is 20, anion gap is 8, and chloride is 112 -Changed IV fluids from normal saline with 40 mucosal KCl to sodium bicarbonate 150 mEq and will continue today at 75 mL/hr x 12 hours -Check for etiologies of acidosis including blood  work and could not find any  -Repeat CMP in a.m.  Suspected Poisoning? -Patient's mother adamantly states that the patient's ex-husband is trying to poison the patient and states that she got sick on the night that she saw her husband -We will notify social work  for further investigation -In the interim we will check an arsenic level, volatile blood level, salicylate level, as well as ethylene glycol level given the patient still has an acidosis -Patient's arsenic level was 10, ethylene glycol levels less than 5, lead levels 1, mercury level 12.5, acetone level in the blood was negative, ethanol and blood were negative, isopropyl and blood was negative, methanol and blood was negative, and salicylate level was less than 7.0 -We will have Social work look into the issue further but doubt she has been poisoned    Hyponatremia -Improved with IV fluid hydration as above; C/w IV Sodium Bicarbonate for another 12 horus -Will continue monitor and trend -Repeat CMP in AM   Cardiomegaly and vascular congestion -Continue to monitor carefully as patient is getting IV fluid hydration -Repeat chest x-ray in a.m. -BNP was elevated at 357.7 -Echocardiogram as below -She is +3.439 Liters since Admission  -Continue to Monitor for S/Sx of Volume Overload   Elevated troponin, flat -No chest pain.  EKG nonacute.  Significance unclear.  Suspect demand ischemia secondary to acute illness. -ECHOCardiogram as below  Elevated D-Dimer -Significance unclear but likely in the setting of Infection.   -She has traveled recently, but it was a short flight on a private jet.  Has no leg edema, no hypoxia, no hemodynamic instability.  VTE does not seem likely to explain her presentation.  Given her thrombocytopenia and low suspicion for PE, will not anticoagulate at this point.   -C/w Bedrest with bathroom privileges. -D-Dimer is trending down and went from 1.93 -> 1.27 -> 2.04; Will not pursue further workup or CTA at this time given lack of Renal Fxn improvement  -Repeat D-Dimer in AM  -Bilateral lower extremity venous Dopplers ordered on admission but canceled as she has no edema, signs or symptoms of DVT  Several indeterminate left hepatic lobe lesions largest of which  3.4 cm seen on ultrasound 06/2014 -Consider outpatient MRI liver protocol -Checked RUQ U/S this visit and showed "Possible mild nodular hepatic contour which may represent cirrhosis. No suspicious focal hepatic lesions. Enlarged portal vein with normal hepatopetal portal vein flow."  Left Rib Pain -Check a Left Rib X-Ray -Started Diclofenac 2 grams QID for Pain  Diarrhea, improved -Checking GI Pathogen Panel and pending to result -Do not clinically feel as if this is C Difficile as she is afebrile, has no Leukocytosis and does not have any crampy abdominal pain -Likely Abx Induced from Meropenem -Trial of Loperamide was successful  -Continue to Monitor Carefully   DVT prophylaxis: SCDs given Thrombocytopenia and Anemia  Code Status: FULL CODE  Family Communication: No family present at bedside  Disposition Plan: Pending further workup and improvement  Consultants:   Pharmacy for OPAT orders    Procedures:  ECHOCARDIOGRAM IMPRESSIONS    1. Left ventricular ejection fraction, by visual estimation, is 55 to 60%. The left ventricle has normal function. Normal left ventricular size. There is no left ventricular hypertrophy.  2. Global right ventricle has normal systolic function.The right ventricular size is normal. No increase in right ventricular wall thickness.  3. Left atrial size was severely dilated.  4. Right atrial size was normal.  5. Moderate to severe mitral annular  calcification.  6. The mitral valve is normal in structure. There is eccentric mitral regurgitation, probably moderate, posteriorly directed. It may be underestimated.  7. The tricuspid valve is normal in structure. Tricuspid valve regurgitation is mild.  8. The aortic valve is normal in structure. Aortic valve regurgitation was not visualized by color flow Doppler.  9. The pulmonic valve was normal in structure. Pulmonic valve regurgitation is not visualized by color flow Doppler. 10. Moderately elevated  pulmonary artery systolic pressure. 11. The inferior vena cava is dilated in size with <50% respiratory variability, suggesting right atrial pressure of 15 mmHg. 12. Consider TEE if the clinical exam suggests severe mitral insufficiency.  FINDINGS  Left Ventricle: Left ventricular ejection fraction, by visual estimation, is 55 to 60%. The left ventricle has normal function. No evidence of left ventricular regional wall motion abnormalities. There is no left ventricular hypertrophy. Normal left  ventricular size. Spectral Doppler shows Left ventricular diastolic parameters were normal pattern of LV diastolic filling. Normal left ventricular filling pressures.  Right Ventricle: The right ventricular size is normal. No increase in right ventricular wall thickness. Global RV systolic function is has normal systolic function. The tricuspid regurgitant velocity is 2.99 m/s, and with an assumed right atrial pressure  of 15 mmHg, the estimated right ventricular systolic pressure is moderately elevated at 50.8 mmHg.  Left Atrium: Left atrial size was severely dilated.  Right Atrium: Right atrial size was normal in size  Pericardium: There is no evidence of pericardial effusion.  Mitral Valve: The mitral valve is normal in structure. Moderate to severe mitral annular calcification. Mild mitral valve regurgitation, with eccentric laterally directed jet.  Tricuspid Valve: The tricuspid valve is normal in structure. Tricuspid valve regurgitation is mild by color flow Doppler.  Aortic Valve: The aortic valve is normal in structure. Aortic valve regurgitation was not visualized by color flow Doppler.  Pulmonic Valve: The pulmonic valve was normal in structure. Pulmonic valve regurgitation is not visualized by color flow Doppler.  Aorta: The aortic root and ascending aorta are structurally normal, with no evidence of dilitation.  Venous: The inferior vena cava is dilated in size with less than  50% respiratory variability, suggesting right atrial pressure of 15 mmHg.  IAS/Shunts: No atrial level shunt detected by color flow Doppler.     LEFT VENTRICLE PLAX 2D LVIDd:         5.40 cm  Diastology LVIDs:         3.00 cm  LV e' lateral:   10.60 cm/s LV PW:         1.20 cm  LV E/e' lateral: 9.6 LV IVS:        1.10 cm  LV e' medial:    10.40 cm/s LVOT diam:     2.10 cm  LV E/e' medial:  9.8 LV SV:         106 ml LV SV Index:   59.84 LVOT Area:     3.46 cm    RIGHT VENTRICLE RV S prime:     18.50 cm/s TAPSE (M-mode): 3.3 cm  LEFT ATRIUM              Index LA diam:        5.00 cm  2.80 cm/m LA Vol (A2C):   78.9 ml  44.26 ml/m LA Vol (A4C):   117.0 ml 65.63 ml/m LA Biplane Vol: 101.0 ml 56.66 ml/m  AORTIC VALVE LVOT Vmax:   106.00 cm/s LVOT Vmean:  69.500 cm/s LVOT  VTI:    0.174 m   AORTA Ao Root diam: 2.90 cm Ao Asc diam:  3.00 cm  MITRAL VALVE                         TRICUSPID VALVE MV Area (PHT): 4.36 cm              TR Peak grad:   35.8 mmHg MV PHT:        50.46 msec            TR Vmax:        299.00 cm/s MV Decel Time: 174 msec MV E velocity: 102.00 cm/s 103 cm/s  SHUNTS MV A velocity: 44.60 cm/s  70.3 cm/s Systemic VTI:  0.17 m MV E/A ratio:  2.29        1.5       Systemic Diam: 2.10 cm   Antimicrobials:  Anti-infectives (From admission, onward)   Start     Dose/Rate Route Frequency Ordered Stop   10/20/18 1600  meropenem (MERREM) 1 g in sodium chloride 0.9 % 100 mL IVPB     1 g 200 mL/hr over 30 Minutes Intravenous Every 8 hours 10/20/18 1257     10/20/18 0000  ertapenem (INVANZ) IVPB     1 g Intravenous Every 24 hours 10/20/18 1730 10/24/18 2359   10/18/18 1000  meropenem (MERREM) 1 g in sodium chloride 0.9 % 100 mL IVPB  Status:  Discontinued     1 g 200 mL/hr over 30 Minutes Intravenous Every 12 hours 10/18/18 0836 10/20/18 1257   10/17/18 1000  vancomycin (VANCOCIN) IVPB 750 mg/150 ml premix  Status:  Discontinued     750 mg 150 mL/hr  over 60 Minutes Intravenous Every 24 hours 10/17/18 0443 10/17/18 0654   10/17/18 1000  cefTRIAXone (ROCEPHIN) 2 g in sodium chloride 0.9 % 100 mL IVPB  Status:  Discontinued     2 g 200 mL/hr over 30 Minutes Intravenous Every 24 hours 10/17/18 0655 10/18/18 0836   10/17/18 0600  ceFEPIme (MAXIPIME) 2 g in sodium chloride 0.9 % 100 mL IVPB  Status:  Discontinued     2 g 200 mL/hr over 30 Minutes Intravenous Every 12 hours 10/17/18 0443 10/17/18 0654   10/16/18 2000  ceFEPIme (MAXIPIME) 2 g in sodium chloride 0.9 % 100 mL IVPB  Status:  Discontinued     2 g 200 mL/hr over 30 Minutes Intravenous  Once 10/16/18 1956 10/17/18 0031   10/16/18 2000  vancomycin (VANCOCIN) IVPB 1000 mg/200 mL premix  Status:  Discontinued     1,000 mg 200 mL/hr over 60 Minutes Intravenous  Once 10/16/18 1956 10/17/18 0101   10/16/18 1230  ceFEPIme (MAXIPIME) 2 g in sodium chloride 0.9 % 100 mL IVPB     2 g 200 mL/hr over 30 Minutes Intravenous  Once 10/16/18 1218 10/16/18 1429   10/16/18 1230  vancomycin (VANCOCIN) IVPB 1000 mg/200 mL premix     1,000 mg 200 mL/hr over 60 Minutes Intravenous  Once 10/16/18 1218 10/16/18 1900     Subjective: Seen and examined at bedside and states that she was much improved and felt well and back to her baseline.  Wanting to shower.  No chest pain, lightheadedness or dizziness and states that the rib pain is improved after diclofenac.  No nausea or vomiting.  States diarrhea is improved after loperamide.  No other concerns or complaints at this time.  Objective: Vitals:  10/19/18 1254 10/19/18 2029 10/20/18 0518 10/20/18 1220  BP: 129/72 118/61 117/69 117/66  Pulse: 79 85 79 84  Resp: (!) 21 16 18 18   Temp: 98.7 F (37.1 C)   98.2 F (36.8 C)  TempSrc: Oral     SpO2: 100% 100% 98% 100%  Weight:      Height:       No intake or output data in the 24 hours ending 10/20/18 1730 Filed Weights   10/16/18 2200  Weight: 65.8 kg   Examination: Physical  Exam:  Constitutional: WN/WD Caucasian female in NAD and appears calm comfortable Eyes: Lids and conjunctivae normal, sclerae anicteric  ENMT: External Ears, Nose appear normal. Grossly normal hearing. Mucous membranes are moist.  Neck: Appears normal, supple, no cervical masses, normal ROM, no appreciable thyromegaly; no JVD Respiratory: Diminished to auscultation bilaterally, no wheezing, rales, rhonchi or crackles. Normal respiratory effort and patient is not tachypenic. No accessory muscle use.  Cardiovascular: RRR, no murmurs / rubs / gallops. S1 and S2 auscultated. No extremity edema. Abdomen: Soft, non-tender, non-distended. Bowel sounds positive x4.  GU: Deferred. Musculoskeletal: No clubbing / cyanosis of digits/nails. No joint deformity upper and lower extremities.  Skin: No rashes, lesions, ulcers on a limited skin evaluation. No induration; Warm and dry.  Neurologic: CN 2-12 grossly intact with no focal deficits.  Romberg sign and cerebellar reflexes not assessed.  Psychiatric: Normal judgment and insight. Alert and oriented x 3. Pleasant mood and appropriate affect.   Data Reviewed: I have personally reviewed following labs and imaging studies  CBC: Recent Labs  Lab 10/16/18 1042 10/17/18 0432 10/18/18 0348 10/19/18 0523 10/20/18 0356  WBC 10.9* 6.4 6.8 6.5 6.2  NEUTROABS 8.6*  --   --  4.5 3.8  HGB 8.1* 7.6* 7.8* 7.6* 7.7*  HCT 24.8* 24.0* 25.3* 23.9* 24.6*  MCV 91.9 94.5 95.5 93.0 93.5  PLT 71* 61* 79* 78* 77*   Basic Metabolic Panel: Recent Labs  Lab 10/16/18 1042 10/17/18 0432 10/18/18 0348 10/19/18 0523 10/20/18 0356  NA 133* 139 139 138 140  K 2.2* 2.9* 3.6 2.9* 3.7  CL 109 115* 114* 113* 112*  CO2 16* 17* 12* 18* 20*  GLUCOSE 82 100* 111* 98 98  BUN 19 15 12 9 9   CREATININE 1.59* 1.33* 1.45* 1.22* 1.20*  CALCIUM 9.0 8.6* 9.3 8.5* 8.8*  MG  --  2.5*  --  2.0 2.0  PHOS  --  1.7*  --  1.5* 2.5   GFR: Estimated Creatinine Clearance: 49 mL/min (A)  (by C-G formula based on SCr of 1.2 mg/dL (H)). Liver Function Tests: Recent Labs  Lab 10/16/18 1042 10/17/18 0432 10/18/18 0348 10/19/18 0523 10/20/18 0356  AST 55* 44* 42* 39 40  ALT 30 27 27 24 25   ALKPHOS 122 112 161* 158* 175*  BILITOT 2.9* 3.0* 1.7* 2.0* 2.0*  PROT 5.7* 5.2* 5.3* 5.0* 4.8*  ALBUMIN 2.8* 2.5* 2.3* 2.2* 2.2*   No results for input(s): LIPASE, AMYLASE in the last 168 hours. No results for input(s): AMMONIA in the last 168 hours. Coagulation Profile: No results for input(s): INR, PROTIME in the last 168 hours. Cardiac Enzymes: No results for input(s): CKTOTAL, CKMB, CKMBINDEX, TROPONINI in the last 168 hours. BNP (last 3 results) No results for input(s): PROBNP in the last 8760 hours. HbA1C: No results for input(s): HGBA1C in the last 72 hours. CBG: No results for input(s): GLUCAP in the last 168 hours. Lipid Profile: No results for input(s): CHOL, HDL,  LDLCALC, TRIG, CHOLHDL, LDLDIRECT in the last 72 hours. Thyroid Function Tests: No results for input(s): TSH, T4TOTAL, FREET4, T3FREE, THYROIDAB in the last 72 hours. Anemia Panel: No results for input(s): VITAMINB12, FOLATE, FERRITIN, TIBC, IRON, RETICCTPCT in the last 72 hours. Sepsis Labs: Recent Labs  Lab 10/16/18 1041 10/16/18 1042 10/16/18 1405 10/19/18 0523  PROCALCITON  --  13.13  --  2.03  LATICACIDVEN 3.8*  --  2.2* 1.4    Recent Results (from the past 240 hour(s))  Blood Culture (routine x 2)     Status: Abnormal   Collection Time: 10/16/18 10:45 AM   Specimen: BLOOD RIGHT FOREARM  Result Value Ref Range Status   Specimen Description   Final    BLOOD RIGHT FOREARM Performed at [facility] [address], [place], [place] [address]    Special Requests   Final    BOTTLES DRAWN AEROBIC AND ANAEROBIC Blood Culture adequate volume Performed at [facility] [address] [place], [place] [address]    Culture  Setup Time   Final    GRAM  NEGATIVE RODS CRITICAL RESULT CALLED TO, READ BACK BY AND VERIFIED WITH: PHRMD J GIMSLEY @0259  10/17/18 BY S GEZAHEGN IN BOTH AEROBIC AND ANAEROBIC BOTTLES Performed at [facility], [place] [address] [place], [place] [address]    Culture (A)  Final    ESCHERICHIA COLI Confirmed Extended Spectrum Beta-Lactamase Producer (ESBL).  In bloodstream infections from ESBL organisms, carbapenems are preferred over piperacillin/tazobactam. They are shown to have a lower risk of mortality.    Report Status 10/18/2018 FINAL  Final   Organism ID, Bacteria ESCHERICHIA COLI  Final      Susceptibility   Escherichia coli - MIC*    AMPICILLIN >=32 RESISTANT Resistant     CEFAZOLIN >=64 RESISTANT Resistant     CEFEPIME >=64 RESISTANT Resistant     CEFTAZIDIME RESISTANT Resistant     CEFTRIAXONE >=64 RESISTANT Resistant     CIPROFLOXACIN 1 SENSITIVE Sensitive     GENTAMICIN >=16 RESISTANT Resistant     IMIPENEM <=0.25 SENSITIVE Sensitive     TRIMETH/SULFA >=320 RESISTANT Resistant     AMPICILLIN/SULBACTAM >=32 RESISTANT Resistant     PIP/TAZO <=4 SENSITIVE Sensitive     Extended ESBL POSITIVE Resistant     * ESCHERICHIA COLI  Blood Culture ID Panel (Reflexed)     Status: Abnormal   Collection Time: 10/16/18 10:45 AM  Result Value Ref Range Status   Enterococcus species NOT DETECTED NOT DETECTED Final   Listeria monocytogenes NOT DETECTED NOT DETECTED Final   Staphylococcus species NOT DETECTED NOT DETECTED Final   Staphylococcus aureus (BCID) NOT DETECTED NOT DETECTED Final   Streptococcus species NOT DETECTED NOT DETECTED Final   Streptococcus agalactiae NOT DETECTED NOT DETECTED Final   Streptococcus pneumoniae NOT DETECTED NOT DETECTED Final   Streptococcus pyogenes NOT DETECTED NOT DETECTED Final   Acinetobacter baumannii NOT DETECTED NOT DETECTED Final   Enterobacteriaceae species DETECTED (A) NOT DETECTED Final    Comment: Enterobacteriaceae represent a large family of gram-negative  bacteria, not a single organism. CRITICAL RESULT CALLED TO, READ BACK BY AND VERIFIED WITH: PHRMD J GIMSLEY @0259  10/17/18 BY S GEZAHEGN    Enterobacter cloacae complex NOT DETECTED NOT DETECTED Final   Escherichia coli DETECTED (A) NOT DETECTED Final    Comment: CRITICAL RESULT CALLED TO, READ BACK BY AND VERIFIED WITH: PHRMD J GIMSLEY @0259  10/17/18 BY S GEZAHEGN    Klebsiella oxytoca NOT DETECTED  NOT DETECTED Final   Klebsiella pneumoniae NOT DETECTED NOT DETECTED Final   Proteus species NOT DETECTED NOT DETECTED Final   Serratia marcescens NOT DETECTED NOT DETECTED Final   Carbapenem resistance NOT DETECTED NOT DETECTED Final   Haemophilus influenzae NOT DETECTED NOT DETECTED Final   Neisseria meningitidis NOT DETECTED NOT DETECTED Final   Pseudomonas aeruginosa NOT DETECTED NOT DETECTED Final   Candida albicans NOT DETECTED NOT DETECTED Final   Candida glabrata NOT DETECTED NOT DETECTED Final   Candida krusei NOT DETECTED NOT DETECTED Final   Candida parapsilosis NOT DETECTED NOT DETECTED Final   Candida tropicalis NOT DETECTED NOT DETECTED Final    Comment: Performed at [facility], [place] [address] [place], [place] [address]  SARS Coronavirus 2 [facility] order, Performed in [facility] hospital lab) Nasopharyngeal Nasopharyngeal Swab     Status: None   Collection Time: 10/16/18 10:54 AM   Specimen: Nasopharyngeal Swab  Result Value Ref Range Status   SARS Coronavirus 2 NEGATIVE NEGATIVE Final    Comment: (NOTE) If result is NEGATIVE SARS-CoV-2 target nucleic acids are NOT DETECTED. The SARS-CoV-2 RNA is generally detectable in upper and lower  respiratory specimens during the acute phase of infection. The lowest  concentration of SARS-CoV-2 viral copies this assay can detect is 250  copies / mL. A negative result does not preclude SARS-CoV-2 infection  and should not be used as the sole basis for treatment or other  patient management decisions.  A negative result may  occur with  improper specimen collection / handling, submission of specimen other  than nasopharyngeal swab, presence of viral mutation(s) within the  areas targeted by this assay, and inadequate number of viral copies  (<250 copies / mL). A negative result must be combined with clinical  observations, patient history, and epidemiological information. If result is POSITIVE SARS-CoV-2 target nucleic acids are DETECTED. The SARS-CoV-2 RNA is generally detectable in upper and lower  respiratory specimens dur ing the acute phase of infection.  Positive  results are indicative of active infection with SARS-CoV-2.  Clinical  correlation with patient history and other diagnostic information is  necessary to determine patient infection status.  Positive results do  not rule out bacterial infection or co-infection with other viruses. If result is PRESUMPTIVE POSTIVE SARS-CoV-2 nucleic acids MAY BE PRESENT.   A presumptive positive result was obtained on the submitted specimen  and confirmed on repeat testing.  While 2019 novel coronavirus  (SARS-CoV-2) nucleic acids may be present in the submitted sample  additional confirmatory testing may be necessary for epidemiological  and / or clinical management purposes  to differentiate between  SARS-CoV-2 and other Sarbecovirus currently known to infect humans.  If clinically indicated additional testing with an alternate test  methodology [phone]) is advised. The SARS-CoV-2 RNA is generally  detectable in upper and lower respiratory sp ecimens during the acute  phase of infection. The expected result is Negative. Fact Sheet for Patients:  StrictlyIdeas.no Fact Sheet for Healthcare Providers: BankingDealers.co.za This test is not yet approved or cleared by the [place] FDA and has been authorized for detection and/or diagnosis of SARS-CoV-2 by FDA under an Emergency Use Authorization (EUA).  This  EUA will remain in effect (meaning this test can be used) for the duration of the COVID-19 declaration under Section 564(b)(1) of the Act, 21 U.S.C. section 360bbb-3(b)(1), unless the authorization is terminated or revoked sooner. Performed at [facility], [place] [address], [place], [place] [address]  Blood Culture (routine x 2)     Status: Abnormal   Collection Time: 10/16/18 11:45 AM   Specimen: BLOOD LEFT HAND  Result Value Ref Range Status   Specimen Description   Final    BLOOD LEFT HAND Performed at [facility] [address] [place], [place] [address]    Special Requests   Final    BOTTLES DRAWN AEROBIC AND ANAEROBIC Blood Culture adequate volume Performed at [facility] [address] [place], [place] [address]    Culture  Setup Time   Final    GRAM NEGATIVE RODS CRITICAL VALUE NOTED.  VALUE IS CONSISTENT WITH PREVIOUSLY REPORTED AND CALLED VALUE. IN BOTH AEROBIC AND ANAEROBIC BOTTLES    Culture (A)  Final    ESCHERICHIA COLI SUSCEPTIBILITIES PERFORMED ON PREVIOUS CULTURE WITHIN THE LAST 5 DAYS. Performed at [facility], [place] [address], [place], [place] [address]    Report Status 10/19/2018 FINAL  Final  Urine culture     Status: Abnormal   Collection Time: 10/16/18  2:26 PM   Specimen: Urine, Random  Result Value Ref Range Status   Specimen Description   Final    URINE, RANDOM Performed at [facility] [address] [place], [place] [address]    Special Requests   Final    NONE Performed at [facility], [place] [address] [place], [place] [address]    Culture (A)  Final    >=100,000 COLONIES/mL ESCHERICHIA COLI Confirmed Extended Spectrum Beta-Lactamase Producer (ESBL).  In bloodstream infections from ESBL organisms, carbapenems are preferred over piperacillin/tazobactam. They are shown to have a lower risk of mortality.    Report Status 10/18/2018 FINAL   Final   Organism ID, Bacteria ESCHERICHIA COLI (A)  Final      Susceptibility   Escherichia coli - MIC*    AMPICILLIN >=32 RESISTANT Resistant     CEFAZOLIN >=64 RESISTANT Resistant     CEFTRIAXONE >=64 RESISTANT Resistant     CIPROFLOXACIN 1 SENSITIVE Sensitive     GENTAMICIN >=16 RESISTANT Resistant     IMIPENEM <=0.25 SENSITIVE Sensitive     NITROFURANTOIN <=16 SENSITIVE Sensitive     TRIMETH/SULFA >=320 RESISTANT Resistant     AMPICILLIN/SULBACTAM >=32 RESISTANT Resistant     PIP/TAZO <=4 SENSITIVE Sensitive     Extended ESBL POSITIVE Resistant     * >=100,000 COLONIES/mL ESCHERICHIA COLI    Radiology Studies: Dg Ribs Unilateral Left  Result Date: 10/19/2018 CLINICAL DATA:  Left anterior rib pain.  No injury. EXAM: LEFT RIBS - 2 VIEW COMPARISON:  Chest x-ray 10/16/2018 and chest x-ray earlier today. FINDINGS: Old lateral sixth rib fracture. Suggestion of acute minimally displaced fractures of the left anterolateral seventh, eighth and ninth ribs. No additional fractures. Remainder the exam is unchanged. IMPRESSION: Fractures of the anterolateral left seventh through ninth ribs. Old left sixth rib fracture. Electronically Signed   By: Marin Olp M.D.   On: 10/19/2018 19:40   Dg Chest Port 1 View  Result Date: 10/19/2018 CLINICAL DATA:  Shortness of breath. EXAM: PORTABLE CHEST 1 VIEW COMPARISON:  10/16/2018 and 06/25/2018 FINDINGS: Lungs are adequately inflated with subtle hazy prominence of the perihilar markings suggesting mild vascular congestion. No definite lobar consolidation. No effusion. Mild stable cardiomegaly. Remainder of the exam is unchanged. IMPRESSION: Mild stable cardiomegaly with suggestion of mild vascular congestion. Electronically Signed   By: Marin Olp M.D.   On: 10/19/2018 19:36   [place] Ekg  Site Rite  Result Date: 10/20/2018 If Site Rite image not attached, placement could not be confirmed due to current cardiac rhythm.  US Abdomen Limited Ruq  Result  Date: 10/19/2018 CLINICAL DATA:  63 year old female with abnormal LFTs. EXAM: ULTRASOUND ABDOMEN LIMITED RIGHT UPPER QUADRANT COMPARISON:  None. FINDINGS: Gallbladder: Gallbladder wall thickening/edema noted measuring up to 11 mm. A single mobile 6 mm gallstone is noted. No sonographic Murphy sign noted. Common bile duct: Diameter: 2 mm.  No intrahepatic or extrahepatic biliary dilatation. Liver: Possible minimal hepatic contour nodularity noted which may represent cirrhosis. No focal hepatic masses are present. A 1.6 cm LEFT hepatic cyst is present. The portal vein is enlarged measuring up to 1.9 cm in greatest diameter. Portal vein is patent on color Doppler imaging with normal direction of blood flow towards the liver. Other: A trace amount of perihepatic ascites is present. IMPRESSION: 1. Single mobile gallstone with gallbladder wall thickening. The extent of this gallbladder wall thickening is more likely related to hepatic dysfunction/ascites. If there is strong clinical suspicion for acute cholecystitis, consider nuclear medicine study. 2. Possible mild nodular hepatic contour which may represent cirrhosis. No suspicious focal hepatic lesions. Enlarged portal vein with normal hepatopetal portal vein flow. Electronically Signed   By: Margarette Canada M.D.   On: 10/19/2018 11:08   Scheduled Meds:  diclofenac sodium  2 g Topical QID   folic acid  1 mg Oral Daily   sodium chloride flush  3 mL Intravenous Q12H   thiamine  100 mg Oral Daily   Continuous Infusions:  meropenem (MERREM) IV 1 g (10/20/18 1526)   sodium bicarbonate 150 mEq in dextrose 5% 1000 mL 150 mEq (10/20/18 0956)    LOS: 4 days   Kerney Elbe, DO [facility] PAGER is on AMION  If 7PM-7AM, please contact night-coverage [email or web address] Password [email or web address] 10/20/2018, 5:30 PM

## 2018-10-20 NOTE — Plan of Care (Signed)
Plan of care reviewed and discussed with the patient. 

## 2018-10-21 LAB — CBC WITH DIFFERENTIAL/PLATELET
Abs Immature Granulocytes: 0.03 10*3/uL (ref 0.00–0.07)
Basophils Absolute: 0 10*3/uL (ref 0.0–0.1)
Basophils Relative: 0 %
Eosinophils Absolute: 0.2 10*3/uL (ref 0.0–0.5)
Eosinophils Relative: 3 %
HCT: 25.1 % — ABNORMAL LOW (ref 36.0–46.0)
Hemoglobin: 7.7 g/dL — ABNORMAL LOW (ref 12.0–15.0)
Immature Granulocytes: 1 %
Lymphocytes Relative: 21 %
Lymphs Abs: 1.3 10*3/uL (ref 0.7–4.0)
MCH: 28.8 pg (ref 26.0–34.0)
MCHC: 30.7 g/dL (ref 30.0–36.0)
MCV: 94 fL (ref 80.0–100.0)
Monocytes Absolute: 0.7 10*3/uL (ref 0.1–1.0)
Monocytes Relative: 12 %
Neutro Abs: 3.8 10*3/uL (ref 1.7–7.7)
Neutrophils Relative %: 63 %
Platelets: 91 10*3/uL — ABNORMAL LOW (ref 150–400)
RBC: 2.67 MIL/uL — ABNORMAL LOW (ref 3.87–5.11)
RDW: 18 % — ABNORMAL HIGH (ref 11.5–15.5)
WBC: 6 10*3/uL (ref 4.0–10.5)
nRBC: 0 % (ref 0.0–0.2)

## 2018-10-21 LAB — COMPREHENSIVE METABOLIC PANEL
ALT: 23 U/L (ref 0–44)
AST: 45 U/L — ABNORMAL HIGH (ref 15–41)
Albumin: 2.3 g/dL — ABNORMAL LOW (ref 3.5–5.0)
Alkaline Phosphatase: 184 U/L — ABNORMAL HIGH (ref 38–126)
Anion gap: 7 (ref 5–15)
BUN: 11 mg/dL (ref 8–23)
CO2: 21 mmol/L — ABNORMAL LOW (ref 22–32)
Calcium: 9.1 mg/dL (ref 8.9–10.3)
Chloride: 112 mmol/L — ABNORMAL HIGH (ref 98–111)
Creatinine, Ser: 1.19 mg/dL — ABNORMAL HIGH (ref 0.44–1.00)
GFR calc Af Amer: 57 mL/min — ABNORMAL LOW (ref >60)
GFR calc non Af Amer: 49 mL/min — ABNORMAL LOW (ref >60)
Glucose, Bld: 89 mg/dL (ref 70–99)
Potassium: 3.6 mmol/L (ref 3.5–5.1)
Sodium: 140 mmol/L (ref 135–145)
Total Bilirubin: 2.4 mg/dL — ABNORMAL HIGH (ref 0.3–1.2)
Total Protein: 5 g/dL — ABNORMAL LOW (ref 6.5–8.1)

## 2018-10-21 LAB — MAGNESIUM: Magnesium: 2.2 mg/dL (ref 1.7–2.4)

## 2018-10-21 LAB — PHOSPHORUS: Phosphorus: 2.4 mg/dL — ABNORMAL LOW (ref 2.5–4.6)

## 2018-10-21 MED ORDER — K PHOS MONO-SOD PHOS DI & MONO 155-852-130 MG PO TABS
500.0000 mg | ORAL_TABLET | Freq: Two times a day (BID) | ORAL | Status: AC
  Administered 2018-10-21 (×2): 500 mg via ORAL
  Filled 2018-10-21 (×2): qty 2

## 2018-10-21 MED ORDER — SODIUM CHLORIDE 0.9% FLUSH
10.0000 mL | Freq: Two times a day (BID) | INTRAVENOUS | Status: DC
  Administered 2018-10-22 – 2018-10-23 (×4): 10 mL

## 2018-10-21 MED ORDER — SODIUM CHLORIDE 0.9% FLUSH
10.0000 mL | INTRAVENOUS | Status: DC | PRN
  Administered 2018-10-21: 10 mL
  Filled 2018-10-21: qty 40

## 2018-10-21 NOTE — Progress Notes (Signed)
OT Cancellation Note  Patient Details Name: Dana Watkins MRN: [id number] DOB: 1955/11/14   Cancelled Treatment:    Reason Eval/Treat Not Completed: PT informed that Pt was up moving independently in room, organizing room items and no concerns. OT screened, no needs identified, will sign off.  [place] 10/21/2018, 3:46 PM   Hulda Humphrey OTR/L Acute Rehabilitation Services Pager: [phone] Office: [phone]

## 2018-10-21 NOTE — TOC Transition Note (Signed)
Transition of Care [facility]) - CM/SW Discharge Note   Patient Details  Name: Dana Watkins MRN: [id number] Date of Birth: 1955/05/25  Transition of Care [facility] CM/SW Contact:  Dessa Phi, RN Phone Number: 10/21/2018, 4:12 PM   Clinical Narrative:  [facility] Cluck rep Pam following for iv abx med. Await script for med. [place] rep Melissa HHRN-iv abx instruction-await HHRN order, face to face.     Final next level of care: [place] Barriers to Discharge: Continued Medical Work up   Patient Goals and CMS Choice     Choice offered to / list presented to : Patient  Discharge Placement                       Discharge Plan and Services                          HH Arranged: RN, IV Antibiotics HH Agency: Tour manager, [place] ([facility]) Date [facility] Agency Contacted: 10/21/18 Time [place]: 1611 Representative spoke with at [place]: [place] [employer]am  Social Determinants of Health ([place]) Interventions     Readmission Risk Interventions No flowsheet data found.

## 2018-10-21 NOTE — Progress Notes (Signed)
PROGRESS NOTE    Dana Watkins  INO:[id number] DOB: [birth year] DOA: 10/16/2018 PCP: Lawerance Cruel, MD   Brief Narrative:  HPI per Dr. Murray Hodgkins on 10/16/2018 63 year old woman PMH chronic thrombocytopenia, anemia, CKD stage III PRESENTED to the emergency department 10/5 for fever, cough, nausea, retching, myalgias.  Admitted for febrile illness, suspected false-negative COVID, multiple lab abnormalities.  Patient reports her symptoms began 10/1 with fever, cough, nausea with refractory dry heaves, shortness of breath, no specific aggravating or alleviating factors.  He has not been able to eat or keep anything on her stomach.  Even water comes back up.  Symptoms continue to worsen so she came to the emergency department.  Patient returned October 1 from spending a week in [place].  She flew by private check.  No history of blood clots.  **Interim History Patient was found to have a ESBL E. coli and likely associated bacteremia and her antibiotics were escalated to IV meropenem. Obtained a right upper quadrant ultrasound and evaluate her for liver cirrhosis and showed a single mobile gallstone in the gallbladder with gallbladder wall thickening which was rightly related to her hepatic dysfunction ascites and possible mild nodular hepatic contour which likely represents cirrhosis but no suspicious focal hepatic lesions and large portal vein with normal hepato-pedal portal vein flow.  Lab values are improving slowly.  Patient's mother concerned with husband is "poisoning her".  So we will check further work-up laboratory studies and they have been negative. Patient's Hospitalization has now been complicated by suspected antibiotic induced diarrhea.  Diarrhea has now improved and patient is improving significantly so will place a PICC line and try to transition her for outpatient antibiotic therapy with ertapenem.  PICC line has been ordered and still pending to be done and this was changed  to a Midline given short duration of Abx.  OPAT orders have been placed.  Hopeful discharge in next 24 to 48 hours if Case Management can arrange Antibiotics and Home Health.   Assessment & Plan:   Principal Problem:   SIRS (systemic inflammatory response syndrome) (HCC) Active Problems:   Thrombocytopenia (HCC)   Hypokalemia   CKD (chronic kidney disease), stage III   Acute lower UTI   Elevated troponin   Elevated d-dimer  Sepsis from an ESBL E. coli bacteremia and UTI, improving -In the setting of urinary source, patient reported suprapubic pain and discomfort for several days prior to admission -Urinalysis on admission showed moderate hemoglobin on the urine dipstick, large leukocytes, negative nitrites, many bacteria, 0-5 squamous epithelial cells, and 21-50 WBCs -Blood cultures with gram-negative rods and shows colony-forming units of ESBL E. coli which is sensitive to ciprofloxacin, imipenem, Zosyn, and Macrobid -SARS-CoV-2 testing was negative -Blood cultures x2 also show species with E. coli with sensitivities showing sensitivities to ciprofloxacin, imipenem, and Zosyn -Patient does not have flank tenderness, benign abdominal exam, she has mildly abnormal LFTs however has known diagnosis of cirrhosis and her LFTs are stable from her baseline -IV fluid hydration changed as below -Antibiotics escalated from IV ceftriaxone to IV meropenem for now we will continue IV meropenem for now and will be changed to ertapenem at discharge; PICC Line changed to a Midline  -Lactic acid level on admission was 2.2 and will repeat repeat this morning was one-point -Patient's WBC went from 10.9 is now 6.0 -Procalcitonin level went from 13.13 is now 2.03 -Continue monitor temperature curve as well as repeat CBC in a.m. -We will treat for 7 days with  IV meropenem at least and complete Abx next week -Sepsis physiology is improving   Acute Kidney Injury on CKD Stage 3?,  Improving -In the setting of  above illness, poor p.o. intake -Improving with hydration, baseline creatinine is around 1.0-1.25 and recently has been ranging from 1.3 upwards and yesterday was 1.45; Today BUN/Cr is now 11/1.19 -Avoid nephrotoxic medications, contrast dyes, as well as hypotension possible -IV fluid hydration as below and will renew sodium bicarbonate 150 mEq at 75 mL's per hour for another 12 hours today -Procalcitonin level on admission was 13.13 -> 2.03 -Continue to monitor and trend renal function -Repeat CMP in a.m.  Early Liver Cirrhosis noted on Imaging in the setting of fatty liver disease -From hospitalization 4 months ago -Follow-up with gastroenterology, denies excessive alcohol use but not very forthcoming with details, no history of hep C -Checked acute hepatitis panel and was Non-Reactive  -Continue with folic acid and vitamin B1 supplementation -Recommended follow-up with gastroenterology for this as an outpatient but the interim we will obtain a right upper quadrant ultrasound -RUQ U/S showed "Single mobile gallstone with gallbladder wall thickening. The extent of this gallbladder wall thickening is more likely related to hepatic dysfunction/ascites. If there is strong clinical suspicion for acute cholecystitis, consider nuclear medicine study. Possible mild nodular hepatic contour which may represent cirrhosis. No suspicious focal hepatic lesions. Enlarged portal vein with normal hepatopetal portal vein flow." -Continue to monitor carefully as patient is getting IV fluid hydration -Right upper quadrant ultrasound showed only a trace amount of perihepatic ascites -LFTs showed slight increase in AST as it is 45 now and T Bili is now 2.4 -She will need outpatient gastroenterology follow-up   Hypokalemia  -Has a chronic hypokalemia and supposedly on KCl 40 mEq twice daily at baseline likely has worsened in the setting of diarrhea and serum bicarbonate infusion -Patient does not know why she  has chronic hypokalemia, has not been worked up by nephrology, not on any contributive meds -Magnesium is also low, given IV mag yesterday, will give another dose today and replace potassium IV -K+ is now 3.6 and magnesium level is 2.2 -Replete with po K Phos Neutral 500 mg po BID x2 Doses -Continue to monitor and replete as necessary -Repeat CMP in AM   Hypophosphatemia -Patient's Phos Level this AM was 2.4 -Replete with po K Phos Neutral 500 mg po BID x2 Doses  -Continue to Monitor and Replete as Necessary -Repeat Phos Level in the AM   Abnormal LFTs with mildly elevated AST, Hyperbilirubinemia -This is comparable to recent baseline, could be secondary to underlying cirrhosis as noted on imaging in the past -Continue to monitor and trend hepatic function panel next -Abdominal examination appears benign and T bili is trending down -AST was trending down and and had improved to 39 but it is now 69 -T bili went from 3.0 -> 1.7 -> 2.0 -> 2.0 -> 2.4 -Continue to monitor and trend -RUQ U/S done and showed likely Cirrhosis  -Repeat CMP in the a.m.  Normocytic Anemia Thrombocytopenia -Suspect this is secondary to splenomegaly, splenic sequestration -Checking anemia panel -Component of hemodilution likely contributing to acute worsening, no symptoms at this time will monitor -Continue to monitor for signs and symptoms of bleeding; currently no overt bleeding noted -Patient hemoglobin/hematocrit is now 7.7/25.1 and stable  -Platelet count went from 71,000 on admission is now 91,000 -Repeat CBC in AM   Metabolic Acidosis, improving  -Unclear etiology at this point but did have a  Lactic Acidosis on admission at 2.3 -CO2 was 12, chloride level 114, and anion gap was 13; Now it is improving a CO2 is 21, anion gap is 7, and chloride is 112 -IVF now stopped  -Check for etiologies of acidosis including blood work and could not find any  -Repeat CMP in a.m.  Suspected Poisoning? Ruled  Out -Patient's mother adamantly states that the patient's ex-husband is trying to poison the patient and states that she got sick on the night that she saw her husband -We will notify social work for further investigation -In the interim we will check an arsenic level, volatile blood level, salicylate level, as well as ethylene glycol level given the patient still has an acidosis -Patient's arsenic level was 10, ethylene glycol levels less than 5, lead levels 1, mercury level 12.5, acetone level in the blood was negative, ethanol and blood were negative, isopropyl and blood was negative, methanol and blood was negative, and salicylate level was less than 7.0 -All Tox Scree has been Negative so far -We will have Social work look into the issue further but doubt she has been poisoned    Hyponatremia, improved  -Improved as Na+ is now 140 -Will continue monitor and trend -Repeat CMP in AM   Cardiomegaly and vascular congestion -Continue to monitor carefully as patient was getting IV fluid hydration; IVF Hydration has now stopped  -Repeat chest x-ray yesterday showed "Mild stable cardiomegaly with suggestion of mild vascular congestion." -BNP was elevated at 357.7 -Echocardiogram as below -She is +5.1649 Liters since Admission  -Continue to Monitor for S/Sx of Volume Overload and currently not dyspneic   Elevated troponin, flat -No chest pain.  EKG nonacute.  Significance unclear.  Suspect demand ischemia secondary to acute illness. -ECHOCardiogram as below  Elevated D-Dimer -Significance unclear but likely in the setting of Infection.   -She has traveled recently, but it was a short flight on a private jet.  Has no leg edema, no hypoxia, no hemodynamic instability.  VTE does not seem likely to explain her presentation.  Given her thrombocytopenia and low suspicion for PE, will not anticoagulate at this point.   -C/w Bedrest with bathroom privileges. -D-Dimer is trending down and went from  1.93 -> 1.27 -> 2.04; Will not pursue further workup or CTA at this time given lack of Renal Fxn improvement  -Repeat D-Dimer in AM  -Bilateral lower extremity venous Dopplers ordered on admission but canceled as she has no edema, signs or symptoms of DVT  Several indeterminate left hepatic lobe lesions largest of which 3.4 cm seen on ultrasound 06/2014 -Consider outpatient MRI liver protocol -Checked RUQ U/S this visit and showed "Possible mild nodular hepatic contour which may represent cirrhosis. No suspicious focal hepatic lesions. Enlarged portal vein with normal hepatopetal portal vein flow."  Left Rib Pain -Checked a Left Rib X-Ray and showed Rib Fractures. X-Ray was read as "Old lateral sixth rib fracture. Suggestion of acute minimally displaced fractures of the left anterolateral seventh, eighth and ninth ribs. No additional fractures. Remainder the exam is unchanged." -Started Diclofenac 2 grams QID for Pain  Diarrhea, improved -Checking GI Pathogen Panel and pending to result -Do not clinically feel as if this is C Difficile as she is afebrile, has no Leukocytosis and does not have any crampy abdominal pain -Likely Abx Induced from Meropenem -Trial of Loperamide was successful  -Continue to Monitor Carefully   DVT prophylaxis: SCDs given Thrombocytopenia and Anemia  Code Status: FULL CODE  Family Communication:  No family present at bedside  Disposition Plan: Pending further workup and improvement and Case Management assistance with obtaining Antibioitics.   Consultants:   Pharmacy for OPAT orders    Procedures:  ECHOCARDIOGRAM IMPRESSIONS    1. Left ventricular ejection fraction, by visual estimation, is 55 to 60%. The left ventricle has normal function. Normal left ventricular size. There is no left ventricular hypertrophy.  2. Global right ventricle has normal systolic function.The right ventricular size is normal. No increase in right ventricular wall thickness.   3. Left atrial size was severely dilated.  4. Right atrial size was normal.  5. Moderate to severe mitral annular calcification.  6. The mitral valve is normal in structure. There is eccentric mitral regurgitation, probably moderate, posteriorly directed. It may be underestimated.  7. The tricuspid valve is normal in structure. Tricuspid valve regurgitation is mild.  8. The aortic valve is normal in structure. Aortic valve regurgitation was not visualized by color flow Doppler.  9. The pulmonic valve was normal in structure. Pulmonic valve regurgitation is not visualized by color flow Doppler. 10. Moderately elevated pulmonary artery systolic pressure. 11. The inferior vena cava is dilated in size with <50% respiratory variability, suggesting right atrial pressure of 15 mmHg. 12. Consider TEE if the clinical exam suggests severe mitral insufficiency.  FINDINGS  Left Ventricle: Left ventricular ejection fraction, by visual estimation, is 55 to 60%. The left ventricle has normal function. No evidence of left ventricular regional wall motion abnormalities. There is no left ventricular hypertrophy. Normal left  ventricular size. Spectral Doppler shows Left ventricular diastolic parameters were normal pattern of LV diastolic filling. Normal left ventricular filling pressures.  Right Ventricle: The right ventricular size is normal. No increase in right ventricular wall thickness. Global RV systolic function is has normal systolic function. The tricuspid regurgitant velocity is 2.99 m/s, and with an assumed right atrial pressure  of 15 mmHg, the estimated right ventricular systolic pressure is moderately elevated at 50.8 mmHg.  Left Atrium: Left atrial size was severely dilated.  Right Atrium: Right atrial size was normal in size  Pericardium: There is no evidence of pericardial effusion.  Mitral Valve: The mitral valve is normal in structure. Moderate to severe mitral annular calcification.  Mild mitral valve regurgitation, with eccentric laterally directed jet.  Tricuspid Valve: The tricuspid valve is normal in structure. Tricuspid valve regurgitation is mild by color flow Doppler.  Aortic Valve: The aortic valve is normal in structure. Aortic valve regurgitation was not visualized by color flow Doppler.  Pulmonic Valve: The pulmonic valve was normal in structure. Pulmonic valve regurgitation is not visualized by color flow Doppler.  Aorta: The aortic root and ascending aorta are structurally normal, with no evidence of dilitation.  Venous: The inferior vena cava is dilated in size with less than 50% respiratory variability, suggesting right atrial pressure of 15 mmHg.  IAS/Shunts: No atrial level shunt detected by color flow Doppler.     LEFT VENTRICLE PLAX 2D LVIDd:         5.40 cm  Diastology LVIDs:         3.00 cm  LV e' lateral:   10.60 cm/s LV PW:         1.20 cm  LV E/e' lateral: 9.6 LV IVS:        1.10 cm  LV e' medial:    10.40 cm/s LVOT diam:     2.10 cm  LV E/e' medial:  9.8 LV SV:  106 ml LV SV Index:   59.84 LVOT Area:     3.46 cm    RIGHT VENTRICLE RV S prime:     18.50 cm/s TAPSE (M-mode): 3.3 cm  LEFT ATRIUM              Index LA diam:        5.00 cm  2.80 cm/m LA Vol (A2C):   78.9 ml  44.26 ml/m LA Vol (A4C):   117.0 ml 65.63 ml/m LA Biplane Vol: 101.0 ml 56.66 ml/m  AORTIC VALVE LVOT Vmax:   106.00 cm/s LVOT Vmean:  69.500 cm/s LVOT VTI:    0.174 m   AORTA Ao Root diam: 2.90 cm Ao Asc diam:  3.00 cm  MITRAL VALVE                         TRICUSPID VALVE MV Area (PHT): 4.36 cm              TR Peak grad:   35.8 mmHg MV PHT:        50.46 msec            TR Vmax:        299.00 cm/s MV Decel Time: 174 msec MV E velocity: 102.00 cm/s 103 cm/s  SHUNTS MV A velocity: 44.60 cm/s  70.3 cm/s Systemic VTI:  0.17 m MV E/A ratio:  2.29        1.5       Systemic Diam: 2.10 cm   Antimicrobials:  Anti-infectives (From  admission, onward)   Start     Dose/Rate Route Frequency Ordered Stop   10/20/18 1600  meropenem (MERREM) 1 g in sodium chloride 0.9 % 100 mL IVPB     1 g 200 mL/hr over 30 Minutes Intravenous Every 8 hours 10/20/18 1257     10/20/18 0000  ertapenem (INVANZ) IVPB     1 g Intravenous Every 24 hours 10/20/18 1730 10/24/18 2359   10/18/18 1000  meropenem (MERREM) 1 g in sodium chloride 0.9 % 100 mL IVPB  Status:  Discontinued     1 g 200 mL/hr over 30 Minutes Intravenous Every 12 hours 10/18/18 0836 10/20/18 1257   10/17/18 1000  vancomycin (VANCOCIN) IVPB 750 mg/150 ml premix  Status:  Discontinued     750 mg 150 mL/hr over 60 Minutes Intravenous Every 24 hours 10/17/18 0443 10/17/18 0654   10/17/18 1000  cefTRIAXone (ROCEPHIN) 2 g in sodium chloride 0.9 % 100 mL IVPB  Status:  Discontinued     2 g 200 mL/hr over 30 Minutes Intravenous Every 24 hours 10/17/18 0655 10/18/18 0836   10/17/18 0600  ceFEPIme (MAXIPIME) 2 g in sodium chloride 0.9 % 100 mL IVPB  Status:  Discontinued     2 g 200 mL/hr over 30 Minutes Intravenous Every 12 hours 10/17/18 0443 10/17/18 0654   10/16/18 2000  ceFEPIme (MAXIPIME) 2 g in sodium chloride 0.9 % 100 mL IVPB  Status:  Discontinued     2 g 200 mL/hr over 30 Minutes Intravenous  Once 10/16/18 1956 10/17/18 0031   10/16/18 2000  vancomycin (VANCOCIN) IVPB 1000 mg/200 mL premix  Status:  Discontinued     1,000 mg 200 mL/hr over 60 Minutes Intravenous  Once 10/16/18 1956 10/17/18 0101   10/16/18 1230  ceFEPIme (MAXIPIME) 2 g in sodium chloride 0.9 % 100 mL IVPB     2 g 200 mL/hr over 30 Minutes Intravenous  Once 10/16/18 1218 10/16/18 1429   10/16/18 1230  vancomycin (VANCOCIN) IVPB 1000 mg/200 mL premix     1,000 mg 200 mL/hr over 60 Minutes Intravenous  Once 10/16/18 1218 10/16/18 1900     Subjective: Seen and examined at bedside and was wanting to go home.  No chest pain, lightheadedness or dizziness.  Was concerned that her mom continues to believe that  she is still been poisoned.  No nausea or vomiting.  Feels well and back to baseline.  Objective: Vitals:   10/20/18 1950 10/20/18 2107 10/21/18 0516 10/21/18 1250  BP:  121/73 118/65 122/69  Pulse: 80 80 71 82  Resp: 16 16 16  (!) 21  Temp:    98.6 F (37 C)  TempSrc:    Oral  SpO2:  100% 99% 99%  Weight:      Height:        Intake/Output Summary (Last 24 hours) at 10/21/2018 1437 Last data filed at 10/21/2018 0934 Gross per 24 hour  Intake 1725.79 ml  Output --  Net 1725.79 ml   Filed Weights   10/16/18 2200  Weight: 65.8 kg   Examination: Physical Exam:  Constitutional: WN/WD Caucasian female in NAD and appears calm and comfortable Eyes: Lids and conjunctivae normal, sclerae anicteric  ENMT: External Ears, Nose appear normal. Grossly normal hearing. Mucous membranes are moist.  Neck: Appears normal, supple, no cervical masses, normal ROM, no appreciable thyromegaly; no JVD Respiratory: Diminished to auscultation bilaterally, no wheezing, rales, rhonchi or crackles. Normal respiratory effort and patient is not tachypenic. No accessory muscle use. Unlabored breathing  Cardiovascular: RRR, no murmurs / rubs / gallops. S1 and S2 auscultated. Mild extremity edema. 2+ pedal pulses. No carotid bruits.  Abdomen: Soft, non-tender, non-distended. No masses palpated. No appreciable hepatosplenomegaly. Bowel sounds positive x4.  GU: Deferred. Musculoskeletal: No clubbing / cyanosis of digits/nails. No joint deformity upper and lower extremities.  Skin: No rashes, lesions, ulcers on a limited skin evaluation. No induration; Warm and dry.  Neurologic: CN 2-12 grossly intact with no focal deficits. Romberg sign and cerebellar reflexes not assessed.  Psychiatric: Normal judgment and insight. Alert and oriented x 3. Pleasant mood and appropriate affect.   Data Reviewed: I have personally reviewed following labs and imaging studies  CBC: Recent Labs  Lab 10/16/18 1042 10/17/18 0432  10/18/18 0348 10/19/18 0523 10/20/18 0356 10/21/18 0509  WBC 10.9* 6.4 6.8 6.5 6.2 6.0  NEUTROABS 8.6*  --   --  4.5 3.8 3.8  HGB 8.1* 7.6* 7.8* 7.6* 7.7* 7.7*  HCT 24.8* 24.0* 25.3* 23.9* 24.6* 25.1*  MCV 91.9 94.5 95.5 93.0 93.5 94.0  PLT 71* 61* 79* 78* 77* 91*   Basic Metabolic Panel: Recent Labs  Lab 10/17/18 0432 10/18/18 0348 10/19/18 0523 10/20/18 0356 10/21/18 0509  NA 139 139 138 140 140  K 2.9* 3.6 2.9* 3.7 3.6  CL 115* 114* 113* 112* 112*  CO2 17* 12* 18* 20* 21*  GLUCOSE 100* 111* 98 98 89  BUN 15 12 9 9 11   CREATININE 1.33* 1.45* 1.22* 1.20* 1.19*  CALCIUM 8.6* 9.3 8.5* 8.8* 9.1  MG 2.5*  --  2.0 2.0 2.2  PHOS 1.7*  --  1.5* 2.5 2.4*   GFR: Estimated Creatinine Clearance: 49.4 mL/min (A) (by C-G formula based on SCr of 1.19 mg/dL (H)). Liver Function Tests: Recent Labs  Lab 10/17/18 0432 10/18/18 0348 10/19/18 0523 10/20/18 0356 10/21/18 0509  AST 44* 42* 39 40 45*  ALT 27 27  24 25 23   ALKPHOS 112 161* 158* 175* 184*  BILITOT 3.0* 1.7* 2.0* 2.0* 2.4*  PROT 5.2* 5.3* 5.0* 4.8* 5.0*  ALBUMIN 2.5* 2.3* 2.2* 2.2* 2.3*   No results for input(s): LIPASE, AMYLASE in the last 168 hours. No results for input(s): AMMONIA in the last 168 hours. Coagulation Profile: No results for input(s): INR, PROTIME in the last 168 hours. Cardiac Enzymes: No results for input(s): CKTOTAL, CKMB, CKMBINDEX, TROPONINI in the last 168 hours. BNP (last 3 results) No results for input(s): PROBNP in the last 8760 hours. HbA1C: No results for input(s): HGBA1C in the last 72 hours. CBG: No results for input(s): GLUCAP in the last 168 hours. Lipid Profile: No results for input(s): CHOL, HDL, LDLCALC, TRIG, CHOLHDL, LDLDIRECT in the last 72 hours. Thyroid Function Tests: No results for input(s): TSH, T4TOTAL, FREET4, T3FREE, THYROIDAB in the last 72 hours. Anemia Panel: No results for input(s): VITAMINB12, FOLATE, FERRITIN, TIBC, IRON, RETICCTPCT in the last 72  hours. Sepsis Labs: Recent Labs  Lab 10/16/18 1041 10/16/18 1042 10/16/18 1405 10/19/18 0523  PROCALCITON  --  13.13  --  2.03  LATICACIDVEN 3.8*  --  2.2* 1.4    Recent Results (from the past 240 hour(s))  Blood Culture (routine x 2)     Status: Abnormal   Collection Time: 10/16/18 10:45 AM   Specimen: BLOOD RIGHT FOREARM  Result Value Ref Range Status   Specimen Description   Final    BLOOD RIGHT FOREARM Performed at [facility] [address], [place], [place] [address]    Special Requests   Final    BOTTLES DRAWN AEROBIC AND ANAEROBIC Blood Culture adequate volume Performed at [facility] [address], [place], [place] [address]    Culture  Setup Time   Final    GRAM NEGATIVE RODS CRITICAL RESULT CALLED TO, READ BACK BY AND VERIFIED WITH: PHRMD J GIMSLEY @0259  10/17/18 BY S GEZAHEGN IN BOTH AEROBIC AND ANAEROBIC BOTTLES Performed at [facility], [place] [address] [place], [place] [address]    Culture (A)  Final    ESCHERICHIA COLI Confirmed Extended Spectrum Beta-Lactamase Producer (ESBL).  In bloodstream infections from ESBL organisms, carbapenems are preferred over piperacillin/tazobactam. They are shown to have a lower risk of mortality.    Report Status 10/18/2018 FINAL  Final   Organism ID, Bacteria ESCHERICHIA COLI  Final      Susceptibility   Escherichia coli - MIC*    AMPICILLIN >=32 RESISTANT Resistant     CEFAZOLIN >=64 RESISTANT Resistant     CEFEPIME >=64 RESISTANT Resistant     CEFTAZIDIME RESISTANT Resistant     CEFTRIAXONE >=64 RESISTANT Resistant     CIPROFLOXACIN 1 SENSITIVE Sensitive     GENTAMICIN >=16 RESISTANT Resistant     IMIPENEM <=0.25 SENSITIVE Sensitive     TRIMETH/SULFA >=320 RESISTANT Resistant     AMPICILLIN/SULBACTAM >=32 RESISTANT Resistant     PIP/TAZO <=4 SENSITIVE Sensitive     Extended ESBL POSITIVE Resistant     * ESCHERICHIA COLI  Blood Culture ID Panel (Reflexed)     Status:  Abnormal   Collection Time: 10/16/18 10:45 AM  Result Value Ref Range Status   Enterococcus species NOT DETECTED NOT DETECTED Final   Listeria monocytogenes NOT DETECTED NOT DETECTED Final   Staphylococcus species NOT DETECTED NOT DETECTED Final   Staphylococcus aureus (BCID) NOT DETECTED NOT DETECTED Final   Streptococcus species NOT DETECTED NOT DETECTED Final  Streptococcus agalactiae NOT DETECTED NOT DETECTED Final   Streptococcus pneumoniae NOT DETECTED NOT DETECTED Final   Streptococcus pyogenes NOT DETECTED NOT DETECTED Final   Acinetobacter baumannii NOT DETECTED NOT DETECTED Final   Enterobacteriaceae species DETECTED (A) NOT DETECTED Final    Comment: Enterobacteriaceae represent a large family of gram-negative bacteria, not a single organism. CRITICAL RESULT CALLED TO, READ BACK BY AND VERIFIED WITH: PHRMD J GIMSLEY @0259  10/17/18 BY S GEZAHEGN    Enterobacter cloacae complex NOT DETECTED NOT DETECTED Final   Escherichia coli DETECTED (A) NOT DETECTED Final    Comment: CRITICAL RESULT CALLED TO, READ BACK BY AND VERIFIED WITH: PHRMD J GIMSLEY @0259  10/17/18 BY S GEZAHEGN    Klebsiella oxytoca NOT DETECTED NOT DETECTED Final   Klebsiella pneumoniae NOT DETECTED NOT DETECTED Final   Proteus species NOT DETECTED NOT DETECTED Final   Serratia marcescens NOT DETECTED NOT DETECTED Final   Carbapenem resistance NOT DETECTED NOT DETECTED Final   Haemophilus influenzae NOT DETECTED NOT DETECTED Final   Neisseria meningitidis NOT DETECTED NOT DETECTED Final   Pseudomonas aeruginosa NOT DETECTED NOT DETECTED Final   Candida albicans NOT DETECTED NOT DETECTED Final   Candida glabrata NOT DETECTED NOT DETECTED Final   Candida krusei NOT DETECTED NOT DETECTED Final   Candida parapsilosis NOT DETECTED NOT DETECTED Final   Candida tropicalis NOT DETECTED NOT DETECTED Final    Comment: Performed at [facility], [place]. [address] [place], [place] [address]  SARS Coronavirus 2  [facility] order, Performed in [facility] hospital lab) Nasopharyngeal Nasopharyngeal Swab     Status: None   Collection Time: 10/16/18 10:54 AM   Specimen: Nasopharyngeal Swab  Result Value Ref Range Status   SARS Coronavirus 2 NEGATIVE NEGATIVE Final    Comment: (NOTE) If result is NEGATIVE SARS-CoV-2 target nucleic acids are NOT DETECTED. The SARS-CoV-2 RNA is generally detectable in upper and lower  respiratory specimens during the acute phase of infection. The lowest  concentration of SARS-CoV-2 viral copies this assay can detect is 250  copies / mL. A negative result does not preclude SARS-CoV-2 infection  and should not be used as the sole basis for treatment or other  patient management decisions.  A negative result may occur with  improper specimen collection / handling, submission of specimen other  than nasopharyngeal swab, presence of viral mutation(s) within the  areas targeted by this assay, and inadequate number of viral copies  (<250 copies / mL). A negative result must be combined with clinical  observations, patient history, and epidemiological information. If result is POSITIVE SARS-CoV-2 target nucleic acids are DETECTED. The SARS-CoV-2 RNA is generally detectable in upper and lower  respiratory specimens dur ing the acute phase of infection.  Positive  results are indicative of active infection with SARS-CoV-2.  Clinical  correlation with patient history and other diagnostic information is  necessary to determine patient infection status.  Positive results do  not rule out bacterial infection or co-infection with other viruses. If result is PRESUMPTIVE POSTIVE SARS-CoV-2 nucleic acids MAY BE PRESENT.   A presumptive positive result was obtained on the submitted specimen  and confirmed on repeat testing.  While 2019 novel coronavirus  (SARS-CoV-2) nucleic acids may be present in the submitted sample  additional confirmatory testing may be necessary for  epidemiological  and / or clinical management purposes  to differentiate between  SARS-CoV-2 and other Sarbecovirus currently known to infect humans.  If clinically indicated additional testing with an alternate  test  methodology [phone]) is advised. The SARS-CoV-2 RNA is generally  detectable in upper and lower respiratory sp ecimens during the acute  phase of infection. The expected result is Negative. Fact Sheet for Patients:  StrictlyIdeas.no Fact Sheet for Healthcare Providers: BankingDealers.co.za This test is not yet approved or cleared by the [place] FDA and has been authorized for detection and/or diagnosis of SARS-CoV-2 by FDA under an Emergency Use Authorization (EUA).  This EUA will remain in effect (meaning this test can be used) for the duration of the COVID-19 declaration under Section 564(b)(1) of the Act, 21 U.S.C. section 360bbb-3(b)(1), unless the authorization is terminated or revoked sooner. Performed at [facility], [place] [address] [place], [place] [address]   Blood Culture (routine x 2)     Status: Abnormal   Collection Time: 10/16/18 11:45 AM   Specimen: BLOOD LEFT HAND  Result Value Ref Range Status   Specimen Description   Final    BLOOD LEFT HAND Performed at [facility] [address] [place], [place] [address]    Special Requests   Final    BOTTLES DRAWN AEROBIC AND ANAEROBIC Blood Culture adequate volume Performed at [facility] [address], [place], [place] [address]    Culture  Setup Time   Final    GRAM NEGATIVE RODS CRITICAL VALUE NOTED.  VALUE IS CONSISTENT WITH PREVIOUSLY REPORTED AND CALLED VALUE. IN BOTH AEROBIC AND ANAEROBIC BOTTLES    Culture (A)  Final    ESCHERICHIA COLI SUSCEPTIBILITIES PERFORMED ON PREVIOUS CULTURE WITHIN THE LAST 5 DAYS. Performed at [facility], [place] [address] [place], [place] [address]     Report Status 10/19/2018 FINAL  Final  Urine culture     Status: Abnormal   Collection Time: 10/16/18  2:26 PM   Specimen: Urine, Random  Result Value Ref Range Status   Specimen Description   Final    URINE, RANDOM Performed at [facility] [address] [place], [place] [address]    Special Requests   Final    NONE Performed at [facility], [place] [address], [place], [place] [address]    Culture (A)  Final    >=100,000 COLONIES/mL ESCHERICHIA COLI Confirmed Extended Spectrum Beta-Lactamase Producer (ESBL).  In bloodstream infections from ESBL organisms, carbapenems are preferred over piperacillin/tazobactam. They are shown to have a lower risk of mortality.    Report Status 10/18/2018 FINAL  Final   Organism ID, Bacteria ESCHERICHIA COLI (A)  Final      Susceptibility   Escherichia coli - MIC*    AMPICILLIN >=32 RESISTANT Resistant     CEFAZOLIN >=64 RESISTANT Resistant     CEFTRIAXONE >=64 RESISTANT Resistant     CIPROFLOXACIN 1 SENSITIVE Sensitive     GENTAMICIN >=16 RESISTANT Resistant     IMIPENEM <=0.25 SENSITIVE Sensitive     NITROFURANTOIN <=16 SENSITIVE Sensitive     TRIMETH/SULFA >=320 RESISTANT Resistant     AMPICILLIN/SULBACTAM >=32 RESISTANT Resistant     PIP/TAZO <=4 SENSITIVE Sensitive     Extended ESBL POSITIVE Resistant     * >=100,000 COLONIES/mL ESCHERICHIA COLI    Radiology Studies: Dg Ribs Unilateral Left  Result Date: 10/19/2018 CLINICAL DATA:  Left anterior rib pain.  No injury. EXAM: LEFT RIBS - 2 VIEW COMPARISON:  Chest x-ray 10/16/2018 and chest x-ray earlier today. FINDINGS: Old lateral sixth rib fracture. Suggestion of acute minimally displaced fractures of the left anterolateral seventh,  eighth and ninth ribs. No additional fractures. Remainder the exam is unchanged. IMPRESSION: Fractures of the anterolateral left seventh through ninth ribs. Old left sixth rib fracture. Electronically Signed   By: Marin Olp M.D.   On: 10/19/2018 19:40   Dg Chest Port 1 View  Result Date: 10/19/2018 CLINICAL DATA:  Shortness of breath. EXAM: PORTABLE CHEST 1 VIEW COMPARISON:  10/16/2018 and 06/25/2018 FINDINGS: Lungs are adequately inflated with subtle hazy prominence of the perihilar markings suggesting mild vascular congestion. No definite lobar consolidation. No effusion. Mild stable cardiomegaly. Remainder of the exam is unchanged. IMPRESSION: Mild stable cardiomegaly with suggestion of mild vascular congestion. Electronically Signed   By: Marin Olp M.D.   On: 10/19/2018 19:36   [place] Ekg Site Rite  Result Date: 10/20/2018 If Site Rite image not attached, placement could not be confirmed due to current cardiac rhythm.  Scheduled Meds:  diclofenac sodium  2 g Topical QID   folic acid  1 mg Oral Daily   phosphorus  500 mg Oral BID   sodium chloride flush  3 mL Intravenous Q12H   thiamine  100 mg Oral Daily   Continuous Infusions:  meropenem (MERREM) IV 1 g (10/21/18 1407)    LOS: 5 days   Kerney Elbe, DO [facility] PAGER is on [place]  If 7PM-7AM, please contact night-coverage [email or web address] Password [email or web address] 10/21/2018, 2:37 PM

## 2018-10-21 NOTE — Progress Notes (Signed)
PT Note  Patient Details Name: Dana Watkins MRN: [id number] DOB: 11/26/1955      Chart reviewed and spoke with pt at bedside. She states she is ambulating around the room and organizing things , with no difficulty with mobility at all . "feels good". No PT needs at this time, to sign off. Thank you.    Clide Dales 10/21/2018, 3:43 PM  Clide Dales, PT Acute Rehabilitation Services Pager: [phone] Office: [phone] 10/21/2018

## 2018-10-21 NOTE — TOC Progression Note (Signed)
Transition of Care [facility]) - Progression Note    Patient Details  Name: Dana Watkins MRN: [id number] Date of Birth: [birth year]  Transition of Care [facility]) CM/SW Contact  Bingham Millette, Juliann Pulse, RN Phone Number: 10/21/2018, 3:44 PM  Clinical Narrative: May need iv abx for home-[facility] iv abx rep [employer] following await orders. Checking on [facility] agencies who can accept for HHRN-iv abx instruction.           Expected Discharge Plan and Services                                                 Social Determinants of Health (SDOH) Interventions    Readmission Risk Interventions No flowsheet data found.

## 2018-10-21 NOTE — TOC Progression Note (Signed)
Transition of Care [facility]) - Progression Note    Patient Details  Name: Dana Watkins MRN: [id number] Date of Birth: Jan 31, 1955  Transition of Care [facility]) CM/SW Contact  Mariah Gerstenberger, Juliann Pulse, RN Phone Number: 10/21/2018, 7:08 PM  Clinical Narrative:  Notified by [facility] rep Pam-patient doesn't have part d script coverage, out of pocket cost $166/day for abx-patient unable to afford-patient has 3 more days of iv abx to complete as inpatient stay. [place] rep Melissa aware.      Barriers to Discharge: Continued Medical Work up  Expected Discharge Plan and Services                                     HH Arranged: RN, IV Antibiotics HH Agency: Tour manager, [place] ([facility]) Date [place]: 10/21/18 Time [place]: 1611 Representative spoke with at [place]: [place] [employer]   Social Determinants of Health ([place]) Interventions    Readmission Risk Interventions No flowsheet data found.

## 2018-10-22 LAB — GI PATHOGEN PANEL BY PCR, STOOL

## 2018-10-22 LAB — CBC WITH DIFFERENTIAL/PLATELET
Abs Immature Granulocytes: 0.02 10*3/uL (ref 0.00–0.07)
Basophils Absolute: 0 10*3/uL (ref 0.0–0.1)
Basophils Relative: 0 %
Eosinophils Absolute: 0.1 10*3/uL (ref 0.0–0.5)
Eosinophils Relative: 3 %
HCT: 23.7 % — ABNORMAL LOW (ref 36.0–46.0)
Hemoglobin: 7.3 g/dL — ABNORMAL LOW (ref 12.0–15.0)
Immature Granulocytes: 0 %
Lymphocytes Relative: 20 %
Lymphs Abs: 1.1 10*3/uL (ref 0.7–4.0)
MCH: 29.3 pg (ref 26.0–34.0)
MCHC: 30.8 g/dL (ref 30.0–36.0)
MCV: 95.2 fL (ref 80.0–100.0)
Monocytes Absolute: 0.7 10*3/uL (ref 0.1–1.0)
Monocytes Relative: 13 %
Neutro Abs: 3.6 10*3/uL (ref 1.7–7.7)
Neutrophils Relative %: 64 %
Platelets: 92 10*3/uL — ABNORMAL LOW (ref 150–400)
RBC: 2.49 MIL/uL — ABNORMAL LOW (ref 3.87–5.11)
RDW: 18.2 % — ABNORMAL HIGH (ref 11.5–15.5)
WBC: 5.6 10*3/uL (ref 4.0–10.5)
nRBC: 0 % (ref 0.0–0.2)

## 2018-10-22 LAB — COMPREHENSIVE METABOLIC PANEL
ALT: 24 U/L (ref 0–44)
AST: 53 U/L — ABNORMAL HIGH (ref 15–41)
Albumin: 2.2 g/dL — ABNORMAL LOW (ref 3.5–5.0)
Alkaline Phosphatase: 234 U/L — ABNORMAL HIGH (ref 38–126)
Anion gap: 6 (ref 5–15)
BUN: 13 mg/dL (ref 8–23)
CO2: 22 mmol/L (ref 22–32)
Calcium: 9.1 mg/dL (ref 8.9–10.3)
Chloride: 113 mmol/L — ABNORMAL HIGH (ref 98–111)
Creatinine, Ser: 1.34 mg/dL — ABNORMAL HIGH (ref 0.44–1.00)
GFR calc Af Amer: 49 mL/min — ABNORMAL LOW (ref >60)
GFR calc non Af Amer: 42 mL/min — ABNORMAL LOW (ref >60)
Glucose, Bld: 98 mg/dL (ref 70–99)
Potassium: 3.5 mmol/L (ref 3.5–5.1)
Sodium: 141 mmol/L (ref 135–145)
Total Bilirubin: 1.8 mg/dL — ABNORMAL HIGH (ref 0.3–1.2)
Total Protein: 5.1 g/dL — ABNORMAL LOW (ref 6.5–8.1)

## 2018-10-22 LAB — PHOSPHORUS: Phosphorus: 3.5 mg/dL (ref 2.5–4.6)

## 2018-10-22 LAB — MAGNESIUM: Magnesium: 2.2 mg/dL (ref 1.7–2.4)

## 2018-10-22 NOTE — Progress Notes (Signed)
PROGRESS NOTE    Dana Watkins  WPY:[id number] DOB: 1955-03-04 DOA: 10/16/2018 PCP: Lawerance Cruel, MD   Brief Narrative:  HPI per Dr. Murray Hodgkins on 10/16/2018 63 year old woman PMH chronic thrombocytopenia, anemia, CKD stage III PRESENTED to the emergency department 10/5 for fever, cough, nausea, retching, myalgias.  Admitted for febrile illness, suspected false-negative COVID, multiple lab abnormalities.  Patient reports her symptoms began 10/1 with fever, cough, nausea with refractory dry heaves, shortness of breath, no specific aggravating or alleviating factors.  He has not been able to eat or keep anything on her stomach.  Even water comes back up.  Symptoms continue to worsen so she came to the emergency department.  Patient returned October 1 from spending a week in [place].  She flew by private check.  No history of blood clots.  **Interim History Patient was found to have a ESBL E. coli and likely associated bacteremia and her antibiotics were escalated to IV meropenem. Obtained a right upper quadrant ultrasound and evaluate her for liver cirrhosis and showed a single mobile gallstone in the gallbladder with gallbladder wall thickening which was rightly related to her hepatic dysfunction ascites and possible mild nodular hepatic contour which likely represents cirrhosis but no suspicious focal hepatic lesions and large portal vein with normal hepato-pedal portal vein flow.  Lab values are improving slowly.  Patient's mother concerned with husband is "poisoning her".  So we will check further work-up laboratory studies and they have been negative. Patient's Hospitalization has now been complicated by suspected antibiotic induced diarrhea.  Diarrhea has now improved and patient is improving significantly so will place a PICC line and try to transition her for outpatient antibiotic therapy with ertapenem.  PICC line has been ordered and still pending to be done and this was changed  to a Midline given short duration of Abx.  OPAT orders have been placed.  Hopeful discharge in next 24 to 48 hours if Case Management can arrange Antibiotics and Home Health however insurance will not cover her antibiotics and patient cannot for antibiotic so she will stay in the hospital for completion of her meropenem.   Assessment & Plan:   Principal Problem:   SIRS (systemic inflammatory response syndrome) (HCC) Active Problems:   Thrombocytopenia (HCC)   Hypokalemia   CKD (chronic kidney disease), stage III   Acute lower UTI   Elevated troponin   Elevated d-dimer  Sepsis from an ESBL E. coli bacteremia and UTI, improving -In the setting of urinary source, patient reported suprapubic pain and discomfort for several days prior to admission -Urinalysis on admission showed moderate hemoglobin on the urine dipstick, large leukocytes, negative nitrites, many bacteria, 0-5 squamous epithelial cells, and 21-50 WBCs -Blood cultures with gram-negative rods and shows colony-forming units of ESBL E. coli which is sensitive to ciprofloxacin, imipenem, Zosyn, and Macrobid -SARS-CoV-2 testing was negative -Blood cultures x2 also show species with E. coli with sensitivities showing sensitivities to ciprofloxacin, imipenem, and Zosyn -Patient does not have flank tenderness, benign abdominal exam, she has mildly abnormal LFTs however has known diagnosis of cirrhosis and her LFTs are stable from her baseline -IV fluid hydration changed as below -Antibiotics escalated from IV ceftriaxone to IV meropenem for now we will continue IV meropenem for now and will be changed to ertapenem at discharge; PICC Line changed to a Midline  -Lactic acid level on admission was 2.2 and will repeat repeat this morning was one-point -Patient's WBC went from 10.9 is now 6.0 -Procalcitonin level  went from 13.13 is now 2.03 -Continue monitor temperature curve as well as repeat CBC in a.m. -We will treat for 7 days with IV  meropenem at least and complete Abx next week; attempted to transition the patient to ertapenem for outpatient discharge and a midline was placed yesterday however patient cannot afford antibiotics and her insurance does not cover them so she will remain hospitalized until completion of her antibiotic duration course -Sepsis physiology is improving   Acute Kidney Injury on CKD Stage 3? -In the setting of above illness, poor p.o. intake -Improving with hydration, baseline creatinine is around 1.0-1.25 and recently has been ranging from 1.3 upwards and yesterday was 1.45; Today BUN/Cr is now 11/1.19 and slightly worsened to 13/1.34  -Avoid nephrotoxic medications, contrast dyes, as well as hypotension possible -IVF Hydration now stopped  -Procalcitonin level on admission was 13.13 -> 2.03 -Continue to monitor and trend renal function -Repeat CMP in a.m.  Early Liver Cirrhosis noted on Imaging in the setting of fatty liver disease -From hospitalization 4 months ago -Follow-up with gastroenterology, denies excessive alcohol use but not very forthcoming with details, no history of hep C -Checked acute hepatitis panel and was Non-Reactive  -Continue with folic acid and vitamin B1 supplementation -Recommended follow-up with gastroenterology for this as an outpatient but the interim we will obtain a right upper quadrant ultrasound -RUQ U/S showed "Single mobile gallstone with gallbladder wall thickening. The extent of this gallbladder wall thickening is more likely related to hepatic dysfunction/ascites. If there is strong clinical suspicion for acute cholecystitis, consider nuclear medicine study. Possible mild nodular hepatic contour which may represent cirrhosis. No suspicious focal hepatic lesions. Enlarged portal vein with normal hepatopetal portal vein flow." -Continue to monitor carefully as patient is getting IV fluid hydration -Right upper quadrant ultrasound showed only a trace amount of  perihepatic ascites -LFTs showed slight increase in AST as it is 53 now and T Bili trending down now as it went from 2.4 -> 1.8 -She will need outpatient gastroenterology follow-up   Hypokalemia  -Has a chronic hypokalemia and supposedly on KCl 40 mEq twice daily at baseline likely has worsened in the setting of diarrhea and serum bicarbonate infusion -Patient does not know why she has chronic hypokalemia, has not been worked up by nephrology, not on any contributive meds -Magnesium is also low, given IV mag yesterday, will give another dose today and replace potassium IV -K+ is now 3.5 and magnesium level is 2.2 -Continue to monitor and replete as necessary -Repeat CMP in AM   Hypophosphatemia -Patient's Phos Level this AM was 3.5 -Continue to Monitor and Replete as Necessary -Repeat Phos Level in the AM   Abnormal LFTs with mildly elevated AST, Hyperbilirubinemia -This is comparable to recent baseline, could be secondary to underlying cirrhosis as noted on imaging in the past -Continue to monitor and trend hepatic function panel next -Abdominal examination appears benign and T bili is trending down -AST was trending down and and had improved to 39 but it is now 50 -T bili went from 3.0 -> 1.7 -> 2.0 -> 2.0 -> 2.4 -> 1.8 -Continue to monitor and trend -RUQ U/S done and showed likely Cirrhosis  -Repeat CMP in the a.m.  Normocytic Anemia Thrombocytopenia -Suspect this is secondary to splenomegaly, splenic sequestration -Checking anemia panel -Component of hemodilution likely contributing to acute worsening, no symptoms at this time will monitor -Continue to monitor for signs and symptoms of bleeding; currently no overt bleeding noted -Patient hemoglobin/hematocrit  is now 7.3/23.7 -Platelet count went from 71,000 on admission is now 92,000 -Repeat CBC in AM   Metabolic Acidosis, improving  -Unclear etiology at this point but did have a Lactic Acidosis on admission at 2.3 -CO2  was 12, chloride level 114, and anion gap was 13; Now it is improving a CO2 is 22, anion gap is 6, and chloride is 113 -IVF now stopped  -Check for etiologies of acidosis including blood work and could not find any  -Repeat CMP in a.m.  Suspected Poisoning? Ruled Out -Patient's mother adamantly states that the patient's ex-husband is trying to poison the patient and states that she got sick on the night that she saw her husband -We will notify social work for further investigation -In the interim we will check an arsenic level, volatile blood level, salicylate level, as well as ethylene glycol level given the patient still has an acidosis -Patient's arsenic level was 10, ethylene glycol levels less than 5, lead levels 1, mercury level 12.5, acetone level in the blood was negative, ethanol and blood were negative, isopropyl and blood was negative, methanol and blood was negative, and salicylate level was less than 7.0 -All Tox Scree has been Negative so far -We will have Social work look into the issue further but doubt she has been poisoned    Hyponatremia, improved  -Improved as Na+ is now 141 -Will continue monitor and trend -Repeat CMP in AM   Cardiomegaly and vascular congestion -Continue to monitor carefully as patient was getting IV fluid hydration; IVF Hydration has now stopped  -Repeat chest x-ray yesterday showed "Mild stable cardiomegaly with suggestion of mild vascular congestion." -BNP was elevated at 357.7 -Echocardiogram as below -She is +5.3849 Liters since Admission  -Continue to Monitor for S/Sx of Volume Overload and currently not dyspneic   Elevated troponin, flat -No chest pain.  EKG nonacute.  Significance unclear.  Suspect demand ischemia secondary to acute illness. -ECHOCardiogram as below  Elevated D-Dimer -Significance unclear but likely in the setting of Infection.   -She has traveled recently, but it was a short flight on a private jet.  Has no leg edema,  no hypoxia, no hemodynamic instability.  VTE does not seem likely to explain her presentation.  Given her thrombocytopenia and low suspicion for PE, will not anticoagulate at this point.   -C/w Bedrest with bathroom privileges. -D-Dimer is trending down and went from 1.93 -> 1.27 -> 2.04; Will not pursue further workup or CTA at this time given lack of Renal Fxn improvement  -Repeat D-Dimer in AM  -Bilateral lower extremity venous Dopplers ordered on admission but canceled as she has no edema, signs or symptoms of DVT  Several indeterminate left hepatic lobe lesions largest of which 3.4 cm seen on ultrasound 06/2014 -Consider outpatient MRI liver protocol -Checked RUQ U/S this visit and showed "Possible mild nodular hepatic contour which may represent cirrhosis. No suspicious focal hepatic lesions. Enlarged portal vein with normal hepatopetal portal vein flow."  Left Rib Pain 2/2 to Fractures  -Checked a Left Rib X-Ray and showed Rib Fractures. X-Ray was read as "Old lateral sixth rib fracture. Suggestion of acute minimally displaced fractures of the left anterolateral seventh, eighth and ninth ribs. No additional fractures. Remainder the exam is unchanged." -Started Diclofenac 2 grams QID for Pain  Diarrhea, improved -Checking GI Pathogen Panel and pending to result -Do not clinically feel as if this is C Difficile as she is afebrile, has no Leukocytosis and does not have  any crampy abdominal pain -Likely Abx Induced from Meropenem -Trial of Loperamide was successful  -Continue to Monitor Carefully   DVT prophylaxis: SCDs given Thrombocytopenia and Anemia  Code Status: FULL CODE  Family Communication: No family present at bedside  Disposition Plan: Discharge home when antibiotic course is completed.   Consultants:   Pharmacy for OPAT orders    Procedures:  ECHOCARDIOGRAM IMPRESSIONS    1. Left ventricular ejection fraction, by visual estimation, is 55 to 60%. The left ventricle  has normal function. Normal left ventricular size. There is no left ventricular hypertrophy.  2. Global right ventricle has normal systolic function.The right ventricular size is normal. No increase in right ventricular wall thickness.  3. Left atrial size was severely dilated.  4. Right atrial size was normal.  5. Moderate to severe mitral annular calcification.  6. The mitral valve is normal in structure. There is eccentric mitral regurgitation, probably moderate, posteriorly directed. It may be underestimated.  7. The tricuspid valve is normal in structure. Tricuspid valve regurgitation is mild.  8. The aortic valve is normal in structure. Aortic valve regurgitation was not visualized by color flow Doppler.  9. The pulmonic valve was normal in structure. Pulmonic valve regurgitation is not visualized by color flow Doppler. 10. Moderately elevated pulmonary artery systolic pressure. 11. The inferior vena cava is dilated in size with <50% respiratory variability, suggesting right atrial pressure of 15 mmHg. 12. Consider TEE if the clinical exam suggests severe mitral insufficiency.  FINDINGS  Left Ventricle: Left ventricular ejection fraction, by visual estimation, is 55 to 60%. The left ventricle has normal function. No evidence of left ventricular regional wall motion abnormalities. There is no left ventricular hypertrophy. Normal left  ventricular size. Spectral Doppler shows Left ventricular diastolic parameters were normal pattern of LV diastolic filling. Normal left ventricular filling pressures.  Right Ventricle: The right ventricular size is normal. No increase in right ventricular wall thickness. Global RV systolic function is has normal systolic function. The tricuspid regurgitant velocity is 2.99 m/s, and with an assumed right atrial pressure  of 15 mmHg, the estimated right ventricular systolic pressure is moderately elevated at 50.8 mmHg.  Left Atrium: Left atrial size was  severely dilated.  Right Atrium: Right atrial size was normal in size  Pericardium: There is no evidence of pericardial effusion.  Mitral Valve: The mitral valve is normal in structure. Moderate to severe mitral annular calcification. Mild mitral valve regurgitation, with eccentric laterally directed jet.  Tricuspid Valve: The tricuspid valve is normal in structure. Tricuspid valve regurgitation is mild by color flow Doppler.  Aortic Valve: The aortic valve is normal in structure. Aortic valve regurgitation was not visualized by color flow Doppler.  Pulmonic Valve: The pulmonic valve was normal in structure. Pulmonic valve regurgitation is not visualized by color flow Doppler.  Aorta: The aortic root and ascending aorta are structurally normal, with no evidence of dilitation.  Venous: The inferior vena cava is dilated in size with less than 50% respiratory variability, suggesting right atrial pressure of 15 mmHg.  IAS/Shunts: No atrial level shunt detected by color flow Doppler.     LEFT VENTRICLE PLAX 2D LVIDd:         5.40 cm  Diastology LVIDs:         3.00 cm  LV e' lateral:   10.60 cm/s LV PW:         1.20 cm  LV E/e' lateral: 9.6 LV IVS:  1.10 cm  LV e' medial:    10.40 cm/s LVOT diam:     2.10 cm  LV E/e' medial:  9.8 LV SV:         106 ml LV SV Index:   59.84 LVOT Area:     3.46 cm    RIGHT VENTRICLE RV S prime:     18.50 cm/s TAPSE (M-mode): 3.3 cm  LEFT ATRIUM              Index LA diam:        5.00 cm  2.80 cm/m LA Vol (A2C):   78.9 ml  44.26 ml/m LA Vol (A4C):   117.0 ml 65.63 ml/m LA Biplane Vol: 101.0 ml 56.66 ml/m  AORTIC VALVE LVOT Vmax:   106.00 cm/s LVOT Vmean:  69.500 cm/s LVOT VTI:    0.174 m   AORTA Ao Root diam: 2.90 cm Ao Asc diam:  3.00 cm  MITRAL VALVE                         TRICUSPID VALVE MV Area (PHT): 4.36 cm              TR Peak grad:   35.8 mmHg MV PHT:        50.46 msec            TR Vmax:        299.00  cm/s MV Decel Time: 174 msec MV E velocity: 102.00 cm/s 103 cm/s  SHUNTS MV A velocity: 44.60 cm/s  70.3 cm/s Systemic VTI:  0.17 m MV E/A ratio:  2.29        1.5       Systemic Diam: 2.10 cm   Antimicrobials:  Anti-infectives (From admission, onward)   Start     Dose/Rate Route Frequency Ordered Stop   10/20/18 1600  meropenem (MERREM) 1 g in sodium chloride 0.9 % 100 mL IVPB     1 g 200 mL/hr over 30 Minutes Intravenous Every 8 hours 10/20/18 1257     10/20/18 0000  ertapenem (INVANZ) IVPB     1 g Intravenous Every 24 hours 10/20/18 1730 10/24/18 2359   10/18/18 1000  meropenem (MERREM) 1 g in sodium chloride 0.9 % 100 mL IVPB  Status:  Discontinued     1 g 200 mL/hr over 30 Minutes Intravenous Every 12 hours 10/18/18 0836 10/20/18 1257   10/17/18 1000  vancomycin (VANCOCIN) IVPB 750 mg/150 ml premix  Status:  Discontinued     750 mg 150 mL/hr over 60 Minutes Intravenous Every 24 hours 10/17/18 0443 10/17/18 0654   10/17/18 1000  cefTRIAXone (ROCEPHIN) 2 g in sodium chloride 0.9 % 100 mL IVPB  Status:  Discontinued     2 g 200 mL/hr over 30 Minutes Intravenous Every 24 hours 10/17/18 0655 10/18/18 0836   10/17/18 0600  ceFEPIme (MAXIPIME) 2 g in sodium chloride 0.9 % 100 mL IVPB  Status:  Discontinued     2 g 200 mL/hr over 30 Minutes Intravenous Every 12 hours 10/17/18 0443 10/17/18 0654   10/16/18 2000  ceFEPIme (MAXIPIME) 2 g in sodium chloride 0.9 % 100 mL IVPB  Status:  Discontinued     2 g 200 mL/hr over 30 Minutes Intravenous  Once 10/16/18 1956 10/17/18 0031   10/16/18 2000  vancomycin (VANCOCIN) IVPB 1000 mg/200 mL premix  Status:  Discontinued     1,000 mg 200 mL/hr over 60 Minutes Intravenous  Once 10/16/18 1956 10/17/18 0101   10/16/18 1230  ceFEPIme (MAXIPIME) 2 g in sodium chloride 0.9 % 100 mL IVPB     2 g 200 mL/hr over 30 Minutes Intravenous  Once 10/16/18 1218 10/16/18 1429   10/16/18 1230  vancomycin (VANCOCIN) IVPB 1000 mg/200 mL premix     1,000 mg 200  mL/hr over 60 Minutes Intravenous  Once 10/16/18 1218 10/16/18 1900     Subjective: Seen and examined at bedside and understands that she will remain in the hospital to receive her medications to complete antibiotic course.  No nausea or vomiting.  States that she is still on well today and did not use any diclofenac gel in almost 2 days now.  No other concerns or complaints at this time.  Objective: Vitals:   10/21/18 0516 10/21/18 1250 10/21/18 2017 10/22/18 0454  BP: 118/65 122/69 (!) 145/83 136/73  Pulse: 71 82 81 85  Resp: 16 (!) 21 18 14   Temp:  98.6 F (37 C) 99.2 F (37.3 C) 98.8 F (37.1 C)  TempSrc:  Oral Oral Oral  SpO2: 99% 99% 100% 97%  Weight:      Height:        Intake/Output Summary (Last 24 hours) at 10/22/2018 1327 Last data filed at 10/21/2018 2110 Gross per 24 hour  Intake 220 ml  Output --  Net 220 ml   Filed Weights   10/16/18 2200  Weight: 65.8 kg   Examination: Physical Exam:  Constitutional: WN/WD Caucasian female in NAD and appears calm and comfortable Eyes: Lids and conjunctivae normal, sclerae anicteric  ENMT: External Ears, Nose appear normal. Grossly normal hearing. Mucous membranes are moist. Neck: Appears normal, supple, no cervical masses, normal ROM, no appreciable thyromegaly; no JVD Respiratory: Diminished to auscultation bilaterally, no wheezing, rales, rhonchi or crackles. Normal respiratory effort and patient is not tachypenic. No accessory muscle use. Unlabored breathing  Cardiovascular: RRR, no murmurs / rubs / gallops. S1 and S2 auscultated. Trace Edema  Abdomen: Soft, non-tender, non-distended. No masses palpated. No appreciable hepatosplenomegaly. Bowel sounds positive x4.  GU: Deferred. Musculoskeletal: No clubbing / cyanosis of digits/nails. No joint deformity upper and lower extremities.  Skin: No rashes, lesions, ulcers on a limited skin evaluation. No induration; Warm and dry.  Neurologic: CN 2-12 grossly intact with no  focal deficits. Romberg sign and cerebellar reflexes not assessed.  Psychiatric: Normal judgment and insight. Alert and oriented x 3. Pleasant mood and appropriate affect.   Data Reviewed: I have personally reviewed following labs and imaging studies  CBC: Recent Labs  Lab 10/16/18 1042  10/18/18 0348 10/19/18 0523 10/20/18 0356 10/21/18 0509 10/22/18 0335  WBC 10.9*   < > 6.8 6.5 6.2 6.0 5.6  NEUTROABS 8.6*  --   --  4.5 3.8 3.8 3.6  HGB 8.1*   < > 7.8* 7.6* 7.7* 7.7* 7.3*  HCT 24.8*   < > 25.3* 23.9* 24.6* 25.1* 23.7*  MCV 91.9   < > 95.5 93.0 93.5 94.0 95.2  PLT 71*   < > 79* 78* 77* 91* 92*   < > = values in this interval not displayed.   Basic Metabolic Panel: Recent Labs  Lab 10/17/18 0432 10/18/18 0348 10/19/18 0523 10/20/18 0356 10/21/18 0509 10/22/18 0335  NA 139 139 138 140 140 141  K 2.9* 3.6 2.9* 3.7 3.6 3.5  CL 115* 114* 113* 112* 112* 113*  CO2 17* 12* 18* 20* 21* 22  GLUCOSE 100* 111* 98 98 89 98  BUN 15 12 9 9 11 13   CREATININE 1.33* 1.45* 1.22* 1.20* 1.19* 1.34*  CALCIUM 8.6* 9.3 8.5* 8.8* 9.1 9.1  MG 2.5*  --  2.0 2.0 2.2 2.2  PHOS 1.7*  --  1.5* 2.5 2.4* 3.5   GFR: Estimated Creatinine Clearance: 43.9 mL/min (A) (by C-G formula based on SCr of 1.34 mg/dL (H)). Liver Function Tests: Recent Labs  Lab 10/18/18 0348 10/19/18 0523 10/20/18 0356 10/21/18 0509 10/22/18 0335  AST 42* 39 40 45* 53*  ALT 27 24 25 23 24   ALKPHOS 161* 158* 175* 184* 234*  BILITOT 1.7* 2.0* 2.0* 2.4* 1.8*  PROT 5.3* 5.0* 4.8* 5.0* 5.1*  ALBUMIN 2.3* 2.2* 2.2* 2.3* 2.2*   No results for input(s): LIPASE, AMYLASE in the last 168 hours. No results for input(s): AMMONIA in the last 168 hours. Coagulation Profile: No results for input(s): INR, PROTIME in the last 168 hours. Cardiac Enzymes: No results for input(s): CKTOTAL, CKMB, CKMBINDEX, TROPONINI in the last 168 hours. BNP (last 3 results) No results for input(s): PROBNP in the last 8760 hours. HbA1C: No  results for input(s): HGBA1C in the last 72 hours. CBG: No results for input(s): GLUCAP in the last 168 hours. Lipid Profile: No results for input(s): CHOL, HDL, LDLCALC, TRIG, CHOLHDL, LDLDIRECT in the last 72 hours. Thyroid Function Tests: No results for input(s): TSH, T4TOTAL, FREET4, T3FREE, THYROIDAB in the last 72 hours. Anemia Panel: No results for input(s): VITAMINB12, FOLATE, FERRITIN, TIBC, IRON, RETICCTPCT in the last 72 hours. Sepsis Labs: Recent Labs  Lab 10/16/18 1041 10/16/18 1042 10/16/18 1405 10/19/18 0523  PROCALCITON  --  13.13  --  2.03  LATICACIDVEN 3.8*  --  2.2* 1.4    Recent Results (from the past 240 hour(s))  Blood Culture (routine x 2)     Status: Abnormal   Collection Time: 10/16/18 10:45 AM   Specimen: BLOOD RIGHT FOREARM  Result Value Ref Range Status   Specimen Description   Final    BLOOD RIGHT FOREARM Performed at [facility] [address], [place], [place] [address]    Special Requests   Final    BOTTLES DRAWN AEROBIC AND ANAEROBIC Blood Culture adequate volume Performed at [facility] [address] [place],  [address]    Culture  Setup Time   Final    GRAM NEGATIVE RODS CRITICAL RESULT CALLED TO, READ BACK BY AND VERIFIED WITH: PHRMD J GIMSLEY @0259  10/17/18 BY S GEZAHEGN IN BOTH AEROBIC AND ANAEROBIC BOTTLES Performed at [facility], [place] [address], [place],  [address]    Culture (A)  Final    ESCHERICHIA COLI Confirmed Extended Spectrum Beta-Lactamase Producer (ESBL).  In bloodstream infections from ESBL organisms, carbapenems are preferred over piperacillin/tazobactam. They are shown to have a lower risk of mortality.    Report Status 10/18/2018 FINAL  Final   Organism ID, Bacteria ESCHERICHIA COLI  Final      Susceptibility   Escherichia coli - MIC*    AMPICILLIN >=32 RESISTANT Resistant     CEFAZOLIN >=64 RESISTANT Resistant     CEFEPIME >=64 RESISTANT Resistant       CEFTAZIDIME RESISTANT Resistant     CEFTRIAXONE >=64 RESISTANT Resistant     CIPROFLOXACIN 1 SENSITIVE Sensitive     GENTAMICIN >=16 RESISTANT Resistant     IMIPENEM <=0.25 SENSITIVE Sensitive     TRIMETH/SULFA >=320 RESISTANT Resistant     AMPICILLIN/SULBACTAM >=32 RESISTANT Resistant     PIP/TAZO <=  4 SENSITIVE Sensitive     Extended ESBL POSITIVE Resistant     * ESCHERICHIA COLI  Blood Culture ID Panel (Reflexed)     Status: Abnormal   Collection Time: 10/16/18 10:45 AM  Result Value Ref Range Status   Enterococcus species NOT DETECTED NOT DETECTED Final   Listeria monocytogenes NOT DETECTED NOT DETECTED Final   Staphylococcus species NOT DETECTED NOT DETECTED Final   Staphylococcus aureus (BCID) NOT DETECTED NOT DETECTED Final   Streptococcus species NOT DETECTED NOT DETECTED Final   Streptococcus agalactiae NOT DETECTED NOT DETECTED Final   Streptococcus pneumoniae NOT DETECTED NOT DETECTED Final   Streptococcus pyogenes NOT DETECTED NOT DETECTED Final   Acinetobacter baumannii NOT DETECTED NOT DETECTED Final   Enterobacteriaceae species DETECTED (A) NOT DETECTED Final    Comment: Enterobacteriaceae represent a large family of gram-negative bacteria, not a single organism. CRITICAL RESULT CALLED TO, READ BACK BY AND VERIFIED WITH: PHRMD J GIMSLEY @0259  10/17/18 BY S GEZAHEGN    Enterobacter cloacae complex NOT DETECTED NOT DETECTED Final   Escherichia coli DETECTED (A) NOT DETECTED Final    Comment: CRITICAL RESULT CALLED TO, READ BACK BY AND VERIFIED WITH: PHRMD J GIMSLEY @0259  10/17/18 BY S GEZAHEGN    Klebsiella oxytoca NOT DETECTED NOT DETECTED Final   Klebsiella pneumoniae NOT DETECTED NOT DETECTED Final   Proteus species NOT DETECTED NOT DETECTED Final   Serratia marcescens NOT DETECTED NOT DETECTED Final   Carbapenem resistance NOT DETECTED NOT DETECTED Final   Haemophilus influenzae NOT DETECTED NOT DETECTED Final   Neisseria meningitidis NOT DETECTED NOT DETECTED  Final   Pseudomonas aeruginosa NOT DETECTED NOT DETECTED Final   Candida albicans NOT DETECTED NOT DETECTED Final   Candida glabrata NOT DETECTED NOT DETECTED Final   Candida krusei NOT DETECTED NOT DETECTED Final   Candida parapsilosis NOT DETECTED NOT DETECTED Final   Candida tropicalis NOT DETECTED NOT DETECTED Final    Comment: Performed at [facility], [place]. [address], [place], [place] [address]  SARS Coronavirus 2 [facility] order, Performed in [facility] hospital lab) Nasopharyngeal Nasopharyngeal Swab     Status: None   Collection Time: 10/16/18 10:54 AM   Specimen: Nasopharyngeal Swab  Result Value Ref Range Status   SARS Coronavirus 2 NEGATIVE NEGATIVE Final    Comment: (NOTE) If result is NEGATIVE SARS-CoV-2 target nucleic acids are NOT DETECTED. The SARS-CoV-2 RNA is generally detectable in upper and lower  respiratory specimens during the acute phase of infection. The lowest  concentration of SARS-CoV-2 viral copies this assay can detect is 250  copies / mL. A negative result does not preclude SARS-CoV-2 infection  and should not be used as the sole basis for treatment or other  patient management decisions.  A negative result may occur with  improper specimen collection / handling, submission of specimen other  than nasopharyngeal swab, presence of viral mutation(s) within the  areas targeted by this assay, and inadequate number of viral copies  (<250 copies / mL). A negative result must be combined with clinical  observations, patient history, and epidemiological information. If result is POSITIVE SARS-CoV-2 target nucleic acids are DETECTED. The SARS-CoV-2 RNA is generally detectable in upper and lower  respiratory specimens dur ing the acute phase of infection.  Positive  results are indicative of active infection with SARS-CoV-2.  Clinical  correlation with patient history and other diagnostic information is  necessary to determine patient infection status.   Positive results do  not rule out  bacterial infection or co-infection with other viruses. If result is PRESUMPTIVE POSTIVE SARS-CoV-2 nucleic acids MAY BE PRESENT.   A presumptive positive result was obtained on the submitted specimen  and confirmed on repeat testing.  While 2019 novel coronavirus  (SARS-CoV-2) nucleic acids may be present in the submitted sample  additional confirmatory testing may be necessary for epidemiological  and / or clinical management purposes  to differentiate between  SARS-CoV-2 and other Sarbecovirus currently known to infect humans.  If clinically indicated additional testing with an alternate test  methodology [phone]) is advised. The SARS-CoV-2 RNA is generally  detectable in upper and lower respiratory sp ecimens during the acute  phase of infection. The expected result is Negative. Fact Sheet for Patients:  StrictlyIdeas.no Fact Sheet for Healthcare Providers: BankingDealers.co.za This test is not yet approved or cleared by the [place] FDA and has been authorized for detection and/or diagnosis of SARS-CoV-2 by FDA under an Emergency Use Authorization (EUA).  This EUA will remain in effect (meaning this test can be used) for the duration of the COVID-19 declaration under Section 564(b)(1) of the Act, 21 U.S.C. section 360bbb-3(b)(1), unless the authorization is terminated or revoked sooner. Performed at [facility], [place] [address] [place], [place] [address]   Blood Culture (routine x 2)     Status: Abnormal   Collection Time: 10/16/18 11:45 AM   Specimen: BLOOD LEFT HAND  Result Value Ref Range Status   Specimen Description   Final    BLOOD LEFT HAND Performed at [facility] [address], [place], [place] [address]    Special Requests   Final    BOTTLES DRAWN AEROBIC AND ANAEROBIC Blood Culture adequate volume Performed at [facility] [address] [place], [place] [address]    Culture  Setup Time   Final    GRAM NEGATIVE RODS CRITICAL VALUE NOTED.  VALUE IS CONSISTENT WITH PREVIOUSLY REPORTED AND CALLED VALUE. IN BOTH AEROBIC AND ANAEROBIC BOTTLES    Culture (A)  Final    ESCHERICHIA COLI SUSCEPTIBILITIES PERFORMED ON PREVIOUS CULTURE WITHIN THE LAST 5 DAYS. Performed at [facility], [place] [address], [place], [place] [address]    Report Status 10/19/2018 FINAL  Final  Urine culture     Status: Abnormal   Collection Time: 10/16/18  2:26 PM   Specimen: Urine, Random  Result Value Ref Range Status   Specimen Description   Final    URINE, RANDOM Performed at [facility] [address], [place], [place] [address]    Special Requests   Final    NONE Performed at [facility], [place] [address], [place], [place] [address]    Culture (A)  Final    >=100,000 COLONIES/mL ESCHERICHIA COLI Confirmed Extended Spectrum Beta-Lactamase Producer (ESBL).  In bloodstream infections from ESBL organisms, carbapenems are preferred over piperacillin/tazobactam. They are shown to have a lower risk of mortality.    Report Status 10/18/2018 FINAL  Final   Organism ID, Bacteria ESCHERICHIA COLI (A)  Final      Susceptibility   Escherichia coli - MIC*    AMPICILLIN >=32 RESISTANT Resistant     CEFAZOLIN >=64 RESISTANT Resistant     CEFTRIAXONE >=64 RESISTANT Resistant     CIPROFLOXACIN 1 SENSITIVE Sensitive     GENTAMICIN >=16 RESISTANT Resistant     IMIPENEM <=0.25 SENSITIVE Sensitive     NITROFURANTOIN <=16 SENSITIVE Sensitive     TRIMETH/SULFA >=320  RESISTANT Resistant     AMPICILLIN/SULBACTAM >=32 RESISTANT Resistant     PIP/TAZO <=4 SENSITIVE Sensitive     Extended ESBL POSITIVE Resistant     * >=100,000 COLONIES/mL ESCHERICHIA COLI  GI pathogen panel by PCR, stool     Status: None   Collection Time: 10/19/18 11:58 AM   Specimen: Stool  Result Value Ref Range Status    Plesiomonas shigelloides NOT DETECTED NOT DETECTED Final   Yersinia enterocolitica NOT DETECTED NOT DETECTED Final   Vibrio NOT DETECTED NOT DETECTED Final   Enteropathogenic E coli NOT DETECTED NOT DETECTED Final   E coli (ETEC) LT/ST NOT DETECTED NOT DETECTED Final   E coli 0813 by PCR Not applicable NOT DETECTED Final   Cryptosporidium by PCR NOT DETECTED NOT DETECTED Final   Entamoeba histolytica NOT DETECTED NOT DETECTED Final   Adenovirus F 40/41 NOT DETECTED NOT DETECTED Final   Norovirus GI/GII NOT DETECTED NOT DETECTED Final   Sapovirus NOT DETECTED NOT DETECTED Final    Comment: (NOTE) Performed At: [facility] [place], [place] [id number] Rush Farmer MD [id number]    Vibrio cholerae NOT DETECTED NOT DETECTED Final   Campylobacter by PCR NOT DETECTED NOT DETECTED Final   Salmonella by PCR NOT DETECTED NOT DETECTED Final   E coli (STEC) NOT DETECTED NOT DETECTED Final   Enteroaggregative E coli NOT DETECTED NOT DETECTED Final   Shigella by PCR NOT DETECTED NOT DETECTED Final   Cyclospora cayetanensis NOT DETECTED NOT DETECTED Final   Astrovirus NOT DETECTED NOT DETECTED Final   G lamblia by PCR NOT DETECTED NOT DETECTED Final   Rotavirus A by PCR NOT DETECTED NOT DETECTED Final    Radiology Studies: [place] Ekg Site Rite  Result Date: 10/20/2018 If Site Rite image not attached, placement could not be confirmed due to current cardiac rhythm.  Scheduled Meds:  diclofenac sodium  2 g Topical QID   folic acid  1 mg Oral Daily   sodium chloride flush  10-40 mL Intracatheter Q12H   sodium chloride flush  3 mL Intravenous Q12H   thiamine  100 mg Oral Daily   Continuous Infusions:  meropenem (MERREM) IV 1 g (10/22/18 0552)    LOS: 6 days   Kerney Elbe, DO [facility] PAGER is on AMION  If 7PM-7AM, please contact night-coverage [email or web address] Password [email or web address] 10/22/2018, 1:27 PM

## 2018-10-23 DIAGNOSIS — D649 Anemia, unspecified: Secondary | ICD-10-CM

## 2018-10-23 LAB — CBC WITH DIFFERENTIAL/PLATELET
Abs Immature Granulocytes: 0.03 10*3/uL (ref 0.00–0.07)
Basophils Absolute: 0 10*3/uL (ref 0.0–0.1)
Basophils Relative: 0 %
Eosinophils Absolute: 0.1 10*3/uL (ref 0.0–0.5)
Eosinophils Relative: 2 %
HCT: 22.3 % — ABNORMAL LOW (ref 36.0–46.0)
Hemoglobin: 7 g/dL — ABNORMAL LOW (ref 12.0–15.0)
Immature Granulocytes: 1 %
Lymphocytes Relative: 22 %
Lymphs Abs: 1.3 10*3/uL (ref 0.7–4.0)
MCH: 29.9 pg (ref 26.0–34.0)
MCHC: 31.4 g/dL (ref 30.0–36.0)
MCV: 95.3 fL (ref 80.0–100.0)
Monocytes Absolute: 0.6 10*3/uL (ref 0.1–1.0)
Monocytes Relative: 11 %
Neutro Abs: 3.7 10*3/uL (ref 1.7–7.7)
Neutrophils Relative %: 64 %
Platelets: 89 10*3/uL — ABNORMAL LOW (ref 150–400)
RBC: 2.34 MIL/uL — ABNORMAL LOW (ref 3.87–5.11)
RDW: 18.3 % — ABNORMAL HIGH (ref 11.5–15.5)
WBC: 5.9 10*3/uL (ref 4.0–10.5)
nRBC: 0 % (ref 0.0–0.2)

## 2018-10-23 LAB — COMPREHENSIVE METABOLIC PANEL
ALT: 22 U/L (ref 0–44)
AST: 53 U/L — ABNORMAL HIGH (ref 15–41)
Albumin: 2.1 g/dL — ABNORMAL LOW (ref 3.5–5.0)
Alkaline Phosphatase: 205 U/L — ABNORMAL HIGH (ref 38–126)
Anion gap: 5 (ref 5–15)
BUN: 13 mg/dL (ref 8–23)
CO2: 20 mmol/L — ABNORMAL LOW (ref 22–32)
Calcium: 8.8 mg/dL — ABNORMAL LOW (ref 8.9–10.3)
Chloride: 114 mmol/L — ABNORMAL HIGH (ref 98–111)
Creatinine, Ser: 1.2 mg/dL — ABNORMAL HIGH (ref 0.44–1.00)
GFR calc Af Amer: 56 mL/min — ABNORMAL LOW (ref >60)
GFR calc non Af Amer: 48 mL/min — ABNORMAL LOW (ref >60)
Glucose, Bld: 97 mg/dL (ref 70–99)
Potassium: 3.6 mmol/L (ref 3.5–5.1)
Sodium: 139 mmol/L (ref 135–145)
Total Bilirubin: 2.2 mg/dL — ABNORMAL HIGH (ref 0.3–1.2)
Total Protein: 4.9 g/dL — ABNORMAL LOW (ref 6.5–8.1)

## 2018-10-23 LAB — PREPARE RBC (CROSSMATCH)

## 2018-10-23 LAB — ABO/RH: ABO/RH(D): O POS

## 2018-10-23 LAB — PHOSPHORUS: Phosphorus: 2.8 mg/dL (ref 2.5–4.6)

## 2018-10-23 LAB — MAGNESIUM: Magnesium: 2.3 mg/dL (ref 1.7–2.4)

## 2018-10-23 MED ORDER — SODIUM CHLORIDE 0.9% IV SOLUTION
Freq: Once | INTRAVENOUS | Status: AC
  Administered 2018-10-23: 16:00:00 via INTRAVENOUS

## 2018-10-23 NOTE — Progress Notes (Signed)
PHARMACY NOTE -  Antibiotic dosing  Pharmacy has been assisting with dosing of meropenem for ESBL bacteremia.  Dosage remains stable at 1g IV q8 hr and need for further dosage adjustment appears unlikely at present given SCr stable; borderline but given bacteremia will err towards higher dosing  Pharmacy will sign off, following peripherally for culture results or dose adjustments. Please reconsult if a change in clinical status warrants re-evaluation of dosage.  Reuel Boom, PharmD, BCPS [phone] 10/23/2018, 2:32 PM

## 2018-10-23 NOTE — Care Management Important Message (Signed)
Important Message  Patient Details IM Letter given to Cookie McGibboney RN to present to the Patient Name: Dana Watkins MRN: [id number] Date of Birth: [birth year]   Medicare Important Message Given:  Yes     [name] 10/23/2018, 11:20 AM

## 2018-10-23 NOTE — Care Management Important Message (Signed)
Important Message  Patient Details IM Letter given to Cookie McGibboney RN to present to the Patient Name: Dana Watkins MRN: [id number] Date of Birth: [birth year]   Medicare Important Message Given:  Yes     [name] 10/23/2018, 11:21 AM

## 2018-10-23 NOTE — Progress Notes (Signed)
PROGRESS NOTE    Dana Watkins  QPY:[id number] DOB: 06-13-1955 DOA: 10/16/2018 PCP: Lawerance Cruel, MD   Brief Narrative:  HPI per Dr. Murray Hodgkins on 10/16/2018 63 year old woman PMH chronic thrombocytopenia, anemia, CKD stage III PRESENTED to the emergency department 10/5 for fever, cough, nausea, retching, myalgias.  Admitted for febrile illness, suspected false-negative COVID, multiple lab abnormalities.  Patient reports her symptoms began 10/1 with fever, cough, nausea with refractory dry heaves, shortness of breath, no specific aggravating or alleviating factors.  He has not been able to eat or keep anything on her stomach.  Even water comes back up.  Symptoms continue to worsen so she came to the emergency department.  Patient returned October 1 from spending a week in [place].  She flew by private check.  No history of blood clots.  **Interim History Patient was found to have a ESBL E. coli and likely associated bacteremia and her antibiotics were escalated to IV meropenem. Obtained a right upper quadrant ultrasound and evaluate her for liver cirrhosis and showed a single mobile gallstone in the gallbladder with gallbladder wall thickening which was rightly related to her hepatic dysfunction ascites and possible mild nodular hepatic contour which likely represents cirrhosis but no suspicious focal hepatic lesions and large portal vein with normal hepato-pedal portal vein flow.  Lab values are improving slowly.  Patient's mother concerned with husband is "poisoning her".  So we will check further work-up laboratory studies and they have been negative. Patient's Hospitalization has now been complicated by suspected antibiotic induced diarrhea.  Diarrhea has now improved and patient is improving significantly so will place a PICC line and try to transition her for outpatient antibiotic therapy with ertapenem.  PICC line has been ordered and still pending to be done and this was changed  to a Midline given short duration of Abx.  OPAT orders have been placed.  Hopeful discharge in next 24 to 48 hours if Case Management can arrange Antibiotics and Home Health however insurance will not cover her antibiotics and patient cannot for antibiotic so she will stay in the hospital for completion of her meropenem.   Assessment & Plan:   Principal Problem:   SIRS (systemic inflammatory response syndrome) (HCC) Active Problems:   Thrombocytopenia (HCC)   Hypokalemia   CKD (chronic kidney disease), stage III   Acute lower UTI   Elevated troponin   Elevated d-dimer  Sepsis from an ESBL E. coli bacteremia and UTI, improving -In the setting of urinary source, patient reported suprapubic pain and discomfort for several days prior to admission -Urinalysis on admission showed moderate hemoglobin on the urine dipstick, large leukocytes, negative nitrites, many bacteria, 0-5 squamous epithelial cells, and 21-50 WBCs -Blood cultures with gram-negative rods and shows colony-forming units of ESBL E. coli which is sensitive to ciprofloxacin, imipenem, Zosyn, and Macrobid -SARS-CoV-2 testing was negative -Blood cultures x2 also show species with E. coli with sensitivities showing sensitivities to ciprofloxacin, imipenem, and Zosyn -Patient does not have flank tenderness, benign abdominal exam, she has mildly abnormal LFTs however has known diagnosis of cirrhosis and her LFTs are stable from her baseline -IV fluid hydration changed as below -Antibiotics escalated from IV ceftriaxone to IV meropenem for now we will continue IV meropenem for now and will be changed to ertapenem at discharge; PICC Line changed to a Midline  -Lactic acid level on admission was 2.2 and will repeat repeat this morning was one-point -Patient's WBC went from 10.9 is now 5.9 -Procalcitonin level  went from 13.13 is now 2.03 -Continue monitor temperature curve as well as repeat CBC in a.m. -We will treat for 7 days with IV  meropenem at least and complete Abx this week; Attempted to transition the patient to ertapenem for outpatient discharge and a midline was placed however patient cannot afford antibiotics and her insurance does not cover them so she will remain hospitalized until completion of her antibiotic duration course -Sepsis physiology is improving   Acute Kidney Injury on CKD Stage 3? -In the setting of above illness, poor p.o. intake -Improving with hydration, baseline creatinine is around 1.0-1.25 and recently has been ranging from 1.3 upwards and yesterday was 1.45;  BUN/Cr is now 13/1.20 -Avoid nephrotoxic medications, contrast dyes, as well as hypotension possible -IVF Hydration now stopped  -Procalcitonin level on admission was 13.13 -> 2.03 -Continue to monitor and trend renal function -Repeat CMP in a.m.  Early Liver Cirrhosis noted on Imaging in the setting of fatty liver disease -From hospitalization 4 months ago -Follow-up with gastroenterology, denies excessive alcohol use but not very forthcoming with details, no history of hep C -Checked acute hepatitis panel and was Non-Reactive  -Continue with folic acid and vitamin B1 supplementation -Recommended follow-up with gastroenterology for this as an outpatient but the interim we will obtain a right upper quadrant ultrasound -RUQ U/S showed "Single mobile gallstone with gallbladder wall thickening. The extent of this gallbladder wall thickening is more likely related to hepatic dysfunction/ascites. If there is strong clinical suspicion for acute cholecystitis, consider nuclear medicine study. Possible mild nodular hepatic contour which may represent cirrhosis. No suspicious focal hepatic lesions. Enlarged portal vein with normal hepatopetal portal vein flow." -Continue to monitor carefully as patient is getting IV fluid hydration -Right upper quadrant ultrasound showed only a trace amount of perihepatic ascites -LFTs showed slight increase in  AST as it is 53 now and T Bili went from 2.4 -> 1.8 -> 2.2 -She will need outpatient gastroenterology follow-up   Hypokalemia  -Has a chronic hypokalemia and supposedly on KCl 40 mEq twice daily at baseline likely has worsened in the setting of diarrhea and serum bicarbonate infusion -Patient does not know why she has chronic hypokalemia, has not been worked up by nephrology, not on any contributive meds -K+ is now 3.6 and magnesium level is 2.3 -Continue to monitor and replete as necessary -Repeat CMP in AM   Hypophosphatemia -Patient's Phos Level this AM was 2.8 -Continue to Monitor and Replete as Necessary -Repeat Phos Level in the AM   Abnormal LFTs with mildly elevated AST, Hyperbilirubinemia -This is comparable to recent baseline, could be secondary to underlying cirrhosis as noted on imaging in the past -Continue to monitor and trend hepatic function panel next -Abdominal examination appears benign and T bili is trending down -AST was trending down and and had improved to 39 but it is now 84 -T bili went from 3.0 -> 1.7 -> 2.0 -> 2.0 -> 2.4 -> 1.8 -> 2.2 -Continue to monitor and trend -RUQ U/S done and showed likely Cirrhosis  -Repeat CMP in the a.m.  Normocytic Anemia Thrombocytopenia -Suspect this is secondary to splenomegaly, splenic sequestration -Component of hemodilution likely contributing to acute worsening, no symptoms at this time but dropping so will type and screen and transfuse 1 unit of pRBC -Continue to monitor for signs and symptoms of bleeding; currently no overt bleeding noted -Patient hemoglobin/hematocrit is now 7.0/22.3 -Platelet count went from 71,000 on admission is now 89,000 -Repeat CBC in AM  Metabolic Acidosis -Unclear etiology at this point but did have a Lactic Acidosis on admission at 2.3 -CO2 was 12, chloride level 114, and anion gap was 13 on 10/17/2018; Now it is improving a CO2 is 20, anion gap is 5, and chloride is 114 -IVF now  stopped  -Check for etiologies of acidosis including blood work and could not find any  -Repeat CMP in a.m.  Suspected Poisoning? Ruled Out -Patient's mother adamantly states that the patient's ex-husband is trying to poison the patient and states that she got sick on the night that she saw her husband -We will notify social work for further investigation -In the interim we will check an arsenic level, volatile blood level, salicylate level, as well as ethylene glycol level given the patient still has an acidosis -Patient's arsenic level was 10, ethylene glycol levels less than 5, lead levels 1, mercury level 12.5, acetone level in the blood was negative, ethanol and blood were negative, isopropyl and blood was negative, methanol and blood was negative, and salicylate level was less than 7.0 -All Tox Scree has been Negative so far -We will have Social work look into the issue further but doubt she has been poisoned    Hyponatremia, improved  -Improved as Na+ is now 139 -Will continue monitor and trend -Repeat CMP in AM   Cardiomegaly and vascular congestion -Continue to monitor carefully as patient was getting IV fluid hydration; IVF Hydration has now stopped  -Repeat chest x-ray yesterday showed "Mild stable cardiomegaly with suggestion of mild vascular congestion." -BNP was elevated at 357.7 -Echocardiogram as below -She is +5.1049 Liters since Admission  -Continue to Monitor for S/Sx of Volume Overload and currently not dyspneic   Elevated troponin, flat -No chest pain.  EKG nonacute.  Significance unclear.  Suspect demand ischemia secondary to acute illness. -ECHOCardiogram as below  Elevated D-Dimer -Significance unclear but likely in the setting of Infection.   -She has traveled recently, but it was a short flight on a private jet.  Has no leg edema, no hypoxia, no hemodynamic instability.  VTE does not seem likely to explain her presentation.  Given her thrombocytopenia and low  suspicion for PE, will not anticoagulate at this point.   -C/w Bedrest with bathroom privileges. -D-Dimer is trending down and went from 1.93 -> 1.27 -> 2.04; Will not pursue further workup or CTA at this time given lack of Renal Fxn improvement  -Repeat D-Dimer in AM  -Bilateral lower extremity venous Dopplers ordered on admission but canceled as she has no edema, signs or symptoms of DVT  Several indeterminate left hepatic lobe lesions largest of which 3.4 cm seen on ultrasound 06/2014 -Consider outpatient MRI liver protocol -Checked RUQ U/S this visit and showed "Possible mild nodular hepatic contour which may represent cirrhosis. No suspicious focal hepatic lesions. Enlarged portal vein with normal hepatopetal portal vein flow."  Left Rib Pain 2/2 to Fractures  -Checked a Left Rib X-Ray and showed Rib Fractures. X-Ray was read as "Old lateral sixth rib fracture. Suggestion of acute minimally displaced fractures of the left anterolateral seventh, eighth and ninth ribs. No additional fractures. Remainder the exam is unchanged." -Started Diclofenac 2 grams QID for Pain with improvement   Diarrhea, improved -Checking GI Pathogen Panel and pending to result -Do not clinically feel as if this is C Difficile as she is afebrile, has no Leukocytosis and does not have any crampy abdominal pain -Likely Abx Induced from Meropenem -Trial of Loperamide was successful  -Continue  to Monitor Carefully   DVT prophylaxis: SCDs given Thrombocytopenia and Anemia  Code Status: FULL CODE  Family Communication: No family present at bedside  Disposition Plan: Discharge home when antibiotic course is completed and this will be on Wednesday.   Consultants:   Pharmacy for OPAT orders    Procedures:  ECHOCARDIOGRAM IMPRESSIONS    1. Left ventricular ejection fraction, by visual estimation, is 55 to 60%. The left ventricle has normal function. Normal left ventricular size. There is no left ventricular  hypertrophy.  2. Global right ventricle has normal systolic function.The right ventricular size is normal. No increase in right ventricular wall thickness.  3. Left atrial size was severely dilated.  4. Right atrial size was normal.  5. Moderate to severe mitral annular calcification.  6. The mitral valve is normal in structure. There is eccentric mitral regurgitation, probably moderate, posteriorly directed. It may be underestimated.  7. The tricuspid valve is normal in structure. Tricuspid valve regurgitation is mild.  8. The aortic valve is normal in structure. Aortic valve regurgitation was not visualized by color flow Doppler.  9. The pulmonic valve was normal in structure. Pulmonic valve regurgitation is not visualized by color flow Doppler. 10. Moderately elevated pulmonary artery systolic pressure. 11. The inferior vena cava is dilated in size with <50% respiratory variability, suggesting right atrial pressure of 15 mmHg. 12. Consider TEE if the clinical exam suggests severe mitral insufficiency.  FINDINGS  Left Ventricle: Left ventricular ejection fraction, by visual estimation, is 55 to 60%. The left ventricle has normal function. No evidence of left ventricular regional wall motion abnormalities. There is no left ventricular hypertrophy. Normal left  ventricular size. Spectral Doppler shows Left ventricular diastolic parameters were normal pattern of LV diastolic filling. Normal left ventricular filling pressures.  Right Ventricle: The right ventricular size is normal. No increase in right ventricular wall thickness. Global RV systolic function is has normal systolic function. The tricuspid regurgitant velocity is 2.99 m/s, and with an assumed right atrial pressure  of 15 mmHg, the estimated right ventricular systolic pressure is moderately elevated at 50.8 mmHg.  Left Atrium: Left atrial size was severely dilated.  Right Atrium: Right atrial size was normal in  size  Pericardium: There is no evidence of pericardial effusion.  Mitral Valve: The mitral valve is normal in structure. Moderate to severe mitral annular calcification. Mild mitral valve regurgitation, with eccentric laterally directed jet.  Tricuspid Valve: The tricuspid valve is normal in structure. Tricuspid valve regurgitation is mild by color flow Doppler.  Aortic Valve: The aortic valve is normal in structure. Aortic valve regurgitation was not visualized by color flow Doppler.  Pulmonic Valve: The pulmonic valve was normal in structure. Pulmonic valve regurgitation is not visualized by color flow Doppler.  Aorta: The aortic root and ascending aorta are structurally normal, with no evidence of dilitation.  Venous: The inferior vena cava is dilated in size with less than 50% respiratory variability, suggesting right atrial pressure of 15 mmHg.  IAS/Shunts: No atrial level shunt detected by color flow Doppler.     LEFT VENTRICLE PLAX 2D LVIDd:         5.40 cm  Diastology LVIDs:         3.00 cm  LV e' lateral:   10.60 cm/s LV PW:         1.20 cm  LV E/e' lateral: 9.6 LV IVS:        1.10 cm  LV e' medial:    10.40  cm/s LVOT diam:     2.10 cm  LV E/e' medial:  9.8 LV SV:         106 ml LV SV Index:   59.84 LVOT Area:     3.46 cm    RIGHT VENTRICLE RV S prime:     18.50 cm/s TAPSE (M-mode): 3.3 cm  LEFT ATRIUM              Index LA diam:        5.00 cm  2.80 cm/m LA Vol (A2C):   78.9 ml  44.26 ml/m LA Vol (A4C):   117.0 ml 65.63 ml/m LA Biplane Vol: 101.0 ml 56.66 ml/m  AORTIC VALVE LVOT Vmax:   106.00 cm/s LVOT Vmean:  69.500 cm/s LVOT VTI:    0.174 m   AORTA Ao Root diam: 2.90 cm Ao Asc diam:  3.00 cm  MITRAL VALVE                         TRICUSPID VALVE MV Area (PHT): 4.36 cm              TR Peak grad:   35.8 mmHg MV PHT:        50.46 msec            TR Vmax:        299.00 cm/s MV Decel Time: 174 msec MV E velocity: 102.00 cm/s 103 cm/s   SHUNTS MV A velocity: 44.60 cm/s  70.3 cm/s Systemic VTI:  0.17 m MV E/A ratio:  2.29        1.5       Systemic Diam: 2.10 cm   Antimicrobials:  Anti-infectives (From admission, onward)   Start     Dose/Rate Route Frequency Ordered Stop   10/20/18 1600  meropenem (MERREM) 1 g in sodium chloride 0.9 % 100 mL IVPB     1 g 200 mL/hr over 30 Minutes Intravenous Every 8 hours 10/20/18 1257     10/20/18 0000  ertapenem (INVANZ) IVPB     1 g Intravenous Every 24 hours 10/20/18 1730 10/24/18 2359   10/18/18 1000  meropenem (MERREM) 1 g in sodium chloride 0.9 % 100 mL IVPB  Status:  Discontinued     1 g 200 mL/hr over 30 Minutes Intravenous Every 12 hours 10/18/18 0836 10/20/18 1257   10/17/18 1000  vancomycin (VANCOCIN) IVPB 750 mg/150 ml premix  Status:  Discontinued     750 mg 150 mL/hr over 60 Minutes Intravenous Every 24 hours 10/17/18 0443 10/17/18 0654   10/17/18 1000  cefTRIAXone (ROCEPHIN) 2 g in sodium chloride 0.9 % 100 mL IVPB  Status:  Discontinued     2 g 200 mL/hr over 30 Minutes Intravenous Every 24 hours 10/17/18 0655 10/18/18 0836   10/17/18 0600  ceFEPIme (MAXIPIME) 2 g in sodium chloride 0.9 % 100 mL IVPB  Status:  Discontinued     2 g 200 mL/hr over 30 Minutes Intravenous Every 12 hours 10/17/18 0443 10/17/18 0654   10/16/18 2000  ceFEPIme (MAXIPIME) 2 g in sodium chloride 0.9 % 100 mL IVPB  Status:  Discontinued     2 g 200 mL/hr over 30 Minutes Intravenous  Once 10/16/18 1956 10/17/18 0031   10/16/18 2000  vancomycin (VANCOCIN) IVPB 1000 mg/200 mL premix  Status:  Discontinued     1,000 mg 200 mL/hr over 60 Minutes Intravenous  Once 10/16/18 1956 10/17/18 0101   10/16/18 1230  ceFEPIme (MAXIPIME) 2 g in sodium chloride 0.9 % 100 mL IVPB     2 g 200 mL/hr over 30 Minutes Intravenous  Once 10/16/18 1218 10/16/18 1429   10/16/18 1230  vancomycin (VANCOCIN) IVPB 1000 mg/200 mL premix     1,000 mg 200 mL/hr over 60 Minutes Intravenous  Once 10/16/18 1218 10/16/18 1900      Subjective: Seen and examined at bedside and had not CP or SOB but felt a little tired. No nausea or vomiting. States she loves "ice water." No other concerns or complaints at this time.   Objective: Vitals:   10/22/18 0454 10/22/18 2024 10/22/18 2201 10/23/18 0545  BP: 136/73 134/77  134/80  Pulse: 85 84  76  Resp: 14 16  16   Temp: 98.8 F (37.1 C)  98.5 F (36.9 C) 97.9 F (36.6 C)  TempSrc: Oral  Oral Oral  SpO2: 97% 97%  98%  Weight:      Height:        Intake/Output Summary (Last 24 hours) at 10/23/2018 1240 Last data filed at 10/23/2018 0547 Gross per 24 hour  Intake 720 ml  Output 1000 ml  Net -280 ml   Filed Weights   10/16/18 2200  Weight: 65.8 kg   Examination: Physical Exam:  Constitutional: WN/WD Caucasian female in NAD and appears calm and comfortable Eyes: Lids and conjunctivae normal, sclerae anicteric  ENMT: External Ears, Nose appear normal. Grossly normal hearing. Mucous membranes are moist.  Neck: Appears normal, supple, no cervical masses, normal ROM, no appreciable thyromegaly; no JVD Respiratory: Diminished to auscultation bilaterally, no wheezing, rales, rhonchi or crackles. Normal respiratory effort and patient is not tachypenic. No accessory muscle use. Unlabored breathing  Cardiovascular: RRR, no murmurs / rubs / gallops. S1 and S2 auscultated. No extremity edema. Abdomen: Soft, non-tender, mildly distended. Bowel sounds positive x4.  GU: Deferred. Musculoskeletal: No clubbing / cyanosis of digits/nails. No joint deformity upper and lower extremities. Skin: No rashes, lesions, ulcers on a limited skin evaluation. No induration; Warm and dry.  Neurologic: CN 2-12 grossly intact with no focal deficits. Romberg sign and cerebellar reflexes not assessed.  Psychiatric: Normal judgment and insight. Alert and oriented x 3. Pleasant mood and appropriate affect.   Data Reviewed: I have personally reviewed following labs and imaging  studies  CBC: Recent Labs  Lab 10/19/18 0523 10/20/18 0356 10/21/18 0509 10/22/18 0335 10/23/18 0336  WBC 6.5 6.2 6.0 5.6 5.9  NEUTROABS 4.5 3.8 3.8 3.6 3.7  HGB 7.6* 7.7* 7.7* 7.3* 7.0*  HCT 23.9* 24.6* 25.1* 23.7* 22.3*  MCV 93.0 93.5 94.0 95.2 95.3  PLT 78* 77* 91* 92* 89*   Basic Metabolic Panel: Recent Labs  Lab 10/19/18 0523 10/20/18 0356 10/21/18 0509 10/22/18 0335 10/23/18 0336  NA 138 140 140 141 139  K 2.9* 3.7 3.6 3.5 3.6  CL 113* 112* 112* 113* 114*  CO2 18* 20* 21* 22 20*  GLUCOSE 98 98 89 98 97  BUN 9 9 11 13 13   CREATININE 1.22* 1.20* 1.19* 1.34* 1.20*  CALCIUM 8.5* 8.8* 9.1 9.1 8.8*  MG 2.0 2.0 2.2 2.2 2.3  PHOS 1.5* 2.5 2.4* 3.5 2.8   GFR: Estimated Creatinine Clearance: 49 mL/min (A) (by C-G formula based on SCr of 1.2 mg/dL (H)). Liver Function Tests: Recent Labs  Lab 10/19/18 0523 10/20/18 0356 10/21/18 0509 10/22/18 0335 10/23/18 0336  AST 39 40 45* 53* 53*  ALT 24 25 23 24 22   ALKPHOS 158* 175* 184* 234*  205*  BILITOT 2.0* 2.0* 2.4* 1.8* 2.2*  PROT 5.0* 4.8* 5.0* 5.1* 4.9*  ALBUMIN 2.2* 2.2* 2.3* 2.2* 2.1*   No results for input(s): LIPASE, AMYLASE in the last 168 hours. No results for input(s): AMMONIA in the last 168 hours. Coagulation Profile: No results for input(s): INR, PROTIME in the last 168 hours. Cardiac Enzymes: No results for input(s): CKTOTAL, CKMB, CKMBINDEX, TROPONINI in the last 168 hours. BNP (last 3 results) No results for input(s): PROBNP in the last 8760 hours. HbA1C: No results for input(s): HGBA1C in the last 72 hours. CBG: No results for input(s): GLUCAP in the last 168 hours. Lipid Profile: No results for input(s): CHOL, HDL, LDLCALC, TRIG, CHOLHDL, LDLDIRECT in the last 72 hours. Thyroid Function Tests: No results for input(s): TSH, T4TOTAL, FREET4, T3FREE, THYROIDAB in the last 72 hours. Anemia Panel: No results for input(s): VITAMINB12, FOLATE, FERRITIN, TIBC, IRON, RETICCTPCT in the last 72  hours. Sepsis Labs: Recent Labs  Lab 10/16/18 1405 10/19/18 0523  PROCALCITON  --  2.03  LATICACIDVEN 2.2* 1.4    Recent Results (from the past 240 hour(s))  Blood Culture (routine x 2)     Status: Abnormal   Collection Time: 10/16/18 10:45 AM   Specimen: BLOOD RIGHT FOREARM  Result Value Ref Range Status   Specimen Description   Final    BLOOD RIGHT FOREARM Performed at [facility] [address], [place], [place] [address]    Special Requests   Final    BOTTLES DRAWN AEROBIC AND ANAEROBIC Blood Culture adequate volume Performed at [facility] [address], [place], [place] [address]    Culture  Setup Time   Final    GRAM NEGATIVE RODS CRITICAL RESULT CALLED TO, READ BACK BY AND VERIFIED WITH: PHRMD J GIMSLEY @0259  10/17/18 BY S GEZAHEGN IN BOTH AEROBIC AND ANAEROBIC BOTTLES Performed at [facility], [place] [address] [place], [place] [address]    Culture (A)  Final    ESCHERICHIA COLI Confirmed Extended Spectrum Beta-Lactamase Producer (ESBL).  In bloodstream infections from ESBL organisms, carbapenems are preferred over piperacillin/tazobactam. They are shown to have a lower risk of mortality.    Report Status 10/18/2018 FINAL  Final   Organism ID, Bacteria ESCHERICHIA COLI  Final      Susceptibility   Escherichia coli - MIC*    AMPICILLIN >=32 RESISTANT Resistant     CEFAZOLIN >=64 RESISTANT Resistant     CEFEPIME >=64 RESISTANT Resistant     CEFTAZIDIME RESISTANT Resistant     CEFTRIAXONE >=64 RESISTANT Resistant     CIPROFLOXACIN 1 SENSITIVE Sensitive     GENTAMICIN >=16 RESISTANT Resistant     IMIPENEM <=0.25 SENSITIVE Sensitive     TRIMETH/SULFA >=320 RESISTANT Resistant     AMPICILLIN/SULBACTAM >=32 RESISTANT Resistant     PIP/TAZO <=4 SENSITIVE Sensitive     Extended ESBL POSITIVE Resistant     * ESCHERICHIA COLI  Blood Culture ID Panel (Reflexed)     Status: Abnormal   Collection Time: 10/16/18 10:45 AM   Result Value Ref Range Status   Enterococcus species NOT DETECTED NOT DETECTED Final   Listeria monocytogenes NOT DETECTED NOT DETECTED Final   Staphylococcus species NOT DETECTED NOT DETECTED Final   Staphylococcus aureus (BCID) NOT DETECTED NOT DETECTED Final   Streptococcus species NOT DETECTED NOT DETECTED Final   Streptococcus agalactiae NOT DETECTED NOT DETECTED Final   Streptococcus pneumoniae NOT DETECTED NOT DETECTED Final   Streptococcus pyogenes NOT  DETECTED NOT DETECTED Final   Acinetobacter baumannii NOT DETECTED NOT DETECTED Final   Enterobacteriaceae species DETECTED (A) NOT DETECTED Final    Comment: Enterobacteriaceae represent a large family of gram-negative bacteria, not a single organism. CRITICAL RESULT CALLED TO, READ BACK BY AND VERIFIED WITH: PHRMD J GIMSLEY @0259  10/17/18 BY S GEZAHEGN    Enterobacter cloacae complex NOT DETECTED NOT DETECTED Final   Escherichia coli DETECTED (A) NOT DETECTED Final    Comment: CRITICAL RESULT CALLED TO, READ BACK BY AND VERIFIED WITH: PHRMD J GIMSLEY @0259  10/17/18 BY S GEZAHEGN    Klebsiella oxytoca NOT DETECTED NOT DETECTED Final   Klebsiella pneumoniae NOT DETECTED NOT DETECTED Final   Proteus species NOT DETECTED NOT DETECTED Final   Serratia marcescens NOT DETECTED NOT DETECTED Final   Carbapenem resistance NOT DETECTED NOT DETECTED Final   Haemophilus influenzae NOT DETECTED NOT DETECTED Final   Neisseria meningitidis NOT DETECTED NOT DETECTED Final   Pseudomonas aeruginosa NOT DETECTED NOT DETECTED Final   Candida albicans NOT DETECTED NOT DETECTED Final   Candida glabrata NOT DETECTED NOT DETECTED Final   Candida krusei NOT DETECTED NOT DETECTED Final   Candida parapsilosis NOT DETECTED NOT DETECTED Final   Candida tropicalis NOT DETECTED NOT DETECTED Final    Comment: Performed at [facility], [place]. [address] [place], [place] [address]  SARS Coronavirus 2 [facility] order, Performed in [facility] hospital  lab) Nasopharyngeal Nasopharyngeal Swab     Status: None   Collection Time: 10/16/18 10:54 AM   Specimen: Nasopharyngeal Swab  Result Value Ref Range Status   SARS Coronavirus 2 NEGATIVE NEGATIVE Final    Comment: (NOTE) If result is NEGATIVE SARS-CoV-2 target nucleic acids are NOT DETECTED. The SARS-CoV-2 RNA is generally detectable in upper and lower  respiratory specimens during the acute phase of infection. The lowest  concentration of SARS-CoV-2 viral copies this assay can detect is 250  copies / mL. A negative result does not preclude SARS-CoV-2 infection  and should not be used as the sole basis for treatment or other  patient management decisions.  A negative result may occur with  improper specimen collection / handling, submission of specimen other  than nasopharyngeal swab, presence of viral mutation(s) within the  areas targeted by this assay, and inadequate number of viral copies  (<250 copies / mL). A negative result must be combined with clinical  observations, patient history, and epidemiological information. If result is POSITIVE SARS-CoV-2 target nucleic acids are DETECTED. The SARS-CoV-2 RNA is generally detectable in upper and lower  respiratory specimens dur ing the acute phase of infection.  Positive  results are indicative of active infection with SARS-CoV-2.  Clinical  correlation with patient history and other diagnostic information is  necessary to determine patient infection status.  Positive results do  not rule out bacterial infection or co-infection with other viruses. If result is PRESUMPTIVE POSTIVE SARS-CoV-2 nucleic acids MAY BE PRESENT.   A presumptive positive result was obtained on the submitted specimen  and confirmed on repeat testing.  While 2019 novel coronavirus  (SARS-CoV-2) nucleic acids may be present in the submitted sample  additional confirmatory testing may be necessary for epidemiological  and / or clinical management purposes  to  differentiate between  SARS-CoV-2 and other Sarbecovirus currently known to infect humans.  If clinically indicated additional testing with an alternate test  methodology [phone]) is advised. The SARS-CoV-2 RNA is generally  detectable in upper and lower respiratory sp ecimens during  the acute  phase of infection. The expected result is Negative. Fact Sheet for Patients:  StrictlyIdeas.no Fact Sheet for Healthcare Providers: BankingDealers.co.za This test is not yet approved or cleared by the [place] FDA and has been authorized for detection and/or diagnosis of SARS-CoV-2 by FDA under an Emergency Use Authorization (EUA).  This EUA will remain in effect (meaning this test can be used) for the duration of the COVID-19 declaration under Section 564(b)(1) of the Act, 21 U.S.C. section 360bbb-3(b)(1), unless the authorization is terminated or revoked sooner. Performed at [facility], [place] [address], [place], [place] [address]   Blood Culture (routine x 2)     Status: Abnormal   Collection Time: 10/16/18 11:45 AM   Specimen: BLOOD LEFT HAND  Result Value Ref Range Status   Specimen Description   Final    BLOOD LEFT HAND Performed at [place] [address] [place], [place] [address]    Special Requests   Final    BOTTLES DRAWN AEROBIC AND ANAEROBIC Blood Culture adequate volume Performed at [place] [address], [place], [place] [address]    Culture  Setup Time   Final    GRAM NEGATIVE RODS CRITICAL VALUE NOTED.  VALUE IS CONSISTENT WITH PREVIOUSLY REPORTED AND CALLED VALUE. IN BOTH AEROBIC AND ANAEROBIC BOTTLES    Culture (A)  Final    ESCHERICHIA COLI SUSCEPTIBILITIES PERFORMED ON PREVIOUS CULTURE WITHIN THE LAST 5 DAYS. Performed at [facility], [place] [address] [place], [place] [address]    Report Status 10/19/2018 FINAL  Final  Urine culture      Status: Abnormal   Collection Time: 10/16/18  2:26 PM   Specimen: Urine, Random  Result Value Ref Range Status   Specimen Description   Final    URINE, RANDOM Performed at [place] [address], [place], [place] [address]    Special Requests   Final    NONE Performed at [facility], [place] [address], [place], [place] [address]    Culture (A)  Final    >=100,000 COLONIES/mL ESCHERICHIA COLI Confirmed Extended Spectrum Beta-Lactamase Producer (ESBL).  In bloodstream infections from ESBL organisms, carbapenems are preferred over piperacillin/tazobactam. They are shown to have a lower risk of mortality.    Report Status 10/18/2018 FINAL  Final   Organism ID, Bacteria ESCHERICHIA COLI (A)  Final      Susceptibility   Escherichia coli - MIC*    AMPICILLIN >=32 RESISTANT Resistant     CEFAZOLIN >=64 RESISTANT Resistant     CEFTRIAXONE >=64 RESISTANT Resistant     CIPROFLOXACIN 1 SENSITIVE Sensitive     GENTAMICIN >=16 RESISTANT Resistant     IMIPENEM <=0.25 SENSITIVE Sensitive     NITROFURANTOIN <=16 SENSITIVE Sensitive     TRIMETH/SULFA >=320 RESISTANT Resistant     AMPICILLIN/SULBACTAM >=32 RESISTANT Resistant     PIP/TAZO <=4 SENSITIVE Sensitive     Extended ESBL POSITIVE Resistant     * >=100,000 COLONIES/mL ESCHERICHIA COLI  GI pathogen panel by PCR, stool     Status: None   Collection Time: 10/19/18 11:58 AM   Specimen: Stool  Result Value Ref Range Status   Plesiomonas shigelloides NOT DETECTED NOT DETECTED Final   Yersinia enterocolitica NOT DETECTED NOT DETECTED Final   Vibrio NOT DETECTED NOT DETECTED Final   Enteropathogenic E coli NOT DETECTED NOT DETECTED Final   E coli (ETEC) LT/ST NOT DETECTED NOT DETECTED Final  E coli 8088 by PCR Not applicable NOT DETECTED Final   Cryptosporidium by PCR NOT DETECTED NOT DETECTED Final   Entamoeba histolytica NOT DETECTED NOT DETECTED Final   Adenovirus F 40/41 NOT DETECTED NOT  DETECTED Final   Norovirus GI/GII NOT DETECTED NOT DETECTED Final   Sapovirus NOT DETECTED NOT DETECTED Final    Comment: (NOTE) Performed At: [facility] [place], [place] [id number] Rush Farmer MD OP:[id number]    Vibrio cholerae NOT DETECTED NOT DETECTED Final   Campylobacter by PCR NOT DETECTED NOT DETECTED Final   Salmonella by PCR NOT DETECTED NOT DETECTED Final   E coli (STEC) NOT DETECTED NOT DETECTED Final   Enteroaggregative E coli NOT DETECTED NOT DETECTED Final   Shigella by PCR NOT DETECTED NOT DETECTED Final   Cyclospora cayetanensis NOT DETECTED NOT DETECTED Final   Astrovirus NOT DETECTED NOT DETECTED Final   G lamblia by PCR NOT DETECTED NOT DETECTED Final   Rotavirus A by PCR NOT DETECTED NOT DETECTED Final    Radiology Studies: No results found. Scheduled Meds:  sodium chloride   Intravenous Once   diclofenac sodium  2 g Topical QID   folic acid  1 mg Oral Daily   sodium chloride flush  10-40 mL Intracatheter Q12H   sodium chloride flush  3 mL Intravenous Q12H   thiamine  100 mg Oral Daily   Continuous Infusions:  meropenem (MERREM) IV 1 g (10/23/18 0650)    LOS: 7 days   Kerney Elbe, DO [facility] PAGER is on AMION  If 7PM-7AM, please contact night-coverage [email or web address] Password [email or web address] 10/23/2018, 12:40 PM

## 2018-10-24 DIAGNOSIS — D649 Anemia, unspecified: Secondary | ICD-10-CM

## 2018-10-24 DIAGNOSIS — A498 Other bacterial infections of unspecified site: Secondary | ICD-10-CM

## 2018-10-24 LAB — TYPE AND SCREEN
ABO/RH(D): O POS
Antibody Screen: NEGATIVE
Unit division: 0

## 2018-10-24 LAB — CBC WITH DIFFERENTIAL/PLATELET
Abs Immature Granulocytes: 0.03 10*3/uL (ref 0.00–0.07)
Basophils Absolute: 0 10*3/uL (ref 0.0–0.1)
Basophils Relative: 1 %
Eosinophils Absolute: 0.1 10*3/uL (ref 0.0–0.5)
Eosinophils Relative: 2 %
HCT: 25.1 % — ABNORMAL LOW (ref 36.0–46.0)
Hemoglobin: 7.8 g/dL — ABNORMAL LOW (ref 12.0–15.0)
Immature Granulocytes: 1 %
Lymphocytes Relative: 22 %
Lymphs Abs: 1.2 10*3/uL (ref 0.7–4.0)
MCH: 29.5 pg (ref 26.0–34.0)
MCHC: 31.1 g/dL (ref 30.0–36.0)
MCV: 95.1 fL (ref 80.0–100.0)
Monocytes Absolute: 0.6 10*3/uL (ref 0.1–1.0)
Monocytes Relative: 12 %
Neutro Abs: 3.4 10*3/uL (ref 1.7–7.7)
Neutrophils Relative %: 62 %
Platelets: 84 10*3/uL — ABNORMAL LOW (ref 150–400)
RBC: 2.64 MIL/uL — ABNORMAL LOW (ref 3.87–5.11)
RDW: 18.1 % — ABNORMAL HIGH (ref 11.5–15.5)
WBC: 5.3 10*3/uL (ref 4.0–10.5)
nRBC: 0 % (ref 0.0–0.2)

## 2018-10-24 LAB — BPAM RBC
Blood Product Expiration Date: 202011122359
ISSUE DATE / TIME: 202010121616
Unit Type and Rh: 5100

## 2018-10-24 LAB — COMPREHENSIVE METABOLIC PANEL
ALT: 24 U/L (ref 0–44)
AST: 58 U/L — ABNORMAL HIGH (ref 15–41)
Albumin: 2.1 g/dL — ABNORMAL LOW (ref 3.5–5.0)
Alkaline Phosphatase: 205 U/L — ABNORMAL HIGH (ref 38–126)
Anion gap: 7 (ref 5–15)
BUN: 13 mg/dL (ref 8–23)
CO2: 19 mmol/L — ABNORMAL LOW (ref 22–32)
Calcium: 8.6 mg/dL — ABNORMAL LOW (ref 8.9–10.3)
Chloride: 114 mmol/L — ABNORMAL HIGH (ref 98–111)
Creatinine, Ser: 1.07 mg/dL — ABNORMAL HIGH (ref 0.44–1.00)
GFR calc Af Amer: >60 mL/min (ref >60)
GFR calc non Af Amer: 56 mL/min — ABNORMAL LOW (ref >60)
Glucose, Bld: 88 mg/dL (ref 70–99)
Potassium: 3.4 mmol/L — ABNORMAL LOW (ref 3.5–5.1)
Sodium: 140 mmol/L (ref 135–145)
Total Bilirubin: 2.9 mg/dL — ABNORMAL HIGH (ref 0.3–1.2)
Total Protein: 5.1 g/dL — ABNORMAL LOW (ref 6.5–8.1)

## 2018-10-24 LAB — PHOSPHORUS: Phosphorus: 2.4 mg/dL — ABNORMAL LOW (ref 2.5–4.6)

## 2018-10-24 LAB — MAGNESIUM: Magnesium: 2.3 mg/dL (ref 1.7–2.4)

## 2018-10-24 MED ORDER — ONDANSETRON HCL 4 MG PO TABS: 4.0000 mg | ORAL_TABLET | Freq: Four times a day (QID) | ORAL | 0 refills | Status: DC | PRN

## 2018-10-24 MED ORDER — SODIUM CHLORIDE 0.9 % IV SOLN
1.0000 g | Freq: Once | INTRAVENOUS | Status: AC
  Administered 2018-10-24: 1000 mg via INTRAVENOUS
  Filled 2018-10-24: qty 1

## 2018-10-24 MED ORDER — POTASSIUM CHLORIDE CRYS ER 20 MEQ PO TBCR
40.0000 meq | EXTENDED_RELEASE_TABLET | Freq: Once | ORAL | Status: AC
  Administered 2018-10-24: 40 meq via ORAL
  Filled 2018-10-24: qty 2

## 2018-10-24 MED ORDER — DICLOFENAC SODIUM 1 % TD GEL: 2.0000 g | Freq: Four times a day (QID) | TRANSDERMAL | 0 refills | Status: DC

## 2018-10-24 MED ORDER — K PHOS MONO-SOD PHOS DI & MONO 155-852-130 MG PO TABS
500.0000 mg | ORAL_TABLET | Freq: Once | ORAL | Status: AC
  Administered 2018-10-24: 500 mg via ORAL
  Filled 2018-10-24: qty 2

## 2018-10-24 NOTE — Discharge Summary (Signed)
Physician Discharge Summary  Dana Watkins HDQ:[id number] DOB: [birth year] DOA: 10/16/2018  PCP: Lawerance Cruel, MD  Admit date: 10/16/2018 Discharge date: 10/24/2018  Admitted From: Home Disposition: Home as no longer requiring [facility] given completion of Abx   Recommendations for Outpatient Follow-up:  1. Follow up with PCP in 1-2 weeks 2. Follow up with Gastroenterology in the outpatient setting within 1-2 weeks. [facility] will call you with appointment and it was made for 10/27/2026 8:45 AM 3. Follow-up for potential MRI of the liver in the outpatient setting 4. Please obtain CMP/CBC, Mag, Phos in one week 5. Please follow up on the following pending results:  Home Health: Initially Yes, but now will not need Equipment/Devices: None    Discharge Condition: Stable CODE STATUS: FULL CODE  Diet recommendation:   Brief/Interim Summary: HPI per Dr. Murray Hodgkins on 10/16/2018 63 year old woman PMH chronic thrombocytopenia, anemia, CKD stage III PRESENTED to the emergency department 10/5 for fever, cough, nausea, retching, myalgias. Admitted for febrile illness, suspected false-negative COVID, multiple lab abnormalities.  Patient reports her symptoms began 10/1 with fever, cough, nausea with refractory dry heaves, shortness of breath, no specific aggravating or alleviating factors. He has not been able to eat or keep anything on her stomach. Even water comes back up. Symptoms continue to worsen so she came to the emergency department. Patient returned October 1 from spending a week in [place]. She flew by private check. No history of blood clots.  **Interim History Patient was found to have a ESBL E. coli and likely associated bacteremia and her antibiotics were escalated to IV meropenem. Obtained a right upper quadrant ultrasound and evaluate her for liver cirrhosis and showed a single mobile gallstone in the gallbladder with gallbladder wall thickening which  was rightly related to her hepatic dysfunction ascites and possible mild nodular hepatic contour which likely represents cirrhosis but no suspicious focal hepatic lesions and large portal vein with normal hepato-pedal portal vein flow.  Lab values are improving slowly.  Patient's mother concerned with husband is "poisoning her".  So we will check further work-up laboratory studies and they have been negative. Patient's Hospitalization has now been complicated by suspected antibiotic induced diarrhea.  Diarrhea has now improved and patient is improving significantly so will place a PICC line and try to transition her for outpatient antibiotic therapy with ertapenem.  PICC line has been ordered and still pending to be done and this was changed to a Midline given short duration of Abx.  OPAT orders have been placed.  Unfortunately patient could not afford her antibiotics and was kept in the hospital until completion and was changed to a ertapenem today to complete her antibiotic course.  She is deemed stable for discharge and will need to follow-up with PCP as well as gastroenterology in outpatient setting.  Discharge Diagnoses:  Principal Problem:   SIRS (systemic inflammatory response syndrome) (HCC) Active Problems:   Thrombocytopenia (HCC)   Hypokalemia   CKD (chronic kidney disease), stage III   Acute lower UTI   Elevated troponin   Elevated d-dimer   Normocytic anemia  Sepsis from an ESBL E. coli bacteremia and UTI, improving -In the setting of urinary source, patient reported suprapubic pain and discomfort for several days prior to admission -Urinalysis on admission showed moderate hemoglobin on the urine dipstick, large leukocytes, negative nitrites, many bacteria, 0-5 squamous epithelial cells, and 21-50 WBCs -Blood cultures with gram-negative rods and shows colony-forming units of ESBL E. coli which is  sensitive to ciprofloxacin, imipenem, Zosyn, and Macrobid -SARS-CoV-2 testing was  negative -Blood cultures x2 also show species with E. coli with sensitivities showing sensitivities to ciprofloxacin, imipenem, and Zosyn -Patient does not have flank tenderness, benign abdominal exam, she has mildly abnormal LFTs however has known diagnosis of cirrhosis and her LFTs are stable from her baseline -IV fluid hydration changed as below -Antibiotics escalated from IV ceftriaxone to IV meropenem for now we will continue IV meropenem for now and will be changed to ertapenem at discharge; PICC Line changed to a Midline  -Lactic acid level on admission was 2.2 and will repeat repeat this morning was one-point -Patient's WBC went from 10.9 is now 5.3 -Procalcitonin level went from 13.13 is now 2.03 -Continue monitor temperature curve as well as repeat CBC in a.m. -We will treat for 7 days with IV meropenem at least and complete Abx this week; Attempted to transition the patient to ertapenem for outpatient discharge and a midline was placed however patient cannot afford antibiotics and her insurance does not cover them so she will remain hospitalized until completion of her antibiotic duration course she received her last dose of antibiotics today as it was right up and him -Sepsis physiology is improving   Acute Kidney Injury on CKD Stage 3? -In the setting of above illness, poor p.o. intake -Improving with hydration, baseline creatinine is around 1.0-1.25 and recently has been ranging from 1.3 upwards and yesterday was 1.45;  BUN/Cr is now  13/1.07 -Avoid nephrotoxic medications, contrast dyes, as well as hypotension possible -IVF Hydration now stopped  -Procalcitonin level on admission was 13.13 -> 2.03 -Continue to monitor and trend renal function -Repeat CMP in a.m.  Early Liver Cirrhosis noted onImaging in the setting of fatty liver disease -From hospitalization 4 months ago -Follow-up with gastroenterology, denies excessive alcohol use but not very forthcoming with details, no  history of hep C -Checked acute hepatitis panel and was Non-Reactive  -Continue with folic acid and vitamin B1 supplementation -Recommended follow-up with gastroenterology for this as an outpatient but the interim we will obtain a right upper quadrant ultrasound -RUQ U/S showed "Single mobile gallstone with gallbladder wall thickening. The extent of this gallbladder wall thickening is more likely related to hepatic dysfunction/ascites. If there is strong clinical suspicion for acute cholecystitis, consider nuclear medicine study. Possible mild nodular hepatic contour which may represent cirrhosis. No suspicious focal hepatic lesions. Enlarged portal vein with normal hepatopetal portal vein flow." -Continue to monitor carefully as patient is getting IV fluid hydration -Right upper quadrant ultrasound showed only a trace amount of perihepatic ascites -LFTs showed slight increase in AST as it is 58 now and T Bili went from 2.4 -> 1.8 -> 2.2 -> 2.9 -She will need outpatient gastroenterology follow-up and referral made to [facility]  Hypokalemia  -Has a chronic hypokalemia and supposedly on KCl 40 mEq twice daily at baseline likely has worsened in the setting of diarrhea and serum bicarbonate infusion -Patient does not know why she has chronic hypokalemia, has not been worked up by nephrology, not on any contributive meds -Replete prior to discharge and continue home p.o. KCl -K+ is now 3.4 and magnesium level is 2.3 -Continue to monitor and replete as necessary -Repeat CMP in AM   Hypophosphatemia -Patient's Phos Level this AM was 2.4 -Replete with p.o K-Phos Neutral 500 mg x 1 -Continue to Monitor and Replete as Necessary -Repeat Phos Level in the AM   Abnormal LFTs with mildly elevated AST,  Hyperbilirubinemia -This is comparable to recent baseline,could be secondary to underlying cirrhosis as noted on imaging in the past -Continue to monitor and trend hepatic function panel  next -Abdominal examination appears benign and T bili is trending down -AST was trending down and and had improved to 39 but it is now 56 -T bili went from 3.0 -> 1.7 -> 2.0 -> 2.0 -> 2.4 -> 1.8 -> 2.2 -> 2.9 -Continue to monitor and trend -RUQ U/S done and showed likely Cirrhosis  -Repeat CMP as an outpatient and follow-up with gastroenterology  Normocytic Anemia Thrombocytopenia -Suspect this is secondary to splenomegaly, splenic sequestration -Component of hemodilution likely contributing to acute worsening, no symptoms at this time but dropping so will type and screen and transfuse 1 unit of pRBC -Continue to monitor for signs and symptoms of bleeding; currently no overt bleeding noted -Patient hemoglobin/hematocrit is now 10.8/25.1 after 1 unit of blood -Platelet count went from 71,000 on admission is now  84,000 -Repeat CBC in AM   Metabolic Acidosis -Unclear etiology at this point but did have a Lactic Acidosis on admission at 2.3 -CO2 was 12, chloride level 114, and anion gap was 13 on 10/17/2018; Now  CO2 is  9, anion gap is  7, and chloride is 114 -IVF now stopped  -Check for etiologies of acidosis including blood work and could not find any  -Repeat CMP in the outpatient setting  Suspected Poisoning? Ruled Out -Patient's mother adamantly states that the patient's ex-husband is trying to poison the patient and states that she got sick on the night that she saw her husband -We will notify social work for further investigation -In the interim we will check an arsenic level, volatile blood level, salicylate level, as well as ethylene glycol level given the patient still has an acidosis -Patient's arsenic level was 10, ethylene glycol levels less than 5, lead levels 1, mercury level 12.5, acetone level in the blood was negative, ethanol and blood were negative, isopropyl and blood was negative, methanol and blood was negative, and salicylate level was less than 7.0 -All Tox Scree  has been Negative so far -We will have Social work look into the issue further but doubt she has been poisoned    Hyponatremia, improved  -Improved as Na+ is now 140 -Will continue monitor and trend -Repeat CMP in AM   Cardiomegaly and vascular congestion -Continue to monitor carefully as patient was getting IV fluid hydration; IVF Hydration has now stopped  -Repeat chest x-ray yesterday showed "Mild stable cardiomegaly with suggestion of mild vascular congestion." -BNP was elevated at 357.7 -Echocardiogram as below -She is + 6.209 Liters since Admission  -Continue to Monitor for S/Sx of Volume Overload and currently not dyspneic  -Follow-up with PCP for further evaluation  Elevated troponin, flat -No chest pain. EKG nonacute. Significance unclear. Suspect demand ischemia secondary to acute illness. -ECHOCardiogram as below  Elevated D-Dimer -Significance unclear but likely in the setting of Infection.  -She has traveled recently, but it was a short flight on a private jet. Has no leg edema, no hypoxia, no hemodynamic instability. VTE does not seem likely to explain her presentation. Given her thrombocytopenia and low suspicion for PE, will not anticoagulate at this point.  -C/w Bedrest with bathroom privileges. -D-Dimer is trending down and went from 1.93 -> 1.27 -> 2.04; Will not pursue further workup or CTA at this time given lack of Renal Fxn improvement  -Repeat D-Dimer in AM  -Bilateral lower extremity venous Dopplers  ordered on admission but canceled as she has no edema, signs or symptoms of DVT  Several indeterminate left hepatic lobe lesions largest of which 3.4 cm seen on ultrasound 06/2014 -Consider outpatient MRI liver protocol and GI office -Checked RUQ U/S this visit and showed "Possible mild nodular hepatic contour which may represent cirrhosis. No suspicious focal hepatic lesions. Enlarged portal vein with normal hepatopetal portal vein flow." -Follow-up  with gastroenterology in outpatient setting  Left Rib Pain 2/2 to Fractures  -Checked a Left Rib X-Ray and showed Rib Fractures. X-Ray was read as "Old lateral sixth rib fracture. Suggestion of acute minimally displaced fractures of the left anterolateral seventh, eighth and ninth ribs. No additional fractures. Remainder the exam is unchanged." -Started Diclofenac 2 grams QID for Pain with improvement  and continue at discharge  Diarrhea, improved -Checking GI Pathogen Panel and was negative -Do not clinically feel as if this is C Difficile as she is afebrile, has no Leukocytosis and does not have any crampy abdominal pain -Likely Abx Induced from Meropenem -Trial of Loperamide was successful  -Continue to Monitor Carefully in outpatient setting  Discharge Instructions  Discharge Instructions    Call MD for:  difficulty breathing, headache or visual disturbances   Complete by: As directed    Call MD for:  extreme fatigue   Complete by: As directed    Call MD for:  hives   Complete by: As directed    Call MD for:  persistant dizziness or light-headedness   Complete by: As directed    Call MD for:  persistant nausea and vomiting   Complete by: As directed    Call MD for:  redness, tenderness, or signs of infection (pain, swelling, redness, odor or green/yellow discharge around incision site)   Complete by: As directed    Call MD for:  severe uncontrolled pain   Complete by: As directed    Call MD for:  temperature >100.4   Complete by: As directed    Diet - low sodium heart healthy   Complete by: As directed    2 Gram Sodium Diet with 1800 mL Fluid Restriction   Discharge instructions   Complete by: As directed    You were cared for by a hospitalist during your hospital stay. If you have any questions about your discharge medications or the care you received while you were in the hospital after you are discharged, you can call the unit and ask to speak with the hospitalist on  call if the hospitalist that took care of you is not available. Once you are discharged, your primary care physician will handle any further medical issues. Please note that NO REFILLS for any discharge medications will be authorized once you are discharged, as it is imperative that you return to your primary care physician (or establish a relationship with a primary care physician if you do not have one) for your aftercare needs so that they can reassess your need for medications and monitor your lab values.  Follow up with PCP and Gastroenterology within 1 week. Take all medications as prescribed. If symptoms change or worsen please return to the ED for evaluation   Increase activity slowly   Complete by: As directed      Allergies as of 10/24/2018      Reactions   Sulfa Antibiotics Nausea And Vomiting, Rash      Medication List    TAKE these medications   diclofenac sodium 1 % Gel Commonly known as:  VOLTAREN Apply 2 g topically 4 (four) times daily.   folic acid 1 MG tablet Commonly known as: FOLVITE Take 1 tablet (1 mg total) by mouth daily.   Klor-Con M20 20 MEQ tablet Generic drug: potassium chloride SA Take 40 mEq by mouth 2 (two) times a day.   MAGNESIUM PO Take 1 tablet by mouth daily.   multivitamin with minerals Tabs tablet Take 1 tablet by mouth daily.   ondansetron 4 MG tablet Commonly known as: ZOFRAN Take 1 tablet (4 mg total) by mouth every 6 (six) hours as needed for nausea.   saccharomyces boulardii 250 MG capsule Commonly known as: Florastor Take 1 capsule (250 mg total) by mouth 2 (two) times daily.   sodium bicarbonate 650 MG tablet Take 1 tablet (650 mg total) by mouth 2 (two) times daily.   thiamine 100 MG tablet Take 1 tablet (100 mg total) by mouth daily.   Vitamin D3 50 MCG (2000 UT) Tabs Take 1 tablet by mouth daily.      Follow-up Information    [facility] Follow up.        [facility] Follow [facility].   Specialty: Home  Health Services Why: HH nursing       Lawerance Cruel, MD. Call.   Specialty: Family Medicine Why: Follow up within 1 week  Contact information: [place] [address] [phone]        Gastroenterology, [facility]. Call.   Why: Follow up within 1-2 weeks Contact information: [address] [place] [address] [phone]          Allergies  Allergen Reactions  . Sulfa Antibiotics Nausea And Vomiting and Rash   Consultations:  None  Procedures/Studies: Dg Ribs Unilateral Left  Result Date: 10/19/2018 CLINICAL DATA:  Left anterior rib pain.  No injury. EXAM: LEFT RIBS - 2 VIEW COMPARISON:  Chest x-ray 10/16/2018 and chest x-ray earlier today. FINDINGS: Old lateral sixth rib fracture. Suggestion of acute minimally displaced fractures of the left anterolateral seventh, eighth and ninth ribs. No additional fractures. Remainder the exam is unchanged. IMPRESSION: Fractures of the anterolateral left seventh through ninth ribs. Old left sixth rib fracture. Electronically Signed   By: Marin Olp M.D.   On: 10/19/2018 19:40   Dg Chest Port 1 View  Result Date: 10/19/2018 CLINICAL DATA:  Shortness of breath. EXAM: PORTABLE CHEST 1 VIEW COMPARISON:  10/16/2018 and 06/25/2018 FINDINGS: Lungs are adequately inflated with subtle hazy prominence of the perihilar markings suggesting mild vascular congestion. No definite lobar consolidation. No effusion. Mild stable cardiomegaly. Remainder of the exam is unchanged. IMPRESSION: Mild stable cardiomegaly with suggestion of mild vascular congestion. Electronically Signed   By: Marin Olp M.D.   On: 10/19/2018 19:36   Dg Chest Portable 1 View  Result Date: 10/16/2018 CLINICAL DATA:  Patient arrived via [facility] from home. Pt c/o cough, SOB, N/V, body aches and fever. Pt stated she started "feeling weird" on Thursday and has progressed. Patient stated she took tylenol before EMS arrived. Medical hx of HTN.  EXAM: PORTABLE CHEST 1 VIEW COMPARISON:  Chest radiograph 06/09/2018, 06/25/2018 FINDINGS: Stable cardiomediastinal contours with enlarged heart size. Central venous congestion. No overt edema or new focal consolidation. No pneumothorax or large pleural effusion. Osteopenia. IMPRESSION: Cardiomegaly with central venous congestion. No overt edema or focal consolidation. Electronically Signed   By: Audie Pinto M.D.   On: 10/16/2018 11:31   [place] Ekg Site Rite  Result Date: 10/20/2018  If [employer] not attached, placement could not be confirmed due to current cardiac rhythm.  US Abdomen Limited Ruq  Result Date: 10/19/2018 CLINICAL DATA:  63 year old female with abnormal LFTs. EXAM: ULTRASOUND ABDOMEN LIMITED RIGHT UPPER QUADRANT COMPARISON:  None. FINDINGS: Gallbladder: Gallbladder wall thickening/edema noted measuring up to 11 mm. A single mobile 6 mm gallstone is noted. No sonographic Murphy sign noted. Common bile duct: Diameter: 2 mm.  No intrahepatic or extrahepatic biliary dilatation. Liver: Possible minimal hepatic contour nodularity noted which may represent cirrhosis. No focal hepatic masses are present. A 1.6 cm LEFT hepatic cyst is present. The portal vein is enlarged measuring up to 1.9 cm in greatest diameter. Portal vein is patent on color Doppler imaging with normal direction of blood flow towards the liver. Other: A trace amount of perihepatic ascites is present. IMPRESSION: 1. Single mobile gallstone with gallbladder wall thickening. The extent of this gallbladder wall thickening is more likely related to hepatic dysfunction/ascites. If there is strong clinical suspicion for acute cholecystitis, consider nuclear medicine study. 2. Possible mild nodular hepatic contour which may represent cirrhosis. No suspicious focal hepatic lesions. Enlarged portal vein with normal hepatopetal portal vein flow. Electronically Signed   By: Margarette Canada M.D.   On: 10/19/2018 11:08     Subjective:  Seen and examined at bedside and she is doing well.  Denies chest pain, lightheadedness or dizziness.  She is ready to go home and understands that she will need to follow-up with PCP and gastroenterology in outpatient setting.  Discharge Exam: Vitals:   10/24/18 0928 10/24/18 1415  BP: (!) 142/84 (!) 141/73  Pulse: 72 78  Resp: 18   Temp: 98.3 F (36.8 C) 98.1 F (36.7 C)  SpO2: 100% 100%   Vitals:   10/23/18 2111 10/24/18 0614 10/24/18 0928 10/24/18 1415  BP: 135/70 134/76 (!) 142/84 (!) 141/73  Pulse: 81 75 72 78  Resp: 18 16 18    Temp: 98.9 F (37.2 C) 98 F (36.7 C) 98.3 F (36.8 C) 98.1 F (36.7 C)  TempSrc:   Oral Oral  SpO2: 99% 97% 100% 100%  Weight:   65 kg   Height:   5' 8"  (1.727 m)    General: Pt is alert, awake, not in acute distress Cardiovascular: RRR, S1/S2 +, no rubs, no gallops Respiratory: Diminished bilaterally, no wheezing, no rhonchi Abdominal: Soft, NT, mildly distended, bowel sounds + Extremities: Trace edema, no cyanosis  The results of significant diagnostics from this hospitalization (including imaging, microbiology, ancillary and laboratory) are listed below for reference.    Microbiology: Recent Results (from the past 240 hour(s))  Blood Culture (routine x 2)     Status: Abnormal   Collection Time: 10/16/18 10:45 AM   Specimen: BLOOD RIGHT FOREARM  Result Value Ref Range Status   Specimen Description   Final    BLOOD RIGHT FOREARM Performed at [facility] [address], [place], [place] [address]    Special Requests   Final    BOTTLES DRAWN AEROBIC AND ANAEROBIC Blood Culture adequate volume Performed at [facility] [address] [place], [place] [address]    Culture  Setup Time   Final    GRAM NEGATIVE RODS CRITICAL RESULT CALLED TO, READ BACK BY AND VERIFIED WITH: PHRMD J GIMSLEY @0259  10/17/18 BY S GEZAHEGN IN BOTH AEROBIC AND ANAEROBIC BOTTLES Performed at [facility], [place] [address] [place], [place] [address]    Culture (A)  Final    ESCHERICHIA COLI Confirmed Extended Spectrum Beta-Lactamase Producer (ESBL).  In bloodstream infections from ESBL organisms, carbapenems are preferred over piperacillin/tazobactam. They are shown to have a lower risk of mortality.    Report Status 10/18/2018 FINAL  Final   Organism ID, Bacteria ESCHERICHIA COLI  Final      Susceptibility   Escherichia coli - MIC*    AMPICILLIN >=32 RESISTANT Resistant     CEFAZOLIN >=64 RESISTANT Resistant     CEFEPIME >=64 RESISTANT Resistant     CEFTAZIDIME RESISTANT Resistant     CEFTRIAXONE >=64 RESISTANT Resistant     CIPROFLOXACIN 1 SENSITIVE Sensitive     GENTAMICIN >=16 RESISTANT Resistant     IMIPENEM <=0.25 SENSITIVE Sensitive     TRIMETH/SULFA >=320 RESISTANT Resistant     AMPICILLIN/SULBACTAM >=32 RESISTANT Resistant     PIP/TAZO <=4 SENSITIVE Sensitive     Extended ESBL POSITIVE Resistant     * ESCHERICHIA COLI  Blood Culture ID Panel (Reflexed)     Status: Abnormal   Collection Time: 10/16/18 10:45 AM  Result Value Ref Range Status   Enterococcus species NOT DETECTED NOT DETECTED Final   Listeria monocytogenes NOT DETECTED NOT DETECTED Final   Staphylococcus species NOT DETECTED NOT DETECTED Final   Staphylococcus aureus (BCID) NOT DETECTED NOT DETECTED Final   Streptococcus species NOT DETECTED NOT DETECTED Final   Streptococcus agalactiae NOT DETECTED NOT DETECTED Final   Streptococcus pneumoniae NOT DETECTED NOT DETECTED Final   Streptococcus pyogenes NOT DETECTED NOT DETECTED Final   Acinetobacter baumannii NOT DETECTED NOT DETECTED Final   Enterobacteriaceae species DETECTED (A) NOT DETECTED Final    Comment: Enterobacteriaceae represent a large family of gram-negative bacteria, not a single organism. CRITICAL RESULT CALLED TO, READ BACK BY AND VERIFIED WITH: PHRMD J GIMSLEY @0259  10/17/18 BY S GEZAHEGN    Enterobacter cloacae complex NOT DETECTED NOT DETECTED Final    Escherichia coli DETECTED (A) NOT DETECTED Final    Comment: CRITICAL RESULT CALLED TO, READ BACK BY AND VERIFIED WITH: PHRMD J GIMSLEY @0259  10/17/18 BY S GEZAHEGN    Klebsiella oxytoca NOT DETECTED NOT DETECTED Final   Klebsiella pneumoniae NOT DETECTED NOT DETECTED Final   Proteus species NOT DETECTED NOT DETECTED Final   Serratia marcescens NOT DETECTED NOT DETECTED Final   Carbapenem resistance NOT DETECTED NOT DETECTED Final   Haemophilus influenzae NOT DETECTED NOT DETECTED Final   Neisseria meningitidis NOT DETECTED NOT DETECTED Final   Pseudomonas aeruginosa NOT DETECTED NOT DETECTED Final   Candida albicans NOT DETECTED NOT DETECTED Final   Candida glabrata NOT DETECTED NOT DETECTED Final   Candida krusei NOT DETECTED NOT DETECTED Final   Candida parapsilosis NOT DETECTED NOT DETECTED Final   Candida tropicalis NOT DETECTED NOT DETECTED Final    Comment: Performed at [facility], [place] [address] [place], [place] [address]  SARS Coronavirus 2 [facility] order, Performed in [facility] [facility] hospital lab) Nasopharyngeal Nasopharyngeal Swab     Status: None   Collection Time: 10/16/18 10:54 AM   Specimen: Nasopharyngeal Swab  Result Value Ref Range Status   SARS Coronavirus 2 NEGATIVE NEGATIVE Final    Comment: (NOTE) If result is NEGATIVE SARS-CoV-2 target nucleic acids are NOT DETECTED. The SARS-CoV-2 RNA is generally detectable in upper and lower  respiratory specimens during the acute phase of infection. The lowest  concentration of SARS-CoV-2 viral copies this assay can detect is 250  copies / mL. A negative result does not preclude SARS-CoV-2  infection  and should not be used as the sole basis for treatment or other  patient management decisions.  A negative result may occur with  improper specimen collection / handling, submission of specimen other  than nasopharyngeal swab, presence of viral mutation(s) within the  areas targeted by this assay, and inadequate number of  viral copies  (<250 copies / mL). A negative result must be combined with clinical  observations, patient history, and epidemiological information. If result is POSITIVE SARS-CoV-2 target nucleic acids are DETECTED. The SARS-CoV-2 RNA is generally detectable in upper and lower  respiratory specimens dur ing the acute phase of infection.  Positive  results are indicative of active infection with SARS-CoV-2.  Clinical  correlation with patient history and other diagnostic information is  necessary to determine patient infection status.  Positive results do  not rule out bacterial infection or co-infection with other viruses. If result is PRESUMPTIVE POSTIVE SARS-CoV-2 nucleic acids MAY BE PRESENT.   A presumptive positive result was obtained on the submitted specimen  and confirmed on repeat testing.  While 2019 novel coronavirus  (SARS-CoV-2) nucleic acids may be present in the submitted sample  additional confirmatory testing may be necessary for epidemiological  and / or clinical management purposes  to differentiate between  SARS-CoV-2 and other Sarbecovirus currently known to infect humans.  If clinically indicated additional testing with an alternate test  methodology [phone]) is advised. The SARS-CoV-2 RNA is generally  detectable in upper and lower respiratory sp ecimens during the acute  phase of infection. The expected result is Negative. Fact Sheet for Patients:  StrictlyIdeas.no Fact Sheet for Healthcare Providers: BankingDealers.co.za This test is not yet approved or cleared by the [place] FDA and has been authorized for detection and/or diagnosis of SARS-CoV-2 by FDA under an Emergency Use Authorization (EUA).  This EUA will remain in effect (meaning this test can be used) for the duration of the COVID-19 declaration under Section 564(b)(1) of the Act, 21 U.S.C. section 360bbb-3(b)(1), unless the authorization is  terminated or revoked sooner. Performed at [facility], [place] [address] [place], [place] [address]   Blood Culture (routine x 2)     Status: Abnormal   Collection Time: 10/16/18 11:45 AM   Specimen: BLOOD LEFT HAND  Result Value Ref Range Status   Specimen Description   Final    BLOOD LEFT HAND Performed at [facility] [address] [place], [place] [address]    Special Requests   Final    BOTTLES DRAWN AEROBIC AND ANAEROBIC Blood Culture adequate volume Performed at [facility] [address], [place], [place] [address]    Culture  Setup Time   Final    GRAM NEGATIVE RODS CRITICAL VALUE NOTED.  VALUE IS CONSISTENT WITH PREVIOUSLY REPORTED AND CALLED VALUE. IN BOTH AEROBIC AND ANAEROBIC BOTTLES    Culture (A)  Final    ESCHERICHIA COLI SUSCEPTIBILITIES PERFORMED ON PREVIOUS CULTURE WITHIN THE LAST 5 DAYS. Performed at [facility], [place] [address], [place], [place] [address]    Report Status 10/19/2018 FINAL  Final  Urine culture     Status: Abnormal   Collection Time: 10/16/18  2:26 PM   Specimen: Urine, Random  Result Value Ref Range Status   Specimen Description   Final    URINE, RANDOM Performed at [facility] [address] [place], [place] [address]    Special Requests   Final    NONE  Performed at [facility], [place] [address], [place], [place] [address]    Culture (A)  Final    >=100,000 COLONIES/mL ESCHERICHIA COLI Confirmed Extended Spectrum Beta-Lactamase Producer (ESBL).  In bloodstream infections from ESBL organisms, carbapenems are preferred over piperacillin/tazobactam. They are shown to have a lower risk of mortality.    Report Status 10/18/2018 FINAL  Final   Organism ID, Bacteria ESCHERICHIA COLI (A)  Final      Susceptibility   Escherichia coli - MIC*    AMPICILLIN >=32 RESISTANT Resistant     CEFAZOLIN >=64 RESISTANT Resistant      CEFTRIAXONE >=64 RESISTANT Resistant     CIPROFLOXACIN 1 SENSITIVE Sensitive     GENTAMICIN >=16 RESISTANT Resistant     IMIPENEM <=0.25 SENSITIVE Sensitive     NITROFURANTOIN <=16 SENSITIVE Sensitive     TRIMETH/SULFA >=320 RESISTANT Resistant     AMPICILLIN/SULBACTAM >=32 RESISTANT Resistant     PIP/TAZO <=4 SENSITIVE Sensitive     Extended ESBL POSITIVE Resistant     * >=100,000 COLONIES/mL ESCHERICHIA COLI  GI pathogen panel by PCR, stool     Status: None   Collection Time: 10/19/18 11:58 AM   Specimen: Stool  Result Value Ref Range Status   Plesiomonas shigelloides NOT DETECTED NOT DETECTED Final   Yersinia enterocolitica NOT DETECTED NOT DETECTED Final   Vibrio NOT DETECTED NOT DETECTED Final   Enteropathogenic E coli NOT DETECTED NOT DETECTED Final   E coli (ETEC) LT/ST NOT DETECTED NOT DETECTED Final   E coli 4010 by PCR Not applicable NOT DETECTED Final   Cryptosporidium by PCR NOT DETECTED NOT DETECTED Final   Entamoeba histolytica NOT DETECTED NOT DETECTED Final   Adenovirus F 40/41 NOT DETECTED NOT DETECTED Final   Norovirus GI/GII NOT DETECTED NOT DETECTED Final   Sapovirus NOT DETECTED NOT DETECTED Final    Comment: (NOTE) Performed At: [facility] [place], [place] [id number] Rush Farmer MD IH:[id number]    Vibrio cholerae NOT DETECTED NOT DETECTED Final   Campylobacter by PCR NOT DETECTED NOT DETECTED Final   Salmonella by PCR NOT DETECTED NOT DETECTED Final   E coli (STEC) NOT DETECTED NOT DETECTED Final   Enteroaggregative E coli NOT DETECTED NOT DETECTED Final   Shigella by PCR NOT DETECTED NOT DETECTED Final   Cyclospora cayetanensis NOT DETECTED NOT DETECTED Final   Astrovirus NOT DETECTED NOT DETECTED Final   G lamblia by PCR NOT DETECTED NOT DETECTED Final   Rotavirus A by PCR NOT DETECTED NOT DETECTED Final    Labs: BNP (last 3 results) Recent Labs    10/16/18 1042  BNP 756.4*   Basic Metabolic Panel: Recent Labs  Lab  10/20/18 0356 10/21/18 0509 10/22/18 0335 10/23/18 0336 10/24/18 0830  NA 140 140 141 139 140  K 3.7 3.6 3.5 3.6 3.4*  CL 112* 112* 113* 114* 114*  CO2 20* 21* 22 20* 19*  GLUCOSE 98 89 98 97 88  BUN 9 11 13 13 13   CREATININE 1.20* 1.19* 1.34* 1.20* 1.07*  CALCIUM 8.8* 9.1 9.1 8.8* 8.6*  MG 2.0 2.2 2.2 2.3 2.3  PHOS 2.5 2.4* 3.5 2.8 2.4*   Liver Function Tests: Recent Labs  Lab 10/20/18 0356 10/21/18 0509 10/22/18 0335 10/23/18 0336 10/24/18 0830  AST 40 45* 53* 53* 58*  ALT 25 23 24 22 24   ALKPHOS 175* 184* 234* 205* 205*  BILITOT 2.0* 2.4* 1.8* 2.2* 2.9*  PROT 4.8* 5.0* 5.1* 4.9* 5.1*  ALBUMIN 2.2* 2.3* 2.2* 2.1* 2.1*   No results for input(s): LIPASE, AMYLASE in the last 168 hours. No results for input(s): AMMONIA in the last 168 hours. CBC: Recent Labs  Lab 10/20/18 0356 10/21/18 0509 10/22/18 0335 10/23/18 0336 10/24/18 0830  WBC 6.2 6.0 5.6 5.9 5.3  NEUTROABS 3.8 3.8 3.6 3.7 3.4  HGB 7.7* 7.7* 7.3* 7.0* 7.8*  HCT 24.6* 25.1* 23.7* 22.3* 25.1*  MCV 93.5 94.0 95.2 95.3 95.1  PLT 77* 91* 92* 89* 84*   Cardiac Enzymes: No results for input(s): CKTOTAL, CKMB, CKMBINDEX, TROPONINI in the last 168 hours. BNP: Invalid input(s): POCBNP CBG: No results for input(s): GLUCAP in the last 168 hours. D-Dimer No results for input(s): DDIMER in the last 72 hours. Hgb A1c No results for input(s): HGBA1C in the last 72 hours. Lipid Profile No results for input(s): CHOL, HDL, LDLCALC, TRIG, CHOLHDL, LDLDIRECT in the last 72 hours. Thyroid function studies No results for input(s): TSH, T4TOTAL, T3FREE, THYROIDAB in the last 72 hours.  Invalid input(s): FREET3 Anemia work up No results for input(s): VITAMINB12, FOLATE, FERRITIN, TIBC, IRON, RETICCTPCT in the last 72 hours. Urinalysis    Component Value Date/Time   COLORURINE YELLOW 10/16/2018 1425   APPEARANCEUR CLEAR 10/16/2018 1425   LABSPEC 1.003 (L) 10/16/2018 1425   PHURINE 6.0 10/16/2018 1425   GLUCOSEU  NEGATIVE 10/16/2018 1425   HGBUR MODERATE (A) 10/16/2018 1425   BILIRUBINUR NEGATIVE 10/16/2018 1425   KETONESUR NEGATIVE 10/16/2018 1425   PROTEINUR NEGATIVE 10/16/2018 1425   NITRITE NEGATIVE 10/16/2018 1425   LEUKOCYTESUR LARGE (A) 10/16/2018 1425   Sepsis Labs Invalid input(s): PROCALCITONIN,  WBC,  LACTICIDVEN Microbiology Recent Results (from the past 240 hour(s))  Blood Culture (routine x 2)     Status: Abnormal   Collection Time: 10/16/18 10:45 AM   Specimen: BLOOD RIGHT FOREARM  Result Value Ref Range Status   Specimen Description   Final    BLOOD RIGHT FOREARM Performed at [facility] [address], [place], [place] [address]    Special Requests   Final    BOTTLES DRAWN AEROBIC AND ANAEROBIC Blood Culture adequate volume Performed at [facility] [address] [place], [place] [address]    Culture  Setup Time   Final    GRAM NEGATIVE RODS CRITICAL RESULT CALLED TO, READ BACK BY AND VERIFIED WITH: PHRMD J GIMSLEY @0259  10/17/18 BY S GEZAHEGN IN BOTH AEROBIC AND ANAEROBIC BOTTLES Performed at [facility], [place] [address] [place], [place] [address]    Culture (A)  Final    ESCHERICHIA COLI Confirmed Extended Spectrum Beta-Lactamase Producer (ESBL).  In bloodstream infections from ESBL organisms, carbapenems are preferred over piperacillin/tazobactam. They are shown to have a lower risk of mortality.    Report Status 10/18/2018 FINAL  Final   Organism ID, Bacteria ESCHERICHIA COLI  Final      Susceptibility   Escherichia coli - MIC*    AMPICILLIN >=32 RESISTANT Resistant     CEFAZOLIN >=64 RESISTANT Resistant     CEFEPIME >=64 RESISTANT Resistant     CEFTAZIDIME RESISTANT Resistant     CEFTRIAXONE >=64 RESISTANT Resistant     CIPROFLOXACIN 1 SENSITIVE Sensitive     GENTAMICIN >=16 RESISTANT Resistant     IMIPENEM <=0.25 SENSITIVE Sensitive     TRIMETH/SULFA >=320 RESISTANT Resistant     AMPICILLIN/SULBACTAM >=32  RESISTANT Resistant     PIP/TAZO <=4 SENSITIVE Sensitive     Extended ESBL POSITIVE Resistant     *  ESCHERICHIA COLI  Blood Culture ID Panel (Reflexed)     Status: Abnormal   Collection Time: 10/16/18 10:45 AM  Result Value Ref Range Status   Enterococcus species NOT DETECTED NOT DETECTED Final   Listeria monocytogenes NOT DETECTED NOT DETECTED Final   Staphylococcus species NOT DETECTED NOT DETECTED Final   Staphylococcus aureus (BCID) NOT DETECTED NOT DETECTED Final   Streptococcus species NOT DETECTED NOT DETECTED Final   Streptococcus agalactiae NOT DETECTED NOT DETECTED Final   Streptococcus pneumoniae NOT DETECTED NOT DETECTED Final   Streptococcus pyogenes NOT DETECTED NOT DETECTED Final   Acinetobacter baumannii NOT DETECTED NOT DETECTED Final   Enterobacteriaceae species DETECTED (A) NOT DETECTED Final    Comment: Enterobacteriaceae represent a large family of gram-negative bacteria, not a single organism. CRITICAL RESULT CALLED TO, READ BACK BY AND VERIFIED WITH: PHRMD J GIMSLEY @0259  10/17/18 BY S GEZAHEGN    Enterobacter cloacae complex NOT DETECTED NOT DETECTED Final   Escherichia coli DETECTED (A) NOT DETECTED Final    Comment: CRITICAL RESULT CALLED TO, READ BACK BY AND VERIFIED WITH: PHRMD J GIMSLEY @0259  10/17/18 BY S GEZAHEGN    Klebsiella oxytoca NOT DETECTED NOT DETECTED Final   Klebsiella pneumoniae NOT DETECTED NOT DETECTED Final   Proteus species NOT DETECTED NOT DETECTED Final   Serratia marcescens NOT DETECTED NOT DETECTED Final   Carbapenem resistance NOT DETECTED NOT DETECTED Final   Haemophilus influenzae NOT DETECTED NOT DETECTED Final   Neisseria meningitidis NOT DETECTED NOT DETECTED Final   Pseudomonas aeruginosa NOT DETECTED NOT DETECTED Final   Candida albicans NOT DETECTED NOT DETECTED Final   Candida glabrata NOT DETECTED NOT DETECTED Final   Candida krusei NOT DETECTED NOT DETECTED Final   Candida parapsilosis NOT DETECTED NOT DETECTED Final    Candida tropicalis NOT DETECTED NOT DETECTED Final    Comment: Performed at [facility], [place]. [address] [place], [place] [address]  SARS Coronavirus 2 [facility] order, Performed in [facility] hospital lab) Nasopharyngeal Nasopharyngeal Swab     Status: None   Collection Time: 10/16/18 10:54 AM   Specimen: Nasopharyngeal Swab  Result Value Ref Range Status   SARS Coronavirus 2 NEGATIVE NEGATIVE Final    Comment: (NOTE) If result is NEGATIVE SARS-CoV-2 target nucleic acids are NOT DETECTED. The SARS-CoV-2 RNA is generally detectable in upper and lower  respiratory specimens during the acute phase of infection. The lowest  concentration of SARS-CoV-2 viral copies this assay can detect is 250  copies / mL. A negative result does not preclude SARS-CoV-2 infection  and should not be used as the sole basis for treatment or other  patient management decisions.  A negative result may occur with  improper specimen collection / handling, submission of specimen other  than nasopharyngeal swab, presence of viral mutation(s) within the  areas targeted by this assay, and inadequate number of viral copies  (<250 copies / mL). A negative result must be combined with clinical  observations, patient history, and epidemiological information. If result is POSITIVE SARS-CoV-2 target nucleic acids are DETECTED. The SARS-CoV-2 RNA is generally detectable in upper and lower  respiratory specimens dur ing the acute phase of infection.  Positive  results are indicative of active infection with SARS-CoV-2.  Clinical  correlation with patient history and other diagnostic information is  necessary to determine patient infection status.  Positive results do  not rule out bacterial infection or co-infection with other viruses. If result is PRESUMPTIVE POSTIVE SARS-CoV-2 nucleic acids MAY BE  PRESENT.   A presumptive positive result was obtained on the submitted specimen  and confirmed on repeat testing.   While 2019 novel coronavirus  (SARS-CoV-2) nucleic acids may be present in the submitted sample  additional confirmatory testing may be necessary for epidemiological  and / or clinical management purposes  to differentiate between  SARS-CoV-2 and other Sarbecovirus currently known to infect humans.  If clinically indicated additional testing with an alternate test  methodology [phone]) is advised. The SARS-CoV-2 RNA is generally  detectable in upper and lower respiratory sp ecimens during the acute  phase of infection. The expected result is Negative. Fact Sheet for Patients:  StrictlyIdeas.no Fact Sheet for Healthcare Providers: BankingDealers.co.za This test is not yet approved or cleared by the [place] FDA and has been authorized for detection and/or diagnosis of SARS-CoV-2 by FDA under an Emergency Use Authorization (EUA).  This EUA will remain in effect (meaning this test can be used) for the duration of the COVID-19 declaration under Section 564(b)(1) of the Act, 21 U.S.C. section 360bbb-3(b)(1), unless the authorization is terminated or revoked sooner. Performed at [facility], [place] [address], [place], [place] [address]   Blood Culture (routine x 2)     Status: Abnormal   Collection Time: 10/16/18 11:45 AM   Specimen: BLOOD LEFT HAND  Result Value Ref Range Status   Specimen Description   Final    BLOOD LEFT HAND Performed at [facility] [place] [address] [place], [place] [address]    Special Requests   Final    BOTTLES DRAWN AEROBIC AND ANAEROBIC Blood Culture adequate volume Performed at [facility] [address] [place], [place] [address]    Culture  Setup Time   Final    GRAM NEGATIVE RODS CRITICAL VALUE NOTED.  VALUE IS CONSISTENT WITH PREVIOUSLY REPORTED AND CALLED VALUE. IN BOTH AEROBIC AND ANAEROBIC BOTTLES    Culture (A)  Final    ESCHERICHIA  COLI SUSCEPTIBILITIES PERFORMED ON PREVIOUS CULTURE WITHIN THE LAST 5 DAYS. Performed at [facility], [place] [address], [place], [place] [address]    Report Status 10/19/2018 FINAL  Final  Urine culture     Status: Abnormal   Collection Time: 10/16/18  2:26 PM   Specimen: Urine, Random  Result Value Ref Range Status   Specimen Description   Final    URINE, RANDOM Performed at [facility] [address], [place], [place] [address]    Special Requests   Final    NONE Performed at [facility], [place] [address], [place], [place] [address]    Culture (A)  Final    >=100,000 COLONIES/mL ESCHERICHIA COLI Confirmed Extended Spectrum Beta-Lactamase Producer (ESBL).  In bloodstream infections from ESBL organisms, carbapenems are preferred over piperacillin/tazobactam. They are shown to have a lower risk of mortality.    Report Status 10/18/2018 FINAL  Final   Organism ID, Bacteria ESCHERICHIA COLI (A)  Final      Susceptibility   Escherichia coli - MIC*    AMPICILLIN >=32 RESISTANT Resistant     CEFAZOLIN >=64 RESISTANT Resistant     CEFTRIAXONE >=64 RESISTANT Resistant     CIPROFLOXACIN 1 SENSITIVE Sensitive     GENTAMICIN >=16 RESISTANT Resistant     IMIPENEM <=0.25 SENSITIVE Sensitive     NITROFURANTOIN <=16 SENSITIVE Sensitive     TRIMETH/SULFA >=320 RESISTANT Resistant     AMPICILLIN/SULBACTAM >=32 RESISTANT Resistant     PIP/TAZO <=4  SENSITIVE Sensitive     Extended ESBL POSITIVE Resistant     * >=100,000 COLONIES/mL ESCHERICHIA COLI  GI pathogen panel by PCR, stool     Status: None   Collection Time: 10/19/18 11:58 AM   Specimen: Stool  Result Value Ref Range Status   Plesiomonas shigelloides NOT DETECTED NOT DETECTED Final   Yersinia enterocolitica NOT DETECTED NOT DETECTED Final   Vibrio NOT DETECTED NOT DETECTED Final   Enteropathogenic E coli NOT DETECTED NOT DETECTED Final   E coli (ETEC) LT/ST NOT DETECTED NOT DETECTED Final    E coli 1003 by PCR Not applicable NOT DETECTED Final   Cryptosporidium by PCR NOT DETECTED NOT DETECTED Final   Entamoeba histolytica NOT DETECTED NOT DETECTED Final   Adenovirus F 40/41 NOT DETECTED NOT DETECTED Final   Norovirus GI/GII NOT DETECTED NOT DETECTED Final   Sapovirus NOT DETECTED NOT DETECTED Final    Comment: (NOTE) Performed At: [facility] [address] [place], [place] [id number] Rush Farmer MD TP:[id number]    Vibrio cholerae NOT DETECTED NOT DETECTED Final   Campylobacter by PCR NOT DETECTED NOT DETECTED Final   Salmonella by PCR NOT DETECTED NOT DETECTED Final   E coli (STEC) NOT DETECTED NOT DETECTED Final   Enteroaggregative E coli NOT DETECTED NOT DETECTED Final   Shigella by PCR NOT DETECTED NOT DETECTED Final   Cyclospora cayetanensis NOT DETECTED NOT DETECTED Final   Astrovirus NOT DETECTED NOT DETECTED Final   G lamblia by PCR NOT DETECTED NOT DETECTED Final   Rotavirus A by PCR NOT DETECTED NOT DETECTED Final   Time coordinating discharge: 35 minutes  SIGNED:  Kerney Elbe, DO [facility] 10/24/2018, 9:50 PM Pager is on AMION  If 7PM-7AM, please contact night-coverage [email or web address] Password [email or web address]

## 2018-10-24 NOTE — Progress Notes (Signed)
Reviewed discharge paperwork, medication regimen, & follow up appointments including appointment at [facility] on 10/16 at 8:45am. Patient was discharged via wheelchair by NT.

## 2018-10-30 ENCOUNTER — Emergency Department (HOSPITAL_COMMUNITY): Payer: Medicare Other

## 2018-10-30 ENCOUNTER — Other Ambulatory Visit: Payer: Self-pay

## 2018-10-30 ENCOUNTER — Inpatient Hospital Stay (HOSPITAL_COMMUNITY)
Admission: EM | Admit: 2018-10-30 | Discharge: 2018-11-02 | DRG: 872 | Disposition: A | Discharge status: Home / Self Care | Payer: Medicare Other | Attending: Internal Medicine | Admitting: Internal Medicine

## 2018-10-30 ENCOUNTER — Encounter (HOSPITAL_COMMUNITY): Payer: Self-pay

## 2018-10-30 DIAGNOSIS — R7989 Other specified abnormal findings of blood chemistry: Secondary | ICD-10-CM | POA: Diagnosis present

## 2018-10-30 DIAGNOSIS — R011 Cardiac murmur, unspecified: Secondary | ICD-10-CM | POA: Diagnosis present

## 2018-10-30 DIAGNOSIS — A0472 Enterocolitis due to Clostridium difficile, not specified as recurrent: Secondary | ICD-10-CM | POA: Diagnosis present

## 2018-10-30 DIAGNOSIS — K7469 Other cirrhosis of liver: Secondary | ICD-10-CM | POA: Diagnosis present

## 2018-10-30 DIAGNOSIS — E876 Hypokalemia: Secondary | ICD-10-CM | POA: Diagnosis present

## 2018-10-30 DIAGNOSIS — A414 Sepsis due to anaerobes: Secondary | ICD-10-CM | POA: Diagnosis not present

## 2018-10-30 DIAGNOSIS — I2510 Atherosclerotic heart disease of native coronary artery without angina pectoris: Secondary | ICD-10-CM | POA: Diagnosis present

## 2018-10-30 DIAGNOSIS — R6520 Severe sepsis without septic shock: Secondary | ICD-10-CM

## 2018-10-30 DIAGNOSIS — B9620 Unspecified Escherichia coli [E. coli] as the cause of diseases classified elsewhere: Secondary | ICD-10-CM | POA: Diagnosis present

## 2018-10-30 DIAGNOSIS — Z87891 Personal history of nicotine dependence: Secondary | ICD-10-CM | POA: Diagnosis not present

## 2018-10-30 DIAGNOSIS — A4151 Sepsis due to Escherichia coli [E. coli]: Secondary | ICD-10-CM

## 2018-10-30 DIAGNOSIS — R791 Abnormal coagulation profile: Secondary | ICD-10-CM | POA: Diagnosis not present

## 2018-10-30 DIAGNOSIS — D6959 Other secondary thrombocytopenia: Secondary | ICD-10-CM | POA: Diagnosis present

## 2018-10-30 DIAGNOSIS — N1831 Chronic kidney disease, stage 3a: Secondary | ICD-10-CM | POA: Diagnosis not present

## 2018-10-30 DIAGNOSIS — E872 Acidosis: Secondary | ICD-10-CM | POA: Diagnosis present

## 2018-10-30 DIAGNOSIS — A419 Sepsis, unspecified organism: Secondary | ICD-10-CM

## 2018-10-30 DIAGNOSIS — E860 Dehydration: Secondary | ICD-10-CM

## 2018-10-30 DIAGNOSIS — I361 Nonrheumatic tricuspid (valve) insufficiency: Secondary | ICD-10-CM | POA: Diagnosis not present

## 2018-10-30 DIAGNOSIS — N179 Acute kidney failure, unspecified: Secondary | ICD-10-CM | POA: Diagnosis present

## 2018-10-30 DIAGNOSIS — Z20828 Contact with and (suspected) exposure to other viral communicable diseases: Secondary | ICD-10-CM | POA: Diagnosis present

## 2018-10-30 DIAGNOSIS — R748 Abnormal levels of other serum enzymes: Secondary | ICD-10-CM | POA: Diagnosis not present

## 2018-10-30 DIAGNOSIS — N170 Acute kidney failure with tubular necrosis: Secondary | ICD-10-CM | POA: Diagnosis not present

## 2018-10-30 DIAGNOSIS — N183 Chronic kidney disease, stage 3 unspecified: Secondary | ICD-10-CM | POA: Diagnosis present

## 2018-10-30 DIAGNOSIS — D649 Anemia, unspecified: Secondary | ICD-10-CM

## 2018-10-30 DIAGNOSIS — N390 Urinary tract infection, site not specified: Secondary | ICD-10-CM | POA: Diagnosis present

## 2018-10-30 DIAGNOSIS — I340 Nonrheumatic mitral (valve) insufficiency: Secondary | ICD-10-CM | POA: Diagnosis present

## 2018-10-30 DIAGNOSIS — I129 Hypertensive chronic kidney disease with stage 1 through stage 4 chronic kidney disease, or unspecified chronic kidney disease: Secondary | ICD-10-CM | POA: Diagnosis present

## 2018-10-30 DIAGNOSIS — R197 Diarrhea, unspecified: Secondary | ICD-10-CM

## 2018-10-30 DIAGNOSIS — Z8249 Family history of ischemic heart disease and other diseases of the circulatory system: Secondary | ICD-10-CM

## 2018-10-30 DIAGNOSIS — K7460 Unspecified cirrhosis of liver: Secondary | ICD-10-CM | POA: Diagnosis present

## 2018-10-30 DIAGNOSIS — D696 Thrombocytopenia, unspecified: Secondary | ICD-10-CM | POA: Diagnosis not present

## 2018-10-30 DIAGNOSIS — N3000 Acute cystitis without hematuria: Secondary | ICD-10-CM | POA: Diagnosis not present

## 2018-10-30 LAB — COMPREHENSIVE METABOLIC PANEL
ALT: 28 U/L (ref 0–44)
AST: 71 U/L — ABNORMAL HIGH (ref 15–41)
Albumin: 2.7 g/dL — ABNORMAL LOW (ref 3.5–5.0)
Alkaline Phosphatase: 176 U/L — ABNORMAL HIGH (ref 38–126)
Anion gap: 11 (ref 5–15)
BUN: 15 mg/dL (ref 8–23)
CO2: 15 mmol/L — ABNORMAL LOW (ref 22–32)
Calcium: 9.5 mg/dL (ref 8.9–10.3)
Chloride: 108 mmol/L (ref 98–111)
Creatinine, Ser: 1.52 mg/dL — ABNORMAL HIGH (ref 0.44–1.00)
GFR calc Af Amer: 42 mL/min — ABNORMAL LOW (ref >60)
GFR calc non Af Amer: 36 mL/min — ABNORMAL LOW (ref >60)
Glucose, Bld: 114 mg/dL — ABNORMAL HIGH (ref 70–99)
Potassium: 2.8 mmol/L — ABNORMAL LOW (ref 3.5–5.1)
Sodium: 134 mmol/L — ABNORMAL LOW (ref 135–145)
Total Bilirubin: 5.6 mg/dL — ABNORMAL HIGH (ref 0.3–1.2)
Total Protein: 6 g/dL — ABNORMAL LOW (ref 6.5–8.1)

## 2018-10-30 LAB — CBC
HCT: 29.1 % — ABNORMAL LOW (ref 36.0–46.0)
Hemoglobin: 9.3 g/dL — ABNORMAL LOW (ref 12.0–15.0)
MCH: 30.6 pg (ref 26.0–34.0)
MCHC: 32 g/dL (ref 30.0–36.0)
MCV: 95.7 fL (ref 80.0–100.0)
Platelets: 83 10*3/uL — ABNORMAL LOW (ref 150–400)
RBC: 3.04 MIL/uL — ABNORMAL LOW (ref 3.87–5.11)
RDW: 18.3 % — ABNORMAL HIGH (ref 11.5–15.5)
WBC: 17.2 10*3/uL — ABNORMAL HIGH (ref 4.0–10.5)
nRBC: 0 % (ref 0.0–0.2)

## 2018-10-30 LAB — URINALYSIS, ROUTINE W REFLEX MICROSCOPIC
Bilirubin Urine: NEGATIVE
Glucose, UA: 50 mg/dL — AB
Ketones, ur: NEGATIVE mg/dL
Nitrite: NEGATIVE
Protein, ur: 100 mg/dL — AB
Specific Gravity, Urine: 1.011 (ref 1.005–1.030)
WBC, UA: >50 WBC/hpf — ABNORMAL HIGH (ref 0–5)
pH: 5 (ref 5.0–8.0)

## 2018-10-30 LAB — BLOOD GAS, VENOUS
Acid-base deficit: 7 mmol/L — ABNORMAL HIGH (ref 0.0–2.0)
Bicarbonate: 16.9 mmol/L — ABNORMAL LOW (ref 20.0–28.0)
O2 Saturation: 59.8 %
Patient temperature: 98.6
pCO2, Ven: 30.5 mmHg — ABNORMAL LOW (ref 44.0–60.0)
pH, Ven: 7.361 (ref 7.250–7.430)
pO2, Ven: 35.2 mmHg (ref 32.0–45.0)

## 2018-10-30 LAB — LACTIC ACID, PLASMA
Lactic Acid, Venous: 2.6 mmol/L (ref 0.5–1.9)
Lactic Acid, Venous: 3.5 mmol/L (ref 0.5–1.9)

## 2018-10-30 LAB — LIPASE, BLOOD: Lipase: 30 U/L (ref 11–51)

## 2018-10-30 MED ORDER — SODIUM CHLORIDE 0.9 % IV BOLUS
1000.0000 mL | Freq: Once | INTRAVENOUS | Status: AC
  Administered 2018-10-30: 1000 mL via INTRAVENOUS

## 2018-10-30 MED ORDER — POTASSIUM CHLORIDE 10 MEQ/100ML IV SOLN
10.0000 meq | Freq: Once | INTRAVENOUS | Status: AC
  Administered 2018-10-30: 10 meq via INTRAVENOUS
  Filled 2018-10-30: qty 100

## 2018-10-30 MED ORDER — ONDANSETRON HCL 4 MG/2ML IJ SOLN
4.0000 mg | Freq: Once | INTRAMUSCULAR | Status: AC
  Administered 2018-10-30: 4 mg via INTRAVENOUS
  Filled 2018-10-30: qty 2

## 2018-10-30 MED ORDER — SODIUM CHLORIDE 0.9 % IV SOLN
1.0000 g | Freq: Once | INTRAVENOUS | Status: AC
  Administered 2018-10-30: 1 g via INTRAVENOUS
  Filled 2018-10-30: qty 1

## 2018-10-30 MED ORDER — POTASSIUM CHLORIDE CRYS ER 20 MEQ PO TBCR
40.0000 meq | EXTENDED_RELEASE_TABLET | Freq: Once | ORAL | Status: AC
  Administered 2018-10-30: 40 meq via ORAL
  Filled 2018-10-30: qty 2

## 2018-10-30 MED ORDER — SODIUM CHLORIDE 0.9 % IV BOLUS
500.0000 mL | Freq: Once | INTRAVENOUS | Status: AC
  Administered 2018-10-31: 500 mL via INTRAVENOUS

## 2018-10-30 MED ORDER — SODIUM CHLORIDE 0.9% FLUSH
3.0000 mL | Freq: Once | INTRAVENOUS | Status: AC
  Administered 2018-10-30: 3 mL via INTRAVENOUS

## 2018-10-30 NOTE — ED Notes (Signed)
Pt stating she still does not feel like eating, this nurse encouraged to try and take small sips of water or bites of a saltine cracker and provided them to patient

## 2018-10-30 NOTE — ED Notes (Signed)
Date and time results received: 10/30/18 7:04 PM    Test: lactic acid Critical Value: 2.6  Name of Provider Notified: Eulis Foster MD  Orders Received? Or Actions Taken?: acknowledges result

## 2018-10-30 NOTE — ED Notes (Signed)
I tried to get vitals in the lobby patient has on a lot of clothes and pillow I could not get to her arm out there

## 2018-10-30 NOTE — ED Triage Notes (Signed)
Patient states she was discharged from the hospital 5 days ago for SIRS/Sepsis. Today the patient c/o fever and emesis.

## 2018-10-30 NOTE — Progress Notes (Signed)
A consult was received from an ED physician for meropenem (health system ESBL agent)  per pharmacy dosing.  The patient's profile has been reviewed for ht/wt/allergies/indication/available labs.   A one time order has been placed for meropenem 1gm iv x1.  Further antibiotics/pharmacy consults should be ordered by admitting physician if indicated.                       Thank you, Nani Skillern Crowford 10/30/2018  11:24 PM

## 2018-10-30 NOTE — ED Notes (Signed)
Urine sample contaminated with stool, will obtain later

## 2018-10-30 NOTE — ED Provider Notes (Signed)
[facility] DEPT Provider Note   CSN: [id number] Arrival date & time: 10/30/18  1420     History   Chief Complaint Chief Complaint  Patient presents with   Fever   Emesis    HPI Dana Watkins is a 63 y.o. female.     HPI   She presents for evaluation of fever.  She was discharged from the hospital, 5 days ago after admission for SIRS with ultimate diagnosis of UTI/bacteremia from ESBL E. coli.  Hospital course complicated by diarrhea, hypokalemia, hypophosphatemia, abnormal LFTs, and prolonged hospitalization related to inability to afford complex antibiotic regimen.  Patient was evaluated and treated for possible poisoning, and felt to not have that.  She describes an illness present for 2 days characterized by nausea, vomiting, inability to eat or drink anything, and general malaise.  She is not coughing, sneezing and denies sore throat.  No known recent sick contacts.  She came here by private vehicle for evaluation.  She has not seen her PCP for follow-up since her recent hospitalization.  There are no other known modifying factors.  Past Medical History:  Diagnosis Date   Acute hypokalemia 11/26/2014   Arthritis 05/15/2015   Benign essential hypertension 05/15/2015   C. difficile colitis    CAD (coronary artery disease)    a. cath 04/2017: ""Diffuse, calcific CAD particularly in the distal RCA and proximal to mid LAD.  LAD disease is nonobstructive.  RCA disease is more severe but does not appear significant.  Given her lack of symptoms, would pursue medical therapy."   CKD (chronic kidney disease), stage III    Hypokalemia    Migraine headache 05/15/2015   Mild mitral regurgitation    Pancytopenia ([place])    Sepsis ([place]) 01/2017   Thrombocytopenia ([place])    a. chronic thrombocytopenia ([facility] - remotely saw hematology).   Vitamin D deficiency     Patient Active Problem List   Diagnosis Date Noted   Normocytic anemia 10/23/2018    SIRS (systemic inflammatory response syndrome) ([place]) 10/16/2018   CKD (chronic kidney disease), stage III 10/16/2018   Acute lower UTI 10/16/2018   Elevated troponin 10/16/2018   Elevated d-dimer 10/16/2018   UTI (urinary tract infection) 06/25/2018   Community acquired pneumonia of right middle lobe of lung 06/09/2018   AKI (acute kidney injury) ([place]) 06/09/2018   GAD (generalized anxiety disorder) 11/06/2017   Abnormal findings on diagnostic imaging of heart and coronary circulation    Family history of coronary artery disease 05/09/2017   DOE (dyspnea on exertion) 05/09/2017   Coronary artery calcification seen on CT scan 03/08/2017   Acute bronchitis 02/06/2017   Dehydration    Diarrhea 02/03/2017   C. difficile colitis 02/03/2017   Arthritis 05/15/2015   Benign essential hypertension 05/15/2015   Migraine headache 05/15/2015   Hypokalemia 11/26/2014   Elevated alkaline phosphatase level 11/26/2014   Abnormal weight gain 11/26/2014   Chronic leukopenia 05/28/2014   Anemia of chronic illness 05/28/2014   Thrombocytopenia ([place]) 04/30/2013   Elevated liver function tests 04/30/2013    Past Surgical History:  Procedure Laterality Date   CESAREAN SECTION     24 years ago   LEFT HEART CATH AND CORONARY ANGIOGRAPHY N/A 05/12/2017   Procedure: LEFT HEART CATH AND CORONARY ANGIOGRAPHY;  Surgeon: [name] Booze, MD;  Location: [facility];  Service: Cardiovascular;  Laterality: N/A;   tonsils and adneoids     as a child   ULTRASOUND GUIDANCE FOR  VASCULAR ACCESS  05/12/2017   Procedure: Ultrasound Guidance For Vascular Access;  Surgeon: [name] Booze, MD;  Location: [facility];  Service: Cardiovascular;;     OB History   No obstetric history on file.      Home Medications    Prior to Admission medications   Medication Sig Start Date End Date Taking? Authorizing Provider  Cholecalciferol (VITAMIN D3) 2000 units TABS  Take 1 tablet by mouth daily.   Yes [provider]  diclofenac sodium (VOLTAREN) 1 % GEL Apply 2 g topically 4 (four) times daily. 10/24/18  Yes Sheikh, Omair Latif, DO  folic acid (FOLVITE) 1 MG tablet Take 1 tablet (1 mg total) by mouth daily. 06/29/18  Yes Sheikh, Omair Latif, DO  KLOR-CON M20 20 MEQ tablet Take 40 mEq by mouth 2 (two) times a day. 06/22/18  Yes [provider]  MAGNESIUM PO Take 1 tablet by mouth daily.    Yes [provider]  Multiple Vitamin (MULTIVITAMIN WITH MINERALS) TABS tablet Take 1 tablet by mouth daily. 06/29/18  Yes Sheikh, Omair Latif, DO  ondansetron (ZOFRAN) 4 MG tablet Take 1 tablet (4 mg total) by mouth every 6 (six) hours as needed for nausea. 10/24/18  Yes Sheikh, Omair Latif, DO  saccharomyces boulardii (FLORASTOR) 250 MG capsule Take 1 capsule (250 mg total) by mouth 2 (two) times daily. 06/28/18  Yes Sheikh, Omair Latif, DO  thiamine 100 MG tablet Take 1 tablet (100 mg total) by mouth daily. 06/29/18  Yes Sheikh, Omair Latif, DO  sodium bicarbonate 650 MG tablet Take 1 tablet (650 mg total) by mouth 2 (two) times daily. Patient not taking: Reported on 10/30/2018 06/28/18   Kerney Elbe, DO    Family History Family History  Problem Relation Age of Onset   Pernicious anemia Maternal Grandfather    Heart attack Maternal Grandfather    Atrial fibrillation Mother    Supraventricular tachycardia Father    Heart disease Paternal Grandfather     Social History Social History   Tobacco Use   Smoking status: Former Smoker    Years: 2.00    Start date: 06/25/1968    Quit date: 05/16/2014    Years since quitting: 4.4   Smokeless tobacco: Never Used  Substance Use Topics   Alcohol use: Yes    Alcohol/week: 1.0 standard drinks    Types: 1 Glasses of wine per week    Comment: occ   Drug use: No     Allergies   Sulfa antibiotics   Review of Systems Review of Systems  All other systems reviewed and are  negative.    Physical Exam Updated Vital Signs BP 100/65    Pulse 81    Temp 100 F (37.8 C) (Oral)    Resp 16    Ht 5' 8"  (1.727 m)    Wt 59 kg    SpO2 100%    BMI 19.77 kg/m   Physical Exam Vitals signs and nursing note reviewed.  Constitutional:      Appearance: She is well-developed. She is not ill-appearing.  HENT:     Head: Normocephalic and atraumatic.     Right Ear: External ear normal.     Left Ear: External ear normal.  Eyes:     Conjunctiva/sclera: Conjunctivae normal.     Pupils: Pupils are equal, round, and reactive to light.  Neck:     Musculoskeletal: Normal range of motion and neck supple.     Trachea: Phonation  normal.  Cardiovascular:     Rate and Rhythm: Normal rate.  Pulmonary:     Effort: Pulmonary effort is normal.  Abdominal:     General: Abdomen is flat.  Musculoskeletal: Normal range of motion.  Skin:    General: Skin is warm and dry.  Neurological:     Mental Status: She is alert and oriented to person, place, and time.     Cranial Nerves: No cranial nerve deficit.     Sensory: No sensory deficit.     Motor: No abnormal muscle tone.     Coordination: Coordination normal.  Psychiatric:        Mood and Affect: Mood normal.        Behavior: Behavior normal.        Thought Content: Thought content normal.        Judgment: Judgment normal.      ED Treatments / Results  Labs (all labs ordered are listed, but only abnormal results are displayed) Labs Reviewed  COMPREHENSIVE METABOLIC PANEL - Abnormal; Notable for the following components:      Result Value   Sodium 134 (*)    Potassium 2.8 (*)    CO2 15 (*)    Glucose, Bld 114 (*)    Creatinine, Ser 1.52 (*)    Total Protein 6.0 (*)    Albumin 2.7 (*)    AST 71 (*)    Alkaline Phosphatase 176 (*)    Total Bilirubin 5.6 (*)    GFR calc non Af Amer 36 (*)    GFR calc Af Amer 42 (*)    All other components within normal limits  CBC - Abnormal; Notable for the following components:    WBC 17.2 (*)    RBC 3.04 (*)    Hemoglobin 9.3 (*)    HCT 29.1 (*)    RDW 18.3 (*)    Platelets 83 (*)    All other components within normal limits  URINALYSIS, ROUTINE W REFLEX MICROSCOPIC - Abnormal; Notable for the following components:   APPearance CLOUDY (*)    Glucose, UA 50 (*)    Hgb urine dipstick MODERATE (*)    Protein, ur 100 (*)    Leukocytes,Ua LARGE (*)    WBC, UA >50 (*)    Bacteria, UA MANY (*)    All other components within normal limits  BLOOD GAS, VENOUS - Abnormal; Notable for the following components:   pCO2, Ven 30.5 (*)    Bicarbonate 16.9 (*)    Acid-base deficit 7.0 (*)    All other components within normal limits  LACTIC ACID, PLASMA - Abnormal; Notable for the following components:   Lactic Acid, Venous 2.6 (*)    All other components within normal limits  LACTIC ACID, PLASMA - Abnormal; Notable for the following components:   Lactic Acid, Venous 3.5 (*)    All other components within normal limits  CULTURE, BLOOD (ROUTINE X 2)  CULTURE, BLOOD (ROUTINE X 2)  SARS CORONAVIRUS 2 (TAT 6-24 HRS)  URINE CULTURE  LIPASE, BLOOD    EKG None  Radiology Dg Chest Port 1 View  Result Date: 10/30/2018 CLINICAL DATA:  Fever EXAM: PORTABLE CHEST 1 VIEW COMPARISON:  10/19/2018 FINDINGS: Cardiomegaly. Both lungs are clear. The visualized skeletal structures are unremarkable. IMPRESSION: Cardiomegaly without acute abnormality of the lungs in AP portable projection. Electronically Signed   By: Eddie Candle M.D.   On: 10/30/2018 18:29    Procedures .Critical Care Performed by: Daleen Bo, MD  Authorized by: Daleen Bo, MD   Critical care provider statement:    Critical care time (minutes):  35   Critical care start time:  10/30/2018 5:35 PM   Critical care end time:  10/30/2018 11:37 PM   Critical care time was exclusive of:  Separately billable procedures and treating other patients   Critical care was necessary to treat or prevent imminent or  life-threatening deterioration of the following conditions:  Sepsis   Critical care was time spent personally by me on the following activities:  Blood draw for specimens, development of treatment plan with patient or surrogate, discussions with consultants, evaluation of patient's response to treatment, examination of patient, obtaining history from patient or surrogate, ordering and performing treatments and interventions, ordering and review of laboratory studies, pulse oximetry, re-evaluation of patient's condition, review of old charts and ordering and review of radiographic studies   (including critical care time)  Medications Ordered in ED Medications  meropenem (MERREM) 1 g in sodium chloride 0.9 % 100 mL IVPB (1 g Intravenous New Bag/Given 10/30/18 2332)  sodium chloride 0.9 % bolus 500 mL (has no administration in time range)  sodium chloride flush (NS) 0.9 % injection 3 mL (3 mLs Intravenous Given 10/30/18 1939)  sodium chloride 0.9 % bolus 1,000 mL (0 mLs Intravenous Stopped 10/30/18 2045)  ondansetron (ZOFRAN) injection 4 mg (4 mg Intravenous Given 10/30/18 1938)  potassium chloride SA (KLOR-CON) CR tablet 40 mEq (40 mEq Oral Given 10/30/18 2147)  potassium chloride 10 mEq in 100 mL IVPB (0 mEq Intravenous Stopped 10/30/18 2249)  sodium chloride 0.9 % bolus 1,000 mL (0 mLs Intravenous Stopped 10/30/18 2258)     Initial Impression / Assessment and Plan / ED Course  I have reviewed the triage vital signs and the nursing notes.  Pertinent labs & imaging results that were available during my care of the patient were reviewed by me and considered in my medical decision making (see chart for details).  Clinical Course as of Oct 29 2344  Mon Oct 30, 2018  1744 Patient with fever recently treated for UTI, but with suspected false negative Covid at that time.  Symptoms now are primarily fever with vomiting.  No cough or other respiratory symptoms.  Low to moderate risk for COVID-19  infection.  Screening labs and testing ordered.  No overt sepsis/SIRS on arrival.   [EW]  2114 Normal except sodium low, potassium low, CO2 low, glucose high, creatinine high, total protein high, albumin low, AST high, alkaline phosphatase high, total bilirubin high, GFR low  Comprehensive metabolic panel(!) [EW]  1537 Mild elevation  Lactic acid, plasma(!!) [EW]  2117 Normal  Lipase, blood [EW]  2117 Normal except PCO2 low, bicarb low, acid-base high  Blood gas, venous(!) [EW]  2118 Normal except white count high, hemoglobin low, platelets low  CBC(!) [EW]  2119 Potassium(!): 2.8 [EW]  2119 Acid-base deficit(!): 7.0 [EW]  2331 Case was discussed with hospital pharmacist, we agreed to start meropenem, for suspected ESBL E. coli infection.   [EW]    Clinical Course User Index [EW] Daleen Bo, MD        Patient Vitals for the past 24 hrs:  BP Temp Temp src Pulse Resp SpO2 Height Weight  10/30/18 2300 100/65 -- -- 81 16 100 % -- --  10/30/18 2230 100/63 -- -- 81 17 100 % -- --  10/30/18 2200 115/70 -- -- 86 17 98 % -- --  10/30/18 2137 (!) 96/56 -- -- 85 15  98 % -- --  10/30/18 2100 101/60 -- -- 84 17 100 % -- --  10/30/18 2030 115/62 -- -- 90 16 99 % -- --  10/30/18 2015 (!) 111/53 -- -- 88 -- 99 % -- --  10/30/18 2001 127/62 -- -- 83 17 99 % -- --  10/30/18 2000 127/62 -- -- 94 -- 95 % -- --  10/30/18 1930 (!) 101/52 -- -- 91 18 98 % -- --  10/30/18 1915 -- -- -- 92 -- 99 % -- --  10/30/18 1900 (!) 100/51 -- -- 91 -- 100 % -- --  10/30/18 1859 (!) 97/46 -- -- 90 15 100 % -- --  10/30/18 1836 (!) 97/46 -- -- 87 -- 100 % -- --  10/30/18 1809 -- -- -- 89 -- 99 % -- --  10/30/18 1808 (!) 114/50 -- -- -- -- -- -- --  10/30/18 1523 -- -- -- -- -- -- 5' 8"  (1.727 m) 59 kg  10/30/18 1519 129/64 100 F (37.8 C) Oral 88 18 99 % -- --    11:40 PM Reevaluation with update and discussion. After initial assessment and treatment, an updated evaluation reveals no change in clinical  status.  She states she has had loose bowels yesterday and today x1 each, but they were formed, not liquid.  Findings discussed and questions answered. Daleen Bo   Medical Decision Making: Patient with decreased oral intake, malaise and findings consistent with significant volume depletion.  Metabolic panel is most consistently explained by dehydration.  Incidental hypokalemia will require treatment.  White count and hemoglobin abnormalities are consistent with dehydration.  Nonspecific initial lactate elevation, without clinical signs for acute bacterial infection.  Dana Watkins was evaluated in Emergency Department on 10/30/2018 for the symptoms described in the history of present illness. She was evaluated in the context of the global COVID-19 pandemic, which necessitated consideration that the patient might be at risk for infection with the SARS-CoV-2 virus that causes COVID-19. Institutional protocols and algorithms that pertain to the evaluation of patients at risk for COVID-19 are in a state of rapid change based on information released by regulatory bodies including the CDC and federal and state organizations. These policies and algorithms were followed during the patient's care in the ED.   CRITICAL CARE- yes Performed by: Daleen Bo  Nursing Notes Reviewed/ Care Coordinated Applicable Imaging Reviewed Interpretation of Laboratory Data incorporated into ED treatment  11:36 PM-Consult complete with hospitalist. Patient case explained and discussed.  He agrees to admit patient for further evaluation and treatment. Call ended at 11:45 PM  Plan: Admit  Final Clinical Impressions(s) / ED Diagnoses   Final diagnoses:  Sepsis due to Escherichia coli, unspecified whether acute organ dysfunction present ([place])  AKI (acute kidney injury) ([place])  Dehydration  Hypokalemia  Diarrhea, unspecified type    ED Discharge Orders    None       Daleen Bo, MD 10/30/18 2346

## 2018-10-31 ENCOUNTER — Inpatient Hospital Stay (HOSPITAL_COMMUNITY): Payer: Medicare Other

## 2018-10-31 ENCOUNTER — Other Ambulatory Visit: Payer: Self-pay

## 2018-10-31 ENCOUNTER — Encounter (HOSPITAL_COMMUNITY): Payer: Self-pay | Admitting: Family Medicine

## 2018-10-31 DIAGNOSIS — R011 Cardiac murmur, unspecified: Secondary | ICD-10-CM | POA: Diagnosis present

## 2018-10-31 DIAGNOSIS — R748 Abnormal levels of other serum enzymes: Secondary | ICD-10-CM

## 2018-10-31 DIAGNOSIS — K7460 Unspecified cirrhosis of liver: Secondary | ICD-10-CM | POA: Diagnosis present

## 2018-10-31 DIAGNOSIS — I340 Nonrheumatic mitral (valve) insufficiency: Secondary | ICD-10-CM

## 2018-10-31 DIAGNOSIS — K7469 Other cirrhosis of liver: Secondary | ICD-10-CM

## 2018-10-31 DIAGNOSIS — I361 Nonrheumatic tricuspid (valve) insufficiency: Secondary | ICD-10-CM

## 2018-10-31 DIAGNOSIS — A0472 Enterocolitis due to Clostridium difficile, not specified as recurrent: Secondary | ICD-10-CM | POA: Diagnosis present

## 2018-10-31 DIAGNOSIS — N3000 Acute cystitis without hematuria: Secondary | ICD-10-CM

## 2018-10-31 LAB — CBC WITH DIFFERENTIAL/PLATELET
Abs Immature Granulocytes: 0.41 10*3/uL — ABNORMAL HIGH (ref 0.00–0.07)
Basophils Absolute: 0 10*3/uL (ref 0.0–0.1)
Basophils Relative: 0 %
Eosinophils Absolute: 0 10*3/uL (ref 0.0–0.5)
Eosinophils Relative: 0 %
HCT: 29.1 % — ABNORMAL LOW (ref 36.0–46.0)
Hemoglobin: 9.1 g/dL — ABNORMAL LOW (ref 12.0–15.0)
Immature Granulocytes: 2 %
Lymphocytes Relative: 6 %
Lymphs Abs: 1.1 10*3/uL (ref 0.7–4.0)
MCH: 30.3 pg (ref 26.0–34.0)
MCHC: 31.3 g/dL (ref 30.0–36.0)
MCV: 97 fL (ref 80.0–100.0)
Monocytes Absolute: 1 10*3/uL (ref 0.1–1.0)
Monocytes Relative: 5 %
Neutro Abs: 16.2 10*3/uL — ABNORMAL HIGH (ref 1.7–7.7)
Neutrophils Relative %: 87 %
Platelets: 34 10*3/uL — ABNORMAL LOW (ref 150–400)
RBC: 3 MIL/uL — ABNORMAL LOW (ref 3.87–5.11)
RDW: 18.5 % — ABNORMAL HIGH (ref 11.5–15.5)
WBC: 18.7 10*3/uL — ABNORMAL HIGH (ref 4.0–10.5)
nRBC: 0 % (ref 0.0–0.2)

## 2018-10-31 LAB — LACTIC ACID, PLASMA
Lactic Acid, Venous: 2.6 mmol/L (ref 0.5–1.9)
Lactic Acid, Venous: 2.4 mmol/L (ref 0.5–1.9)
Lactic Acid, Venous: 2.4 mmol/L (ref 0.5–1.9)

## 2018-10-31 LAB — COMPREHENSIVE METABOLIC PANEL
ALT: 23 U/L (ref 0–44)
AST: 62 U/L — ABNORMAL HIGH (ref 15–41)
Albumin: 2.3 g/dL — ABNORMAL LOW (ref 3.5–5.0)
Alkaline Phosphatase: 165 U/L — ABNORMAL HIGH (ref 38–126)
Anion gap: 7 (ref 5–15)
BUN: 16 mg/dL (ref 8–23)
CO2: 16 mmol/L — ABNORMAL LOW (ref 22–32)
Calcium: 8.7 mg/dL — ABNORMAL LOW (ref 8.9–10.3)
Chloride: 115 mmol/L — ABNORMAL HIGH (ref 98–111)
Creatinine, Ser: 1.37 mg/dL — ABNORMAL HIGH (ref 0.44–1.00)
GFR calc Af Amer: 48 mL/min — ABNORMAL LOW (ref >60)
GFR calc non Af Amer: 41 mL/min — ABNORMAL LOW (ref >60)
Glucose, Bld: 110 mg/dL — ABNORMAL HIGH (ref 70–99)
Potassium: 3.9 mmol/L (ref 3.5–5.1)
Sodium: 138 mmol/L (ref 135–145)
Total Bilirubin: 5 mg/dL — ABNORMAL HIGH (ref 0.3–1.2)
Total Protein: 5.4 g/dL — ABNORMAL LOW (ref 6.5–8.1)

## 2018-10-31 LAB — C DIFFICILE QUICK SCREEN W PCR REFLEX
C Diff antigen: POSITIVE — AB
C Diff interpretation: DETECTED
C Diff toxin: POSITIVE — AB

## 2018-10-31 LAB — ECHOCARDIOGRAM COMPLETE
Height: 68 in
Weight: 2080 oz

## 2018-10-31 LAB — PROTIME-INR
INR: 1.9 — ABNORMAL HIGH (ref 0.8–1.2)
Prothrombin Time: 21.7 seconds — ABNORMAL HIGH (ref 11.4–15.2)

## 2018-10-31 LAB — SARS CORONAVIRUS 2 (TAT 6-24 HRS): SARS Coronavirus 2: NEGATIVE

## 2018-10-31 LAB — MAGNESIUM: Magnesium: 1.9 mg/dL (ref 1.7–2.4)

## 2018-10-31 LAB — CBG MONITORING, ED: Glucose-Capillary: 101 mg/dL — ABNORMAL HIGH (ref 70–99)

## 2018-10-31 LAB — MRSA PCR SCREENING: MRSA by PCR: NEGATIVE

## 2018-10-31 MED ORDER — HYDROCODONE-ACETAMINOPHEN 5-325 MG PO TABS: 1.0000 | ORAL_TABLET | ORAL | Status: DC | PRN

## 2018-10-31 MED ORDER — VANCOMYCIN 50 MG/ML ORAL SOLUTION
125.0000 mg | Freq: Four times a day (QID) | ORAL | Status: DC
  Administered 2018-10-31 – 2018-11-02 (×9): 125 mg via ORAL
  Filled 2018-10-31 (×14): qty 2.5

## 2018-10-31 MED ORDER — ADULT MULTIVITAMIN W/MINERALS CH
1.0000 | ORAL_TABLET | Freq: Every day | ORAL | Status: DC
  Administered 2018-10-31 – 2018-11-02 (×3): 1 via ORAL
  Filled 2018-10-31 (×3): qty 1

## 2018-10-31 MED ORDER — ONDANSETRON HCL 4 MG PO TABS: 4.0000 mg | ORAL_TABLET | Freq: Four times a day (QID) | ORAL | Status: DC | PRN

## 2018-10-31 MED ORDER — POTASSIUM CHLORIDE CRYS ER 20 MEQ PO TBCR
40.0000 meq | EXTENDED_RELEASE_TABLET | Freq: Every day | ORAL | Status: DC
  Administered 2018-10-31 – 2018-11-02 (×3): 40 meq via ORAL
  Filled 2018-10-31 (×3): qty 2

## 2018-10-31 MED ORDER — ONDANSETRON HCL 4 MG/2ML IJ SOLN: 4.0000 mg | Freq: Four times a day (QID) | INTRAMUSCULAR | Status: DC | PRN

## 2018-10-31 MED ORDER — SODIUM CHLORIDE 0.9 % IV SOLN
1.0000 g | Freq: Two times a day (BID) | INTRAVENOUS | Status: DC
  Administered 2018-10-31 – 2018-11-01 (×3): 1 g via INTRAVENOUS
  Filled 2018-10-31 (×5): qty 1

## 2018-10-31 MED ORDER — VANCOMYCIN 50 MG/ML ORAL SOLUTION: 125.0000 mg | Freq: Every day | ORAL | Status: DC

## 2018-10-31 MED ORDER — ACETAMINOPHEN 325 MG PO TABS: 650.0000 mg | ORAL_TABLET | Freq: Four times a day (QID) | ORAL | Status: DC | PRN

## 2018-10-31 MED ORDER — VITAMIN B-1 100 MG PO TABS
100.0000 mg | ORAL_TABLET | Freq: Every day | ORAL | Status: DC
  Administered 2018-10-31 – 2018-11-02 (×3): 100 mg via ORAL
  Filled 2018-10-31 (×3): qty 1

## 2018-10-31 MED ORDER — VANCOMYCIN 50 MG/ML ORAL SOLUTION: 125.0000 mg | Freq: Two times a day (BID) | ORAL | Status: DC

## 2018-10-31 MED ORDER — POTASSIUM CHLORIDE IN NACL 20-0.9 MEQ/L-% IV SOLN: INTRAVENOUS | Status: DC

## 2018-10-31 MED ORDER — VANCOMYCIN 50 MG/ML ORAL SOLUTION: 125.0000 mg | ORAL | Status: DC

## 2018-10-31 MED ORDER — FOLIC ACID 1 MG PO TABS
1.0000 mg | ORAL_TABLET | Freq: Every day | ORAL | Status: DC
  Administered 2018-10-31 – 2018-11-02 (×3): 1 mg via ORAL
  Filled 2018-10-31 (×3): qty 1

## 2018-10-31 MED ORDER — SACCHAROMYCES BOULARDII 250 MG PO CAPS
250.0000 mg | ORAL_CAPSULE | Freq: Two times a day (BID) | ORAL | Status: DC
  Administered 2018-10-31 – 2018-11-02 (×5): 250 mg via ORAL
  Filled 2018-10-31 (×6): qty 1

## 2018-10-31 MED ORDER — LACTATED RINGERS IV SOLN
INTRAVENOUS | Status: AC
  Administered 2018-10-31 (×2): via INTRAVENOUS

## 2018-10-31 MED ORDER — ACETAMINOPHEN 650 MG RE SUPP: 650.0000 mg | Freq: Four times a day (QID) | RECTAL | Status: DC | PRN

## 2018-10-31 MED ORDER — SODIUM CHLORIDE 0.9% FLUSH
3.0000 mL | Freq: Two times a day (BID) | INTRAVENOUS | Status: DC
  Administered 2018-10-31 – 2018-11-01 (×3): 3 mL via INTRAVENOUS

## 2018-10-31 MED ORDER — SODIUM BICARBONATE 650 MG PO TABS
650.0000 mg | ORAL_TABLET | Freq: Two times a day (BID) | ORAL | Status: DC
  Administered 2018-10-31 – 2018-11-01 (×3): 650 mg via ORAL
  Filled 2018-10-31 (×4): qty 1

## 2018-10-31 NOTE — ED Notes (Signed)
Ultrasound at bedside

## 2018-10-31 NOTE — Progress Notes (Signed)
  Echocardiogram 2D Echocardiogram has been performed.  Dana Watkins M 10/31/2018, 1:22 PM

## 2018-10-31 NOTE — ED Notes (Signed)
Pt incontinent with stool. Tech and I changed bed linens. Placed pt in adult brief.

## 2018-10-31 NOTE — ED Notes (Signed)
Date and time results received: 10/31/18 6:01 AM (use smartphrase ".now" to insert current time)  Test: C-diff Critical Value: Positive  Name of Provider Notified:    Orders Received? Or Actions Taken?: Orders Received - See Orders for details and

## 2018-10-31 NOTE — ED Notes (Addendum)
hospitalist at bedside

## 2018-10-31 NOTE — H&P (Signed)
History and Physical    Dana Watkins TDV:[id number] DOB: Sep 24, 1955 DOA: 10/30/2018  PCP: Lawerance Cruel, MD   Patient coming from: Home   Chief Complaint: Fevers, N/V   HPI: Dana Watkins is a 63 y.o. female with medical history significant for coronary artery disease, chronic kidney disease stage III, chronic thrombocytopenia, chronic anemia, and hospital admission earlier this month for UTI with ESBL bacteremia, now returning with recurrent fevers, nausea, and vomiting.  Patient was discharged from the hospital on 10/24/2018 after being treated with meropenem for 7 days.  At time of discharge, she was no longer having fevers, leukocytosis had resolved, and pro calcitonin had gone down considerably.  She continued to do fairly well back at home until yesterday when she developed fevers, nausea, and nonbloody vomiting.  She denies any abdominal or flank pain.  She denies chest pain.  She denies shortness of breath or new cough.  ED Course: Upon arrival to the ED, patient is found to have a temperature of 37.8 C, saturating mid 90s on room air, and with blood pressure 95/61.  Chest x-ray is negative for acute findings.  Chemistry panel notable for potassium 2.8, bicarbonate 15, total bilirubin 5.6, and creatinine 1.52, up from 1.07 a few days earlier.  CBC is notable for new leukocytosis 17,900 and stable normocytic anemia, and stable thrombocytopenia.  Lactic acid is elevated to 2.6.  Patient was given 2.5 L of IV fluids, oral and IV potassium, Zofran, and meropenem in the ED.  COVID-19 testing has returned negative.  Review of Systems:  All other systems reviewed and apart from HPI, are negative.  Past Medical History:  Diagnosis Date  . Acute hypokalemia 11/26/2014  . Arthritis 05/15/2015  . Benign essential hypertension 05/15/2015  . C. difficile colitis   . CAD (coronary artery disease)    a. cath 04/2017: ""Diffuse, calcific CAD particularly in the distal RCA and proximal to mid  LAD.  LAD disease is nonobstructive.  RCA disease is more severe but does not appear significant.  Given her lack of symptoms, would pursue medical therapy."  . CKD (chronic kidney disease), stage III   . Hypokalemia   . Migraine headache 05/15/2015  . Mild mitral regurgitation   . Pancytopenia ([place])   . Sepsis ([place]) 01/2017  . Thrombocytopenia ([place])    a. chronic thrombocytopenia (ITP - remotely saw hematology).  . Vitamin D deficiency     Past Surgical History:  Procedure Laterality Date  . CESAREAN SECTION     24 years ago  . LEFT HEART CATH AND CORONARY ANGIOGRAPHY N/A 05/12/2017   Procedure: LEFT HEART CATH AND CORONARY ANGIOGRAPHY;  Surgeon: [name] Booze, MD;  Location: [facility];  Service: Cardiovascular;  Laterality: N/A;  . tonsils and adneoids     as a child  . ULTRASOUND GUIDANCE FOR VASCULAR ACCESS  05/12/2017   Procedure: Ultrasound Guidance For Vascular Access;  Surgeon: [name] Booze, MD;  Location: [facility];  Service: Cardiovascular;;     reports that she quit smoking about 4 years ago. She started smoking about 50 years ago. She quit after 2.00 years of use. She has never used smokeless tobacco. She reports current alcohol use of about 1.0 standard drinks of alcohol per week. She reports that she does not use drugs.  Allergies  Allergen Reactions  . Sulfa Antibiotics Nausea And Vomiting and Rash    Family History  Problem Relation Age of Onset  . Pernicious  anemia Maternal Grandfather   . Heart attack Maternal Grandfather   . Atrial fibrillation Mother   . Supraventricular tachycardia Father   . Heart disease Paternal Grandfather      Prior to Admission medications   Medication Sig Start Date End Date Taking? Authorizing Provider  Cholecalciferol (VITAMIN D3) 2000 units TABS Take 1 tablet by mouth daily.   Yes [provider]  diclofenac sodium (VOLTAREN) 1 % GEL Apply 2 g topically 4 (four) times daily. 10/24/18  Yes  Sheikh, Omair Latif, DO  folic acid (FOLVITE) 1 MG tablet Take 1 tablet (1 mg total) by mouth daily. 06/29/18  Yes Sheikh, Omair Latif, DO  KLOR-CON M20 20 MEQ tablet Take 40 mEq by mouth 2 (two) times a day. 06/22/18  Yes [provider]  MAGNESIUM PO Take 1 tablet by mouth daily.    Yes [provider]  Multiple Vitamin (MULTIVITAMIN WITH MINERALS) TABS tablet Take 1 tablet by mouth daily. 06/29/18  Yes Sheikh, Omair Latif, DO  ondansetron (ZOFRAN) 4 MG tablet Take 1 tablet (4 mg total) by mouth every 6 (six) hours as needed for nausea. 10/24/18  Yes Sheikh, Omair Latif, DO  saccharomyces boulardii (FLORASTOR) 250 MG capsule Take 1 capsule (250 mg total) by mouth 2 (two) times daily. 06/28/18  Yes Sheikh, Omair Latif, DO  thiamine 100 MG tablet Take 1 tablet (100 mg total) by mouth daily. 06/29/18  Yes Sheikh, Omair Latif, DO  sodium bicarbonate 650 MG tablet Take 1 tablet (650 mg total) by mouth 2 (two) times daily. Patient not taking: Reported on 10/30/2018 06/28/18   Kerney Elbe, DO    Physical Exam: Vitals:   10/31/18 0130 10/31/18 0200 10/31/18 0230 10/31/18 0300  BP: (!) 97/58 100/61 102/66 101/62  Pulse: 78 79 83 74  Resp: 16 17 16 17   Temp:      TempSrc:      SpO2: 99% 99% 98% 100%  Weight:      Height:        Constitutional: NAD, calm  Eyes: PERTLA, lids and conjunctivae normal ENMT: Mucous membranes are moist. Posterior pharynx clear of any exudate or lesions.   Neck: normal, supple, no masses, no thyromegaly Respiratory: no wheezing, no crackles. No accessory muscle use.  Cardiovascular: S1 & S2 heard, regular rate and rhythm. No extremity edema. Abdomen: No distension, no tenderness, soft. Bowel sounds active.  Musculoskeletal: no clubbing / cyanosis. No joint deformity upper and lower extremities.    Skin: no significant rashes, lesions, ulcers. Warm, dry, well-perfused. Neurologic: No facial asymmetry. Sensation intact. Moving all extremities.   Psychiatric: Alert and oriented to person, place, and situation. Calm, cooperative.    Labs on Admission: I have personally reviewed following labs and imaging studies  CBC: Recent Labs  Lab 10/24/18 0830 10/30/18 1810  WBC 5.3 17.2*  NEUTROABS 3.4  --   HGB 7.8* 9.3*  HCT 25.1* 29.1*  MCV 95.1 95.7  PLT 84* 83*   Basic Metabolic Panel: Recent Labs  Lab 10/24/18 0830 10/30/18 1810  NA 140 134*  K 3.4* 2.8*  CL 114* 108  CO2 19* 15*  GLUCOSE 88 114*  BUN 13 15  CREATININE 1.07* 1.52*  CALCIUM 8.6* 9.5  MG 2.3  --   PHOS 2.4*  --    GFR: Estimated Creatinine Clearance: 35.7 mL/min (A) (by C-G formula based on SCr of 1.52 mg/dL (H)). Liver Function Tests: Recent Labs  Lab 10/24/18 0830 10/30/18 1810  AST 58* 71*  ALT 24 28  ALKPHOS 205* 176*  BILITOT 2.9* 5.6*  PROT 5.1* 6.0*  ALBUMIN 2.1* 2.7*   Recent Labs  Lab 10/30/18 1810  LIPASE 30   No results for input(s): AMMONIA in the last 168 hours. Coagulation Profile: No results for input(s): INR, PROTIME in the last 168 hours. Cardiac Enzymes: No results for input(s): CKTOTAL, CKMB, CKMBINDEX, TROPONINI in the last 168 hours. BNP (last 3 results) No results for input(s): PROBNP in the last 8760 hours. HbA1C: No results for input(s): HGBA1C in the last 72 hours. CBG: No results for input(s): GLUCAP in the last 168 hours. Lipid Profile: No results for input(s): CHOL, HDL, LDLCALC, TRIG, CHOLHDL, LDLDIRECT in the last 72 hours. Thyroid Function Tests: No results for input(s): TSH, T4TOTAL, FREET4, T3FREE, THYROIDAB in the last 72 hours. Anemia Panel: No results for input(s): VITAMINB12, FOLATE, FERRITIN, TIBC, IRON, RETICCTPCT in the last 72 hours. Urine analysis:    Component Value Date/Time   COLORURINE YELLOW 10/30/2018 2156   APPEARANCEUR CLOUDY (A) 10/30/2018 2156   LABSPEC 1.011 10/30/2018 2156   PHURINE 5.0 10/30/2018 2156   GLUCOSEU 50 (A) 10/30/2018 2156   HGBUR MODERATE (A) 10/30/2018  2156   [place] NEGATIVE 10/30/2018 2156   [place] NEGATIVE 10/30/2018 2156   PROTEINUR 100 (A) 10/30/2018 2156   NITRITE NEGATIVE 10/30/2018 2156   LEUKOCYTESUR LARGE (A) 10/30/2018 2156   Sepsis Labs: @LABRCNTIP (CBSWHQPRFFMBW:4,YKZLDJTTSVX:7) ) Recent Results (from the past 240 hour(s))  SARS CORONAVIRUS 2 (TAT 6-24 HRS) Nasopharyngeal Nasopharyngeal Swab     Status: None   Collection Time: 10/30/18  6:35 PM   Specimen: Nasopharyngeal Swab  Result Value Ref Range Status   SARS Coronavirus 2 NEGATIVE NEGATIVE Final    Comment: (NOTE) SARS-CoV-2 target nucleic acids are NOT DETECTED. The SARS-CoV-2 RNA is generally detectable in upper and lower respiratory specimens during the acute phase of infection. Negative results do not preclude SARS-CoV-2 infection, do not rule out co-infections with other pathogens, and should not be used as the sole basis for treatment or other patient management decisions. Negative results must be combined with clinical observations, patient history, and epidemiological information. The expected result is Negative. Fact Sheet for Patients: SugarRoll.be Fact Sheet for Healthcare Providers: https://www.woods-mathews.com/ This test is not yet approved or cleared by the [place] FDA and  has been authorized for detection and/or diagnosis of SARS-CoV-2 by FDA under an Emergency Use Authorization (EUA). This EUA will remain  in effect (meaning this test can be used) for the duration of the COVID-19 declaration under Section 56 4(b)(1) of the Act, 21 U.S.C. section 360bbb-3(b)(1), unless the authorization is terminated or revoked sooner. Performed at [facility], [place] [address] [place], [place] [address]   Culture, blood (routine x 2)     Status: None (Preliminary result)   Collection Time: 10/30/18  7:56 PM   Specimen: BLOOD  Result Value Ref Range Status   Specimen Description   Final     BLOOD SITE NOT SPECIFIED Performed at [facility], [place] [address], [place], [place] [address]    Special Requests   Final    BOTTLES DRAWN AEROBIC AND ANAEROBIC Blood Culture results may not be optimal due to an inadequate volume of blood received in culture bottles Performed at [place] [address] [place], [place] [address]    Culture PENDING  Incomplete   Report Status PENDING  Incomplete     Radiological Exams on Admission: [place] Chest [facility]  1 View  Result Date: 10/30/2018 CLINICAL DATA:  Fever EXAM: PORTABLE CHEST 1 VIEW COMPARISON:  10/19/2018 FINDINGS: Cardiomegaly. Both lungs are clear. The visualized skeletal structures are unremarkable. IMPRESSION: Cardiomegaly without acute abnormality of the lungs in AP portable projection. Electronically Signed   By: Eddie Candle M.D.   On: 10/30/2018 18:29    EKG: Not performed.   Assessment/Plan   1. Sepsis; recent ESBL bacteremia  - Presents with fevers and N/V after being treated for ESBL UTI with bacteremia earlier this month  - Blood cultures were collected in ED and patient was started back on meropenum  - Continue meropenum, follow cultures and clinical course    2. Acute kidney injury superimposed on CKD III; acidosis   - SCr is 1.52 on admission, up from 1.07 a few days earlier  - Likely acute prerenal azotemia in setting of anorexia  - She was given 2.5 liters IVF in ED  - Renally-dose medications, avoid nephrotoxins, continue IVF hydration, continue bicarbonate, and repeat chem panel in am   3. Abnormal LFT's; suspected early cirrhosis  - Alk phos 176, albumin 2.7, AST 71, ALT 28, and total bilirubin 5.6 in ED  - Recent imaging was suggestive of early cirrhosis and she has been referred to GI  - Check INR, outpatient GI consultation recommended    4. Hypokalemia  - Serum potassium is 2.8 in ED, likely GI-losses  - Treated with oral and IV potassium in ED  - Repeat chem panel in am     5. Anemia; thrombocytopenia  - Counts appear stable with no bleeding  - She was given 1 unit of RBC during admission earlier this month  - She was previously evaluated by hematology  - Possibly related to liver disease and splenomegaly  - Monitor, continue nutritional supplements     PPE: Mask, face shield  DVT prophylaxis: SCD's  Code Status: Full  Family Communication: Discussed with patient  Consults called: None  Admission status: Inpatient. Mrs. Daniel recently had ESBL UTI with bacteremia, was treated with IV antibiotic in hospital, but never had negative blood cultures, now returns with fevers, meets sepsis criteria, and will likely need to be treated with IV antibiotics until blood cultures have resulted.    Vianne Bulls, MD [facility] Pager [phone]  If 7PM-7AM, please contact night-coverage [email or web address] Password [email or web address]  10/31/2018, 3:23 AM

## 2018-10-31 NOTE — ED Notes (Signed)
ECHO at bedside.
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
## 2018-10-31 NOTE — Progress Notes (Signed)
Pharmacy Antibiotic Note  Dana Watkins is a 63 y.o. female admitted on 10/30/2018 with C. Diff.  Pharmacy has been consulted for  Oral vancomycin dosing.  Plan: Oral vancomycin 164m PO qid with  6week taper. Consider increase dose to 5059min setting of shock, ileus, megacolon.  Height: 5' 8"  (172.7 cm) Weight: 130 lb (59 kg) IBW/kg (Calculated) : 63.9  Temp (24hrs), Avg:100 F (37.8 C), Min:100 F (37.8 C), Max:100 F (37.8 C)  Recent Labs  Lab 10/24/18 0830 10/30/18 1810 10/30/18 1956 10/31/18 0310 10/31/18 0322  WBC 5.3 17.2*  --   --  18.7*  CREATININE 1.07* 1.52*  --   --  1.37*  LATICACIDVEN  --  2.6* 3.5* 2.6*  --     Estimated Creatinine Clearance: 39.7 mL/min (A) (by C-G formula based on SCr of 1.37 mg/dL (H)).    Allergies  Allergen Reactions  . Sulfa Antibiotics Nausea And Vomiting and Rash    Antimicrobials this admission: Oral vancomycin 10/31/18 >>  Dose adjustments this admission: -  Microbiology results: C-Diff +  Thank you for allowing pharmacy to be a part of this patient's care.  GrNani Skillernrowford 10/31/2018 6:32 AM

## 2018-10-31 NOTE — Progress Notes (Signed)
Pharmacy Antibiotic Note  Dana Watkins is a 63 y.o. female admitted on 10/30/2018 with UTI.  Pharmacy has been consulted for Meropenem dosing.  Plan: Meropenem 1gm iv q12hr  Height: 5' 8"  (172.7 cm) Weight: 130 lb (59 kg) IBW/kg (Calculated) : 63.9  Temp (24hrs), Avg:100 F (37.8 C), Min:100 F (37.8 C), Max:100 F (37.8 C)  Recent Labs  Lab 10/24/18 0830 10/30/18 1810 10/30/18 1956  WBC 5.3 17.2*  --   CREATININE 1.07* 1.52*  --   LATICACIDVEN  --  2.6* 3.5*    Estimated Creatinine Clearance: 35.7 mL/min (A) (by C-G formula based on SCr of 1.52 mg/dL (H)).    Allergies  Allergen Reactions  . Sulfa Antibiotics Nausea And Vomiting and Rash    Antimicrobials this admission: Meropenem 10/30/2018 >>  Dose adjustments this admission: -  Microbiology results: -  Thank you for allowing pharmacy to be a part of this patient's care.  Nani Skillern Crowford 10/31/2018 3:30 AM

## 2018-11-01 DIAGNOSIS — R791 Abnormal coagulation profile: Secondary | ICD-10-CM

## 2018-11-01 DIAGNOSIS — A0472 Enterocolitis due to Clostridium difficile, not specified as recurrent: Secondary | ICD-10-CM

## 2018-11-01 DIAGNOSIS — N183 Chronic kidney disease, stage 3 unspecified: Secondary | ICD-10-CM

## 2018-11-01 DIAGNOSIS — N170 Acute kidney failure with tubular necrosis: Secondary | ICD-10-CM

## 2018-11-01 LAB — POTASSIUM
Potassium: 2.5 mmol/L — CL (ref 3.5–5.1)
Potassium: 3.2 mmol/L — ABNORMAL LOW (ref 3.5–5.1)

## 2018-11-01 LAB — PROTIME-INR
INR: 2.3 — ABNORMAL HIGH (ref 0.8–1.2)
Prothrombin Time: 25.1 seconds — ABNORMAL HIGH (ref 11.4–15.2)

## 2018-11-01 LAB — COMPREHENSIVE METABOLIC PANEL
ALT: 20 U/L (ref 0–44)
AST: 41 U/L (ref 15–41)
Albumin: 2 g/dL — ABNORMAL LOW (ref 3.5–5.0)
Alkaline Phosphatase: 126 U/L (ref 38–126)
Anion gap: 6 (ref 5–15)
BUN: 21 mg/dL (ref 8–23)
CO2: 16 mmol/L — ABNORMAL LOW (ref 22–32)
Calcium: 8.6 mg/dL — ABNORMAL LOW (ref 8.9–10.3)
Chloride: 116 mmol/L — ABNORMAL HIGH (ref 98–111)
Creatinine, Ser: 1.4 mg/dL — ABNORMAL HIGH (ref 0.44–1.00)
GFR calc Af Amer: 47 mL/min — ABNORMAL LOW (ref >60)
GFR calc non Af Amer: 40 mL/min — ABNORMAL LOW (ref >60)
Glucose, Bld: 109 mg/dL — ABNORMAL HIGH (ref 70–99)
Potassium: 2.8 mmol/L — ABNORMAL LOW (ref 3.5–5.1)
Sodium: 138 mmol/L (ref 135–145)
Total Bilirubin: 2.4 mg/dL — ABNORMAL HIGH (ref 0.3–1.2)
Total Protein: 4.8 g/dL — ABNORMAL LOW (ref 6.5–8.1)

## 2018-11-01 LAB — CBC
HCT: 24.4 % — ABNORMAL LOW (ref 36.0–46.0)
Hemoglobin: 7.6 g/dL — ABNORMAL LOW (ref 12.0–15.0)
MCH: 30.2 pg (ref 26.0–34.0)
MCHC: 31.1 g/dL (ref 30.0–36.0)
MCV: 96.8 fL (ref 80.0–100.0)
Platelets: 75 10*3/uL — ABNORMAL LOW (ref 150–400)
RBC: 2.52 MIL/uL — ABNORMAL LOW (ref 3.87–5.11)
RDW: 18.6 % — ABNORMAL HIGH (ref 11.5–15.5)
WBC: 11 10*3/uL — ABNORMAL HIGH (ref 4.0–10.5)
nRBC: 0 % (ref 0.0–0.2)

## 2018-11-01 LAB — GLUCOSE, CAPILLARY: Glucose-Capillary: 102 mg/dL — ABNORMAL HIGH (ref 70–99)

## 2018-11-01 LAB — MAGNESIUM: Magnesium: 2 mg/dL (ref 1.7–2.4)

## 2018-11-01 MED ORDER — POTASSIUM CHLORIDE 10 MEQ/100ML IV SOLN
10.0000 meq | INTRAVENOUS | Status: AC
  Administered 2018-11-01 (×4): 10 meq via INTRAVENOUS
  Filled 2018-11-01 (×3): qty 100

## 2018-11-01 MED ORDER — SODIUM CHLORIDE 0.9 % IV SOLN
INTRAVENOUS | Status: DC
  Administered 2018-11-01: 15:00:00 via INTRAVENOUS

## 2018-11-01 MED ORDER — POTASSIUM CHLORIDE CRYS ER 20 MEQ PO TBCR
40.0000 meq | EXTENDED_RELEASE_TABLET | Freq: Once | ORAL | Status: AC
  Administered 2018-11-01: 40 meq via ORAL
  Filled 2018-11-01: qty 2

## 2018-11-01 MED ORDER — POTASSIUM CHLORIDE 10 MEQ/100ML IV SOLN
INTRAVENOUS | Status: AC
  Filled 2018-11-01: qty 100

## 2018-11-01 MED ORDER — SODIUM BICARBONATE 650 MG PO TABS
650.0000 mg | ORAL_TABLET | Freq: Three times a day (TID) | ORAL | Status: DC
  Administered 2018-11-01 – 2018-11-02 (×3): 650 mg via ORAL
  Filled 2018-11-01 (×3): qty 1

## 2018-11-01 NOTE — Plan of Care (Signed)

## 2018-11-01 NOTE — Progress Notes (Signed)
CRITICAL VALUE ALERT  Critical Value:  Potassium 2.5  Date & Time Notied:  11/01/18 1210  Provider Notified: Rai  Orders Received/Actions taken: awaiting orders
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
## 2018-11-02 LAB — COMPREHENSIVE METABOLIC PANEL
ALT: 23 U/L (ref 0–44)
AST: 51 U/L — ABNORMAL HIGH (ref 15–41)
Albumin: 2 g/dL — ABNORMAL LOW (ref 3.5–5.0)
Alkaline Phosphatase: 162 U/L — ABNORMAL HIGH (ref 38–126)
Anion gap: 6 (ref 5–15)
BUN: 19 mg/dL (ref 8–23)
CO2: 16 mmol/L — ABNORMAL LOW (ref 22–32)
Calcium: 8.9 mg/dL (ref 8.9–10.3)
Chloride: 118 mmol/L — ABNORMAL HIGH (ref 98–111)
Creatinine, Ser: 1.31 mg/dL — ABNORMAL HIGH (ref 0.44–1.00)
GFR calc Af Amer: 50 mL/min — ABNORMAL LOW (ref >60)
GFR calc non Af Amer: 44 mL/min — ABNORMAL LOW (ref >60)
Glucose, Bld: 102 mg/dL — ABNORMAL HIGH (ref 70–99)
Potassium: 3.5 mmol/L (ref 3.5–5.1)
Sodium: 140 mmol/L (ref 135–145)
Total Bilirubin: 2.1 mg/dL — ABNORMAL HIGH (ref 0.3–1.2)
Total Protein: 4.7 g/dL — ABNORMAL LOW (ref 6.5–8.1)

## 2018-11-02 LAB — CBC
HCT: 24.3 % — ABNORMAL LOW (ref 36.0–46.0)
Hemoglobin: 7.3 g/dL — ABNORMAL LOW (ref 12.0–15.0)
MCH: 29.4 pg (ref 26.0–34.0)
MCHC: 30 g/dL (ref 30.0–36.0)
MCV: 98 fL (ref 80.0–100.0)
Platelets: 82 10*3/uL — ABNORMAL LOW (ref 150–400)
RBC: 2.48 MIL/uL — ABNORMAL LOW (ref 3.87–5.11)
RDW: 19.3 % — ABNORMAL HIGH (ref 11.5–15.5)
WBC: 5.6 10*3/uL (ref 4.0–10.5)
nRBC: 0 % (ref 0.0–0.2)

## 2018-11-02 LAB — URINE CULTURE: Culture: 60000 — AB

## 2018-11-02 LAB — PROTIME-INR
INR: 1.9 — ABNORMAL HIGH (ref 0.8–1.2)
Prothrombin Time: 21.4 seconds — ABNORMAL HIGH (ref 11.4–15.2)

## 2018-11-02 LAB — GLUCOSE, CAPILLARY: Glucose-Capillary: 84 mg/dL (ref 70–99)

## 2018-11-02 MED ORDER — SODIUM BICARBONATE 650 MG PO TABS: 650.0000 mg | ORAL_TABLET | Freq: Two times a day (BID) | ORAL | 0 refills | Status: DC

## 2018-11-02 MED ORDER — ONDANSETRON HCL 4 MG PO TABS: 4.0000 mg | ORAL_TABLET | Freq: Four times a day (QID) | ORAL | 0 refills | Status: DC | PRN

## 2018-11-02 MED ORDER — VANCOMYCIN 50 MG/ML ORAL SOLUTION: ORAL | 3 refills | Status: DC

## 2018-11-02 MED ORDER — VANCOMYCIN HCL 125 MG PO CAPS: ORAL_CAPSULE | ORAL | 0 refills | Status: DC

## 2018-11-02 MED FILL — SODIUM BICARB 10 GRAIN TAB: 650 | 30 days supply | Qty: 60 | Fill #0

## 2018-11-02 MED FILL — VANCOMYCIN 50MG/ML ORAL SOL: 34 days supply | Qty: 220 | Fill #0

## 2018-11-02 MED FILL — ONDANSETRON HCL 4 MG TABLET: 4 | 5 days supply | Qty: 20 | Fill #0

## 2018-11-02 NOTE — TOC Transition Note (Signed)
Transition of Care [facility]) - CM/SW Discharge Note   Patient Details  Name: Dana Watkins MRN: [id number] Date of Birth: 06-22-1955  [facility] CM/SW Contact:  Dessa Phi, RN Phone Number: 11/02/2018, 10:39 AM   Clinical Narrative:spoke to patient about insurance she says she has bcbs. Our system shows medicare. We will confirm. [place] program used for script-vancomycin 131m oral solution-Patient aware of $3 co pay/7days to use/select pharmacies-[facility] pharmacy will be able to provide the med-they will contact CM if questions. [facility] all scripts & [facility]-patient voiced understanding. No further CM needs.       Final next level of care: Home/Self Care Barriers to Discharge: No Barriers Identified   Patient Goals and CMS Choice        Discharge Placement                       Discharge Plan and Services   Discharge Planning Services: CM Consult, [facility]                                 Social Determinants of Health (SDOH) Interventions     Readmission Risk Interventions No flowsheet data found.

## 2018-11-02 NOTE — TOC Benefit Eligibility Note (Signed)
Transition of Care [facility]) Benefit Eligibility Note    Patient Details  Name: Dana Watkins MRN: [id number] Date of Birth: 1955-07-04      Covered?: (policy expired 03/26/38)        Spoke with Person/Company/Phone Number:: [employer]  Co-Pay: policy expired [id number]             [name] Phone Number: 11/02/2018, 12:04 PM
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
## 2018-11-02 NOTE — Evaluation (Signed)
Physical Therapy Evaluation Patient Details Name: GEARLENE GODSIL MRN: [id number] DOB: 02-03-1955 Today's Date: 11/02/2018   History of Present Illness  63 year old female with history of recently diagnosed cirrhosis by abdominal ultrasound, chronic thrombocytopenia, splenomegaly, anxiety, CKD stage III presented on 10/19 with fevers, nausea and nonbloody vomiting.  Patient was recently admitted from 10/5-10/13 for febrile illness, cough, shortness of breath and concerning for suspected false negative Covid. Pt now admitted with fever and emesis. Dx of sepsis, c diff.  Clinical Impression  Pt ambulated 300' without an assistive device, no loss of balance. She is ready to DC home from PT standpoint. No further PT indicated as pt is independent with mobility. PT signing off.     Follow Up Recommendations No PT follow up    Equipment Recommendations  None recommended by PT    Recommendations for Other Services       Precautions / Restrictions Precautions Precautions: None Precaution Comments: pt denies h/o falls in past 6 months Restrictions Weight Bearing Restrictions: No      Mobility  Bed Mobility Overal bed mobility: Independent                Transfers Overall transfer level: Independent                  Ambulation/Gait Ambulation/Gait assistance: Independent Gait Distance (Feet): 320 Feet Assistive device: None Gait Pattern/deviations: WFL(Within Functional Limits) Gait velocity: WNL   General Gait Details: steady, no loss of balance  Stairs            Wheelchair Mobility    Modified Rankin (Stroke Patients Only)       Balance Overall balance assessment: Independent                                           Pertinent Vitals/Pain Pain Assessment: No/denies pain    Home Living Family/patient expects to be discharged to:: Private residence Living Arrangements: Alone Available Help at Discharge:  Family;Neighbor;Available PRN/intermittently         Home Layout: One level        Prior Function Level of Independence: Independent               Hand Dominance        Extremity/Trunk Assessment   Upper Extremity Assessment Upper Extremity Assessment: Overall WFL for tasks assessed    Lower Extremity Assessment Lower Extremity Assessment: Overall WFL for tasks assessed    Cervical / Trunk Assessment Cervical / Trunk Assessment: Normal  Communication   Communication: No difficulties  Cognition Arousal/Alertness: Awake/alert Behavior During Therapy: WFL for tasks assessed/performed Overall Cognitive Status: Within Functional Limits for tasks assessed                                        General Comments      Exercises     Assessment/Plan    PT Assessment Patent does not need any further PT services  PT Problem List         PT Treatment Interventions      PT Goals (Current goals can be found in the Care Plan section)  Acute Rehab PT Goals PT Goal Formulation: All assessment and education complete, DC therapy    Frequency     Barriers to discharge  Co-evaluation               AM-PAC PT "6 Clicks" Mobility  Outcome Measure Help needed turning from your back to your side while in a flat bed without using bedrails?: None Help needed moving from lying on your back to sitting on the side of a flat bed without using bedrails?: None Help needed moving to and from a bed to a chair (including a wheelchair)?: None Help needed standing up from a chair using your arms (e.g., wheelchair or bedside chair)?: None Help needed to walk in hospital room?: None Help needed climbing 3-5 steps with a railing? : None 6 Click Score: 24    End of Session Equipment Utilized During Treatment: Gait belt Activity Tolerance: Patient tolerated treatment well Patient left: in chair;with call bell/phone within reach Nurse Communication:  Mobility status      Time: 8372-9021 PT Time Calculation (min) (ACUTE ONLY): 18 min   Charges:   PT Evaluation $PT Eval Low Complexity: 1 Low         Philomena Doheny PT 11/02/2018  Acute Rehabilitation Services Pager [phone] Office [phone]

## 2018-11-04 LAB — CULTURE, BLOOD (ROUTINE X 2)
Culture: NO GROWTH
Culture: NO GROWTH
Special Requests: ADEQUATE

## 2018-11-07 ENCOUNTER — Ambulatory Visit: Payer: BLUE CROSS/BLUE SHIELD | Admitting: Cardiology

## 2018-12-01 MED FILL — VANCOMYCIN 50MG/ML ORAL SOL: 14 days supply | Qty: 20 | Fill #1

## 2018-12-08 ENCOUNTER — Encounter (HOSPITAL_COMMUNITY): Payer: Self-pay | Admitting: Emergency Medicine

## 2018-12-08 ENCOUNTER — Emergency Department (HOSPITAL_COMMUNITY): Payer: Medicare Other

## 2018-12-08 ENCOUNTER — Inpatient Hospital Stay (HOSPITAL_COMMUNITY)
Admission: EM | Admit: 2018-12-08 | Discharge: 2018-12-11 | DRG: 371 | Disposition: A | Discharge status: Home / Self Care | Payer: Medicare Other | Attending: Family Medicine | Admitting: Family Medicine

## 2018-12-08 ENCOUNTER — Other Ambulatory Visit: Payer: Self-pay

## 2018-12-08 DIAGNOSIS — Z20828 Contact with and (suspected) exposure to other viral communicable diseases: Secondary | ICD-10-CM | POA: Diagnosis present

## 2018-12-08 DIAGNOSIS — R112 Nausea with vomiting, unspecified: Secondary | ICD-10-CM

## 2018-12-08 DIAGNOSIS — R748 Abnormal levels of other serum enzymes: Secondary | ICD-10-CM | POA: Diagnosis present

## 2018-12-08 DIAGNOSIS — E559 Vitamin D deficiency, unspecified: Secondary | ICD-10-CM | POA: Diagnosis present

## 2018-12-08 DIAGNOSIS — K7460 Unspecified cirrhosis of liver: Secondary | ICD-10-CM | POA: Diagnosis present

## 2018-12-08 DIAGNOSIS — Z882 Allergy status to sulfonamides status: Secondary | ICD-10-CM

## 2018-12-08 DIAGNOSIS — Z87891 Personal history of nicotine dependence: Secondary | ICD-10-CM

## 2018-12-08 DIAGNOSIS — N183 Chronic kidney disease, stage 3 unspecified: Secondary | ICD-10-CM | POA: Diagnosis present

## 2018-12-08 DIAGNOSIS — Z681 Body mass index (BMI) 19 or less, adult: Secondary | ICD-10-CM

## 2018-12-08 DIAGNOSIS — E876 Hypokalemia: Secondary | ICD-10-CM | POA: Diagnosis present

## 2018-12-08 DIAGNOSIS — F411 Generalized anxiety disorder: Secondary | ICD-10-CM | POA: Diagnosis present

## 2018-12-08 DIAGNOSIS — Z79899 Other long term (current) drug therapy: Secondary | ICD-10-CM

## 2018-12-08 DIAGNOSIS — E43 Unspecified severe protein-calorie malnutrition: Secondary | ICD-10-CM | POA: Diagnosis present

## 2018-12-08 DIAGNOSIS — R531 Weakness: Secondary | ICD-10-CM

## 2018-12-08 DIAGNOSIS — D649 Anemia, unspecified: Secondary | ICD-10-CM | POA: Diagnosis present

## 2018-12-08 DIAGNOSIS — A0471 Enterocolitis due to Clostridium difficile, recurrent: Secondary | ICD-10-CM | POA: Diagnosis not present

## 2018-12-08 DIAGNOSIS — E860 Dehydration: Secondary | ICD-10-CM | POA: Diagnosis present

## 2018-12-08 DIAGNOSIS — R7989 Other specified abnormal findings of blood chemistry: Secondary | ICD-10-CM | POA: Diagnosis present

## 2018-12-08 DIAGNOSIS — D631 Anemia in chronic kidney disease: Secondary | ICD-10-CM | POA: Diagnosis present

## 2018-12-08 DIAGNOSIS — Z8249 Family history of ischemic heart disease and other diseases of the circulatory system: Secondary | ICD-10-CM

## 2018-12-08 DIAGNOSIS — N179 Acute kidney failure, unspecified: Secondary | ICD-10-CM

## 2018-12-08 DIAGNOSIS — D638 Anemia in other chronic diseases classified elsewhere: Secondary | ICD-10-CM | POA: Diagnosis present

## 2018-12-08 DIAGNOSIS — I2510 Atherosclerotic heart disease of native coronary artery without angina pectoris: Secondary | ICD-10-CM | POA: Diagnosis present

## 2018-12-08 DIAGNOSIS — I1310 Hypertensive heart and chronic kidney disease without heart failure, with stage 1 through stage 4 chronic kidney disease, or unspecified chronic kidney disease: Secondary | ICD-10-CM | POA: Diagnosis present

## 2018-12-08 DIAGNOSIS — D696 Thrombocytopenia, unspecified: Secondary | ICD-10-CM | POA: Diagnosis present

## 2018-12-08 DIAGNOSIS — E872 Acidosis: Secondary | ICD-10-CM | POA: Diagnosis present

## 2018-12-08 DIAGNOSIS — R627 Adult failure to thrive: Secondary | ICD-10-CM | POA: Diagnosis present

## 2018-12-08 MED ORDER — LACTATED RINGERS IV BOLUS (SEPSIS)
1000.0000 mL | Freq: Once | INTRAVENOUS | Status: AC
  Administered 2018-12-09: 1000 mL via INTRAVENOUS

## 2018-12-08 NOTE — ED Triage Notes (Signed)
[facility] ha dto make forced entry on a wellness check for the Pt. The Pt. Was found to alert and oriented but very weak. EMS stated she had an unsteady gait. EMS gave approximately 250 NS. EMS vitals, 130/80, Hr-100, Ow2 98 RA, Resp 24.Temp 100.8. CBG 116.EMS advised that Pt. Had surgery a couple of weeks ago, but Pt. stated she did not have a surgery but statedd she was in the hospital 2 months ago for a b;ladder infection that turned into sepsis.

## 2018-12-08 NOTE — ED Provider Notes (Signed)
[facility]PART[facility] Provider Note   CSN: [id number] Arrival date & time: 12/08/18  2320     History   Chief Complaint Chief Complaint  Patient presents with  . Weakness    HPI Dana Watkins is a 63 y.o. female.     The history is provided by the patient.  Weakness Severity:  Moderate Onset quality:  Gradual Timing:  Constant Progression:  Worsening Chronicity:  New Context: not drug use   Relieved by:  Nothing Worsened by:  Nothing Associated symptoms: no abdominal pain and no cough   Patient with history of CAD, chronic kidney disease, cirrhosis presents with generalized weakness. EMS reported they were called to make a wellness check for the patient and they had to make a forced entry because she did not answer.  She was found awake and alert and had generalized weakness. Patient reports a recent admission for a bladder infection. She reports the past several days she has had generalized weakness of unclear etiology, she also reports lower extremity edema.  Patient reports she lives alone but her friends are  who called EMS Patient has any drug abuse She reports she did not hear the EMS at her door that is why she did not answer No falls or trauma reported  Past Medical History:  Diagnosis Date  . Acute hypokalemia 11/26/2014  . Arthritis 05/15/2015  . Benign essential hypertension 05/15/2015  . C. difficile colitis   . CAD (coronary artery disease)    a. cath 04/2017: ""Diffuse, calcific CAD particularly in the distal RCA and proximal to mid LAD.  LAD disease is nonobstructive.  RCA disease is more severe but does not appear significant.  Given her lack of symptoms, would pursue medical therapy."  . CKD (chronic kidney disease), stage III   . Hypokalemia   . Migraine headache 05/15/2015  . Mild mitral regurgitation   . Pancytopenia ([place])   . Sepsis ([place]) 01/2017  . Thrombocytopenia ([place])    a. chronic thrombocytopenia ([facility] -  remotely saw hematology).  . Vitamin D deficiency     Patient Active Problem List   Diagnosis Date Noted  . Hyperbilirubinemia 10/31/2018  . C. difficile diarrhea 10/31/2018  . Other cirrhosis of liver ([place]) 10/31/2018  . Systolic murmur at cardiac apex 10/31/2018  . Sepsis ([place]) 10/30/2018  . Normocytic anemia 10/23/2018  . SIRS (systemic inflammatory response syndrome) ([place]) 10/16/2018  . Acute renal failure superimposed on stage 3 chronic kidney disease ([place]) 10/16/2018  . Acute lower UTI 10/16/2018  . Elevated troponin 10/16/2018  . Elevated d-dimer 10/16/2018  . UTI (urinary tract infection) 06/25/2018  . Community acquired pneumonia of right middle lobe of lung 06/09/2018  . AKI (acute kidney injury) ([place]) 06/09/2018  . GAD (generalized anxiety disorder) 11/06/2017  . Abnormal findings on diagnostic imaging of heart and coronary circulation   . Family history of coronary artery disease 05/09/2017  . DOE (dyspnea on exertion) 05/09/2017  . Coronary artery calcification seen on CT scan 03/08/2017  . Acute bronchitis 02/06/2017  . Dehydration   . Diarrhea 02/03/2017  . Arthritis 05/15/2015  . Benign essential hypertension 05/15/2015  . Migraine headache 05/15/2015  . Hypokalemia 11/26/2014  . Alkaline phosphatase elevation 11/26/2014  . Abnormal weight gain 11/26/2014  . Chronic leukopenia 05/28/2014  . Anemia of chronic illness 05/28/2014  . Thrombocytopenia ([place]) 04/30/2013  . Elevated liver function tests 04/30/2013    Past Surgical History:  Procedure Laterality Date  . CESAREAN SECTION  24 years ago  . LEFT HEART CATH AND CORONARY ANGIOGRAPHY N/A 05/12/2017   Procedure: LEFT HEART CATH AND CORONARY ANGIOGRAPHY;  Surgeon: [name] Booze, MD;  Location: [facility];  Service: Cardiovascular;  Laterality: N/A;  . tonsils and adneoids     as a child  . ULTRASOUND GUIDANCE FOR VASCULAR ACCESS  05/12/2017   Procedure: Ultrasound Guidance For Vascular  Access;  Surgeon: [name] Booze, MD;  Location: [facility];  Service: Cardiovascular;;     OB History   No obstetric history on file.      Home Medications    Prior to Admission medications   Medication Sig Start Date End Date Taking? Authorizing Provider  Cholecalciferol (VITAMIN D3) 2000 units TABS Take 1 tablet by mouth daily.    [provider]  diclofenac sodium (VOLTAREN) 1 % GEL Apply 2 g topically 4 (four) times daily. 10/24/18   Raiford Noble Latif, DO  folic acid (FOLVITE) 1 MG tablet Take 1 tablet (1 mg total) by mouth daily. 06/29/18   Sheikh, Omair Latif, DO  KLOR-CON M20 20 MEQ tablet Take 40 mEq by mouth 2 (two) times a day. 06/22/18   [provider]  MAGNESIUM PO Take 1 tablet by mouth daily.     [provider]  Multiple Vitamin (MULTIVITAMIN WITH MINERALS) TABS tablet Take 1 tablet by mouth daily. 06/29/18   Sheikh, Omair Latif, DO  ondansetron (ZOFRAN) 4 MG tablet Take 1 tablet (4 mg total) by mouth every 6 (six) hours as needed for nausea. 11/02/18   Rai, Vernelle Emerald, MD  saccharomyces boulardii (FLORASTOR) 250 MG capsule Take 1 capsule (250 mg total) by mouth 2 (two) times daily. 06/28/18   Raiford Noble Latif, DO  sodium bicarbonate 650 MG tablet Take 1 tablet (650 mg total) by mouth 2 (two) times daily. 11/02/18   Rai, Vernelle Emerald, MD  thiamine 100 MG tablet Take 1 tablet (100 mg total) by mouth daily. 06/29/18   Raiford Noble Latif, DO  vancomycin (VANCOCIN) 50 mg/mL oral solution Please take Vancomycin by mouth 175m 4 times a day for 14 days, then 1210m twice a day for 7 days, then 12544mdaily for 7 days, then 125m34mvery other day for 7 days, then 125mg80mry 3 days for 7 days, then OFF. 11/02/18   Rai, RipudVernelle Emerald   Family History Family History  Problem Relation Age of Onset  . Pernicious anemia Maternal Grandfather   . Heart attack Maternal Grandfather   . Atrial fibrillation Mother   . Supraventricular  tachycardia Father   . Heart disease Paternal Grandfather     Social History Social History   Tobacco Use  . Smoking status: Former Smoker    Years: 2.00    Start date: 06/25/1968    Quit date: 05/16/2014    Years since quitting: 4.5  . Smokeless tobacco: Never Used  Substance Use Topics  . Alcohol use: Yes    Alcohol/week: 1.0 standard drinks    Types: 1 Glasses of wine per week    Comment: occ  . Drug use: No     Allergies   Sulfa antibiotics   Review of Systems Review of Systems  Constitutional: Positive for fatigue.  Respiratory: Negative for cough.   Cardiovascular: Positive for leg swelling.  Gastrointestinal: Negative for abdominal pain.  Neurological: Positive for weakness.  All other systems reviewed and are negative.    Physical Exam Updated Vital Signs  BP (!) 141/69 (BP Location: Left Arm)   Pulse 91   Temp 99.2 F (37.3 C) (Oral)   Resp (!) 21   Ht 1.727 m (5' 8" )   Wt 59 kg   SpO2 92%   BMI 19.77 kg/m   Physical Exam CONSTITUTIONAL: Elderly and frail HEAD: Normocephalic/atraumatic EYES: EOMI/PERRL ENMT: Mucous membranes dry NECK: supple no meningeal signs SPINE/BACK:entire spine nontender CV: S1/S2 noted LUNGS: Lungs are clear to auscultation bilaterally, no apparent distress ABDOMEN: soft, nontender, no rebound or guarding, bowel sounds noted throughout abdomen GU:no cva tenderness NEURO: Pt is awake/alert/appropriate, moves all extremitiesx4.  No facial droop.   No focal weakness noted EXTREMITIES: pulses normal/equal, full ROM, mild pitting edema to her lower extremities SKIN: warm, color normal PSYCH: no abnormalities of mood noted, alert and oriented to situation   ED Treatments / Results  Labs (all labs ordered are listed, but only abnormal results are displayed) Labs Reviewed  RAPID URINE DRUG SCREEN, HOSP PERFORMED - Abnormal; Notable for the following components:      Result Value   Benzodiazepines POSITIVE (*)    All other  components within normal limits  COMPREHENSIVE METABOLIC PANEL - Abnormal; Notable for the following components:   Potassium 2.9 (*)    Chloride 116 (*)    CO2 16 (*)    Creatinine, Ser 1.48 (*)    Total Protein 5.5 (*)    Albumin 2.3 (*)    AST 95 (*)    Alkaline Phosphatase 180 (*)    Total Bilirubin 3.4 (*)    GFR calc non Af Amer 38 (*)    GFR calc Af Amer 44 (*)    All other components within normal limits  CBC WITH DIFFERENTIAL/PLATELET - Abnormal; Notable for the following components:   RBC 2.51 (*)    Hemoglobin 7.7 (*)    HCT 24.3 (*)    RDW 17.8 (*)    Platelets 58 (*)    Lymphs Abs 0.6 (*)    All other components within normal limits  APTT - Abnormal; Notable for the following components:   aPTT 37 (*)    All other components within normal limits  PROTIME-INR - Abnormal; Notable for the following components:   Prothrombin Time 19.8 (*)    INR 1.7 (*)    All other components within normal limits  URINALYSIS, ROUTINE W REFLEX MICROSCOPIC - Abnormal; Notable for the following components:   Glucose, UA 150 (*)    Hgb urine dipstick SMALL (*)    Protein, ur 30 (*)    All other components within normal limits  AMMONIA - Abnormal; Notable for the following components:   Ammonia 60 (*)    All other components within normal limits  CULTURE, BLOOD (ROUTINE X 2)  CULTURE, BLOOD (ROUTINE X 2)  URINE CULTURE  SARS CORONAVIRUS 2 (TAT 6-24 HRS)  CK  ETHANOL  LACTIC ACID, PLASMA  POC SARS CORONAVIRUS 2 AG -  ED    EKG EKG Interpretation  Date/Time:  Saturday December 09 2018 00:18:21 EST Ventricular Rate:  90 PR Interval:    QRS Duration: 105 QT Interval:  469 QTC Calculation: 574 R Axis:   8 Text Interpretation: rhythm indeterminate suspect sinus rhythm with artifact Borderline repolarization abnormality Prolonged QT interval Baseline wander in lead(s) I II aVR Interpretation limited secondary to artifact Confirmed by Ripley Fraise [phone]) on 12/09/2018  12:22:16 AM   Radiology Ct Head Wo Contrast  Result Date: 12/09/2018 CLINICAL DATA:  Altered level  of consciousness, weakness, unsteady gait EXAM: CT HEAD WITHOUT CONTRAST TECHNIQUE: Contiguous axial images were obtained from the base of the skull through the vertex without intravenous contrast. COMPARISON:  CT head 06/25/2018 FINDINGS: Brain: No evidence of acute infarction, hemorrhage, hydrocephalus, extra-axial collection or mass lesion/mass effect. Symmetric prominence of the ventricles, cisterns and sulci compatible with parenchymal volume loss. Patchy areas of white matter hypoattenuation are most compatible with chronic microvascular angiopathy. Vascular: Atherosclerotic calcification of the carotid siphons. No hyperdense vessel. Skull: No calvarial fracture or suspicious osseous lesion. No scalp swelling or hematoma. Sinuses/Orbits: Chronic subtotal opacification of the right maxillary sinus with high attenuation material. Mild sclerotic mural thickening of the right maxillary sinus walls. No abnormal stranding seen along the sinus perimeter. Remainder of the paranasal sinuses are predominantly clear aside from mild mural disease. Mastoid air cells are well aerated. Middle ear cavities are clear. Debris noted in the right external auditory canal. Included orbital structures are unremarkable. Other: None IMPRESSION: 1. No acute intracranial abnormality. 2. Mild parenchymal atrophy and chronic microvascular angiopathy. 3. Chronic right maxillary sinusitis. High attenuation material and reactive thickening of the sinus walls suggesting chronic allergic fungal sinusitis. Electronically Signed   By: Lovena Le M.D.   On: 12/09/2018 02:27   Dg Chest Port 1 View  Result Date: 12/09/2018 CLINICAL DATA:  Fever and altered mental status EXAM: PORTABLE CHEST 1 VIEW COMPARISON:  10/30/2018 FINDINGS: Mild cardiomegaly. No focal airspace consolidation or overt pulmonary edema. No pleural effusion or  pneumothorax. IMPRESSION: Mild cardiomegaly without focal airspace disease or overt pulmonary edema. Electronically Signed   By: Ulyses Jarred M.D.   On: 12/09/2018 00:14    Procedures .Critical Care Performed by: Ripley Fraise, MD Authorized by: Ripley Fraise, MD   Critical care provider statement:    Critical care time (minutes):  35   Critical care start time:  12/09/2018 2:25 AM   Critical care end time:  12/09/2018 3:00 AM   Critical care time was exclusive of:  Separately billable procedures and treating other patients   Critical care was necessary to treat or prevent imminent or life-threatening deterioration of the following conditions:  Dehydration, hepatic failure and metabolic crisis   Critical care was time spent personally by me on the following activities:  Development of treatment plan with patient or surrogate, discussions with consultants, evaluation of patient's response to treatment, examination of patient, re-evaluation of patient's condition, pulse oximetry, ordering and review of radiographic studies, ordering and review of laboratory studies, ordering and performing treatments and interventions and review of old charts   I assumed direction of critical care for this patient from another provider in my specialty: no      Medications Ordered in ED Medications  potassium chloride 10 mEq in 100 mL IVPB (has no administration in time range)  lactated ringers bolus 1,000 mL (0 mLs Intravenous Stopped 12/09/18 0228)    And  lactated ringers bolus 1,000 mL (0 mLs Intravenous Stopped 12/09/18 0228)  ondansetron (ZOFRAN) injection 4 mg (4 mg Intravenous Given 12/09/18 0348)     Initial Impression / Assessment and Plan / ED Course  I have reviewed the triage vital signs and the nursing notes.  Pertinent labs & imaging results that were available during my care of the patient were reviewed by me and considered in my medical decision making (see chart for details).         11:51 PM Patient presents with generalized weakness. She was admitted in October for sepsis  due to C. difficile colitis Imaging and labs are pending at this time 3:22 AM Patient afebrile here, no signs of UTI or pneumonia Initial Covid screen negative However patient does have significant lab derangements including hypokalemia, dehydration. Patient also has anemia and signs of cirrhosis including thrombocytopenia Patient does have an elevation in ammonia, there could be component of hepatic encephalopathy Patient has benzodiazepines and urine drug screen, but no recent prescriptions for benzos Patient's generalized weakness and somnolence likely due to multiple factors CT head was performed that was negative for acute intracranial hemorrhage, questionable chronic fungal sinusitis Patient will be admitted to the hospital Patient will need to have ongoing evaluation for cirrhosis 3:54 AM Discussed with Dr. Maudie Mercury for admission Patient worsening signs of cirrhosis that would need evaluation. Patient with signs of metabolic acidosis without anion gap Patient reported feeling lightheaded upon standing even after IV fluid    Dana Watkins was evaluated in Emergency Department on 12/08/2018 for the symptoms described in the history of present illness. She was evaluated in the context of the global COVID-19 pandemic, which necessitated consideration that the patient might be at risk for infection with the SARS-CoV-2 virus that causes COVID-19. Institutional protocols and algorithms that pertain to the evaluation of patients at risk for COVID-19 are in a state of rapid change based on information released by regulatory bodies including the CDC and federal and state organizations. These policies and algorithms were followed during the patient's care in the ED.  Final Clinical Impressions(s) / ED Diagnoses   Final diagnoses:  Hepatic cirrhosis, unspecified hepatic cirrhosis type, unspecified  whether ascites present ([place])  Dehydration  Weakness  Hypokalemia  AKI (acute kidney injury) ([place])  Thrombocytopenia [facility])    ED Discharge Orders    None       Ripley Fraise, MD 12/09/18 [phone]

## 2018-12-09 ENCOUNTER — Emergency Department (HOSPITAL_COMMUNITY): Payer: Medicare Other

## 2018-12-09 ENCOUNTER — Observation Stay (HOSPITAL_COMMUNITY): Payer: Medicare Other

## 2018-12-09 DIAGNOSIS — D696 Thrombocytopenia, unspecified: Secondary | ICD-10-CM | POA: Diagnosis present

## 2018-12-09 DIAGNOSIS — R7989 Other specified abnormal findings of blood chemistry: Secondary | ICD-10-CM

## 2018-12-09 DIAGNOSIS — Z681 Body mass index (BMI) 19 or less, adult: Secondary | ICD-10-CM | POA: Diagnosis not present

## 2018-12-09 DIAGNOSIS — A0471 Enterocolitis due to Clostridium difficile, recurrent: Secondary | ICD-10-CM | POA: Diagnosis present

## 2018-12-09 DIAGNOSIS — I1310 Hypertensive heart and chronic kidney disease without heart failure, with stage 1 through stage 4 chronic kidney disease, or unspecified chronic kidney disease: Secondary | ICD-10-CM | POA: Diagnosis present

## 2018-12-09 DIAGNOSIS — R531 Weakness: Secondary | ICD-10-CM | POA: Diagnosis not present

## 2018-12-09 DIAGNOSIS — Z79899 Other long term (current) drug therapy: Secondary | ICD-10-CM | POA: Diagnosis not present

## 2018-12-09 DIAGNOSIS — A498 Other bacterial infections of unspecified site: Secondary | ICD-10-CM | POA: Diagnosis not present

## 2018-12-09 DIAGNOSIS — Z87891 Personal history of nicotine dependence: Secondary | ICD-10-CM | POA: Diagnosis not present

## 2018-12-09 DIAGNOSIS — E43 Unspecified severe protein-calorie malnutrition: Secondary | ICD-10-CM | POA: Diagnosis present

## 2018-12-09 DIAGNOSIS — R627 Adult failure to thrive: Secondary | ICD-10-CM | POA: Diagnosis present

## 2018-12-09 DIAGNOSIS — N183 Chronic kidney disease, stage 3 unspecified: Secondary | ICD-10-CM | POA: Diagnosis present

## 2018-12-09 DIAGNOSIS — D638 Anemia in other chronic diseases classified elsewhere: Secondary | ICD-10-CM | POA: Diagnosis not present

## 2018-12-09 DIAGNOSIS — Z8249 Family history of ischemic heart disease and other diseases of the circulatory system: Secondary | ICD-10-CM | POA: Diagnosis not present

## 2018-12-09 DIAGNOSIS — R748 Abnormal levels of other serum enzymes: Secondary | ICD-10-CM | POA: Diagnosis present

## 2018-12-09 DIAGNOSIS — D631 Anemia in chronic kidney disease: Secondary | ICD-10-CM | POA: Diagnosis present

## 2018-12-09 DIAGNOSIS — N179 Acute kidney failure, unspecified: Secondary | ICD-10-CM | POA: Diagnosis present

## 2018-12-09 DIAGNOSIS — F411 Generalized anxiety disorder: Secondary | ICD-10-CM | POA: Diagnosis present

## 2018-12-09 DIAGNOSIS — E876 Hypokalemia: Secondary | ICD-10-CM | POA: Diagnosis present

## 2018-12-09 DIAGNOSIS — K7460 Unspecified cirrhosis of liver: Secondary | ICD-10-CM | POA: Diagnosis present

## 2018-12-09 DIAGNOSIS — E559 Vitamin D deficiency, unspecified: Secondary | ICD-10-CM | POA: Diagnosis present

## 2018-12-09 DIAGNOSIS — Z882 Allergy status to sulfonamides status: Secondary | ICD-10-CM | POA: Diagnosis not present

## 2018-12-09 DIAGNOSIS — I2510 Atherosclerotic heart disease of native coronary artery without angina pectoris: Secondary | ICD-10-CM | POA: Diagnosis present

## 2018-12-09 DIAGNOSIS — Z20828 Contact with and (suspected) exposure to other viral communicable diseases: Secondary | ICD-10-CM | POA: Diagnosis present

## 2018-12-09 DIAGNOSIS — E860 Dehydration: Secondary | ICD-10-CM | POA: Diagnosis present

## 2018-12-09 DIAGNOSIS — E872 Acidosis: Secondary | ICD-10-CM | POA: Diagnosis present

## 2018-12-09 LAB — COMPREHENSIVE METABOLIC PANEL
ALT: 34 U/L (ref 0–44)
ALT: 30 U/L (ref 0–44)
AST: 95 U/L — ABNORMAL HIGH (ref 15–41)
AST: 77 U/L — ABNORMAL HIGH (ref 15–41)
Albumin: 2.3 g/dL — ABNORMAL LOW (ref 3.5–5.0)
Albumin: 2.1 g/dL — ABNORMAL LOW (ref 3.5–5.0)
Alkaline Phosphatase: 180 U/L — ABNORMAL HIGH (ref 38–126)
Alkaline Phosphatase: 137 U/L — ABNORMAL HIGH (ref 38–126)
Anion gap: 7 (ref 5–15)
Anion gap: 9 (ref 5–15)
BUN: 14 mg/dL (ref 8–23)
BUN: 14 mg/dL (ref 8–23)
CO2: 16 mmol/L — ABNORMAL LOW (ref 22–32)
CO2: 14 mmol/L — ABNORMAL LOW (ref 22–32)
Calcium: 9.5 mg/dL (ref 8.9–10.3)
Calcium: 9 mg/dL (ref 8.9–10.3)
Chloride: 116 mmol/L — ABNORMAL HIGH (ref 98–111)
Chloride: 117 mmol/L — ABNORMAL HIGH (ref 98–111)
Creatinine, Ser: 1.48 mg/dL — ABNORMAL HIGH (ref 0.44–1.00)
Creatinine, Ser: 1.5 mg/dL — ABNORMAL HIGH (ref 0.44–1.00)
GFR calc Af Amer: 44 mL/min — ABNORMAL LOW (ref >60)
GFR calc Af Amer: 43 mL/min — ABNORMAL LOW (ref >60)
GFR calc non Af Amer: 38 mL/min — ABNORMAL LOW (ref >60)
GFR calc non Af Amer: 37 mL/min — ABNORMAL LOW (ref >60)
Glucose, Bld: 99 mg/dL (ref 70–99)
Glucose, Bld: 106 mg/dL — ABNORMAL HIGH (ref 70–99)
Potassium: 2.9 mmol/L — ABNORMAL LOW (ref 3.5–5.1)
Potassium: 2.7 mmol/L — CL (ref 3.5–5.1)
Sodium: 139 mmol/L (ref 135–145)
Sodium: 140 mmol/L (ref 135–145)
Total Bilirubin: 3.4 mg/dL — ABNORMAL HIGH (ref 0.3–1.2)
Total Bilirubin: 4.4 mg/dL — ABNORMAL HIGH (ref 0.3–1.2)
Total Protein: 5.5 g/dL — ABNORMAL LOW (ref 6.5–8.1)
Total Protein: 5.2 g/dL — ABNORMAL LOW (ref 6.5–8.1)

## 2018-12-09 LAB — URINALYSIS, ROUTINE W REFLEX MICROSCOPIC
Bacteria, UA: NONE SEEN
Bilirubin Urine: NEGATIVE
Glucose, UA: 150 mg/dL — AB
Ketones, ur: NEGATIVE mg/dL
Leukocytes,Ua: NEGATIVE
Nitrite: NEGATIVE
Protein, ur: 30 mg/dL — AB
Specific Gravity, Urine: 1.014 (ref 1.005–1.030)
pH: 6 (ref 5.0–8.0)

## 2018-12-09 LAB — CBC WITH DIFFERENTIAL/PLATELET
Abs Immature Granulocytes: 0.02 10*3/uL (ref 0.00–0.07)
Abs Immature Granulocytes: 0.03 10*3/uL (ref 0.00–0.07)
Basophils Absolute: 0 10*3/uL (ref 0.0–0.1)
Basophils Absolute: 0.1 10*3/uL (ref 0.0–0.1)
Basophils Relative: 1 %
Basophils Relative: 0 %
Eosinophils Absolute: 0.1 10*3/uL (ref 0.0–0.5)
Eosinophils Absolute: 0.1 10*3/uL (ref 0.0–0.5)
Eosinophils Relative: 2 %
Eosinophils Relative: 1 %
HCT: 24.3 % — ABNORMAL LOW (ref 36.0–46.0)
HCT: 23.4 % — ABNORMAL LOW (ref 36.0–46.0)
Hemoglobin: 7.7 g/dL — ABNORMAL LOW (ref 12.0–15.0)
Hemoglobin: 7.3 g/dL — ABNORMAL LOW (ref 12.0–15.0)
Immature Granulocytes: 0 %
Immature Granulocytes: 0 %
Lymphocytes Relative: 7 %
Lymphocytes Relative: 11 %
Lymphs Abs: 0.6 10*3/uL — ABNORMAL LOW (ref 0.7–4.0)
Lymphs Abs: 1.2 10*3/uL (ref 0.7–4.0)
MCH: 30.7 pg (ref 26.0–34.0)
MCH: 30.4 pg (ref 26.0–34.0)
MCHC: 31.7 g/dL (ref 30.0–36.0)
MCHC: 31.2 g/dL (ref 30.0–36.0)
MCV: 96.8 fL (ref 80.0–100.0)
MCV: 97.5 fL (ref 80.0–100.0)
Monocytes Absolute: 0.5 10*3/uL (ref 0.1–1.0)
Monocytes Absolute: 0.9 10*3/uL (ref 0.1–1.0)
Monocytes Relative: 6 %
Monocytes Relative: 8 %
Neutro Abs: 6.9 10*3/uL (ref 1.7–7.7)
Neutro Abs: 9.2 10*3/uL — ABNORMAL HIGH (ref 1.7–7.7)
Neutrophils Relative %: 84 %
Neutrophils Relative %: 80 %
Platelets: 58 10*3/uL — ABNORMAL LOW (ref 150–400)
Platelets: 58 10*3/uL — ABNORMAL LOW (ref 150–400)
RBC: 2.51 MIL/uL — ABNORMAL LOW (ref 3.87–5.11)
RBC: 2.4 MIL/uL — ABNORMAL LOW (ref 3.87–5.11)
RDW: 17.8 % — ABNORMAL HIGH (ref 11.5–15.5)
RDW: 18 % — ABNORMAL HIGH (ref 11.5–15.5)
WBC: 8.1 10*3/uL (ref 4.0–10.5)
WBC: 11.5 10*3/uL — ABNORMAL HIGH (ref 4.0–10.5)
nRBC: 0 % (ref 0.0–0.2)
nRBC: 0 % (ref 0.0–0.2)

## 2018-12-09 LAB — RAPID URINE DRUG SCREEN, HOSP PERFORMED
Amphetamines: NOT DETECTED
Barbiturates: NOT DETECTED
Benzodiazepines: POSITIVE — AB
Cocaine: NOT DETECTED
Opiates: NOT DETECTED
Tetrahydrocannabinol: NOT DETECTED

## 2018-12-09 LAB — IRON AND TIBC
Iron: 209 ug/dL — ABNORMAL HIGH (ref 28–170)
Saturation Ratios: 79 % — ABNORMAL HIGH (ref 10.4–31.8)
TIBC: 266 ug/dL (ref 250–450)
UIBC: 57 ug/dL

## 2018-12-09 LAB — FERRITIN: Ferritin: 34 ng/mL (ref 11–307)

## 2018-12-09 LAB — APTT: aPTT: 37 seconds — ABNORMAL HIGH (ref 24–36)

## 2018-12-09 LAB — POC SARS CORONAVIRUS 2 AG -  ED: SARS Coronavirus 2 Ag: NEGATIVE

## 2018-12-09 LAB — TSH: TSH: 0.752 u[IU]/mL (ref 0.350–4.500)

## 2018-12-09 LAB — LACTIC ACID, PLASMA: Lactic Acid, Venous: 1.5 mmol/L (ref 0.5–1.9)

## 2018-12-09 LAB — MAGNESIUM: Magnesium: 1.9 mg/dL (ref 1.7–2.4)

## 2018-12-09 LAB — PROTIME-INR
INR: 1.7 — ABNORMAL HIGH (ref 0.8–1.2)
Prothrombin Time: 19.8 seconds — ABNORMAL HIGH (ref 11.4–15.2)

## 2018-12-09 LAB — CK: Total CK: 51 U/L (ref 38–234)

## 2018-12-09 LAB — AMMONIA
Ammonia: 60 umol/L — ABNORMAL HIGH (ref 9–35)
Ammonia: 63 umol/L — ABNORMAL HIGH (ref 9–35)

## 2018-12-09 LAB — SARS CORONAVIRUS 2 (TAT 6-24 HRS): SARS Coronavirus 2: NEGATIVE

## 2018-12-09 LAB — FOLATE: Folate: 31 ng/mL (ref >5.9)

## 2018-12-09 LAB — VITAMIN B12: Vitamin B-12: 1207 pg/mL — ABNORMAL HIGH (ref 180–914)

## 2018-12-09 LAB — ETHANOL: Alcohol, Ethyl (B): <10 mg/dL (ref <10)

## 2018-12-09 MED ORDER — POTASSIUM CHLORIDE CRYS ER 20 MEQ PO TBCR
40.0000 meq | EXTENDED_RELEASE_TABLET | Freq: Once | ORAL | Status: DC
  Filled 2018-12-09: qty 2

## 2018-12-09 MED ORDER — FOLIC ACID 1 MG PO TABS
1.0000 mg | ORAL_TABLET | Freq: Every day | ORAL | Status: DC
  Administered 2018-12-09 – 2018-12-11 (×3): 1 mg via ORAL
  Filled 2018-12-09 (×3): qty 1

## 2018-12-09 MED ORDER — ONDANSETRON HCL 4 MG PO TABS
4.0000 mg | ORAL_TABLET | Freq: Four times a day (QID) | ORAL | Status: DC | PRN
  Administered 2018-12-09: 4 mg via ORAL
  Filled 2018-12-09: qty 1

## 2018-12-09 MED ORDER — ONDANSETRON HCL 4 MG/2ML IJ SOLN
4.0000 mg | Freq: Four times a day (QID) | INTRAMUSCULAR | Status: DC | PRN
  Administered 2018-12-09: 4 mg via INTRAVENOUS
  Filled 2018-12-09: qty 2

## 2018-12-09 MED ORDER — ADULT MULTIVITAMIN W/MINERALS CH
1.0000 | ORAL_TABLET | Freq: Every day | ORAL | Status: DC
  Administered 2018-12-09 – 2018-12-11 (×3): 1 via ORAL
  Filled 2018-12-09 (×3): qty 1

## 2018-12-09 MED ORDER — POTASSIUM CHLORIDE 10 MEQ/100ML IV SOLN
10.0000 meq | Freq: Once | INTRAVENOUS | Status: AC
  Administered 2018-12-09: 10 meq via INTRAVENOUS
  Filled 2018-12-09: qty 100

## 2018-12-09 MED ORDER — SACCHAROMYCES BOULARDII 250 MG PO CAPS
250.0000 mg | ORAL_CAPSULE | Freq: Two times a day (BID) | ORAL | Status: DC
  Administered 2018-12-09 – 2018-12-11 (×5): 250 mg via ORAL
  Filled 2018-12-09 (×5): qty 1

## 2018-12-09 MED ORDER — POTASSIUM CHLORIDE 20 MEQ PO PACK
40.0000 meq | PACK | Freq: Every day | ORAL | Status: DC
  Administered 2018-12-10: 40 meq via ORAL
  Filled 2018-12-09: qty 2

## 2018-12-09 MED ORDER — VITAMIN B-1 100 MG PO TABS
100.0000 mg | ORAL_TABLET | Freq: Every day | ORAL | Status: DC
  Administered 2018-12-09 – 2018-12-11 (×3): 100 mg via ORAL
  Filled 2018-12-09 (×3): qty 1

## 2018-12-09 MED ORDER — SODIUM CHLORIDE 0.9% FLUSH
3.0000 mL | Freq: Two times a day (BID) | INTRAVENOUS | Status: DC
  Administered 2018-12-09 – 2018-12-10 (×4): 3 mL via INTRAVENOUS

## 2018-12-09 MED ORDER — POTASSIUM CHLORIDE 20 MEQ PO PACK
40.0000 meq | PACK | Freq: Once | ORAL | Status: AC
  Administered 2018-12-09: 40 meq via ORAL
  Filled 2018-12-09: qty 2

## 2018-12-09 MED ORDER — SODIUM BICARBONATE 650 MG PO TABS
650.0000 mg | ORAL_TABLET | Freq: Two times a day (BID) | ORAL | Status: DC
  Administered 2018-12-09 – 2018-12-11 (×5): 650 mg via ORAL
  Filled 2018-12-09 (×5): qty 1

## 2018-12-09 MED ORDER — FUROSEMIDE 10 MG/ML IJ SOLN
20.0000 mg | Freq: Every day | INTRAMUSCULAR | Status: AC
  Administered 2018-12-09: 20 mg via INTRAVENOUS
  Filled 2018-12-09: qty 2

## 2018-12-09 MED ORDER — LACTULOSE 10 GM/15ML PO SOLN
10.0000 g | Freq: Two times a day (BID) | ORAL | Status: DC
  Filled 2018-12-09 (×2): qty 15

## 2018-12-09 MED ORDER — POTASSIUM CHLORIDE CRYS ER 20 MEQ PO TBCR
40.0000 meq | EXTENDED_RELEASE_TABLET | Freq: Every day | ORAL | Status: DC
  Administered 2018-12-09: 40 meq via ORAL
  Filled 2018-12-09: qty 2

## 2018-12-09 MED ORDER — PRO-STAT SUGAR FREE PO LIQD
30.0000 mL | Freq: Two times a day (BID) | ORAL | Status: DC
  Administered 2018-12-09 – 2018-12-11 (×4): 30 mL via ORAL
  Filled 2018-12-09 (×5): qty 30

## 2018-12-09 MED ORDER — SODIUM CHLORIDE 0.9% FLUSH: 3.0000 mL | INTRAVENOUS | Status: DC | PRN

## 2018-12-09 MED ORDER — VANCOMYCIN 50 MG/ML ORAL SOLUTION: 125.0000 mg | Freq: Four times a day (QID) | ORAL | Status: DC

## 2018-12-09 MED ORDER — VITAMIN D 25 MCG (1000 UNIT) PO TABS
2000.0000 [IU] | ORAL_TABLET | Freq: Every day | ORAL | Status: DC
  Administered 2018-12-09 – 2018-12-11 (×3): 2000 [IU] via ORAL
  Filled 2018-12-09 (×3): qty 2

## 2018-12-09 MED ORDER — SODIUM CHLORIDE 0.9 % IV SOLN
INTRAVENOUS | Status: DC
  Administered 2018-12-09 – 2018-12-10 (×2): via INTRAVENOUS

## 2018-12-09 MED ORDER — ONDANSETRON HCL 4 MG/2ML IJ SOLN
4.0000 mg | Freq: Once | INTRAMUSCULAR | Status: AC
  Administered 2018-12-09: 4 mg via INTRAVENOUS
  Filled 2018-12-09: qty 2

## 2018-12-09 MED ORDER — DICLOFENAC SODIUM 1 % TD GEL
2.0000 g | Freq: Four times a day (QID) | TRANSDERMAL | Status: DC
  Administered 2018-12-09: 2 g via TOPICAL
  Filled 2018-12-09 (×2): qty 100

## 2018-12-09 MED ORDER — SODIUM CHLORIDE 0.9 % IV SOLN: 250.0000 mL | INTRAVENOUS | Status: DC | PRN

## 2018-12-09 NOTE — Evaluation (Signed)
Physical Therapy Evaluation Patient Details Name: Dana Watkins MRN: [id number] DOB: September 14, 1955 Today's Date: 12/09/2018   History of Present Illness  63yo female with difficulty walking due to weakness and BLE edema. CT negative for acute intracranial changes. Covid negative. Admitted for weakness and BLE edema, failure to thrive. PMH HTN, hx C Diff colitis, CKD, mitral regurg, sepsis  Clinical Impression   Patient received in bed, pleasant and willing to participate in session. Able to complete bed mobility with Min guard-MinA, functional transfers with ModA and Mod cues for safety/sequencing, and gait 23fx2 with RW and MinA for device management. Orthostatics negative and VSS otherwise WNL on room air. MMT found to be 3/5 grossly in BLEs. She was left in bed with all needs met, bed alarm active and RN aware of patient status/observed part of session. Currently recommending intensive therapies in the CIR setting due to significant loss of mobility and reduced independence, high fall risk/high risk for poor outcomes if to return home alone.     Follow Up Recommendations CIR;Supervision/Assistance - 24 hour    Equipment Recommendations  Rolling walker with 5" wheels;3in1 (PT)    Recommendations for Other Services       Precautions / Restrictions Precautions Precautions: Fall Restrictions Weight Bearing Restrictions: No      Mobility  Bed Mobility Overal bed mobility: Needs Assistance Bed Mobility: Supine to Sit;Sit to Supine     Supine to sit: Min assist Sit to supine: Min guard   General bed mobility comments: increased time and effort in and out of bed  Transfers Overall transfer level: Needs assistance Equipment used: Rolling walker (2 wheeled) Transfers: Sit to/from Stand Sit to Stand: Mod assist         General transfer comment: ModA to boost to fully upright position, Mod VC/TC for hand placement and sequencing  Ambulation/Gait Ambulation/Gait assistance:  Min assist Gait Distance (Feet): 15 Feet(x2) Assistive device: Rolling walker (2 wheeled) Gait Pattern/deviations: Step-through pattern;Decreased step length - right;Decreased step length - left;Decreased stride length;Drifts right/left;Narrow base of support Gait velocity: decreased   General Gait Details: MinA to manage RW, narrow BOS and very easily fatigued  Stairs            Wheelchair Mobility    Modified Rankin (Stroke Patients Only)       Balance Overall balance assessment: Needs assistance Sitting-balance support: No upper extremity supported;Feet supported Sitting balance-Leahy Scale: Good Sitting balance - Comments: able to respond to external pertubations well, decent core strength   Standing balance support: Bilateral upper extremity supported;During functional activity Standing balance-Leahy Scale: Fair Standing balance comment: heavy reliance on B UE support                             Pertinent Vitals/Pain Pain Assessment: No/denies pain    Home Living Family/patient expects to be discharged to:: Private residence   Available Help at Discharge: Family;Neighbor;Available PRN/intermittently Type of Home: Apartment Home Access: Level entry     Home Layout: One level Home Equipment: None      Prior Function Level of Independence: Independent               Hand Dominance   Dominant Hand: Right    Extremity/Trunk Assessment   Upper Extremity Assessment Upper Extremity Assessment: Defer to OT evaluation    Lower Extremity Assessment Lower Extremity Assessment: RLE deficits/detail;LLE deficits/detail RLE Deficits / Details: 3/5 grossly RLE Coordination: WNL LLE Deficits /  Details: 3/5 grossly LLE Coordination: WNL    Cervical / Trunk Assessment Cervical / Trunk Assessment: Normal  Communication   Communication: No difficulties  Cognition Arousal/Alertness: Awake/alert Behavior During Therapy: Flat affect Overall  Cognitive Status: Impaired/Different from baseline Area of Impairment: Orientation;Attention;Memory;Following commands;Safety/judgement;Awareness;Problem solving                 Orientation Level: Person;Place;Time;Situation Current Attention Level: Sustained   Following Commands: Follows one step commands consistently;Follows one step commands with increased time Safety/Judgement: Decreased awareness of safety Awareness: Intellectual Problem Solving: Slow processing;Difficulty sequencing;Requires verbal cues;Requires tactile cues;Decreased initiation        General Comments General comments (skin integrity, edema, etc.): VSS on room air    Exercises     Assessment/Plan    PT Assessment Patient needs continued PT services  PT Problem List Decreased strength;Decreased cognition;Decreased knowledge of use of DME;Decreased activity tolerance;Decreased safety awareness;Decreased balance;Decreased mobility;Decreased coordination       PT Treatment Interventions DME instruction;Balance training;Gait training;Neuromuscular re-education;Stair training;Functional mobility training;Patient/family education;Therapeutic activities;Therapeutic exercise    PT Goals (Current goals can be found in the Care Plan section)  Acute Rehab PT Goals Patient Stated Goal: regain independence PT Goal Formulation: With patient Time For Goal Achievement: 12/23/18 Potential to Achieve Goals: Good    Frequency Min 3X/week   Barriers to discharge Decreased caregiver support lives alone with assist only available intermittently    Co-evaluation               AM-PAC PT "6 Clicks" Mobility  Outcome Measure Help needed turning from your back to your side while in a flat bed without using bedrails?: A Little Help needed moving from lying on your back to sitting on the side of a flat bed without using bedrails?: A Little Help needed moving to and from a bed to a chair (including a wheelchair)?:  A Little Help needed standing up from a chair using your arms (e.g., wheelchair or bedside chair)?: A Lot Help needed to walk in hospital room?: A Little Help needed climbing 3-5 steps with a railing? : A Lot 6 Click Score: 16    End of Session Equipment Utilized During Treatment: Gait belt Activity Tolerance: Patient limited by fatigue;Patient tolerated treatment well Patient left: in bed;with call bell/phone within reach;with bed alarm set Nurse Communication: Mobility status PT Visit Diagnosis: Unsteadiness on feet (R26.81);Difficulty in walking, not elsewhere classified (R26.2);Muscle weakness (generalized) (M62.81)    Time: 3601-6580 PT Time Calculation (min) (ACUTE ONLY): 36 min   Charges:   PT Evaluation $PT Eval Moderate Complexity: 1 Mod PT Treatments $Gait Training: 8-22 mins        Windell Norfolk, DPT, PN1   Supplemental Physical Therapist [place]    Pager [phone] Acute Rehab Office [phone]

## 2018-12-09 NOTE — ED Notes (Signed)
Pt. States she is to nauseated to take PO meds. Notification nmade

## 2018-12-09 NOTE — Progress Notes (Signed)
PROGRESS NOTE  Dana Watkins QAS:[id number] DOB: 09/12/1955 DOA: 12/08/2018 PCP: Lawerance Cruel, MD  HPI/Recap of past 30 hours: 63 year old female, with history of hypertension, CKD stage III, liver cirrhosis, vitamin D deficiency, recent C. difficile colitis, presents with difficulty walking, generalized weakness.  Patient lives alone, and a wellness check was activated by her friends who noted patient has not been answering her phone.  EMS had to make a forced entry because she did not answer the door, was found awake and alert but had generalized weakness.  Patient reports several days of generalized weakness of unclear etiology with some associated lower extremity edema.  No trauma or fall noted.  Of note, patient was recently discharged from the hospital after being managed for C. difficile colitis.  In the ED, patient remained afebrile, with no overt signs of infection.  Labs showed hypokalemia, elevated ammonia, UDS was positive for benzodiazepines, with no recent prescriptions for benzos, CT head negative for any acute intracranial hemorrhage but questionable chronic fungal sinusitis.  Patient admitted for further management.   Today, patient noted to be somewhat lethargic/sleepy, denies any chest pain, abdominal pain, was noted to be nauseous but denied any vomiting, no fever or chills noted, no diarrhea, no dysuria.    Assessment/Plan: Principal Problem:   Weakness Active Problems:   Thrombocytopenia (HCC)   Elevated liver function tests   Anemia of chronic illness   Generalized weakness/failure to thrive Presents with BLE weakness/edema, difficulty walking Afebrile, no leukocytosis UA with glucose, small hemoglobin, mucus otherwise unremarkable BC x2 pending, UC pending CK 51, lactic acidosis 1.5 Ammonia 60 UDS positive for benzodiazepines Chest x-ray showed mild cardiomegaly without focal airspace disease or overt pulmonary edema CT head with no acute  intracranial abnormality, possible chronic allergic fungal sinusitis Status post IV hydration PT/OT Telemetry  Hypokalemia Replace as needed  Liver cirrhosis?? Decompensated Ammonia 60, no overt ascites on examination Reports only social drinking, alcohol level on admission unremarkable UDS positive for benzodiazepines Noted hyperbilirubinemia, elevated alk phosphatase/INR, thrombocytopenia, all somewhat stable at baseline Right upper quadrant ultrasound showed no evidence of cholecystitis or biliary obstruction, small volume ascites Consider starting lactulose, pending repeat ammonia level Monitor closely Daily CMP Advised to abstain from alcohol  CKD stage III with metabolic acidosis Creatinine at baseline Status post IV fluids, continue p.o. bicarb Daily BMP  Anemia of chronic disease/thrombocytopenia Hemoglobin/PLT somewhat at baseline Likely due to liver cirrhosis Anemia panel pending  History of C. Difficile Denies any current diarrhea Completed p.o. vancomycin New probiotics       Malnutrition Type:      Malnutrition Characteristics:      Nutrition Interventions:       Estimated body mass index is 23.4 kg/m as calculated from the following:   Height as of this encounter: 5' 8"  (1.727 m).   Weight as of this encounter: 69.8 kg.     Code Status: Full  Family Communication: None at bedside  Disposition Plan: To be determined   Consultants:  None  Procedures:  None  Antimicrobials:  None  DVT prophylaxis: SCDs (significant thrombocytopenia)   Objective: Vitals:   12/09/18 0532 12/09/18 1045 12/09/18 1047 12/09/18 1345  BP:  131/67 114/66 119/68  Pulse:    90  Resp:    18  Temp:    99.6 F (37.6 C)  TempSrc:      SpO2:    99%  Weight: 69.8 kg     Height: 5' 8"  (1.727 m)  Intake/Output Summary (Last 24 hours) at 12/09/2018 1405 Last data filed at 12/09/2018 0640 Gross per 24 hour  Intake 87.87 ml  Output --   Net 87.87 ml   Filed Weights   12/08/18 2313 12/09/18 0040 12/09/18 0532  Weight: 59 kg 61.2 kg 69.8 kg    Exam:  General: NAD, lethargic, chronically ill-appearing, deconditioned  Cardiovascular: S1, S2 present  Respiratory:  Mildly diminished breath sounds bilaterally  Abdomen: Soft, nontender, nondistended, bowel sounds present  Musculoskeletal: +1 bilateral pedal edema noted  Skin: Normal  Psychiatry: Normal mood Neurology: Decreased strength in BLE, no obvious focal neurologic deficits noted   Data Reviewed: CBC: Recent Labs  Lab 12/08/18 2346  WBC 8.1  NEUTROABS 6.9  HGB 7.7*  HCT 24.3*  MCV 96.8  PLT 58*   Basic Metabolic Panel: Recent Labs  Lab 12/08/18 2346 12/09/18 0826  NA 139  --   K 2.9*  --   CL 116*  --   CO2 16*  --   GLUCOSE 99  --   BUN 14  --   CREATININE 1.48*  --   CALCIUM 9.5  --   MG  --  1.9   GFR: Estimated Creatinine Clearance: 39.8 mL/min (A) (by C-G formula based on SCr of 1.48 mg/dL (H)). Liver Function Tests: Recent Labs  Lab 12/08/18 2346  AST 95*  ALT 34  ALKPHOS 180*  BILITOT 3.4*  PROT 5.5*  ALBUMIN 2.3*   No results for input(s): LIPASE, AMYLASE in the last 168 hours. Recent Labs  Lab 12/09/18 0016  AMMONIA 60*   Coagulation Profile: Recent Labs  Lab 12/08/18 2346  INR 1.7*   Cardiac Enzymes: Recent Labs  Lab 12/08/18 2346  CKTOTAL 51   BNP (last 3 results) No results for input(s): PROBNP in the last 8760 hours. HbA1C: No results for input(s): HGBA1C in the last 72 hours. CBG: No results for input(s): GLUCAP in the last 168 hours. Lipid Profile: No results for input(s): CHOL, HDL, LDLCALC, TRIG, CHOLHDL, LDLDIRECT in the last 72 hours. Thyroid Function Tests: No results for input(s): TSH, T4TOTAL, FREET4, T3FREE, THYROIDAB in the last 72 hours. Anemia Panel: No results for input(s): VITAMINB12, FOLATE, FERRITIN, TIBC, IRON, RETICCTPCT in the last 72 hours. Urine analysis:    Component  Value Date/Time   COLORURINE YELLOW 12/09/2018 0053   APPEARANCEUR CLEAR 12/09/2018 0053   LABSPEC 1.014 12/09/2018 0053   PHURINE 6.0 12/09/2018 0053   GLUCOSEU 150 (A) 12/09/2018 0053   HGBUR SMALL (A) 12/09/2018 0053   BILIRUBINUR NEGATIVE 12/09/2018 0053   KETONESUR NEGATIVE 12/09/2018 0053   PROTEINUR 30 (A) 12/09/2018 0053   NITRITE NEGATIVE 12/09/2018 0053   LEUKOCYTESUR NEGATIVE 12/09/2018 0053   Sepsis Labs: @LABRCNTIP (procalcitonin:4,lacticidven:4)  ) Recent Results (from the past 240 hour(s))  Blood Culture (routine x 2)     Status: None (Preliminary result)   Collection Time: 12/08/18 12:30 AM   Specimen: BLOOD LEFT FOREARM  Result Value Ref Range Status   Specimen Description BLOOD LEFT FOREARM  Final   Special Requests   Final    BOTTLES DRAWN AEROBIC AND ANAEROBIC Blood Culture adequate volume   Culture   Final    NO GROWTH < 12 HOURS Performed at [facility], [place] [address] [place], [place] [address]    Report Status PENDING  Incomplete  Blood Culture (routine x 2)     Status: None (Preliminary result)   Collection Time: 12/08/18  1:36 AM   Specimen: BLOOD  Result Value Ref Range Status   Specimen Description BLOOD RIGHT ANTECUBITAL  Final   Special Requests   Final    BOTTLES DRAWN AEROBIC AND ANAEROBIC Blood Culture adequate volume   Culture   Final    NO GROWTH < 12 HOURS Performed at [facility], [address], [place], [place] [address]    Report Status PENDING  Incomplete  SARS CORONAVIRUS 2 (TAT 6-24 HRS) Nasopharyngeal Nasopharyngeal Swab     Status: None   Collection Time: 12/09/18  2:46 AM   Specimen: Nasopharyngeal Swab  Result Value Ref Range Status   SARS Coronavirus 2 NEGATIVE NEGATIVE Final    Comment: (NOTE) SARS-CoV-2 target nucleic acids are NOT DETECTED. The SARS-CoV-2 RNA is generally detectable in upper and lower respiratory specimens during the acute phase of infection. Negative results do not preclude SARS-CoV-2  infection, do not rule out co-infections with other pathogens, and should not be used as the sole basis for treatment or other patient management decisions. Negative results must be combined with clinical observations, patient history, and epidemiological information. The expected result is Negative. Fact Sheet for Patients: SugarRoll.be Fact Sheet for Healthcare Providers: https://www.woods-mathews.com/ This test is not yet approved or cleared by the [place] FDA and  has been authorized for detection and/or diagnosis of SARS-CoV-2 by FDA under an Emergency Use Authorization (EUA). This EUA will remain  in effect (meaning this test can be used) for the duration of the COVID-19 declaration under Section 56 4(b)(1) of the Act, 21 U.S.C. section 360bbb-3(b)(1), unless the authorization is terminated or revoked sooner. Performed at [facility], [place] [address], [place], [place] [address]       Studies: Ct Head Wo Contrast  Result Date: 12/09/2018 CLINICAL DATA:  Altered level of consciousness, weakness, unsteady gait EXAM: CT HEAD WITHOUT CONTRAST TECHNIQUE: Contiguous axial images were obtained from the base of the skull through the vertex without intravenous contrast. COMPARISON:  CT head 06/25/2018 FINDINGS: Brain: No evidence of acute infarction, hemorrhage, hydrocephalus, extra-axial collection or mass lesion/mass effect. Symmetric prominence of the ventricles, cisterns and sulci compatible with parenchymal volume loss. Patchy areas of white matter hypoattenuation are most compatible with chronic microvascular angiopathy. Vascular: Atherosclerotic calcification of the carotid siphons. No hyperdense vessel. Skull: No calvarial fracture or suspicious osseous lesion. No scalp swelling or hematoma. Sinuses/Orbits: Chronic subtotal opacification of the right maxillary sinus with high attenuation material. Mild sclerotic mural thickening of  the right maxillary sinus walls. No abnormal stranding seen along the sinus perimeter. Remainder of the paranasal sinuses are predominantly clear aside from mild mural disease. Mastoid air cells are well aerated. Middle ear cavities are clear. Debris noted in the right external auditory canal. Included orbital structures are unremarkable. Other: None IMPRESSION: 1. No acute intracranial abnormality. 2. Mild parenchymal atrophy and chronic microvascular angiopathy. 3. Chronic right maxillary sinusitis. High attenuation material and reactive thickening of the sinus walls suggesting chronic allergic fungal sinusitis. Electronically Signed   By: Lovena Le M.D.   On: 12/09/2018 02:27   Dg Chest Port 1 View  Result Date: 12/09/2018 CLINICAL DATA:  Fever and altered mental status EXAM: PORTABLE CHEST 1 VIEW COMPARISON:  10/30/2018 FINDINGS: Mild cardiomegaly. No focal airspace consolidation or overt pulmonary edema. No pleural effusion or pneumothorax. IMPRESSION: Mild cardiomegaly without focal airspace disease or overt pulmonary edema. Electronically Signed   By: Ulyses Jarred M.D.   On: 12/09/2018 00:14   US Abdomen Limited Ruq  Result Date: 12/09/2018  CLINICAL DATA:  Elevated LFTs EXAM: ULTRASOUND ABDOMEN LIMITED RIGHT UPPER QUADRANT COMPARISON:  10/31/2018 and previous FINDINGS: Gallbladder: Single mobile 6 mm calculus. No wall thickening. No pericholecystic fluid. Sonographer describes no sonographic Murphy's sign. Common bile duct: Diameter: 6 mm Liver: Nodular contour. No mass. 1.5 cm left lobe simple cyst. Within normal limits in parenchymal echogenicity. Portal vein is patent on color Doppler imaging with normal direction of blood flow towards the liver. Other: Small volume perihepatic ascites. 4 cm upper pole right renal cyst, as described on previous MR 05/29/2013. IMPRESSION: 1. Cholelithiasis without other ultrasound evidence of cholecystitis or biliary obstruction. 2. Probable cirrhosis, with  small volume perihepatic ascites Electronically Signed   By: Lucrezia Europe M.D.   On: 12/09/2018 13:45    Scheduled Meds:  cholecalciferol  2,000 Units Oral Daily   diclofenac sodium  2 g Topical QID   feeding supplement (PRO-STAT SUGAR FREE 64)  30 mL Oral BID   folic acid  1 mg Oral Daily   multivitamin with minerals  1 tablet Oral Daily   potassium chloride SA  40 mEq Oral Daily   saccharomyces boulardii  250 mg Oral BID   sodium bicarbonate  650 mg Oral BID   sodium chloride flush  3 mL Intravenous Q12H   thiamine  100 mg Oral Daily    Continuous Infusions:  sodium chloride       LOS: 0 days     Alma Friendly, MD [facility]  If 7PM-7AM, please contact night-coverage [email or web address] 12/09/2018, 2:05 PM

## 2018-12-09 NOTE — Progress Notes (Addendum)
[facility] to determine if patient completed her C.diff treatment with oral vancomycin taper as ordered on her home medication list. Patient picked up first prescription on 10/22 which was the full amount. On 11/20, the patient only picked up 20 mL which was not enough to complete her treatment per the pharmacist at [facility] and the administration instructions listed. She did not have insurance to cover the rest of her treatment.   Looking at her record, this was the patient's first occurrence of C. diff infection. Based on this information, 10 days of oral vancomycin would have been enough to appropriately treat her. Per Dr. Horris Latino, patient is not symptomatic and no further treatment is warranted at this time.   Agnes Lawrence, PharmD PGY1 Pharmacy Resident

## 2018-12-09 NOTE — H&P (Addendum)
TRH H&P    Patient Demographics:    Dana Watkins, is a 63 y.o. female  MRN: [id number]  DOB - 07-22-1955  Admit Date - 12/08/2018  Referring MD/NP/PA: Ripley Fraise  Outpatient Primary MD for the patient is Lawerance Cruel, MD  Patient coming from:  home  Chief complaint-  weakness   HPI:    Dana Watkins  is a 63 y.o. female,  w hypertension, ckd stage3, cad, mild mitral regurgitation, cirrhosis, anemia, thrombocytopenia, vitamin D deficiency, recent C. Diff colitis, apparently presents with c/o difficulty walking due to lower ext edema  In ED,  T 99.2, P 91  R 21, Bp 141/69  Pox 92% on Ra Wt 59kg  CT brain IMPRESSION: 1. No acute intracranial abnormality. 2. Mild parenchymal atrophy and chronic microvascular angiopathy. 3. Chronic right maxillary sinusitis. High attenuation material and reactive thickening of the sinus walls suggesting chronic allergic fungal sinusitis.  CXR IMPRESSION: Mild cardiomegaly without focal airspace disease or overt pulmonary edema.  Na 139, K 2.9 Bun 14, creatinine 1.48 AG 7, Hco3 16 Ast 95, Alt 34, Alk hos 180, T. Bili 3.4 INR 1.7 ETOH <10 Ammonia 60 Urinalysis prot 30 UDS benzo positive  Wbc 8.1, Hgb 7.7, Plt 58  Pt will be admitted for weakness, and failure to thrive      Review of systems:    In addition to the HPI above,   No Fever-chills, No Headache, No changes with Vision or hearing, No problems swallowing food or Liquids, No Chest pain, Cough or Shortness of Breath, No Abdominal pain, No Nausea or Vomiting, bowel movements are regular, No Blood in stool or Urine, No dysuria, No new skin rashes or bruises, No new joints pains-aches,  No new weakness, tingling, numbness in any extremity, No recent weight gain or loss, No polyuria, polydypsia or polyphagia, No significant Mental Stressors.  All other systems reviewed and  are negative.    Past History of the following :    Past Medical History:  Diagnosis Date  . Acute hypokalemia 11/26/2014  . Arthritis 05/15/2015  . Benign essential hypertension 05/15/2015  . C. difficile colitis   . CAD (coronary artery disease)    a. cath 04/2017: ""Diffuse, calcific CAD particularly in the distal RCA and proximal to mid LAD.  LAD disease is nonobstructive.  RCA disease is more severe but does not appear significant.  Given her lack of symptoms, would pursue medical therapy."  . CKD (chronic kidney disease), stage III   . Hypokalemia   . Migraine headache 05/15/2015  . Mild mitral regurgitation   . Pancytopenia ([place])   . Sepsis ([place]) 01/2017  . Thrombocytopenia ([place])    a. chronic thrombocytopenia (ITP - remotely saw hematology).  . Vitamin D deficiency       Past Surgical History:  Procedure Laterality Date  . CESAREAN SECTION     24 years ago  . LEFT HEART CATH AND CORONARY ANGIOGRAPHY N/A 05/12/2017   Procedure: LEFT HEART CATH AND CORONARY ANGIOGRAPHY;  Surgeon: [name] Booze, MD;  Location: [facility];  Service: Cardiovascular;  Laterality: N/A;  . tonsils and adneoids     as a child  . ULTRASOUND GUIDANCE FOR VASCULAR ACCESS  05/12/2017   Procedure: Ultrasound Guidance For Vascular Access;  Surgeon: [name] Booze, MD;  Location: [facility];  Service: Cardiovascular;;      Social History:      Social History   Tobacco Use  . Smoking status: Former Smoker    Years: 2.00    Start date: 06/25/1968    Quit date: 05/16/2014    Years since quitting: 4.5  . Smokeless tobacco: Never Used  Substance Use Topics  . Alcohol use: Yes    Alcohol/week: 1.0 standard drinks    Types: 1 Glasses of wine per week    Comment: occ       Family History :     Family History  Problem Relation Age of Onset  . Pernicious anemia Maternal Grandfather   . Heart attack Maternal Grandfather   . Atrial fibrillation Mother   . Supraventricular  tachycardia Father   . Heart disease Paternal Grandfather        Home Medications:   Prior to Admission medications   Medication Sig Start Date End Date Taking? Authorizing Provider  Cholecalciferol (VITAMIN D3) 2000 units TABS Take 1 tablet by mouth daily.    [provider]  diclofenac sodium (VOLTAREN) 1 % GEL Apply 2 g topically 4 (four) times daily. 10/24/18   Raiford Noble Latif, DO  folic acid (FOLVITE) 1 MG tablet Take 1 tablet (1 mg total) by mouth daily. 06/29/18   Sheikh, Omair Latif, DO  KLOR-CON M20 20 MEQ tablet Take 40 mEq by mouth 2 (two) times a day. 06/22/18   [provider]  MAGNESIUM PO Take 1 tablet by mouth daily.     [provider]  Multiple Vitamin (MULTIVITAMIN WITH MINERALS) TABS tablet Take 1 tablet by mouth daily. 06/29/18   Sheikh, Omair Latif, DO  ondansetron (ZOFRAN) 4 MG tablet Take 1 tablet (4 mg total) by mouth every 6 (six) hours as needed for nausea. 11/02/18   Rai, Vernelle Emerald, MD  saccharomyces boulardii (FLORASTOR) 250 MG capsule Take 1 capsule (250 mg total) by mouth 2 (two) times daily. 06/28/18   Raiford Noble Latif, DO  sodium bicarbonate 650 MG tablet Take 1 tablet (650 mg total) by mouth 2 (two) times daily. 11/02/18   Rai, Vernelle Emerald, MD  thiamine 100 MG tablet Take 1 tablet (100 mg total) by mouth daily. 06/29/18   Raiford Noble Latif, DO  vancomycin (VANCOCIN) 50 mg/mL oral solution Please take Vancomycin by mouth 159m 4 times a day for 14 days, then 1255m twice a day for 7 days, then 1251mdaily for 7 days, then 125m46mvery other day for 7 days, then 125mg76mry 3 days for 7 days, then OFF. 11/02/18   Rai, RipudVernelle Emerald    Allergies:     Allergies  Allergen Reactions  . Sulfa Antibiotics Nausea And Vomiting and Rash     Physical Exam:   Vitals  Blood pressure 126/64, pulse 92, temperature 99 F (37.2 C), temperature source Rectal, resp. rate (!) 22, height 5' 8"  (1.727 m), weight 61.2 kg, SpO2 97 %.  1.   General: axoxo3  2. Psychiatric: euthymic  3. Neurologic: cn2-12 intact, reflexes 2+ symmetric, diffuse with no clonus, motor 5/5 in all 4 ext  4. HEENMT:  Icteric, pupils  1.98m symmetric, direct, consensual, near intact Neck: no jvd  5. Respiratory : CTAB  6. Cardiovascular : rrr s1, s2,   7. Gastrointestinal:  Abd: soft, nt, nd, +bs  8. Skin:  Ext: no c/c,  1+ edema, No rash, no palmar erythemia  9.Musculoskeletal:  Good ROM,     Data Review:    CBC Recent Labs  Lab 12/08/18 2346  WBC 8.1  HGB 7.7*  HCT 24.3*  PLT 58*  MCV 96.8  MCH 30.7  MCHC 31.7  RDW 17.8*  LYMPHSABS 0.6*  MONOABS 0.5  EOSABS 0.1  BASOSABS 0.0   ------------------------------------------------------------------------------------------------------------------  Results for orders placed or performed during the hospital encounter of 12/08/18 (from the past 48 hour(s))  CK     Status: None   Collection Time: 12/08/18 11:46 PM  Result Value Ref Range   Total CK 51 38 - 234 U/L    Comment: Performed at [facility] [address] [place] [address] Comprehensive metabolic panel     Status: Abnormal   Collection Time: 12/08/18 11:46 PM  Result Value Ref Range   Sodium 139 135 - 145 mmol/L   Potassium 2.9 (L) 3.5 - 5.1 mmol/L   Chloride 116 (H) 98 - 111 mmol/L   CO2 16 (L) 22 - 32 mmol/L   Glucose, Bld 99 70 - 99 mg/dL   BUN 14 8 - 23 mg/dL   Creatinine, Ser 1.48 (H) 0.44 - 1.00 mg/dL   Calcium 9.5 8.9 - 10.3 mg/dL   Total Protein 5.5 (L) 6.5 - 8.1 g/dL   Albumin 2.3 (L) 3.5 - 5.0 g/dL   AST 95 (H) 15 - 41 U/L   ALT 34 0 - 44 U/L   Alkaline Phosphatase 180 (H) 38 - 126 U/L   Total Bilirubin 3.4 (H) 0.3 - 1.2 mg/dL   GFR calc non Af Amer 38 (L) >60 mL/min   GFR calc Af Amer 44 (L) >60 mL/min   Anion gap 7 5 - 15    Comment: Performed at [facility] [address] [place] [id number] CBC WITH DIFFERENTIAL     Status: Abnormal   Collection  Time: 12/08/18 11:46 PM  Result Value Ref Range   WBC 8.1 4.0 - 10.5 K/uL   RBC 2.51 (L) 3.87 - 5.11 MIL/uL   Hemoglobin 7.7 (L) 12.0 - 15.0 g/dL   HCT 24.3 (L) 36.0 - 46.0 %   MCV 96.8 80.0 - 100.0 fL   MCH 30.7 26.0 - 34.0 pg   MCHC 31.7 30.0 - 36.0 g/dL   RDW 17.8 (H) 11.5 - 15.5 %   Platelets 58 (L) 150 - 400 K/uL    Comment: REPEATED TO VERIFY PLATELET COUNT CONFIRMED BY SMEAR Immature Platelet Fraction may be clinically indicated, consider ordering this additional test [id number]   nRBC 0.0 0.0 - 0.2 %   Neutrophils Relative % 84 %   Neutro Abs 6.9 1.7 - 7.7 K/uL   Lymphocytes Relative 7 %   Lymphs Abs 0.6 (L) 0.7 - 4.0 K/uL   Monocytes Relative 6 %   Monocytes Absolute 0.5 0.1 - 1.0 K/uL   Eosinophils Relative 2 %   Eosinophils Absolute 0.1 0.0 - 0.5 K/uL   Basophils Relative 1 %   Basophils Absolute 0.0 0.0 - 0.1 K/uL   Immature Granulocytes 0 %   Abs Immature Granulocytes 0.02 0.00 - 0.07 K/uL   Ovalocytes PRESENT     Comment:  Performed at [facility], [place] [address], [place], [place] [address]  APTT     Status: Abnormal   Collection Time: 12/08/18 11:46 PM  Result Value Ref Range   aPTT 37 (H) 24 - 36 seconds    Comment:        IF BASELINE aPTT IS ELEVATED, SUGGEST PATIENT RISK ASSESSMENT BE USED TO DETERMINE APPROPRIATE ANTICOAGULANT THERAPY. Performed at [facility], [place] [address] [place], [place] [address]   Protime-INR     Status: Abnormal   Collection Time: 12/08/18 11:46 PM  Result Value Ref Range   Prothrombin Time 19.8 (H) 11.4 - 15.2 seconds   INR 1.7 (H) 0.8 - 1.2    Comment: (NOTE) INR goal varies based on device and disease states. Performed at [facility], [place] [address] [place], [place] [address]   Ethanol     Status: None   Collection Time: 12/08/18 11:47 PM  Result Value Ref Range   Alcohol, Ethyl (B) <10 <10 mg/dL    Comment: (NOTE) Lowest detectable limit for serum alcohol is 10 mg/dL. For medical purposes  only. Performed at [facility], [place] [address], [place], [place] [address]   Ammonia     Status: Abnormal   Collection Time: 12/09/18 12:16 AM  Result Value Ref Range   Ammonia 60 (H) 9 - 35 umol/L    Comment: Performed at [facility], [place] [address], [place], [place] [address]  Urinalysis, Routine w reflex microscopic     Status: Abnormal   Collection Time: 12/09/18 12:53 AM  Result Value Ref Range   Color, Urine YELLOW YELLOW   APPearance CLEAR CLEAR   Specific Gravity, Urine 1.014 1.005 - 1.030   pH 6.0 5.0 - 8.0   Glucose, UA 150 (A) NEGATIVE mg/dL   Hgb urine dipstick SMALL (A) NEGATIVE   Bilirubin Urine NEGATIVE NEGATIVE   Ketones, ur NEGATIVE NEGATIVE mg/dL   Protein, ur 30 (A) NEGATIVE mg/dL   Nitrite NEGATIVE NEGATIVE   Leukocytes,Ua NEGATIVE NEGATIVE   RBC / HPF 0-5 0 - 5 RBC/hpf   WBC, UA 0-5 0 - 5 WBC/hpf   Bacteria, UA NONE SEEN NONE SEEN   Squamous Epithelial / LPF 0-5 0 - 5   Mucus PRESENT     Comment: Performed at [facility], [place] [address], [place], [place] [address]  Urine rapid drug screen (hosp performed)not at [facility]     Status: Abnormal   Collection Time: 12/09/18 12:54 AM  Result Value Ref Range   Opiates NONE DETECTED NONE DETECTED   Cocaine NONE DETECTED NONE DETECTED   Benzodiazepines POSITIVE (A) NONE DETECTED   Amphetamines NONE DETECTED NONE DETECTED   Tetrahydrocannabinol NONE DETECTED NONE DETECTED   Barbiturates NONE DETECTED NONE DETECTED    Comment: (NOTE) DRUG SCREEN FOR MEDICAL PURPOSES ONLY.  IF CONFIRMATION IS NEEDED FOR ANY PURPOSE, NOTIFY LAB WITHIN 5 DAYS. LOWEST DETECTABLE LIMITS FOR URINE DRUG SCREEN Drug Class                     Cutoff (ng/mL) Amphetamine and metabolites    1000 Barbiturate and metabolites    200 Benzodiazepine                 992 Tricyclics and metabolites     300 Opiates and metabolites        300 Cocaine and metabolites        300 THC  50 Performed at  [facility], [place] [address] [place], [place] [address]   POC SARS Coronavirus 2 Ag-ED - Nasal Swab (BD Veritor Kit)     Status: None   Collection Time: 12/09/18  1:22 AM  Result Value Ref Range   SARS Coronavirus 2 Ag NEGATIVE NEGATIVE    Comment: (NOTE) SARS-CoV-2 antigen NOT DETECTED.  Negative results are presumptive.  Negative results do not preclude SARS-CoV-2 infection and should not be used as the sole basis for treatment or other patient management decisions, including infection  control decisions, particularly in the presence of clinical signs and  symptoms consistent with COVID-19, or in those who have been in contact with the virus.  Negative results must be combined with clinical observations, patient history, and epidemiological information. The expected result is Negative. Fact Sheet for Patients: PodPark.tn Fact Sheet for Healthcare Providers: GiftContent.is This test is not yet approved or cleared by the [place] FDA and  has been authorized for detection and/or diagnosis of SARS-CoV-2 by FDA under an Emergency Use Authorization (EUA).  This EUA will remain in effect (meaning this test can be used) for the duration of  the COVID-19 de claration under Section 564(b)(1) of the Act, 21 U.S.C. section 360bbb-3(b)(1), unless the authorization is terminated or revoked sooner.   Lactic acid, plasma     Status: None   Collection Time: 12/09/18  1:46 AM  Result Value Ref Range   Lactic Acid, Venous 1.5 0.5 - 1.9 mmol/L    Comment: Performed at [facility] [address], [place], [place] [address]    Chemistries  Recent Labs  Lab 12/08/18 2346  NA 139  K 2.9*  CL 116*  CO2 16*  GLUCOSE 99  BUN 14  CREATININE 1.48*  CALCIUM 9.5  AST 95*  ALT 34  ALKPHOS 180*  BILITOT 3.4*    ------------------------------------------------------------------------------------------------------------------  ------------------------------------------------------------------------------------------------------------------ GFR: Estimated Creatinine Clearance: 38.1 mL/min (A) (by C-G formula based on SCr of 1.48 mg/dL (H)). Liver Function Tests: Recent Labs  Lab 12/08/18 2346  AST 95*  ALT 34  ALKPHOS 180*  BILITOT 3.4*  PROT 5.5*  ALBUMIN 2.3*   No results for input(s): LIPASE, AMYLASE in the last 168 hours. Recent Labs  Lab 12/09/18 0016  AMMONIA 60*   Coagulation Profile: Recent Labs  Lab 12/08/18 2346  INR 1.7*   Cardiac Enzymes: Recent Labs  Lab 12/08/18 2346  CKTOTAL 51   BNP (last 3 results) No results for input(s): PROBNP in the last 8760 hours. HbA1C: No results for input(s): HGBA1C in the last 72 hours. CBG: No results for input(s): GLUCAP in the last 168 hours. Lipid Profile: No results for input(s): CHOL, HDL, LDLCALC, TRIG, CHOLHDL, LDLDIRECT in the last 72 hours. Thyroid Function Tests: No results for input(s): TSH, T4TOTAL, FREET4, T3FREE, THYROIDAB in the last 72 hours. Anemia Panel: No results for input(s): VITAMINB12, FOLATE, FERRITIN, TIBC, IRON, RETICCTPCT in the last 72 hours.  --------------------------------------------------------------------------------------------------------------- Urine analysis:    Component Value Date/Time   COLORURINE YELLOW 12/09/2018 0053   APPEARANCEUR CLEAR 12/09/2018 0053   LABSPEC 1.014 12/09/2018 0053   PHURINE 6.0 12/09/2018 0053   GLUCOSEU 150 (A) 12/09/2018 0053   HGBUR SMALL (A) 12/09/2018 0053   BILIRUBINUR NEGATIVE 12/09/2018 0053   KETONESUR NEGATIVE 12/09/2018 0053   PROTEINUR 30 (A) 12/09/2018 0053   NITRITE NEGATIVE 12/09/2018 0053   LEUKOCYTESUR NEGATIVE 12/09/2018 0053      Imaging Results:    Ct Head  Wo Contrast  Result Date: 12/09/2018 CLINICAL DATA:  Altered level of  consciousness, weakness, unsteady gait EXAM: CT HEAD WITHOUT CONTRAST TECHNIQUE: Contiguous axial images were obtained from the base of the skull through the vertex without intravenous contrast. COMPARISON:  CT head 06/25/2018 FINDINGS: Brain: No evidence of acute infarction, hemorrhage, hydrocephalus, extra-axial collection or mass lesion/mass effect. Symmetric prominence of the ventricles, cisterns and sulci compatible with parenchymal volume loss. Patchy areas of white matter hypoattenuation are most compatible with chronic microvascular angiopathy. Vascular: Atherosclerotic calcification of the carotid siphons. No hyperdense vessel. Skull: No calvarial fracture or suspicious osseous lesion. No scalp swelling or hematoma. Sinuses/Orbits: Chronic subtotal opacification of the right maxillary sinus with high attenuation material. Mild sclerotic mural thickening of the right maxillary sinus walls. No abnormal stranding seen along the sinus perimeter. Remainder of the paranasal sinuses are predominantly clear aside from mild mural disease. Mastoid air cells are well aerated. Middle ear cavities are clear. Debris noted in the right external auditory canal. Included orbital structures are unremarkable. Other: None IMPRESSION: 1. No acute intracranial abnormality. 2. Mild parenchymal atrophy and chronic microvascular angiopathy. 3. Chronic right maxillary sinusitis. High attenuation material and reactive thickening of the sinus walls suggesting chronic allergic fungal sinusitis. Electronically Signed   By: Lovena Le M.D.   On: 12/09/2018 02:27   Dg Chest Port 1 View  Result Date: 12/09/2018 CLINICAL DATA:  Fever and altered mental status EXAM: PORTABLE CHEST 1 VIEW COMPARISON:  10/30/2018 FINDINGS: Mild cardiomegaly. No focal airspace consolidation or overt pulmonary edema. No pleural effusion or pneumothorax. IMPRESSION: Mild cardiomegaly without focal airspace disease or overt pulmonary edema. Electronically  Signed   By: Ulyses Jarred M.D.   On: 12/09/2018 00:14       Assessment & Plan:    Principal Problem:   Weakness Active Problems:   Thrombocytopenia (HCC)   Elevated liver function tests   Anemia of chronic illness  Difficulty walking secondary to edema, hypokalemia and FTT PT to evaluate and tx  Weakness Likely due to chronic illness  Hypokalemia Replete Add magnesium, please follow up Check cmp in am  Non AG acidosis Secondary to ? diarrhea , Doubt RTA Continue bicarbonate  Abnormal liver function Secondary to cirrhosis Encouraged patient not to drink ETOH Please ensure that patient has follow up with GI  Severe protein calorie malnutrition prostat 21m po bid  H/o C. Diff Completed vanco, monitor Cont Florastor  ETOH abuse ? Cont thiamine Cont folic acid  Anemia/ thrombocytopenia Check cbc in am  DVT Prophylaxis-   - SCDs   AM Labs Ordered, also please review Full Orders  Family Communication: Admission, patients condition and plan of care including tests being ordered have been discussed with the patient  who indicate understanding and agree with the plan and Code Status.  Code Status:  FULL CODE per patient,   Admission status: Observation/: Based on patients clinical presentation and evaluation of above clinical data, I have made determination that patient meets observation criteria at this time.   Time spent in minutes : 55 minutes   JJani GravelM.D on 12/09/2018 at 5:10 AM

## 2018-12-09 NOTE — ED Notes (Signed)
Pt. Visibly trembled and shook while standing the 3 minutes for orthostatic vital. Pt. Was able to complete task but visibly weak

## 2018-12-09 NOTE — NC FL2 (Signed)
[place] LEVEL OF CARE SCREENING TOOL     IDENTIFICATION  Patient Name: Dana Watkins Birthdate: 11-14-1955 Sex: female Admission Date (Current Location): 12/08/2018  [facility] and [place] Number:  Herbalist and Address:  [facility] [place], [place] [address], [place], [place] [address]      Provider Number: [id number]  Attending Physician Name and Address:  Alma Friendly, MD  Relative Name and Phone Number:       Current Level of Care: Hospital Recommended Level of Care: [place] Prior Approval Number:    Date Approved/Denied:   PASRR Number: [id number]  Discharge Plan: SNF    Current Diagnoses: Patient Active Problem List   Diagnosis Date Noted  . Weakness 12/09/2018  . Hyperbilirubinemia 10/31/2018  . C. difficile diarrhea 10/31/2018  . Other cirrhosis of liver ([place]) 10/31/2018  . Systolic murmur at cardiac apex 10/31/2018  . Sepsis ([place]) 10/30/2018  . Normocytic anemia 10/23/2018  . SIRS (systemic inflammatory response syndrome) ([place]) 10/16/2018  . Acute renal failure superimposed on stage 3 chronic kidney disease ([place]) 10/16/2018  . Acute lower UTI 10/16/2018  . Elevated troponin 10/16/2018  . Elevated d-dimer 10/16/2018  . UTI (urinary tract infection) 06/25/2018  . Community acquired pneumonia of right middle lobe of lung 06/09/2018  . AKI (acute kidney injury) ([place]) 06/09/2018  . GAD (generalized anxiety disorder) 11/06/2017  . Abnormal findings on diagnostic imaging of heart and coronary circulation   . Family history of coronary artery disease 05/09/2017  . DOE (dyspnea on exertion) 05/09/2017  . Coronary artery calcification seen on CT scan 03/08/2017  . Acute bronchitis 02/06/2017  . Dehydration   . Diarrhea 02/03/2017  . Arthritis 05/15/2015  . Benign essential hypertension 05/15/2015  . Migraine headache 05/15/2015  . Hypokalemia 11/26/2014  . Alkaline phosphatase elevation  11/26/2014  . Abnormal weight gain 11/26/2014  . Chronic leukopenia 05/28/2014  . Anemia of chronic illness 05/28/2014  . Thrombocytopenia ([place]) 04/30/2013  . Elevated liver function tests 04/30/2013    Orientation RESPIRATION BLADDER Height & Weight     Self, Time, Situation, Place  Normal Continent, External catheter Weight: 153 lb 14.1 oz (69.8 kg) Height:  5' 8"  (172.7 cm)  BEHAVIORAL SYMPTOMS/MOOD NEUROLOGICAL BOWEL NUTRITION STATUS      Continent Diet(see discharge summary)  AMBULATORY STATUS COMMUNICATION OF NEEDS Skin   Extensive Assist Verbally Normal                       Personal Care Assistance Level of Assistance  Dressing, Bathing, Feeding Bathing Assistance: Maximum assistance Feeding assistance: Independent Dressing Assistance: Maximum assistance     Functional Limitations Info  Sight, Hearing, Speech Sight Info: Adequate Hearing Info: Adequate Speech Info: Adequate    SPECIAL CARE FACTORS FREQUENCY  OT (By licensed OT), PT (By licensed PT)     PT Frequency: 5x week OT Frequency: 5x week            Contractures Contractures Info: Not present    Additional Factors Info  Code Status, Allergies, Isolation Precautions Code Status Info: Full Code Allergies Info: Sulfa Antibiotics     Isolation Precautions Info: Contact for ESBL     Current Medications (12/09/2018):  This is the current hospital active medication list Current Facility-Administered Medications  Medication Dose Route Frequency Provider Last Rate Last Dose  . 0.9 %  sodium chloride infusion  250 mL Intravenous PRN Jani Gravel, MD      .  cholecalciferol (VITAMIN D3) tablet 2,000 Units  2,000 Units Oral Daily Jani Gravel, MD   2,000 Units at 12/09/18 1032  . diclofenac sodium (VOLTAREN) 1 % transdermal gel 2 g  2 g Topical QID Jani Gravel, MD   2 g at 12/09/18 1549  . feeding supplement (PRO-STAT SUGAR FREE 64) liquid 30 mL  30 mL Oral BID Jani Gravel, MD      . folic acid (FOLVITE)  tablet 1 mg  1 mg Oral Daily Jani Gravel, MD   1 mg at 12/09/18 1032  . multivitamin with minerals tablet 1 tablet  1 tablet Oral Daily Jani Gravel, MD   1 tablet at 12/09/18 1032  . ondansetron (ZOFRAN) injection 4 mg  4 mg Intravenous Q6H PRN Alma Friendly, MD   4 mg at 12/09/18 0741  . ondansetron (ZOFRAN) tablet 4 mg  4 mg Oral Q6H PRN Jani Gravel, MD      . potassium chloride SA (KLOR-CON) CR tablet 40 mEq  40 mEq Oral Daily Jani Gravel, MD   40 mEq at 12/09/18 1032  . saccharomyces boulardii (FLORASTOR) capsule 250 mg  250 mg Oral BID Jani Gravel, MD   250 mg at 12/09/18 1032  . sodium bicarbonate tablet 650 mg  650 mg Oral BID Jani Gravel, MD   650 mg at 12/09/18 1032  . sodium chloride flush (NS) 0.9 % injection 3 mL  3 mL Intravenous Q12H Jani Gravel, MD   3 mL at 12/09/18 1127  . sodium chloride flush (NS) 0.9 % injection 3 mL  3 mL Intravenous PRN Jani Gravel, MD      . thiamine (VITAMIN B-1) tablet 100 mg  100 mg Oral Daily Jani Gravel, MD   100 mg at 12/09/18 1032     Discharge Medications: Please see discharge summary for a list of discharge medications.  Relevant Imaging Results:  Relevant Lab Results:   Additional Information SS# [place], [place]

## 2018-12-09 NOTE — Sepsis Progress Note (Signed)
Notified bedside nurse of need to administer antibiotics and also an order.

## 2018-12-09 NOTE — Plan of Care (Signed)

## 2018-12-09 NOTE — Progress Notes (Signed)
   12/09/18 0523  MEWS Score  Pulse Rate 99  BP 139/77  Temp 99.6 F (37.6 C)  SpO2 100 %  O2 Device Room Air  MEWS Score  MEWS RR 1  MEWS Pulse 0  MEWS Systolic 0  MEWS LOC 0  MEWS Temp 0  MEWS Score 1  MEWS Score Color Green  MEWS Assessment  Is this an acute change? No   Lab Results WBC  Date/Time Value Ref Range Status  12/08/2018 11:46 PM 8.1 4.0 - 10.5 K/uL Final  11/02/2018 05:18 AM 5.6 4.0 - 10.5 K/uL Final  11/01/2018 05:20 AM 11.0 (H) 4.0 - 10.5 K/uL Final   NEUT%  Date/Time Value Ref Range Status  11/25/2014 03:32 PM 54.2 38.4 - 76.8 % Final  05/13/2014 08:47 AM 50.8 38.4 - 76.8 % Final  04/30/2013 02:26 PM 59.5 38.4 - 76.8 % Final   Neutrophils Relative %  Date/Time Value Ref Range Status  12/08/2018 11:46 PM 84 % Final  10/31/2018 03:22 AM 87 % Final  10/24/2018 08:30 AM 62 % Final   No results found for: PCO2ART Lactic Acid, Venous  Date/Time Value Ref Range Status  12/09/2018 01:46 AM 1.5 0.5 - 1.9 mmol/L Final    Comment:    Performed at [facility], [place] [address], [place], [place] [address]  10/31/2018 11:43 AM 2.4 (HH) 0.5 - 1.9 mmol/L Final    Comment:    CRITICAL VALUE NOTED.  VALUE IS CONSISTENT WITH PREVIOUSLY REPORTED AND CALLED VALUE. Performed at [facility], [place] [address], [place], [place] [address]   10/31/2018 06:51 AM 2.4 (HH) 0.5 - 1.9 mmol/L Final    Comment:    CRITICAL VALUE NOTED.  VALUE IS CONSISTENT WITH PREVIOUSLY REPORTED AND CALLED VALUE. Performed at [facility], [place] [address] [place], [place] [address]    pCO2, Ven  Date/Time Value Ref Range Status  10/30/2018 06:10 PM 30.5 (L) 44.0 - 60.0 mmHg Final

## 2018-12-09 NOTE — Progress Notes (Signed)
Potassium level returned at 2.7. Lab called to report critical result. Lesia Sago, MD notified.

## 2018-12-09 NOTE — Progress Notes (Signed)
Rehab Admissions Coordinator Note:  Per PT recommendation, patient was screened by Michel Santee for appropriateness for an Inpatient Acute Rehab Consult. Noted pt is observation status at this time, with diagnosis of BLE edema, and failure to thrive. Pt does not have the medical necessity to warrant an inpatient rehab stay at this time.  Recommend f/u at a lower level of care.   Shann Medal, PT, DPT Admissions Coordinator [phone] 12/09/18  11:34 AM

## 2018-12-09 NOTE — Progress Notes (Signed)
Dana Watkins was admitted to 5w34 from the ED via stretcher.  The patient transferred from stretcher to bed without incident.  The patient is alert and oriented x4.  Bed is in the lowest position, bed alarm activated due to recent orthostatic hypotension.  Instructed patient to call for assistance with ambulation, patient indicated understanding.  Call bell and telephone are within reach.  Explained to patient how to use call bell and telephone, patient indicated understanding.  Admission booklet given.  Visitation restrictions reviewed with the patient, patient indicated understanding.  Patient denies any further needs at this time.

## 2018-12-10 DIAGNOSIS — A0471 Enterocolitis due to Clostridium difficile, recurrent: Principal | ICD-10-CM

## 2018-12-10 DIAGNOSIS — D696 Thrombocytopenia, unspecified: Secondary | ICD-10-CM

## 2018-12-10 DIAGNOSIS — D638 Anemia in other chronic diseases classified elsewhere: Secondary | ICD-10-CM

## 2018-12-10 LAB — CBC WITH DIFFERENTIAL/PLATELET
Abs Immature Granulocytes: 0.1 10*3/uL — ABNORMAL HIGH (ref 0.00–0.07)
Basophils Absolute: 0 10*3/uL (ref 0.0–0.1)
Basophils Relative: 0 %
Eosinophils Absolute: 0.1 10*3/uL (ref 0.0–0.5)
Eosinophils Relative: 1 %
HCT: 22.2 % — ABNORMAL LOW (ref 36.0–46.0)
Hemoglobin: 7.4 g/dL — ABNORMAL LOW (ref 12.0–15.0)
Immature Granulocytes: 1 %
Lymphocytes Relative: 11 %
Lymphs Abs: 1.5 10*3/uL (ref 0.7–4.0)
MCH: 31.5 pg (ref 26.0–34.0)
MCHC: 33.3 g/dL (ref 30.0–36.0)
MCV: 94.5 fL (ref 80.0–100.0)
Monocytes Absolute: 1.2 10*3/uL — ABNORMAL HIGH (ref 0.1–1.0)
Monocytes Relative: 9 %
Neutro Abs: 11 10*3/uL — ABNORMAL HIGH (ref 1.7–7.7)
Neutrophils Relative %: 78 %
Platelets: 64 10*3/uL — ABNORMAL LOW (ref 150–400)
RBC: 2.35 MIL/uL — ABNORMAL LOW (ref 3.87–5.11)
RDW: 17.5 % — ABNORMAL HIGH (ref 11.5–15.5)
WBC: 14 10*3/uL — ABNORMAL HIGH (ref 4.0–10.5)
nRBC: 0 % (ref 0.0–0.2)

## 2018-12-10 LAB — URINE CULTURE: Culture: NO GROWTH

## 2018-12-10 LAB — COMPREHENSIVE METABOLIC PANEL
ALT: 27 U/L (ref 0–44)
AST: 68 U/L — ABNORMAL HIGH (ref 15–41)
Albumin: 2.1 g/dL — ABNORMAL LOW (ref 3.5–5.0)
Alkaline Phosphatase: 132 U/L — ABNORMAL HIGH (ref 38–126)
Anion gap: 7 (ref 5–15)
BUN: 16 mg/dL (ref 8–23)
CO2: 16 mmol/L — ABNORMAL LOW (ref 22–32)
Calcium: 8.7 mg/dL — ABNORMAL LOW (ref 8.9–10.3)
Chloride: 113 mmol/L — ABNORMAL HIGH (ref 98–111)
Creatinine, Ser: 1.49 mg/dL — ABNORMAL HIGH (ref 0.44–1.00)
GFR calc Af Amer: 43 mL/min — ABNORMAL LOW (ref >60)
GFR calc non Af Amer: 37 mL/min — ABNORMAL LOW (ref >60)
Glucose, Bld: 118 mg/dL — ABNORMAL HIGH (ref 70–99)
Potassium: 2.7 mmol/L — CL (ref 3.5–5.1)
Sodium: 136 mmol/L (ref 135–145)
Total Bilirubin: 5 mg/dL — ABNORMAL HIGH (ref 0.3–1.2)
Total Protein: 5 g/dL — ABNORMAL LOW (ref 6.5–8.1)

## 2018-12-10 LAB — RPR: RPR Ser Ql: NONREACTIVE

## 2018-12-10 LAB — AMMONIA: Ammonia: 31 umol/L (ref 9–35)

## 2018-12-10 MED ORDER — LACTULOSE 10 GM/15ML PO SOLN: 10.0000 g | Freq: Every day | ORAL | Status: DC

## 2018-12-10 MED ORDER — SODIUM CHLORIDE 0.9 % IV SOLN
INTRAVENOUS | Status: DC
  Administered 2018-12-10 – 2018-12-11 (×2): via INTRAVENOUS

## 2018-12-10 MED ORDER — VANCOMYCIN 50 MG/ML ORAL SOLUTION
125.0000 mg | Freq: Four times a day (QID) | ORAL | Status: DC
  Administered 2018-12-10 – 2018-12-11 (×7): 125 mg via ORAL
  Filled 2018-12-10 (×9): qty 2.5

## 2018-12-10 MED ORDER — POTASSIUM CHLORIDE 20 MEQ PO PACK
40.0000 meq | PACK | Freq: Two times a day (BID) | ORAL | Status: DC
  Administered 2018-12-10 – 2018-12-11 (×2): 40 meq via ORAL
  Filled 2018-12-10 (×2): qty 2

## 2018-12-10 NOTE — Progress Notes (Signed)
PROGRESS NOTE  Dana Watkins YQM:[id number] DOB: Mar 30, 1955 DOA: 12/08/2018 PCP: Lawerance Cruel, MD  HPI/Recap of past 47 hours: 63 year old female, with history of hypertension, CKD stage III, liver cirrhosis, vitamin D deficiency, recent C. difficile colitis, presents with difficulty walking, generalized weakness.  Patient lives alone, and a wellness check was activated by her friends who noted patient has not been answering her phone.  EMS had to make a forced entry because she did not answer the door, was found awake and alert but had generalized weakness.  Patient reports several days of generalized weakness of unclear etiology with some associated lower extremity edema.  No trauma or fall noted.  Of note, patient was recently discharged from the hospital after being managed for C. difficile colitis.  In the ED, patient remained afebrile, with no overt signs of infection.  Labs showed hypokalemia, elevated ammonia, UDS was positive for benzodiazepines, with no recent prescriptions for benzos, CT head negative for any acute intracranial hemorrhage but questionable chronic fungal sinusitis.  Patient admitted for further management.   Today, patient noted to have watery diarrhea (no lactulose given), reports some generalized abdominal pain, denies any nausea/vomiting, fever/chills, chest pain, shortness of breath.  Patient still with generalized weakness.    Assessment/Plan: Principal Problem:   Weakness Active Problems:   Thrombocytopenia (HCC)   Elevated liver function tests   Anemia of chronic illness  Recurrent C. difficile colitis Now with diarrhea (no lactulose given), worsening leukocytosis, afebrile Of note patient initially had C. difficile back in January 2019, had another bout last month of which she was discharged on a tapered dose of p.o. vancomycin (patient could not complete the tapered dose as her insurance would not approve any further) Spoke to ID Dr. Talbot Grumbling  on 12/10/2018, recommended no further testing for C. difficile and start treatment Start p.o. vancomycin, continue probiotics IV fluids Monitor closely  Generalized weakness/failure to thrive Likely due to above UA with glucose, small hemoglobin, mucus otherwise unremarkable BC x2 NGTD, UC no growth CK 51, lactic acidosis 1.5 Ammonia 60-->31 UDS positive for benzodiazepines (not on her med list) Chest x-ray showed mild cardiomegaly without focal airspace disease or overt pulmonary edema CT head with no acute intracranial abnormality, possible chronic allergic fungal sinusitis PT/OT  Hypokalemia Replace as needed  History of liver cirrhosis Ammonia 60-->31 (no lactulose given) No overt ascites on examination/ultrasound Reports only social drinking, alcohol level on admission unremarkable UDS positive for benzodiazepines Noted hyperbilirubinemia, elevated alk phosphatase/INR, thrombocytopenia, all somewhat stable at baseline Right upper quadrant ultrasound showed no evidence of cholecystitis or biliary obstruction, small volume ascites Monitor closely Daily CMP Advised to abstain from alcohol  CKD stage III with metabolic acidosis Creatinine at baseline IV fluids, continue p.o. bicarb Daily BMP  Anemia of chronic disease/thrombocytopenia Hemoglobin/PLT somewhat at baseline Likely due to liver cirrhosis Anemia panel showed iron 209, saturation 79, vitamin B12 1207 Daily CBC       Malnutrition Type:      Malnutrition Characteristics:      Nutrition Interventions:       Estimated body mass index is 23.87 kg/m as calculated from the following:   Height as of this encounter: 5' 8"  (1.727 m).   Weight as of this encounter: 71.2 kg.     Code Status: Full  Family Communication: None at bedside  Disposition Plan: To be determined   Consultants:  Spoke to ID on 12/10/2018  Procedures:  None  Antimicrobials:  P.o. vancomycin  DVT prophylaxis:  SCDs (significant thrombocytopenia)   Objective: Vitals:   12/09/18 2342 12/10/18 0500 12/10/18 0606 12/10/18 1247  BP: (!) 142/76  131/73 125/70  Pulse: 94  97 83  Resp: 18   18  Temp: 98 F (36.7 C)  99.7 F (37.6 C) 99 F (37.2 C)  TempSrc: Oral   Oral  SpO2:   98% 100%  Weight:  71.2 kg    Height:        Intake/Output Summary (Last 24 hours) at 12/10/2018 1606 Last data filed at 12/10/2018 0429 Gross per 24 hour  Intake 1406.28 ml  Output -  Net 1406.28 ml   Filed Weights   12/09/18 0040 12/09/18 0532 12/10/18 0500  Weight: 61.2 kg 69.8 kg 71.2 kg    Exam:  General: NAD, lethargic, chronically ill-appearing, deconditioned  Cardiovascular: S1, S2 present  Respiratory:  Mildly diminished breath sounds at the bases bilaterally  Abdomen: Soft, generalized tenderness, nondistended, bowel sounds present  Musculoskeletal: Trace bilateral pedal edema noted  Skin: Normal  Psychiatry: Normal mood  Neurology: No obvious focal neurologic deficits noted   Data Reviewed: CBC: Recent Labs  Lab 12/08/18 2346 12/09/18 1518 12/10/18 0808  WBC 8.1 11.5* 14.0*  NEUTROABS 6.9 9.2* 11.0*  HGB 7.7* 7.3* 7.4*  HCT 24.3* 23.4* 22.2*  MCV 96.8 97.5 94.5  PLT 58* 58* 64*   Basic Metabolic Panel: Recent Labs  Lab 12/08/18 2346 12/09/18 0826 12/09/18 1518 12/10/18 0808  NA 139  --  140 136  K 2.9*  --  2.7* 2.7*  CL 116*  --  117* 113*  CO2 16*  --  14* 16*  GLUCOSE 99  --  106* 118*  BUN 14  --  14 16  CREATININE 1.48*  --  1.50* 1.49*  CALCIUM 9.5  --  9.0 8.7*  MG  --  1.9  --   --    GFR: Estimated Creatinine Clearance: 39.5 mL/min (A) (by C-G formula based on SCr of 1.49 mg/dL (H)). Liver Function Tests: Recent Labs  Lab 12/08/18 2346 12/09/18 1518 12/10/18 0808  AST 95* 77* 68*  ALT 34 30 27  ALKPHOS 180* 137* 132*  BILITOT 3.4* 4.4* 5.0*  PROT 5.5* 5.2* 5.0*  ALBUMIN 2.3* 2.1* 2.1*   No results for input(s): LIPASE, AMYLASE in the last  168 hours. Recent Labs  Lab 12/09/18 0016 12/09/18 1519 12/10/18 0808  AMMONIA 60* 63* 31   Coagulation Profile: Recent Labs  Lab 12/08/18 2346  INR 1.7*   Cardiac Enzymes: Recent Labs  Lab 12/08/18 2346  CKTOTAL 51   BNP (last 3 results) No results for input(s): PROBNP in the last 8760 hours. HbA1C: No results for input(s): HGBA1C in the last 72 hours. CBG: No results for input(s): GLUCAP in the last 168 hours. Lipid Profile: No results for input(s): CHOL, HDL, LDLCALC, TRIG, CHOLHDL, LDLDIRECT in the last 72 hours. Thyroid Function Tests: Recent Labs    12/09/18 1518  TSH 0.752   Anemia Panel: Recent Labs    12/09/18 1518  VITAMINB12 1,207*  FOLATE 31.0  FERRITIN 34  TIBC 266  IRON 209*   Urine analysis:    Component Value Date/Time   COLORURINE YELLOW 12/09/2018 0053   APPEARANCEUR CLEAR 12/09/2018 0053   LABSPEC 1.014 12/09/2018 0053   PHURINE 6.0 12/09/2018 0053   GLUCOSEU 150 (A) 12/09/2018 0053   HGBUR SMALL (A) 12/09/2018 [place] NEGATIVE 12/09/2018 0053   [place] 12/09/2018 0053  PROTEINUR 30 (A) 12/09/2018 0053   NITRITE NEGATIVE 12/09/2018 0053   LEUKOCYTESUR NEGATIVE 12/09/2018 0053   Sepsis Labs: @LABRCNTIP (procalcitonin:4,lacticidven:4)  ) Recent Results (from the past 240 hour(s))  Blood Culture (routine x 2)     Status: None (Preliminary result)   Collection Time: 12/08/18 12:30 AM   Specimen: BLOOD LEFT FOREARM  Result Value Ref Range Status   Specimen Description BLOOD LEFT FOREARM  Final   Special Requests   Final    BOTTLES DRAWN AEROBIC AND ANAEROBIC Blood Culture adequate volume   Culture   Final    NO GROWTH 1 DAY Performed at [facility], [place] [address], [place], [place] [address]    Report Status PENDING  Incomplete  Blood Culture (routine x 2)     Status: None (Preliminary result)   Collection Time: 12/08/18  1:36 AM   Specimen: BLOOD  Result Value Ref Range Status   Specimen  Description BLOOD RIGHT ANTECUBITAL  Final   Special Requests   Final    BOTTLES DRAWN AEROBIC AND ANAEROBIC Blood Culture adequate volume   Culture   Final    NO GROWTH 1 DAY Performed at [facility], [place] [address], [place], [place] [address]    Report Status PENDING  Incomplete  Urine culture     Status: None   Collection Time: 12/09/18 12:38 AM   Specimen: In/Out Cath Urine  Result Value Ref Range Status   Specimen Description IN/OUT CATH URINE  Final   Special Requests NONE  Final   Culture   Final    NO GROWTH Performed at [facility], [place] [address] [place], [place] [address]    Report Status 12/10/2018 FINAL  Final  SARS CORONAVIRUS 2 (TAT 6-24 HRS) Nasopharyngeal Nasopharyngeal Swab     Status: None   Collection Time: 12/09/18  2:46 AM   Specimen: Nasopharyngeal Swab  Result Value Ref Range Status   SARS Coronavirus 2 NEGATIVE NEGATIVE Final    Comment: (NOTE) SARS-CoV-2 target nucleic acids are NOT DETECTED. The SARS-CoV-2 RNA is generally detectable in upper and lower respiratory specimens during the acute phase of infection. Negative results do not preclude SARS-CoV-2 infection, do not rule out co-infections with other pathogens, and should not be used as the sole basis for treatment or other patient management decisions. Negative results must be combined with clinical observations, patient history, and epidemiological information. The expected result is Negative. Fact Sheet for Patients: SugarRoll.be Fact Sheet for Healthcare Providers: https://www.woods-mathews.com/ This test is not yet approved or cleared by the [place] FDA and  has been authorized for detection and/or diagnosis of SARS-CoV-2 by FDA under an Emergency Use Authorization (EUA). This EUA will remain  in effect (meaning this test can be used) for the duration of the COVID-19 declaration under Section 56 4(b)(1) of the Act, 21 U.S.C.  section 360bbb-3(b)(1), unless the authorization is terminated or revoked sooner. Performed at [facility], [place] [address] [place], [place] [address]       Studies: No results found.  Scheduled Meds: . cholecalciferol  2,000 Units Oral Daily  . diclofenac sodium  2 g Topical QID  . feeding supplement (PRO-STAT SUGAR FREE 64)  30 mL Oral BID  . folic acid  1 mg Oral Daily  . multivitamin with minerals  1 tablet Oral Daily  . potassium chloride  40 mEq Oral BID  . saccharomyces boulardii  250 mg Oral BID  . sodium bicarbonate  650 mg Oral BID  . sodium chloride flush  3 mL Intravenous Q12H  . thiamine  100 mg Oral Daily  . vancomycin  125 mg Oral QID    Continuous Infusions: . sodium chloride       LOS: 1 day     Alma Friendly, MD [facility]  If 7PM-7AM, please contact night-coverage [email or web address] 12/10/2018, 4:06 PM

## 2018-12-11 DIAGNOSIS — A498 Other bacterial infections of unspecified site: Secondary | ICD-10-CM

## 2018-12-11 DIAGNOSIS — A0471 Enterocolitis due to Clostridium difficile, recurrent: Secondary | ICD-10-CM

## 2018-12-11 LAB — CBC WITH DIFFERENTIAL/PLATELET
Abs Immature Granulocytes: 0.03 10*3/uL (ref 0.00–0.07)
Basophils Absolute: 0 10*3/uL (ref 0.0–0.1)
Basophils Relative: 0 %
Eosinophils Absolute: 0.4 10*3/uL (ref 0.0–0.5)
Eosinophils Relative: 4 %
HCT: 22.3 % — ABNORMAL LOW (ref 36.0–46.0)
Hemoglobin: 7.2 g/dL — ABNORMAL LOW (ref 12.0–15.0)
Immature Granulocytes: 0 %
Lymphocytes Relative: 16 %
Lymphs Abs: 1.4 10*3/uL (ref 0.7–4.0)
MCH: 31.4 pg (ref 26.0–34.0)
MCHC: 32.3 g/dL (ref 30.0–36.0)
MCV: 97.4 fL (ref 80.0–100.0)
Monocytes Absolute: 0.8 10*3/uL (ref 0.1–1.0)
Monocytes Relative: 9 %
Neutro Abs: 5.9 10*3/uL (ref 1.7–7.7)
Neutrophils Relative %: 71 %
Platelets: 66 10*3/uL — ABNORMAL LOW (ref 150–400)
RBC: 2.29 MIL/uL — ABNORMAL LOW (ref 3.87–5.11)
RDW: 17.5 % — ABNORMAL HIGH (ref 11.5–15.5)
WBC: 8.4 10*3/uL (ref 4.0–10.5)
nRBC: 0 % (ref 0.0–0.2)

## 2018-12-11 LAB — COMPREHENSIVE METABOLIC PANEL
ALT: 28 U/L (ref 0–44)
AST: 59 U/L — ABNORMAL HIGH (ref 15–41)
Albumin: 2.1 g/dL — ABNORMAL LOW (ref 3.5–5.0)
Alkaline Phosphatase: 143 U/L — ABNORMAL HIGH (ref 38–126)
Anion gap: 8 (ref 5–15)
BUN: 16 mg/dL (ref 8–23)
CO2: 16 mmol/L — ABNORMAL LOW (ref 22–32)
Calcium: 9.1 mg/dL (ref 8.9–10.3)
Chloride: 115 mmol/L — ABNORMAL HIGH (ref 98–111)
Creatinine, Ser: 1.42 mg/dL — ABNORMAL HIGH (ref 0.44–1.00)
GFR calc Af Amer: 46 mL/min — ABNORMAL LOW (ref >60)
GFR calc non Af Amer: 39 mL/min — ABNORMAL LOW (ref >60)
Glucose, Bld: 105 mg/dL — ABNORMAL HIGH (ref 70–99)
Potassium: 3 mmol/L — ABNORMAL LOW (ref 3.5–5.1)
Sodium: 139 mmol/L (ref 135–145)
Total Bilirubin: 3.2 mg/dL — ABNORMAL HIGH (ref 0.3–1.2)
Total Protein: 5.1 g/dL — ABNORMAL LOW (ref 6.5–8.1)

## 2018-12-11 LAB — TYPE AND SCREEN
ABO/RH(D): O POS
Antibody Screen: NEGATIVE

## 2018-12-11 MED ORDER — POTASSIUM CHLORIDE 20 MEQ PO PACK: 20.0000 meq | PACK | Freq: Two times a day (BID) | ORAL | 0 refills | Status: DC

## 2018-12-11 MED ORDER — VANCOMYCIN 50 MG/ML ORAL SOLUTION: ORAL | 0 refills | Status: AC

## 2018-12-11 NOTE — TOC Initial Note (Signed)
Transition of Care [facility]) - Initial/Assessment Note    Patient Details  Name: Dana Watkins MRN: [id number] Date of Birth: 10/13/1955  Transition of Care [facility]) CM/SW Contact:    Benard Halsted, LCSW Phone Number: 12/11/2018, 12:52 PM  Clinical Narrative:                 Patient reporting that she is doing better mobility-wise and is requesting to return home. CSW following up on potential home health.   Expected Discharge Plan: [place] Barriers to Discharge: No Barriers Identified   Patient Goals and CMS Choice Patient states their goals for this hospitalization and ongoing recovery are:: Return home CMS Medicare.gov Compare Post Acute Care list provided to:: Patient Choice offered to / list presented to : Patient  Expected Discharge Plan and Services Expected Discharge Plan: [place] In-house Referral: NA Discharge Planning Services: CM Consult   Living arrangements for the past 2 months: Apartment                                      Prior Living Arrangements/Services Living arrangements for the past 2 months: Apartment Lives with:: Self Patient language and need for interpreter reviewed:: Yes Do you feel safe going back to the place where you live?: Yes      Need for Family Participation in Patient Care: No (Comment) Care giver support system in place?: Yes (comment)   Criminal Activity/Legal Involvement Pertinent to Current Situation/Hospitalization: No - Comment as needed  Activities of Daily Living Home Assistive Devices/Equipment: None ADL Screening (condition at time of admission) Patient's cognitive ability adequate to safely complete daily activities?: Yes Is the patient deaf or have difficulty hearing?: No Does the patient have difficulty seeing, even when wearing glasses/contacts?: Yes Does the patient have difficulty concentrating, remembering, or making decisions?: No Patient able to express need for  assistance with ADLs?: Yes Does the patient have difficulty dressing or bathing?: No Independently performs ADLs?: Yes (appropriate for developmental age) Does the patient have difficulty walking or climbing stairs?: No Weakness of Legs: None Weakness of Arms/Hands: None  Permission Sought/Granted Permission sought to share information with : Facility Sport and exercise psychologist, Family Supports Permission granted to share information with : Yes, Verbal Permission Granted              Emotional Assessment Appearance:: Appears stated age Attitude/Demeanor/Rapport: Engaged Affect (typically observed): Accepting, Appropriate Orientation: : Oriented to Self, Oriented to Place, Oriented to  Time, Oriented to Situation Alcohol / Substance Use: Not Applicable Psych Involvement: No (comment)  Admission diagnosis:  Dehydration [E86.0] Hypokalemia [E87.6] Weakness [R53.1] Thrombocytopenia (HCC) [D69.6] AKI (acute kidney injury) ([place]) [N17.9] Hepatic cirrhosis, unspecified hepatic cirrhosis type, unspecified whether ascites present [facility]) [K74.60] Patient Active Problem List   Diagnosis Date Noted  . Weakness 12/09/2018  . Hyperbilirubinemia 10/31/2018  . C. difficile diarrhea 10/31/2018  . Other cirrhosis of liver ([place]) 10/31/2018  . Systolic murmur at cardiac apex 10/31/2018  . Sepsis ([place]) 10/30/2018  . Normocytic anemia 10/23/2018  . SIRS (systemic inflammatory response syndrome) ([place]) 10/16/2018  . Acute renal failure superimposed on stage 3 chronic kidney disease ([place]) 10/16/2018  . Acute lower UTI 10/16/2018  . Elevated troponin 10/16/2018  . Elevated d-dimer 10/16/2018  . UTI (urinary tract infection) 06/25/2018  . Community acquired pneumonia of right middle lobe of lung 06/09/2018  . AKI (acute kidney  injury) ([place]) 06/09/2018  . GAD (generalized anxiety disorder) 11/06/2017  . Abnormal findings on diagnostic imaging of heart and coronary circulation   . Family history of  coronary artery disease 05/09/2017  . DOE (dyspnea on exertion) 05/09/2017  . Coronary artery calcification seen on CT scan 03/08/2017  . Acute bronchitis 02/06/2017  . Dehydration   . Diarrhea 02/03/2017  . Arthritis 05/15/2015  . Benign essential hypertension 05/15/2015  . Migraine headache 05/15/2015  . Hypokalemia 11/26/2014  . Alkaline phosphatase elevation 11/26/2014  . Abnormal weight gain 11/26/2014  . Chronic leukopenia 05/28/2014  . Anemia of chronic illness 05/28/2014  . Thrombocytopenia ([place]) 04/30/2013  . Elevated liver function tests 04/30/2013   PCP:  Lawerance Cruel, MD Pharmacy:   [id number] [place] [place] [address] [place]Phone: [phone]: [phone] [id number] [address] [place]: [phone]: [phone]one: [phone]: [phone]   Social Determinants of Health (SD[place]Interventions    Readmission Risk Interventions Readmission Risk Prevention Plan 12/11/2018  Transportation Screening Complete  PCP or Specialist Appt within 3-5 Days Complete  HRI or Ho[place]plete  Social Work Consult for ReTroylanning/Counseling Complete  Palliative Care Screening Not Applicable  Medication Review (RN Care Manager) Referral to Pharmacy  Some recent data might be hidden

## 2018-12-11 NOTE — Progress Notes (Signed)
Nsg Discharge Note  Admit Date:  12/08/2018 Discharge date: 12/11/2018   Dana Watkins to be D/C'd Home per MD order.  AVS completed.   Patient able to verbalize understanding.  Discharge Medication: Allergies as of 12/11/2018      Reactions   Sulfa Antibiotics Nausea And Vomiting, Rash      Medication List    STOP taking these medications   Klor-Con M20 20 MEQ tablet Generic drug: potassium chloride SA     TAKE these medications   diclofenac sodium 1 % Gel Commonly known as: VOLTAREN Apply 2 g topically 4 (four) times daily.   folic acid 1 MG tablet Commonly known as: FOLVITE Take 1 tablet (1 mg total) by mouth daily.   MAGNESIUM PO Take 1 tablet by mouth daily.   multivitamin with minerals Tabs tablet Take 1 tablet by mouth daily.   ondansetron 4 MG tablet Commonly known as: ZOFRAN Take 1 tablet (4 mg total) by mouth every 6 (six) hours as needed for nausea.   potassium chloride 20 MEQ packet Commonly known as: KLOR-CON Take 20 mEq by mouth 2 (two) times daily for 5 days.   saccharomyces boulardii 250 MG capsule Commonly known as: Florastor Take 1 capsule (250 mg total) by mouth 2 (two) times daily.   sodium bicarbonate 650 MG tablet Take 1 tablet (650 mg total) by mouth 2 (two) times daily.   thiamine 100 MG tablet Take 1 tablet (100 mg total) by mouth daily.   vancomycin 50 mg/mL  oral solution Commonly known as: VANCOCIN Take 2.5 mLs (125 mg total) by mouth 4 (four) times daily for 9 days, THEN 2.5 mLs (125 mg total) 3 (three) times daily for 7 days, THEN 2.5 mLs (125 mg total) 2 (two) times daily for 7 days, THEN 2.5 mLs (125 mg total) daily for 7 days, THEN 2.5 mLs (125 mg total) every other day for 8 days. Start taking on: December 11, 2018 What changed: See the new instructions.   Vitamin D3 50 MCG (2000 UT) Tabs Take 1 tablet by mouth daily.            Durable Medical Equipment  (From admission, onward)         Start     Ordered   12/11/18 1539  For home use only DME 3 n 1  Once     12/11/18 1540          Discharge Assessment: Vitals:   12/10/18 2200 12/11/18 1329  BP: 123/67 130/69  Pulse: 88 89  Resp: 17 18  Temp: 98.9 F (37.2 C) 97.7 F (36.5 C)  SpO2: 99% 100%   Skin clean, dry and intact without evidence of skin break down, no evidence of skin tears noted. IV catheter discontinued intact. Site without signs and symptoms of complications - no redness or edema noted at insertion site, patient denies c/o pain - only slight tenderness at site.  Dressing with slight pressure applied.  D/c Instructions-Education: Discharge instructions given to patient with verbalized understanding.   Pt states she has some vanc at home to take tonight and she will go to the [facility] to get rx filled in the morning.    D/c education completed with patient/family including follow up instructions, medication list, d/c activities limitations if indicated, with other d/c instructions as indicated by MD - patient able to verbalize understanding, all questions fully answered. Patient instructed to return to ED, call 911, or call MD for any changes  in condition.  Patient escorted via [place], and D/C home via private auto.  Hassan Rowan, RN 12/11/2018 5:18 PM

## 2018-12-11 NOTE — Progress Notes (Signed)
Physical Therapy Treatment Patient Details Name: Dana Watkins MRN: [id number] DOB: 1955-01-28 Today's Date: 12/11/2018    History of Present Illness 63yo female with difficulty walking due to weakness and BLE edema. CT negative for acute intracranial changes. Covid negative. Admitted for weakness and BLE edema, failure to thrive. PMH HTN, hx C Diff colitis, CKD, mitral regurg, sepsis    PT Comments    Discussed low HCT with RN, who reports patient is OK to see and may go home today actually. Patient received in bed, pleasant and willing to participate in session, functional status much improved and able to perform functional bed mobility and transfers with S, gait approximately 271f with min guard and no device but fatigued at EOS. Did note some intermittent scissoring and mild unsteadiness but patient able to self correct well. Discussed benefits of HHPT given mild balance disturbance, general weakness, and reduced functional activity tolerance, patient continues to decline HHPT and states her daughter actually will be staying with her/will take care of anything she needs. Educated that if she changes her mind she can get a referral for HHPT or OPPT through her PCP. She was left in bed with all needs met and questions/concerns addressed this afternoon.   Follow Up Recommendations  Home health PT;Supervision for mobility/OOB     Equipment Recommendations  3in1 (PT)    Recommendations for Other Services       Precautions / Restrictions Precautions Precautions: Fall Restrictions Weight Bearing Restrictions: No    Mobility  Bed Mobility Overal bed mobility: Needs Assistance Bed Mobility: Supine to Sit;Sit to Supine     Supine to sit: Supervision Sit to supine: Supervision   General bed mobility comments: S, increased time and effort but no physical assist given  Transfers Overall transfer level: Needs assistance Equipment used: None Transfers: Sit to/from Stand Sit to  Stand: Supervision         General transfer comment: S for safety, no physical assist given  Ambulation/Gait Ambulation/Gait assistance: Min guard Gait Distance (Feet): 200 Feet Assistive device: None Gait Pattern/deviations: Step-through pattern;Drifts right/left;Narrow base of support;Scissoring Gait velocity: WNL   General Gait Details: gait tolerance and speed much improved, continue to note narrow BOS with mild scissoring pattern and min guard for safety/to maintain balance; fatigued at end of gait distance   Stairs             Wheelchair Mobility    Modified Rankin (Stroke Patients Only)       Balance Overall balance assessment: Needs assistance Sitting-balance support: No upper extremity supported;Feet supported Sitting balance-Leahy Scale: Normal Sitting balance - Comments: able to don/doff shoes sitting EOB independently   Standing balance support: No upper extremity supported;During functional activity Standing balance-Leahy Scale: Fair Standing balance comment: narrow BOS with occasional scissoring, min guard for safety but able to self correct                            Cognition Arousal/Alertness: Awake/alert Behavior During Therapy: Flat affect Overall Cognitive Status: Impaired/Different from baseline Area of Impairment: Orientation;Attention;Memory;Following commands;Safety/judgement;Awareness;Problem solving                 Orientation Level: Person;Place;Time;Situation Current Attention Level: Alternating   Following Commands: Follows one step commands consistently;Follows multi-step commands consistently;Follows multi-step commands with increased time Safety/Judgement: Decreased awareness of safety Awareness: Emergent Problem Solving: Requires verbal cues General Comments: cognition much improved today, mild safety deficits (getting in up room by  herself without hospital socks/shoes)but other wise also much improved since  saturday      Exercises      General Comments        Pertinent Vitals/Pain Pain Assessment: No/denies pain    Home Living                      Prior Function            PT Goals (current goals can now be found in the care plan section) Acute Rehab PT Goals Patient Stated Goal: regain independence PT Goal Formulation: With patient Time For Goal Achievement: 12/23/18 Potential to Achieve Goals: Good Progress towards PT goals: Progressing toward goals    Frequency    Min 3X/week      PT Plan Discharge plan needs to be updated;Equipment recommendations need to be updated    Co-evaluation              AM-PAC PT "6 Clicks" Mobility   Outcome Measure  Help needed turning from your back to your side while in a flat bed without using bedrails?: None Help needed moving from lying on your back to sitting on the side of a flat bed without using bedrails?: None Help needed moving to and from a bed to a chair (including a wheelchair)?: A Little Help needed standing up from a chair using your arms (e.g., wheelchair or bedside chair)?: A Little Help needed to walk in hospital room?: A Little Help needed climbing 3-5 steps with a railing? : A Little 6 Click Score: 20    End of Session   Activity Tolerance: Patient limited by fatigue Patient left: with call bell/phone within reach;in bed   PT Visit Diagnosis: Unsteadiness on feet (R26.81);Difficulty in walking, not elsewhere classified (R26.2);Muscle weakness (generalized) (M62.81)     Time: 3750-5107 PT Time Calculation (min) (ACUTE ONLY): 15 min  Charges:  $Gait Training: 8-22 mins                     Windell Norfolk, DPT, PN1   Supplemental Physical Therapist [place]    Pager [phone] Acute Rehab Office [phone]

## 2018-12-11 NOTE — TOC Transition Note (Signed)
Transition of Care [facility]) - CM/SW Discharge Note   Patient Details  Name: Dana Watkins MRN: [id number] Date of Birth: 02/27/1955  [facility] CM/SW Contact:  Benard Halsted, LCSW Phone Number: 12/11/2018, 1:37 PM   Clinical Narrative:    Patient is declining both SNF rehab and home health services. She reports that her daughter will be staying with her at discharge and will be available if she needs anything. She confirmed that she is able to get her medications from her [employer]. No other needs identified at this time and she thanked CSW for the hospital's care.    Final next level of care: Home/Self Care Barriers to Discharge: No Barriers Identified   Patient Goals and CMS Choice Patient states their goals for this hospitalization and ongoing recovery are:: Return home CMS Medicare.gov Compare Post Acute Care list provided to:: Patient Choice offered to / list presented to : Patient  Discharge Placement                       Discharge Plan and Services In-house Referral: NA Discharge Planning Services: CM Consult Post Acute Care Choice: NA          DME Arranged: N/A         HH Arranged: Refused SNF, Refused HH          Social Determinants of Health (SDOH) Interventions     Readmission Risk Interventions Readmission Risk Prevention Plan 12/11/2018  Transportation Screening Complete  PCP or Specialist Appt within 3-5 Days Complete  HRI or [place] Complete  Social Work Consult for [place] Planning/Counseling Complete  Palliative Care Screening Not Applicable  Medication Review [employer]) Referral to Pharmacy  Some recent data might be hidden

## 2018-12-11 NOTE — Discharge Summary (Signed)
Physician Discharge Summary  Dana Watkins GOT:[id number] DOB: 1955/01/15 DOA: 12/08/2018  PCP: Lawerance Cruel, MD  Admit date: 12/08/2018 Discharge date: 12/11/2018  Admitted From: Home Disposition: Home  Recommendations for Outpatient Follow-up:  1. Follow up with PCP in 1 week 2. ID clinic will reach out to patient for follow-up 3. Please obtain BMP/CBC in one week 4. Please follow up on the following pending results: Blood culture  Home Health: PT Equipment/Devices: 3 in 1  Discharge Condition: Stable CODE STATUS: Full code Diet recommendation: Heart healthy/low sodium   Brief/Interim Summary:  Admission HPI written by Dana Gravel, MD   HPI:   Dana Watkins  is a 63 y.o. female,  w hypertension, ckd stage3, cad, mild mitral regurgitation, cirrhosis, anemia, thrombocytopenia, vitamin D deficiency, recent C. Diff colitis, apparently presents with c/o difficulty walking due to lower ext edema  In ED,  T 99.2, P 91  R 21, Bp 141/69  Pox 92% on Ra Wt 59kg  CT brain IMPRESSION: 1. No acute intracranial abnormality. 2. Mild parenchymal atrophy and chronic microvascular angiopathy. 3. Chronic right maxillary sinusitis. High attenuation material and reactive thickening of the sinus walls suggesting chronic allergic fungal sinusitis.  CXR IMPRESSION: Mild cardiomegaly without focal airspace disease or overt pulmonary edema.  Na 139, K 2.9 Bun 14, creatinine 1.48 AG 7, Hco3 16 Ast 95, Alt 34, Alk hos 180, T. Bili 3.4 INR 1.7 ETOH <10 Ammonia 60 Urinalysis prot 30 UDS benzo positive  Wbc 8.1, Hgb 7.7, Plt 58    Hospital course:  Recurrent C. difficile colitis Patient's third episode, however, first recurrence was not fully treated secondary to interruption of regimen. Patient started on Vancomycin PO QID with quick improvement of diarrheal symptoms. Stools began to form and leukocytosis resolved. Patient without abdominal pain or fevers.  Discussed with infectious disease and recommendations for repeat vancomycin taper.  Generalized weakness/failure to thrive Likely due to above. Patient evaluated by physical therapy and initially was recommended for SNF. However, patient improved with continued  Management of C. Difficile. Recommendations on discharge for home health with 3 in 1 commode.  Hypokalemia Patient supplemented with potassium. In setting of diarrhea. Discharged on potassium. Will need repeat metabolic panel.  History of liver cirrhosis Ammonia 60-->31 (no lactulose given). No ascites. Counseled to not drink alcohol. Patient will need close PCP follow-up.  CKD stage III with metabolic acidosis Stable.  Anemia of chronic disease/thrombocytopenia Hemoglobin/PLT near baseline. In setting of cirrhosis. Recommend repeat CBC outpatient.   Discharge Diagnoses:  Principal Problem:   Weakness Active Problems:   Thrombocytopenia (HCC)   Elevated liver function tests   Anemia of chronic illness    Discharge Instructions  Discharge Instructions    Call MD for:  extreme fatigue   Complete by: As directed    Call MD for:  severe uncontrolled pain   Complete by: As directed    Call MD for:  temperature >100.4   Complete by: As directed    Increase activity slowly   Complete by: As directed      Allergies as of 12/11/2018      Reactions   Sulfa Antibiotics Nausea And Vomiting, Rash      Medication List    STOP taking these medications   Klor-Con M20 20 MEQ tablet Generic drug: potassium chloride SA     TAKE these medications   diclofenac sodium 1 % Gel Commonly known as: VOLTAREN Apply 2 g topically 4 (four) times daily.  folic acid 1 MG tablet Commonly known as: FOLVITE Take 1 tablet (1 mg total) by mouth daily.   MAGNESIUM PO Take 1 tablet by mouth daily.   multivitamin with minerals Tabs tablet Take 1 tablet by mouth daily.   ondansetron 4 MG tablet Commonly known as:  ZOFRAN Take 1 tablet (4 mg total) by mouth every 6 (six) hours as needed for nausea.   potassium chloride 20 MEQ packet Commonly known as: KLOR-CON Take 20 mEq by mouth 2 (two) times daily for 5 days.   saccharomyces boulardii 250 MG capsule Commonly known as: Florastor Take 1 capsule (250 mg total) by mouth 2 (two) times daily.   sodium bicarbonate 650 MG tablet Take 1 tablet (650 mg total) by mouth 2 (two) times daily.   thiamine 100 MG tablet Take 1 tablet (100 mg total) by mouth daily.   vancomycin 50 mg/mL  oral solution Commonly known as: VANCOCIN Take 2.5 mLs (125 mg total) by mouth 4 (four) times daily for 9 days, THEN 2.5 mLs (125 mg total) 3 (three) times daily for 7 days, THEN 2.5 mLs (125 mg total) 2 (two) times daily for 7 days, THEN 2.5 mLs (125 mg total) daily for 7 days, THEN 2.5 mLs (125 mg total) every other day for 8 days. Start taking on: December 11, 2018 What changed: See the new instructions.   Vitamin D3 50 MCG (2000 UT) Tabs Take 1 tablet by mouth daily.            Durable Medical Equipment  (From admission, onward)         Start     Ordered   12/11/18 1539  For home use only DME 3 n 1  Once     12/11/18 1540         Follow-up Information    Lawerance Cruel, MD. Schedule an appointment as soon as possible for a visit in 1 week(s).   Specialty: Family Medicine Why: Hospital follow-up Contact information: [place] [address] [phone]          Allergies  Allergen Reactions   Sulfa Antibiotics Nausea And Vomiting and Rash    Consultations:  Infectious disease (curbside)   Procedures/Studies: Ct Head Wo Contrast  Result Date: 12/09/2018 CLINICAL DATA:  Altered level of consciousness, weakness, unsteady gait EXAM: CT HEAD WITHOUT CONTRAST TECHNIQUE: Contiguous axial images were obtained from the base of the skull through the vertex without intravenous contrast. COMPARISON:  CT head 06/25/2018  FINDINGS: Brain: No evidence of acute infarction, hemorrhage, hydrocephalus, extra-axial collection or mass lesion/mass effect. Symmetric prominence of the ventricles, cisterns and sulci compatible with parenchymal volume loss. Patchy areas of white matter hypoattenuation are most compatible with chronic microvascular angiopathy. Vascular: Atherosclerotic calcification of the carotid siphons. No hyperdense vessel. Skull: No calvarial fracture or suspicious osseous lesion. No scalp swelling or hematoma. Sinuses/Orbits: Chronic subtotal opacification of the right maxillary sinus with high attenuation material. Mild sclerotic mural thickening of the right maxillary sinus walls. No abnormal stranding seen along the sinus perimeter. Remainder of the paranasal sinuses are predominantly clear aside from mild mural disease. Mastoid air cells are well aerated. Middle ear cavities are clear. Debris noted in the right external auditory canal. Included orbital structures are unremarkable. Other: None IMPRESSION: 1. No acute intracranial abnormality. 2. Mild parenchymal atrophy and chronic microvascular angiopathy. 3. Chronic right maxillary sinusitis. High attenuation material and reactive thickening of the sinus walls suggesting chronic allergic fungal sinusitis. Electronically  Signed   By: Lovena Le M.D.   On: 12/09/2018 02:27   Dg Chest Port 1 View  Result Date: 12/09/2018 CLINICAL DATA:  Fever and altered mental status EXAM: PORTABLE CHEST 1 VIEW COMPARISON:  10/30/2018 FINDINGS: Mild cardiomegaly. No focal airspace consolidation or overt pulmonary edema. No pleural effusion or pneumothorax. IMPRESSION: Mild cardiomegaly without focal airspace disease or overt pulmonary edema. Electronically Signed   By: Ulyses Jarred M.D.   On: 12/09/2018 00:14   US Abdomen Limited Ruq  Result Date: 12/09/2018 CLINICAL DATA:  Elevated LFTs EXAM: ULTRASOUND ABDOMEN LIMITED RIGHT UPPER QUADRANT COMPARISON:  10/31/2018 and  previous FINDINGS: Gallbladder: Single mobile 6 mm calculus. No wall thickening. No pericholecystic fluid. Sonographer describes no sonographic Murphy's sign. Common bile duct: Diameter: 6 mm Liver: Nodular contour. No mass. 1.5 cm left lobe simple cyst. Within normal limits in parenchymal echogenicity. Portal vein is patent on color Doppler imaging with normal direction of blood flow towards the liver. Other: Small volume perihepatic ascites. 4 cm upper pole right renal cyst, as described on previous MR 05/29/2013. IMPRESSION: 1. Cholelithiasis without other ultrasound evidence of cholecystitis or biliary obstruction. 2. Probable cirrhosis, with small volume perihepatic ascites Electronically Signed   By: Lucrezia Europe M.D.   On: 12/09/2018 13:45      Subjective: Diarrhea is more formed today. Feels much more like herself.  Discharge Exam: Vitals:   12/10/18 2200 12/11/18 1329  BP: 123/67 130/69  Pulse: 88 89  Resp: 17 18  Temp: 98.9 F (37.2 C) 97.7 F (36.5 C)  SpO2: 99% 100%   Vitals:   12/10/18 0606 12/10/18 1247 12/10/18 2200 12/11/18 1329  BP: 131/73 125/70 123/67 130/69  Pulse: 97 83 88 89  Resp:  _0 Temp: 99.7 F (37.6 C) 99 F (37.2 C) 98.9 F (37.2 C) 97.7 F (36.5 C)  TempSrc:  Oral    SpO2: 98% 100% 99% 100%  Weight:      Height:        General: Pt is alert, awake, not in acute distress Cardiovascular: RRR, S1/S2 +, no rubs, no gallops Respiratory: CTA bilaterally, no wheezing, no rhonchi Abdominal: Soft, NT, ND, bowel sounds + Extremities: no edema, no cyanosis    The results of significant diagnostics from this hospitalization (including imaging, microbiology, ancillary and laboratory) are listed below for reference.     Microbiology: Recent Results (from the past 240 hour(s))  Blood Culture (routine x 2)     Status: None (Preliminary result)   Collection Time: 12/08/18 12:30 AM   Specimen: BLOOD LEFT FOREARM  Result Value Ref Range Status    Specimen Description BLOOD LEFT FOREARM  Final   Special Requests   Final    BOTTLES DRAWN AEROBIC AND ANAEROBIC Blood Culture adequate volume   Culture   Final    NO GROWTH 2 DAYS Performed at [facility], [address] [place], [place] [address]    Report Status PENDING  Incomplete  Blood Culture (routine x 2)     Status: None (Preliminary result)   Collection Time: 12/08/18  1:36 AM   Specimen: BLOOD  Result Value Ref Range Status   Specimen Description BLOOD RIGHT ANTECUBITAL  Final   Special Requests   Final    BOTTLES DRAWN AEROBIC AND ANAEROBIC Blood Culture adequate volume   Culture   Final    NO GROWTH 2 DAYS Performed at [facility], [place] [address] [place],  [address]  Report Status PENDING  Incomplete  Urine culture     Status: None   Collection Time: 12/09/18 12:38 AM   Specimen: In/Out Cath Urine  Result Value Ref Range Status   Specimen Description IN/OUT CATH URINE  Final   Special Requests NONE  Final   Culture   Final    NO GROWTH Performed at [facility], [place] [address], [place], [place] [address]    Report Status 12/10/2018 FINAL  Final  SARS CORONAVIRUS 2 (TAT 6-24 HRS) Nasopharyngeal Nasopharyngeal Swab     Status: None   Collection Time: 12/09/18  2:46 AM   Specimen: Nasopharyngeal Swab  Result Value Ref Range Status   SARS Coronavirus 2 NEGATIVE NEGATIVE Final    Comment: (NOTE) SARS-CoV-2 target nucleic acids are NOT DETECTED. The SARS-CoV-2 RNA is generally detectable in upper and lower respiratory specimens during the acute phase of infection. Negative results do not preclude SARS-CoV-2 infection, do not rule out co-infections with other pathogens, and should not be used as the sole basis for treatment or other patient management decisions. Negative results must be combined with clinical observations, patient history, and epidemiological information. The expected result is Negative. Fact Sheet for  Patients: SugarRoll.be Fact Sheet for Healthcare Providers: https://www.woods-mathews.com/ This test is not yet approved or cleared by the [place] FDA and  has been authorized for detection and/or diagnosis of SARS-CoV-2 by FDA under an Emergency Use Authorization (EUA). This EUA will remain  in effect (meaning this test can be used) for the duration of the COVID-19 declaration under Section 56 4(b)(1) of the Act, 21 U.S.C. section 360bbb-3(b)(1), unless the authorization is terminated or revoked sooner. Performed at [facility], [place] [address], [place], [place] [address]      Labs: BNP (last 3 results) Recent Labs    10/16/18 1042  BNP 759.1*   Basic Metabolic Panel: Recent Labs  Lab 12/08/18 2346 12/09/18 0826 12/09/18 1518 12/10/18 0808 12/11/18 0326  NA 139  --  140 136 139  K 2.9*  --  2.7* 2.7* 3.0*  CL 116*  --  117* 113* 115*  CO2 16*  --  14* 16* 16*  GLUCOSE 99  --  106* 118* 105*  BUN 14  --  _0 CREATININE 1.48*  --  1.50* 1.49* 1.42*  CALCIUM 9.5  --  9.0 8.7* 9.1  MG  --  1.9  --   --   --    Liver Function Tests: Recent Labs  Lab 12/08/18 2346 12/09/18 1518 12/10/18 0808 12/11/18 0326  AST 95* 77* 68* 59*  ALT 34 _1 ALKPHOS 180* 137* 132* 143*  BILITOT 3.4* 4.4* 5.0* 3.2*  PROT 5.5* 5.2* 5.0* 5.1*  ALBUMIN 2.3* 2.1* 2.1* 2.1*   No results for input(s): LIPASE, AMYLASE in the last 168 hours. Recent Labs  Lab 12/09/18 0016 12/09/18 1519 12/10/18 0808  AMMONIA 60* 63* 31   CBC: Recent Labs  Lab 12/08/18 2346 12/09/18 1518 12/10/18 0808 12/11/18 0326  WBC 8.1 11.5* 14.0* 8.4  NEUTROABS 6.9 9.2* 11.0* 5.9  HGB 7.7* 7.3* 7.4* 7.2*  HCT 24.3* 23.4* 22.2* 22.3*  MCV 96.8 97.5 94.5 97.4  PLT 58* 58* 64* 66*   Cardiac Enzymes: Recent Labs  Lab 12/08/18 2346  CKTOTAL 51   BNP: Invalid input(s): POCBNP CBG: No results for input(s): GLUCAP in the last 168  hours. D-Dimer No results for input(s): DDIMER in the last 72 hours.  Hgb A1c No results for input(s): HGBA1C in the last 72 hours. Lipid Profile No results for input(s): CHOL, HDL, LDLCALC, TRIG, CHOLHDL, LDLDIRECT in the last 72 hours. Thyroid function studies Recent Labs    12/09/18 1518  TSH 0.752   Anemia work up Recent Labs    12/09/18 1518  VITAMINB12 1,207*  FOLATE 31.0  FERRITIN 34  TIBC 266  IRON 209*   Urinalysis    Component Value Date/Time   COLORURINE YELLOW 12/09/2018 [place] 12/09/2018 0053   LABSPEC 1.014 12/09/2018 0053   PHURINE 6.0 12/09/2018 0053   GLUCOSEU 150 (A) 12/09/2018 0053   HGBUR SMALL (A) 12/09/2018 0053   BILIRUBINUR NEGATIVE 12/09/2018 0053   KETONESUR NEGATIVE 12/09/2018 0053   PROTEINUR 30 (A) 12/09/2018 0053   NITRITE NEGATIVE 12/09/2018 0053   LEUKOCYTESUR NEGATIVE 12/09/2018 0053   Sepsis Labs Invalid input(s): PROCALCITONIN,  WBC,  LACTICIDVEN Microbiology Recent Results (from the past 240 hour(s))  Blood Culture (routine x 2)     Status: None (Preliminary result)   Collection Time: 12/08/18 12:30 AM   Specimen: BLOOD LEFT FOREARM  Result Value Ref Range Status   Specimen Description BLOOD LEFT FOREARM  Final   Special Requests   Final    BOTTLES DRAWN AEROBIC AND ANAEROBIC Blood Culture adequate volume   Culture   Final    NO GROWTH 2 DAYS Performed at [facility], [place] [address], [place], [place] [address]    Report Status PENDING  Incomplete  Blood Culture (routine x 2)     Status: None (Preliminary result)   Collection Time: 12/08/18  1:36 AM   Specimen: BLOOD  Result Value Ref Range Status   Specimen Description BLOOD RIGHT ANTECUBITAL  Final   Special Requests   Final    BOTTLES DRAWN AEROBIC AND ANAEROBIC Blood Culture adequate volume   Culture   Final    NO GROWTH 2 DAYS Performed at [facility], [place] [address] [place], [place] [address]    Report Status PENDING  Incomplete   Urine culture     Status: None   Collection Time: 12/09/18 12:38 AM   Specimen: In/Out Cath Urine  Result Value Ref Range Status   Specimen Description IN/OUT CATH URINE  Final   Special Requests NONE  Final   Culture   Final    NO GROWTH Performed at [facility], [place] [address], [place], [place] [address]    Report Status 12/10/2018 FINAL  Final  SARS CORONAVIRUS 2 (TAT 6-24 HRS) Nasopharyngeal Nasopharyngeal Swab     Status: None   Collection Time: 12/09/18  2:46 AM   Specimen: Nasopharyngeal Swab  Result Value Ref Range Status   SARS Coronavirus 2 NEGATIVE NEGATIVE Final    Comment: (NOTE) SARS-CoV-2 target nucleic acids are NOT DETECTED. The SARS-CoV-2 RNA is generally detectable in upper and lower respiratory specimens during the acute phase of infection. Negative results do not preclude SARS-CoV-2 infection, do not rule out co-infections with other pathogens, and should not be used as the sole basis for treatment or other patient management decisions. Negative results must be combined with clinical observations, patient history, and epidemiological information. The expected result is Negative. Fact Sheet for Patients: SugarRoll.be Fact Sheet for Healthcare Providers: https://www.woods-mathews.com/ This test is not yet approved or cleared by the [place] FDA and  has been authorized for detection and/or diagnosis of SARS-CoV-2 by FDA under an Emergency Use Authorization (EUA). This EUA  will remain  in effect (meaning this test can be used) for the duration of the COVID-19 declaration under Section 56 4(b)(1) of the Act, 21 U.S.C. section 360bbb-3(b)(1), unless the authorization is terminated or revoked sooner. Performed at [facility], [place] [address] [place],  [address]      Time coordinating discharge: 35 minutes  SIGNED:   Cordelia Poche, MD [facility] 12/11/2018, 4:06 PM

## 2018-12-11 NOTE — Discharge Instructions (Signed)
Clostridioides Difficile Infection Clostridioides difficile (C. difficile or C. diff) infection is a condition that occurs when an overgrowth of C. diff bacteria causes irritation and swelling (inflammation) of the colon (colitis). It is typically associated with recent use of antibiotic medication. This infection can be passed from person to person (is contagious). You may also be exposed to the bacteria from the environment, such as from food or water, or from touching surfaces that have the bacteria on them. What are the causes? Certain bacteria normally live in the colon and help to digest food. This infection develops when the balance of bacteria in the colon is changed and the C. diff bacteria grow out of control. This is often caused by taking antibiotics. What increases the risk? You are more likely to develop this condition if you:  Need to take antibiotics for a long period of time.  Take certain antibiotics that kill a wide range of bacteria.  Are in the hospital.  Are older than 63 years of age.  Live in a place where there is a lot of contact with others, such as a nursing home.  Have had a C. diff infection before.  Have a weak defense (immune) system.  Take a medicine called a proton pump inhibitor over a long period of time.  Have serious underlying conditions, such as colon cancer.  Have had a gastrointestinal (GI) tract procedure or surgery. What are the signs or symptoms? Symptoms of this condition include:  Diarrhea. This may be bloody, watery, or yellow or green in color.  Fever.  Fatigue.  Loss of appetite.  Nausea.  Swelling, pain, or tenderness in the abdomen. How is this diagnosed? This condition is diagnosed with medical history and physical exam. You may also have other tests, including:  A test that checks for C. diff in your stool.  Blood tests.  Imaging tests, such as a CT scan of your abdomen. In rare cases, a sigmoidoscopy or  colonoscopy may be used to look directly into your colon. These procedures involve passing an instrument through your rectum to look at the inside of your colon. How is this treated? Treatment for this condition includes:  Stopping the antibiotics that you were on when the C. diff infection began. Do this only as told by your health care provider.  Taking certain antibiotics to stop C. diff from growing.  Taking donor stool from a healthy person and placing it into the colon (fecal transplant) through a colonoscope or nasogastric tube. This may be done if you have a recurrent infection.  Having surgery to remove the infected part of the colon. This is rare. Follow these instructions at home: Eating and drinking   Eat bland, easy-to-digest foods in small amounts as you are able. These foods include bananas, applesauce, rice, lean meats, toast, and crackers.  Follow specific instructions on how to get enough water into your body (rehydrate). This may include: ? Drinking clear fluids, such as water, ice chips, diluted fruit juice, and low-calorie sports drinks. ? Taking an oral rehydration solution (ORS). This is a drink that is sold at pharmacies and retail stores.  Avoid milk, caffeine, and alcohol.  Drink enough fluid to keep your urine pale yellow. General instructions  Take over-the-counter and prescription medicines only as told by your health care provider.  Take your antibiotic medicine as told by your health care provider. Do not stop taking the antibiotic, even if you start to feel better. You may stop taking it only  if your health care provider tells you to stop.  Wash your hands with soap and water.  Do not use medicines to help with diarrhea. You may use these medicines only if your health care provider tells you to.  Keep all follow-up visits as told by your health care provider. This is important. How is this prevented? Hand hygiene   Use soap and water to wash your  hands thoroughly before you prepare food and after you use the bathroom. Make sure that people who live with you also wash their hands often.  If you are being treated at a hospital or clinic, make sure that all health care providers wash their hands with soap and water before touching you.  If you are staying in the hospital, make sure that all visitors wash their hands with soap and water before touching you. Contact precautions  If you develop diarrhea while in the hospital or a long-term care facility, let your health care team know right away.  When visiting someone in the hospital or a long-term care facility, follow guidelines for wearing a gown, gloves, or other protective equipment.  If possible, avoid contact with people who have diarrhea. Clean environment  Clean surfaces with a product that contains chlorine bleach.  If you are in the hospital, make sure that staff members clean the surfaces in your room daily. Let a staff person know right away if body fluids have splashed or spilled in your room.  Use chlorine bleach and high heat when cleaning soiled clothing or linens. Contact a health care provider if:  Your symptoms do not get better with treatment.  Your symptoms get worse, even with treatment.  Your symptoms go away and then come back.  You have a fever.  You have new symptoms. Get help right away if you:  Have more pain or tenderness in your abdomen.  Have stool that is mostly bloody, or your stool looks dark black and tarry.  Cannot eat or drink without vomiting.  Have signs or symptoms of dehydration, such as: ? Dark urine, very little urine, or no urine. ? Cracked lips. ? Not making tears when you cry. ? Dry mouth. ? Sunken eyes. ? Sleepiness. ? Weakness. ? Dizziness. Summary  Clostridioides difficile (C. difficile or C. diff) infection is a condition that occurs when an overgrowth of C. diff bacteria causes irritation and swelling  (inflammation) of the colon; typically after taking antibiotics.  Symptoms of C. diff infection include diarrhea, fever, fatigue, loss of appetite, nausea, and swelling, pain, or tenderness in the abdomen.  C. diff infection is treated by stopping the antibiotics you were on when the C. diff infection began, and then taking certain antibiotics to keep C. diff from growing. In some cases, fecal transplant or surgery is performed.  Hand washing with soap and water, contact precautions, and a clean environment can help prevent or limit the spread of C. diff infection. This information is not intended to replace advice given to you by your health care provider. Make sure you discuss any questions you have with your health care provider. Document Released: 10/07/2004 Document Revised: 09/01/2017 Document Reviewed: 09/01/2017 Elsevier Patient Education  2020 [employer].

## 2018-12-13 MED FILL — VANCOMYCIN 50MG/ML ORAL SOL: 14 days supply | Qty: 140 | Fill #2

## 2018-12-14 LAB — CULTURE, BLOOD (ROUTINE X 2)
Culture: NO GROWTH
Culture: NO GROWTH
Special Requests: ADEQUATE
Special Requests: ADEQUATE

## 2018-12-19 MED FILL — VANCOMYCIN 50MG/ML ORAL SOL: 14 days supply | Qty: 140 | Fill #3

## 2018-12-19 MED FILL — SPIRONOLACTONE 25 MG TABS: 25 | 30 days supply | Qty: 60 | Fill #0

## 2018-12-20 ENCOUNTER — Telehealth: Payer: Self-pay | Admitting: Cardiology

## 2018-12-20 NOTE — Telephone Encounter (Signed)
Yes, you can go over the care team to arrange her urgent referral appt to see Cards.

## 2018-12-20 NOTE — Telephone Encounter (Signed)
Patient had an urgant referral sent from [place] at [facility] for Edema and BNP in hospital. I do not see anything available with Dr. Meda Coffee or her PA's until January. I have not contacted patient yet, just need to know if I can go off the care team for scheduling any further. She has an appt with Dr. Meda Coffee on 02/15/19.
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
## 2018-12-26 ENCOUNTER — Ambulatory Visit (INDEPENDENT_AMBULATORY_CARE_PROVIDER_SITE_OTHER): Payer: Medicare Other | Admitting: Nurse Practitioner

## 2018-12-26 ENCOUNTER — Encounter: Payer: Self-pay | Admitting: Nurse Practitioner

## 2018-12-26 ENCOUNTER — Other Ambulatory Visit: Payer: Self-pay

## 2018-12-26 VITALS — BP 138/70 | HR 78 | Ht 68.0 in | Wt 139.8 lb

## 2018-12-26 DIAGNOSIS — E876 Hypokalemia: Secondary | ICD-10-CM

## 2018-12-26 DIAGNOSIS — I10 Essential (primary) hypertension: Secondary | ICD-10-CM

## 2018-12-26 DIAGNOSIS — K7469 Other cirrhosis of liver: Secondary | ICD-10-CM

## 2018-12-26 DIAGNOSIS — I2510 Atherosclerotic heart disease of native coronary artery without angina pectoris: Secondary | ICD-10-CM | POA: Diagnosis not present

## 2018-12-26 DIAGNOSIS — I340 Nonrheumatic mitral (valve) insufficiency: Secondary | ICD-10-CM

## 2018-12-26 NOTE — Patient Instructions (Addendum)
After Visit Summary:  We will be checking the following labs today - NONE   Medication Instructions:    Continue with your current medicines.    If you need a refill on your cardiac medications before your next appointment, please call your pharmacy.     Testing/Procedures To Be Arranged:  N/A  Follow-Up:   See Dr. Meda Coffee as planned in February.     At [facility], you and your health needs are our priority.  As part of our continuing mission to provide you with exceptional heart care, we have created designated Provider Care Teams.  These Care Teams include your primary Cardiologist (physician) and Advanced Practice Providers (APPs -  Physician Assistants and Nurse Practitioners) who all work together to provide you with the care you need, when you need it.  Special Instructions:  . Stay safe, stay home, wash your hands for at least 20 seconds and wear a mask when out in public.  . It was good to talk with you today.  . Talk with Dr. Harrington Challenger about appointments with GI and hematology   Call the [place] office at [phone] if you have any questions, problems or concerns.

## 2018-12-27 ENCOUNTER — Telehealth: Payer: Self-pay | Admitting: Hematology & Oncology

## 2018-12-27 NOTE — Telephone Encounter (Signed)
lmom to inform patient of 01/19/19 appt at 1030 am per referral on 12/16

## 2018-12-28 ENCOUNTER — Other Ambulatory Visit: Payer: Self-pay | Admitting: Family Medicine

## 2018-12-28 DIAGNOSIS — Z1231 Encounter for screening mammogram for malignant neoplasm of breast: Secondary | ICD-10-CM

## 2019-01-04 MED FILL — VANCOMYCIN 50MG/ML ORAL SOL: 10 days supply | Qty: 100 | Fill #4

## 2019-01-19 ENCOUNTER — Ambulatory Visit: Payer: Medicare Other | Admitting: Hematology & Oncology

## 2019-01-19 ENCOUNTER — Other Ambulatory Visit: Payer: Medicare Other

## 2019-02-14 ENCOUNTER — Ambulatory Visit: Payer: Medicare Other

## 2019-02-15 ENCOUNTER — Other Ambulatory Visit: Payer: Self-pay

## 2019-02-15 ENCOUNTER — Encounter: Payer: Self-pay | Admitting: Cardiology

## 2019-02-15 ENCOUNTER — Telehealth: Payer: Self-pay | Admitting: Cardiology

## 2019-02-15 ENCOUNTER — Ambulatory Visit (INDEPENDENT_AMBULATORY_CARE_PROVIDER_SITE_OTHER): Payer: Medicare (Managed Care) | Admitting: Cardiology

## 2019-02-15 VITALS — BP 136/68 | HR 75 | Ht 68.0 in | Wt 142.8 lb

## 2019-02-15 DIAGNOSIS — R7989 Other specified abnormal findings of blood chemistry: Secondary | ICD-10-CM

## 2019-02-15 DIAGNOSIS — I340 Nonrheumatic mitral (valve) insufficiency: Secondary | ICD-10-CM | POA: Diagnosis not present

## 2019-02-15 DIAGNOSIS — I2510 Atherosclerotic heart disease of native coronary artery without angina pectoris: Secondary | ICD-10-CM | POA: Diagnosis not present

## 2019-02-15 DIAGNOSIS — E860 Dehydration: Secondary | ICD-10-CM

## 2019-02-15 DIAGNOSIS — K7469 Other cirrhosis of liver: Secondary | ICD-10-CM | POA: Diagnosis not present

## 2019-02-15 DIAGNOSIS — N179 Acute kidney failure, unspecified: Secondary | ICD-10-CM

## 2019-02-15 DIAGNOSIS — E876 Hypokalemia: Secondary | ICD-10-CM | POA: Diagnosis not present

## 2019-02-15 DIAGNOSIS — D696 Thrombocytopenia, unspecified: Secondary | ICD-10-CM

## 2019-02-15 DIAGNOSIS — I10 Essential (primary) hypertension: Secondary | ICD-10-CM
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
## 2019-02-15 NOTE — Telephone Encounter (Signed)
[name], MD    Please advise her to take 40 mEq KCL today, followed by 20 mEq daily, increase hydration and refer to nephrology unless she is followed already.

## 2019-02-15 NOTE — Telephone Encounter (Signed)
[facility] was calling with critical lab results for the patient

## 2019-02-15 NOTE — Telephone Encounter (Signed)
Spoke with Dana Watkins at [facility] and she is calling to report pts critical lab value of 2.6 potassium level. Made Dr. Meda Coffee aware of this and she will review and advise.  Will follow-up with the Dana Watkins accordingly thereafter.

## 2019-02-15 NOTE — Telephone Encounter (Signed)
Left the pt a detailed message to call the office back to receive lab results and recommendations per Dr. Meda Coffee. Pt did have a critical K level of 2.6, and Dr. Meda Coffee did advise on orders for this. Advised the pt to call back as soon as she can.

## 2019-02-15 NOTE — Patient Instructions (Addendum)
Medication Instructions:  Your provider recommends that you continue on your current medications as directed. Please refer to the Current Medication list given to you today.   *If you need a refill on your cardiac medications before your next appointment, please call your pharmacy*  Lab Work: TODAY: CMET, CBC, TSH If you have labs (blood work) drawn today and your tests are completely normal, you will receive your results only by: Marland Kitchen MyChart Message (if you have MyChart) OR . A paper copy in the mail If you have any lab test that is abnormal or we need to change your treatment, we will call you to review the results.  Follow-Up: At [facility], you and your health needs are our priority.  As part of our continuing mission to provide you with exceptional heart care, we have created designated Provider Care Teams.  These Care Teams include your primary Cardiologist (physician) and Advanced Practice Providers (APPs -  Physician Assistants and Nurse Practitioners) who all work together to provide you with the care you need, when you need it. Your next appointment:   6 month(s) The format for your next appointment:   In Person Provider:   You may see Dr. Ena Dawley or one of the following Advanced Practice Providers on your designated Care Team:    Melina Copa, PA-C  Ermalinda Barrios, PA-C

## 2019-02-16 LAB — CBC WITH DIFFERENTIAL/PLATELET
Basophils Absolute: 0.1 10*3/uL (ref 0.0–0.2)
Basos: 1 %
EOS (ABSOLUTE): 0.2 10*3/uL (ref 0.0–0.4)
Eos: 4 %
Hematocrit: 23.2 % — ABNORMAL LOW (ref 34.0–46.6)
Hemoglobin: 7.9 g/dL — ABNORMAL LOW (ref 11.1–15.9)
Immature Grans (Abs): 0 10*3/uL (ref 0.0–0.1)
Immature Granulocytes: 0 %
Lymphocytes Absolute: 1.2 10*3/uL (ref 0.7–3.1)
Lymphs: 22 %
MCH: 28.4 pg (ref 26.6–33.0)
MCHC: 34.1 g/dL (ref 31.5–35.7)
MCV: 84 fL (ref 79–97)
Monocytes Absolute: 0.7 10*3/uL (ref 0.1–0.9)
Monocytes: 13 %
Neutrophils Absolute: 3.2 10*3/uL (ref 1.4–7.0)
Neutrophils: 60 %
Platelets: 82 10*3/uL — CL (ref 150–450)
RBC: 2.78 x10E6/uL — ABNORMAL LOW (ref 3.77–5.28)
RDW: 17 % — ABNORMAL HIGH (ref 11.7–15.4)
WBC: 5.3 10*3/uL (ref 3.4–10.8)

## 2019-02-16 LAB — COMPREHENSIVE METABOLIC PANEL
ALT: 28 IU/L (ref 0–32)
AST: 63 IU/L — ABNORMAL HIGH (ref 0–40)
Albumin/Globulin Ratio: 1.1 — ABNORMAL LOW (ref 1.2–2.2)
Albumin: 3.1 g/dL — ABNORMAL LOW (ref 3.8–4.8)
Alkaline Phosphatase: 248 IU/L — ABNORMAL HIGH (ref 39–117)
BUN/Creatinine Ratio: 6 — ABNORMAL LOW (ref 12–28)
BUN: 11 mg/dL (ref 8–27)
Bilirubin Total: 2.9 mg/dL — ABNORMAL HIGH (ref 0.0–1.2)
CO2: 16 mmol/L — ABNORMAL LOW (ref 20–29)
Calcium: 9.5 mg/dL (ref 8.7–10.3)
Chloride: 110 mmol/L — ABNORMAL HIGH (ref 96–106)
Creatinine, Ser: 1.73 mg/dL — ABNORMAL HIGH (ref 0.57–1.00)
GFR calc Af Amer: 36 mL/min/{1.73_m2} — ABNORMAL LOW (ref >59)
GFR calc non Af Amer: 31 mL/min/{1.73_m2} — ABNORMAL LOW (ref >59)
Globulin, Total: 2.9 g/dL (ref 1.5–4.5)
Glucose: 97 mg/dL (ref 65–99)
Potassium: 2.6 mmol/L — CL (ref 3.5–5.2)
Sodium: 139 mmol/L (ref 134–144)
Total Protein: 6 g/dL (ref 6.0–8.5)

## 2019-02-16 LAB — TSH: TSH: 1.14 u[IU]/mL (ref 0.450–4.500)

## 2019-02-16 MED ORDER — POTASSIUM CHLORIDE CRYS ER 20 MEQ PO TBCR: EXTENDED_RELEASE_TABLET | ORAL | 0 refills | Status: DC

## 2019-02-16 NOTE — Telephone Encounter (Signed)
Left message for the pt to call back for results. 2nd attempt at trying to make contact with the pt to endorse abnormal lab results and recommendations per Dr. Meda Coffee.

## 2019-02-16 NOTE — Telephone Encounter (Signed)
Follow up  ° ° °Pt is returning call  ° ° °Please call back  °

## 2019-02-16 NOTE — Telephone Encounter (Signed)
Notified the pt that per Dr. Meda Coffee, her labs indicate that she needs to increase her potassium to 40 mEq po today, followed by 20 mEq po daily thereafter, increase her hydration, and refer her to nephrology, if she isn't already seeing one.  Confirmed the pharmacy of choice with the pt.  Advised her to increase her water intake.  Pt states she does not have a nephrologist, so I will refer her to [facility]. Informed the pt that I will place the referral for the Nephrologist in the system and our [facility] schedulers will call her back and arrange this appt.  Pt verbalized understanding and agrees with this plan.

## 2019-02-20 ENCOUNTER — Inpatient Hospital Stay (HOSPITAL_COMMUNITY)
Admission: EM | Admit: 2019-02-20 | Discharge: 2019-02-22 | DRG: 864 | Disposition: A | Discharge status: Home / Self Care | Payer: Medicare (Managed Care) | Attending: Internal Medicine | Admitting: Internal Medicine

## 2019-02-20 ENCOUNTER — Encounter (HOSPITAL_COMMUNITY): Payer: Self-pay | Admitting: Emergency Medicine

## 2019-02-20 ENCOUNTER — Emergency Department (HOSPITAL_COMMUNITY): Payer: Medicare (Managed Care)

## 2019-02-20 ENCOUNTER — Other Ambulatory Visit: Payer: Self-pay

## 2019-02-20 DIAGNOSIS — E876 Hypokalemia: Secondary | ICD-10-CM

## 2019-02-20 DIAGNOSIS — R509 Fever, unspecified: Secondary | ICD-10-CM | POA: Diagnosis present

## 2019-02-20 DIAGNOSIS — Z20822 Contact with and (suspected) exposure to covid-19: Secondary | ICD-10-CM | POA: Diagnosis present

## 2019-02-20 DIAGNOSIS — K7460 Unspecified cirrhosis of liver: Secondary | ICD-10-CM | POA: Diagnosis present

## 2019-02-20 DIAGNOSIS — N1832 Chronic kidney disease, stage 3b: Secondary | ICD-10-CM | POA: Diagnosis present

## 2019-02-20 DIAGNOSIS — R112 Nausea with vomiting, unspecified: Secondary | ICD-10-CM

## 2019-02-20 DIAGNOSIS — I340 Nonrheumatic mitral (valve) insufficiency: Secondary | ICD-10-CM | POA: Diagnosis present

## 2019-02-20 DIAGNOSIS — R05 Cough: Secondary | ICD-10-CM | POA: Diagnosis present

## 2019-02-20 DIAGNOSIS — Z882 Allergy status to sulfonamides status: Secondary | ICD-10-CM

## 2019-02-20 DIAGNOSIS — I10 Essential (primary) hypertension: Secondary | ICD-10-CM | POA: Diagnosis present

## 2019-02-20 DIAGNOSIS — I129 Hypertensive chronic kidney disease with stage 1 through stage 4 chronic kidney disease, or unspecified chronic kidney disease: Secondary | ICD-10-CM | POA: Diagnosis present

## 2019-02-20 DIAGNOSIS — D649 Anemia, unspecified: Secondary | ICD-10-CM | POA: Diagnosis present

## 2019-02-20 DIAGNOSIS — E860 Dehydration: Secondary | ICD-10-CM | POA: Diagnosis not present

## 2019-02-20 DIAGNOSIS — R350 Frequency of micturition: Secondary | ICD-10-CM | POA: Diagnosis present

## 2019-02-20 DIAGNOSIS — I2510 Atherosclerotic heart disease of native coronary artery without angina pectoris: Secondary | ICD-10-CM | POA: Diagnosis present

## 2019-02-20 DIAGNOSIS — Z862 Personal history of diseases of the blood and blood-forming organs and certain disorders involving the immune mechanism: Secondary | ICD-10-CM

## 2019-02-20 DIAGNOSIS — Z87891 Personal history of nicotine dependence: Secondary | ICD-10-CM

## 2019-02-20 DIAGNOSIS — E559 Vitamin D deficiency, unspecified: Secondary | ICD-10-CM | POA: Diagnosis present

## 2019-02-20 DIAGNOSIS — Z8249 Family history of ischemic heart disease and other diseases of the circulatory system: Secondary | ICD-10-CM

## 2019-02-20 DIAGNOSIS — R81 Glycosuria: Secondary | ICD-10-CM | POA: Diagnosis present

## 2019-02-20 DIAGNOSIS — R823 Hemoglobinuria: Secondary | ICD-10-CM | POA: Diagnosis present

## 2019-02-20 DIAGNOSIS — G43909 Migraine, unspecified, not intractable, without status migrainosus: Secondary | ICD-10-CM | POA: Diagnosis present

## 2019-02-20 LAB — COMPREHENSIVE METABOLIC PANEL
ALT: 26 U/L (ref 0–44)
AST: 46 U/L — ABNORMAL HIGH (ref 15–41)
Albumin: 2.4 g/dL — ABNORMAL LOW (ref 3.5–5.0)
Alkaline Phosphatase: 148 U/L — ABNORMAL HIGH (ref 38–126)
Anion gap: 9 (ref 5–15)
BUN: 11 mg/dL (ref 8–23)
CO2: 18 mmol/L — ABNORMAL LOW (ref 22–32)
Calcium: 8.4 mg/dL — ABNORMAL LOW (ref 8.9–10.3)
Chloride: 110 mmol/L (ref 98–111)
Creatinine, Ser: 1.39 mg/dL — ABNORMAL HIGH (ref 0.44–1.00)
GFR calc Af Amer: 47 mL/min — ABNORMAL LOW (ref >60)
GFR calc non Af Amer: 40 mL/min — ABNORMAL LOW (ref >60)
Glucose, Bld: 87 mg/dL (ref 70–99)
Potassium: 2.2 mmol/L — CL (ref 3.5–5.1)
Sodium: 137 mmol/L (ref 135–145)
Total Bilirubin: 4.1 mg/dL — ABNORMAL HIGH (ref 0.3–1.2)
Total Protein: 5.1 g/dL — ABNORMAL LOW (ref 6.5–8.1)

## 2019-02-20 LAB — URINALYSIS, ROUTINE W REFLEX MICROSCOPIC
Bilirubin Urine: NEGATIVE
Glucose, UA: 150 mg/dL — AB
Ketones, ur: NEGATIVE mg/dL
Nitrite: NEGATIVE
Protein, ur: NEGATIVE mg/dL
Specific Gravity, Urine: 1.005 (ref 1.005–1.030)
pH: 7 (ref 5.0–8.0)

## 2019-02-20 LAB — CBC WITH DIFFERENTIAL/PLATELET
Abs Immature Granulocytes: 0.03 10*3/uL (ref 0.00–0.07)
Basophils Absolute: 0 10*3/uL (ref 0.0–0.1)
Basophils Relative: 0 %
Eosinophils Absolute: 0.1 10*3/uL (ref 0.0–0.5)
Eosinophils Relative: 1 %
HCT: 25.3 % — ABNORMAL LOW (ref 36.0–46.0)
Hemoglobin: 8 g/dL — ABNORMAL LOW (ref 12.0–15.0)
Immature Granulocytes: 0 %
Lymphocytes Relative: 12 %
Lymphs Abs: 1 10*3/uL (ref 0.7–4.0)
MCH: 29 pg (ref 26.0–34.0)
MCHC: 31.6 g/dL (ref 30.0–36.0)
MCV: 91.7 fL (ref 80.0–100.0)
Monocytes Absolute: 0.9 10*3/uL (ref 0.1–1.0)
Monocytes Relative: 10 %
Neutro Abs: 6.6 10*3/uL (ref 1.7–7.7)
Neutrophils Relative %: 77 %
Platelets: 80 10*3/uL — ABNORMAL LOW (ref 150–400)
RBC: 2.76 MIL/uL — ABNORMAL LOW (ref 3.87–5.11)
RDW: 19.4 % — ABNORMAL HIGH (ref 11.5–15.5)
WBC: 8.6 10*3/uL (ref 4.0–10.5)
nRBC: 0 % (ref 0.0–0.2)

## 2019-02-20 LAB — RESPIRATORY PANEL BY RT PCR (FLU A&B, COVID)
Influenza A by PCR: NEGATIVE
Influenza B by PCR: NEGATIVE
SARS Coronavirus 2 by RT PCR: NEGATIVE

## 2019-02-20 LAB — MAGNESIUM: Magnesium: 1.8 mg/dL (ref 1.7–2.4)

## 2019-02-20 LAB — LIPASE, BLOOD: Lipase: 51 U/L (ref 11–51)

## 2019-02-20 LAB — LACTIC ACID, PLASMA: Lactic Acid, Venous: 2.6 mmol/L (ref 0.5–1.9)

## 2019-02-20 LAB — PHOSPHORUS: Phosphorus: 1.2 mg/dL — ABNORMAL LOW (ref 2.5–4.6)

## 2019-02-20 MED ORDER — BUTALBITAL-APAP-CAFFEINE 50-325-40 MG PO CAPS: 1.0000 | ORAL_CAPSULE | Freq: Four times a day (QID) | ORAL | Status: DC | PRN

## 2019-02-20 MED ORDER — THIAMINE HCL 100 MG PO TABS
100.0000 mg | ORAL_TABLET | Freq: Every day | ORAL | Status: DC
  Administered 2019-02-20 – 2019-02-22 (×3): 100 mg via ORAL
  Filled 2019-02-20 (×3): qty 1

## 2019-02-20 MED ORDER — POTASSIUM CHLORIDE 10 MEQ/100ML IV SOLN
10.0000 meq | INTRAVENOUS | Status: AC
  Administered 2019-02-20 (×5): 10 meq via INTRAVENOUS
  Filled 2019-02-20 (×5): qty 100

## 2019-02-20 MED ORDER — MAGNESIUM SULFATE 2 GM/50ML IV SOLN
2.0000 g | Freq: Once | INTRAVENOUS | Status: AC
  Administered 2019-02-20: 2 g via INTRAVENOUS
  Filled 2019-02-20: qty 50

## 2019-02-20 MED ORDER — POTASSIUM CHLORIDE IN NACL 40-0.9 MEQ/L-% IV SOLN
INTRAVENOUS | Status: DC
  Administered 2019-02-20 – 2019-02-22 (×5): 125 mL/h via INTRAVENOUS
  Filled 2019-02-20 (×8): qty 1000

## 2019-02-20 MED ORDER — LACTATED RINGERS IV BOLUS
1000.0000 mL | Freq: Once | INTRAVENOUS | Status: AC
  Administered 2019-02-20: 1000 mL via INTRAVENOUS

## 2019-02-20 MED ORDER — METOCLOPRAMIDE HCL 5 MG/ML IJ SOLN
10.0000 mg | Freq: Once | INTRAMUSCULAR | Status: AC
  Administered 2019-02-20: 10 mg via INTRAVENOUS
  Filled 2019-02-20: qty 2

## 2019-02-20 MED ORDER — BUTALBITAL-APAP-CAFFEINE 50-325-40 MG PO TABS: 1.0000 | ORAL_TABLET | Freq: Four times a day (QID) | ORAL | Status: DC | PRN

## 2019-02-20 MED ORDER — PROCHLORPERAZINE EDISYLATE 10 MG/2ML IJ SOLN
5.0000 mg | INTRAMUSCULAR | Status: DC | PRN
  Administered 2019-02-21: 5 mg via INTRAVENOUS
  Filled 2019-02-20: qty 2

## 2019-02-20 MED ORDER — POTASSIUM PHOSPHATES 15 MMOLE/5ML IV SOLN
30.0000 mmol | Freq: Once | INTRAVENOUS | Status: AC
  Administered 2019-02-20: 30 mmol via INTRAVENOUS
  Filled 2019-02-20: qty 10

## 2019-02-20 NOTE — ED Provider Notes (Signed)
[facility] DEPT Provider Note   CSN: [id number] Arrival date & time: 02/20/19  4270     History Chief Complaint  Patient presents with  . Nausea  . Emesis    Dana Watkins is a 64 y.o. female.   Pt is a 64 y/o female with hx of HTN, chronic thrombocytopenia (ITP), migraines, cardiomegaly, probable CKD III, hypokalemia, mitral regurgitation, CAD (cath 04/2017 - med rx), cirrhosis and prior Cdiff colitis who is presenting today with 6 days of ongoing nausea vomiting and generalized weakness.  She has had occasional cough but denies any chest pain, shortness of breath or sputum production.  No fever or diarrhea.  She has had some urinary frequency but denies any dysuria.  Every time she attempts to eat she continues to vomit and states she is starting to feel very weak because she cannot hold anything down.  No known sick contacts.  The history is provided by the patient and medical records.  Emesis      Past Medical History:  Diagnosis Date  . Acute hypokalemia 11/26/2014  . Arthritis 05/15/2015  . Benign essential hypertension 05/15/2015  . C. difficile colitis   . CAD (coronary artery disease)    a. cath 04/2017: ""Diffuse, calcific CAD particularly in the distal RCA and proximal to mid LAD.  LAD disease is nonobstructive.  RCA disease is more severe but does not appear significant.  Given her lack of symptoms, would pursue medical therapy."  . CKD (chronic kidney disease), stage III   . Hypokalemia   . Migraine headache 05/15/2015  . Mild mitral regurgitation   . Pancytopenia ([place])   . Sepsis ([place]) 01/2017  . Thrombocytopenia ([place])    a. chronic thrombocytopenia (ITP - remotely saw hematology).  . Vitamin D deficiency     Patient Active Problem List   Diagnosis Date Noted  . Recurrent Clostridioides difficile diarrhea 12/11/2018  . Weakness 12/09/2018  . Hyperbilirubinemia 10/31/2018  . C. difficile diarrhea 10/31/2018  . Other cirrhosis of  liver ([place]) 10/31/2018  . Systolic murmur at cardiac apex 10/31/2018  . Sepsis ([place]) 10/30/2018  . Normocytic anemia 10/23/2018  . SIRS (systemic inflammatory response syndrome) ([place]) 10/16/2018  . Acute renal failure superimposed on stage 3 chronic kidney disease ([place]) 10/16/2018  . Acute lower UTI 10/16/2018  . Elevated troponin 10/16/2018  . Elevated d-dimer 10/16/2018  . UTI (urinary tract infection) 06/25/2018  . Community acquired pneumonia of right middle lobe of lung 06/09/2018  . AKI (acute kidney injury) ([place]) 06/09/2018  . GAD (generalized anxiety disorder) 11/06/2017  . Abnormal findings on diagnostic imaging of heart and coronary circulation   . Family history of coronary artery disease 05/09/2017  . DOE (dyspnea on exertion) 05/09/2017  . Coronary artery calcification seen on CT scan 03/08/2017  . Acute bronchitis 02/06/2017  . Dehydration   . Diarrhea 02/03/2017  . Arthritis 05/15/2015  . Benign essential hypertension 05/15/2015  . Migraine headache 05/15/2015  . Hypokalemia 11/26/2014  . Alkaline phosphatase elevation 11/26/2014  . Abnormal weight gain 11/26/2014  . Chronic leukopenia 05/28/2014  . Anemia of chronic illness 05/28/2014  . Thrombocytopenia ([place]) 04/30/2013  . Elevated liver function tests 04/30/2013    Past Surgical History:  Procedure Laterality Date  . CESAREAN SECTION     24 years ago  . LEFT HEART CATH AND CORONARY ANGIOGRAPHY N/A 05/12/2017   Procedure: LEFT HEART CATH AND CORONARY ANGIOGRAPHY;  Surgeon: [name] Booze, MD;  Location: [facility];  Service: Cardiovascular;  Laterality: N/A;  . tonsils and adneoids     as a child  . ULTRASOUND GUIDANCE FOR VASCULAR ACCESS  05/12/2017   Procedure: Ultrasound Guidance For Vascular Access;  Surgeon: [name] Booze, MD;  Location: [facility];  Service: Cardiovascular;;     OB History   No obstetric history on file.     Family History  Problem Relation Age of Onset   . Pernicious anemia Maternal Grandfather   . Heart attack Maternal Grandfather   . Atrial fibrillation Mother   . Supraventricular tachycardia Father   . Heart disease Paternal Grandfather     Social History   Tobacco Use  . Smoking status: Former Smoker    Years: 2.00    Start date: 06/25/1968    Quit date: 05/16/2014    Years since quitting: 4.7  . Smokeless tobacco: Never Used  Substance Use Topics  . Alcohol use: Yes    Alcohol/week: 1.0 standard drinks    Types: 1 Glasses of wine per week    Comment: occ  . Drug use: No    Home Medications Prior to Admission medications   Medication Sig Start Date End Date Taking? Authorizing Provider  Butalbital-APAP-Caffeine 50-325-40 MG capsule Take 1 capsule by mouth as needed. 02/09/19   [provider]  Cholecalciferol (VITAMIN D3) 2000 units TABS Take 1 tablet by mouth daily.    [provider]  folic acid (FOLVITE) 1 MG tablet Take 1 tablet (1 mg total) by mouth daily. 06/29/18   Raiford Noble Latif, DO  MAGNESIUM PO Take 1 tablet by mouth daily.     [provider]  Multiple Vitamin (MULTIVITAMIN WITH MINERALS) TABS tablet Take 1 tablet by mouth daily. 06/29/18   Sheikh, Omair Latif, DO  ondansetron (ZOFRAN) 4 MG tablet Take 1 tablet (4 mg total) by mouth every 6 (six) hours as needed for nausea. 11/02/18   Rai, Vernelle Emerald, MD  potassium chloride SA (KLOR-CON) 20 MEQ tablet Take 2 tablets (40 mEq total) by mouth on 02/16/19, then take 1 tablet (20 mEq total)  by mouth daily thereafter. 02/16/19   [name], MD  thiamine 100 MG tablet Take 1 tablet (100 mg total) by mouth daily. 06/29/18   Raiford Noble Latif, DO    Allergies    Sulfa antibiotics  Review of Systems   Review of Systems  Gastrointestinal: Positive for vomiting.  All other systems reviewed and are negative.   Physical Exam Updated Vital Signs BP (!) 148/77 (BP Location: Right Arm)   Pulse 75   Temp 97.9 F (36.6 C) (Oral)   Resp  (!) 22   Ht 5' 8"  (1.727 m)   Wt 59 kg   SpO2 100%   BMI 19.77 kg/m   Physical Exam Vitals and nursing note reviewed.  Constitutional:      General: She is not in acute distress.    Appearance: Normal appearance. She is well-developed and normal weight.  HENT:     Head: Normocephalic and atraumatic.     Mouth/Throat:     Mouth: Mucous membranes are dry.  Eyes:     Conjunctiva/sclera: Conjunctivae normal.     Pupils: Pupils are equal, round, and reactive to light.  Cardiovascular:     Rate and Rhythm: Normal rate and regular rhythm.     Pulses: Normal pulses.     Heart sounds: No murmur.  Pulmonary:     Effort: Pulmonary effort is normal.  Tachypnea present. No respiratory distress.     Breath sounds: Examination of the left-lower field reveals rales. Rales present. No wheezing.  Abdominal:     General: There is no distension.     Palpations: Abdomen is soft.     Tenderness: There is no abdominal tenderness. There is no guarding or rebound.  Musculoskeletal:        General: No tenderness. Normal range of motion.     Cervical back: Normal range of motion and neck supple.     Right lower leg: No edema.     Left lower leg: No edema.  Skin:    General: Skin is warm and dry.     Findings: No erythema or rash.  Neurological:     General: No focal deficit present.     Mental Status: She is alert and oriented to person, place, and time. Mental status is at baseline.  Psychiatric:        Mood and Affect: Mood normal.        Behavior: Behavior normal.        Thought Content: Thought content normal.     ED Results / Procedures / Treatments   Labs (all labs ordered are listed, but only abnormal results are displayed) Labs Reviewed  LACTIC ACID, PLASMA - Abnormal; Notable for the following components:      Result Value   Lactic Acid, Venous 2.6 (*)    All other components within normal limits  CBC WITH DIFFERENTIAL/PLATELET - Abnormal; Notable for the following components:    RBC 2.76 (*)    Hemoglobin 8.0 (*)    HCT 25.3 (*)    RDW 19.4 (*)    Platelets 80 (*)    All other components within normal limits  COMPREHENSIVE METABOLIC PANEL - Abnormal; Notable for the following components:   Potassium 2.2 (*)    CO2 18 (*)    Creatinine, Ser 1.39 (*)    Calcium 8.4 (*)    Total Protein 5.1 (*)    Albumin 2.4 (*)    AST 46 (*)    Alkaline Phosphatase 148 (*)    Total Bilirubin 4.1 (*)    GFR calc non Af Amer 40 (*)    GFR calc Af Amer 47 (*)    All other components within normal limits  RESPIRATORY PANEL BY RT PCR (FLU A&B, COVID)  LIPASE, BLOOD  URINALYSIS, ROUTINE W REFLEX MICROSCOPIC  MAGNESIUM  PHOSPHORUS    EKG EKG Interpretation  Date/Time:  Tuesday February 20 2019 08:54:45 EST Ventricular Rate:  84 PR Interval:    QRS Duration: 107 QT Interval:  551 QTC Calculation: 652 R Axis:   3 Text Interpretation: Sinus rhythm Atrial premature complexes LVH with secondary repolarization abnormality Prolonged QT interval No significant change since last tracing Confirmed by Blanchie Dessert ([id number]) on 02/20/2019 11:28:04 AM   Radiology DG Chest Port 1 View  Result Date: 02/20/2019 CLINICAL DATA:  Cough with nausea and vomiting EXAM: PORTABLE CHEST 1 VIEW COMPARISON:  December 09, 2018 FINDINGS: Lungs are clear. Heart is upper normal in size with pulmonary vascularity normal. No adenopathy. No bone lesions. IMPRESSION: Lungs clear.  Heart upper normal in size.  No adenopathy. Electronically Signed   By: Lowella Grip III M.D.   On: 02/20/2019 09:56    Procedures Procedures (including critical care time)  Medications Ordered in ED Medications  lactated ringers bolus 1,000 mL (has no administration in time range)    ED Course  I have  reviewed the triage vital signs and the nursing notes.  Pertinent labs & imaging results that were available during my care of the patient were reviewed by me and considered in my medical decision making (see chart  for details).    MDM Rules/Calculators/A&P                      64 year old female with multiple medical problems presenting today with 6 days of ongoing nausea and vomiting and unable to hold anything down.  Patient has no localized abdominal pain with lower suspicion for acute hepatitis, pancreatitis, obstruction.  Patient does have mild rales in the left lower lobe and a dry cough so concern for Covid or pneumonia.  Also possible atypical cardiac cause.  Patient does appear dehydrated and due to some urinary frequency will also check to ensure no evidence of UTI. Patient given IV fluids.  Last echo on 10/20 showed an EF of 60 to 65%.  She has no evidence of fluid overload today.  Labs and imaging pending  12:14 PM Patient's potassium today is decreased further now at 2.2.  She is having ongoing nausea and received Reglan.  Renal function has remained stable.  Covid is negative, chest x-ray without evidence of pneumonia.  Patient given multiple rounds of IV potassium and will admit for further care.  CRITICAL CARE Performed by: Chermaine Schnyder Total critical care time: 30 minutes Critical care time was exclusive of separately billable procedures and treating other patients. Critical care was necessary to treat or prevent imminent or life-threatening deterioration. Critical care was time spent personally by me on the following activities: development of treatment plan with patient and/or surrogate as well as nursing, discussions with consultants, evaluation of patient's response to treatment, examination of patient, obtaining history from patient or surrogate, ordering and performing treatments and interventions, ordering and review of laboratory studies, ordering and review of radiographic studies, pulse oximetry and re-evaluation of patient's condition.  Final Clinical Impression(s) / ED Diagnoses Final diagnoses:  Dehydration  Non-intractable vomiting with nausea, unspecified vomiting type   Hypokalemia    Rx / DC Orders ED Discharge Orders    None       Blanchie Dessert, MD 02/20/19 1215

## 2019-02-20 NOTE — ED Notes (Signed)
Attempted report, nurse not available at this time

## 2019-02-20 NOTE — ED Triage Notes (Signed)
Patient arrived by EMS from home. Patient c/o N/V and cough x 6 days.   Patient had decreased appetite x 2 days.   Patient denies being around anyone with COVID.   EMS gave 52m of NS and 4 mg of Zofran IV.   Hx of kidney issues and low potassium per EMS.

## 2019-02-20 NOTE — H&P (Signed)
History and Physical    Dana Watkins OJJ:[id number] DOB: 01/24/1955 DOA: 02/20/2019  PCP: Lawerance Cruel, MD   Patient coming from: Home.  I have personally briefly reviewed patient's old medical records in [facility]  Chief Complaint: Nausea, vomiting and cough.  HPI: Dana Watkins is a 64 y.o. female with medical history significant of history of hypokalemia episodes, osteoarthritis, essential hypertension, C. difficile colitis, CAD, chronic kidney disease, migraine headaches, mild mitral regurgitation, pancytopenia, history of sepsis, history of ITP, vitamin D deficiency who is coming to the emergency department with complaints of several days of cough, nausea and vomiting.  Her cough is nonproductive.  She denies abdominal pain, diarrhea, constipation, melena or hematochezia.  No dysuria, frequency or hematuria.  She denies fever, chills or night sweats.  No headaches, rhinorrhea or sore throat.  No wheezing or hemoptysis.  Denies chest pain, palpitations, dizziness, diaphoresis, PND, orthopnea or pitting edema of the lower extremities.  She denies polyuria, polydipsia, polyphagia or blurred vision.  ED Course: Initial vital signs temperature 97.9 F, pulse 75, respirations 22, blood pressure 140/77 mmHg O2 sat 100% on room air.  The patient was given 1000 mL LR bolus, metoclopramide 10 mg IVP and KCl 10 mEq IVPB x5.  Urinalysis showed glucosuria 150 mg/dL, small hemoglobinuria, trace leukocyte esterase and many bacteria on microscopic examination.  The rest of the values are unremarkable.  CBC showed a white count is 8.6, hemoglobin 8.0 g/dL and platelets 80.  SARS 2, influenza AMB by PCR was negative.  Lactic acid is 2.6, potassium is 2.2, and CO2 18 mmol/L.  Creatinine was 1.39, calcium 8.4 and total bilirubin 4.1 mg/dL.  Total protein 5.1 and albumin 2.4 g/dL.  Lipase was normal.  AST 46, ALT was normal alkaline phosphatase 148 units/L.  Her chest radiograph shows clear lungs  with high upper normal in size.  There were no adenopathies.  Review of Systems: As per HPI otherwise 10 point review of systems negative.   Past Medical History:  Diagnosis Date  . Acute hypokalemia 11/26/2014  . Arthritis 05/15/2015  . Benign essential hypertension 05/15/2015  . C. difficile colitis   . CAD (coronary artery disease)    a. cath 04/2017: ""Diffuse, calcific CAD particularly in the distal RCA and proximal to mid LAD.  LAD disease is nonobstructive.  RCA disease is more severe but does not appear significant.  Given her lack of symptoms, would pursue medical therapy."  . CKD (chronic kidney disease), stage III   . Hypokalemia   . Migraine headache 05/15/2015  . Mild mitral regurgitation   . Pancytopenia ([place])   . Sepsis ([place]) 01/2017  . Thrombocytopenia ([place])    a. chronic thrombocytopenia (ITP - remotely saw hematology).  . Vitamin D deficiency     Past Surgical History:  Procedure Laterality Date  . CESAREAN SECTION     24 years ago  . LEFT HEART CATH AND CORONARY ANGIOGRAPHY N/A 05/12/2017   Procedure: LEFT HEART CATH AND CORONARY ANGIOGRAPHY;  Surgeon: [name] Booze, MD;  Location: [facility];  Service: Cardiovascular;  Laterality: N/A;  . tonsils and adneoids     as a child  . ULTRASOUND GUIDANCE FOR VASCULAR ACCESS  05/12/2017   Procedure: Ultrasound Guidance For Vascular Access;  Surgeon: [name] Booze, MD;  Location: [facility];  Service: Cardiovascular;;     reports that she quit smoking about 4 years ago. She started smoking about 50 years ago. She  quit after 2.00 years of use. She has never used smokeless tobacco. She reports current alcohol use of about 1.0 standard drinks of alcohol per week. She reports that she does not use drugs.  Allergies  Allergen Reactions  . Sulfa Antibiotics Nausea And Vomiting and Rash    Family History  Problem Relation Age of Onset  . Pernicious anemia Maternal Grandfather   . Heart attack  Maternal Grandfather   . Atrial fibrillation Mother   . Supraventricular tachycardia Father   . Heart disease Paternal Grandfather    Prior to Admission medications   Medication Sig Start Date End Date Taking? Authorizing Provider  acetaminophen (TYLENOL) 500 MG tablet Take 500 mg by mouth every 6 (six) hours as needed (For pain.).   Yes [provider]  Butalbital-APAP-Caffeine [phone] MG capsule Take 1 capsule by mouth every 6 (six) hours as needed for pain or headache.  02/09/19  Yes [provider]  Cholecalciferol (VITAMIN D3) 2000 units TABS Take 2,000 Units by mouth daily.    Yes [provider]  folic acid (FOLVITE) 1 MG tablet Take 1 tablet (1 mg total) by mouth daily. 06/29/18  Yes Sheikh, Omair Latif, DO  MAGNESIUM PO Take 1 tablet by mouth daily.    Yes [provider]  Multiple Vitamin (MULTIVITAMIN WITH MINERALS) TABS tablet Take 1 tablet by mouth daily. 06/29/18  Yes Sheikh, Omair Latif, DO  ondansetron (ZOFRAN) 4 MG tablet Take 1 tablet (4 mg total) by mouth every 6 (six) hours as needed for nausea. 11/02/18  Yes Rai, Ripudeep K, MD  potassium chloride SA (KLOR-CON) 20 MEQ tablet Take 2 tablets (40 mEq total) by mouth on 02/16/19, then take 1 tablet (20 mEq total)  by mouth daily thereafter. Patient taking differently: Take 20 mEq by mouth daily.  02/16/19  Yes [name], MD  thiamine 100 MG tablet Take 1 tablet (100 mg total) by mouth daily. 06/29/18  Yes Raiford Noble [place], [place]    Physical Exam: Vitals:   02/20/19 0846 02/20/19 0856 02/20/19 0930 02/20/19 1130  BP:  (!) 148/77 (!) 151/88 140/65  Pulse:  75 78 85  Resp:  (!) 22 16 (!) 28  Temp:  97.9 F (36.6 C)    TempSrc:  Oral    SpO2: 100% 100% 100% 98%  Weight:  59 kg    Height:  5' 8"  (1.727 m)      Constitutional: NAD, calm, comfortable Eyes: PERRL, lids and conjunctivae normal ENMT: Mucous membranes and lips are dry. Posterior pharynx clear of any exudate or  lesions. Neck: normal, supple, no masses, no thyromegaly Respiratory: clear to auscultation bilaterally, no wheezing, no crackles. Normal respiratory effort. No accessory muscle use.  Cardiovascular: Regular rate and rhythm, no murmurs / rubs / gallops. No extremity edema. 2+ pedal pulses. No carotid bruits.  Abdomen: Nondistended.  BS positive.  Soft, no tenderness, no masses palpated. No hepatosplenomegaly. Musculoskeletal: Generalized muscle weakness.  No clubbing / cyanosis. Good ROM, no contractures. Normal muscle tone.  Skin: no rashes, lesions, ulcers on limited dermatological examination. Neurologic: CN 2-12 grossly intact. Sensation intact, DTR normal.  Nonfocal generalized weakness.Marland Kitchen  Psychiatric: Normal judgment and insight. Alert and oriented x 3. Normal mood.   Labs on Admission: I have personally reviewed following labs and imaging studies  CBC: Recent Labs  Lab 02/15/19 1012 02/20/19 0906  WBC 5.3 8.6  NEUTROABS 3.2 6.6  HGB 7.9* 8.0*  HCT 23.2* 25.3*  MCV 84 91.7  PLT  82* 80*   Basic Metabolic Panel: Recent Labs  Lab 02/15/19 1012 02/20/19 1005  NA 139 137  K 2.6* 2.2*  CL 110* 110  CO2 16* 18*  GLUCOSE 97 87  BUN 11 11  CREATININE 1.73* 1.39*  CALCIUM 9.5 8.4*  MG  --  1.8  PHOS  --  1.2*   GFR: Estimated Creatinine Clearance: 38.6 mL/min (A) (by C-G formula based on SCr of 1.39 mg/dL (H)). Liver Function Tests: Recent Labs  Lab 02/15/19 1012 02/20/19 1005  AST 63* 46*  ALT 28 26  ALKPHOS 248* 148*  BILITOT 2.9* 4.1*  PROT 6.0 5.1*  ALBUMIN 3.1* 2.4*   Recent Labs  Lab 02/20/19 1005  LIPASE 51   No results for input(s): AMMONIA in the last 168 hours. Coagulation Profile: No results for input(s): INR, PROTIME in the last 168 hours. Cardiac Enzymes: No results for input(s): CKTOTAL, CKMB, CKMBINDEX, TROPONINI in the last 168 hours. BNP (last 3 results) No results for input(s): PROBNP in the last 8760 hours. HbA1C: No results for  input(s): HGBA1C in the last 72 hours. CBG: No results for input(s): GLUCAP in the last 168 hours. Lipid Profile: No results for input(s): CHOL, HDL, LDLCALC, TRIG, CHOLHDL, LDLDIRECT in the last 72 hours. Thyroid Function Tests: No results for input(s): TSH, T4TOTAL, FREET4, T3FREE, THYROIDAB in the last 72 hours. Anemia Panel: No results for input(s): VITAMINB12, FOLATE, FERRITIN, TIBC, IRON, RETICCTPCT in the last 72 hours. Urine analysis:    Component Value Date/Time   COLORURINE YELLOW 02/20/2019 1120   APPEARANCEUR HAZY (A) 02/20/2019 1120   LABSPEC 1.005 02/20/2019 1120   PHURINE 7.0 02/20/2019 1120   GLUCOSEU 150 (A) 02/20/2019 1120   HGBUR SMALL (A) 02/20/2019 1120   BILIRUBINUR NEGATIVE 02/20/2019 1120   KETONESUR NEGATIVE 02/20/2019 1120   PROTEINUR NEGATIVE 02/20/2019 1120   NITRITE NEGATIVE 02/20/2019 1120   LEUKOCYTESUR TRACE (A) 02/20/2019 1120    Radiological Exams on Admission: DG Chest Port 1 View  Result Date: 02/20/2019 CLINICAL DATA:  Cough with nausea and vomiting EXAM: PORTABLE CHEST 1 VIEW COMPARISON:  December 09, 2018 FINDINGS: Lungs are clear. Heart is upper normal in size with pulmonary vascularity normal. No adenopathy. No bone lesions. IMPRESSION: Lungs clear.  Heart upper normal in size.  No adenopathy. Electronically Signed   By: Lowella Grip III M.D.   On: 02/20/2019 09:56    EKG: Independently reviewed. Vent. rate 84 BPM PR interval * ms QRS duration 107 ms QT/QTc 551/652 ms P-R-T axes 36 3 -53 Sinus rhythm Atrial premature complexes LVH with secondary repolarization abnormality Prolonged QT interval  Assessment/Plan Principal Problem:   Hypokalemia Observation/telemetry. Continue potassium supplementation. Continue IV fluids. Magnesium has been supplemented. Follow-up renal function electrolytes.  Active Problems:   Normocytic anemia Monitor H&H.    Hyperbilirubinemia Continue IV hydration. Recheck bilirubin level in  a.m.    Hypophosphatemia Replacing. Follow-up level in a.m.    Coronary artery calcification seen on CT scan Denies chest pain or dyspnea. Not on medical treatment. Should follow-up with PCP and/or cardiology.    Benign essential hypertension Not on antihypertensives. As needed management if BP elevates.  DVT prophylaxis: SCDs. Code Status: Full code. Family Communication: Disposition Plan: Observation for IV hydration, electrolyte replacement and symptoms treatment.   Consults called: Admission status: Observation/telemetry.   Reubin Milan MD [facility]  If 7PM-7AM, please contact night-coverage [email or web address]  02/20/2019, 12:47 PM   This document was prepared using Dragon voice recognition software  and may contain some unintended transcription errors.

## 2019-02-21 DIAGNOSIS — E876 Hypokalemia: Secondary | ICD-10-CM | POA: Diagnosis present

## 2019-02-21 DIAGNOSIS — Z8249 Family history of ischemic heart disease and other diseases of the circulatory system: Secondary | ICD-10-CM | POA: Diagnosis not present

## 2019-02-21 DIAGNOSIS — I129 Hypertensive chronic kidney disease with stage 1 through stage 4 chronic kidney disease, or unspecified chronic kidney disease: Secondary | ICD-10-CM | POA: Diagnosis present

## 2019-02-21 DIAGNOSIS — R509 Fever, unspecified: Secondary | ICD-10-CM | POA: Diagnosis present

## 2019-02-21 DIAGNOSIS — E559 Vitamin D deficiency, unspecified: Secondary | ICD-10-CM | POA: Diagnosis present

## 2019-02-21 DIAGNOSIS — I10 Essential (primary) hypertension: Secondary | ICD-10-CM

## 2019-02-21 DIAGNOSIS — G43909 Migraine, unspecified, not intractable, without status migrainosus: Secondary | ICD-10-CM | POA: Diagnosis present

## 2019-02-21 DIAGNOSIS — E860 Dehydration: Secondary | ICD-10-CM | POA: Diagnosis present

## 2019-02-21 DIAGNOSIS — R05 Cough: Secondary | ICD-10-CM | POA: Diagnosis present

## 2019-02-21 DIAGNOSIS — N1832 Chronic kidney disease, stage 3b: Secondary | ICD-10-CM | POA: Diagnosis present

## 2019-02-21 DIAGNOSIS — R823 Hemoglobinuria: Secondary | ICD-10-CM | POA: Diagnosis present

## 2019-02-21 DIAGNOSIS — I340 Nonrheumatic mitral (valve) insufficiency: Secondary | ICD-10-CM | POA: Diagnosis present

## 2019-02-21 DIAGNOSIS — R350 Frequency of micturition: Secondary | ICD-10-CM | POA: Diagnosis present

## 2019-02-21 DIAGNOSIS — Z882 Allergy status to sulfonamides status: Secondary | ICD-10-CM | POA: Diagnosis not present

## 2019-02-21 DIAGNOSIS — K7460 Unspecified cirrhosis of liver: Secondary | ICD-10-CM | POA: Diagnosis present

## 2019-02-21 DIAGNOSIS — Z862 Personal history of diseases of the blood and blood-forming organs and certain disorders involving the immune mechanism: Secondary | ICD-10-CM | POA: Diagnosis not present

## 2019-02-21 DIAGNOSIS — R81 Glycosuria: Secondary | ICD-10-CM | POA: Diagnosis present

## 2019-02-21 DIAGNOSIS — Z87891 Personal history of nicotine dependence: Secondary | ICD-10-CM | POA: Diagnosis not present

## 2019-02-21 DIAGNOSIS — Z20822 Contact with and (suspected) exposure to covid-19: Secondary | ICD-10-CM | POA: Diagnosis present

## 2019-02-21 DIAGNOSIS — I2510 Atherosclerotic heart disease of native coronary artery without angina pectoris: Secondary | ICD-10-CM | POA: Diagnosis present

## 2019-02-21 DIAGNOSIS — D649 Anemia, unspecified: Secondary | ICD-10-CM | POA: Diagnosis present

## 2019-02-21 LAB — COMPREHENSIVE METABOLIC PANEL
ALT: 27 U/L (ref 0–44)
AST: 44 U/L — ABNORMAL HIGH (ref 15–41)
Albumin: 2.3 g/dL — ABNORMAL LOW (ref 3.5–5.0)
Alkaline Phosphatase: 146 U/L — ABNORMAL HIGH (ref 38–126)
Anion gap: 6 (ref 5–15)
BUN: 10 mg/dL (ref 8–23)
CO2: 17 mmol/L — ABNORMAL LOW (ref 22–32)
Calcium: 8.4 mg/dL — ABNORMAL LOW (ref 8.9–10.3)
Chloride: 119 mmol/L — ABNORMAL HIGH (ref 98–111)
Creatinine, Ser: 1.58 mg/dL — ABNORMAL HIGH (ref 0.44–1.00)
GFR calc Af Amer: 40 mL/min — ABNORMAL LOW (ref >60)
GFR calc non Af Amer: 34 mL/min — ABNORMAL LOW (ref >60)
Glucose, Bld: 97 mg/dL (ref 70–99)
Potassium: 3 mmol/L — ABNORMAL LOW (ref 3.5–5.1)
Sodium: 142 mmol/L (ref 135–145)
Total Bilirubin: 3.9 mg/dL — ABNORMAL HIGH (ref 0.3–1.2)
Total Protein: 5.2 g/dL — ABNORMAL LOW (ref 6.5–8.1)

## 2019-02-21 LAB — FERRITIN: Ferritin: 24 ng/mL (ref 11–307)

## 2019-02-21 LAB — CBC
HCT: 25 % — ABNORMAL LOW (ref 36.0–46.0)
Hemoglobin: 7.7 g/dL — ABNORMAL LOW (ref 12.0–15.0)
MCH: 28.9 pg (ref 26.0–34.0)
MCHC: 30.8 g/dL (ref 30.0–36.0)
MCV: 94 fL (ref 80.0–100.0)
Platelets: 73 10*3/uL — ABNORMAL LOW (ref 150–400)
RBC: 2.66 MIL/uL — ABNORMAL LOW (ref 3.87–5.11)
RDW: 19.5 % — ABNORMAL HIGH (ref 11.5–15.5)
WBC: 7.8 10*3/uL (ref 4.0–10.5)
nRBC: 0 % (ref 0.0–0.2)

## 2019-02-21 LAB — D-DIMER, QUANTITATIVE: D-Dimer, Quant: 1.37 ug/mL-FEU — ABNORMAL HIGH (ref 0.00–0.50)

## 2019-02-21 LAB — RESPIRATORY PANEL BY RT PCR (FLU A&B, COVID)
Influenza A by PCR: NEGATIVE
Influenza B by PCR: NEGATIVE
SARS Coronavirus 2 by RT PCR: NEGATIVE

## 2019-02-21 LAB — C-REACTIVE PROTEIN: CRP: 0.6 mg/dL (ref <1.0)

## 2019-02-21 MED ORDER — BENZONATATE 100 MG PO CAPS
100.0000 mg | ORAL_CAPSULE | Freq: Three times a day (TID) | ORAL | Status: DC | PRN
  Administered 2019-02-21: 100 mg via ORAL
  Filled 2019-02-21: qty 1

## 2019-02-21 MED ORDER — DICLOFENAC SODIUM 1 % EX GEL
2.0000 g | Freq: Four times a day (QID) | CUTANEOUS | Status: DC
  Administered 2019-02-21 (×2): 2 g via TOPICAL
  Filled 2019-02-21: qty 100

## 2019-02-21 MED ORDER — ACETAMINOPHEN 325 MG PO TABS
650.0000 mg | ORAL_TABLET | Freq: Four times a day (QID) | ORAL | Status: DC | PRN
  Administered 2019-02-21: 650 mg via ORAL
  Filled 2019-02-21: qty 2

## 2019-02-21 NOTE — Plan of Care (Signed)

## 2019-02-21 NOTE — Progress Notes (Signed)
PROGRESS NOTE    Dana Watkins  TDD:[id number] DOB: 1956-01-10 DOA: 02/20/2019 PCP: Lawerance Cruel, MD    Brief Narrative:  64 y.o. female with medical history significant of history of hypokalemia episodes, osteoarthritis, essential hypertension, C. difficile colitis, CAD, chronic kidney disease, migraine headaches, mild mitral regurgitation, pancytopenia, history of sepsis, history of ITP, vitamin D deficiency who is coming to the emergency department with complaints of several days of cough, nausea and vomiting.  Her cough is nonproductive.  She denies abdominal pain, diarrhea, constipation, melena or hematochezia.  No dysuria, frequency or hematuria.  She denies fever, chills or night sweats.  No headaches, rhinorrhea or sore throat.  No wheezing or hemoptysis.  Denies chest pain, palpitations, dizziness, diaphoresis, PND, orthopnea or pitting edema of the lower extremities.  She denies polyuria, polydipsia, polyphagia or blurred vision.  ED Course: Initial vital signs temperature 97.9 F, pulse 75, respirations 22, blood pressure 140/77 mmHg O2 sat 100% on room air.  The patient was given 1000 mL LR bolus, metoclopramide 10 mg IVP and KCl 10 mEq IVPB x5.  Urinalysis showed glucosuria 150 mg/dL, small hemoglobinuria, trace leukocyte esterase and many bacteria on microscopic examination.  The rest of the values are unremarkable.  CBC showed a white count is 8.6, hemoglobin 8.0 g/dL and platelets 80.  SARS 2, influenza AMB by PCR was negative.  Lactic acid is 2.6, potassium is 2.2, and CO2 18 mmol/L.  Creatinine was 1.39, calcium 8.4 and total bilirubin 4.1 mg/dL.  Total protein 5.1 and albumin 2.4 g/dL.  Lipase was normal.  AST 46, ALT was normal alkaline phosphatase 148 units/L.  Her chest radiograph shows clear lungs with high upper normal in size.  There were no adenopathies.  Assessment & Plan:   Principal Problem:   Hypokalemia Active Problems:   Coronary artery calcification seen  on CT scan   Benign essential hypertension   Normocytic anemia   Hyperbilirubinemia   Hypophosphatemia   Fever  Principal Problem:   Hypokalemia -Likely secondary to poor PO intake and n/v -Potassium levels improved with replacement -Repeat bmet in AM  Active Problems:   Normocytic anemia -Hemodynamically stable    Hyperbilirubinemia -Continue IV hydration as tolerated -repeat LFT's in AM    Hypophosphatemia -Replaced -Repeat lytes in AM    Coronary artery calcification seen on CT scan -Denies chest pain or dyspnea. -Not on medical treatment. -Recommend follow-up with PCP and/or cardiology.    Benign essential hypertension -Not on antihypertensives. -cont to titrate bp meds as tolerated  Fevers, chills, cough -presenting CXR neg -UA clear -Will check blood cx -Pt did present with cough and nausea, decreased appetite x 1 week prior to visit. Initially afebrile, now febrile -COVID RNA neg -Flu neg -Will check inflammatory markers   DVT prophylaxis: SCD's Code Status: Full Family Communication: Pt in room, family not at bedside Disposition Plan: Uncertain at this time, febrile  Consultants:     Procedures:     Antimicrobials: Anti-infectives (From admission, onward)   None       Subjective: Feeling chills, weak  Objective: Vitals:   02/20/19 2004 02/21/19 0425 02/21/19 1417 02/21/19 1700  BP: (!) 120/58 116/65 (!) 117/98   Pulse: 86 84 100   Resp: 16 16 (!) 22   Temp: 98.9 F (37.2 C) 98.4 F (36.9 C) (!) 101.8 F (38.8 C) 100.1 F (37.8 C)  TempSrc: Oral Oral Oral   SpO2: 98% 99% 99%   Weight:  Height:        Intake/Output Summary (Last 24 hours) at 02/21/2019 1835 Last data filed at 02/21/2019 1700 Gross per 24 hour  Intake 3309.77 ml  Output 3300 ml  Net 9.77 ml   Filed Weights   02/20/19 0856  Weight: 59 kg    Examination:  General exam: Appears calm and comfortable  Respiratory system: Clear to auscultation.  Respiratory effort normal. Cardiovascular system: S1 & S2 heard, Regular Gastrointestinal system: Abdomen is nondistended, soft and nontender. No organomegaly or masses felt. Normal bowel sounds heard. Central nervous system: Alert and oriented. No focal neurological deficits. Extremities: Symmetric 5 x 5 power. Skin: No rashes, lesions Psychiatry: Judgement and insight appear normal. Mood & affect appropriate.   Data Reviewed: I have personally reviewed following labs and imaging studies  CBC: Recent Labs  Lab 02/15/19 1012 02/20/19 0906 02/21/19 0418  WBC 5.3 8.6 7.8  NEUTROABS 3.2 6.6  --   HGB 7.9* 8.0* 7.7*  HCT 23.2* 25.3* 25.0*  MCV 84 91.7 94.0  PLT 82* 80* 73*   Basic Metabolic Panel: Recent Labs  Lab 02/15/19 1012 02/20/19 1005 02/21/19 0418  NA 139 137 142  K 2.6* 2.2* 3.0*  CL 110* 110 119*  CO2 16* 18* 17*  GLUCOSE 97 87 97  BUN 11 11 10   CREATININE 1.73* 1.39* 1.58*  CALCIUM 9.5 8.4* 8.4*  MG  --  1.8  --   PHOS  --  1.2*  --    GFR: Estimated Creatinine Clearance: 33.9 mL/min (A) (by C-G formula based on SCr of 1.58 mg/dL (H)). Liver Function Tests: Recent Labs  Lab 02/15/19 1012 02/20/19 1005 02/21/19 0418  AST 63* 46* 44*  ALT 28 26 27   ALKPHOS 248* 148* 146*  BILITOT 2.9* 4.1* 3.9*  PROT 6.0 5.1* 5.2*  ALBUMIN 3.1* 2.4* 2.3*   Recent Labs  Lab 02/20/19 1005  LIPASE 51   No results for input(s): AMMONIA in the last 168 hours. Coagulation Profile: No results for input(s): INR, PROTIME in the last 168 hours. Cardiac Enzymes: No results for input(s): CKTOTAL, CKMB, CKMBINDEX, TROPONINI in the last 168 hours. BNP (last 3 results) No results for input(s): PROBNP in the last 8760 hours. HbA1C: No results for input(s): HGBA1C in the last 72 hours. CBG: No results for input(s): GLUCAP in the last 168 hours. Lipid Profile: No results for input(s): CHOL, HDL, LDLCALC, TRIG, CHOLHDL, LDLDIRECT in the last 72 hours. Thyroid Function  Tests: No results for input(s): TSH, T4TOTAL, FREET4, T3FREE, THYROIDAB in the last 72 hours. Anemia Panel: No results for input(s): VITAMINB12, FOLATE, FERRITIN, TIBC, IRON, RETICCTPCT in the last 72 hours. Sepsis Labs: Recent Labs  Lab 02/20/19 0930  LATICACIDVEN 2.6*    Recent Results (from the past 240 hour(s))  Respiratory Panel by RT PCR (Flu A&B, Covid) - Nasopharyngeal Swab     Status: None   Collection Time: 02/20/19  9:31 AM   Specimen: Nasopharyngeal Swab  Result Value Ref Range Status   SARS Coronavirus 2 by RT PCR NEGATIVE NEGATIVE Final    Comment: (NOTE) SARS-CoV-2 target nucleic acids are NOT DETECTED. The SARS-CoV-2 RNA is generally detectable in upper respiratoy specimens during the acute phase of infection. The lowest concentration of SARS-CoV-2 viral copies this assay can detect is 131 copies/mL. A negative result does not preclude SARS-Cov-2 infection and should not be used as the sole basis for treatment or other patient management decisions. A negative result may occur with  improper specimen  collection/handling, submission of specimen other than nasopharyngeal swab, presence of viral mutation(s) within the areas targeted by this assay, and inadequate number of viral copies (<131 copies/mL). A negative result must be combined with clinical observations, patient history, and epidemiological information. The expected result is Negative. Fact Sheet for Patients:  PinkCheek.be Fact Sheet for Healthcare Providers:  GravelBags.it This test is not yet ap proved or cleared by the [place] FDA and  has been authorized for detection and/or diagnosis of SARS-CoV-2 by FDA under an Emergency Use Authorization (EUA). This EUA will remain  in effect (meaning this test can be used) for the duration of the COVID-19 declaration under Section 564(b)(1) of the Act, 21 U.S.C. section 360bbb-3(b)(1), unless the  authorization is terminated or revoked sooner.    Influenza A by PCR NEGATIVE NEGATIVE Final   Influenza B by PCR NEGATIVE NEGATIVE Final    Comment: (NOTE) The Xpert Xpress SARS-CoV-2/FLU/RSV assay is intended as an aid in  the diagnosis of influenza from Nasopharyngeal swab specimens and  should not be used as a sole basis for treatment. Nasal washings and  aspirates are unacceptable for Xpert Xpress SARS-CoV-2/FLU/RSV  testing. Fact Sheet for Patients: PinkCheek.be Fact Sheet for Healthcare Providers: GravelBags.it This test is not yet approved or cleared by the [place] FDA and  has been authorized for detection and/or diagnosis of SARS-CoV-2 by  FDA under an Emergency Use Authorization (EUA). This EUA will remain  in effect (meaning this test can be used) for the duration of the  Covid-19 declaration under Section 564(b)(1) of the Act, 21  U.S.C. section 360bbb-3(b)(1), unless the authorization is  terminated or revoked. Performed at [facility], [place] [address] [place], [place] [address]      Radiology Studies: [facility] Chest Port 1 View  Result Date: 02/20/2019 CLINICAL DATA:  Cough with nausea and vomiting EXAM: PORTABLE CHEST 1 VIEW COMPARISON:  December 09, 2018 FINDINGS: Lungs are clear. Heart is upper normal in size with pulmonary vascularity normal. No adenopathy. No bone lesions. IMPRESSION: Lungs clear.  Heart upper normal in size.  No adenopathy. Electronically Signed   By: Lowella Grip III M.D.   On: 02/20/2019 09:56    Scheduled Meds: . diclofenac Sodium  2 g Topical QID  . thiamine  100 mg Oral Daily   Continuous Infusions: . 0.9 % NaCl with KCl 40 mEq / L 125 mL/hr (02/21/19 1715)     LOS: 0 days   Marylu Lund, MD [facility] Pager On Amion  If 7PM-7AM, please contact night-coverage 02/21/2019, 6:35 PM

## 2019-02-21 NOTE — Plan of Care (Signed)

## 2019-02-22 LAB — COMPREHENSIVE METABOLIC PANEL
ALT: 28 U/L (ref 0–44)
AST: 48 U/L — ABNORMAL HIGH (ref 15–41)
Albumin: 2.5 g/dL — ABNORMAL LOW (ref 3.5–5.0)
Alkaline Phosphatase: 164 U/L — ABNORMAL HIGH (ref 38–126)
Anion gap: 6 (ref 5–15)
BUN: 8 mg/dL (ref 8–23)
CO2: 15 mmol/L — ABNORMAL LOW (ref 22–32)
Calcium: 8.4 mg/dL — ABNORMAL LOW (ref 8.9–10.3)
Chloride: 118 mmol/L — ABNORMAL HIGH (ref 98–111)
Creatinine, Ser: 1.52 mg/dL — ABNORMAL HIGH (ref 0.44–1.00)
GFR calc Af Amer: 42 mL/min — ABNORMAL LOW (ref >60)
GFR calc non Af Amer: 36 mL/min — ABNORMAL LOW (ref >60)
Glucose, Bld: 103 mg/dL — ABNORMAL HIGH (ref 70–99)
Potassium: 3.3 mmol/L — ABNORMAL LOW (ref 3.5–5.1)
Sodium: 139 mmol/L (ref 135–145)
Total Bilirubin: 4.4 mg/dL — ABNORMAL HIGH (ref 0.3–1.2)
Total Protein: 5.6 g/dL — ABNORMAL LOW (ref 6.5–8.1)

## 2019-02-22 LAB — CBC
HCT: 27.6 % — ABNORMAL LOW (ref 36.0–46.0)
Hemoglobin: 8.5 g/dL — ABNORMAL LOW (ref 12.0–15.0)
MCH: 29.1 pg (ref 26.0–34.0)
MCHC: 30.8 g/dL (ref 30.0–36.0)
MCV: 94.5 fL (ref 80.0–100.0)
Platelets: 85 10*3/uL — ABNORMAL LOW (ref 150–400)
RBC: 2.92 MIL/uL — ABNORMAL LOW (ref 3.87–5.11)
RDW: 19.6 % — ABNORMAL HIGH (ref 11.5–15.5)
WBC: 13.4 10*3/uL — ABNORMAL HIGH (ref 4.0–10.5)
nRBC: 0 % (ref 0.0–0.2)

## 2019-02-22 MED ORDER — AMOXICILLIN-POT CLAVULANATE 875-125 MG PO TABS: 1.0000 | ORAL_TABLET | Freq: Two times a day (BID) | ORAL | 0 refills | Status: AC

## 2019-02-22 MED ORDER — POTASSIUM CHLORIDE 20 MEQ PO PACK
40.0000 meq | PACK | Freq: Once | ORAL | Status: AC
  Administered 2019-02-22: 40 meq via ORAL
  Filled 2019-02-22: qty 2

## 2019-02-22 MED FILL — AMOX-CLAV 875-125 MG TABLET: 875-125 | 5 days supply | Qty: 10 | Fill #0

## 2019-02-22 NOTE — Discharge Summary (Signed)
Physician Discharge Summary  Dana Watkins PJK:[id number] DOB: 1955-06-18 DOA: 02/20/2019  PCP: Lawerance Cruel, MD  Admit date: 02/20/2019 Discharge date: 02/22/2019  Admitted From: Home Disposition:  Home  Recommendations for Outpatient Follow-up:  1. Follow up with PCP in 1-2 weeks 2. Follow up with GI 3. Recommend repeat LFT's and CBC within one week  Discharge Condition:Improved CODE STATUS:Full Diet recommendation: Regular   Brief/Interim Summary: 64 y.o.femalewith medical history significant ofhistory of hypokalemia episodes, osteoarthritis, essential hypertension, C. difficile colitis, CAD, chronic kidney disease, migraine headaches, mild mitral regurgitation, pancytopenia, history of sepsis, history of ITP, vitamin D deficiency who is coming to the emergency department with complaints of several days of cough, nausea and vomiting.Her cough is nonproductive. She denies abdominal pain, diarrhea, constipation, melena or hematochezia. No dysuria, frequency or hematuria. She denies fever, chills or night sweats. No headaches, rhinorrhea or sore throat. No wheezing or hemoptysis. Denies chest pain, palpitations, dizziness, diaphoresis, PND, orthopnea or pitting edema of the lower extremities. She denies polyuria, polydipsia, polyphagia or blurred vision.  ED Course:Initial vital signs temperature 97.9 F, pulse 75, respirations 22, blood pressure 140/77 mmHg O2 sat 100% on room air. The patient was given 1000 mL LR bolus, metoclopramide 10 mg IVP and KCl 10 mEq IVPB x5.  Urinalysis showed glucosuria 150 mg/dL, small hemoglobinuria, trace leukocyte esterase and many bacteria on microscopic examination. The rest of the values are unremarkable. CBC showed a white count is 8.6, hemoglobin 8.0 g/dL and platelets 80. SARS 2, influenza AMB by PCR was negative. Lactic acid is 2.6, potassium is 2.2, and CO2 18 mmol/L. Creatinine was 1.39, calcium 8.4 and total bilirubin 4.1  mg/dL. Total protein 5.1 and albumin 2.4 g/dL. Lipase was normal. AST 46, ALT was normal alkaline phosphatase 148 units/L. Her chest radiograph shows clear lungs with high upper normal in size. There were no adenopathies.  Discharge Diagnoses:  Principal Problem:   Hypokalemia Active Problems:   Coronary artery calcification seen on CT scan   Benign essential hypertension   Normocytic anemia   Hyperbilirubinemia   Hypophosphatemia   Fever  Principal Problem: Hypokalemia -Likely secondary to poor PO intake and n/v, both now resolved -Potassium levels improved with replacement -Recommend repeat BMET in one week  Active Problems: Normocytic anemia -Hemodynamically stable this visit  Hyperbilirubinemia with cirrhosis -Chart reviewed. On prior admit in 11/20, LFT's were noted to elevated as well, documented to be stable for pt at the time -[place] from 10/20 reviewed, findings of solitary gallstone -Discussed case with General Surgery. Given hx of cirrhosis, pt is not a good candidate for surgery with recommendation for medical management for now. Recommendation for empiric augmentin at d/c per Surgery -Pt is to follow up with GI as outpatient. Recommend repeat LFT's in 1-2 weeks  Hypophosphatemia -Replaced -Recommend repeat electrolytes in 1 week  Coronary artery calcification seen on CT scan -Denies chest pain or dyspnea. -Not on medical treatment. -Recommend follow-up with PCP and/or cardiology.  Benign essential hypertension -Not on antihypertensives. -cont to titrate bp meds as tolerated  Fevers, chills, cough -presenting CXR neg -UA clear -Will check blood cx -Pt did present with cough and nausea, decreased appetite x 1 week prior to visit. Initially afebrile, now febrile -COVID RNA neg -Flu neg -inflammatory markers neg -By following day, symptoms all resolved and pt tolerated regular diet without issues with no abd pain or n/v or  diarrhea -Afebrile at time of d/c  Discharge Instructions   Allergies as of 02/22/2019  Reactions   Sulfa Antibiotics Nausea And Vomiting, Rash      Medication List    STOP taking these medications   potassium chloride SA 20 MEQ tablet Commonly known as: KLOR-CON     TAKE these medications   acetaminophen 500 MG tablet Commonly known as: TYLENOL Take 500 mg by mouth every 6 (six) hours as needed (For pain.).   Butalbital-APAP-Caffeine 50-325-40 MG capsule Take 1 capsule by mouth every 6 (six) hours as needed for pain or headache.   folic acid 1 MG tablet Commonly known as: FOLVITE Take 1 tablet (1 mg total) by mouth daily.   MAGNESIUM PO Take 1 tablet by mouth daily.   multivitamin with minerals Tabs tablet Take 1 tablet by mouth daily.   ondansetron 4 MG tablet Commonly known as: ZOFRAN Take 1 tablet (4 mg total) by mouth every 6 (six) hours as needed for nausea.   thiamine 100 MG tablet Take 1 tablet (100 mg total) by mouth daily.   Vitamin D3 50 MCG (2000 UT) Tabs Take 2,000 Units by mouth daily.      Follow-up Information    Lawerance Cruel, MD. Schedule an appointment as soon as possible for a visit in 1 week(s).   Specialty: Family Medicine Contact information: [place] [address] [phone]        [name], MD .   Specialty: Cardiology Contact information: [place] [phone]          Allergies  Allergen Reactions  . Sulfa Antibiotics Nausea And Vomiting and Rash    Consultations:  Discussed case with General Surgery  Procedures/Studies: DG Chest Port 1 View  Result Date: 02/20/2019 CLINICAL DATA:  Cough with nausea and vomiting EXAM: PORTABLE CHEST 1 VIEW COMPARISON:  December 09, 2018 FINDINGS: Lungs are clear. Heart is upper normal in size with pulmonary vascularity normal. No adenopathy. No bone lesions. IMPRESSION: Lungs clear.  Heart upper  normal in size.  No adenopathy. Electronically Signed   By: Lowella Grip III M.D.   On: 02/20/2019 09:56     Subjective: Eager to go home  Discharge Exam: Vitals:   02/22/19 0548 02/22/19 1302  BP: 127/68 136/68  Pulse: 86 93  Resp: 20 18  Temp: 98.2 F (36.8 C) 98.5 F (36.9 C)  SpO2: 96% 100%   Vitals:   02/21/19 1700 02/21/19 2031 02/22/19 0548 02/22/19 1302  BP:  129/66 127/68 136/68  Pulse:  86 86 93  Resp:  20 20 18   Temp: 100.1 F (37.8 C) 99 F (37.2 C) 98.2 F (36.8 C) 98.5 F (36.9 C)  TempSrc:  Oral Oral Oral  SpO2:  100% 96% 100%  Weight:      Height:        General: Pt is alert, awake, not in acute distress Cardiovascular: RRR, S1/S2 +, no rubs, no gallops Respiratory: CTA bilaterally, no wheezing, no rhonchi Abdominal: Soft, NT, ND, bowel sounds + Extremities: no edema, no cyanosis   The results of significant diagnostics from this hospitalization (including imaging, microbiology, ancillary and laboratory) are listed below for reference.     Microbiology: Recent Results (from the past 240 hour(s))  Respiratory Panel by RT PCR (Flu A&B, Covid) - Nasopharyngeal Swab     Status: None   Collection Time: 02/20/19  9:31 AM   Specimen: Nasopharyngeal Swab  Result Value Ref Range Status   SARS Coronavirus 2 by RT PCR NEGATIVE NEGATIVE  Final    Comment: (NOTE) SARS-CoV-2 target nucleic acids are NOT DETECTED. The SARS-CoV-2 RNA is generally detectable in upper respiratoy specimens during the acute phase of infection. The lowest concentration of SARS-CoV-2 viral copies this assay can detect is 131 copies/mL. A negative result does not preclude SARS-Cov-2 infection and should not be used as the sole basis for treatment or other patient management decisions. A negative result may occur with  improper specimen collection/handling, submission of specimen other than nasopharyngeal swab, presence of viral mutation(s) within the areas targeted by this  assay, and inadequate number of viral copies (<131 copies/mL). A negative result must be combined with clinical observations, patient history, and epidemiological information. The expected result is Negative. Fact Sheet for Patients:  PinkCheek.be Fact Sheet for Healthcare Providers:  GravelBags.it This test is not yet ap proved or cleared by the [place] FDA and  has been authorized for detection and/or diagnosis of SARS-CoV-2 by FDA under an Emergency Use Authorization (EUA). This EUA will remain  in effect (meaning this test can be used) for the duration of the COVID-19 declaration under Section 564(b)(1) of the Act, 21 U.S.C. section 360bbb-3(b)(1), unless the authorization is terminated or revoked sooner.    Influenza A by PCR NEGATIVE NEGATIVE Final   Influenza B by PCR NEGATIVE NEGATIVE Final    Comment: (NOTE) The Xpert Xpress SARS-CoV-2/FLU/RSV assay is intended as an aid in  the diagnosis of influenza from Nasopharyngeal swab specimens and  should not be used as a sole basis for treatment. Nasal washings and  aspirates are unacceptable for Xpert Xpress SARS-CoV-2/FLU/RSV  testing. Fact Sheet for Patients: PinkCheek.be Fact Sheet for Healthcare Providers: GravelBags.it This test is not yet approved or cleared by the [place] FDA and  has been authorized for detection and/or diagnosis of SARS-CoV-2 by  FDA under an Emergency Use Authorization (EUA). This EUA will remain  in effect (meaning this test can be used) for the duration of the  Covid-19 declaration under Section 564(b)(1) of the Act, 21  U.S.C. section 360bbb-3(b)(1), unless the authorization is  terminated or revoked. Performed at [facility], [place] [address], [place], [place] [address]   Respiratory Panel by RT PCR (Flu A&B, Covid) - Nasopharyngeal Swab     Status: None    Collection Time: 02/21/19 10:00 PM   Specimen: Nasopharyngeal Swab  Result Value Ref Range Status   SARS Coronavirus 2 by RT PCR NEGATIVE NEGATIVE Final    Comment: (NOTE) SARS-CoV-2 target nucleic acids are NOT DETECTED. The SARS-CoV-2 RNA is generally detectable in upper respiratoy specimens during the acute phase of infection. The lowest concentration of SARS-CoV-2 viral copies this assay can detect is 131 copies/mL. A negative result does not preclude SARS-Cov-2 infection and should not be used as the sole basis for treatment or other patient management decisions. A negative result may occur with  improper specimen collection/handling, submission of specimen other than nasopharyngeal swab, presence of viral mutation(s) within the areas targeted by this assay, and inadequate number of viral copies (<131 copies/mL). A negative result must be combined with clinical observations, patient history, and epidemiological information. The expected result is Negative. Fact Sheet for Patients:  PinkCheek.be Fact Sheet for Healthcare Providers:  GravelBags.it This test is not yet ap proved or cleared by the [place] FDA and  has been authorized for detection and/or diagnosis of SARS-CoV-2 by FDA under an Emergency Use Authorization (EUA). This EUA will remain  in effect (meaning this test can  be used) for the duration of the COVID-19 declaration under Section 564(b)(1) of the Act, 21 U.S.C. section 360bbb-3(b)(1), unless the authorization is terminated or revoked sooner.    Influenza A by PCR NEGATIVE NEGATIVE Final   Influenza B by PCR NEGATIVE NEGATIVE Final    Comment: (NOTE) The Xpert Xpress SARS-CoV-2/FLU/RSV assay is intended as an aid in  the diagnosis of influenza from Nasopharyngeal swab specimens and  should not be used as a sole basis for treatment. Nasal washings and  aspirates are unacceptable for Xpert Xpress  SARS-CoV-2/FLU/RSV  testing. Fact Sheet for Patients: PinkCheek.be Fact Sheet for Healthcare Providers: GravelBags.it This test is not yet approved or cleared by the [place] FDA and  has been authorized for detection and/or diagnosis of SARS-CoV-2 by  FDA under an Emergency Use Authorization (EUA). This EUA will remain  in effect (meaning this test can be used) for the duration of the  Covid-19 declaration under Section 564(b)(1) of the Act, 21  U.S.C. section 360bbb-3(b)(1), unless the authorization is  terminated or revoked. Performed at [facility], [place] [address], [place], [place] [address]      Labs: BNP (last 3 results) Recent Labs    10/16/18 1042  BNP 191.4*   Basic Metabolic Panel: Recent Labs  Lab 02/20/19 1005 02/21/19 0418 02/22/19 0220  NA 137 142 139  K 2.2* 3.0* 3.3*  CL 110 119* 118*  CO2 18* 17* 15*  GLUCOSE 87 97 103*  BUN 11 10 8   CREATININE 1.39* 1.58* 1.52*  CALCIUM 8.4* 8.4* 8.4*  MG 1.8  --   --   PHOS 1.2*  --   --    Liver Function Tests: Recent Labs  Lab 02/20/19 1005 02/21/19 0418 02/22/19 0220  AST 46* 44* 48*  ALT 26 27 28   ALKPHOS 148* 146* 164*  BILITOT 4.1* 3.9* 4.4*  PROT 5.1* 5.2* 5.6*  ALBUMIN 2.4* 2.3* 2.5*   Recent Labs  Lab 02/20/19 1005  LIPASE 51   No results for input(s): AMMONIA in the last 168 hours. CBC: Recent Labs  Lab 02/20/19 0906 02/21/19 0418 02/22/19 0220  WBC 8.6 7.8 13.4*  NEUTROABS 6.6  --   --   HGB 8.0* 7.7* 8.5*  HCT 25.3* 25.0* 27.6*  MCV 91.7 94.0 94.5  PLT 80* 73* 85*   Cardiac Enzymes: No results for input(s): CKTOTAL, CKMB, CKMBINDEX, TROPONINI in the last 168 hours. BNP: Invalid input(s): POCBNP CBG: No results for input(s): GLUCAP in the last 168 hours. D-Dimer Recent Labs    02/21/19 1933  DDIMER 1.37*   Hgb A1c No results for input(s): HGBA1C in the last 72 hours. Lipid Profile No  results for input(s): CHOL, HDL, LDLCALC, TRIG, CHOLHDL, LDLDIRECT in the last 72 hours. Thyroid function studies No results for input(s): TSH, T4TOTAL, T3FREE, THYROIDAB in the last 72 hours.  Invalid input(s): FREET3 Anemia work up Recent Labs    02/21/19 1933  FERRITIN 24   Urinalysis    Component Value Date/Time   COLORURINE YELLOW 02/20/2019 1120   APPEARANCEUR HAZY (A) 02/20/2019 1120   LABSPEC 1.005 02/20/2019 1120   PHURINE 7.0 02/20/2019 1120   GLUCOSEU 150 (A) 02/20/2019 1120   HGBUR SMALL (A) 02/20/2019 1120   BILIRUBINUR NEGATIVE 02/20/2019 1120   KETONESUR NEGATIVE 02/20/2019 1120   PROTEINUR NEGATIVE 02/20/2019 1120   NITRITE NEGATIVE 02/20/2019 1120   LEUKOCYTESUR TRACE (A) 02/20/2019 1120   Sepsis Labs Invalid input(s): PROCALCITONIN,  WBC,  LACTICIDVEN Microbiology Recent  Results (from the past 240 hour(s))  Respiratory Panel by RT PCR (Flu A&B, Covid) - Nasopharyngeal Swab     Status: None   Collection Time: 02/20/19  9:31 AM   Specimen: Nasopharyngeal Swab  Result Value Ref Range Status   SARS Coronavirus 2 by RT PCR NEGATIVE NEGATIVE Final    Comment: (NOTE) SARS-CoV-2 target nucleic acids are NOT DETECTED. The SARS-CoV-2 RNA is generally detectable in upper respiratoy specimens during the acute phase of infection. The lowest concentration of SARS-CoV-2 viral copies this assay can detect is 131 copies/mL. A negative result does not preclude SARS-Cov-2 infection and should not be used as the sole basis for treatment or other patient management decisions. A negative result may occur with  improper specimen collection/handling, submission of specimen other than nasopharyngeal swab, presence of viral mutation(s) within the areas targeted by this assay, and inadequate number of viral copies (<131 copies/mL). A negative result must be combined with clinical observations, patient history, and epidemiological information. The expected result is  Negative. Fact Sheet for Patients:  PinkCheek.be Fact Sheet for Healthcare Providers:  GravelBags.it This test is not yet ap proved or cleared by the [place] FDA and  has been authorized for detection and/or diagnosis of SARS-CoV-2 by FDA under an Emergency Use Authorization (EUA). This EUA will remain  in effect (meaning this test can be used) for the duration of the COVID-19 declaration under Section 564(b)(1) of the Act, 21 U.S.C. section 360bbb-3(b)(1), unless the authorization is terminated or revoked sooner.    Influenza A by PCR NEGATIVE NEGATIVE Final   Influenza B by PCR NEGATIVE NEGATIVE Final    Comment: (NOTE) The Xpert Xpress SARS-CoV-2/FLU/RSV assay is intended as an aid in  the diagnosis of influenza from Nasopharyngeal swab specimens and  should not be used as a sole basis for treatment. Nasal washings and  aspirates are unacceptable for Xpert Xpress SARS-CoV-2/FLU/RSV  testing. Fact Sheet for Patients: PinkCheek.be Fact Sheet for Healthcare Providers: GravelBags.it This test is not yet approved or cleared by the [place] FDA and  has been authorized for detection and/or diagnosis of SARS-CoV-2 by  FDA under an Emergency Use Authorization (EUA). This EUA will remain  in effect (meaning this test can be used) for the duration of the  Covid-19 declaration under Section 564(b)(1) of the Act, 21  U.S.C. section 360bbb-3(b)(1), unless the authorization is  terminated or revoked. Performed at [facility], [place] [address], [place], [place] [address]   Respiratory Panel by RT PCR (Flu A&B, Covid) - Nasopharyngeal Swab     Status: None   Collection Time: 02/21/19 10:00 PM   Specimen: Nasopharyngeal Swab  Result Value Ref Range Status   SARS Coronavirus 2 by RT PCR NEGATIVE NEGATIVE Final    Comment: (NOTE) SARS-CoV-2 target  nucleic acids are NOT DETECTED. The SARS-CoV-2 RNA is generally detectable in upper respiratoy specimens during the acute phase of infection. The lowest concentration of SARS-CoV-2 viral copies this assay can detect is 131 copies/mL. A negative result does not preclude SARS-Cov-2 infection and should not be used as the sole basis for treatment or other patient management decisions. A negative result may occur with  improper specimen collection/handling, submission of specimen other than nasopharyngeal swab, presence of viral mutation(s) within the areas targeted by this assay, and inadequate number of viral copies (<131 copies/mL). A negative result must be combined with clinical observations, patient history, and epidemiological information. The expected result is Negative. Fact Sheet for Patients:  PinkCheek.be Fact Sheet for Healthcare Providers:  GravelBags.it This test is not yet ap proved or cleared by the [place] FDA and  has been authorized for detection and/or diagnosis of SARS-CoV-2 by FDA under an Emergency Use Authorization (EUA). This EUA will remain  in effect (meaning this test can be used) for the duration of the COVID-19 declaration under Section 564(b)(1) of the Act, 21 U.S.C. section 360bbb-3(b)(1), unless the authorization is terminated or revoked sooner.    Influenza A by PCR NEGATIVE NEGATIVE Final   Influenza B by PCR NEGATIVE NEGATIVE Final    Comment: (NOTE) The Xpert Xpress SARS-CoV-2/FLU/RSV assay is intended as an aid in  the diagnosis of influenza from Nasopharyngeal swab specimens and  should not be used as a sole basis for treatment. Nasal washings and  aspirates are unacceptable for Xpert Xpress SARS-CoV-2/FLU/RSV  testing. Fact Sheet for Patients: PinkCheek.be Fact Sheet for Healthcare Providers: GravelBags.it This test is not  yet approved or cleared by the [place] FDA and  has been authorized for detection and/or diagnosis of SARS-CoV-2 by  FDA under an Emergency Use Authorization (EUA). This EUA will remain  in effect (meaning this test can be used) for the duration of the  Covid-19 declaration under Section 564(b)(1) of the Act, 21  U.S.C. section 360bbb-3(b)(1), unless the authorization is  terminated or revoked. Performed at [facility], [place] [address], [place], [place] [address]    Time spent: 30 min  SIGNED:   Marylu Lund, MD  [facility] 02/22/2019, 2:48 PM  If 7PM-7AM, please contact night-coverage

## 2019-02-22 NOTE — Plan of Care (Signed)

## 2019-02-27 LAB — CULTURE, BLOOD (ROUTINE X 2)
Culture: NO GROWTH
Culture: NO GROWTH
Special Requests: ADEQUATE
Special Requests: ADEQUATE

## 2019-02-28 LAB — HEAVY METALS PROFILE, URINE
Arsenic (Total),U: 16 ug/L (ref 0–50)
Arsenic(Inorganic),U: NOT DETECTED ug/L (ref 0–19)
Creatinine(Crt),U: 0.59 g/L (ref 0.30–3.00)
Lead, Rand Ur: NOT DETECTED ug/L (ref 0–49)
Mercury, Ur: NOT DETECTED ug/L (ref 0–19)

## 2019-03-27 ENCOUNTER — Telehealth: Payer: Self-pay | Admitting: Hematology and Oncology

## 2019-03-27 NOTE — Telephone Encounter (Signed)
Received a new hem referral from Dr. Harrington Challenger for chronic anemia. Pt wanted to establish care in [place] instead of having to go to [place]. She's been scheduled to see Dr. Lindi Adie on 4/5 at 1pm. Aware to arrive 15 minutes early.

## 2019-04-15 NOTE — Progress Notes (Addendum)
[facility] CONSULT NOTE  Patient Care Team: Lawerance Cruel, MD as PCP - General (Family Medicine) [name], MD as PCP - Cardiology (Cardiology) Stephens Shire, MD as Referring Physician (Family Medicine)  CHIEF COMPLAINTS/PURPOSE OF CONSULTATION:  History of chronic anemia and thrombocytopenia  HISTORY OF PRESENTING ILLNESS:  Dana Watkins 64 y.o. female is here because of a history of chronic anemia and thrombocytopenia. She presented to the ED on 02/22/19 for 6 days of nausea and vomiting. Labs showed Hg 8.0, platelets 80. She presents to the clinic today for initial evaluation.  She continues to have nausea issues yesterday she thought of going to the emergency room.  She was referred to [place] for evaluation of her acute on chronic anemia as well as stage III chronic kidney disease related anemia.  She tells me that she feels cold and generalized weakness.  I reviewed her records extensively and collaborated the history with the patient.  MEDICAL HISTORY:  Past Medical History:  Diagnosis Date  . Acute hypokalemia 11/26/2014  . Arthritis 05/15/2015  . Benign essential hypertension 05/15/2015  . C. difficile colitis   . CAD (coronary artery disease)    a. cath 04/2017: ""Diffuse, calcific CAD particularly in the distal RCA and proximal to mid LAD.  LAD disease is nonobstructive.  RCA disease is more severe but does not appear significant.  Given her lack of symptoms, would pursue medical therapy."  . CKD (chronic kidney disease), stage III   . Hypokalemia   . Migraine headache 05/15/2015  . Mild mitral regurgitation   . Pancytopenia ([place])   . Sepsis ([place]) 01/2017  . Thrombocytopenia ([place])    a. chronic thrombocytopenia (ITP - remotely saw hematology).  . Vitamin D deficiency     SURGICAL HISTORY: Past Surgical History:  Procedure Laterality Date  . CESAREAN SECTION     24 years ago  . LEFT HEART CATH AND CORONARY ANGIOGRAPHY N/A 05/12/2017   Procedure: LEFT HEART CATH AND CORONARY ANGIOGRAPHY;  Surgeon: [name] Booze, MD;  Location: [facility];  Service: Cardiovascular;  Laterality: N/A;  . tonsils and adneoids     as a child  . ULTRASOUND GUIDANCE FOR VASCULAR ACCESS  05/12/2017   Procedure: Ultrasound Guidance For Vascular Access;  Surgeon: [name] Booze, MD;  Location: [facility];  Service: Cardiovascular;;    SOCIAL HISTORY: Social History   Socioeconomic History  . Marital status: Single    Spouse name: Not on file  . Number of children: Not on file  . Years of education: Not on file  . Highest education level: Not on file  Occupational History  . Not on file  Tobacco Use  . Smoking status: Former Smoker    Years: 2.00    Start date: 06/25/1968    Quit date: 05/16/2014    Years since quitting: 4.9  . Smokeless tobacco: Never Used  Substance and Sexual Activity  . Alcohol use: Yes    Alcohol/week: 1.0 standard drinks    Types: 1 Glasses of wine per week    Comment: occ  . Drug use: No  . Sexual activity: Not on file  Other Topics Concern  . Not on file  Social History Narrative   Lives with husband  In home.  Has seasonal allergies.  Drinks coffee, 1 cup AM and 3 plus sweet tea.  Has 3 grown kids.     Social Determinants of Health   Financial Resource Strain:   .  Difficulty of Paying Living Expenses:   Food Insecurity:   . Worried About Charity fundraiser in the Last Year:   . Arboriculturist in the Last Year:   Transportation Needs:   . Film/video editor (Medical):   Marland Kitchen Lack of Transportation (Non-Medical):   Physical Activity:   . Days of Exercise per Week:   . Minutes of Exercise per Session:   Stress:   . Feeling of Stress :   Social Connections:   . Frequency of Communication with Friends and Family:   . Frequency of Social Gatherings with Friends and Family:   . Attends Religious Services:   . Active Member of Clubs or Organizations:   . Attends Theatre manager Meetings:   Marland Kitchen Marital Status:   Intimate Partner Violence:   . Fear of Current or Ex-Partner:   . Emotionally Abused:   Marland Kitchen Physically Abused:   . Sexually Abused:     FAMILY HISTORY: Family History  Problem Relation Age of Onset  . Pernicious anemia Maternal Grandfather   . Heart attack Maternal Grandfather   . Atrial fibrillation Mother   . Supraventricular tachycardia Father   . Heart disease Paternal Grandfather     ALLERGIES:  is allergic to sulfa antibiotics.  MEDICATIONS:  Current Outpatient Medications  Medication Sig Dispense Refill  . acetaminophen (TYLENOL) 500 MG tablet Take 500 mg by mouth every 6 (six) hours as needed (For pain.).    Marland Kitchen Butalbital-APAP-Caffeine 50-325-40 MG capsule Take 1 capsule by mouth every 6 (six) hours as needed for pain or headache.     . Cholecalciferol (VITAMIN D3) 2000 units TABS Take 2,000 Units by mouth daily.     . folic acid (FOLVITE) 1 MG tablet Take 1 tablet (1 mg total) by mouth daily. 30 tablet 0  . MAGNESIUM PO Take 1 tablet by mouth daily.     . Multiple Vitamin (MULTIVITAMIN WITH MINERALS) TABS tablet Take 1 tablet by mouth daily. 30 tablet 0  . ondansetron (ZOFRAN-ODT) 4 MG disintegrating tablet Take 1 tablet (4 mg total) by mouth every 8 (eight) hours as needed for nausea or vomiting. 20 tablet 0  . thiamine 100 MG tablet Take 1 tablet (100 mg total) by mouth daily. 30 tablet 0   No current facility-administered medications for this visit.    REVIEW OF SYSTEMS:   Constitutional: Denies fevers, chills or abnormal night sweats Eyes: Denies blurriness of vision, double vision or watery eyes Ears, nose, mouth, throat, and face: Jaundice Respiratory: Denies cough, dyspnea or wheezes Cardiovascular: Denies palpitation, chest discomfort or lower extremity swelling Gastrointestinal:  Denies nausea, heartburn or change in bowel habits Skin: Jaundice Lymphatics: Denies new lymphadenopathy or easy  bruising Neurological:Denies numbness, tingling or new weaknesses Behavioral/Psych: Mood is stable, no new changes  All other systems were reviewed with the patient and are negative.  PHYSICAL EXAMINATION: ECOG PERFORMANCE STATUS: 1 - Symptomatic but completely ambulatory  Vitals:   04/16/19 1349  BP: 138/68  Pulse: 73  Resp: 18  Temp: 98.3 F (36.8 C)  SpO2: 100%   Filed Weights   04/16/19 1349  Weight: 139 lb 4.8 oz (63.2 kg)    GENERAL:alert, no distress and comfortable SKIN: skin color, texture, turgor are normal, no rashes or significant lesions EYES: Jaundice OROPHARYNX:no exudate, no erythema and lips, buccal mucosa, and tongue normal  NECK: supple, thyroid normal size, non-tender, without nodularity LYMPH:  no palpable lymphadenopathy in the cervical, axillary or inguinal LUNGS:  clear to auscultation and percussion with normal breathing effort HEART: regular rate & rhythm and no murmurs and no lower extremity edema ABDOMEN:abdomen soft, non-tender and normal bowel sounds Musculoskeletal:no cyanosis of digits and no clubbing  PSYCH: alert & oriented x 3 with fluent speech NEURO: Slight unsteadiness in the gait  LABORATORY DATA:  I have reviewed the data as listed Lab Results  Component Value Date   WBC 4.9 04/16/2019   HGB 8.1 (L) 04/16/2019   HCT 26.1 (L) 04/16/2019   MCV 96.3 04/16/2019   PLT 57 (L) 04/16/2019   Lab Results  Component Value Date   NA 139 02/22/2019   K 3.3 (L) 02/22/2019   CL 118 (H) 02/22/2019   CO2 15 (L) 02/22/2019    RADIOGRAPHIC STUDIES: I have personally reviewed the radiological reports and agreed with the findings in the report.  ASSESSMENT AND PLAN:  Thrombocytopenia ([place]) Secondary to cirrhosis of the liver which is secondary to alcoholism/ NASH. I discussed with the patient that her platelet counts are at her baseline level which is anywhere between 60-80.  Today's platelet count is 57 Unless there are active symptoms of  bleeding there is no indication for platelet transfusion. We also discussed that the platelets may fluctuate up and down. If she experiences any nosebleeding or gum bleeding or vaginal or GI bleeding, then she needs to inform us ASAP.  Anemia due to stage 3 chronic kidney disease Chronic normocytic anemia: GFR 36 Secondary to chronic kidney disease as well as secondary to cirrhosis of the liver. We will obtain erythropoietin level along with reticulocyte count along with repeat of the complete iron panel  We will do type and cross in case she needs blood transfusion.  Ferritin 02/20/2018: 24 Repeat iron studies are pending. There is no indication for bone marrow biopsy at this time.  We will request nephrology consultation. Return to clinic in 1 week to go over the results of this test.  All questions were answered. The patient knows to call the clinic with any problems, questions or concerns.    Harriette Ohara, MD @T @   Addendum: Given worsening jaundice and the slight unsteadiness of the gait, and her GI symptoms of nausea, I recommended that she go to the emergency room.

## 2019-04-16 ENCOUNTER — Encounter (HOSPITAL_COMMUNITY): Payer: Self-pay

## 2019-04-16 ENCOUNTER — Inpatient Hospital Stay: Payer: Medicare (Managed Care) | Attending: Hematology and Oncology | Admitting: Hematology and Oncology

## 2019-04-16 ENCOUNTER — Emergency Department (HOSPITAL_COMMUNITY): Payer: Medicare (Managed Care)

## 2019-04-16 ENCOUNTER — Emergency Department (HOSPITAL_COMMUNITY)
Admission: EM | Admit: 2019-04-16 | Discharge: 2019-04-16 | Disposition: A | Discharge status: Home / Self Care | Payer: Medicare (Managed Care) | Attending: Emergency Medicine | Admitting: Emergency Medicine

## 2019-04-16 ENCOUNTER — Other Ambulatory Visit: Payer: Self-pay

## 2019-04-16 ENCOUNTER — Telehealth: Payer: Self-pay | Admitting: Hematology and Oncology

## 2019-04-16 ENCOUNTER — Inpatient Hospital Stay: Payer: Medicare (Managed Care)

## 2019-04-16 VITALS — BP 138/68 | HR 73 | Temp 98.3°F | Resp 18 | Ht 68.0 in | Wt 139.3 lb

## 2019-04-16 DIAGNOSIS — R110 Nausea: Secondary | ICD-10-CM

## 2019-04-16 DIAGNOSIS — Z79899 Other long term (current) drug therapy: Secondary | ICD-10-CM | POA: Insufficient documentation

## 2019-04-16 DIAGNOSIS — Z87891 Personal history of nicotine dependence: Secondary | ICD-10-CM | POA: Insufficient documentation

## 2019-04-16 DIAGNOSIS — D638 Anemia in other chronic diseases classified elsewhere: Secondary | ICD-10-CM

## 2019-04-16 DIAGNOSIS — D696 Thrombocytopenia, unspecified: Secondary | ICD-10-CM | POA: Diagnosis not present

## 2019-04-16 DIAGNOSIS — F17210 Nicotine dependence, cigarettes, uncomplicated: Secondary | ICD-10-CM | POA: Insufficient documentation

## 2019-04-16 DIAGNOSIS — R269 Unspecified abnormalities of gait and mobility: Secondary | ICD-10-CM | POA: Insufficient documentation

## 2019-04-16 DIAGNOSIS — Z20822 Contact with and (suspected) exposure to covid-19: Secondary | ICD-10-CM | POA: Insufficient documentation

## 2019-04-16 DIAGNOSIS — R112 Nausea with vomiting, unspecified: Secondary | ICD-10-CM | POA: Insufficient documentation

## 2019-04-16 DIAGNOSIS — N183 Chronic kidney disease, stage 3 unspecified: Secondary | ICD-10-CM

## 2019-04-16 DIAGNOSIS — Z7289 Other problems related to lifestyle: Secondary | ICD-10-CM | POA: Insufficient documentation

## 2019-04-16 DIAGNOSIS — I129 Hypertensive chronic kidney disease with stage 1 through stage 4 chronic kidney disease, or unspecified chronic kidney disease: Secondary | ICD-10-CM | POA: Diagnosis not present

## 2019-04-16 DIAGNOSIS — D631 Anemia in chronic kidney disease: Secondary | ICD-10-CM

## 2019-04-16 DIAGNOSIS — E876 Hypokalemia: Secondary | ICD-10-CM | POA: Insufficient documentation

## 2019-04-16 DIAGNOSIS — R17 Unspecified jaundice: Secondary | ICD-10-CM

## 2019-04-16 DIAGNOSIS — I2510 Atherosclerotic heart disease of native coronary artery without angina pectoris: Secondary | ICD-10-CM | POA: Diagnosis not present

## 2019-04-16 DIAGNOSIS — Z832 Family history of diseases of the blood and blood-forming organs and certain disorders involving the immune mechanism: Secondary | ICD-10-CM | POA: Insufficient documentation

## 2019-04-16 DIAGNOSIS — Z882 Allergy status to sulfonamides status: Secondary | ICD-10-CM | POA: Insufficient documentation

## 2019-04-16 DIAGNOSIS — K7581 Nonalcoholic steatohepatitis (NASH): Secondary | ICD-10-CM | POA: Insufficient documentation

## 2019-04-16 DIAGNOSIS — D61818 Other pancytopenia: Secondary | ICD-10-CM

## 2019-04-16 DIAGNOSIS — Z8249 Family history of ischemic heart disease and other diseases of the circulatory system: Secondary | ICD-10-CM | POA: Insufficient documentation

## 2019-04-16 DIAGNOSIS — R531 Weakness: Secondary | ICD-10-CM | POA: Insufficient documentation

## 2019-04-16 DIAGNOSIS — K7030 Alcoholic cirrhosis of liver without ascites: Secondary | ICD-10-CM | POA: Insufficient documentation

## 2019-04-16 LAB — CBC WITH DIFFERENTIAL (CANCER CENTER ONLY)
Abs Immature Granulocytes: 0.01 10*3/uL (ref 0.00–0.07)
Basophils Absolute: 0 10*3/uL (ref 0.0–0.1)
Basophils Relative: 0 %
Eosinophils Absolute: 0.1 10*3/uL (ref 0.0–0.5)
Eosinophils Relative: 2 %
HCT: 26.1 % — ABNORMAL LOW (ref 36.0–46.0)
Hemoglobin: 8.1 g/dL — ABNORMAL LOW (ref 12.0–15.0)
Immature Granulocytes: 0 %
Lymphocytes Relative: 23 %
Lymphs Abs: 1.1 10*3/uL (ref 0.7–4.0)
MCH: 29.9 pg (ref 26.0–34.0)
MCHC: 31 g/dL (ref 30.0–36.0)
MCV: 96.3 fL (ref 80.0–100.0)
Monocytes Absolute: 0.6 10*3/uL (ref 0.1–1.0)
Monocytes Relative: 12 %
Neutro Abs: 3 10*3/uL (ref 1.7–7.7)
Neutrophils Relative %: 63 %
Platelet Count: 57 10*3/uL — ABNORMAL LOW (ref 150–400)
RBC: 2.71 MIL/uL — ABNORMAL LOW (ref 3.87–5.11)
RDW: 17.2 % — ABNORMAL HIGH (ref 11.5–15.5)
WBC Count: 4.9 10*3/uL (ref 4.0–10.5)
nRBC: 0 % (ref 0.0–0.2)

## 2019-04-16 LAB — SARS CORONAVIRUS 2 (TAT 6-24 HRS): SARS Coronavirus 2: NEGATIVE

## 2019-04-16 LAB — IRON AND TIBC
Iron: 164 ug/dL — ABNORMAL HIGH (ref 41–142)
Saturation Ratios: 51 % (ref 21–57)
TIBC: 320 ug/dL (ref 236–444)
UIBC: 156 ug/dL (ref 120–384)

## 2019-04-16 LAB — CMP (CANCER CENTER ONLY)
ALT: 38 U/L (ref 0–44)
AST: 69 U/L — ABNORMAL HIGH (ref 15–41)
Albumin: 3.1 g/dL — ABNORMAL LOW (ref 3.5–5.0)
Alkaline Phosphatase: 209 U/L — ABNORMAL HIGH (ref 38–126)
Anion gap: 9 (ref 5–15)
BUN: 12 mg/dL (ref 8–23)
CO2: 16 mmol/L — ABNORMAL LOW (ref 22–32)
Calcium: 9.6 mg/dL (ref 8.9–10.3)
Chloride: 117 mmol/L — ABNORMAL HIGH (ref 98–111)
Creatinine: 1.3 mg/dL — ABNORMAL HIGH (ref 0.44–1.00)
GFR, Est AFR Am: 51 mL/min — ABNORMAL LOW (ref >60)
GFR, Estimated: 44 mL/min — ABNORMAL LOW (ref >60)
Glucose, Bld: 98 mg/dL (ref 70–99)
Potassium: 3.2 mmol/L — ABNORMAL LOW (ref 3.5–5.1)
Sodium: 142 mmol/L (ref 135–145)
Total Bilirubin: 6.1 mg/dL (ref 0.3–1.2)
Total Protein: 6.6 g/dL (ref 6.5–8.1)

## 2019-04-16 LAB — FOLATE: Folate: 13.4 ng/mL (ref >5.9)

## 2019-04-16 LAB — SAMPLE TO BLOOD BANK

## 2019-04-16 LAB — RETIC PANEL
Immature Retic Fract: 14.1 % (ref 2.3–15.9)
RBC.: 2.69 MIL/uL — ABNORMAL LOW (ref 3.87–5.11)
Retic Count, Absolute: 57.8 10*3/uL (ref 19.0–186.0)
Retic Ct Pct: 2.2 % (ref 0.4–3.1)
Reticulocyte Hemoglobin: 31.9 pg (ref >27.9)

## 2019-04-16 LAB — DIRECT ANTIGLOBULIN TEST (NOT AT ARMC)
DAT, IgG: NEGATIVE
DAT, complement: NEGATIVE

## 2019-04-16 LAB — VITAMIN B12: Vitamin B-12: 1087 pg/mL — ABNORMAL HIGH (ref 180–914)

## 2019-04-16 LAB — LACTATE DEHYDROGENASE: LDH: 167 U/L (ref 98–192)

## 2019-04-16 LAB — FERRITIN: Ferritin: 44 ng/mL (ref 11–307)

## 2019-04-16 MED ORDER — ONDANSETRON 4 MG PO TBDP: 4.0000 mg | ORAL_TABLET | Freq: Three times a day (TID) | ORAL | 0 refills | Status: DC | PRN

## 2019-04-16 MED ORDER — ONDANSETRON HCL 4 MG/2ML IJ SOLN
4.0000 mg | Freq: Once | INTRAMUSCULAR | Status: AC
  Administered 2019-04-16: 4 mg via INTRAVENOUS
  Filled 2019-04-16: qty 2

## 2019-04-16 NOTE — Assessment & Plan Note (Signed)
Secondary to cirrhosis of the liver which is secondary to alcoholism. I discussed with the patient that her platelet counts are at her baseline level which is anywhere between 60-80. Unless there are active symptoms of bleeding there is no indication for platelet transfusion. We also discussed that the platelets may fluctuate up and down. If she experiences any nosebleeding or gum bleeding or vaginal or GI bleeding, then she needs to inform us ASAP.

## 2019-04-16 NOTE — Assessment & Plan Note (Signed)
Chronic normocytic anemia: GFR 36 Secondary to chronic kidney disease as well as secondary to cirrhosis of the liver. We will obtain erythropoietin level along with reticulocyte count along with repeat of the complete iron panel. Ferritin 02/20/2018: 24 It does appear that the patient might be likely iron deficient. We can try to replenish iron stores and if it does not respond then treat the patient with erythropoietin stimulating agents.  There is no indication for bone marrow biopsy at this time.  

## 2019-04-16 NOTE — ED Triage Notes (Addendum)
EMS reports from home, seen today for lab work, MD advised to return to ED due to abnormal labs. Stage 3 kidney disease. Pt c/o nausea and vomiting. Had negative Covid test and has received vaccine.  Today's Lab results in chart  BP 130./64 HR 78 RR 24 Sp02 99 RA CBG 131

## 2019-04-16 NOTE — ED Provider Notes (Signed)
[facility] DEPT Provider Note   CSN: [id number] Arrival date & time: 04/16/19  1627     History Chief Complaint  Patient presents with  . Abnormal Labs  . Nausea    Dana Watkins is a 64 y.o. female.  She has chronic pancytopenia and thrombocytopenia along with CKD and some element of cirrhosis.  She saw her heme-onc doctor today and he recommended that she come to the emergency department for elevated bilirubin.  She is complaining of intractable nausea and vomiting starting yesterday.  She also feels short of breath.  No fevers chills chest pain abdominal pain diarrhea constipation or urinary symptoms.  The history is provided by the patient.  Emesis Severity:  Moderate Duration:  2 weeks Timing:  Intermittent Quality:  Stomach contents Progression:  Unchanged Chronicity:  New Recent urination:  Normal Relieved by:  Nothing Worsened by:  Nothing Ineffective treatments:  None tried Associated symptoms: no abdominal pain, no cough, no diarrhea, no fever, no headaches, no myalgias and no sore throat        Past Medical History:  Diagnosis Date  . Acute hypokalemia 11/26/2014  . Arthritis 05/15/2015  . Benign essential hypertension 05/15/2015  . C. difficile colitis   . CAD (coronary artery disease)    a. cath 04/2017: ""Diffuse, calcific CAD particularly in the distal RCA and proximal to mid LAD.  LAD disease is nonobstructive.  RCA disease is more severe but does not appear significant.  Given her lack of symptoms, would pursue medical therapy."  . CKD (chronic kidney disease), stage III   . Hypokalemia   . Migraine headache 05/15/2015  . Mild mitral regurgitation   . Pancytopenia ([place])   . Sepsis ([place]) 01/2017  . Thrombocytopenia ([place])    a. chronic thrombocytopenia (ITP - remotely saw hematology).  . Vitamin D deficiency     Patient Active Problem List   Diagnosis Date Noted  . Anemia due to stage 3 chronic kidney disease 04/16/2019    . Fever 02/21/2019  . Hypophosphatemia   . Recurrent Clostridioides difficile diarrhea 12/11/2018  . Weakness 12/09/2018  . Hyperbilirubinemia 10/31/2018  . C. difficile diarrhea 10/31/2018  . Other cirrhosis of liver ([place]) 10/31/2018  . Systolic murmur at cardiac apex 10/31/2018  . Sepsis ([place]) 10/30/2018  . Normocytic anemia 10/23/2018  . SIRS (systemic inflammatory response syndrome) ([place]) 10/16/2018  . Acute renal failure superimposed on stage 3 chronic kidney disease ([place]) 10/16/2018  . Acute lower UTI 10/16/2018  . Elevated troponin 10/16/2018  . Elevated d-dimer 10/16/2018  . UTI (urinary tract infection) 06/25/2018  . Community acquired pneumonia of right middle lobe of lung 06/09/2018  . AKI (acute kidney injury) ([place]) 06/09/2018  . GAD (generalized anxiety disorder) 11/06/2017  . Abnormal findings on diagnostic imaging of heart and coronary circulation   . Family history of coronary artery disease 05/09/2017  . DOE (dyspnea on exertion) 05/09/2017  . Coronary artery calcification seen on CT scan 03/08/2017  . Acute bronchitis 02/06/2017  . Dehydration   . Diarrhea 02/03/2017  . Nausea & vomiting 02/02/2017  . Arthritis 05/15/2015  . Benign essential hypertension 05/15/2015  . Migraine headache 05/15/2015  . Hypokalemia 11/26/2014  . Alkaline phosphatase elevation 11/26/2014  . Abnormal weight gain 11/26/2014  . Chronic leukopenia 05/28/2014  . Anemia of chronic illness 05/28/2014  . Thrombocytopenia ([place]) 04/30/2013  . Elevated liver function tests 04/30/2013    Past Surgical History:  Procedure Laterality Date  . CESAREAN SECTION  24 years ago  . LEFT HEART CATH AND CORONARY ANGIOGRAPHY N/A 05/12/2017   Procedure: LEFT HEART CATH AND CORONARY ANGIOGRAPHY;  Surgeon: [name] Booze, MD;  Location: [facility];  Service: Cardiovascular;  Laterality: N/A;  . tonsils and adneoids     as a child  . ULTRASOUND GUIDANCE FOR VASCULAR ACCESS  05/12/2017    Procedure: Ultrasound Guidance For Vascular Access;  Surgeon: [name] Booze, MD;  Location: [facility];  Service: Cardiovascular;;     OB History   No obstetric history on file.     Family History  Problem Relation Age of Onset  . Pernicious anemia Maternal Grandfather   . Heart attack Maternal Grandfather   . Atrial fibrillation Mother   . Supraventricular tachycardia Father   . Heart disease Paternal Grandfather     Social History   Tobacco Use  . Smoking status: Former Smoker    Years: 2.00    Start date: 06/25/1968    Quit date: 05/16/2014    Years since quitting: 4.9  . Smokeless tobacco: Never Used  Substance Use Topics  . Alcohol use: Yes    Alcohol/week: 1.0 standard drinks    Types: 1 Glasses of wine per week    Comment: occ  . Drug use: No    Home Medications Prior to Admission medications   Medication Sig Start Date End Date Taking? Authorizing Provider  acetaminophen (TYLENOL) 500 MG tablet Take 500 mg by mouth every 6 (six) hours as needed (For pain.).    [provider]  Butalbital-APAP-Caffeine [phone] MG capsule Take 1 capsule by mouth every 6 (six) hours as needed for pain or headache.  02/09/19   [provider]  Cholecalciferol (VITAMIN D3) 2000 units TABS Take 2,000 Units by mouth daily.     [provider]  folic acid (FOLVITE) 1 MG tablet Take 1 tablet (1 mg total) by mouth daily. 06/29/18   Raiford Noble Latif, DO  MAGNESIUM PO Take 1 tablet by mouth daily.     [provider]  Multiple Vitamin (MULTIVITAMIN WITH MINERALS) TABS tablet Take 1 tablet by mouth daily. 06/29/18   Sheikh, Georgina Quint Latif, DO  ondansetron (ZOFRAN-ODT) 4 MG disintegrating tablet Take 1 tablet (4 mg total) by mouth every 8 (eight) hours as needed for nausea or vomiting. 04/16/19   Nicholas Lose, MD  thiamine 100 MG tablet Take 1 tablet (100 mg total) by mouth daily. 06/29/18   Raiford Noble Latif, DO    Allergies    Sulfa  antibiotics  Review of Systems   Review of Systems  Constitutional: Negative for fever.  HENT: Negative for sore throat.   Respiratory: Positive for shortness of breath. Negative for cough.   Gastrointestinal: Positive for vomiting. Negative for abdominal pain and diarrhea.  Genitourinary: Negative for dysuria.  Musculoskeletal: Negative for myalgias.  Skin: Negative for rash.  Neurological: Negative for headaches.    Physical Exam Updated Vital Signs BP (!) 123/54   Pulse 83   Temp 98.8 F (37.1 C)   Resp 18   SpO2 100%   Physical Exam Vitals and nursing note reviewed.  Constitutional:      General: She is not in acute distress.    Appearance: She is well-developed.  HENT:     Head: Normocephalic and atraumatic.  Eyes:     General: Scleral icterus present.     Conjunctiva/sclera: Conjunctivae normal.  Cardiovascular:     Rate and Rhythm: Normal rate and  regular rhythm.     Pulses: Normal pulses.     Heart sounds: No murmur.  Pulmonary:     Effort: Pulmonary effort is normal. No respiratory distress.     Breath sounds: Normal breath sounds.  Abdominal:     Palpations: Abdomen is soft.     Tenderness: There is no abdominal tenderness. There is no guarding or rebound.  Musculoskeletal:        General: No deformity or signs of injury. Normal range of motion.     Cervical back: Neck supple.  Skin:    General: Skin is warm and dry.     Capillary Refill: Capillary refill takes less than 2 seconds.  Neurological:     General: No focal deficit present.     Mental Status: She is alert and oriented to person, place, and time.     ED Results / Procedures / Treatments   Labs (all labs ordered are listed, but only abnormal results are displayed) Labs Reviewed  SARS CORONAVIRUS 2 (TAT 6-24 HRS)    EKG EKG Interpretation  Date/Time:  Monday April 16 2019 17:34:01 EDT Ventricular Rate:  82 PR Interval:    QRS Duration: 103 QT Interval:  273 QTC  Calculation: 319 R Axis:   35 Text Interpretation: Sinus rhythm Borderline repol abnrm, anterolateral leads improved sichemic changes compared with prior 2/21 Confirmed by Aletta Edouard [phone]) on 04/16/2019 5:47:57 PM   Radiology DG Chest Port 1 View  Result Date: 04/16/2019 CLINICAL DATA:  Abnormal labs, nausea, vomiting EXAM: PORTABLE CHEST 1 VIEW COMPARISON:  02/20/2019 FINDINGS: There is no focal consolidation. There is no pleural effusion or pneumothorax. There is stable cardiomegaly. There is no acute osseous abnormality. IMPRESSION: No active disease. Electronically Signed   By: Kathreen Devoid   On: 04/16/2019 18:05    Procedures Procedures (including critical care time)  Medications Ordered in ED Medications  ondansetron (ZOFRAN) injection 4 mg (4 mg Intravenous Given 04/16/19 1746)    ED Course  I have reviewed the triage vital signs and the nursing notes.  Pertinent labs & imaging results that were available during my care of the patient were reviewed by me and considered in my medical decision making (see chart for details).  Clinical Course as of Apr 16 1109  Mon Apr 16, 2019  1732 Patient's labs done earlier today in the clinic show hemoglobin of 8.1 which is down from 8.52 months ago.  Her chemistries show her creatinine to be somewhat improved from 1.5-1.3.  Bili worsening from 4.4-6.1.  Rest of her LFTs are unchanged.   [MB]  8242 Chest x-ray interpreted by me as no gross infiltrates no pneumothorax.   [MB]  1909 Trending pulse ox remained 100%.   [MB]  2044 Patient feels well and she is comfortable for discharge.  I do not see any significant abnormalities that necessitate her being in the hospital.  She understands she needs to keep close contact with her primary care doctor will need repeat labs to make sure her numbers are worsening.   [MB]    Clinical Course User Index [MB] Hayden Rasmussen, MD   MDM Rules/Calculators/A&P                     This patient  complains of nausea and shortness of breath; this involves an extensive number of treatment Options and is a complaint that carries with it a high risk of complications and Morbidity. The differential includes pneumonia, CHF, renal  failure, metabolic derangement, anemia  I ordered, reviewed and interpreted labs, which included labs that were done earlier today in clinic including a CBC showing her hemoglobin to be slightly lower than her baseline.  Her renal function is also off but better than prior.  Her bilirubin has also risen although the rest of her LFTs are normal. I ordered medication Zofran with improvement in her nausea. I ordered imaging studies which included chest x-ray and I independently    visualized and interpreted imaging which showed no infiltrate Previous records obtained and reviewed in epic including clinic note from hematology oncology this morning.  After the interventions stated above, I reevaluated the patient and found her to be resting comfortably satting 100% on room air.  We did a trending pulse ox and her vitals continue to stay stable.  I discussed with the patient that I did not think she need to be admitted for transfusion at the current time but would need close follow-up with her doctors.  She is comfortable plan we discussed return instructions.  Final Clinical Impression(s) / ED Diagnoses Final diagnoses:  Pancytopenia ([place])  Nausea  Elevated bilirubin    Rx / DC Orders ED Discharge Orders    None       Hayden Rasmussen, MD 04/17/19 1113

## 2019-04-16 NOTE — Telephone Encounter (Signed)
The LFTs came back as showing a bilirubin of 6.1.  This is markedly worse than before.  Because she was having nausea and slight unsteadiness of the gait today, I recommended that she go to the emergency room. I left her a voicemail and we will call her in a little bit to make sure that she gets this message.

## 2019-04-16 NOTE — Discharge Instructions (Addendum)
You were seen in the emergency department for evaluation of your worsening anemia and elevation of your bilirubin.  You had a chest x-ray EKG and your trending pulse ox were all normal in the department.  Your symptoms improved after some Zofran.  At this point I do not think you need to be admitted for transfusion but will need close follow-up with your doctors to make sure your counts do not drop any further.  Please return to the emergency department if any worsening or concerning symptoms

## 2019-04-16 NOTE — ED Notes (Signed)
Pt. ambulated with no assist. Pt. O2 was at 100% on room air while pulse at 88. Nurse aware.

## 2019-04-17 LAB — HAPTOGLOBIN: Haptoglobin: <10 mg/dL — ABNORMAL LOW (ref 37–355)

## 2019-04-17 LAB — ERYTHROPOIETIN: Erythropoietin: 43.1 m[IU]/mL — ABNORMAL HIGH (ref 2.6–18.5)

## 2019-04-23 ENCOUNTER — Telehealth: Payer: Self-pay | Admitting: Hematology and Oncology

## 2019-04-23 NOTE — Progress Notes (Signed)
Patient Care Team: Lawerance Cruel, MD as PCP - General (Family Medicine) [name], MD as PCP - Cardiology (Cardiology) Stephens Shire, MD as Referring Physician (Family Medicine)  DIAGNOSIS:    ICD-10-CM   1. Anemia due to stage 3 chronic kidney disease, unspecified whether stage 3a or 3b CKD  N18.30    D63.1     CHIEF COMPLIANT: Follow-up of chronic anemia and thrombocytopenia  INTERVAL HISTORY: Dana Watkins is a 64 y.o. with above-mentioned history of chronic anemia secondary to stage 3 chronic kidney disease and thrombocytopenia. She was seen in the ED on 04/16/19 after labs showed bilirubin of 6.1 and she reported nausea and unsteadiness of gait. She presents to the clinic today for follow-up.   ALLERGIES:  is allergic to sulfa antibiotics.  MEDICATIONS:  Current Outpatient Medications  Medication Sig Dispense Refill  . butalbital-acetaminophen-caffeine (FIORICET) 50-325-40 MG tablet Take 1 tablet by mouth every 6 (six) hours as needed for headache or migraine.     . Cholecalciferol (VITAMIN D3) 2000 units TABS Take 2,000 Units by mouth daily.     . folic acid (FOLVITE) 1 MG tablet Take 1 tablet (1 mg total) by mouth daily. 30 tablet 0  . MAGNESIUM PO Take 1 tablet by mouth daily.     . Multiple Vitamin (MULTIVITAMIN WITH MINERALS) TABS tablet Take 1 tablet by mouth daily. 30 tablet 0  . ondansetron (ZOFRAN-ODT) 4 MG disintegrating tablet Take 1 tablet (4 mg total) by mouth every 8 (eight) hours as needed for nausea or vomiting. 20 tablet 0  . Potassium Chloride ER 20 MEQ TBCR Take 3 tablets by mouth in the morning and at bedtime.     . thiamine 100 MG tablet Take 1 tablet (100 mg total) by mouth daily. 30 tablet 0   No current facility-administered medications for this visit.    PHYSICAL EXAMINATION: ECOG PERFORMANCE STATUS: 1 - Symptomatic but completely ambulatory  There were no vitals filed for this visit. There were no vitals filed for this  visit.  LABORATORY DATA:  I have reviewed the data as listed CMP Latest Ref Rng & Units 04/16/2019 02/22/2019 02/21/2019  Glucose 70 - 99 mg/dL 98 103(H) 97  BUN 8 - 23 mg/dL 12 8 10   Creatinine 0.44 - 1.00 mg/dL 1.30(H) 1.52(H) 1.58(H)  Sodium 135 - 145 mmol/L 142 139 142  Potassium 3.5 - 5.1 mmol/L 3.2(L) 3.3(L) 3.0(L)  Chloride 98 - 111 mmol/L 117(H) 118(H) 119(H)  CO2 22 - 32 mmol/L 16(L) 15(L) 17(L)  Calcium 8.9 - 10.3 mg/dL 9.6 8.4(L) 8.4(L)  Total Protein 6.5 - 8.1 g/dL 6.6 5.6(L) 5.2(L)  Total Bilirubin 0.3 - 1.2 mg/dL 6.1(HH) 4.4(H) 3.9(H)  Alkaline Phos 38 - 126 U/L 209(H) 164(H) 146(H)  AST 15 - 41 U/L 69(H) 48(H) 44(H)  ALT 0 - 44 U/L 38 28 27    Lab Results  Component Value Date   WBC 4.9 04/16/2019   HGB 8.1 (L) 04/16/2019   HCT 26.1 (L) 04/16/2019   MCV 96.3 04/16/2019   PLT 57 (L) 04/16/2019   NEUTROABS 3.0 04/16/2019    ASSESSMENT & PLAN:  Anemia due to stage 3 chronic kidney disease Cirrhosis of the liver  Chronic normocytic anemia: GFR 36 Secondary to chronic kidney disease as well as secondary to cirrhosis of the liver.  Lab review 04/16/2019: Hemoglobin 8.1, MCV 96.3 platelets 57, erythropoietin 43.1, B12 1087, haptoglobin less than 10 (due to cirrhosis), total bilirubin 6.1, creatinine 1.3, LDH  and folic acid normal  We sent her to the emergency room last time when she was not feeling well with a bilirubin of 6.1.  She is feeling a lot better today.  She does not complain of shortness of breath exertion. Weight gain: I suspect it is related to fluid retention because of cirrhosis.  Plan: Monthly CBCs and transfusion if necessary. We requested nephrology consultation.  We will check up on that appointment. She has appointment to see gastroenterology.  She anticipates requiring endoscopies.  I will see her back in 2 months for follow-up.   No orders of the defined types were placed in this encounter.  The patient has a good understanding of the overall  plan. she agrees with it. she will call with any problems that may develop before the next visit here.  Total time spent: 20 mins including face to face time and time spent for planning, charting and coordination of care  Dana Lose, MD 04/24/2019  I, Dana Watkins, am acting as scribe for Dr. Nicholas Watkins.  I have reviewed the above documentation for accuracy and completeness, and I agree with the above.

## 2019-04-23 NOTE — Telephone Encounter (Signed)
Scheduled per 04/05 los, called patient and left a voicemail.

## 2019-04-24 ENCOUNTER — Inpatient Hospital Stay (HOSPITAL_BASED_OUTPATIENT_CLINIC_OR_DEPARTMENT_OTHER): Payer: Medicare (Managed Care) | Admitting: Hematology and Oncology

## 2019-04-24 ENCOUNTER — Other Ambulatory Visit: Payer: Self-pay

## 2019-04-24 ENCOUNTER — Other Ambulatory Visit: Payer: Self-pay | Admitting: Hematology and Oncology

## 2019-04-24 DIAGNOSIS — R110 Nausea: Secondary | ICD-10-CM | POA: Diagnosis not present

## 2019-04-24 DIAGNOSIS — K7581 Nonalcoholic steatohepatitis (NASH): Secondary | ICD-10-CM | POA: Diagnosis not present

## 2019-04-24 DIAGNOSIS — R531 Weakness: Secondary | ICD-10-CM | POA: Diagnosis not present

## 2019-04-24 DIAGNOSIS — D631 Anemia in chronic kidney disease: Secondary | ICD-10-CM | POA: Diagnosis present

## 2019-04-24 DIAGNOSIS — R269 Unspecified abnormalities of gait and mobility: Secondary | ICD-10-CM | POA: Diagnosis not present

## 2019-04-24 DIAGNOSIS — D696 Thrombocytopenia, unspecified: Secondary | ICD-10-CM

## 2019-04-24 DIAGNOSIS — Z8249 Family history of ischemic heart disease and other diseases of the circulatory system: Secondary | ICD-10-CM | POA: Diagnosis not present

## 2019-04-24 DIAGNOSIS — N183 Chronic kidney disease, stage 3 unspecified: Secondary | ICD-10-CM

## 2019-04-24 DIAGNOSIS — E876 Hypokalemia: Secondary | ICD-10-CM | POA: Diagnosis not present

## 2019-04-24 DIAGNOSIS — K7030 Alcoholic cirrhosis of liver without ascites: Secondary | ICD-10-CM | POA: Diagnosis not present

## 2019-04-24 DIAGNOSIS — Z7289 Other problems related to lifestyle: Secondary | ICD-10-CM | POA: Diagnosis not present

## 2019-04-24 DIAGNOSIS — F17210 Nicotine dependence, cigarettes, uncomplicated: Secondary | ICD-10-CM | POA: Diagnosis not present

## 2019-04-24 DIAGNOSIS — Z882 Allergy status to sulfonamides status: Secondary | ICD-10-CM | POA: Diagnosis not present

## 2019-04-24 DIAGNOSIS — Z79899 Other long term (current) drug therapy: Secondary | ICD-10-CM | POA: Diagnosis not present

## 2019-04-24 DIAGNOSIS — R112 Nausea with vomiting, unspecified: Secondary | ICD-10-CM | POA: Diagnosis not present

## 2019-04-24 DIAGNOSIS — Z832 Family history of diseases of the blood and blood-forming organs and certain disorders involving the immune mechanism: Secondary | ICD-10-CM | POA: Diagnosis not present

## 2019-04-24 NOTE — Assessment & Plan Note (Signed)
Chronic normocytic anemia: GFR 36 Secondary to chronic kidney disease as well as secondary to cirrhosis of the liver.  Lab review 04/16/2019: Hemoglobin 8.1, MCV 96.3 platelets 57, erythropoietin 43.1, B12 1087, haptoglobin less than 10 (due to cirrhosis), total bilirubin 6.1, creatinine 1.3, LDH and folic acid normal  Lab review 04/24/2019:

## 2019-05-03 ENCOUNTER — Other Ambulatory Visit: Payer: Self-pay | Admitting: Gastroenterology

## 2019-05-03 DIAGNOSIS — K7460 Unspecified cirrhosis of liver: Secondary | ICD-10-CM

## 2019-05-09 ENCOUNTER — Other Ambulatory Visit: Payer: Medicare (Managed Care)

## 2019-05-10 ENCOUNTER — Ambulatory Visit
Admission: RE | Admit: 2019-05-10 | Discharge: 2019-05-10 | Disposition: A | Discharge status: Home / Self Care | Payer: Medicare (Managed Care) | Source: Ambulatory Visit | Attending: Gastroenterology | Admitting: Gastroenterology

## 2019-05-10 DIAGNOSIS — K7460 Unspecified cirrhosis of liver: Secondary | ICD-10-CM

## 2019-05-17 ENCOUNTER — Telehealth: Payer: Self-pay | Admitting: Hematology and Oncology

## 2019-05-17 ENCOUNTER — Other Ambulatory Visit: Payer: Self-pay | Admitting: Gastroenterology

## 2019-05-17 NOTE — Telephone Encounter (Signed)
Scheduled per 04/12 los, patient has been called and voicemail was left.

## 2019-05-22 ENCOUNTER — Inpatient Hospital Stay: Payer: Medicare (Managed Care) | Attending: Hematology and Oncology

## 2019-05-28 ENCOUNTER — Other Ambulatory Visit (HOSPITAL_COMMUNITY)
Admission: RE | Admit: 2019-05-28 | Discharge: 2019-05-28 | Disposition: A | Discharge status: Home / Self Care | Payer: Medicare (Managed Care) | Source: Ambulatory Visit | Attending: Gastroenterology | Admitting: Gastroenterology

## 2019-05-28 DIAGNOSIS — Z20822 Contact with and (suspected) exposure to covid-19: Secondary | ICD-10-CM | POA: Diagnosis not present

## 2019-05-28 DIAGNOSIS — Z01812 Encounter for preprocedural laboratory examination: Secondary | ICD-10-CM | POA: Insufficient documentation

## 2019-05-28 LAB — SARS CORONAVIRUS 2 (TAT 6-24 HRS): SARS Coronavirus 2: NEGATIVE

## 2019-05-29 ENCOUNTER — Encounter (HOSPITAL_COMMUNITY): Payer: Self-pay | Admitting: Anesthesiology

## 2019-05-29 NOTE — Anesthesia Preprocedure Evaluation (Deleted)
Anesthesia Evaluation    Reviewed: Allergy & Precautions, Patient's Chart, lab work & pertinent test results  History of Anesthesia Complications Negative for: history of anesthetic complications  Airway        Dental   Pulmonary former smoker,           Cardiovascular hypertension, + CAD    TTE 10/2018: EF 60-65%, mild LVH, severe LAE/RAE, moderate MR, mild TR   Neuro/Psych  Headaches, Anxiety    GI/Hepatic negative GI ROS, (+) Cirrhosis       ,   Endo/Other  negative endocrine ROS  Renal/GU CRFRenal disease  negative genitourinary   Musculoskeletal  (+) Arthritis ,   Abdominal   Peds  Hematology  (+) anemia , ITP   Anesthesia Other Findings Day of surgery medications reviewed with patient.  Reproductive/Obstetrics negative OB ROS                             Anesthesia Physical Anesthesia Plan  ASA: III  Anesthesia Plan: MAC   Post-op Pain Management:    Induction:   PONV Risk Score and Plan: 2 and Propofol infusion, Treatment may vary due to age or medical condition and Ondansetron  Airway Management Planned: Natural Airway and Simple Face Mask  Additional Equipment: None  Intra-op Plan:   Post-operative Plan:   Informed Consent:   Plan Discussed with:   Anesthesia Plan Comments:         Anesthesia Quick Evaluation

## 2019-05-30 ENCOUNTER — Encounter (HOSPITAL_COMMUNITY)
Admission: RE | Disposition: A | Discharge status: Home / Self Care | Payer: Self-pay | Source: Home / Self Care | Attending: Gastroenterology

## 2019-05-30 ENCOUNTER — Ambulatory Visit (HOSPITAL_COMMUNITY)
Admission: RE | Admit: 2019-05-30 | Discharge: 2019-05-30 | Disposition: A | Discharge status: Home / Self Care | Payer: Medicare (Managed Care) | Attending: Gastroenterology | Admitting: Gastroenterology

## 2019-05-30 ENCOUNTER — Other Ambulatory Visit: Payer: Self-pay | Admitting: Gastroenterology

## 2019-05-30 SURGERY — CANCELLED PROCEDURE
Anesthesia: Monitor Anesthesia Care

## 2019-05-30 MED ORDER — SODIUM CHLORIDE 0.9 % IV SOLN: INTRAVENOUS | Status: DC

## 2019-05-30 MED ORDER — LACTATED RINGERS IV SOLN: INTRAVENOUS | Status: DC

## 2019-05-30 MED ORDER — PROMETHAZINE HCL 25 MG/ML IJ SOLN: 6.2500 mg | INTRAMUSCULAR | Status: DC | PRN

## 2019-05-30 SURGICAL SUPPLY — 15 items

## 2019-05-30 NOTE — Progress Notes (Signed)
Patient arrived to endoscopy for procedure. Dr Paulita Fujita requested platelets pre procedure, however no orders were placed. There was not time in schedule to allow patient to come later in day for procedure so she will be rescheduled per Dr Paulita Fujita. Patient understands.

## 2019-06-09 ENCOUNTER — Other Ambulatory Visit (HOSPITAL_COMMUNITY)
Admission: RE | Admit: 2019-06-09 | Discharge: 2019-06-09 | Disposition: A | Discharge status: Home / Self Care | Payer: Medicare (Managed Care) | Source: Ambulatory Visit | Attending: Gastroenterology | Admitting: Gastroenterology

## 2019-06-09 DIAGNOSIS — Z20822 Contact with and (suspected) exposure to covid-19: Secondary | ICD-10-CM | POA: Insufficient documentation

## 2019-06-09 DIAGNOSIS — Z01812 Encounter for preprocedural laboratory examination: Secondary | ICD-10-CM | POA: Insufficient documentation

## 2019-06-09 LAB — SARS CORONAVIRUS 2 (TAT 6-24 HRS): SARS Coronavirus 2: NEGATIVE

## 2019-06-13 ENCOUNTER — Ambulatory Visit (HOSPITAL_COMMUNITY): Payer: Medicare (Managed Care) | Admitting: Anesthesiology

## 2019-06-13 ENCOUNTER — Encounter (HOSPITAL_COMMUNITY)
Admission: RE | Disposition: A | Discharge status: Home / Self Care | Payer: Self-pay | Source: Home / Self Care | Attending: Gastroenterology

## 2019-06-13 ENCOUNTER — Encounter (HOSPITAL_COMMUNITY): Payer: Self-pay | Admitting: Gastroenterology

## 2019-06-13 ENCOUNTER — Other Ambulatory Visit: Payer: Self-pay

## 2019-06-13 ENCOUNTER — Ambulatory Visit (HOSPITAL_COMMUNITY)
Admission: RE | Admit: 2019-06-13 | Discharge: 2019-06-13 | Disposition: A | Discharge status: Home / Self Care | Payer: Medicare (Managed Care) | Attending: Gastroenterology | Admitting: Gastroenterology

## 2019-06-13 DIAGNOSIS — D693 Immune thrombocytopenic purpura: Secondary | ICD-10-CM | POA: Insufficient documentation

## 2019-06-13 DIAGNOSIS — K7460 Unspecified cirrhosis of liver: Secondary | ICD-10-CM | POA: Diagnosis not present

## 2019-06-13 DIAGNOSIS — R112 Nausea with vomiting, unspecified: Secondary | ICD-10-CM | POA: Insufficient documentation

## 2019-06-13 DIAGNOSIS — I10 Essential (primary) hypertension: Secondary | ICD-10-CM | POA: Diagnosis not present

## 2019-06-13 DIAGNOSIS — M1990 Unspecified osteoarthritis, unspecified site: Secondary | ICD-10-CM | POA: Diagnosis not present

## 2019-06-13 DIAGNOSIS — Z87891 Personal history of nicotine dependence: Secondary | ICD-10-CM | POA: Insufficient documentation

## 2019-06-13 DIAGNOSIS — K3189 Other diseases of stomach and duodenum: Secondary | ICD-10-CM | POA: Diagnosis not present

## 2019-06-13 DIAGNOSIS — F419 Anxiety disorder, unspecified: Secondary | ICD-10-CM | POA: Insufficient documentation

## 2019-06-13 DIAGNOSIS — I2510 Atherosclerotic heart disease of native coronary artery without angina pectoris: Secondary | ICD-10-CM | POA: Insufficient documentation

## 2019-06-13 DIAGNOSIS — K209 Esophagitis, unspecified without bleeding: Secondary | ICD-10-CM | POA: Diagnosis not present

## 2019-06-13 DIAGNOSIS — K766 Portal hypertension: Secondary | ICD-10-CM | POA: Diagnosis not present

## 2019-06-13 DIAGNOSIS — Z79899 Other long term (current) drug therapy: Secondary | ICD-10-CM | POA: Diagnosis not present

## 2019-06-13 HISTORY — PX: ESOPHAGOGASTRODUODENOSCOPY (EGD) WITH PROPOFOL: SHX5813

## 2019-06-13 SURGERY — ESOPHAGOGASTRODUODENOSCOPY (EGD) WITH PROPOFOL
Anesthesia: Monitor Anesthesia Care | Laterality: Left

## 2019-06-13 SURGERY — ESOPHAGOGASTRODUODENOSCOPY (EGD) WITH PROPOFOL
Anesthesia: Monitor Anesthesia Care

## 2019-06-13 MED ORDER — SODIUM CHLORIDE 0.9 % IV SOLN: INTRAVENOUS | Status: DC

## 2019-06-13 MED ORDER — ONDANSETRON HCL 4 MG/2ML IJ SOLN
INTRAMUSCULAR | Status: AC
  Filled 2019-06-13: qty 2

## 2019-06-13 MED ORDER — PROPOFOL 500 MG/50ML IV EMUL
INTRAVENOUS | Status: DC | PRN
  Administered 2019-06-13: 140 ug/kg/min via INTRAVENOUS

## 2019-06-13 MED ORDER — ONDANSETRON HCL 4 MG/2ML IJ SOLN: 4.0000 mg | Freq: Once | INTRAMUSCULAR | Status: DC

## 2019-06-13 MED ORDER — OMEPRAZOLE MAGNESIUM 20 MG PO TBEC
20.0000 mg | DELAYED_RELEASE_TABLET | Freq: Every day | ORAL | 1 refills | Status: DC
Start: 2019-06-13 — End: 2019-08-06

## 2019-06-13 MED ORDER — SODIUM CHLORIDE 0.9% IV SOLUTION: Freq: Once | INTRAVENOUS | Status: DC

## 2019-06-13 MED ORDER — LACTATED RINGERS IV SOLN: INTRAVENOUS | Status: DC

## 2019-06-13 MED ORDER — PROPOFOL 10 MG/ML IV BOLUS
INTRAVENOUS | Status: DC | PRN
  Administered 2019-06-13: 20 mg via INTRAVENOUS
  Administered 2019-06-13 (×2): 40 mg via INTRAVENOUS

## 2019-06-13 MED ORDER — LIDOCAINE HCL (CARDIAC) PF 100 MG/5ML IV SOSY
PREFILLED_SYRINGE | INTRAVENOUS | Status: DC | PRN
  Administered 2019-06-13: 100 mg via INTRAVENOUS

## 2019-06-13 MED ORDER — ONDANSETRON HCL 4 MG/2ML IJ SOLN
4.0000 mg | Freq: Once | INTRAMUSCULAR | Status: AC
  Administered 2019-06-13: 4 mg via INTRAVENOUS

## 2019-06-13 SURGICAL SUPPLY — 15 items

## 2019-06-13 NOTE — Anesthesia Postprocedure Evaluation (Signed)
Anesthesia Post Note  Patient: Dana Watkins  Procedure(s) Performed: ESOPHAGOGASTRODUODENOSCOPY (EGD) WITH PROPOFOL (N/A )     Patient location during evaluation: PACU Anesthesia Type: MAC Level of consciousness: awake and alert and oriented Pain management: pain level controlled Vital Signs Assessment: post-procedure vital signs reviewed and stable Respiratory status: spontaneous breathing, nonlabored ventilation and respiratory function stable Cardiovascular status: stable and blood pressure returned to baseline Postop Assessment: no apparent nausea or vomiting Anesthetic complications: no    Last Vitals:  Vitals:   06/13/19 1010 06/13/19 1020  BP: (!) 125/58 (!) 141/63  Pulse: 70 74  Resp: 20 19  Temp:    SpO2: 100% 100%    Last Pain:  Vitals:   06/13/19 1020  TempSrc:   PainSc: 0-No pain                 Shenia Alan A.

## 2019-06-13 NOTE — H&P (Signed)
Patient interval history reviewed.  Patient examined again.  There has been no change from documented H/P scanned into chart from our office except as documented below.  Assessment:  1.  Nausea, vomiting. 2.  Cirrhosis. 3.  Bleeding tendency; platelets < 100 k; patient given platelet infusion pre-procedure.  Plan:  1.  Endoscopy. 2.  Risks (bleeding, infection, bowel perforation that could require surgery, sedation-related changes in cardiopulmonary systems), benefits (identification and possible treatment of source of symptoms, exclusion of certain causes of symptoms), and alternatives (watchful waiting, radiographic imaging studies, empiric medical treatment) of upper endoscopy (EGD) were explained to patient/family in detail and patient wishes to proceed.

## 2019-06-13 NOTE — Discharge Instructions (Signed)
YOU HAD AN ENDOSCOPIC PROCEDURE TODAY: Refer to the procedure report and other information in the discharge instructions given to you for any specific questions about what was found during the examination. If this information does not answer your questions, please call [facility] office at [phone] to clarify.   YOU SHOULD EXPECT: Some feelings of bloating in the abdomen. Passage of more gas than usual. Walking can help get rid of the air that was put into your GI tract during the procedure and reduce the bloating. If you had a lower endoscopy (such as a colonoscopy or flexible sigmoidoscopy) you may notice spotting of blood in your stool or on the toilet paper. Some abdominal soreness may be present for a day or two, also.  DIET: Your first meal following the procedure should be a light meal and then it is ok to progress to your normal diet. A half-sandwich or bowl of soup is an example of a good first meal. Heavy or fried foods are harder to digest and may make you feel nauseous or bloated. Drink plenty of fluids but you should avoid alcoholic beverages for 24 hours. If you had a esophageal dilation, please see attached instructions for diet.    ACTIVITY: Your care partner should take you home directly after the procedure. You should plan to take it easy, moving slowly for the rest of the day. You can resume normal activity the day after the procedure however YOU SHOULD NOT DRIVE, use power tools, machinery or perform tasks that involve climbing or major physical exertion for 24 hours (because of the sedation medicines used during the test).   SYMPTOMS TO REPORT IMMEDIATELY: A gastroenterologist can be reached at any hour. Please call [phone]  for any of the following symptoms:  . Following upper endoscopy (EGD, EUS, ERCP, esophageal dilation) Vomiting of blood or coffee ground material  New, significant abdominal pain  New, significant chest pain or pain under the shoulder blades  Painful or  persistently difficult swallowing  New shortness of breath  Black, tarry-looking or red, bloody stools  FOLLOW UP:  If any biopsies were taken you will be contacted by phone or by letter within the next 1-3 weeks. Call [phone]  if you have not heard about the biopsies in 3 weeks.  Please also call with any specific questions about appointments or follow up tests.

## 2019-06-13 NOTE — Anesthesia Preprocedure Evaluation (Signed)
Anesthesia Evaluation  Patient identified by MRN, date of birth, ID band Patient awake    Reviewed: Allergy & Precautions, NPO status , Patient's Chart, lab work & pertinent test results  Airway Mallampati: II  TM Distance: >3 FB Neck ROM: Full    Dental no notable dental hx. (+) Teeth Intact, Caps   Pulmonary pneumonia, unresolved, former smoker,    Pulmonary exam normal breath sounds clear to auscultation       Cardiovascular hypertension, Pt. on medications + CAD and + DOE  Normal cardiovascular exam+ Valvular Problems/Murmurs MR  Rhythm:Regular Rate:Normal  Echo 10/31/18 1. Left ventricular ejection fraction, by visual estimation, is 60 to 65%. The left ventricle has normal function. Mildly increased left ventricular size. There is mildly increased left ventricular hypertrophy. The left ventricular hypertrophy involves basal-septum walls.  2. Global right ventricle has normal systolic function.The right ventricular size is normal. No increase in right ventricular wall thickness.  3. Left atrial size was severely dilated.  4. Right atrial size was severely dilated.  5. The mitral valve is normal in structure. Moderate mitral valve regurgitation. No evidence of mitral stenosis.  6. The tricuspid valve is normal in structure. Tricuspid valve regurgitation is mild.  7. The aortic valve is tricuspid Aortic valve regurgitation was not visualized by color flow Doppler. Mild aortic valve sclerosis without stenosis.  8. The pulmonic valve was normal in structure. Pulmonic valve regurgitation is trivial by color flow Doppler.   EKG 4/21- NSR   Neuro/Psych  Headaches, PSYCHIATRIC DISORDERS Anxiety    GI/Hepatic (+) Cirrhosis       , N/V   Endo/Other  negative endocrine ROS  Renal/GU Renal InsufficiencyRenal disease  negative genitourinary   Musculoskeletal  (+) Arthritis , Osteoarthritis,    Abdominal   Peds  Hematology  (+) anemia , Thrombocytopenia   Anesthesia Other Findings   Reproductive/Obstetrics                             Anesthesia Physical Anesthesia Plan  ASA: III  Anesthesia Plan: MAC   Post-op Pain Management:    Induction: Intravenous  PONV Risk Score and Plan: 3 and Ondansetron and Treatment may vary due to age or medical condition  Airway Management Planned: Natural Airway and Nasal Cannula  Additional Equipment:   Intra-op Plan:   Post-operative Plan:   Informed Consent: I have reviewed the patients History and Physical, chart, labs and discussed the procedure including the risks, benefits and alternatives for the proposed anesthesia with the patient or authorized representative who has indicated his/her understanding and acceptance.     Dental advisory given  Plan Discussed with: CRNA and Surgeon  Anesthesia Plan Comments:         Anesthesia Quick Evaluation

## 2019-06-13 NOTE — Transfer of Care (Signed)
Immediate Anesthesia Transfer of Care Note  Patient: LYNLEE STRATTON  Procedure(s) Performed: ESOPHAGOGASTRODUODENOSCOPY (EGD) WITH PROPOFOL (N/A )  Patient Location: PACU  Anesthesia Type:MAC  Level of Consciousness: awake, alert  and oriented  Airway & Oxygen Therapy: Patient Spontanous Breathing and Patient connected to face mask oxygen  Post-op Assessment: Report given to RN and Post -op Vital signs reviewed and stable  Post vital signs: Reviewed and stable  Last Vitals:  Vitals Value Taken Time  BP    Temp    Pulse 80 06/13/19 1001  Resp 16 06/13/19 1001  SpO2 100 % 06/13/19 1001  Vitals shown include unvalidated device data.  Last Pain:  Vitals:   06/13/19 0933  TempSrc: Oral  PainSc:          Complications: No apparent anesthesia complications

## 2019-06-13 NOTE — Op Note (Signed)
[facility] Patient Name: Dana Watkins Procedure Date: 06/13/2019 MRN: [id number] Attending MD: Arta Silence , MD Date of Birth: September 10, 1955 CSN: [id number] Age: 64 Admit Type: Outpatient Procedure:                Upper GI endoscopy Indications:              Nausea with vomiting, cirrhosis Providers:                Arta Silence, MD, Cleda Daub, RN, Corie Chiquito,                            Technician Referring MD:              Medicines:                Monitored Anesthesia Care Complications:            No immediate complications. Estimated Blood Loss:     Estimated blood loss was minimal. Procedure:                Pre-Anesthesia Assessment:                           - Prior to the procedure, a History and Physical                            was performed, and patient medications and                            allergies were reviewed. The patient's tolerance of                            previous anesthesia was also reviewed. The risks                            and benefits of the procedure and the sedation                            options and risks were discussed with the patient.                            All questions were answered, and informed consent                            was obtained. Prior Anticoagulants: The patient has                            taken no previous anticoagulant or antiplatelet                            agents. ASA Grade Assessment: III - A patient with                            severe systemic disease. After reviewing the risks  and benefits, the patient was deemed in                            satisfactory condition to undergo the procedure.                           After obtaining informed consent, the endoscope was                            passed under direct vision. Throughout the                            procedure, the patient's blood pressure, pulse, and                            oxygen  saturations were monitored continuously. The                            [id number]-[id number] ([id number]) was introduced through the                            mouth, and advanced to the second part of duodenum.                            The upper GI endoscopy was accomplished without                            difficulty. The patient tolerated the procedure                            well. Scope In: Scope Out: Findings:      LA Grade A (one or more mucosal breaks less than 5 mm, not extending       between tops of 2 mucosal folds) esophagitis was found.      The exam of the esophagus was otherwise normal. No esophageal varices       were seen.      Moderate portal hypertensive gastropathy was found in the entire       examined stomach.      The exam of the stomach was otherwise normal. No gastric varices were       seen.      The duodenal bulb, first portion of the duodenum and second portion of       the duodenum were normal. Impression:               - LA Grade A esophagitis.                           - Portal hypertensive gastropathy.                           - Normal duodenal bulb, first portion of the                            duodenum and second portion of the duodenum.                           -  No specimens collected. Moderate Sedation:      Not Applicable - Patient had care per Anesthesia. Recommendation:           - Patient has a contact number available for                            emergencies. The signs and symptoms of potential                            delayed complications were discussed with the                            patient. Return to normal activities tomorrow.                            Written discharge instructions were provided to the                            patient.                           - Discharge patient to home (via wheelchair).                           - Resume previous diet today.                           - Follow an antireflux regimen  indefinitely.                           - Use Prilosec (omeprazole) 20 mg PO daily until                            further notice.                           - Return to GI clinic in 3 months.                           - Return to referring physician as previously                            scheduled. Procedure Code(s):        --- Professional ---                           [phone], Esophagogastroduodenoscopy, flexible,                            transoral; diagnostic, including collection of                            specimen(s) by brushing or washing, when performed                            (separate procedure) Diagnosis Code(s):        ---  Professional ---                           K20.90, Esophagitis, unspecified without bleeding                           K76.6, Portal hypertension                           K31.89, Other diseases of stomach and duodenum                           R11.2, Nausea with vomiting, unspecified CPT copyright 2019 [employer]. All rights reserved. The codes documented in this report are preliminary and upon coder review may  be revised to meet current compliance requirements. Arta Silence, MD 06/13/2019 10:05:44 AM This report has been signed electronically. Number of Addenda: 0

## 2019-06-14 ENCOUNTER — Telehealth: Payer: Self-pay | Admitting: Hematology and Oncology

## 2019-06-14 LAB — BPAM PLATELET PHERESIS
Blood Product Expiration Date: 202106042359
ISSUE DATE / TIME: 202106020812
Unit Type and Rh: 5100

## 2019-06-14 LAB — PREPARE PLATELET PHERESIS: Unit division: 0

## 2019-06-14 NOTE — Telephone Encounter (Signed)
R/s appt per 6/15 PAL - unable to reach pt - left message with appt date and time

## 2019-06-26 ENCOUNTER — Ambulatory Visit: Payer: Self-pay | Admitting: Hematology and Oncology

## 2019-06-26 ENCOUNTER — Other Ambulatory Visit: Payer: Medicare (Managed Care)

## 2019-06-26 ENCOUNTER — Ambulatory Visit: Payer: Medicare (Managed Care) | Admitting: Hematology

## 2019-07-04 NOTE — Progress Notes (Signed)
Patient Care Team: Lawerance Cruel, MD as PCP - General (Family Medicine) [name], MD as PCP - Cardiology (Cardiology) Stephens Shire, MD as Referring Physician (Family Medicine)  DIAGNOSIS:    ICD-10-CM   1. Anemia due to stage 3 chronic kidney disease, unspecified whether stage 3a or 3b CKD  N18.30    D63.1     CHIEF COMPLIANT: Follow-up of chronic anemia and thrombocytopenia  INTERVAL HISTORY: Dana Watkins is a 64 y.o. with above-mentioned history of chronic anemia secondary to stage 3 chronic kidney disease and thrombocytopenia. She presents to the clinic today for follow-up.  She reports no new problems or concerns.  She tells me that she is slightly short of breath to physical exertion but otherwise doing quite well.  She felt better after the blood transfusion last month.  ALLERGIES:  is allergic to sulfa antibiotics.  MEDICATIONS:  Current Outpatient Medications  Medication Sig Dispense Refill  . Ascorbic Acid (VITAMIN C) 1000 MG tablet Take 1,000 mg by mouth in the morning and at bedtime. Chewable    . butalbital-acetaminophen-caffeine (FIORICET) 50-325-40 MG tablet Take 1 tablet by mouth every 6 (six) hours as needed for headache or migraine.     . folic acid (FOLVITE) 1 MG tablet Take 1 tablet (1 mg total) by mouth daily. 30 tablet 0  . MAGNESIUM PO Take 400 mg by mouth daily.     . Multiple Vitamin (MULTIVITAMIN WITH MINERALS) TABS tablet Take 1 tablet by mouth daily. (Patient taking differently: Take 1 tablet by mouth daily. Woman's/ Gummie) 30 tablet 0  . omeprazole (PRILOSEC OTC) 20 MG tablet Take 1 tablet (20 mg total) by mouth daily. 28 tablet 1  . ondansetron (ZOFRAN-ODT) 4 MG disintegrating tablet Take 1 tablet (4 mg total) by mouth every 8 (eight) hours as needed for nausea or vomiting. 20 tablet 0  . Potassium Chloride ER 20 MEQ TBCR Take 20 tablets by mouth in the morning and at bedtime.      No current facility-administered medications for  this visit.    PHYSICAL EXAMINATION: ECOG PERFORMANCE STATUS: 1 - Symptomatic but completely ambulatory  Vitals:   07/05/19 1524  BP: (!) 143/60  Pulse: 79  Resp: 16  Temp: 98.5 F (36.9 C)  SpO2: 100%   Filed Weights   07/05/19 1524  Weight: 142 lb 9.6 oz (64.7 kg)    LABORATORY DATA:  I have reviewed the data as listed CMP Latest Ref Rng & Units 04/16/2019 02/22/2019 02/21/2019  Glucose 70 - 99 mg/dL 98 103(H) 97  BUN 8 - 23 mg/dL 12 8 10   Creatinine 0.44 - 1.00 mg/dL 1.30(H) 1.52(H) 1.58(H)  Sodium 135 - 145 mmol/L 142 139 142  Potassium 3.5 - 5.1 mmol/L 3.2(L) 3.3(L) 3.0(L)  Chloride 98 - 111 mmol/L 117(H) 118(H) 119(H)  CO2 22 - 32 mmol/L 16(L) 15(L) 17(L)  Calcium 8.9 - 10.3 mg/dL 9.6 8.4(L) 8.4(L)  Total Protein 6.5 - 8.1 g/dL 6.6 5.6(L) 5.2(L)  Total Bilirubin 0.3 - 1.2 mg/dL 6.1(HH) 4.4(H) 3.9(H)  Alkaline Phos 38 - 126 U/L 209(H) 164(H) 146(H)  AST 15 - 41 U/L 69(H) 48(H) 44(H)  ALT 0 - 44 U/L 38 28 27    Lab Results  Component Value Date   WBC 3.8 (L) 07/05/2019   HGB 7.8 (L) 07/05/2019   HCT 25.4 (L) 07/05/2019   MCV 93.7 07/05/2019   PLT 60 (L) 07/05/2019   NEUTROABS 2.1 07/05/2019    ASSESSMENT & PLAN:  Anemia due to stage 3 chronic kidney disease Chronic normocytic anemia: GFR 36 Secondary to chronic kidney disease as well as secondary to cirrhosis of the liver.  Lab review 04/16/2019: Hemoglobin 8.1, MCV 96.3 platelets 57, erythropoietin 43.1, B12 1087, haptoglobin less than 10 (due to cirrhosis), total bilirubin 6.1, creatinine 1.3, LDH and folic acid normal  Plan: Monthly CBCs and transfusion if necessary. We requested nephrology consultation.  Today's hemoglobin is 7.8.  We will plan to transfuse 1 unit of PRBC.  I recommended starting her on Aranesp injections at 500 mcg every 3 weeks starting next week. Return to clinic in 4 weeks for injection labs and follow-up.  Liver cirrhosis: Patient continues to drink and I discussed with her that  her child's class is 3 which translates into a medium life expectancy of 1 to 3 years.  I will see her once a month for follow-up.  No orders of the defined types were placed in this encounter.  The patient has a good understanding of the overall plan. she agrees with it. she will call with any problems that may develop before the next visit here.  Total time spent: 30 mins including face to face time and time spent for planning, charting and coordination of care  Brunetta Genera, MD 07/05/2019  I, Cloyde Reams Dorshimer, am acting as scribe for Dr. Nicholas Lose.  I have reviewed the above documentation for accuracy and completeness, and I agree with the above.

## 2019-07-05 ENCOUNTER — Inpatient Hospital Stay: Payer: Medicare (Managed Care) | Attending: Hematology and Oncology

## 2019-07-05 ENCOUNTER — Encounter: Payer: Self-pay | Admitting: *Deleted

## 2019-07-05 ENCOUNTER — Inpatient Hospital Stay (HOSPITAL_BASED_OUTPATIENT_CLINIC_OR_DEPARTMENT_OTHER): Payer: Medicare (Managed Care) | Admitting: Hematology and Oncology

## 2019-07-05 ENCOUNTER — Other Ambulatory Visit: Payer: Self-pay

## 2019-07-05 ENCOUNTER — Other Ambulatory Visit: Payer: Self-pay | Admitting: *Deleted

## 2019-07-05 DIAGNOSIS — R0602 Shortness of breath: Secondary | ICD-10-CM | POA: Diagnosis not present

## 2019-07-05 DIAGNOSIS — N183 Chronic kidney disease, stage 3 unspecified: Secondary | ICD-10-CM | POA: Diagnosis not present

## 2019-07-05 DIAGNOSIS — D631 Anemia in chronic kidney disease: Secondary | ICD-10-CM

## 2019-07-05 DIAGNOSIS — K7460 Unspecified cirrhosis of liver: Secondary | ICD-10-CM | POA: Diagnosis not present

## 2019-07-05 DIAGNOSIS — D696 Thrombocytopenia, unspecified: Secondary | ICD-10-CM | POA: Insufficient documentation

## 2019-07-05 DIAGNOSIS — Z79899 Other long term (current) drug therapy: Secondary | ICD-10-CM | POA: Diagnosis not present

## 2019-07-05 DIAGNOSIS — Z882 Allergy status to sulfonamides status: Secondary | ICD-10-CM | POA: Diagnosis not present

## 2019-07-05 LAB — CBC WITH DIFFERENTIAL (CANCER CENTER ONLY)
Abs Immature Granulocytes: 0.01 10*3/uL (ref 0.00–0.07)
Basophils Absolute: 0 10*3/uL (ref 0.0–0.1)
Basophils Relative: 1 %
Eosinophils Absolute: 0.1 10*3/uL (ref 0.0–0.5)
Eosinophils Relative: 3 %
HCT: 25.4 % — ABNORMAL LOW (ref 36.0–46.0)
Hemoglobin: 7.8 g/dL — ABNORMAL LOW (ref 12.0–15.0)
Immature Granulocytes: 0 %
Lymphocytes Relative: 25 %
Lymphs Abs: 1 10*3/uL (ref 0.7–4.0)
MCH: 28.8 pg (ref 26.0–34.0)
MCHC: 30.7 g/dL (ref 30.0–36.0)
MCV: 93.7 fL (ref 80.0–100.0)
Monocytes Absolute: 0.5 10*3/uL (ref 0.1–1.0)
Monocytes Relative: 14 %
Neutro Abs: 2.1 10*3/uL (ref 1.7–7.7)
Neutrophils Relative %: 57 %
Platelet Count: 60 10*3/uL — ABNORMAL LOW (ref 150–400)
RBC: 2.71 MIL/uL — ABNORMAL LOW (ref 3.87–5.11)
RDW: 18.2 % — ABNORMAL HIGH (ref 11.5–15.5)
WBC Count: 3.8 10*3/uL — ABNORMAL LOW (ref 4.0–10.5)
nRBC: 0 % (ref 0.0–0.2)

## 2019-07-05 LAB — CMP (CANCER CENTER ONLY)
ALT: 32 U/L (ref 0–44)
AST: 64 U/L — ABNORMAL HIGH (ref 15–41)
Albumin: 2.8 g/dL — ABNORMAL LOW (ref 3.5–5.0)
Alkaline Phosphatase: 262 U/L — ABNORMAL HIGH (ref 38–126)
Anion gap: 10 (ref 5–15)
BUN: 11 mg/dL (ref 8–23)
CO2: 17 mmol/L — ABNORMAL LOW (ref 22–32)
Calcium: 9.2 mg/dL (ref 8.9–10.3)
Chloride: 114 mmol/L — ABNORMAL HIGH (ref 98–111)
Creatinine: 1.29 mg/dL — ABNORMAL HIGH (ref 0.44–1.00)
GFR, Est AFR Am: 51 mL/min — ABNORMAL LOW (ref >60)
GFR, Estimated: 44 mL/min — ABNORMAL LOW (ref >60)
Glucose, Bld: 95 mg/dL (ref 70–99)
Potassium: 3 mmol/L — CL (ref 3.5–5.1)
Sodium: 141 mmol/L (ref 135–145)
Total Bilirubin: 5 mg/dL (ref 0.3–1.2)
Total Protein: 6 g/dL — ABNORMAL LOW (ref 6.5–8.1)

## 2019-07-05 LAB — PREPARE RBC (CROSSMATCH)

## 2019-07-05 LAB — SAMPLE TO BLOOD BANK

## 2019-07-05 NOTE — Assessment & Plan Note (Signed)
Chronic normocytic anemia: GFR 36 Secondary to chronic kidney disease as well as secondary to cirrhosis of the liver.  Lab review 04/16/2019: Hemoglobin 8.1, MCV 96.3 platelets 57, erythropoietin 43.1, B12 1087, haptoglobin less than 10 (due to cirrhosis), total bilirubin 6.1, creatinine 1.3, LDH and folic acid normal  Plan: Monthly CBCs and transfusion if necessary. We requested nephrology consultation.   Return to clinic in 3 months with labs and follow-up

## 2019-07-05 NOTE — Progress Notes (Signed)
CRITICAL VALUE ALERT  Critical Value:  K 3.0 total bili 5.0  Date & Time Notied:  07/05/19 at 1555  Provider Notified: Nicholas Lose, MD  Orders Received/Actions taken: MD notified and verbalized understanding.  No new orders received at this time.

## 2019-07-05 NOTE — Progress Notes (Signed)
CRITICAL VALUE ALERT  Critical Value:  Hgb 7.8  Date & Time Notied:  07/06/19 at 1525  Provider Notified: Nicholas Lose, MD  Orders Received/Actions taken: MD notified.

## 2019-07-06 ENCOUNTER — Telehealth: Payer: Self-pay | Admitting: Hematology and Oncology

## 2019-07-06 LAB — ABO/RH: ABO/RH(D): O POS

## 2019-07-06 NOTE — Telephone Encounter (Signed)
Scheduled appts per 6/24 los. Was not able to leave a voicmail. Pt to get updated appt calendar at next visit per appt notes.

## 2019-07-07 ENCOUNTER — Other Ambulatory Visit: Payer: Self-pay

## 2019-07-07 ENCOUNTER — Inpatient Hospital Stay: Payer: Medicare (Managed Care)

## 2019-07-07 DIAGNOSIS — N183 Chronic kidney disease, stage 3 unspecified: Secondary | ICD-10-CM

## 2019-07-07 MED ORDER — SODIUM CHLORIDE 0.9% IV SOLUTION
250.0000 mL | Freq: Once | INTRAVENOUS | Status: AC
  Administered 2019-07-07: 250 mL via INTRAVENOUS
  Filled 2019-07-07: qty 250

## 2019-07-07 MED ORDER — ACETAMINOPHEN 325 MG PO TABS
650.0000 mg | ORAL_TABLET | Freq: Once | ORAL | Status: AC
  Administered 2019-07-07: 650 mg via ORAL

## 2019-07-07 MED ORDER — DIPHENHYDRAMINE HCL 25 MG PO CAPS
25.0000 mg | ORAL_CAPSULE | Freq: Once | ORAL | Status: AC
  Administered 2019-07-07: 25 mg via ORAL

## 2019-07-07 NOTE — Patient Instructions (Signed)
Blood Transfusion, Adult, Care After This sheet gives you information about how to care for yourself after your procedure. Your doctor may also give you more specific instructions. If you have problems or questions, contact your doctor. What can I expect after the procedure? After the procedure, it is common to have:  Bruising and soreness at the IV site.  A fever or chills on the day of the procedure. This may be your body's response to the new blood cells received.  A headache. Follow these instructions at home: Insertion site care      Follow instructions from your doctor about how to take care of your insertion site. This is where an IV tube was put into your vein. Make sure you: ? Wash your hands with soap and water before and after you change your bandage (dressing). If you cannot use soap and water, use hand sanitizer. ? Change your bandage as told by your doctor.  Check your insertion site every day for signs of infection. Check for: ? Redness, swelling, or pain. ? Bleeding from the site. ? Warmth. ? Pus or a bad smell. General instructions  Take over-the-counter and prescription medicines only as told by your doctor.  Rest as told by your doctor.  Go back to your normal activities as told by your doctor.  Keep all follow-up visits as told by your doctor. This is important. Contact a doctor if:  You have itching or red, swollen areas of skin (hives).  You feel worried or nervous (anxious).  You feel weak after doing your normal activities.  You have redness, swelling, warmth, or pain around the insertion site.  You have blood coming from the insertion site, and the blood does not stop with pressure.  You have pus or a bad smell coming from the insertion site. Get help right away if:  You have signs of a serious reaction. This may be coming from an allergy or the body's defense system (immune system). Signs include: ? Trouble breathing or shortness of  breath. ? Swelling of the face or feeling warm (flushed). ? Fever or chills. ? Head, chest, or back pain. ? Dark pee (urine) or blood in the pee. ? Widespread rash. ? Fast heartbeat. ? Feeling dizzy or light-headed. You may receive your blood transfusion in an outpatient setting. If so, you will be told whom to contact to report any reactions. These symptoms may be an emergency. Do not wait to see if the symptoms will go away. Get medical help right away. Call your local emergency services (911 in the [place] Do not drive yourself to the hospital. Summary  Bruising and soreness at the IV site are common.  Check your insertion site every day for signs of infection.  Rest as told by your doctor. Go back to your normal activities as told by your doctor.  Get help right away if you have signs of a serious reaction. This information is not intended to replace advice given to you by your health care provider. Make sure you discuss any questions you have with your health care provider. Document Revised: 06/22/2018 Document Reviewed: 06/22/2018 Elsevier Patient Education  [employer].

## 2019-07-08 LAB — TYPE AND SCREEN
ABO/RH(D): O POS
Antibody Screen: NEGATIVE
Unit division: 0

## 2019-07-08 LAB — BPAM RBC
Blood Product Expiration Date: 202107242359
ISSUE DATE / TIME: 202106260959
Unit Type and Rh: 5100

## 2019-07-12 ENCOUNTER — Other Ambulatory Visit: Payer: Self-pay

## 2019-07-12 ENCOUNTER — Encounter: Payer: Self-pay | Admitting: *Deleted

## 2019-07-12 ENCOUNTER — Inpatient Hospital Stay: Payer: Medicare (Managed Care)

## 2019-07-12 ENCOUNTER — Inpatient Hospital Stay: Payer: Medicare (Managed Care) | Attending: Hematology and Oncology

## 2019-07-12 ENCOUNTER — Other Ambulatory Visit: Payer: Self-pay | Admitting: *Deleted

## 2019-07-12 VITALS — BP 147/76 | HR 75 | Resp 18

## 2019-07-12 DIAGNOSIS — N183 Chronic kidney disease, stage 3 unspecified: Secondary | ICD-10-CM | POA: Insufficient documentation

## 2019-07-12 DIAGNOSIS — D631 Anemia in chronic kidney disease: Secondary | ICD-10-CM | POA: Diagnosis present

## 2019-07-12 DIAGNOSIS — D696 Thrombocytopenia, unspecified: Secondary | ICD-10-CM | POA: Insufficient documentation

## 2019-07-12 DIAGNOSIS — Z79899 Other long term (current) drug therapy: Secondary | ICD-10-CM | POA: Diagnosis not present

## 2019-07-12 DIAGNOSIS — Z882 Allergy status to sulfonamides status: Secondary | ICD-10-CM | POA: Diagnosis not present

## 2019-07-12 DIAGNOSIS — K7460 Unspecified cirrhosis of liver: Secondary | ICD-10-CM | POA: Diagnosis not present

## 2019-07-12 LAB — CBC WITH DIFFERENTIAL (CANCER CENTER ONLY)
Abs Immature Granulocytes: 0.01 10*3/uL (ref 0.00–0.07)
Basophils Absolute: 0 10*3/uL (ref 0.0–0.1)
Basophils Relative: 1 %
Eosinophils Absolute: 0.1 10*3/uL (ref 0.0–0.5)
Eosinophils Relative: 2 %
HCT: 29.3 % — ABNORMAL LOW (ref 36.0–46.0)
Hemoglobin: 9.3 g/dL — ABNORMAL LOW (ref 12.0–15.0)
Immature Granulocytes: 0 %
Lymphocytes Relative: 25 %
Lymphs Abs: 1.2 10*3/uL (ref 0.7–4.0)
MCH: 29.2 pg (ref 26.0–34.0)
MCHC: 31.7 g/dL (ref 30.0–36.0)
MCV: 91.8 fL (ref 80.0–100.0)
Monocytes Absolute: 0.7 10*3/uL (ref 0.1–1.0)
Monocytes Relative: 14 %
Neutro Abs: 2.8 10*3/uL (ref 1.7–7.7)
Neutrophils Relative %: 58 %
Platelet Count: 55 10*3/uL — ABNORMAL LOW (ref 150–400)
RBC: 3.19 MIL/uL — ABNORMAL LOW (ref 3.87–5.11)
RDW: 18.9 % — ABNORMAL HIGH (ref 11.5–15.5)
WBC Count: 4.8 10*3/uL (ref 4.0–10.5)
nRBC: 0 % (ref 0.0–0.2)

## 2019-07-12 LAB — CMP (CANCER CENTER ONLY)
ALT: 35 U/L (ref 0–44)
AST: 67 U/L — ABNORMAL HIGH (ref 15–41)
Albumin: 2.9 g/dL — ABNORMAL LOW (ref 3.5–5.0)
Alkaline Phosphatase: 325 U/L — ABNORMAL HIGH (ref 38–126)
Anion gap: 11 (ref 5–15)
BUN: 8 mg/dL (ref 8–23)
CO2: 16 mmol/L — ABNORMAL LOW (ref 22–32)
Calcium: 9.1 mg/dL (ref 8.9–10.3)
Chloride: 113 mmol/L — ABNORMAL HIGH (ref 98–111)
Creatinine: 1.36 mg/dL — ABNORMAL HIGH (ref 0.44–1.00)
GFR, Est AFR Am: 48 mL/min — ABNORMAL LOW (ref >60)
GFR, Estimated: 41 mL/min — ABNORMAL LOW (ref >60)
Glucose, Bld: 83 mg/dL (ref 70–99)
Potassium: 2.7 mmol/L — CL (ref 3.5–5.1)
Sodium: 140 mmol/L (ref 135–145)
Total Bilirubin: 3.8 mg/dL (ref 0.3–1.2)
Total Protein: 6.7 g/dL (ref 6.5–8.1)

## 2019-07-12 LAB — SAMPLE TO BLOOD BANK

## 2019-07-12 MED ORDER — DARBEPOETIN ALFA 500 MCG/ML IJ SOSY
PREFILLED_SYRINGE | INTRAMUSCULAR | Status: AC
  Filled 2019-07-12: qty 1

## 2019-07-12 MED ORDER — DARBEPOETIN ALFA 500 MCG/ML IJ SOSY
500.0000 ug | PREFILLED_SYRINGE | Freq: Once | INTRAMUSCULAR | Status: AC
  Administered 2019-07-12: 500 ug via SUBCUTANEOUS

## 2019-07-12 MED ORDER — SPIRONOLACTONE 25 MG PO TABS
25.0000 mg | ORAL_TABLET | Freq: Every day | ORAL | 0 refills | Status: DC
Start: 2019-07-12 — End: 2019-10-15

## 2019-07-12 NOTE — Progress Notes (Unsigned)
CRITICAL VALUE ALERT  Critical Value:  Total Bili 3.8 and potassium 2.7  Date & Time Notied:  07/12/19 at 1205  Provider Notified: Nicholas Lose, MD  Orders Received/Actions taken: MD notified and verbalized understanding. Pt states she has not been taking oral potassium at home and will resume 20 mEq BID

## 2019-07-12 NOTE — Patient Instructions (Signed)
Darbepoetin Alfa injection What is this medicine? DARBEPOETIN ALFA (dar be POE e tin AL fa) helps your body make more red blood cells. It is used to treat anemia caused by chronic kidney failure and chemotherapy. This medicine may be used for other purposes; ask your health care provider or pharmacist if you have questions. COMMON BRAND NAME(S): Aranesp What should I tell my health care provider before I take this medicine? They need to know if you have any of these conditions:  blood clotting disorders or history of blood clots  cancer patient not on chemotherapy  cystic fibrosis  heart disease, such as angina, heart failure, or a history of a heart attack  hemoglobin level of 12 g/dL or greater  high blood pressure  low levels of folate, iron, or vitamin B12  seizures  an unusual or allergic reaction to darbepoetin, erythropoietin, albumin, hamster proteins, latex, other medicines, foods, dyes, or preservatives  pregnant or trying to get pregnant  breast-feeding How should I use this medicine? This medicine is for injection into a vein or under the skin. It is usually given by a health care professional in a hospital or clinic setting. If you get this medicine at home, you will be taught how to prepare and give this medicine. Use exactly as directed. Take your medicine at regular intervals. Do not take your medicine more often than directed. It is important that you put your used needles and syringes in a special sharps container. Do not put them in a trash can. If you do not have a sharps container, call your pharmacist or healthcare provider to get one. A special MedGuide will be given to you by the pharmacist with each prescription and refill. Be sure to read this information carefully each time. Talk to your pediatrician regarding the use of this medicine in children. While this medicine may be used in children as young as 1 month of age for selected conditions, precautions do  apply. Overdosage: If you think you have taken too much of this medicine contact a poison control center or emergency room at once. NOTE: This medicine is only for you. Do not share this medicine with others. What if I miss a dose? If you miss a dose, take it as soon as you can. If it is almost time for your next dose, take only that dose. Do not take double or extra doses. What may interact with this medicine? Do not take this medicine with any of the following medications:  epoetin alfa This list may not describe all possible interactions. Give your health care provider a list of all the medicines, herbs, non-prescription drugs, or dietary supplements you use. Also tell them if you smoke, drink alcohol, or use illegal drugs. Some items may interact with your medicine. What should I watch for while using this medicine? Your condition will be monitored carefully while you are receiving this medicine. You may need blood work done while you are taking this medicine. This medicine may cause a decrease in vitamin B6. You should make sure that you get enough vitamin B6 while you are taking this medicine. Discuss the foods you eat and the vitamins you take with your health care professional. What side effects may I notice from receiving this medicine? Side effects that you should report to your doctor or health care professional as soon as possible:  allergic reactions like skin rash, itching or hives, swelling of the face, lips, or tongue  breathing problems  changes in   vision  chest pain  confusion, trouble speaking or understanding  feeling faint or lightheaded, falls  high blood pressure  muscle aches or pains  pain, swelling, warmth in the leg  rapid weight gain  severe headaches  sudden numbness or weakness of the face, arm or leg  trouble walking, dizziness, loss of balance or coordination  seizures (convulsions)  swelling of the ankles, feet, hands  unusually weak or  tired Side effects that usually do not require medical attention (report to your doctor or health care professional if they continue or are bothersome):  diarrhea  fever, chills (flu-like symptoms)  headaches  nausea, vomiting  redness, stinging, or swelling at site where injected This list may not describe all possible side effects. Call your doctor for medical advice about side effects. You may report side effects to FDA at 1-800-FDA-1088. Where should I keep my medicine? Keep out of the reach of children. Store in a refrigerator between 2 and 8 degrees C (36 and 46 degrees F). Do not freeze. Do not shake. Throw away any unused portion if using a single-dose vial. Throw away any unused medicine after the expiration date. NOTE: This sheet is a summary. It may not cover all possible information. If you have questions about this medicine, talk to your doctor, pharmacist, or health care provider.  2020 Elsevier/Gold Standard (2017-01-12 16:44:20)  

## 2019-07-20 ENCOUNTER — Other Ambulatory Visit: Payer: Self-pay | Admitting: Hematology and Oncology

## 2019-07-24 ENCOUNTER — Inpatient Hospital Stay: Payer: Medicare (Managed Care)

## 2019-07-31 ENCOUNTER — Emergency Department (HOSPITAL_BASED_OUTPATIENT_CLINIC_OR_DEPARTMENT_OTHER)
Admission: EM | Admit: 2019-07-31 | Discharge: 2019-07-31 | Disposition: A | Discharge status: Home / Self Care | Payer: Medicare (Managed Care) | Attending: Emergency Medicine | Admitting: Emergency Medicine

## 2019-07-31 ENCOUNTER — Emergency Department (HOSPITAL_BASED_OUTPATIENT_CLINIC_OR_DEPARTMENT_OTHER): Payer: Medicare (Managed Care)

## 2019-07-31 ENCOUNTER — Encounter (HOSPITAL_BASED_OUTPATIENT_CLINIC_OR_DEPARTMENT_OTHER): Payer: Self-pay

## 2019-07-31 ENCOUNTER — Other Ambulatory Visit: Payer: Self-pay

## 2019-07-31 DIAGNOSIS — J90 Pleural effusion, not elsewhere classified: Secondary | ICD-10-CM | POA: Insufficient documentation

## 2019-07-31 DIAGNOSIS — N183 Chronic kidney disease, stage 3 unspecified: Secondary | ICD-10-CM | POA: Diagnosis not present

## 2019-07-31 DIAGNOSIS — R079 Chest pain, unspecified: Secondary | ICD-10-CM | POA: Insufficient documentation

## 2019-07-31 DIAGNOSIS — R05 Cough: Secondary | ICD-10-CM | POA: Diagnosis present

## 2019-07-31 DIAGNOSIS — Z20822 Contact with and (suspected) exposure to covid-19: Secondary | ICD-10-CM | POA: Diagnosis not present

## 2019-07-31 DIAGNOSIS — E876 Hypokalemia: Secondary | ICD-10-CM | POA: Insufficient documentation

## 2019-07-31 DIAGNOSIS — R0602 Shortness of breath: Secondary | ICD-10-CM | POA: Insufficient documentation

## 2019-07-31 DIAGNOSIS — I129 Hypertensive chronic kidney disease with stage 1 through stage 4 chronic kidney disease, or unspecified chronic kidney disease: Secondary | ICD-10-CM | POA: Diagnosis not present

## 2019-07-31 DIAGNOSIS — I2510 Atherosclerotic heart disease of native coronary artery without angina pectoris: Secondary | ICD-10-CM | POA: Diagnosis not present

## 2019-07-31 DIAGNOSIS — R2243 Localized swelling, mass and lump, lower limb, bilateral: Secondary | ICD-10-CM | POA: Insufficient documentation

## 2019-07-31 DIAGNOSIS — Z87891 Personal history of nicotine dependence: Secondary | ICD-10-CM | POA: Diagnosis not present

## 2019-07-31 DIAGNOSIS — R059 Cough, unspecified: Secondary | ICD-10-CM

## 2019-07-31 LAB — CBC WITH DIFFERENTIAL/PLATELET
Abs Immature Granulocytes: 0.02 10*3/uL (ref 0.00–0.07)
Basophils Absolute: 0 10*3/uL (ref 0.0–0.1)
Basophils Relative: 1 %
Eosinophils Absolute: 0.2 10*3/uL (ref 0.0–0.5)
Eosinophils Relative: 3 %
HCT: 25.1 % — ABNORMAL LOW (ref 36.0–46.0)
Hemoglobin: 7.9 g/dL — ABNORMAL LOW (ref 12.0–15.0)
Immature Granulocytes: 0 %
Lymphocytes Relative: 17 %
Lymphs Abs: 0.8 10*3/uL (ref 0.7–4.0)
MCH: 28.7 pg (ref 26.0–34.0)
MCHC: 31.5 g/dL (ref 30.0–36.0)
MCV: 91.3 fL (ref 80.0–100.0)
Monocytes Absolute: 0.6 10*3/uL (ref 0.1–1.0)
Monocytes Relative: 12 %
Neutro Abs: 3.3 10*3/uL (ref 1.7–7.7)
Neutrophils Relative %: 67 %
Platelets: 56 10*3/uL — ABNORMAL LOW (ref 150–400)
RBC: 2.75 MIL/uL — ABNORMAL LOW (ref 3.87–5.11)
RDW: 18.7 % — ABNORMAL HIGH (ref 11.5–15.5)
WBC: 4.9 10*3/uL (ref 4.0–10.5)
nRBC: 0 % (ref 0.0–0.2)

## 2019-07-31 LAB — COMPREHENSIVE METABOLIC PANEL
ALT: 33 U/L (ref 0–44)
AST: 61 U/L — ABNORMAL HIGH (ref 15–41)
Albumin: 2.7 g/dL — ABNORMAL LOW (ref 3.5–5.0)
Alkaline Phosphatase: 178 U/L — ABNORMAL HIGH (ref 38–126)
Anion gap: 8 (ref 5–15)
BUN: 22 mg/dL (ref 8–23)
CO2: 19 mmol/L — ABNORMAL LOW (ref 22–32)
Calcium: 9.2 mg/dL (ref 8.9–10.3)
Chloride: 109 mmol/L (ref 98–111)
Creatinine, Ser: 1.34 mg/dL — ABNORMAL HIGH (ref 0.44–1.00)
GFR calc Af Amer: 49 mL/min — ABNORMAL LOW (ref >60)
GFR calc non Af Amer: 42 mL/min — ABNORMAL LOW (ref >60)
Glucose, Bld: 106 mg/dL — ABNORMAL HIGH (ref 70–99)
Potassium: 2.8 mmol/L — ABNORMAL LOW (ref 3.5–5.1)
Sodium: 136 mmol/L (ref 135–145)
Total Bilirubin: 6.7 mg/dL — ABNORMAL HIGH (ref 0.3–1.2)
Total Protein: 5.9 g/dL — ABNORMAL LOW (ref 6.5–8.1)

## 2019-07-31 LAB — BRAIN NATRIURETIC PEPTIDE: B Natriuretic Peptide: 442.8 pg/mL — ABNORMAL HIGH (ref 0.0–100.0)

## 2019-07-31 LAB — SARS CORONAVIRUS 2 BY RT PCR (HOSPITAL ORDER, PERFORMED IN ~~LOC~~ HOSPITAL LAB): SARS Coronavirus 2: NEGATIVE

## 2019-07-31 LAB — TROPONIN I (HIGH SENSITIVITY): Troponin I (High Sensitivity): 10 ng/L (ref <18)

## 2019-07-31 MED ORDER — AZITHROMYCIN 250 MG PO TABS
250.0000 mg | ORAL_TABLET | Freq: Every day | ORAL | 0 refills | Status: DC
Start: 2019-07-31 — End: 2019-07-31

## 2019-07-31 MED ORDER — POTASSIUM CHLORIDE ER 10 MEQ PO TBCR: 10.0000 meq | EXTENDED_RELEASE_TABLET | Freq: Every day | ORAL | 0 refills | Status: DC

## 2019-07-31 MED ORDER — AZITHROMYCIN 250 MG PO TABS
250.0000 mg | ORAL_TABLET | Freq: Every day | ORAL | 0 refills | Status: DC
Start: 2019-07-31 — End: 2019-08-06

## 2019-07-31 MED ORDER — POTASSIUM CHLORIDE CRYS ER 20 MEQ PO TBCR
40.0000 meq | EXTENDED_RELEASE_TABLET | Freq: Once | ORAL | Status: AC
  Administered 2019-07-31: 40 meq via ORAL
  Filled 2019-07-31: qty 2

## 2019-07-31 MED ORDER — POTASSIUM CHLORIDE ER 10 MEQ PO TBCR
10.0000 meq | EXTENDED_RELEASE_TABLET | Freq: Every day | ORAL | 0 refills | Status: DC
Start: 2019-07-31 — End: 2019-08-14

## 2019-07-31 MED FILL — AZITHROMYCIN 250 MG TABLET: 250 | 5 days supply | Qty: 6 | Fill #0

## 2019-07-31 MED FILL — POTASSIUM CL ER 10 MEQ TAB: 10 | 5 days supply | Qty: 5 | Fill #0

## 2019-07-31 NOTE — ED Notes (Signed)
This past Sunday developed a cold/ cough, had a couple vomiting spells, no nausea or vomiting today. Cough described as deep non-productive, followed with SOB, now has chest pain/aches at mid chest to left side. No fevers, eating not as well, but taking PO fluids well. Appears in no-distress at this time

## 2019-07-31 NOTE — Discharge Instructions (Addendum)
You were given a prescription for antibiotics. Please take the antibiotic prescription fully.   Please take potassium as directed.  Please follow up with your primary care provider within 5-7 days for re-evaluation of your symptoms. You may need to have a repeat chest xray if symptoms are persisting or getting worse. Please return to the emergency department for any new or worsening symptoms.

## 2019-07-31 NOTE — ED Notes (Signed)
Pt states she rec COVID vaccine in early April

## 2019-07-31 NOTE — ED Notes (Signed)
Denies any PND or orthopnea, states able to sleep without dypsnea

## 2019-07-31 NOTE — ED Provider Notes (Signed)
[facility] EMERGENCY DEPARTMENT Provider Note   CSN: [id number] Arrival date & time: 07/31/19  1234     History Chief Complaint  Patient presents with   Cough    Dana Watkins is a 64 y.o. female.  HPI   Pt is a 64 y/o female with ah /o HTN, Ddif, CAD, CKD, migraines, mitral regurg, pancytopenia, cirrhosis, who presents to the eD today for eval of a cough that started 3 days ago. Cough has been intermittently productive of yellow sputum. Denies hemoptysis. States she has some upper chest pain and pain to the left side of her chest. States pain is worse with deep inspiration. She also reports shortness of breath and nasal congestion. Denies wheezing, fevers, sore throat. Has h/o chronic intermittent vomiting, but has not had any episodes today. Denies diarrhea, abd pain.   Has some BLE swelling which is mild. Takes spironolactone for this intermittently. Denies recent surgery/admission, recent long travel, hormone use, personal hx of cancer, or hx of DVT/PE.    Past Medical History:  Diagnosis Date   Acute hypokalemia 11/26/2014   Arthritis 05/15/2015   Benign essential hypertension 05/15/2015   C. difficile colitis    CAD (coronary artery disease)    a. cath 04/2017: ""Diffuse, calcific CAD particularly in the distal RCA and proximal to mid LAD.  LAD disease is nonobstructive.  RCA disease is more severe but does not appear significant.  Given her lack of symptoms, would pursue medical therapy."   CKD (chronic kidney disease), stage III    Hypokalemia    Migraine headache 05/15/2015   Mild mitral regurgitation    Pancytopenia ([place])    Sepsis ([place]) 01/2017   Thrombocytopenia ([place])    a. chronic thrombocytopenia ([facility] - remotely saw hematology).   Vitamin D deficiency     Patient Active Problem List   Diagnosis Date Noted   Anemia due to stage 3 chronic kidney disease 04/16/2019   Fever 02/21/2019   Hypophosphatemia    Recurrent Clostridioides  difficile diarrhea 12/11/2018   Weakness 12/09/2018   Hyperbilirubinemia 10/31/2018   C. difficile diarrhea 10/31/2018   Other cirrhosis of liver ([place]) 85/02/7739   Systolic murmur at cardiac apex 10/31/2018   Sepsis ([place]) 10/30/2018   Normocytic anemia 10/23/2018   SIRS (systemic inflammatory response syndrome) ([place]) 10/16/2018   Acute renal failure superimposed on stage 3 chronic kidney disease ([place]) 10/16/2018   Acute lower UTI 10/16/2018   Elevated troponin 10/16/2018   Elevated d-dimer 10/16/2018   UTI (urinary tract infection) 06/25/2018   Community acquired pneumonia of right middle lobe of lung 06/09/2018   AKI (acute kidney injury) ([place]) 06/09/2018   GAD (generalized anxiety disorder) 11/06/2017   Abnormal findings on diagnostic imaging of heart and coronary circulation    Family history of coronary artery disease 05/09/2017   DOE (dyspnea on exertion) 05/09/2017   Coronary artery calcification seen on CT scan 03/08/2017   Acute bronchitis 02/06/2017   Dehydration    Diarrhea 02/03/2017   Nausea & vomiting 02/02/2017   Arthritis 05/15/2015   Benign essential hypertension 05/15/2015   Migraine headache 05/15/2015   Hypokalemia 11/26/2014   Alkaline phosphatase elevation 11/26/2014   Abnormal weight gain 11/26/2014   Chronic leukopenia 05/28/2014   Anemia of chronic illness 05/28/2014   Thrombocytopenia ([place]) 04/30/2013   Elevated liver function tests 04/30/2013    Past Surgical History:  Procedure Laterality Date   CESAREAN SECTION     24 years ago   ESOPHAGOGASTRODUODENOSCOPY (EGD)  WITH PROPOFOL N/A 06/13/2019   Procedure: ESOPHAGOGASTRODUODENOSCOPY (EGD) WITH PROPOFOL;  Surgeon: Arta Silence, MD;  Location: [facility];  Service: Endoscopy;  Laterality: N/A;  administration of one bag of platelets    LEFT HEART CATH AND CORONARY ANGIOGRAPHY N/A 05/12/2017   Procedure: LEFT HEART CATH AND CORONARY ANGIOGRAPHY;  Surgeon:  [name] Booze, MD;  Location: [facility];  Service: Cardiovascular;  Laterality: N/A;   tonsils and adneoids     as a child   ULTRASOUND GUIDANCE FOR VASCULAR ACCESS  05/12/2017   Procedure: Ultrasound Guidance For Vascular Access;  Surgeon: [name] Booze, MD;  Location: [facility];  Service: Cardiovascular;;     OB History   No obstetric history on file.     Family History  Problem Relation Age of Onset   Pernicious anemia Maternal Grandfather    Heart attack Maternal Grandfather    Atrial fibrillation Mother    Supraventricular tachycardia Father    Heart disease Paternal Grandfather     Social History   Tobacco Use   Smoking status: Former Smoker    Years: 2.00    Start date: 06/25/1968    Quit date: 05/16/2014    Years since quitting: 5.2   Smokeless tobacco: Never Used  Vaping Use   Vaping Use: Never used  Substance Use Topics   Alcohol use: Yes    Comment: occ   Drug use: No    Home Medications Prior to Admission medications   Medication Sig Start Date End Date Taking? Authorizing Provider  Ascorbic Acid (VITAMIN C) 1000 MG tablet Take 1,000 mg by mouth in the morning and at bedtime. Chewable    [provider]  azithromycin (ZITHROMAX Z-PAK) 250 MG tablet Take 1 tablet (250 mg total) by mouth daily. Take 2 tablets on the first day of treatment. Then take 1 tablet per day for the next four days. 07/31/19   Lima Chillemi S, PA-C  butalbital-acetaminophen-caffeine (FIORICET) 50-325-40 MG tablet Take 1 tablet by mouth every 6 (six) hours as needed for headache or migraine.  03/09/19   [provider]  folic acid (FOLVITE) 1 MG tablet Take 1 tablet (1 mg total) by mouth daily. 06/29/18   Raiford Noble Latif, DO  MAGNESIUM PO Take 400 mg by mouth daily.     [provider]  Multiple Vitamin (MULTIVITAMIN WITH MINERALS) TABS tablet Take 1 tablet by mouth daily. Patient taking differently: Take 1 tablet by  mouth daily. Woman's/ Gummie 06/29/18   Sheikh, Omair Latif, DO  omeprazole (PRILOSEC OTC) 20 MG tablet Take 1 tablet (20 mg total) by mouth daily. 06/13/19 06/12/20  Arta Silence, MD  ondansetron (ZOFRAN-ODT) 4 MG disintegrating tablet DISSOLVE 1 TABLET UNDER TONGUE AS NEEDED EVERY 8 HOURS 07/23/19   Nicholas Lose, MD  potassium chloride (KLOR-CON) 10 MEQ tablet Take 1 tablet (10 mEq total) by mouth daily for 5 days. 07/31/19 08/05/19  Helayne Metsker S, PA-C  spironolactone (ALDACTONE) 25 MG tablet Take 1 tablet (25 mg total) by mouth daily. 07/12/19   Nicholas Lose, MD    Allergies    Sulfa antibiotics  Review of Systems   Review of Systems  Constitutional: Negative for chills and fever.  HENT: Positive for congestion. Negative for ear pain and sore throat.   Eyes: Negative for pain and visual disturbance.  Respiratory: Positive for cough and shortness of breath. Negative for wheezing.   Cardiovascular: Positive for chest pain and leg swelling.  Gastrointestinal: Negative for  abdominal pain, constipation, diarrhea, nausea and vomiting.  Genitourinary: Negative for dysuria and hematuria.  Musculoskeletal: Negative for back pain.  Skin: Negative for rash.  Neurological: Negative for headaches.  All other systems reviewed and are negative.   Physical Exam Updated Vital Signs BP (!) 143/69 (BP Location: Left Arm)    Pulse 83    Temp 98 F (36.7 C) (Oral)    Resp 16    Ht 5' 8"  (1.727 m)    Wt 65.4 kg    SpO2 100%    BMI 21.91 kg/m   Physical Exam Vitals and nursing note reviewed.  Constitutional:      General: She is not in acute distress.    Appearance: She is well-developed.  HENT:     Head: Normocephalic and atraumatic.  Eyes:     General: Scleral icterus present.     Conjunctiva/sclera: Conjunctivae normal.  Cardiovascular:     Rate and Rhythm: Normal rate and regular rhythm.     Heart sounds: Normal heart sounds. No murmur heard.   Pulmonary:     Effort: Pulmonary effort is  normal. No respiratory distress.     Breath sounds: Normal breath sounds. No wheezing, rhonchi or rales.  Chest:     Chest wall: No tenderness.  Abdominal:     General: Bowel sounds are normal.     Palpations: Abdomen is soft.     Tenderness: There is no abdominal tenderness. There is no guarding or rebound.  Musculoskeletal:     Cervical back: Neck supple.     Comments: No significant BLE edema or calf TTP  Skin:    General: Skin is warm and dry.  Neurological:     Mental Status: She is alert.     ED Results / Procedures / Treatments   Labs (all labs ordered are listed, but only abnormal results are displayed) Labs Reviewed  CBC WITH DIFFERENTIAL/PLATELET - Abnormal; Notable for the following components:      Result Value   RBC 2.75 (*)    Hemoglobin 7.9 (*)    HCT 25.1 (*)    RDW 18.7 (*)    Platelets 56 (*)    All other components within normal limits  COMPREHENSIVE METABOLIC PANEL - Abnormal; Notable for the following components:   Potassium 2.8 (*)    CO2 19 (*)    Glucose, Bld 106 (*)    Creatinine, Ser 1.34 (*)    Total Protein 5.9 (*)    Albumin 2.7 (*)    AST 61 (*)    Alkaline Phosphatase 178 (*)    Total Bilirubin 6.7 (*)    GFR calc non Af Amer 42 (*)    GFR calc Af Amer 49 (*)    All other components within normal limits  BRAIN NATRIURETIC PEPTIDE - Abnormal; Notable for the following components:   B Natriuretic Peptide 442.8 (*)    All other components within normal limits  SARS CORONAVIRUS 2 BY RT PCR (HOSPITAL ORDER, [facility])  TROPONIN I (HIGH SENSITIVITY)  TROPONIN I (HIGH SENSITIVITY)    EKG EKG Interpretation  Date/Time:  Tuesday July 31 2019 14:25:47 EDT Ventricular Rate:  67 PR Interval:    QRS Duration: 116 QT Interval:  559 QTC Calculation: 591 R Axis:   32 Text Interpretation: Sinus rhythm Atrial premature complex Nonspecific intraventricular conduction delay Borderline repolarization abnormality  Confirmed by Gerlene Fee [phone]) on 07/31/2019 3:14:07 PM   Radiology DG Chest Portable 1 View  Result Date: 07/31/2019 CLINICAL DATA:  Cough. EXAM: PORTABLE CHEST 1 VIEW COMPARISON:  04/16/2019. FINDINGS: Mediastinum hilar structures normal. Cardiomegaly with mild pulmonary venous congestion. Mild left base atelectasis/infiltrate with moderate left pleural effusion. IMPRESSION: 1.  Cardiomegaly with mild pulmonary venous congestion. 2. Mild left base atelectasis/infiltrate with moderate left pleural effusion. Electronically Signed   By: Marcello Moores  Register   On: 07/31/2019 13:39    Procedures Procedures (including critical care time)  Medications Ordered in ED Medications  potassium chloride SA (KLOR-CON) CR tablet 40 mEq (has no administration in time range)    ED Course  I have reviewed the triage vital signs and the nursing notes.  Pertinent labs & imaging results that were available during my care of the patient were reviewed by me and considered in my medical decision making (see chart for details).    MDM Rules/Calculators/A&P                          64 year old female presenting for evaluation of cough and shortness of breath that started 3 days ago.  Also with some upper and left-sided chest pain.  Reviewed/interpreted chest x-ray -showed left base atelectasis with left pleural effusion.  Given patient's comorbidities and complaints of shortness of breath, will add labs to rule out ACS, anemia or significant electrolyte derangement.  CBC is without leukocytosis.  Anemia is present but stable from baseline.  Thrombocytopenia is also at baseline for patient CMP with hypokalemia 2.8.  Creatinine is at baseline. Liver enzymes at baseline.  Total bilirubin is elevated at 6.7, patient has had similar lie elevated bilirubins in the past.  - kdur given in ed. rx for kdur given as well (only takes spironolactone intermittently) Troponin negative  BNP is marginally elevated  EKG  Sinus rhythm Atrial premature complex Nonspecific intraventricular conduction delay Borderline repolarization abnormality  Patient states she has had bronchitis multiple times in the past and that sometimes it turns into pneumonia.  Suspect that she may have upper respiratory infection today which may have led to her pleural effusion.  Pleural effusion may also be secondary to her liver disease however will cover her with antibiotics in case this is secondary to infection.  Have lower suspicion for PE given reproducibility of symptoms on exam and chest x-ray with pleural effusion which explains symptoms.  Have advised close follow-up with PCP and strict return precautions.  Patient voiced understanding plan reasons to return but all questions answered patient stable for discharge  Seen in conjunction with Dr. Sedonia Small who personally evaluated the patient and is in agreement with the plan.  Final Clinical Impression(s) / ED Diagnoses Final diagnoses:  Cough  Pleural effusion  Hypokalemia    Rx / DC Orders ED Discharge Orders         Ordered    azithromycin (ZITHROMAX Z-PAK) 250 MG tablet  Daily     Discontinue  Reprint     07/31/19 1524    potassium chloride (KLOR-CON) 10 MEQ tablet  Daily     Discontinue  Reprint     07/31/19 [address], [place], PA-C 07/31/19 1530    Maudie Flakes, MD 07/31/19 [phone]

## 2019-07-31 NOTE — ED Triage Notes (Addendum)
Pt c/o day 4 of prod cough, congestion-states "when I get a cold like this it usually turns into bronchitis"-NAD-steady gait

## 2019-08-01 NOTE — Progress Notes (Signed)
Patient Care Team: Lawerance Cruel, MD as PCP - General (Family Medicine) [name], MD as PCP - Cardiology (Cardiology) Stephens Shire, MD as Referring Physician (Family Medicine)  DIAGNOSIS:    ICD-10-CM   1. Anemia due to stage 3 chronic kidney disease, unspecified whether stage 3a or 3b CKD  N18.30    D63.1     CHIEF COMPLIANT: Follow-up ofchronic anemia and thrombocytopenia  INTERVAL HISTORY: Dana Watkins is a 64 y.o. with above-mentioned history of chronic anemiasecondary to stage 3 chronic kidney diseaseand thrombocytopenia.She presents to the clinic todayfor follow-up. She tells me that after the Aranesp injection she had more energy.  Other than that she feels about the same.  ALLERGIES:  is allergic to sulfa antibiotics.  MEDICATIONS:  Current Outpatient Medications  Medication Sig Dispense Refill  . Ascorbic Acid (VITAMIN C) 1000 MG tablet Take 1,000 mg by mouth in the morning and at bedtime. Chewable    . azithromycin (ZITHROMAX Z-PAK) 250 MG tablet Take 1 tablet (250 mg total) by mouth daily. Take 2 tablets on the first day of treatment. Then take 1 tablet per day for the next four days. 6 tablet 0  . butalbital-acetaminophen-caffeine (FIORICET) 50-325-40 MG tablet Take 1 tablet by mouth every 6 (six) hours as needed for headache or migraine.     . folic acid (FOLVITE) 1 MG tablet Take 1 tablet (1 mg total) by mouth daily. 30 tablet 0  . MAGNESIUM PO Take 400 mg by mouth daily.     . Multiple Vitamin (MULTIVITAMIN WITH MINERALS) TABS tablet Take 1 tablet by mouth daily. (Patient taking differently: Take 1 tablet by mouth daily. Woman's/ Gummie) 30 tablet 0  . omeprazole (PRILOSEC OTC) 20 MG tablet Take 1 tablet (20 mg total) by mouth daily. 28 tablet 1  . ondansetron (ZOFRAN-ODT) 4 MG disintegrating tablet DISSOLVE 1 TABLET UNDER TONGUE AS NEEDED EVERY 8 HOURS 20 tablet 0  . potassium chloride (KLOR-CON) 10 MEQ tablet Take 1 tablet (10 mEq total)  by mouth daily for 5 days. 5 tablet 0  . spironolactone (ALDACTONE) 25 MG tablet Take 1 tablet (25 mg total) by mouth daily. 30 tablet 0   No current facility-administered medications for this visit.    PHYSICAL EXAMINATION: ECOG PERFORMANCE STATUS: 1 - Symptomatic but completely ambulatory  Vitals:   08/02/19 1015  BP: (!) 150/73  Pulse: 81  Resp: 17  Temp: 98 F (36.7 C)  SpO2: 100%   Filed Weights   08/02/19 1015  Weight: 143 lb 14.4 oz (65.3 kg)     LABORATORY DATA:  I have reviewed the data as listed CMP Latest Ref Rng & Units 07/31/2019 07/12/2019 07/05/2019  Glucose 70 - 99 mg/dL 106(H) 83 95  BUN 8 - 23 mg/dL 22 8 11   Creatinine 0.44 - 1.00 mg/dL 1.34(H) 1.36(H) 1.29(H)  Sodium 135 - 145 mmol/L 136 140 141  Potassium 3.5 - 5.1 mmol/L 2.8(L) 2.7(LL) 3.0(LL)  Chloride 98 - 111 mmol/L 109 113(H) 114(H)  CO2 22 - 32 mmol/L 19(L) 16(L) 17(L)  Calcium 8.9 - 10.3 mg/dL 9.2 9.1 9.2  Total Protein 6.5 - 8.1 g/dL 5.9(L) 6.7 6.0(L)  Total Bilirubin 0.3 - 1.2 mg/dL 6.7(H) 3.8(HH) 5.0(HH)  Alkaline Phos 38 - 126 U/L 178(H) 325(H) 262(H)  AST 15 - 41 U/L 61(H) 67(H) 64(H)  ALT 0 - 44 U/L 33 35 32    Lab Results  Component Value Date   WBC 4.9 07/31/2019   HGB  7.9 (L) 07/31/2019   HCT 25.1 (L) 07/31/2019   MCV 91.3 07/31/2019   PLT 56 (L) 07/31/2019   NEUTROABS 3.3 07/31/2019    ASSESSMENT & PLAN:  Anemia due to stage 3 chronic kidney disease Chronic normocytic anemia: GFR 36 Secondary to chronic kidney disease as well as secondary to cirrhosis of the liver.  Lab review 04/16/2019: Hemoglobin 8.1, MCV 96.3 platelets 57, erythropoietin 43.1, B12 1087, haptoglobin less than 10 (due to cirrhosis), total bilirubin 6.1, creatinine 1.3, LDH and folic acid normal  Plan: Aranesp injections at 500 mcg every 3 weeks started 07/12/2019  Liver cirrhosis: Patient continues to drink and I discussed with her that her child's class is 3 which translates into a medium life expectancy of  1 to 3 years.  Return to clinic every 3 weeks for Aranesp injections and every 6 weeks for follow-up with me.  Labs will be done every 3 weeks as well.   No orders of the defined types were placed in this encounter.  The patient has a good understanding of the overall plan. she agrees with it. she will call with any problems that may develop before the next visit here.  Total time spent: 20 mins including face to face time and time spent for planning, charting and coordination of care  Nicholas Lose, MD 08/02/2019  I, Cloyde Reams Dorshimer, am acting as scribe for Dr. Nicholas Lose.  I have reviewed the above documentation for accuracy and completeness, and I agree with the above.

## 2019-08-01 NOTE — Assessment & Plan Note (Signed)
Chronic normocytic anemia: GFR 36 Secondary to chronic kidney disease as well as secondary to cirrhosis of the liver.  Lab review 04/16/2019: Hemoglobin 8.1, MCV 96.3 platelets 57, erythropoietin 43.1, B12 1087, haptoglobin less than 10 (due to cirrhosis), total bilirubin 6.1, creatinine 1.3, LDH and folic acid normal  Plan: Monthly CBCs and transfusion if necessary. We requested nephrology consultation.  Today's hemoglobin is 7.8.  We will plan to transfuse 1 unit of PRBC. Aranesp injections at 500 mcg every 3 weeks started 07/12/2019  Liver cirrhosis: Patient continues to drink and I discussed with her that her child's class is 3 which translates into a medium life expectancy of 1 to 3 years.  I will see her once a month for follow-up.

## 2019-08-02 ENCOUNTER — Other Ambulatory Visit: Payer: Self-pay

## 2019-08-02 ENCOUNTER — Inpatient Hospital Stay: Payer: Medicare (Managed Care)

## 2019-08-02 ENCOUNTER — Inpatient Hospital Stay: Payer: Medicare (Managed Care) | Admitting: Hematology and Oncology

## 2019-08-02 DIAGNOSIS — D631 Anemia in chronic kidney disease: Secondary | ICD-10-CM | POA: Diagnosis not present

## 2019-08-02 DIAGNOSIS — N183 Chronic kidney disease, stage 3 unspecified: Secondary | ICD-10-CM

## 2019-08-02 MED ORDER — DARBEPOETIN ALFA 500 MCG/ML IJ SOSY
500.0000 ug | PREFILLED_SYRINGE | Freq: Once | INTRAMUSCULAR | Status: AC
  Administered 2019-08-02: 500 ug via SUBCUTANEOUS

## 2019-08-02 MED ORDER — DARBEPOETIN ALFA 500 MCG/ML IJ SOSY
PREFILLED_SYRINGE | INTRAMUSCULAR | Status: AC
  Filled 2019-08-02: qty 1

## 2019-08-02 NOTE — Progress Notes (Signed)
Per note on 7/21 it stated the patient need 1 unit PRBC patient wasn't aware and there was no appt. Informed Ronaldo Miyamoto and she instructed to have the patient wait. Made patient aware and she was placed in the main lobby.

## 2019-08-02 NOTE — Patient Instructions (Signed)
Darbepoetin Alfa injection What is this medicine? DARBEPOETIN ALFA (dar be POE e tin AL fa) helps your body make more red blood cells. It is used to treat anemia caused by chronic kidney failure and chemotherapy. This medicine may be used for other purposes; ask your health care provider or pharmacist if you have questions. COMMON BRAND NAME(S): Aranesp What should I tell my health care provider before I take this medicine? They need to know if you have any of these conditions:  blood clotting disorders or history of blood clots  cancer patient not on chemotherapy  cystic fibrosis  heart disease, such as angina, heart failure, or a history of a heart attack  hemoglobin level of 12 g/dL or greater  high blood pressure  low levels of folate, iron, or vitamin B12  seizures  an unusual or allergic reaction to darbepoetin, erythropoietin, albumin, hamster proteins, latex, other medicines, foods, dyes, or preservatives  pregnant or trying to get pregnant  breast-feeding How should I use this medicine? This medicine is for injection into a vein or under the skin. It is usually given by a health care professional in a hospital or clinic setting. If you get this medicine at home, you will be taught how to prepare and give this medicine. Use exactly as directed. Take your medicine at regular intervals. Do not take your medicine more often than directed. It is important that you put your used needles and syringes in a special sharps container. Do not put them in a trash can. If you do not have a sharps container, call your pharmacist or healthcare provider to get one. A special MedGuide will be given to you by the pharmacist with each prescription and refill. Be sure to read this information carefully each time. Talk to your pediatrician regarding the use of this medicine in children. While this medicine may be used in children as young as 1 month of age for selected conditions, precautions do  apply. Overdosage: If you think you have taken too much of this medicine contact a poison control center or emergency room at once. NOTE: This medicine is only for you. Do not share this medicine with others. What if I miss a dose? If you miss a dose, take it as soon as you can. If it is almost time for your next dose, take only that dose. Do not take double or extra doses. What may interact with this medicine? Do not take this medicine with any of the following medications:  epoetin alfa This list may not describe all possible interactions. Give your health care provider a list of all the medicines, herbs, non-prescription drugs, or dietary supplements you use. Also tell them if you smoke, drink alcohol, or use illegal drugs. Some items may interact with your medicine. What should I watch for while using this medicine? Your condition will be monitored carefully while you are receiving this medicine. You may need blood work done while you are taking this medicine. This medicine may cause a decrease in vitamin B6. You should make sure that you get enough vitamin B6 while you are taking this medicine. Discuss the foods you eat and the vitamins you take with your health care professional. What side effects may I notice from receiving this medicine? Side effects that you should report to your doctor or health care professional as soon as possible:  allergic reactions like skin rash, itching or hives, swelling of the face, lips, or tongue  breathing problems  changes in   vision  chest pain  confusion, trouble speaking or understanding  feeling faint or lightheaded, falls  high blood pressure  muscle aches or pains  pain, swelling, warmth in the leg  rapid weight gain  severe headaches  sudden numbness or weakness of the face, arm or leg  trouble walking, dizziness, loss of balance or coordination  seizures (convulsions)  swelling of the ankles, feet, hands  unusually weak or  tired Side effects that usually do not require medical attention (report to your doctor or health care professional if they continue or are bothersome):  diarrhea  fever, chills (flu-like symptoms)  headaches  nausea, vomiting  redness, stinging, or swelling at site where injected This list may not describe all possible side effects. Call your doctor for medical advice about side effects. You may report side effects to FDA at 1-800-FDA-1088. Where should I keep my medicine? Keep out of the reach of children. Store in a refrigerator between 2 and 8 degrees C (36 and 46 degrees F). Do not freeze. Do not shake. Throw away any unused portion if using a single-dose vial. Throw away any unused medicine after the expiration date. NOTE: This sheet is a summary. It may not cover all possible information. If you have questions about this medicine, talk to your doctor, pharmacist, or health care provider.  2020 Elsevier/Gold Standard (2017-01-12 16:44:20)  

## 2019-08-03 ENCOUNTER — Telehealth: Payer: Self-pay | Admitting: Hematology and Oncology

## 2019-08-03 NOTE — Telephone Encounter (Signed)
Scheduled appts per 7/22 los. Pt to get updated appt calendar at next visit  Per appt notes

## 2019-08-06 ENCOUNTER — Emergency Department (HOSPITAL_COMMUNITY): Payer: Medicare (Managed Care)

## 2019-08-06 ENCOUNTER — Inpatient Hospital Stay (HOSPITAL_COMMUNITY)
Admission: EM | Admit: 2019-08-06 | Discharge: 2019-08-14 | DRG: 280 | Disposition: A | Discharge status: Home / Self Care | Payer: Medicare (Managed Care) | Attending: Cardiovascular Disease | Admitting: Cardiovascular Disease

## 2019-08-06 ENCOUNTER — Other Ambulatory Visit: Payer: Self-pay

## 2019-08-06 DIAGNOSIS — Z882 Allergy status to sulfonamides status: Secondary | ICD-10-CM

## 2019-08-06 DIAGNOSIS — D61818 Other pancytopenia: Secondary | ICD-10-CM | POA: Diagnosis present

## 2019-08-06 DIAGNOSIS — Z9689 Presence of other specified functional implants: Secondary | ICD-10-CM

## 2019-08-06 DIAGNOSIS — J9621 Acute and chronic respiratory failure with hypoxia: Secondary | ICD-10-CM | POA: Diagnosis present

## 2019-08-06 DIAGNOSIS — I214 Non-ST elevation (NSTEMI) myocardial infarction: Secondary | ICD-10-CM | POA: Diagnosis not present

## 2019-08-06 DIAGNOSIS — D638 Anemia in other chronic diseases classified elsewhere: Secondary | ICD-10-CM | POA: Diagnosis present

## 2019-08-06 DIAGNOSIS — F1010 Alcohol abuse, uncomplicated: Secondary | ICD-10-CM | POA: Diagnosis present

## 2019-08-06 DIAGNOSIS — R918 Other nonspecific abnormal finding of lung field: Secondary | ICD-10-CM

## 2019-08-06 DIAGNOSIS — J90 Pleural effusion, not elsewhere classified: Secondary | ICD-10-CM | POA: Diagnosis not present

## 2019-08-06 DIAGNOSIS — Z9889 Other specified postprocedural states: Secondary | ICD-10-CM

## 2019-08-06 DIAGNOSIS — I249 Acute ischemic heart disease, unspecified: Secondary | ICD-10-CM | POA: Diagnosis present

## 2019-08-06 DIAGNOSIS — I313 Pericardial effusion (noninflammatory): Secondary | ICD-10-CM | POA: Diagnosis present

## 2019-08-06 DIAGNOSIS — Z20822 Contact with and (suspected) exposure to covid-19: Secondary | ICD-10-CM | POA: Diagnosis present

## 2019-08-06 DIAGNOSIS — D689 Coagulation defect, unspecified: Secondary | ICD-10-CM | POA: Diagnosis present

## 2019-08-06 DIAGNOSIS — J942 Hemothorax: Secondary | ICD-10-CM | POA: Diagnosis present

## 2019-08-06 DIAGNOSIS — I5022 Chronic systolic (congestive) heart failure: Secondary | ICD-10-CM | POA: Diagnosis present

## 2019-08-06 DIAGNOSIS — I083 Combined rheumatic disorders of mitral, aortic and tricuspid valves: Secondary | ICD-10-CM | POA: Diagnosis present

## 2019-08-06 DIAGNOSIS — M1990 Unspecified osteoarthritis, unspecified site: Secondary | ICD-10-CM | POA: Diagnosis present

## 2019-08-06 DIAGNOSIS — I2510 Atherosclerotic heart disease of native coronary artery without angina pectoris: Secondary | ICD-10-CM | POA: Diagnosis present

## 2019-08-06 DIAGNOSIS — J9811 Atelectasis: Secondary | ICD-10-CM | POA: Diagnosis present

## 2019-08-06 DIAGNOSIS — J9600 Acute respiratory failure, unspecified whether with hypoxia or hypercapnia: Secondary | ICD-10-CM

## 2019-08-06 DIAGNOSIS — Z87891 Personal history of nicotine dependence: Secondary | ICD-10-CM | POA: Diagnosis not present

## 2019-08-06 DIAGNOSIS — R778 Other specified abnormalities of plasma proteins: Secondary | ICD-10-CM

## 2019-08-06 DIAGNOSIS — K7031 Alcoholic cirrhosis of liver with ascites: Secondary | ICD-10-CM | POA: Diagnosis present

## 2019-08-06 DIAGNOSIS — E440 Moderate protein-calorie malnutrition: Secondary | ICD-10-CM | POA: Diagnosis present

## 2019-08-06 DIAGNOSIS — K766 Portal hypertension: Secondary | ICD-10-CM | POA: Diagnosis present

## 2019-08-06 DIAGNOSIS — E876 Hypokalemia: Secondary | ICD-10-CM | POA: Diagnosis present

## 2019-08-06 DIAGNOSIS — Z8249 Family history of ischemic heart disease and other diseases of the circulatory system: Secondary | ICD-10-CM | POA: Diagnosis not present

## 2019-08-06 DIAGNOSIS — Z6826 Body mass index (BMI) 26.0-26.9, adult: Secondary | ICD-10-CM

## 2019-08-06 DIAGNOSIS — E778 Other disorders of glycoprotein metabolism: Secondary | ICD-10-CM | POA: Diagnosis present

## 2019-08-06 DIAGNOSIS — D500 Iron deficiency anemia secondary to blood loss (chronic): Secondary | ICD-10-CM | POA: Diagnosis present

## 2019-08-06 DIAGNOSIS — J9620 Acute and chronic respiratory failure, unspecified whether with hypoxia or hypercapnia: Secondary | ICD-10-CM | POA: Diagnosis not present

## 2019-08-06 DIAGNOSIS — I130 Hypertensive heart and chronic kidney disease with heart failure and stage 1 through stage 4 chronic kidney disease, or unspecified chronic kidney disease: Secondary | ICD-10-CM | POA: Diagnosis present

## 2019-08-06 DIAGNOSIS — K209 Esophagitis, unspecified without bleeding: Secondary | ICD-10-CM | POA: Diagnosis present

## 2019-08-06 DIAGNOSIS — K3189 Other diseases of stomach and duodenum: Secondary | ICD-10-CM | POA: Diagnosis present

## 2019-08-06 DIAGNOSIS — Z79899 Other long term (current) drug therapy: Secondary | ICD-10-CM

## 2019-08-06 DIAGNOSIS — K7040 Alcoholic hepatic failure without coma: Secondary | ICD-10-CM | POA: Diagnosis present

## 2019-08-06 DIAGNOSIS — N1832 Chronic kidney disease, stage 3b: Secondary | ICD-10-CM | POA: Diagnosis present

## 2019-08-06 LAB — COMPREHENSIVE METABOLIC PANEL
ALT: 34 U/L (ref 0–44)
AST: 56 U/L — ABNORMAL HIGH (ref 15–41)
Albumin: 2.4 g/dL — ABNORMAL LOW (ref 3.5–5.0)
Alkaline Phosphatase: 180 U/L — ABNORMAL HIGH (ref 38–126)
Anion gap: 12 (ref 5–15)
BUN: 23 mg/dL (ref 8–23)
CO2: 17 mmol/L — ABNORMAL LOW (ref 22–32)
Calcium: 9.3 mg/dL (ref 8.9–10.3)
Chloride: 107 mmol/L (ref 98–111)
Creatinine, Ser: 1.69 mg/dL — ABNORMAL HIGH (ref 0.44–1.00)
GFR calc Af Amer: 37 mL/min — ABNORMAL LOW (ref >60)
GFR calc non Af Amer: 32 mL/min — ABNORMAL LOW (ref >60)
Glucose, Bld: 149 mg/dL — ABNORMAL HIGH (ref 70–99)
Potassium: 3.1 mmol/L — ABNORMAL LOW (ref 3.5–5.1)
Sodium: 136 mmol/L (ref 135–145)
Total Bilirubin: 6.1 mg/dL — ABNORMAL HIGH (ref 0.3–1.2)
Total Protein: 5.1 g/dL — ABNORMAL LOW (ref 6.5–8.1)

## 2019-08-06 LAB — TROPONIN I (HIGH SENSITIVITY)
Troponin I (High Sensitivity): 245 ng/L (ref <18)
Troponin I (High Sensitivity): 132 ng/L (ref <18)

## 2019-08-06 LAB — SARS CORONAVIRUS 2 BY RT PCR (HOSPITAL ORDER, PERFORMED IN ~~LOC~~ HOSPITAL LAB): SARS Coronavirus 2: NEGATIVE

## 2019-08-06 MED ORDER — ONDANSETRON HCL 4 MG/2ML IJ SOLN
4.0000 mg | Freq: Four times a day (QID) | INTRAMUSCULAR | Status: DC | PRN
  Administered 2019-08-07 – 2019-08-14 (×7): 4 mg via INTRAVENOUS
  Filled 2019-08-06 (×7): qty 2

## 2019-08-06 MED ORDER — IOHEXOL 350 MG/ML SOLN
100.0000 mL | Freq: Once | INTRAVENOUS | Status: AC | PRN
  Administered 2019-08-06: 100 mL via INTRAVENOUS

## 2019-08-06 MED ORDER — ADULT MULTIVITAMIN W/MINERALS CH
1.0000 | ORAL_TABLET | Freq: Every day | ORAL | Status: DC
  Administered 2019-08-07 – 2019-08-14 (×8): 1 via ORAL
  Filled 2019-08-06 (×8): qty 1

## 2019-08-06 MED ORDER — SODIUM CHLORIDE (PF) 0.9 % IJ SOLN
INTRAMUSCULAR | Status: AC
  Administered 2019-08-06: 10 mL
  Filled 2019-08-06: qty 50

## 2019-08-06 MED ORDER — ASPIRIN 81 MG PO CHEW
324.0000 mg | CHEWABLE_TABLET | Freq: Once | ORAL | Status: AC
  Administered 2019-08-06: 324 mg via ORAL
  Filled 2019-08-06: qty 4

## 2019-08-06 MED ORDER — FOLIC ACID 1 MG PO TABS
1.0000 mg | ORAL_TABLET | Freq: Every day | ORAL | Status: DC
  Administered 2019-08-07 – 2019-08-14 (×8): 1 mg via ORAL
  Filled 2019-08-06 (×8): qty 1

## 2019-08-06 MED ORDER — MAGNESIUM OXIDE 400 (241.3 MG) MG PO TABS
400.0000 mg | ORAL_TABLET | Freq: Every day | ORAL | Status: DC
  Administered 2019-08-07 – 2019-08-14 (×8): 400 mg via ORAL
  Filled 2019-08-06 (×8): qty 1

## 2019-08-06 MED ORDER — POTASSIUM CHLORIDE CRYS ER 10 MEQ PO TBCR
20.0000 meq | EXTENDED_RELEASE_TABLET | Freq: Two times a day (BID) | ORAL | Status: DC
  Administered 2019-08-07 – 2019-08-08 (×4): 20 meq via ORAL
  Filled 2019-08-06 (×5): qty 2

## 2019-08-06 MED ORDER — ASPIRIN EC 81 MG PO TBEC
81.0000 mg | DELAYED_RELEASE_TABLET | Freq: Every day | ORAL | Status: DC
  Filled 2019-08-06: qty 1

## 2019-08-06 MED ORDER — ACETAMINOPHEN 325 MG PO TABS
650.0000 mg | ORAL_TABLET | ORAL | Status: DC | PRN
  Administered 2019-08-08: 650 mg via ORAL
  Filled 2019-08-06: qty 2

## 2019-08-06 MED ORDER — ONDANSETRON HCL 4 MG/2ML IJ SOLN
4.0000 mg | Freq: Once | INTRAMUSCULAR | Status: AC
  Administered 2019-08-06: 4 mg via INTRAVENOUS
  Filled 2019-08-06: qty 2

## 2019-08-06 MED ORDER — NITROGLYCERIN 0.4 MG SL SUBL
0.4000 mg | SUBLINGUAL_TABLET | SUBLINGUAL | Status: DC | PRN
  Administered 2019-08-06: 0.4 mg via SUBLINGUAL
  Filled 2019-08-06: qty 1

## 2019-08-06 MED ORDER — ONDANSETRON HCL 4 MG/2ML IJ SOLN
4.0000 mg | Freq: Once | INTRAMUSCULAR | Status: AC
  Administered 2019-08-07: 4 mg via INTRAVENOUS
  Filled 2019-08-06: qty 2

## 2019-08-06 MED ORDER — SODIUM CHLORIDE 0.9 % IV SOLN: INTRAVENOUS | Status: DC

## 2019-08-06 MED ORDER — SODIUM CHLORIDE 0.9 % IV BOLUS
500.0000 mL | Freq: Once | INTRAVENOUS | Status: AC
  Administered 2019-08-06: 500 mL via INTRAVENOUS

## 2019-08-06 NOTE — ED Triage Notes (Signed)
[facility]. -  Per EMS- Pt highly anxious on EMS arrival to home, c/o [facility], recently dx with bronchitis, finished z-pac Friday. C/o Chest wall pain with cough. Tachycardic initially at 120, on arrival to ED HR 108.   O2 initially 90% on RA; increased to 96% on 3L O2; c/o nausea-->Received 4 mg Zofran; 400 mL NaCl  22g L hand

## 2019-08-06 NOTE — ED Notes (Signed)
Date and time results received: 08/06/19 9:59 PM  (use smartphrase ".now" to insert current time)  Test: Troponin Critical Value: 245  Name of Provider Notified: Dr. Ron Parker  Orders Received? Or Actions Taken?:

## 2019-08-06 NOTE — ED Provider Notes (Signed)
[facility] DEPT Provider Note   CSN: [id number] Arrival date & time: 08/06/19  1831     History Chief Complaint  Patient presents with  . Shortness of Breath    Dana Watkins is a 64 y.o. female.   Shortness of Breath Severity:  Severe Onset quality:  Gradual Timing:  Constant Progression:  Worsening Chronicity:  New Context comment:  Was told she has bronchitis Relieved by:  Nothing Worsened by:  Nothing Ineffective treatments: abx  Associated symptoms: chest pain, cough and fever   Associated symptoms: no headaches, no rash and no vomiting        Past Medical History:  Diagnosis Date  . Acute hypokalemia 11/26/2014  . Arthritis 05/15/2015  . Benign essential hypertension 05/15/2015  . C. difficile colitis   . CAD (coronary artery disease)    a. cath 04/2017: ""Diffuse, calcific CAD particularly in the distal RCA and proximal to mid LAD.  LAD disease is nonobstructive.  RCA disease is more severe but does not appear significant.  Given her lack of symptoms, would pursue medical therapy."  . CKD (chronic kidney disease), stage III   . Hypokalemia   . Migraine headache 05/15/2015  . Mild mitral regurgitation   . Pancytopenia ([place])   . Sepsis ([place]) 01/2017  . Thrombocytopenia ([place])    a. chronic thrombocytopenia ([facility] - remotely saw hematology).  . Vitamin D deficiency     Patient Active Problem List   Diagnosis Date Noted  . Anemia due to stage 3 chronic kidney disease 04/16/2019  . Fever 02/21/2019  . Hypophosphatemia   . Recurrent Clostridioides difficile diarrhea 12/11/2018  . Weakness 12/09/2018  . Hyperbilirubinemia 10/31/2018  . C. difficile diarrhea 10/31/2018  . Other cirrhosis of liver ([place]) 10/31/2018  . Systolic murmur at cardiac apex 10/31/2018  . Sepsis ([place]) 10/30/2018  . Normocytic anemia 10/23/2018  . SIRS (systemic inflammatory response syndrome) ([place]) 10/16/2018  . Acute renal failure superimposed on stage 3  chronic kidney disease ([place]) 10/16/2018  . Acute lower UTI 10/16/2018  . Elevated troponin 10/16/2018  . Elevated d-dimer 10/16/2018  . UTI (urinary tract infection) 06/25/2018  . Community acquired pneumonia of right middle lobe of lung 06/09/2018  . AKI (acute kidney injury) ([place]) 06/09/2018  . GAD (generalized anxiety disorder) 11/06/2017  . Abnormal findings on diagnostic imaging of heart and coronary circulation   . Family history of coronary artery disease 05/09/2017  . DOE (dyspnea on exertion) 05/09/2017  . Coronary artery calcification seen on CT scan 03/08/2017  . Acute bronchitis 02/06/2017  . Dehydration   . Diarrhea 02/03/2017  . Nausea & vomiting 02/02/2017  . Arthritis 05/15/2015  . Benign essential hypertension 05/15/2015  . Migraine headache 05/15/2015  . Hypokalemia 11/26/2014  . Alkaline phosphatase elevation 11/26/2014  . Abnormal weight gain 11/26/2014  . Chronic leukopenia 05/28/2014  . Anemia of chronic illness 05/28/2014  . Thrombocytopenia ([place]) 04/30/2013  . Elevated liver function tests 04/30/2013    Past Surgical History:  Procedure Laterality Date  . CESAREAN SECTION     24 years ago  . ESOPHAGOGASTRODUODENOSCOPY (EGD) WITH PROPOFOL N/A 06/13/2019   Procedure: ESOPHAGOGASTRODUODENOSCOPY (EGD) WITH PROPOFOL;  Surgeon: Arta Silence, MD;  Location: [facility] END[facility];  Service: Endoscopy;  Laterality: N/A;  administration of one bag of platelets   . LEFT HEART CATH AND CORONARY ANGIOGRAPHY N/A 05/12/2017   Procedure: LEFT HEART CATH AND CORONARY ANGIOGRAPHY;  Surgeon: [name] Booze, MD;  Location: [facility];  Service: Cardiovascular;  Laterality: N/A;  . tonsils and adneoids     as a child  . ULTRASOUND GUIDANCE FOR VASCULAR ACCESS  05/12/2017   Procedure: Ultrasound Guidance For Vascular Access;  Surgeon: [name] Booze, MD;  Location: [facility];  Service: Cardiovascular;;     OB History   No obstetric history on file.      Family History  Problem Relation Age of Onset  . Pernicious anemia Maternal Grandfather   . Heart attack Maternal Grandfather   . Atrial fibrillation Mother   . Supraventricular tachycardia Father   . Heart disease Paternal Grandfather     Social History   Tobacco Use  . Smoking status: Former Smoker    Years: 2.00    Start date: 06/25/1968    Quit date: 05/16/2014    Years since quitting: 5.2  . Smokeless tobacco: Never Used  Vaping Use  . Vaping Use: Never used  Substance Use Topics  . Alcohol use: Yes    Comment: occ  . Drug use: No    Home Medications Prior to Admission medications   Medication Sig Start Date End Date Taking? Authorizing Provider  Ascorbic Acid (VITAMIN C) 1000 MG tablet Take 1,000 mg by mouth in the morning and at bedtime. Chewable   Yes [provider]  butalbital-acetaminophen-caffeine (FIORICET) 50-325-40 MG tablet Take 1 tablet by mouth every 6 (six) hours as needed for headache or migraine.  03/09/19  Yes [provider]  ciprofloxacin (CIPRO) 500 MG tablet Take 500 mg by mouth daily.  08/02/19  Yes [provider]  folic acid (FOLVITE) 1 MG tablet Take 1 tablet (1 mg total) by mouth daily. 06/29/18  Yes Sheikh, Omair Latif, DO  MAGNESIUM PO Take 400 mg by mouth daily.    Yes [provider]  Multiple Vitamin (MULTIVITAMIN WITH MINERALS) TABS tablet Take 1 tablet by mouth daily. Patient taking differently: Take 1 tablet by mouth daily. Woman's/ Gummie 06/29/18  Yes Sheikh, Omair Latif, DO  ondansetron (ZOFRAN-ODT) 4 MG disintegrating tablet DISSOLVE 1 TABLET UNDER TONGUE AS NEEDED EVERY 8 HOURS Patient taking differently: Take 4 mg by mouth every 8 (eight) hours as needed for nausea or vomiting.  07/23/19  Yes Nicholas Lose, MD  potassium chloride (KLOR-CON) 10 MEQ tablet Take 10 mEq by mouth daily.   Yes [provider]  spironolactone (ALDACTONE) 25 MG tablet Take 1 tablet (25 mg total) by mouth  daily. Patient taking differently: Take 25 mg by mouth daily as needed (swelling).  07/12/19  Yes Nicholas Lose, MD  azithromycin (ZITHROMAX Z-PAK) 250 MG tablet Take 1 tablet (250 mg total) by mouth daily. Take 2 tablets on the first day of treatment. Then take 1 tablet per day for the next four days. Patient not taking: Reported on 08/06/2019 07/31/19   Couture, Cortni S, PA-C  omeprazole (PRILOSEC OTC) 20 MG tablet Take 1 tablet (20 mg total) by mouth daily. Patient not taking: Reported on 08/06/2019 06/13/19 06/12/20  Arta Silence, MD  potassium chloride (KLOR-CON) 10 MEQ tablet Take 1 tablet (10 mEq total) by mouth daily for 5 days. 07/31/19 08/05/19  Couture, Cortni S, PA-C  traZODone (DESYREL) 50 MG tablet  07/20/19   [provider]    Allergies    Sulfa antibiotics  Review of Systems   Review of Systems  Constitutional: Positive for chills, fatigue and fever.  HENT: Negative for congestion and rhinorrhea.   Respiratory: Positive for cough, chest tightness and shortness  of breath.   Cardiovascular: Positive for chest pain. Negative for palpitations.  Gastrointestinal: Negative for diarrhea, nausea and vomiting.  Genitourinary: Negative for difficulty urinating and dysuria.  Musculoskeletal: Negative for arthralgias and back pain.  Skin: Negative for rash and wound.  Neurological: Negative for light-headedness and headaches.  Hematological: Bruises/bleeds easily.    Physical Exam Updated Vital Signs BP (!) 132/70 (BP Location: Left Arm)   Pulse (!) 111   Temp 98 F (36.7 C) (Oral)   Resp (!) 26   SpO2 95%   Physical Exam Vitals and nursing note reviewed. Exam conducted with a chaperone present.  Constitutional:      General: She is not in acute distress.    Appearance: Normal appearance.  HENT:     Head: Normocephalic and atraumatic.     Nose: No rhinorrhea.  Eyes:     General:        Right eye: No discharge.        Left eye: No discharge.     Conjunctiva/sclera:  Conjunctivae normal.  Cardiovascular:     Rate and Rhythm: Regular rhythm. Tachycardia present.  Pulmonary:     Effort: Pulmonary effort is normal. Tachypnea present. No respiratory distress.     Breath sounds: No stridor. No decreased breath sounds or wheezing.  Abdominal:     General: Abdomen is flat. There is no distension.     Palpations: Abdomen is soft.  Musculoskeletal:        General: No tenderness or signs of injury.     Right lower leg: No edema.     Left lower leg: No edema.  Skin:    General: Skin is warm and dry.  Neurological:     General: No focal deficit present.     Mental Status: She is alert. Mental status is at baseline.     Motor: No weakness.  Psychiatric:        Mood and Affect: Mood normal.        Behavior: Behavior normal.     ED Results / Procedures / Treatments   Labs (all labs ordered are listed, but only abnormal results are displayed) Labs Reviewed  COMPREHENSIVE METABOLIC PANEL - Abnormal; Notable for the following components:      Result Value   Potassium 3.1 (*)    CO2 17 (*)    Glucose, Bld 149 (*)    Creatinine, Ser 1.69 (*)    Total Protein 5.1 (*)    Albumin 2.4 (*)    AST 56 (*)    Alkaline Phosphatase 180 (*)    Total Bilirubin 6.1 (*)    GFR calc non Af Amer 32 (*)    GFR calc Af Amer 37 (*)    All other components within normal limits  TROPONIN I (HIGH SENSITIVITY) - Abnormal; Notable for the following components:   Troponin I (High Sensitivity) 245 (*)    All other components within normal limits  SARS CORONAVIRUS 2 BY RT PCR (HOSPITAL ORDER, [facility])  CBC WITH DIFFERENTIAL/PLATELET  BRAIN NATRIURETIC PEPTIDE  TROPONIN I (HIGH SENSITIVITY)    EKG EKG Interpretation  Date/Time:  Monday August 06 2019 19:52:59 EDT Ventricular Rate:  100 PR Interval:    QRS Duration: 120 QT Interval:  419 QTC Calculation: 541 R Axis:   55 Text Interpretation: Sinus tachycardia Nonspecific intraventricular  conduction delay Nonspecific repol abnormality, diffuse leads Confirmed by Dewaine Conger [phone]) on 08/06/2019 7:57:51 PM   Radiology No results  found.  Procedures Procedures (including critical care time)  Medications Ordered in ED Medications  nitroGLYCERIN (NITROSTAT) SL tablet 0.4 mg (has no administration in time range)  sodium chloride 0.9 % bolus 500 mL (500 mLs Intravenous New Bag/Given 08/06/19 2108)  aspirin chewable tablet 324 mg (324 mg Oral Given 08/06/19 2108)  ondansetron (ZOFRAN) injection 4 mg (4 mg Intravenous Given 08/06/19 2114)    ED Course  I have reviewed the triage vital signs and the nursing notes.  Pertinent labs & imaging results that were available during my care of the patient were reviewed by me and considered in my medical decision making (see chart for details).    MDM Rules/Calculators/A&P                          Worsening shortness of breath.  Seen for the same.  No improvement with antibiotic therapy.  Pain with deep breathing, radiates to the back.  Orally.  Normal blood pressure.  He is afebrile here but is taking Aleve for pain control.  Lungs equal and symmetric bilaterally.  She will get a PET scan here today as she is tachycardic.  Her EKG will be done.  She will get cardiac biomarkers and routine labs.  EKG with concerning new findings, aspirin given.  First troponin is elevated.  Kidney function has mildly worse than previous at 1.69.  Potassium is better than usual at 3.1.  She will likely need admission for further management of elevated troponins.  We will repeat an EKG.  She did receive aspirin 324 upon the discovery of EKG changes.  Cardiology is consulted for admision. Nitro is given for pain, still waiting for CTA.  Pt care was handed off to on coming provider at 2300.  Complete history and physical and current plan have been communicated.  Please refer to their note for the remainder of ED care and ultimate disposition.    Final  Clinical Impression(s) / ED Diagnoses Final diagnoses:  Elevated troponin  NSTEMI (non-ST elevated myocardial infarction) [facility])    Rx / DC Orders ED Discharge Orders    None       Breck Coons, MD 08/06/19 2314

## 2019-08-06 NOTE — H&P (Signed)
Referring Physician: Lesle Reek, MD   Dana Watkins is an 64 y.o. female.                       Chief Complaint: Chest pain  HPI: 64 years old female with PMH of CAD, arthritis, liver cirrhosis, CKD, stage IIIb, hypokalemia,vitamin D deficiency and pancytopenia has recent ER visit for acute bronchitis. Her chest pain is pleuritic, under left breast, non-radiating but with shortness of breath with activity. She has mild vascular congestion on chest x-ray. Her EKG shows SR with diffuse ST-T changes. Her HS-Troponin I has peaked at 245 ng. Her BNP is elevated at 442.8 pg. Her CBC with diff is pending but 1 week back her Hgb was 7.9 gm, WBC count of 4.9K and platelets count of 56K. Her potassium is 3.1 meq. Her Creatinine is 1.69. Her Total bilirubin is elevated at 6.1 mg.  Past Medical History:  Diagnosis Date  . Acute hypokalemia 11/26/2014  . Arthritis 05/15/2015  . Benign essential hypertension 05/15/2015  . C. difficile colitis   . CAD (coronary artery disease)    a. cath 04/2017: ""Diffuse, calcific CAD particularly in the distal RCA and proximal to mid LAD.  LAD disease is nonobstructive.  RCA disease is more severe but does not appear significant.  Given her lack of symptoms, would pursue medical therapy."  . CKD (chronic kidney disease), stage III   . Hypokalemia   . Migraine headache 05/15/2015  . Mild mitral regurgitation   . Pancytopenia ([place])   . Sepsis ([place]) 01/2017  . Thrombocytopenia ([place])    a. chronic thrombocytopenia ([facility] - remotely saw hematology).  . Vitamin D deficiency       Past Surgical History:  Procedure Laterality Date  . CESAREAN SECTION     24 years ago  . ESOPHAGOGASTRODUODENOSCOPY (EGD) WITH PROPOFOL N/A 06/13/2019   Procedure: ESOPHAGOGASTRODUODENOSCOPY (EGD) WITH PROPOFOL;  Surgeon: Arta Silence, MD;  [facility];  Service: Endoscopy;  Laterality: N/A;  administration of one bag of platelets   . LEFT HEART CATH AND CORONARY ANGIOGRAPHY N/A 05/12/2017    Procedure: LEFT HEART CATH AND CORONARY ANGIOGRAPHY;  Surgeon: [name] Booze, MD;  Location: [facility];  Service: Cardiovascular;  Laterality: N/A;  . tonsils and adneoids     as a child  . ULTRASOUND GUIDANCE FOR VASCULAR ACCESS  05/12/2017   Procedure: Ultrasound Guidance For Vascular Access;  Surgeon: [name] Booze, MD;  Location: [facility];  Service: Cardiovascular;;    Family History  Problem Relation Age of Onset  . Pernicious anemia Maternal Grandfather   . Heart attack Maternal Grandfather   . Atrial fibrillation Mother   . Supraventricular tachycardia Father   . Heart disease Paternal Grandfather    Social History:  reports that she quit smoking about 5 years ago. She started smoking about 51 years ago. She quit after 2.00 years of use. She has never used smokeless tobacco. She reports current alcohol use. She reports that she does not use drugs.  Allergies:  Allergies  Allergen Reactions  . Sulfa Antibiotics Nausea And Vomiting and Rash    (Not in a hospital admission)   Results for orders placed or performed during the hospital encounter of 08/06/19 (from the past 48 hour(s))  Comprehensive metabolic panel     Status: Abnormal   Collection Time: 08/06/19  8:55 PM  Result Value Ref Range   Sodium 136 135 - 145 mmol/L  Potassium 3.1 (L) 3.5 - 5.1 mmol/L   Chloride 107 98 - 111 mmol/L   CO2 17 (L) 22 - 32 mmol/L   Glucose, Bld 149 (H) 70 - 99 mg/dL    Comment: Glucose reference range applies only to samples taken after fasting for at least 8 hours.   BUN 23 8 - 23 mg/dL   Creatinine, Ser 1.69 (H) 0.44 - 1.00 mg/dL   Calcium 9.3 8.9 - 10.3 mg/dL   Total Protein 5.1 (L) 6.5 - 8.1 g/dL   Albumin 2.4 (L) 3.5 - 5.0 g/dL   AST 56 (H) 15 - 41 U/L   ALT 34 0 - 44 U/L   Alkaline Phosphatase 180 (H) 38 - 126 U/L   Total Bilirubin 6.1 (H) 0.3 - 1.2 mg/dL   GFR calc non Af Amer 32 (L) >60 mL/min   GFR calc Af Amer 37 (L) >60 mL/min   Anion  gap 12 5 - 15    Comment: Performed at [facility], [place] [address], [place], [place] [address]  Troponin I (High Sensitivity)     Status: Abnormal   Collection Time: 08/06/19  8:55 PM  Result Value Ref Range   Troponin I (High Sensitivity) 245 (HH) <18 ng/L    Comment: CRITICAL RESULT CALLED TO, READ BACK BY AND VERIFIED WITH: RN I HODGES AT 2159 08/06/19 CRUICKSHANK A (NOTE) Elevated high sensitivity troponin I (hsTnI) values and significant  changes across serial measurements may suggest ACS but many other  chronic and acute conditions are known to elevate hsTnI results.  Refer to the Links section for chest pain algorithms and additional  guidance. Performed at [facility], [place] [address] [place], [place] [address]   SARS Coronavirus 2 by RT PCR (hospital order, performed in [facility] hospital lab) Nasopharyngeal Nasopharyngeal Swab     Status: None   Collection Time: 08/06/19  9:20 PM   Specimen: Nasopharyngeal Swab  Result Value Ref Range   SARS Coronavirus 2 NEGATIVE NEGATIVE    Comment: (NOTE) SARS-CoV-2 target nucleic acids are NOT DETECTED.  The SARS-CoV-2 RNA is generally detectable in upper and lower respiratory specimens during the acute phase of infection. The lowest concentration of SARS-CoV-2 viral copies this assay can detect is 250 copies / mL. A negative result does not preclude SARS-CoV-2 infection and should not be used as the sole basis for treatment or other patient management decisions.  A negative result may occur with improper specimen collection / handling, submission of specimen other than nasopharyngeal swab, presence of viral mutation(s) within the areas targeted by this assay, and inadequate number of viral copies (<250 copies / mL). A negative result must be combined with clinical observations, patient history, and epidemiological information.  Fact Sheet for Patients:    StrictlyIdeas.no  Fact Sheet for Healthcare Providers: BankingDealers.co.za  This test is not yet approved or  cleared by the [place] FDA and has been authorized for detection and/or diagnosis of SARS-CoV-2 by FDA under an Emergency Use Authorization (EUA).  This EUA will remain in effect (meaning this test can be used) for the duration of the COVID-19 declaration under Section 564(b)(1) of the Act, 21 U.S.C. section 360bbb-3(b)(1), unless the authorization is terminated or revoked sooner.  Performed at [facility], [place] [address], [place], [place] [address]   Troponin I (High Sensitivity)     Status: Abnormal   Collection Time: 08/06/19  9:40 PM  Result Value Ref Range  Troponin I (High Sensitivity) 132 (HH) <18 ng/L    Comment: DELTA CHECK NOTED CRITICAL RESULT CALLED TO, READ BACK BY AND VERIFIED WITH: ILENE HODGES @ 2224 ON 08/06/19 C VARNER (NOTE) Elevated high sensitivity troponin I (hsTnI) values and significant  changes across serial measurements may suggest ACS but many other  chronic and acute conditions are known to elevate hsTnI results.  Refer to the Links section for chest pain algorithms and additional  guidance. Performed at [facility], [place] [address], [place], [place] [address]    No results found.  Review Of Systems Constitutional: No fever, chills, chronic weight loss. Eyes: No vision change, wears glasses. No discharge or pain. Ears: No hearing loss, No tinnitus. Respiratory: No asthma, COPD, pneumonias. Positive shortness of breath. No hemoptysis. Cardiovascular: Positive chest pain, palpitation, leg edema. Gastrointestinal: No nausea, vomiting, diarrhea, constipation. No GI bleed. No hepatitis. Genitourinary: No dysuria, hematuria, kidney stone. No incontinance. CKD. Neurological: No headache, stroke, seizures.  Psychiatry: No psych facility admission for  anxiety, depression, suicide. No detox. Skin: No rash. Musculoskeletal: No joint pain, fibromyalgia. No neck pain, back pain. Lymphadenopathy: No lymphadenopathy. Hematology: No anemia or easy bruising.   Blood pressure (!) 120/62, pulse 98, temperature 98 F (36.7 C), temperature source Oral, resp. rate 18, SpO2 95 %. There is no height or weight on file to calculate BMI. General appearance: alert, cooperative, appears stated age and mild respiratory distress Head: Normocephalic, atraumatic. Eyes: Blue eyes, pale conjunctiva, corneas clear. PERRL, EOM's intact. Sclera with minimal icterus. Neck: No adenopathy, no carotid bruit, no JVD, supple, symmetrical, trachea midline and thyroid not enlarged. Resp: Clearing to auscultation bilaterally. Cardio: Regular rate and rhythm, S1, S2 normal, II/VI systolic murmur, no click, rub or gallop GI: Soft, non-tender; bowel sounds normal; no organomegaly. Extremities: No edema, cyanosis or clubbing. Skin: Warm and dry.  Neurologic: Alert and oriented X 3, normal strength.  Assessment/Plan Acute coronary syndrome Early congestive heart failure CKD, IIIb Pancytopenia Liver cirrhosis Alcohol use disorder Hypokalemia  Admit. IV heparin per pharmacy. Potassium supplement. Recheck CBC for platelets count. Transfuse 1 unit of PRBC if Hgb is low. Echocardiogram for LV function. Overall prognosis is guarded with low ability to tolerate antiplatelet medications, liver and renal dysfunction  Time spent: Review of old records, Lab, x-rays, EKG, other cardiac tests, examination, discussion with patient, nurse and ER doctor over 70 minutes.  Birdie Riddle, MD  08/06/2019, 11:40 PM

## 2019-08-07 ENCOUNTER — Inpatient Hospital Stay (HOSPITAL_COMMUNITY): Payer: Medicare (Managed Care)

## 2019-08-07 ENCOUNTER — Encounter (HOSPITAL_COMMUNITY): Payer: Self-pay | Admitting: Cardiovascular Disease

## 2019-08-07 DIAGNOSIS — J90 Pleural effusion, not elsewhere classified: Secondary | ICD-10-CM

## 2019-08-07 DIAGNOSIS — J9620 Acute and chronic respiratory failure, unspecified whether with hypoxia or hypercapnia: Secondary | ICD-10-CM | POA: Diagnosis present

## 2019-08-07 LAB — BASIC METABOLIC PANEL
Anion gap: 9 (ref 5–15)
BUN: 27 mg/dL — ABNORMAL HIGH (ref 8–23)
CO2: 20 mmol/L — ABNORMAL LOW (ref 22–32)
Calcium: 9.2 mg/dL (ref 8.9–10.3)
Chloride: 106 mmol/L (ref 98–111)
Creatinine, Ser: 1.82 mg/dL — ABNORMAL HIGH (ref 0.44–1.00)
GFR calc Af Amer: 34 mL/min — ABNORMAL LOW (ref >60)
GFR calc non Af Amer: 29 mL/min — ABNORMAL LOW (ref >60)
Glucose, Bld: 127 mg/dL — ABNORMAL HIGH (ref 70–99)
Potassium: 2.8 mmol/L — ABNORMAL LOW (ref 3.5–5.1)
Sodium: 135 mmol/L (ref 135–145)

## 2019-08-07 LAB — BODY FLUID CELL COUNT WITH DIFFERENTIAL
Eos, Fluid: 4 %
Lymphs, Fluid: 2 %
Monocyte-Macrophage-Serous Fluid: 27 % — ABNORMAL LOW (ref 50–90)
Neutrophil Count, Fluid: 67 % — ABNORMAL HIGH (ref 0–25)
Other Cells, Fluid: 0 %
Total Nucleated Cell Count, Fluid: 5302 cu mm — ABNORMAL HIGH (ref 0–1000)

## 2019-08-07 LAB — PROTIME-INR
INR: 2.1 — ABNORMAL HIGH (ref 0.8–1.2)
INR: 2.3 — ABNORMAL HIGH (ref 0.8–1.2)
INR: 2.7 — ABNORMAL HIGH (ref 0.8–1.2)
Prothrombin Time: 22.7 seconds — ABNORMAL HIGH (ref 11.4–15.2)
Prothrombin Time: 24.6 seconds — ABNORMAL HIGH (ref 11.4–15.2)
Prothrombin Time: 27.8 seconds — ABNORMAL HIGH (ref 11.4–15.2)

## 2019-08-07 LAB — CBC WITH DIFFERENTIAL/PLATELET
Abs Immature Granulocytes: 0.09 10*3/uL — ABNORMAL HIGH (ref 0.00–0.07)
Basophils Absolute: 0 10*3/uL (ref 0.0–0.1)
Basophils Relative: 0 %
Eosinophils Absolute: 0 10*3/uL (ref 0.0–0.5)
Eosinophils Relative: 0 %
HCT: 15 % — ABNORMAL LOW (ref 36.0–46.0)
Hemoglobin: 4.5 g/dL — CL (ref 12.0–15.0)
Immature Granulocytes: 1 %
Lymphocytes Relative: 10 %
Lymphs Abs: 1.3 10*3/uL (ref 0.7–4.0)
MCH: 29.2 pg (ref 26.0–34.0)
MCHC: 30 g/dL (ref 30.0–36.0)
MCV: 97.4 fL (ref 80.0–100.0)
Monocytes Absolute: 1.2 10*3/uL — ABNORMAL HIGH (ref 0.1–1.0)
Monocytes Relative: 9 %
Neutro Abs: 10.2 10*3/uL — ABNORMAL HIGH (ref 1.7–7.7)
Neutrophils Relative %: 80 %
Platelets: 115 10*3/uL — ABNORMAL LOW (ref 150–400)
RBC: 1.54 MIL/uL — ABNORMAL LOW (ref 3.87–5.11)
RDW: 21.2 % — ABNORMAL HIGH (ref 11.5–15.5)
WBC: 12.8 10*3/uL — ABNORMAL HIGH (ref 4.0–10.5)
nRBC: 0 % (ref 0.0–0.2)

## 2019-08-07 LAB — LIPID PANEL
Cholesterol: 170 mg/dL (ref 0–200)
HDL: 51 mg/dL (ref >40)
LDL Cholesterol: 111 mg/dL — ABNORMAL HIGH (ref 0–99)
Total CHOL/HDL Ratio: 3.3 RATIO
Triglycerides: 40 mg/dL (ref <150)
VLDL: 8 mg/dL (ref 0–40)

## 2019-08-07 LAB — PROTEIN, PLEURAL OR PERITONEAL FLUID: Total protein, fluid: 4.1 g/dL

## 2019-08-07 LAB — ECHOCARDIOGRAM COMPLETE
Area-P 1/2: 3.6 cm2
Calc EF: 45.8 %
MV M vel: 5.49 m/s
MV Peak grad: 120.6 mmHg
Single Plane A2C EF: 50.8 %
Single Plane A4C EF: 40.1 %

## 2019-08-07 LAB — CBC
HCT: 20.6 % — ABNORMAL LOW (ref 36.0–46.0)
Hemoglobin: 6.5 g/dL — CL (ref 12.0–15.0)
MCH: 29.7 pg (ref 26.0–34.0)
MCHC: 31.6 g/dL (ref 30.0–36.0)
MCV: 94.1 fL (ref 80.0–100.0)
Platelets: 88 10*3/uL — ABNORMAL LOW (ref 150–400)
RBC: 2.19 MIL/uL — ABNORMAL LOW (ref 3.87–5.11)
RDW: 18.6 % — ABNORMAL HIGH (ref 11.5–15.5)
WBC: 12 10*3/uL — ABNORMAL HIGH (ref 4.0–10.5)
nRBC: 0.2 % (ref 0.0–0.2)

## 2019-08-07 LAB — MRSA PCR SCREENING: MRSA by PCR: NEGATIVE

## 2019-08-07 LAB — APTT: aPTT: 36 seconds (ref 24–36)

## 2019-08-07 LAB — GLUCOSE, PLEURAL OR PERITONEAL FLUID: Glucose, Fluid: 93 mg/dL

## 2019-08-07 LAB — LACTATE DEHYDROGENASE, PLEURAL OR PERITONEAL FLUID: LD, Fluid: 198 U/L — ABNORMAL HIGH (ref 3–23)

## 2019-08-07 LAB — BRAIN NATRIURETIC PEPTIDE: B Natriuretic Peptide: 153.2 pg/mL — ABNORMAL HIGH (ref 0.0–100.0)

## 2019-08-07 LAB — PROTEIN, TOTAL: Total Protein: 5.5 g/dL — ABNORMAL LOW (ref 6.5–8.1)

## 2019-08-07 LAB — LACTIC ACID, PLASMA: Lactic Acid, Venous: 3.6 mmol/L (ref 0.5–1.9)

## 2019-08-07 LAB — PREPARE RBC (CROSSMATCH)

## 2019-08-07 LAB — LACTATE DEHYDROGENASE: LDH: 202 U/L — ABNORMAL HIGH (ref 98–192)

## 2019-08-07 LAB — HIV ANTIBODY (ROUTINE TESTING W REFLEX): HIV Screen 4th Generation wRfx: NONREACTIVE

## 2019-08-07 LAB — PROCALCITONIN: Procalcitonin: <0.1 ng/mL

## 2019-08-07 MED ORDER — SODIUM CHLORIDE 0.9% IV SOLUTION: Freq: Once | INTRAVENOUS | Status: AC

## 2019-08-07 MED ORDER — SODIUM CHLORIDE 0.9 % IV SOLN
1.0000 g | Freq: Two times a day (BID) | INTRAVENOUS | Status: AC
  Administered 2019-08-07 – 2019-08-11 (×10): 1 g via INTRAVENOUS
  Filled 2019-08-07 (×11): qty 1

## 2019-08-07 MED ORDER — POTASSIUM CHLORIDE 10 MEQ/100ML IV SOLN
10.0000 meq | INTRAVENOUS | Status: AC
  Administered 2019-08-07 – 2019-08-08 (×3): 10 meq via INTRAVENOUS

## 2019-08-07 MED ORDER — ORAL CARE MOUTH RINSE
15.0000 mL | Freq: Two times a day (BID) | OROMUCOSAL | Status: DC
  Administered 2019-08-07 – 2019-08-13 (×11): 15 mL via OROMUCOSAL

## 2019-08-07 MED ORDER — MORPHINE SULFATE (PF) 2 MG/ML IV SOLN
2.0000 mg | INTRAVENOUS | Status: DC | PRN
  Administered 2019-08-07 – 2019-08-13 (×9): 2 mg via INTRAVENOUS
  Filled 2019-08-07 (×10): qty 1

## 2019-08-07 MED ORDER — CHLORHEXIDINE GLUCONATE CLOTH 2 % EX PADS
6.0000 | MEDICATED_PAD | Freq: Every day | CUTANEOUS | Status: DC
  Administered 2019-08-07 – 2019-08-14 (×8): 6 via TOPICAL

## 2019-08-07 MED ORDER — VANCOMYCIN HCL 1250 MG/250ML IV SOLN
1250.0000 mg | Freq: Once | INTRAVENOUS | Status: AC
  Administered 2019-08-07: 1250 mg via INTRAVENOUS
  Filled 2019-08-07 (×2): qty 250

## 2019-08-07 MED ORDER — LACTATED RINGERS IV BOLUS
1000.0000 mL | Freq: Once | INTRAVENOUS | Status: AC
  Administered 2019-08-07: 1000 mL via INTRAVENOUS

## 2019-08-07 MED ORDER — VITAMIN K1 10 MG/ML IJ SOLN
5.0000 mg | Freq: Once | INTRAVENOUS | Status: AC
  Administered 2019-08-07: 5 mg via INTRAVENOUS
  Filled 2019-08-07: qty 0.5

## 2019-08-07 MED ORDER — LACTATED RINGERS IV SOLN: INTRAVENOUS | Status: DC

## 2019-08-07 MED ORDER — OCTREOTIDE LOAD VIA INFUSION
100.0000 ug | Freq: Once | INTRAVENOUS | Status: DC
  Filled 2019-08-07: qty 50

## 2019-08-07 MED ORDER — PHYTONADIONE 5 MG PO TABS
5.0000 mg | ORAL_TABLET | Freq: Every day | ORAL | Status: DC
  Administered 2019-08-08: 5 mg via ORAL
  Filled 2019-08-07: qty 1

## 2019-08-07 MED ORDER — MORPHINE SULFATE (PF) 2 MG/ML IV SOLN
1.0000 mg | INTRAVENOUS | Status: DC | PRN
  Administered 2019-08-07 (×2): 1 mg via INTRAVENOUS
  Filled 2019-08-07 (×3): qty 1

## 2019-08-07 MED ORDER — SODIUM CHLORIDE 0.9% FLUSH
10.0000 mL | Freq: Three times a day (TID) | INTRAVENOUS | Status: DC
  Administered 2019-08-07 – 2019-08-12 (×14): 10 mL

## 2019-08-07 MED ORDER — PANTOPRAZOLE SODIUM 40 MG IV SOLR
40.0000 mg | Freq: Two times a day (BID) | INTRAVENOUS | Status: DC
  Administered 2019-08-07 (×2): 40 mg via INTRAVENOUS
  Filled 2019-08-07 (×2): qty 40

## 2019-08-07 MED ORDER — SODIUM CHLORIDE 0.9 % IV SOLN
50.0000 ug/h | INTRAVENOUS | Status: DC
  Filled 2019-08-07: qty 1

## 2019-08-07 MED ORDER — POTASSIUM CHLORIDE 10 MEQ/100ML IV SOLN: 10.0000 meq | INTRAVENOUS | Status: DC

## 2019-08-07 MED ORDER — VANCOMYCIN HCL IN DEXTROSE 1-5 GM/200ML-% IV SOLN
1000.0000 mg | INTRAVENOUS | Status: DC
  Administered 2019-08-08: 1000 mg via INTRAVENOUS
  Filled 2019-08-07 (×2): qty 200

## 2019-08-07 NOTE — ED Provider Notes (Signed)
Dr. Doylene Canard called me after her CTA of the chest had resulted and her CBC had resulted and were called to him by nursing staff.  Patient has a significant anemia with a hemoglobin of 4.5.  She now has a left pleural effusion that fills the whole left chest with compression of the left lung.  Dr. Doylene Canard wants me to put in orders to stop her heparin, order for 2 units of blood to be transfused and he wants a consult with pulmonary critical care about her collapsed lung.  He states that he will be glad to continue as the primary care doctor for the patient or if pulmonary critical care wants to take over that is also okay with him.  3:00 AM I went in to see the patient and explained these most recent test results.  She is in agreement to getting the blood transfusion.  3:04 AM Dr. Oletta Darter, pulmonary critical care.  We discussed her CT and lab results.  In addition to her blood transfusions he wants her to get 3 units of fresh frozen plasma due to her elevated INR of 2.7 so that she would be able to have thoracentesis in the morning.  She currently is having oxygen levels of 91 through 97% on room air.  He states if the patient crashes or gets worse to call back.  He states otherwise he will put patient on the consult list for in the morning.  3:10 AM Dr. Doylene Canard is here in the ED.  He is aware of Dr. Levada Schilling recommendations.   Rolland Porter, MD 08/07/19 [phone]

## 2019-08-07 NOTE — Consult Note (Signed)
NAME:  Dana Watkins, MRN:  [id number], DOB:  10/18/1955, LOS: 1 ADMISSION DATE:  08/06/2019, CONSULTATION DATE: 08/07/19 REFERRING MD:  Jeanne Ivan MD, CHIEF COMPLAINT:  Dyspnea, pleural effusion  Brief History   63 with HTN, CAD, CKD, migraines, mitral regurg, pancytopenia, cirrhosis Recently evaluated in the ED on 7/28 for bronchitis with left effusion. Treated with azithromycin with no improvement Came back to the ED with worsening dyspnea, chest discomfort. Chest x-ray with significantly increased large left effusion. Initially started on IV heparin for suspected ACS but subsequently stopped after she was found to have a hemoglobin of 4.5 with elevated INR PCCM consulted  Past Medical History    has a past medical history of Acute hypokalemia (11/26/2014), Arthritis (05/15/2015), Benign essential hypertension (05/15/2015), C. difficile colitis, CAD (coronary artery disease), CKD (chronic kidney disease), stage III, Hypokalemia, Migraine headache (05/15/2015), Mild mitral regurgitation, Pancytopenia ([place]), Sepsis ([place]) (01/2017), Thrombocytopenia ([place]), and Vitamin D deficiency.  Significant Hospital Events   7/27 Admit  Consults:  PCCM  Procedures:    Significant Diagnostic Tests:  Chest x-ray 07/31/2019-mild left base atelectasis with pleural effusion CT chest 02/06/2019- No PE, large effusion with left hemithorax opacification and collapse of left lung.  Micro Data:    Antimicrobials:    Interim history/subjective:    Objective   Blood pressure (!) 140/73, pulse 97, temperature 97.9 F (36.6 C), temperature source Oral, resp. rate 17, SpO2 98 %.        Intake/Output Summary (Last 24 hours) at 08/07/2019 0847 Last data filed at 08/07/2019 0510 Gross per 24 hour  Intake 501 ml  Output --  Net 501 ml   There were no vitals filed for this visit.  Examination: Gen:      Mild distress HEENT:  EOMI, sclera anicteric Neck:     No masses; no thyromegaly Lungs:     Diminished breath sounds on the left CV:         Regular rate and rhythm; no murmurs Abd:      + bowel sounds; soft, non-tender; no palpable masses, no distension Ext:    No edema; adequate peripheral perfusion Skin:      Warm and dry; no rash Neuro: alert and oriented x 3  Resolved Hospital Problem list     Assessment & Plan:  Left pleural effusion Significantly increased since 7/20 when she was assessed in the ED Differential diagnosis includes pneumonia related effusion/empyema, hepatic hydrothorax or hemothorax given low hemoglobin. Plan on thoracentesis when she has been resuscitated with PRBCs and INR corrected with FFP Empiric antibiotics Check Pct  Low hemoglobin Patient denies any overt bleeding. EGD on 06/13/2019 with portal gastropathy and no varices Follows with Dr. Paulita Fujita. Will consult GI  Cirrhosis with portal hypertension EtOH abuse Appears decompensated with worsening bilirubin and INR Supportive care  Pancytopenia secondary to EtOH, cirrhosis Gets Aranesp as an outpatient from Dr. Lindi Adie Monitor CBC  Chronic kidney disease Monitor urine output and creatinine  Elevated troponin Likely demand ischemia Holding heparin. Follow echocardiogram  Best practice:  Diet: NPO Pain/Anxiety/Delirium protocol (if indicated): NA VAP protocol (if indicated): NA DVT prophylaxis: SCDs GI prophylaxis: PPI Glucose control: [place] Mobility: Bed Code Status: Full Family Communication: patient updated Disposition: SDU  Labs   CBC: Recent Labs  Lab 07/31/19 1414 08/07/19 0224  WBC 4.9 12.8*  NEUTROABS 3.3 10.2*  HGB 7.9* 4.5*  HCT 25.1* 15.0*  MCV 91.3 97.4  PLT 56* 115*    Basic Metabolic Panel: Recent Labs  Lab 07/31/19 1414 08/06/19 2055  NA 136 136  K 2.8* 3.1*  CL 109 107  CO2 19* 17*  GLUCOSE 106* 149*  BUN 22 23  CREATININE 1.34* 1.69*  CALCIUM 9.2 9.3   GFR: Estimated Creatinine Clearance: 34.4 mL/min (A) (by C-G formula based on SCr of  1.69 mg/dL (H)). Recent Labs  Lab 07/31/19 1414 08/07/19 0224  WBC 4.9 12.8*    Liver Function Tests: Recent Labs  Lab 07/31/19 1414 08/06/19 2055  AST 61* 56*  ALT 33 34  ALKPHOS 178* 180*  BILITOT 6.7* 6.1*  PROT 5.9* 5.1*  ALBUMIN 2.7* 2.4*   No results for input(s): LIPASE, AMYLASE in the last 168 hours. No results for input(s): AMMONIA in the last 168 hours.  ABG    Component Value Date/Time   HCO3 16.9 (L) 10/30/2018 1810   ACIDBASEDEF 7.0 (H) 10/30/2018 1810   O2SAT 59.8 10/30/2018 1810     Coagulation Profile: Recent Labs  Lab 08/07/19 0030  INR 2.7*    Cardiac Enzymes: No results for input(s): CKTOTAL, CKMB, CKMBINDEX, TROPONINI in the last 168 hours.  HbA1C: No results found for: HGBA1C  CBG: No results for input(s): GLUCAP in the last 168 hours.  Review of Systems:   REVIEW OF SYSTEMS:   All negative; except for those that are bolded, which indicate positives.  Constitutional: weight loss, weight gain, night sweats, fevers, chills, fatigue, weakness.  HEENT: headaches, sore throat, sneezing, nasal congestion, post nasal drip, difficulty swallowing, tooth/dental problems, visual complaints, visual changes, ear aches. Neuro: difficulty with speech, weakness, numbness, ataxia. CV:  chest pain, orthopnea, PND, swelling in lower extremities, dizziness, palpitations, syncope.  Resp: cough, hemoptysis, dyspnea, wheezing. GI: heartburn, indigestion, abdominal pain, nausea, vomiting, diarrhea, constipation, change in bowel habits, loss of appetite, hematemesis, melena, hematochezia.  GU: dysuria, change in color of urine, urgency or frequency, flank pain, hematuria. MSK: joint pain or swelling, decreased range of motion. Psych: change in mood or affect, depression, anxiety, suicidal ideations, homicidal ideations. Skin: rash, itching, bruising.  Past Medical History  She,  has a past medical history of Acute hypokalemia (11/26/2014), Arthritis  (05/15/2015), Benign essential hypertension (05/15/2015), C. difficile colitis, CAD (coronary artery disease), CKD (chronic kidney disease), stage III, Hypokalemia, Migraine headache (05/15/2015), Mild mitral regurgitation, Pancytopenia ([place]), Sepsis ([place]) (01/2017), Thrombocytopenia ([place]), and Vitamin D deficiency.   Surgical History    Past Surgical History:  Procedure Laterality Date  . CESAREAN SECTION     24 years ago  . ESOPHAGOGASTRODUODENOSCOPY (EGD) WITH PROPOFOL N/A 06/13/2019   Procedure: ESOPHAGOGASTRODUODENOSCOPY (EGD) WITH PROPOFOL;  Surgeon: Arta Silence, MD;  [facility];  Service: Endoscopy;  Laterality: N/A;  administration of one bag of platelets   . LEFT HEART CATH AND CORONARY ANGIOGRAPHY N/A 05/12/2017   Procedure: LEFT HEART CATH AND CORONARY ANGIOGRAPHY;  Surgeon: [name] Booze, MD;  Location: [facility];  Service: Cardiovascular;  Laterality: N/A;  . tonsils and adneoids     as a child  . ULTRASOUND GUIDANCE FOR VASCULAR ACCESS  05/12/2017   Procedure: Ultrasound Guidance For Vascular Access;  Surgeon: [name] Booze, MD;  Location: [facility];  Service: Cardiovascular;;     Social History   reports that she quit smoking about 5 years ago. She started smoking about 51 years ago. She quit after 2.00 years of use. She has never used smokeless tobacco. She reports current alcohol use. She reports that she does not use drugs.   Family  History   Her family history includes Atrial fibrillation in her mother; Heart attack in her maternal grandfather; Heart disease in her paternal grandfather; Pernicious anemia in her maternal grandfather; Supraventricular tachycardia in her father.   Allergies Allergies  Allergen Reactions  . Sulfa Antibiotics Nausea And Vomiting and Rash     Home Medications  Prior to Admission medications   Medication Sig Start Date End Date Taking? Authorizing Provider  Ascorbic Acid (VITAMIN C) 1000 MG tablet Take  1,000 mg by mouth in the morning and at bedtime. Chewable   Yes [provider]  butalbital-acetaminophen-caffeine (FIORICET) 50-325-40 MG tablet Take 1 tablet by mouth every 6 (six) hours as needed for headache or migraine.  03/09/19  Yes [provider]  ciprofloxacin (CIPRO) 500 MG tablet Take 500 mg by mouth daily.  08/02/19  Yes [provider]  folic acid (FOLVITE) 1 MG tablet Take 1 tablet (1 mg total) by mouth daily. 06/29/18  Yes Sheikh, Omair Latif, DO  MAGNESIUM PO Take 400 mg by mouth daily.    Yes [provider]  Multiple Vitamin (MULTIVITAMIN WITH MINERALS) TABS tablet Take 1 tablet by mouth daily. Patient taking differently: Take 1 tablet by mouth daily. Woman's/ Gummie 06/29/18  Yes Sheikh, Omair Latif, DO  ondansetron (ZOFRAN-ODT) 4 MG disintegrating tablet DISSOLVE 1 TABLET UNDER TONGUE AS NEEDED EVERY 8 HOURS Patient taking differently: Take 4 mg by mouth every 8 (eight) hours as needed for nausea or vomiting.  07/23/19  Yes Nicholas Lose, MD  potassium chloride (KLOR-CON) 10 MEQ tablet Take 10 mEq by mouth daily.   Yes [provider]  spironolactone (ALDACTONE) 25 MG tablet Take 1 tablet (25 mg total) by mouth daily. Patient taking differently: Take 25 mg by mouth daily as needed (swelling).  07/12/19  Yes Nicholas Lose, MD  potassium chloride (KLOR-CON) 10 MEQ tablet Take 1 tablet (10 mEq total) by mouth daily for 5 days. 07/31/19 08/05/19  Couture, Cortni S, PA-C     Critical care time: NA   Marshell Garfinkel MD [facility] Please see [email or web address] for pager details.  08/07/2019, 8:48 AM

## 2019-08-07 NOTE — Progress Notes (Signed)
  Echocardiogram 2D Echocardiogram has been performed.  Michiel Cowboy 08/07/2019, 8:48 AM

## 2019-08-07 NOTE — Procedures (Signed)
Thoracentesis  Procedure Note  Dana Watkins  [id number]  [birth year]  Date:08/07/19  Time:1:55 PM   Provider Performing:Jereme Loren   Procedure: Thoracentesis with imaging guidance (56387)  Indication(s) Pleural Effusion  Consent Risks of the procedure as well as the alternatives and risks of each were explained to the patient and/or caregiver.  Consent for the procedure was obtained and is signed in the bedside chart  Anesthesia Topical only with 1% lidocaine    Time Out Verified patient identification, verified procedure, site/side was marked, verified correct patient position, special equipment/implants available, medications/allergies/relevant history reviewed, required imaging and test results available.   Sterile Technique Maximal sterile technique including full sterile barrier drape, hand hygiene, sterile gown, sterile gloves, mask, hair covering, sterile ultrasound probe cover (if used).  Procedure Description Ultrasound was used to identify appropriate pleural anatomy for placement and overlying skin marked.  Area of drainage cleaned and draped in sterile fashion. Lidocaine was used to anesthetize the skin and subcutaneous tissue.  1200 cc's of bloody fluid was drained from the left pleural space. Procedure was stopped when patient started coughing. Catheter then removed and bandaid applied to site.   Complications/Tolerance None; patient tolerated the procedure well. Chest X-ray is ordered to confirm no post-procedural complication.   EBL Minimal   Specimen(s) Pleural fluid   Dana Galentine MD [facility] Please see [email or web address] for pager details.  08/07/2019, 1:56 PM

## 2019-08-07 NOTE — ED Notes (Signed)
Pt is on a Pure Wick.

## 2019-08-07 NOTE — ED Notes (Signed)
Report received from [facility]. Pt without a chest tube at this time receiving blood. Per nurse, they never received notified from Radiology about the pneumothorax and then when they found out, decided to wait until the morning to act on it.

## 2019-08-07 NOTE — Progress Notes (Signed)
Ref: Lawerance Cruel, MD   Subjective:  Awake. Short of breath but able to converse and lying on left lateral position. Oxygen saturation is 100 % on 3 L oxygen by [place].  IV heparin is turned off.  She has orders to receive 2 units of PRBC and 3 units of FFP for anemia and elevated PT/INR.  Objective:  Vital Signs in the last 24 hours: Temp:  [98 F (36.7 C)] 98 F (36.7 C) (07/26 1857) Pulse Rate:  [95-116] 107 (07/27 0230) Resp:  [18-28] 28 (07/27 0230) BP: (112-151)/(58-81) 142/78 (07/27 0230) SpO2:  [91 %-98 %] 92 % (07/27 0230)  Physical Exam: BP Readings from Last 1 Encounters:  08/07/19 (!) 142/78     Wt Readings from Last 1 Encounters:  08/02/19 65.3 kg    Weight change:  There is no height or weight on file to calculate BMI. HEENT: [place]/AT, Eyes-Blue, Conjunctiva-Pale, Sclera-mildly-icteric Neck: No JVD, No bruit, Trachea midline. Lungs:  Clearing, decreased sounds on left side. Cardiac:  Regular rhythm, normal S1 and S2, no S3. II/VI systolic murmur. Abdomen:  Soft, non-tender. BS present. Extremities:  No edema present. No cyanosis. No clubbing. CNS: AxOx3, Cranial nerves grossly intact, moves all 4 extremities.  Skin: Warm and dry.   Intake/Output from previous day: No intake/output data recorded.    Lab Results: BMET    Component Value Date/Time   NA 136 08/06/2019 2055   NA 136 07/31/2019 1414   NA 140 07/12/2019 1122   NA 139 02/15/2019 1012   NA 145 (H) 05/25/2017 1419   NA 142 05/09/2017 1533   NA 141 11/25/2014 1532   NA 141 05/13/2014 0847   K 3.1 (L) 08/06/2019 2055   K 2.8 (L) 07/31/2019 1414   K 2.7 (LL) 07/12/2019 1122   K 2.7 (LL) 11/25/2014 1532   K 3.1 (L) 05/13/2014 0847   CL 107 08/06/2019 2055   CL 109 07/31/2019 1414   CL 113 (H) 07/12/2019 1122   CO2 17 (L) 08/06/2019 2055   CO2 19 (L) 07/31/2019 1414   CO2 16 (L) 07/12/2019 1122   CO2 22 11/25/2014 1532   CO2 20 (L) 05/13/2014 0847   GLUCOSE 149 (H) 08/06/2019 2055    GLUCOSE 106 (H) 07/31/2019 1414   GLUCOSE 83 07/12/2019 1122   GLUCOSE 113 11/25/2014 1532   GLUCOSE 105 05/13/2014 0847   BUN 23 08/06/2019 2055   BUN 22 07/31/2019 1414   BUN 8 07/12/2019 1122   BUN 11 02/15/2019 1012   BUN 15 05/25/2017 1419   BUN 14 05/09/2017 1533   BUN 16.4 11/25/2014 1532   BUN 16.2 05/13/2014 0847   CREATININE 1.69 (H) 08/06/2019 2055   CREATININE 1.34 (H) 07/31/2019 1414   CREATININE 1.36 (H) 07/12/2019 1122   CREATININE 1.29 (H) 07/05/2019 1507   CREATININE 1.30 (H) 04/16/2019 1427   CREATININE 1.4 (H) 11/25/2014 1532   CREATININE 1.2 (H) 05/13/2014 0847   CALCIUM 9.3 08/06/2019 2055   CALCIUM 9.2 07/31/2019 1414   CALCIUM 9.1 07/12/2019 1122   CALCIUM 10.4 11/25/2014 1532   CALCIUM 9.3 05/13/2014 0847   GFRNONAA 32 (L) 08/06/2019 2055   GFRNONAA 42 (L) 07/31/2019 1414   GFRNONAA 41 (L) 07/12/2019 1122   GFRNONAA 44 (L) 07/05/2019 1507   GFRNONAA 44 (L) 04/16/2019 1427   GFRAA 37 (L) 08/06/2019 2055   GFRAA 49 (L) 07/31/2019 1414   GFRAA 48 (L) 07/12/2019 1122   GFRAA 51 (L) 07/05/2019 1507  GFRAA 51 (L) 04/16/2019 1427   CBC    Component Value Date/Time   WBC 12.8 (H) 08/07/2019 0224   RBC 1.54 (L) 08/07/2019 0224   HGB 4.5 (LL) 08/07/2019 0224   HGB 9.3 (L) 07/12/2019 1122   HGB 7.9 (L) 02/15/2019 1012   HGB 12.6 11/25/2014 1532   HCT 15.0 (L) 08/07/2019 0224   HCT 23.2 (L) 02/15/2019 1012   HCT 38.5 11/25/2014 1532   PLT 115 (L) 08/07/2019 0224   PLT 55 (L) 07/12/2019 1122   PLT 82 (LL) 02/15/2019 1012   MCV 97.4 08/07/2019 0224   MCV 84 02/15/2019 1012   MCV 87.9 11/25/2014 1532   MCH 29.2 08/07/2019 0224   MCHC 30.0 08/07/2019 0224   RDW 21.2 (H) 08/07/2019 0224   RDW 17.0 (H) 02/15/2019 1012   RDW 15.9 (H) 11/25/2014 1532   LYMPHSABS 1.3 08/07/2019 0224   LYMPHSABS 1.2 02/15/2019 1012   LYMPHSABS 1.2 11/25/2014 1532   MONOABS 1.2 (H) 08/07/2019 0224   MONOABS 0.4 11/25/2014 1532   EOSABS 0.0 08/07/2019 0224   EOSABS 0.2  02/15/2019 1012   BASOSABS 0.0 08/07/2019 0224   BASOSABS 0.1 02/15/2019 1012   BASOSABS 0.0 11/25/2014 1532   HEPATIC Function Panel Recent Labs    07/12/19 1122 07/31/19 1414 08/06/19 2055  PROT 6.7 5.9* 5.1*   HEMOGLOBIN A1C No components found for: HGA1C,  MPG CARDIAC ENZYMES Lab Results  Component Value Date   CKTOTAL 51 12/08/2018   TROPONINI 0.03 (HH) 06/25/2018   BNP No results for input(s): PROBNP in the last 8760 hours. TSH Recent Labs    12/09/18 1518 02/15/19 1012  TSH 0.752 1.140   CHOLESTEROL No results for input(s): CHOL in the last 8760 hours.  Scheduled Meds: . sodium chloride   Intravenous Once  . aspirin EC  81 mg Oral Daily  . folic acid  1 mg Oral Daily  . magnesium oxide  400 mg Oral Daily  . multivitamin with minerals  1 tablet Oral Daily  . potassium chloride  20 mEq Oral BID   Continuous Infusions: . sodium chloride     PRN Meds:.acetaminophen, morphine injection, nitroGLYCERIN, ondansetron (ZOFRAN) IV  Assessment/Plan: Acute coronary syndrome Large left pleural effusion with left lung collapse Early left heart systolic failure Early acute respiratory failure CKD, IIIb Liver cirrhosis Anemia, symptomatic Alcohol use disorder Hypokalemia Pancytopenia  Heparin discontinued. Awaiting blood transfusion and fresh frozen plasma transfusion. CCM consulted by ER physician.  Awaiting INR to improve for thoracentesis for large left pleural effusion.. Patient is not a candidate for cardiac interventions for now. Prognosis remains guarded.   LOS: 1 day   Time spent including chart review, lab review, examination, discussion with patient, ER physician and nurse : 30 min   Dixie Dials  MD  08/07/2019, 3:16 AM

## 2019-08-07 NOTE — Procedures (Signed)
Insertion of Chest Tube Procedure Note  Dana Watkins  [id number]  09-Sep-1955  Date:08/07/19  Time:5:22 PM    Provider Performing: Marshell Garfinkel   Procedure: Chest Tube Insertion (43837)  Indication(s) Hemothorax  Consent Risks of the procedure as well as the alternatives and risks of each were explained to the patient and/or caregiver.  Consent for the procedure was obtained and is signed in the bedside chart  Anesthesia Topical only with 1% lidocaine    Time Out Verified patient identification, verified procedure, site/side was marked, verified correct patient position, special equipment/implants available, medications/allergies/relevant history reviewed, required imaging and test results available.   Sterile Technique Maximal sterile technique including full sterile barrier drape, hand hygiene, sterile gown, sterile gloves, mask, hair covering, sterile ultrasound probe cover (if used).   Procedure Description Ultrasound used to identify appropriate pleural anatomy for placement and overlying skin marked. Area of placement cleaned and draped in sterile fashion.  A  pigtail pleural catheter was placed into the left pleural space using Seldinger technique. Appropriate return of blood was obtained.  The tube was connected to atrium and placed on -20 cm H2O wall suction.   Complications/Tolerance None; patient tolerated the procedure well. Chest X-ray is ordered to verify placement.   EBL Minimal  Specimen(s) none  Dana Ginsberg MD [facility] Please see [email or web address] for pager details.  08/07/2019, 5:23 PM

## 2019-08-07 NOTE — Progress Notes (Signed)
Pharmacy Antibiotic Note  Dana Watkins is a 64 y.o. female admitted on 08/06/2019 with pneumonia.  Pharmacy has been consulted for vancomycin and meropenem dosing.  Originally, consulted to dose cefepime. However due to recent ESBL UTI in 10/2018, order was changed from cefepime to meropenem.  Plan: Vancomycin 1250 mg IV x1, then 1000 mg IV q24h  Meropenem 1 gr IV q12h  Monitor clinical course, renal function, cultures as available      Temp (24hrs), Avg:98 F (36.7 C), Min:97.8 F (36.6 C), Max:98.2 F (36.8 C)  Recent Labs  Lab 07/31/19 1414 08/06/19 2055 08/07/19 0224  WBC 4.9  --  12.8*  CREATININE 1.34* 1.69*  --     Estimated Creatinine Clearance: 34.4 mL/min (A) (by C-G formula based on SCr of 1.69 mg/dL (H)).    Allergies  Allergen Reactions  . Sulfa Antibiotics Nausea And Vomiting and Rash    Antimicrobials this admission: 7/27 vancomycin >>  7/27 meropenem >>   Dose adjustments this admission:   Microbiology results: 7/26 COVID-19 negative 7/27 HIV antibody: negative   Thank you for allowing pharmacy to be a part of this patient's care.   Royetta Asal, PharmD, BCPS 08/07/2019 10:55 AM

## 2019-08-07 NOTE — Progress Notes (Addendum)
PCCM progress note  Discussed with cardiothoracic surgery service. Requested transfer to [facility] for evaluation Chest tube placed for complete drainage of hemothorax  Labs reviewed with several abnormalities K2.8, creatinine higher at 1.82, creatinine 3.6 Hemoglobin after 2 units 6.5, INR 2.3  Plan: Replete potassium Transfuse 2 units FFP, 2 units platelets Vitamin K 10 units LR bolus and infusion at 100cc/hr Get TEG on arrival at [facility] to determine best way to reverse coagulopathy Cardiothoracic surgery to evaluate patient at [facility]  The patient is critically ill with multiple organ system failure and requires high complexity decision making for assessment and support, frequent evaluation and titration of therapies, advanced monitoring, review of radiographic studies and interpretation of complex data.   Critical Care Time devoted to patient care services, exclusive of separately billable procedures, described in this note is 35 minutes.   Marshell Garfinkel MD [facility] Please see [email or web address] for pager details.  08/07/2019, 5:21 PM

## 2019-08-07 NOTE — Progress Notes (Signed)
CRITICAL VALUE ALERT  Critical Value:  hgb 6.5   Date & Time Notied:  08/07/19 1700  Provider Notified: Dr. Vaughan Browner   Orders Received/Actions taken: 2 more units PRBCs

## 2019-08-07 NOTE — Progress Notes (Signed)
Pt complained of sharp pain on left side. Chest tube noticed to have dumped an additional 1.2L in 20 minutes. MD called, verbal order given to clamp chest tube until MD arrival. Stat chest x-ray obtained. After Dr. Vaughan Browner review of x-ray, verbal order to continue chest tube on water seal

## 2019-08-07 NOTE — Progress Notes (Signed)
eLink Physician-Brief Progress Note Patient Name: Dana Watkins DOB: December 08, 1955 MRN: [id number]   Date of Service  08/07/2019  HPI/Events of Note  Notified that pain at chest tube site on deep inspiration despite morphine 1 mg  eICU Interventions  Increased morphine to 29m     Intervention Category Intermediate Interventions: Pain - evaluation and management  EJudd Lien7/27/2021, 9:15 PM

## 2019-08-07 NOTE — Consult Note (Signed)
Referring Provider: ED Primary Care Physician:  Lawerance Cruel, MD Primary Gastroenterologist:  Dr. Paulita Fujita  Reason for Consultation:  Anemia, cirrhosis  HPI: Dana Watkins is a 64 y.o. female with past medical history noted below to include cirrhosis of the liver and portal hypertensive gastropathy, CAD, CKD stage III, and pancytopenia presenting for consultation of acute anemia.  Patient presented to the ED yesterday with chest pain and shortness of breath and was found to have acute coronary syndrome and early congestive heart failure, as well as left pleural effusion with complete collapse of the left lung.  Patient was started on heparin and subsequently developed a drop in hemoglobin.  Hemoglobin 4.5 this morning (per epic, yesterday's hemoglobin is still in process).  Patient states she had bronchitis last week and was started on antibiotics, but presented to the ED due to worsening shortness of breath and chest pain.  Patient reports nausea and vomiting today but denies any recent hematemesis or coffee-ground emesis.  Further denies any melena or hematochezia.  She endorses shortness of breath and chest pain but denies any abdominal pain.    She has noticed gradual distention of her abdomen, as well as weight gain and states she takes spironolactone as needed.  She took 1 dose this week.  Denies any aspirin or blood thinner use at home.  She states she did take a few doses of Aleve this week for chest pain related to bronchitis.  She drinks alcohol socially at least once per week but denies daily use.  EGD 06/13/19: LA Grade A esophagitis.  Portal hypertensive gastropathy.  No evidence of esophageal varices.  Past Medical History:  Diagnosis Date  . Acute hypokalemia 11/26/2014  . Arthritis 05/15/2015  . Benign essential hypertension 05/15/2015  . C. difficile colitis   . CAD (coronary artery disease)    a. cath 04/2017: ""Diffuse, calcific CAD particularly in the distal RCA and  proximal to mid LAD.  LAD disease is nonobstructive.  RCA disease is more severe but does not appear significant.  Given her lack of symptoms, would pursue medical therapy."  . CKD (chronic kidney disease), stage III   . Hypokalemia   . Migraine headache 05/15/2015  . Mild mitral regurgitation   . Pancytopenia ([place])   . Sepsis ([place]) 01/2017  . Thrombocytopenia ([place])    a. chronic thrombocytopenia ([facility] - remotely saw hematology).  . Vitamin D deficiency     Past Surgical History:  Procedure Laterality Date  . CESAREAN SECTION     24 years ago  . ESOPHAGOGASTRODUODENOSCOPY (EGD) WITH PROPOFOL N/A 06/13/2019   Procedure: ESOPHAGOGASTRODUODENOSCOPY (EGD) WITH PROPOFOL;  Surgeon: Arta Silence, MD;  Location: [facility];  Service: Endoscopy;  Laterality: N/A;  administration of one bag of platelets   . LEFT HEART CATH AND CORONARY ANGIOGRAPHY N/A 05/12/2017   Procedure: LEFT HEART CATH AND CORONARY ANGIOGRAPHY;  Surgeon: [name] Booze, MD;  Location: [facility];  Service: Cardiovascular;  Laterality: N/A;  . tonsils and adneoids     as a child  . ULTRASOUND GUIDANCE FOR VASCULAR ACCESS  05/12/2017   Procedure: Ultrasound Guidance For Vascular Access;  Surgeon: [name] Booze, MD;  Location: [facility];  Service: Cardiovascular;;    Prior to Admission medications   Medication Sig Start Date End Date Taking? Authorizing Provider  Ascorbic Acid (VITAMIN C) 1000 MG tablet Take 1,000 mg by mouth in the morning and at bedtime. Chewable   Yes [provider]  butalbital-acetaminophen-caffeine (FIORICET) 50-325-40 MG tablet Take 1 tablet by mouth every 6 (six) hours as needed for headache or migraine.  03/09/19  Yes [provider]  ciprofloxacin (CIPRO) 500 MG tablet Take 500 mg by mouth daily.  08/02/19  Yes [provider]  folic acid (FOLVITE) 1 MG tablet Take 1 tablet (1 mg total) by mouth daily. 06/29/18  Yes Sheikh, Omair Latif, DO  MAGNESIUM  PO Take 400 mg by mouth daily.    Yes [provider]  Multiple Vitamin (MULTIVITAMIN WITH MINERALS) TABS tablet Take 1 tablet by mouth daily. Patient taking differently: Take 1 tablet by mouth daily. Woman's/ Gummie 06/29/18  Yes Sheikh, Omair Latif, DO  ondansetron (ZOFRAN-ODT) 4 MG disintegrating tablet DISSOLVE 1 TABLET UNDER TONGUE AS NEEDED EVERY 8 HOURS Patient taking differently: Take 4 mg by mouth every 8 (eight) hours as needed for nausea or vomiting.  07/23/19  Yes Nicholas Lose, MD  potassium chloride (KLOR-CON) 10 MEQ tablet Take 10 mEq by mouth daily.   Yes [provider]  spironolactone (ALDACTONE) 25 MG tablet Take 1 tablet (25 mg total) by mouth daily. Patient taking differently: Take 25 mg by mouth daily as needed (swelling).  07/12/19  Yes Nicholas Lose, MD  potassium chloride (KLOR-CON) 10 MEQ tablet Take 1 tablet (10 mEq total) by mouth daily for 5 days. 07/31/19 08/05/19  Couture, Cortni S, PA-C    Scheduled Meds: . sodium chloride   Intravenous Once  . aspirin EC  81 mg Oral Daily  . folic acid  1 mg Oral Daily  . magnesium oxide  400 mg Oral Daily  . multivitamin with minerals  1 tablet Oral Daily  . potassium chloride  20 mEq Oral BID   Continuous Infusions: . sodium chloride 20 mL/hr at 08/07/19 0730  . meropenem (MERREM) IV    . [START ON 08/08/2019] vancomycin    . vancomycin     PRN Meds:.acetaminophen, morphine injection, nitroGLYCERIN, ondansetron (ZOFRAN) IV  Allergies as of 08/06/2019 - Review Complete 08/06/2019  Allergen Reaction Noted  . Sulfa antibiotics Nausea And Vomiting and Rash 04/30/2013    Family History  Problem Relation Age of Onset  . Pernicious anemia Maternal Grandfather   . Heart attack Maternal Grandfather   . Atrial fibrillation Mother   . Supraventricular tachycardia Father   . Heart disease Paternal Grandfather     Social History   Socioeconomic History  . Marital status: Divorced    Spouse name: Not on  file  . Number of children: Not on file  . Years of education: Not on file  . Highest education level: Not on file  Occupational History  . Not on file  Tobacco Use  . Smoking status: Former Smoker    Years: 2.00    Start date: 06/25/1968    Quit date: 05/16/2014    Years since quitting: 5.2  . Smokeless tobacco: Never Used  Vaping Use  . Vaping Use: Never used  Substance and Sexual Activity  . Alcohol use: Yes    Comment: occ  . Drug use: No  . Sexual activity: Not on file  Other Topics Concern  . Not on file  Social History Narrative   Lives with husband  In home.  Has seasonal allergies.  Drinks coffee, 1 cup AM and 3 plus sweet tea.  Has 3 grown kids.     Social Determinants of Health   Financial Resource Strain:   . Difficulty of Paying Living Expenses:  Food Insecurity:   . Worried About Charity fundraiser in the Last Year:   . Arboriculturist in the Last Year:   Transportation Needs:   . Film/video editor (Medical):   Marland Kitchen Lack of Transportation (Non-Medical):   Physical Activity:   . Days of Exercise per Week:   . Minutes of Exercise per Session:   Stress:   . Feeling of Stress :   Social Connections:   . Frequency of Communication with Friends and Family:   . Frequency of Social Gatherings with Friends and Family:   . Attends Religious Services:   . Active Member of Clubs or Organizations:   . Attends Archivist Meetings:   Marland Kitchen Marital Status:   Intimate Partner Violence:   . Fear of Current or Ex-Partner:   . Emotionally Abused:   Marland Kitchen Physically Abused:   . Sexually Abused:     Review of Systems: Review of Systems  Constitutional: Positive for malaise/fatigue. Negative for chills, fever and weight loss.  HENT: Negative for hearing loss and tinnitus.   Eyes: Negative for pain and redness.  Respiratory: Positive for cough and shortness of breath.   Cardiovascular: Positive for chest pain. Negative for palpitations.  Gastrointestinal: Positive  for nausea and vomiting. Negative for abdominal pain, blood in stool, constipation, diarrhea, heartburn and melena.  Genitourinary: Negative for flank pain and hematuria.  Musculoskeletal: Negative for falls and joint pain.  Skin: Negative for itching and rash.  Neurological: Negative for seizures and loss of consciousness.  Endo/Heme/Allergies: Negative for polydipsia. Bruises/bleeds easily.  Psychiatric/Behavioral: Negative for memory loss. The patient is not nervous/anxious.      Physical Exam: Vital signs: Vitals:   08/07/19 0817 08/07/19 0830  BP: (!) 133/67 (!) 140/73  Pulse: 98 97  Resp: 17 17  Temp: 98.2 F (36.8 C) 97.9 F (36.6 C)  SpO2: 98% 98%     Physical Exam Vitals and nursing note reviewed.  Constitutional:      General: She is not in acute distress.    Appearance: She is ill-appearing.  HENT:     Head: Normocephalic and atraumatic.     Nose: Nose normal.     Comments: [place] in place    Mouth/Throat:     Mouth: Mucous membranes are moist.     Pharynx: Oropharynx is clear.  Eyes:     General: Scleral icterus present.     Extraocular Movements: Extraocular movements intact.  Cardiovascular:     Rate and Rhythm: Normal rate and regular rhythm.     Pulses: Normal pulses.     Heart sounds: Normal heart sounds.  Pulmonary:     Effort: Tachypnea present.     Breath sounds: Decreased breath sounds (left) present.  Abdominal:     General: Bowel sounds are normal. There is distension (mild).     Palpations: Abdomen is soft. There is no mass.     Tenderness: There is no abdominal tenderness. There is no guarding or rebound.     Hernia: No hernia is present.  Musculoskeletal:        General: No swelling or tenderness.     Cervical back: Normal range of motion and neck supple.     Right lower leg: No edema.     Left lower leg: No edema.  Skin:    General: Skin is warm and dry.  Neurological:     General: No focal deficit present.     Mental Status: She is  oriented to person, place, and time. She is lethargic.  Psychiatric:        Mood and Affect: Mood normal.        Behavior: Behavior normal.      GI:  Lab Results: Recent Labs    08/07/19 0224  WBC 12.8*  HGB 4.5*  HCT 15.0*  PLT 115*   BMET Recent Labs    08/06/19 2055  NA 136  K 3.1*  CL 107  CO2 17*  GLUCOSE 149*  BUN 23  CREATININE 1.69*  CALCIUM 9.3   LFT Recent Labs    08/06/19 2055  PROT 5.1*  ALBUMIN 2.4*  AST 56*  ALT 34  ALKPHOS 180*  BILITOT 6.1*   PT/INR Recent Labs    08/07/19 0030  LABPROT 27.8*  INR 2.7*     Studies/Results: CT ANGIO CHEST PE W OR WO CONTRAST  Result Date: 08/06/2019 CLINICAL DATA:  Shortness of breath and chest pain EXAM: CT ANGIOGRAPHY CHEST WITH CONTRAST TECHNIQUE: Multidetector CT imaging of the chest was performed using the standard protocol during bolus administration of intravenous contrast. Multiplanar CT image reconstructions and MIPs were obtained to evaluate the vascular anatomy. CONTRAST:  118m OMNIPAQUE IOHEXOL 350 MG/ML SOLN COMPARISON:  07/31/2019 FINDINGS: Cardiovascular: Thoracic aorta and its branches are well visualized. No significant atherosclerotic calcifications are seen. No aneurysmal dilatation is noted. Coronary calcifications are seen. No cardiac enlargement is noted. The heart is deviated to the right related to large left pleural effusion. The pulmonary artery shows a normal branching pattern without definitive filling defect to suggest pulmonary embolism. The peripheral branches are somewhat limited in opacification due to timing of the contrast bolus. Mediastinum/Nodes: Thoracic inlet is within normal limits. No hilar or mediastinal adenopathy is noted. Mild mediastinal shift to the right is seen related to the large left pleural effusion. Esophagus is within normal limits. Lungs/Pleura: Previously seen small effusion now occupies the entire left chest cavity with consolidation of the left lung. No  pneumothorax is seen. Right lung shows no focal infiltrate or sizable effusion. Upper Abdomen: Hepatic and renal cysts are noted. Remainder of the upper abdomen is within normal limits. Musculoskeletal: Degenerative changes of the thoracic spine are noted. Multiple rib fractures are noted on the left involving the third, fourth, sixth, eighth and ninth ribs. No pneumothorax is noted. Review of the MIP images confirms the above findings. IMPRESSION: Multiple old rib fractures on the left. No evidence of pulmonary emboli. Large left pleural effusion with complete opacification of the left hemithorax. This is a significant increased from the most recent exam of 07/31/2019. Complete collapse of the left lung is noted. Electronically Signed   By: MInez CatalinaM.D.   On: 08/06/2019 23:36   ECHOCARDIOGRAM COMPLETE  Result Date: 08/07/2019    ECHOCARDIOGRAM REPORT   Patient Name:   Dana Watkins Date of Exam: 08/07/2019 Medical Rec #:  [id number]       Height:       68.0 in Accession #:    [id number]      Weight:       143.9 lb Date of Birth:  [birth year]      BSA:          1.777 m Patient Age:    89years         BP:           129/106 mmHg Patient Gender: F  HR:           99 bpm. Exam Location:  Inpatient Procedure: 2D Echo, Cardiac Doppler and Color Doppler Indications:     Acute Cornonary Syndrome I24.9  History:         Patient has prior history of Echocardiogram examinations, most                  recent 10/31/2018. CAD; Risk Factors:Hypertension and Former                  Smoker. DOE. Elevated troponin. MR.  Sonographer:     Vickie Epley RDCS Referring Phys:  [place] Diagnosing Phys: Dixie Dials MD IMPRESSIONS  1. Left ventricular ejection fraction, by estimation, is 40 to 45%. The left ventricle has mildly decreased function. The left ventricle demonstrates regional wall motion abnormalities (see scoring diagram/findings for description). Left ventricular diastolic parameters are  consistent with Grade I diastolic dysfunction (impaired relaxation). There is mild hypokinesis of the left ventricular, basal inferior wall and inferoseptal wall.  2. Right ventricular systolic function is normal. The right ventricular size is normal.  3. Left atrial size was mildly dilated.  4. The pericardial effusion is circumferential. Large pleural effusion in the left lateral region.  5. The mitral valve is normal in structure. Mild mitral valve regurgitation.  6. The aortic valve is tricuspid. Aortic valve regurgitation is not visualized. Mild aortic valve sclerosis is present, with no evidence of aortic valve stenosis.  7. The inferior vena cava is dilated in size with <50% respiratory variability, suggesting right atrial pressure of 15 mmHg. FINDINGS  Left Ventricle: Left ventricular ejection fraction, by estimation, is 40 to 45%. The left ventricle has mildly decreased function. The left ventricle demonstrates regional wall motion abnormalities. Mild hypokinesis of the left ventricular, basal inferior wall and inferoseptal wall. The left ventricular internal cavity size was normal in size. There is no left ventricular hypertrophy. Left ventricular diastolic parameters are consistent with Grade I diastolic dysfunction (impaired relaxation).  LV Wall Scoring: The basal inferolateral segment and basal inferior segment are hypokinetic. The entire anterior wall, antero-lateral wall, mid and distal lateral wall, entire septum, entire apex, and mid and distal inferior wall are normal. Right Ventricle: The right ventricular size is normal. No increase in right ventricular wall thickness. Right ventricular systolic function is normal. Left Atrium: Left atrial size was mildly dilated. Right Atrium: Right atrial size was normal in size. Pericardium: Trivial pericardial effusion is present. The pericardial effusion is circumferential. Mitral Valve: The mitral valve is normal in structure. Mild mitral valve  regurgitation. Tricuspid Valve: The tricuspid valve is normal in structure. Tricuspid valve regurgitation is mild. Aortic Valve: The aortic valve is tricuspid. Aortic valve regurgitation is not visualized. Mild aortic valve sclerosis is present, with no evidence of aortic valve stenosis. Pulmonic Valve: The pulmonic valve was normal in structure. Pulmonic valve regurgitation is not visualized. Aorta: The aortic root is normal in size and structure. Venous: The inferior vena cava is dilated in size with less than 50% respiratory variability, suggesting right atrial pressure of 15 mmHg. IAS/Shunts: The interatrial septum was not assessed. Additional Comments: There is a large pleural effusion in the left lateral region.  LEFT VENTRICLE PLAX 2D LVOT diam:     1.90 cm      Diastology LV SV:         74           LV e' lateral:   13.40 cm/s  LV SV Index:   42           LV E/e' lateral: 6.9 LVOT Area:     2.84 cm     LV e' medial:    10.10 cm/s                             LV E/e' medial:  9.2  LV Volumes (MOD) LV vol d, MOD A2C: 133.0 ml LV vol d, MOD A4C: 134.0 ml LV vol s, MOD A2C: 65.4 ml LV vol s, MOD A4C: 80.2 ml LV SV MOD A2C:     67.6 ml LV SV MOD A4C:     134.0 ml LV SV MOD BP:      63.0 ml RIGHT VENTRICLE RV S prime:     15.40 cm/s TAPSE (M-mode): 2.0 cm LEFT ATRIUM             Index       RIGHT ATRIUM           Index LA Vol (A2C):   65.3 ml 36.75 ml/m RA Area:     11.50 cm LA Vol (A4C):   43.1 ml 24.26 ml/m RA Volume:   27.80 ml  15.65 ml/m LA Biplane Vol: 53.8 ml 30.28 ml/m  AORTIC VALVE LVOT Vmax:   169.00 cm/s LVOT Vmean:  105.000 cm/s LVOT VTI:    0.262 m  AORTA Ao Root diam: 2.80 cm MITRAL VALVE MV Area (PHT): 3.60 cm    SHUNTS MV Decel Time: 211 msec    Systemic VTI:  0.26 m MR Peak grad: 120.6 mmHg   Systemic Diam: 1.90 cm MR Vmax:      549.00 cm/s MV E velocity: 92.70 cm/s MV A velocity: 80.50 cm/s MV E/A ratio:  1.15 Dixie Dials MD Electronically signed by Dixie Dials MD Signature Date/Time:  08/07/2019/9:21:14 AM    Final     Impression: Acute on chronic anemia, exacerbated by Heparin use and coagulopathy.  Most likely from portal hypertensive gastropathy, though patient also had mild esophagitis on recent EGD. -Hgb 4.5 today (7.9 as of 07/31/19) -Thrombocytopenia: Platelets 115K/uL -Continued ETOH use  Decompensated cirrhosis, MELD score of 29. -T. Bili 6.1/ AST 56/ ALT 34/ ALP 180 -INR 2.7  Acute coronary syndrome  CHF  Large left pleural effusion with complete collapse of left lung  CKD stage III  Plan: Octreotide bolus and IV infusion ordered as acute bleeding is likely from portal hypertensive gastropathy.  Protonix 40 mg IV twice daily ordered in case esophagitis is contributing, though suspect chest pain is related to ACS/left lung collapse.  Serial H&H with transfusion as needed to maintain hemoglobin greater than 7-8.  Supportive care with antiemetics as needed.  Consider diuretics after BP improves.  [facility] will follow.   LOS: 1 day   Salley Slaughter  PA-C 08/07/2019, 9:51 AM  Contact #  [phone]

## 2019-08-07 NOTE — Progress Notes (Signed)
CRITICAL VALUE ALERT  Critical Value:  Lactic 3.5  Date & Time Notied:  08/07/19 at 1715  Provider Notified: Dr. Vaughan Browner   Orders Received/Actions taken: See new orders  Also notified of K of 2.8

## 2019-08-07 NOTE — Progress Notes (Addendum)
PCCM progress  Patient started putting significantly bloody output from chest tube complaining of chest pain Chest tube taken off suction Status changed to ICU.  Will sign out to e link, night team to check on her Awaiting transfer to [facility] MD  Pulmonary and Critical Care Please see [email or web address] for pager details.  08/07/2019, 6:53 PM

## 2019-08-08 ENCOUNTER — Inpatient Hospital Stay (HOSPITAL_COMMUNITY): Payer: Medicare (Managed Care)

## 2019-08-08 ENCOUNTER — Encounter (HOSPITAL_COMMUNITY): Payer: Self-pay | Admitting: Cardiovascular Disease

## 2019-08-08 ENCOUNTER — Other Ambulatory Visit: Payer: Self-pay

## 2019-08-08 DIAGNOSIS — I249 Acute ischemic heart disease, unspecified: Secondary | ICD-10-CM | POA: Diagnosis not present

## 2019-08-08 DIAGNOSIS — J9620 Acute and chronic respiratory failure, unspecified whether with hypoxia or hypercapnia: Secondary | ICD-10-CM | POA: Diagnosis not present

## 2019-08-08 LAB — PREPARE FRESH FROZEN PLASMA
Unit division: 0
Unit division: 0
Unit division: 0

## 2019-08-08 LAB — CYTOLOGY - NON PAP

## 2019-08-08 LAB — CBC
HCT: 24.2 % — ABNORMAL LOW (ref 36.0–46.0)
HCT: 27.4 % — ABNORMAL LOW (ref 36.0–46.0)
Hemoglobin: 7.9 g/dL — ABNORMAL LOW (ref 12.0–15.0)
Hemoglobin: 8.7 g/dL — ABNORMAL LOW (ref 12.0–15.0)
MCH: 30.3 pg (ref 26.0–34.0)
MCH: 29.6 pg (ref 26.0–34.0)
MCHC: 32.6 g/dL (ref 30.0–36.0)
MCHC: 31.8 g/dL (ref 30.0–36.0)
MCV: 92.7 fL (ref 80.0–100.0)
MCV: 93.2 fL (ref 80.0–100.0)
Platelets: 58 10*3/uL — ABNORMAL LOW (ref 150–400)
Platelets: 76 10*3/uL — ABNORMAL LOW (ref 150–400)
RBC: 2.61 MIL/uL — ABNORMAL LOW (ref 3.87–5.11)
RBC: 2.94 MIL/uL — ABNORMAL LOW (ref 3.87–5.11)
RDW: 18 % — ABNORMAL HIGH (ref 11.5–15.5)
RDW: 18.5 % — ABNORMAL HIGH (ref 11.5–15.5)
WBC: 11.7 10*3/uL — ABNORMAL HIGH (ref 4.0–10.5)
WBC: 13.7 10*3/uL — ABNORMAL HIGH (ref 4.0–10.5)
nRBC: 0.3 % — ABNORMAL HIGH (ref 0.0–0.2)
nRBC: 0.2 % (ref 0.0–0.2)

## 2019-08-08 LAB — BPAM FFP
Blood Product Expiration Date: 202108012359
Blood Product Expiration Date: 202108012359
Blood Product Expiration Date: 202108012359
Blood Product Expiration Date: 202108012359
Blood Product Expiration Date: 202108012359
ISSUE DATE / TIME: 202107270439
ISSUE DATE / TIME: 202107270805
ISSUE DATE / TIME: 202107270805
ISSUE DATE / TIME: 202107272211
ISSUE DATE / TIME: 202107272346
Unit Type and Rh: 5100
Unit Type and Rh: 5100
Unit Type and Rh: 5100
Unit Type and Rh: 5100
Unit Type and Rh: 9500

## 2019-08-08 LAB — COMPREHENSIVE METABOLIC PANEL
ALT: 30 U/L (ref 0–44)
AST: 43 U/L — ABNORMAL HIGH (ref 15–41)
Albumin: 2.5 g/dL — ABNORMAL LOW (ref 3.5–5.0)
Alkaline Phosphatase: 129 U/L — ABNORMAL HIGH (ref 38–126)
Anion gap: 7 (ref 5–15)
BUN: 23 mg/dL (ref 8–23)
CO2: 18 mmol/L — ABNORMAL LOW (ref 22–32)
Calcium: 9.2 mg/dL (ref 8.9–10.3)
Chloride: 109 mmol/L (ref 98–111)
Creatinine, Ser: 1.23 mg/dL — ABNORMAL HIGH (ref 0.44–1.00)
GFR calc Af Amer: 54 mL/min — ABNORMAL LOW (ref >60)
GFR calc non Af Amer: 47 mL/min — ABNORMAL LOW (ref >60)
Glucose, Bld: 100 mg/dL — ABNORMAL HIGH (ref 70–99)
Potassium: 3.4 mmol/L — ABNORMAL LOW (ref 3.5–5.1)
Sodium: 134 mmol/L — ABNORMAL LOW (ref 135–145)
Total Bilirubin: 6.8 mg/dL — ABNORMAL HIGH (ref 0.3–1.2)
Total Protein: 4.9 g/dL — ABNORMAL LOW (ref 6.5–8.1)

## 2019-08-08 LAB — PROTIME-INR
INR: 1.9 — ABNORMAL HIGH (ref 0.8–1.2)
Prothrombin Time: 20.8 seconds — ABNORMAL HIGH (ref 11.4–15.2)

## 2019-08-08 LAB — LACTIC ACID, PLASMA: Lactic Acid, Venous: 1.4 mmol/L (ref 0.5–1.9)

## 2019-08-08 LAB — PREPARE RBC (CROSSMATCH)

## 2019-08-08 LAB — PROCALCITONIN: Procalcitonin: <0.1 ng/mL

## 2019-08-08 MED ORDER — POTASSIUM CHLORIDE 10 MEQ/100ML IV SOLN
10.0000 meq | Freq: Once | INTRAVENOUS | Status: AC
  Administered 2019-08-08: 10 meq via INTRAVENOUS

## 2019-08-08 MED ORDER — PANTOPRAZOLE SODIUM 40 MG PO TBEC
40.0000 mg | DELAYED_RELEASE_TABLET | Freq: Every day | ORAL | Status: DC
  Administered 2019-08-08 – 2019-08-14 (×7): 40 mg via ORAL
  Filled 2019-08-08 (×8): qty 1

## 2019-08-08 MED ORDER — TRAMADOL HCL 50 MG PO TABS
50.0000 mg | ORAL_TABLET | Freq: Four times a day (QID) | ORAL | Status: AC | PRN
  Administered 2019-08-08 – 2019-08-12 (×2): 50 mg via ORAL
  Filled 2019-08-08 (×3): qty 1

## 2019-08-08 NOTE — Progress Notes (Signed)
E-Link notified of new onset of fine crackles in bases after blood product administrations.

## 2019-08-08 NOTE — Progress Notes (Signed)
[facility] Progress Note Patient Name: Dana Watkins DOB: [birth year] MRN: [id number]   Date of Service  08/08/2019  HPI/Events of Note  Request for diuretics as patient has received blood products and now with crackles on exam. Patient seen comfortable lying almost flat in bed. Ongoing PRBC transfusion. Received K replacements as well  eICU Interventions  K 2.8, H/H 6.5/20.6 Will await repeat labs before ordering diuretics so as not to cause worsening of hypokalemia. Consider aldactone     Intervention Category Intermediate Interventions: Other:  Judd Lien 08/08/2019, 2:19 AM

## 2019-08-08 NOTE — Progress Notes (Addendum)
NAME:  Dana Watkins, MRN:  [id number], DOB:  Dec 15, 1955, LOS: 2 ADMISSION DATE:  08/06/2019, CONSULTATION DATE: 08/07/19 REFERRING MD:  Jeanne Ivan MD, CHIEF COMPLAINT:  Dyspnea, pleural effusion  Brief History   63 with HTN, CAD, CKD, migraines, mitral regurg, pancytopenia, cirrhosis Recently evaluated in the ED on 7/28 for bronchitis with left pleural effusion. Treated with azithromycin with no improvement Came back to the ED with worsening dyspnea, chest discomfort. Chest x-ray with significantly increased large left effusion. Initially started on IV heparin for suspected ACS but subsequently stopped after she was found to have a hemoglobin of 4.5 with elevated INR. PCCM consulted  Past Medical History    has a past medical history of Acute hypokalemia (11/26/2014), Arthritis (05/15/2015), Benign essential hypertension (05/15/2015), C. difficile colitis, CAD (coronary artery disease), CKD (chronic kidney disease), stage III, Hypokalemia, Migraine headache (05/15/2015), Mild mitral regurgitation, Pancytopenia ([place]), Sepsis ([place]) (01/2017), Thrombocytopenia ([place]), and Vitamin D deficiency.  Significant Hospital Events   7/27 Admit 7/28 Evacuation of hemothorax on CXR, no transfer to [facility]   Consults:  PCCM  Procedures:  Left Chest Tube 7/27 >>   Significant Diagnostic Tests:  Chest x-ray 07/31/2019-mild left base atelectasis with pleural effusion CT chest 02/06/2019- No PE, large effusion with left hemithorax opacification and collapse of left lung.  Micro Data:    Antimicrobials:  Meropenem 7/27 >>  Vanco 7/27 >> 7/28  Interim history/subjective:  No acute events  RN reports ~ 150 ml drainage from chest tube, serosanguinous  Pt reports left sided rib pain Afebrile   Objective   Blood pressure (!) 113/46, pulse 73, temperature 98.6 F (37 C), temperature source Oral, resp. rate 19, height 5' 8"  (1.727 m), weight 71.5 kg, SpO2 (!) 88 %.        Intake/Output Summary (Last 24  hours) at 08/08/2019 1036 Last data filed at 08/08/2019 0800 Gross per 24 hour  Intake 4802.69 ml  Output 3820 ml  Net 982.69 ml   Filed Weights   08/07/19 1433 08/08/19 0459  Weight: 69 kg 71.5 kg    Examination: General: thin, adult female lying in bed in NAD, jaundice HEENT: MM pink/moist, icterus  Neuro: AAOx4, speech clear, MAE  CV: s1s2 RRR, no m/r/g PULM: non-labored on [place] O2, lungs bilaterally clear anterior, diminished left base  GI: soft, bsx4 active  Extremities: warm/dry, no edema  Skin: thin dry skin, multiple areas of ecchymosis  Resolved Hospital Problem list     Assessment & Plan:   Left pleural effusion, Hemothorax  Significantly increased since 7/20 when she was assessed in the ED suspected hemothorax given low hemoglobin and hx of ETOH abuse / high risk for falls. PCT negative.  -continue left chest tube  -reviewed per CVTS, no indication for transfer or surgery at this time  -continue empiric antibiotics, stop vanco -follow pleural studies  -follow daily CXR while chest tube in place -once CT drainage <150-200 ml in 24 hours, consider removal   Anemia EGD on 06/13/2019 with portal gastropathy and no varices.  Follows with Dr. Paulita Fujita.  -appreciate GI evaluation   Cirrhosis with portal hypertension EtOH abuse Appears decompensated with worsening bilirubin and INR -supportive care, appreciate GI  -ok to eat from pulmonary standpoint  Pancytopenia secondary to EtOH, cirrhosis Coagulopathy  Gets Aranesp as an outpatient from Dr. Lindi Adie -follow CBC, INR  Chronic kidney disease -Trend BMP / urinary output -Replace electrolytes as indicated -Avoid nephrotoxic agents, ensure adequate renal perfusion  Elevated troponin Suspect  demand ischemia -hold heparin / anticoagulation given bleeding  -troponin trend clearing   Best practice:  Diet: as tolerated Pain/Anxiety/Delirium protocol (if indicated): NA VAP protocol (if indicated): NA DVT  prophylaxis: SCDs GI prophylaxis: PPI Glucose control: [place] Mobility: Bed Code Status: Full Family Communication: patient updated on plan of care 7/28 Disposition: SDU  Labs   CBC: Recent Labs  Lab 08/07/19 0224 08/07/19 1531 08/08/19 0613  WBC 12.8* 12.0* 11.7*  NEUTROABS 10.2*  --   --   HGB 4.5* 6.5* 7.9*  HCT 15.0* 20.6* 24.2*  MCV 97.4 94.1 92.7  PLT 115* 88* 58*    Basic Metabolic Panel: Recent Labs  Lab 08/06/19 2055 08/07/19 1531 08/08/19 0613  NA 136 135 134*  K 3.1* 2.8* 3.4*  CL 107 106 109  CO2 17* 20* 18*  GLUCOSE 149* 127* 100*  BUN 23 27* 23  CREATININE 1.69* 1.82* 1.23*  CALCIUM 9.3 9.2 9.2   GFR: Estimated Creatinine Clearance: 47.2 mL/min (A) (by C-G formula based on SCr of 1.23 mg/dL (H)). Recent Labs  Lab 08/07/19 0224 08/07/19 1531 08/08/19 0613 08/08/19 0650  PROCALCITON  --  <0.10 <0.10  --   WBC 12.8* 12.0* 11.7*  --   LATICACIDVEN  --  3.6*  --  1.4    Liver Function Tests: Recent Labs  Lab 08/06/19 2055 08/07/19 1531 08/08/19 0613  AST 56*  --  43*  ALT 34  --  30  ALKPHOS 180*  --  129*  BILITOT 6.1*  --  6.8*  PROT 5.1* 5.5* 4.9*  ALBUMIN 2.4*  --  2.5*   No results for input(s): LIPASE, AMYLASE in the last 168 hours. No results for input(s): AMMONIA in the last 168 hours.  ABG    Component Value Date/Time   HCO3 16.9 (L) 10/30/2018 1810   ACIDBASEDEF 7.0 (H) 10/30/2018 1810   O2SAT 59.8 10/30/2018 1810     Coagulation Profile: Recent Labs  Lab 08/07/19 0030 08/07/19 1539 08/07/19 2234 08/08/19 0613  INR 2.7* 2.3* 2.1* 1.9*    Cardiac Enzymes: No results for input(s): CKTOTAL, CKMB, CKMBINDEX, TROPONINI in the last 168 hours.  HbA1C: No results found for: HGBA1C  CBG: No results for input(s): GLUCAP in the last 168 hours.      Critical care time: NA    Noe Gens, MSN, NP-C [facility] 08/08/2019, 10:36 AM   Please see [email or web address] for pager details.

## 2019-08-08 NOTE — Progress Notes (Signed)
[facility] Gastroenterology Progress Note  Dana Watkins 64 y.o. [birth year]  CC:  Anemia  Subjective: Patient reports feeling much better.  Her breathing has improved but she is still having left-sided chest/rib pain. Denies any abdominal pain, nausea, or vomiting today.  Has not had a bowel movement since admission.  ROS : Review of Systems  Respiratory: Positive for shortness of breath. Negative for cough.   Cardiovascular: Positive for chest pain. Negative for palpitations.  Gastrointestinal: Negative for abdominal pain, blood in stool, constipation, diarrhea, heartburn, melena, nausea and vomiting.    Objective: Vital signs in last 24 hours: Vitals:   08/08/19 0932 08/08/19 0953  BP:    Pulse:    Resp:    Temp:    SpO2: 90% (!) 88%    Physical Exam:  General:  Alert, oriented, cooperative, no distress  Head:  Normocephalic, without obvious abnormality, atraumatic  Eyes:  Conjunctival pallor, EOMs intact  Lungs:   Breathing comfortably; no respiratory distress; chest tube in place with serosanguinous drainage  Heart:  Regular rate and rhythm, S1, S2 normal  Abdomen:   Soft, non-tender, non-distended, bowel sounds active all four quadrants, no guarding or peritoneal signs  Extremities: Extremities normal, atraumatic, no  edema  Pulses: 2+ and symmetric    Lab Results: Recent Labs    08/07/19 1531 08/08/19 0613  NA 135 134*  K 2.8* 3.4*  CL 106 109  CO2 20* 18*  GLUCOSE 127* 100*  BUN 27* 23  CREATININE 1.82* 1.23*  CALCIUM 9.2 9.2   Recent Labs    08/06/19 2055 08/06/19 2055 08/07/19 1531 08/08/19 0613  AST 56*  --   --  43*  ALT 34  --   --  30  ALKPHOS 180*  --   --  129*  BILITOT 6.1*  --   --  6.8*  PROT 5.1*   < > 5.5* 4.9*  ALBUMIN 2.4*  --   --  2.5*   < > = values in this interval not displayed.   Recent Labs    08/07/19 0224 08/07/19 0224 08/07/19 1531 08/08/19 0613  WBC 12.8*   < > 12.0* 11.7*  NEUTROABS 10.2*  --   --   --   HGB  4.5*   < > 6.5* 7.9*  HCT 15.0*   < > 20.6* 24.2*  MCV 97.4   < > 94.1 92.7  PLT 115*   < > 88* 58*   < > = values in this interval not displayed.   Recent Labs    08/07/19 2234 08/08/19 0613  LABPROT 22.7* 20.8*  INR 2.1* 1.9*   Acute on chronic anemia, exacerbated by Heparin use and coagulopathy. Anemia most likely due to hemothorax, though patient had portal hypertensive gastropathy and mild esophagitis on recent EGD. -Hgb 7.9 today, improved from 4.5 yesterday after 3u pRBCs -Thrombocytopenia: Platelets 115K/uL -Continued ETOH use  Decompensated cirrhosis, MELD score of 29 as of 7/27. -T bili 6.8/AST 43/ALT 30/ALP 129 today as compared to T. Bili 6.1/ AST 56/ ALT 34/ ALP 180 yesterday -INR 1.9 today, improved from 2.7 yesterday after 5u FFP   Acute coronary syndrome  CHF  Large left pleural effusion with complete collapse of left lung s/p chest tube placement with large amount of bloody output  CKD stage III: BUN 23/ Cr 1.23  Plan: Continue Protonix PO BID while inpatient (once discharged, resume outpatient regimen of Use Prilosec (omeprazole) 20 mg PO daily)  Continue to monitor H&H with  transfusion as needed to maintain hemoglobin greater than 7-8.  If OK per critical care team, consider initiation of Lasix 40 mg and Spironolcatone 100 mg.  Patient to follow up with Dr. Paulita Fujita as an outpatient for further management of cirrhosis.  [facility] will sign off.  Please contact us if we can be of any further assistance during this hospital stay.  Salley Slaughter PA-C 08/08/2019, 12:23 PM  Contact #  [phone]

## 2019-08-08 NOTE — Progress Notes (Signed)
Patient's second unit of PRBCs would not let this RN scan into Epic due to Unit being completed in the chart by error. Order to transfuse verified by this RN and Bethanne Ginger, RN. Blood bank also verified unit via the requisition form.  Blood administration flowsheet being completed.  Unit: [id number] being administered into patient's LFA 20g.  Transfusion begin time: 0122 AM.

## 2019-08-08 NOTE — Progress Notes (Signed)
Ref: Lawerance Cruel, MD   Subjective:  Feeling better post chest tube placement. VS are stable. Decreasing respiratory distress. Left lung is expanded.   INR is 1.9. Hgb is 7.9 post 2 units of PRBC transfusion.  Potassium is 3.4 mmol. Creatinine is 1.23, Bilirubin is 6.8 mg. Albumin is 2.5 gm.  Objective:  Vital Signs in the last 24 hours: Temp:  [97.8 F (36.6 C)-98.6 F (37 C)] 98.6 F (37 C) (07/28 0800) Pulse Rate:  [73-98] 73 (07/28 0900) Cardiac Rhythm: Normal sinus rhythm (07/28 0800) Resp:  [13-28] 19 (07/28 0900) BP: (91-160)/(46-79) 113/46 (07/28 0900) SpO2:  [95 %-100 %] 98 % (07/28 0900) Weight:  [69 kg-71.5 kg] 71.5 kg (07/28 0459)  Physical Exam: BP Readings from Last 1 Encounters:  08/08/19 (!) 113/46     Wt Readings from Last 1 Encounters:  08/08/19 71.5 kg    Weight change:  Body mass index is 23.97 kg/m. HEENT: [place]/AT, Eyes-Blue, PERL, EOMI, Conjunctiva-Pale, Sclera-icteric Neck: No JVD, No bruit, Trachea midline. Lungs:  Clearing, Bilateral. Left sided chest tube in place. Cardiac:  Regular rhythm, normal S1 and S2, no S3. II/VI systolic murmur. Abdomen:  Soft, non-tender. BS present. Extremities:  No edema present. No cyanosis. No clubbing. CNS: AxOx3, Cranial nerves grossly intact, moves all 4 extremities.  Skin: Warm and dry. Icteric.   Intake/Output from previous day: 07/27 0701 - 07/28 0700 In: 3962.2 [Blood:1350; IV Piggyback:1412.2] Out: 3670 [Urine:750; Chest Tube:2920]    Lab Results: BMET    Component Value Date/Time   NA 134 (L) 08/08/2019 0613   NA 135 08/07/2019 1531   NA 136 08/06/2019 2055   NA 139 02/15/2019 1012   NA 145 (H) 05/25/2017 1419   NA 142 05/09/2017 1533   NA 141 11/25/2014 1532   NA 141 05/13/2014 0847   K 3.4 (L) 08/08/2019 0613   K 2.8 (L) 08/07/2019 1531   K 3.1 (L) 08/06/2019 2055   K 2.7 (LL) 11/25/2014 1532   K 3.1 (L) 05/13/2014 0847   CL 109 08/08/2019 0613   CL 106 08/07/2019 1531   CL 107  08/06/2019 2055   CO2 18 (L) 08/08/2019 0613   CO2 20 (L) 08/07/2019 1531   CO2 17 (L) 08/06/2019 2055   CO2 22 11/25/2014 1532   CO2 20 (L) 05/13/2014 0847   GLUCOSE 100 (H) 08/08/2019 0613   GLUCOSE 127 (H) 08/07/2019 1531   GLUCOSE 149 (H) 08/06/2019 2055   GLUCOSE 113 11/25/2014 1532   GLUCOSE 105 05/13/2014 0847   BUN 23 08/08/2019 0613   BUN 27 (H) 08/07/2019 1531   BUN 23 08/06/2019 2055   BUN 11 02/15/2019 1012   BUN 15 05/25/2017 1419   BUN 14 05/09/2017 1533   BUN 16.4 11/25/2014 1532   BUN 16.2 05/13/2014 0847   CREATININE 1.23 (H) 08/08/2019 0613   CREATININE 1.82 (H) 08/07/2019 1531   CREATININE 1.69 (H) 08/06/2019 2055   CREATININE 1.36 (H) 07/12/2019 1122   CREATININE 1.29 (H) 07/05/2019 1507   CREATININE 1.30 (H) 04/16/2019 1427   CREATININE 1.4 (H) 11/25/2014 1532   CREATININE 1.2 (H) 05/13/2014 0847   CALCIUM 9.2 08/08/2019 0613   CALCIUM 9.2 08/07/2019 1531   CALCIUM 9.3 08/06/2019 2055   CALCIUM 10.4 11/25/2014 1532   CALCIUM 9.3 05/13/2014 0847   GFRNONAA 47 (L) 08/08/2019 0613   GFRNONAA 29 (L) 08/07/2019 1531   GFRNONAA 32 (L) 08/06/2019 2055   GFRNONAA 41 (L) 07/12/2019 1122   GFRNONAA  44 (L) 07/05/2019 1507   GFRNONAA 44 (L) 04/16/2019 1427   GFRAA 54 (L) 08/08/2019 0613   GFRAA 34 (L) 08/07/2019 1531   GFRAA 37 (L) 08/06/2019 2055   GFRAA 48 (L) 07/12/2019 1122   GFRAA 51 (L) 07/05/2019 1507   GFRAA 51 (L) 04/16/2019 1427   CBC    Component Value Date/Time   WBC 11.7 (H) 08/08/2019 0613   RBC 2.61 (L) 08/08/2019 0613   HGB 7.9 (L) 08/08/2019 0613   HGB 9.3 (L) 07/12/2019 1122   HGB 7.9 (L) 02/15/2019 1012   HGB 12.6 11/25/2014 1532   HCT 24.2 (L) 08/08/2019 0613   HCT 23.2 (L) 02/15/2019 1012   HCT 38.5 11/25/2014 1532   PLT 58 (L) 08/08/2019 0613   PLT 55 (L) 07/12/2019 1122   PLT 82 (LL) 02/15/2019 1012   MCV 92.7 08/08/2019 0613   MCV 84 02/15/2019 1012   MCV 87.9 11/25/2014 1532   MCH 30.3 08/08/2019 0613   MCHC 32.6  08/08/2019 0613   RDW 18.0 (H) 08/08/2019 0613   RDW 17.0 (H) 02/15/2019 1012   RDW 15.9 (H) 11/25/2014 1532   LYMPHSABS 1.3 08/07/2019 0224   LYMPHSABS 1.2 02/15/2019 1012   LYMPHSABS 1.2 11/25/2014 1532   MONOABS 1.2 (H) 08/07/2019 0224   MONOABS 0.4 11/25/2014 1532   EOSABS 0.0 08/07/2019 0224   EOSABS 0.2 02/15/2019 1012   BASOSABS 0.0 08/07/2019 0224   BASOSABS 0.1 02/15/2019 1012   BASOSABS 0.0 11/25/2014 1532   HEPATIC Function Panel Recent Labs    08/06/19 2055 08/07/19 1531 08/08/19 0613  PROT 5.1* 5.5* 4.9*   HEMOGLOBIN A1C No components found for: HGA1C,  MPG CARDIAC ENZYMES Lab Results  Component Value Date   CKTOTAL 51 12/08/2018   TROPONINI 0.03 (HH) 06/25/2018   BNP No results for input(s): PROBNP in the last 8760 hours. TSH Recent Labs    12/09/18 1518 02/15/19 1012  TSH 0.752 1.140   CHOLESTEROL Recent Labs    08/07/19 1531  CHOL 170    Scheduled Meds: . Chlorhexidine Gluconate Cloth  6 each Topical Daily  . folic acid  1 mg Oral Daily  . magnesium oxide  400 mg Oral Daily  . mouth rinse  15 mL Mouth Rinse BID  . multivitamin with minerals  1 tablet Oral Daily  . pantoprazole (PROTONIX) IV  40 mg Intravenous Q12H  . phytonadione  5 mg Oral Daily  . potassium chloride  20 mEq Oral BID  . sodium chloride flush  10 mL Intracatheter Q8H   Continuous Infusions: . sodium chloride 20 mL/hr at 08/07/19 0730  . lactated ringers 100 mL/hr at 08/08/19 0800  . meropenem (MERREM) IV Stopped (08/08/19 0005)  . vancomycin     PRN Meds:.acetaminophen, morphine injection, nitroGLYCERIN, ondansetron (ZOFRAN) IV  Assessment/Plan: Acute coronary syndrome Large left pleural effusion S/P left lung chest tube with drainage Early left heart systolic failure, compensated Early acute respiratory failure, improving Liver cirrhosis with jaundice Abnormal PT/INR Alcohol use disorder Anemia of blood loss Pancytopenia Hypokalemia, improving CKD,  IIIa  Continue current and medical treatment. Continue vitamin K supplement Continue potassium supplement. Patient is not a candidate for cardiac intervention as she will not tolerate antiplatelets therapy.   LOS: 2 days   Time spent including chart review, lab review, examination, discussion with patient : 30 min   Dixie Dials  MD  08/08/2019, 9:22 AM

## 2019-08-08 NOTE — TOC Initial Note (Addendum)
Transition of Care [facility]) - Initial/Assessment Note    Patient Details  Name: Dana Watkins MRN: [id number] Date of Birth: [birth year]  [facility] CM/SW Contact:    Leeroy Cha, RN Phone Number: 08/08/2019, 8:24 AM  Clinical Narrative:                 s- hemothorax, chest inserted suction dcd after bldy outpt. To drainage. From chart=64 years old female with PMH of CAD, arthritis, liver cirrhosis, CKD, stage IIIb, hypokalemia,vitamin D deficiency and pancytopenia has recent ER visit for acute bronchitis. Her chest pain is pleuritic, under left breast, non-radiating but with shortness of breath with activity. She has mild vascular congestion on chest x-ray. Her EKG shows SR with diffuse ST-T changes. Her HS-Troponin I has peaked at 245 ng. Her BNP is elevated at 442.8 pg. Her CBC with diff is pending but 1 week back her Hgb was 7.9 gm, WBC count of 4.9K and platelets count of 56K. Her potassium is 3.1 meq. Her Creatinine is 1.69. Her Total bilirubin is elevated at 6.1 mg. Awaiting transfer to [facility]. p-Will assist with pending transfer to [facility].  Expected Discharge Plan: Home/Self Care Barriers to Discharge: Continued Medical Work up   Patient Goals and CMS Choice Patient states their goals for this hospitalization and ongoing recovery are:: to go home and be better CMS Medicare.gov Compare Post Acute Care list provided to:: Patient    Expected Discharge Plan and Services Expected Discharge Plan: Home/Self Care   Discharge Planning Services: CM Consult   Living arrangements for the past 2 months: Apartment                                      Prior Living Arrangements/Services Living arrangements for the past 2 months: Apartment Lives with:: Self Patient language and need for interpreter reviewed:: Yes Do you feel safe going back to the place where you live?: Yes      Need for Family Participation in Patient Care: Yes (Comment) Care giver support  system in place?: Yes (comment)   Criminal Activity/Legal Involvement Pertinent to Current Situation/Hospitalization: No - Comment as needed  Activities of Daily Living Home Assistive Devices/Equipment: Eyeglasses ADL Screening (condition at time of admission) Patient's cognitive ability adequate to safely complete daily activities?: Yes Is the patient deaf or have difficulty hearing?: No Does the patient have difficulty seeing, even when wearing glasses/contacts?: No Does the patient have difficulty concentrating, remembering, or making decisions?: No Patient able to express need for assistance with ADLs?: Yes Does the patient have difficulty dressing or bathing?: No Independently performs ADLs?: Yes (appropriate for developmental age) Does the patient have difficulty walking or climbing stairs?: No Weakness of Legs: None Weakness of Arms/Hands: None  Permission Sought/Granted                  Emotional Assessment Appearance:: Appears stated age Attitude/Demeanor/Rapport: Engaged Affect (typically observed): Calm Orientation: : Oriented to Place, Oriented to Self, Oriented to  Time, Oriented to Situation Alcohol / Substance Use: Not Applicable Psych Involvement: No (comment)  Admission diagnosis:  Acute coronary syndrome (HCC) [I24.9] Elevated troponin [R77.8] NSTEMI (non-ST elevated myocardial infarction) ([place]) [I21.4] Acute on chronic respiratory failure ([place]) [J96.20] Patient Active Problem List   Diagnosis Date Noted  . Acute on chronic respiratory failure ([place]) 08/07/2019  . Acute coronary syndrome ([place]) 08/06/2019  . Anemia due to stage  3 chronic kidney disease 04/16/2019  . Fever 02/21/2019  . Hypophosphatemia   . Recurrent Clostridioides difficile diarrhea 12/11/2018  . Weakness 12/09/2018  . Hyperbilirubinemia 10/31/2018  . C. difficile diarrhea 10/31/2018  . Other cirrhosis of liver ([place]) 10/31/2018  . Systolic murmur at cardiac apex 10/31/2018  . Sepsis  ([place]) 10/30/2018  . Normocytic anemia 10/23/2018  . SIRS (systemic inflammatory response syndrome) ([place]) 10/16/2018  . Acute renal failure superimposed on stage 3 chronic kidney disease ([place]) 10/16/2018  . Acute lower UTI 10/16/2018  . Elevated troponin 10/16/2018  . Elevated d-dimer 10/16/2018  . UTI (urinary tract infection) 06/25/2018  . Community acquired pneumonia of right middle lobe of lung 06/09/2018  . AKI (acute kidney injury) ([place]) 06/09/2018  . GAD (generalized anxiety disorder) 11/06/2017  . Abnormal findings on diagnostic imaging of heart and coronary circulation   . Family history of coronary artery disease 05/09/2017  . DOE (dyspnea on exertion) 05/09/2017  . Coronary artery calcification seen on CT scan 03/08/2017  . Acute bronchitis 02/06/2017  . Dehydration   . Diarrhea 02/03/2017  . Nausea & vomiting 02/02/2017  . Arthritis 05/15/2015  . Benign essential hypertension 05/15/2015  . Migraine headache 05/15/2015  . Hypokalemia 11/26/2014  . Alkaline phosphatase elevation 11/26/2014  . Abnormal weight gain 11/26/2014  . Chronic leukopenia 05/28/2014  . Anemia of chronic illness 05/28/2014  . Thrombocytopenia ([place]) 04/30/2013  . Elevated liver function tests 04/30/2013   PCP:  Lawerance Cruel, MD Pharmacy:   [facility], [place] [address] Phone: [phone] Fax: [facility] [place], [place] [address] Phone: [phone] Fax: [phone]  [facility] [address], [place] [address] [place] [address] Phone: [phone] Fax: [phone]  [place], [place] [address] Phone: [phone] Fax: [phone]     Social Determinants of  Health (SDOH) Interventions    Readmission Risk Interventions Readmission Risk Prevention Plan 12/11/2018  Transportation Screening Complete  PCP or Specialist Appt within 3-5 Days Complete  HRI or [place] Complete  Social Work Consult for [place] Planning/Counseling Complete  Palliative Care Screening Not Applicable  Medication Review (RN Care Manager) Referral to Pharmacy  Some recent data might be hidden

## 2019-08-09 DIAGNOSIS — J9620 Acute and chronic respiratory failure, unspecified whether with hypoxia or hypercapnia: Secondary | ICD-10-CM | POA: Diagnosis not present

## 2019-08-09 DIAGNOSIS — I249 Acute ischemic heart disease, unspecified: Secondary | ICD-10-CM | POA: Diagnosis not present

## 2019-08-09 LAB — BASIC METABOLIC PANEL
Anion gap: 6 (ref 5–15)
BUN: 26 mg/dL — ABNORMAL HIGH (ref 8–23)
CO2: 20 mmol/L — ABNORMAL LOW (ref 22–32)
Calcium: 9.6 mg/dL (ref 8.9–10.3)
Chloride: 109 mmol/L (ref 98–111)
Creatinine, Ser: 1.33 mg/dL — ABNORMAL HIGH (ref 0.44–1.00)
GFR calc Af Amer: 49 mL/min — ABNORMAL LOW (ref >60)
GFR calc non Af Amer: 42 mL/min — ABNORMAL LOW (ref >60)
Glucose, Bld: 105 mg/dL — ABNORMAL HIGH (ref 70–99)
Potassium: 3.4 mmol/L — ABNORMAL LOW (ref 3.5–5.1)
Sodium: 135 mmol/L (ref 135–145)

## 2019-08-09 LAB — PROCALCITONIN: Procalcitonin: <0.1 ng/mL

## 2019-08-09 LAB — PROTIME-INR
INR: 2.1 — ABNORMAL HIGH (ref 0.8–1.2)
Prothrombin Time: 22.7 seconds — ABNORMAL HIGH (ref 11.4–15.2)

## 2019-08-09 MED ORDER — PHYTONADIONE 5 MG PO TABS
10.0000 mg | ORAL_TABLET | Freq: Every day | ORAL | Status: AC
  Administered 2019-08-09 – 2019-08-10 (×2): 10 mg via ORAL
  Filled 2019-08-09 (×2): qty 2

## 2019-08-09 MED ORDER — POTASSIUM CHLORIDE CRYS ER 20 MEQ PO TBCR
20.0000 meq | EXTENDED_RELEASE_TABLET | Freq: Three times a day (TID) | ORAL | Status: DC
  Administered 2019-08-09 – 2019-08-14 (×15): 20 meq via ORAL
  Filled 2019-08-09 (×15): qty 1

## 2019-08-09 MED ORDER — SPIRONOLACTONE 12.5 MG HALF TABLET
12.5000 mg | ORAL_TABLET | Freq: Every day | ORAL | Status: DC
  Administered 2019-08-09 – 2019-08-14 (×6): 12.5 mg via ORAL
  Filled 2019-08-09 (×6): qty 1

## 2019-08-09 NOTE — Progress Notes (Addendum)
NAME:  Dana Watkins, MRN:  [id number], DOB:  1955/03/15, LOS: 3 ADMISSION DATE:  08/06/2019, CONSULTATION DATE: 08/07/19 REFERRING MD:  Jeanne Ivan MD, CHIEF COMPLAINT:  Dyspnea, pleural effusion  Brief History   63 with HTN, CAD, CKD, migraines, mitral regurg, pancytopenia, cirrhosis Recently evaluated in the ED on 7/28 for bronchitis with left pleural effusion. Treated with azithromycin with no improvement Came back to the ED with worsening dyspnea, chest discomfort. Chest x-ray with significantly increased large left effusion. Initially started on IV heparin for suspected ACS but subsequently stopped after she was found to have a hemoglobin of 4.5 with elevated INR. PCCM consulted  Past Medical History    has a past medical history of Acute hypokalemia (11/26/2014), Arthritis (05/15/2015), Benign essential hypertension (05/15/2015), C. difficile colitis, CAD (coronary artery disease), CKD (chronic kidney disease), stage III, Hypokalemia, Migraine headache (05/15/2015), Mild mitral regurgitation, Pancytopenia ([place]), Sepsis ([place]) (01/2017), Thrombocytopenia ([place]), and Vitamin D deficiency.  Significant Hospital Events   7/27 Admit 7/28 Evacuation of hemothorax on CXR, no transfer to [facility]   Consults:  PCCM  Procedures:  Left Chest Tube 7/27 >>   Significant Diagnostic Tests:  Chest x-ray 07/31/2019-mild left base atelectasis with pleural effusion CT chest 02/06/2019- No PE, large effusion with left hemithorax opacification and collapse of left lung. ECHO 7/27 > LVEF 40-45%, LV with regional wall motion abnormalities, Grade I diastolic dysfunction, mild hypokinesis of the left ventricular basal inferior wall and inferoseptal wall.  LA mildly dilated, circumferential pericardial effusion, large pleural effusion in the left lateral region, mild mitral valve regurgitation Pleural Cytology 7/27 >> atypical cells, acute inflammation  CT Chest w/o 7/28 >> significant reduction in left-sided  hemothorax following catheter placement, significant improvement in aeration of left lung with residual small effusion and lower lobe consolidation.  New patchy airspace opacity in the right lung with edema likely related to re-expansion with reduction of the mediastinal shift.  Small right sided pleural effusion and lower lobe consolidation, new ascites in the upper abdomen adjacent to the liver  Micro Data:  COVID 7/26 >> negative Pleural Fluid 7/27 >> abundant WBC >>   Antimicrobials:  Meropenem 7/27 >>  Vanco 7/27 >> 7/28  Interim history/subjective:  Pt denies pain, SOB Chest tube drainage 9105m in last 24 hours, fluid more yellow / cleared  Afebrile   Objective   Blood pressure (!) 118/50, pulse 81, temperature 98.1 F (36.7 C), temperature source Oral, resp. rate 20, height 5' 8"  (1.727 m), weight 71.5 kg, SpO2 97 %.        Intake/Output Summary (Last 24 hours) at 08/09/2019 1006 Last data filed at 08/09/2019 0630 Gross per 24 hour  Intake 3547.84 ml  Output 1950 ml  Net 1597.84 ml   Filed Weights   08/07/19 1433 08/08/19 0459  Weight: 69 kg 71.5 kg    Examination: General: thin, adult female sitting up in bed in NAD, jaundice  HEENT: MM pink/moist, icterus, fair dentition, [place] O2 Neuro: AAOx4, speech clear, MAE CV: s1s2 RRR, SEM  PULM: non-labored on [place], lungs bilaterally clear, diminished left base GI: soft, bsx4 active  Extremities: warm/dry, no edema  Skin: thin skin, dry, multiple areas of ecchymosis   Resolved Hospital Problem list     Assessment & Plan:   Left pleural effusion, Hemothorax  Significantly increased since 7/20 when she was assessed in the ED suspected hemothorax given low hemoglobin and hx of ETOH abuse / high risk for falls. PCT negative. Atypical cells thought  to be reactive mesothelial cells on pleural cytology.   -continue left chest tube, monitor drainage -once CT drainage below 264m in 24 hours will consider removal  -send repeat  pleural cytology given atypical cells  -imaging reviewed per CVTS, no indication for surgical intervention at this time  -follow pleural culture to maturity   -follow CXR while chest tube in place  -continue abx, D3/x    Anemia EGD on 06/13/2019 with portal gastropathy and no varices.  Follows with Dr. OPaulita Fujita  -appreciate GI input   Cirrhosis with portal hypertension EtOH abuse Appears decompensated with worsening bilirubin and INR -per GI / primary  -vitamin K, spironolactone per primary  -monitor for evidence of withdrawal   Pancytopenia secondary to EtOH, cirrhosis Coagulopathy  Gets Aranesp as an outpatient from Dr. GLindi Adie-follow coags, CBC  Chronic kidney disease -Trend BMP / urinary output -Replace electrolytes as indicated -Avoid nephrotoxic agents, ensure adequate renal perfusion  Elevated troponin Grade I Diastolic Dysfunction  Circumferential Pericardial Effusion Suspect demand ischemia, note wall motion irregularity on ECHO -per Cardiology, defer further work up to Dr. KDoylene Canard Best practice:  Diet: as tolerated Pain/Anxiety/Delirium protocol (if indicated): NA VAP protocol (if indicated): NA DVT prophylaxis: SCDs GI prophylaxis: PPI Glucose control: [place] Mobility: Bed Code Status: Full Family Communication: patient updated on plan of care 7/29 Disposition: SDU  Labs   CBC: Recent Labs  Lab 08/07/19 0224 08/07/19 1531 08/08/19 0613 08/08/19 2316  WBC 12.8* 12.0* 11.7* 13.7*  NEUTROABS 10.2*  --   --   --   HGB 4.5* 6.5* 7.9* 8.7*  HCT 15.0* 20.6* 24.2* 27.4*  MCV 97.4 94.1 92.7 93.2  PLT 115* 88* 58* 76*    Basic Metabolic Panel: Recent Labs  Lab 08/06/19 2055 08/07/19 1531 08/08/19 0613 08/09/19 0215  NA 136 135 134* 135  K 3.1* 2.8* 3.4* 3.4*  CL 107 106 109 109  CO2 17* 20* 18* 20*  GLUCOSE 149* 127* 100* 105*  BUN 23 27* 23 26*  CREATININE 1.69* 1.82* 1.23* 1.33*  CALCIUM 9.3 9.2 9.2 9.6   GFR: Estimated Creatinine  Clearance: 43.7 mL/min (A) (by C-G formula based on SCr of 1.33 mg/dL (H)). Recent Labs  Lab 08/07/19 0224 08/07/19 1531 08/08/19 0613 08/08/19 0650 08/08/19 2316 08/09/19 0215  PROCALCITON  --  <0.10 <0.10  --   --  <0.10  WBC 12.8* 12.0* 11.7*  --  13.7*  --   LATICACIDVEN  --  3.6*  --  1.4  --   --     Liver Function Tests: Recent Labs  Lab 08/06/19 2055 08/07/19 1531 08/08/19 0613  AST 56*  --  43*  ALT 34  --  30  ALKPHOS 180*  --  129*  BILITOT 6.1*  --  6.8*  PROT 5.1* 5.5* 4.9*  ALBUMIN 2.4*  --  2.5*   No results for input(s): LIPASE, AMYLASE in the last 168 hours. No results for input(s): AMMONIA in the last 168 hours.  ABG    Component Value Date/Time   HCO3 16.9 (L) 10/30/2018 1810   ACIDBASEDEF 7.0 (H) 10/30/2018 1810   O2SAT 59.8 10/30/2018 1810     Coagulation Profile: Recent Labs  Lab 08/07/19 0030 08/07/19 1539 08/07/19 2234 08/08/19 0613 08/09/19 0215  INR 2.7* 2.3* 2.1* 1.9* 2.1*    Cardiac Enzymes: No results for input(s): CKTOTAL, CKMB, CKMBINDEX, TROPONINI in the last 168 hours.  HbA1C: No results found for: HGBA1C  CBG: No results for input(s):  GLUCAP in the last 168 hours.      Critical care time: NA    Noe Gens, MSN, NP-C [facility] 08/09/2019, 10:06 AM   Please see [email or web address] for pager details.

## 2019-08-09 NOTE — Progress Notes (Signed)
Ref: Lawerance Cruel, MD   Subjective:  Awake. Afebrile.VS stable. No complaints by patient. She is aware of need to give up alcohol. 900 ml chest tube output.  Hgb 8.7 gm and Potassium is 3.4 mmol. Creatinine 1.33  Objective:  Vital Signs in the last 24 hours: Temp:  [98.1 F (36.7 C)-98.7 F (37.1 C)] 98.1 F (36.7 C) (07/29 0800) Pulse Rate:  [73-100] 81 (07/29 0600) Cardiac Rhythm: Normal sinus rhythm (07/29 0729) Resp:  [18-29] 20 (07/29 0600) BP: (105-140)/(45-66) 118/50 (07/29 0600) SpO2:  [88 %-99 %] 97 % (07/29 0600)  Physical Exam: BP Readings from Last 1 Encounters:  08/09/19 (!) 118/50     Wt Readings from Last 1 Encounters:  08/08/19 71.5 kg    Weight change:  Body mass index is 23.97 kg/m. HEENT: [place]/AT, Eyes-Blue, PERL, EOMI, Conjunctiva-Pink, Sclera-Icteric Neck: No JVD, No bruit, Trachea midline. Lungs:  Crackles left sided. Chest tube in place. Cardiac:  Regular rhythm, normal S1 and S2, no S3. II/VI systolic murmur. Abdomen:  Soft, non-tender. BS present. Extremities:  No edema present. No cyanosis. No clubbing. CNS: AxOx3, Cranial nerves grossly intact, moves all 4 extremities.  Skin: Warm and dry.   Intake/Output from previous day: 07/28 0701 - 07/29 0700 In: 4930.5 [P.O.:480; I.V.:3697.6; IV Piggyback:732.9] Out: 2100 [Urine:1200; Chest Tube:900]    Lab Results: BMET    Component Value Date/Time   NA 135 08/09/2019 0215   NA 134 (L) 08/08/2019 0613   NA 135 08/07/2019 1531   NA 139 02/15/2019 1012   NA 145 (H) 05/25/2017 1419   NA 142 05/09/2017 1533   NA 141 11/25/2014 1532   NA 141 05/13/2014 0847   K 3.4 (L) 08/09/2019 0215   K 3.4 (L) 08/08/2019 0613   K 2.8 (L) 08/07/2019 1531   K 2.7 (LL) 11/25/2014 1532   K 3.1 (L) 05/13/2014 0847   CL 109 08/09/2019 0215   CL 109 08/08/2019 0613   CL 106 08/07/2019 1531   CO2 20 (L) 08/09/2019 0215   CO2 18 (L) 08/08/2019 0613   CO2 20 (L) 08/07/2019 1531   CO2 22 11/25/2014 1532    CO2 20 (L) 05/13/2014 0847   GLUCOSE 105 (H) 08/09/2019 0215   GLUCOSE 100 (H) 08/08/2019 0613   GLUCOSE 127 (H) 08/07/2019 1531   GLUCOSE 113 11/25/2014 1532   GLUCOSE 105 05/13/2014 0847   BUN 26 (H) 08/09/2019 0215   BUN 23 08/08/2019 0613   BUN 27 (H) 08/07/2019 1531   BUN 11 02/15/2019 1012   BUN 15 05/25/2017 1419   BUN 14 05/09/2017 1533   BUN 16.4 11/25/2014 1532   BUN 16.2 05/13/2014 0847   CREATININE 1.33 (H) 08/09/2019 0215   CREATININE 1.23 (H) 08/08/2019 0613   CREATININE 1.82 (H) 08/07/2019 1531   CREATININE 1.36 (H) 07/12/2019 1122   CREATININE 1.29 (H) 07/05/2019 1507   CREATININE 1.30 (H) 04/16/2019 1427   CREATININE 1.4 (H) 11/25/2014 1532   CREATININE 1.2 (H) 05/13/2014 0847   CALCIUM 9.6 08/09/2019 0215   CALCIUM 9.2 08/08/2019 0613   CALCIUM 9.2 08/07/2019 1531   CALCIUM 10.4 11/25/2014 1532   CALCIUM 9.3 05/13/2014 0847   GFRNONAA 42 (L) 08/09/2019 0215   GFRNONAA 47 (L) 08/08/2019 0613   GFRNONAA 29 (L) 08/07/2019 1531   GFRNONAA 41 (L) 07/12/2019 1122   GFRNONAA 44 (L) 07/05/2019 1507   GFRNONAA 44 (L) 04/16/2019 1427   GFRAA 49 (L) 08/09/2019 0215   GFRAA 54 (L)  08/08/2019 0613   GFRAA 34 (L) 08/07/2019 1531   GFRAA 48 (L) 07/12/2019 1122   GFRAA 51 (L) 07/05/2019 1507   GFRAA 51 (L) 04/16/2019 1427   CBC    Component Value Date/Time   WBC 13.7 (H) 08/08/2019 2316   RBC 2.94 (L) 08/08/2019 2316   HGB 8.7 (L) 08/08/2019 2316   HGB 9.3 (L) 07/12/2019 1122   HGB 7.9 (L) 02/15/2019 1012   HGB 12.6 11/25/2014 1532   HCT 27.4 (L) 08/08/2019 2316   HCT 23.2 (L) 02/15/2019 1012   HCT 38.5 11/25/2014 1532   PLT 76 (L) 08/08/2019 2316   PLT 55 (L) 07/12/2019 1122   PLT 82 (LL) 02/15/2019 1012   MCV 93.2 08/08/2019 2316   MCV 84 02/15/2019 1012   MCV 87.9 11/25/2014 1532   MCH 29.6 08/08/2019 2316   MCHC 31.8 08/08/2019 2316   RDW 18.5 (H) 08/08/2019 2316   RDW 17.0 (H) 02/15/2019 1012   RDW 15.9 (H) 11/25/2014 1532   LYMPHSABS 1.3  08/07/2019 0224   LYMPHSABS 1.2 02/15/2019 1012   LYMPHSABS 1.2 11/25/2014 1532   MONOABS 1.2 (H) 08/07/2019 0224   MONOABS 0.4 11/25/2014 1532   EOSABS 0.0 08/07/2019 0224   EOSABS 0.2 02/15/2019 1012   BASOSABS 0.0 08/07/2019 0224   BASOSABS 0.1 02/15/2019 1012   BASOSABS 0.0 11/25/2014 1532   HEPATIC Function Panel Recent Labs    08/06/19 2055 08/07/19 1531 08/08/19 0613  PROT 5.1* 5.5* 4.9*   HEMOGLOBIN A1C No components found for: HGA1C,  MPG CARDIAC ENZYMES Lab Results  Component Value Date   CKTOTAL 51 12/08/2018   TROPONINI 0.03 (HH) 06/25/2018   BNP No results for input(s): PROBNP in the last 8760 hours. TSH Recent Labs    12/09/18 1518 02/15/19 1012  TSH 0.752 1.140   CHOLESTEROL Recent Labs    08/07/19 1531  CHOL 170    Scheduled Meds: . Chlorhexidine Gluconate Cloth  6 each Topical Daily  . folic acid  1 mg Oral Daily  . magnesium oxide  400 mg Oral Daily  . mouth rinse  15 mL Mouth Rinse BID  . multivitamin with minerals  1 tablet Oral Daily  . pantoprazole  40 mg Oral Daily  . phytonadione  10 mg Oral Daily  . potassium chloride  20 mEq Oral TID  . sodium chloride flush  10 mL Intracatheter Q8H   Continuous Infusions: . sodium chloride 20 mL/hr at 08/07/19 0730  . lactated ringers 100 mL/hr at 08/09/19 0630  . meropenem (MERREM) IV Stopped (08/08/19 2130)   PRN Meds:.acetaminophen, morphine injection, nitroGLYCERIN, ondansetron (ZOFRAN) IV, traMADol  Assessment/Plan: Acute coronary syndrome Large pleural effusion with hemothorax S/P left sided chest tube with drainage Left heart systolic failure, compensated Acute respiratory failure, improving Liver cirrhosis with jaundice and portal hypertension Anemia of blood loss Alcohol use disorder Abnormal PT/INR Pancytopenia Hypokalemia CKD, IIIa  Increase vitamin K dosage. Increase potassium supplement. Continue medical treatment. Add small dose spironolactone if  tolerated. Appreciate GI consult.   LOS: 3 days   Time spent including chart review, lab review, examination, discussion with patient : 30 min   Dixie Dials  MD  08/09/2019, 8:57 AM

## 2019-08-10 ENCOUNTER — Inpatient Hospital Stay (HOSPITAL_COMMUNITY): Payer: Medicare (Managed Care)

## 2019-08-10 DIAGNOSIS — D689 Coagulation defect, unspecified: Secondary | ICD-10-CM

## 2019-08-10 DIAGNOSIS — J942 Hemothorax: Secondary | ICD-10-CM

## 2019-08-10 LAB — COMPREHENSIVE METABOLIC PANEL
ALT: 27 U/L (ref 0–44)
AST: 32 U/L (ref 15–41)
Albumin: 2.1 g/dL — ABNORMAL LOW (ref 3.5–5.0)
Alkaline Phosphatase: 178 U/L — ABNORMAL HIGH (ref 38–126)
Anion gap: 4 — ABNORMAL LOW (ref 5–15)
BUN: 23 mg/dL (ref 8–23)
CO2: 19 mmol/L — ABNORMAL LOW (ref 22–32)
Calcium: 8.7 mg/dL — ABNORMAL LOW (ref 8.9–10.3)
Chloride: 108 mmol/L (ref 98–111)
Creatinine, Ser: 1.18 mg/dL — ABNORMAL HIGH (ref 0.44–1.00)
GFR calc Af Amer: 57 mL/min — ABNORMAL LOW (ref >60)
GFR calc non Af Amer: 49 mL/min — ABNORMAL LOW (ref >60)
Glucose, Bld: 108 mg/dL — ABNORMAL HIGH (ref 70–99)
Potassium: 3.6 mmol/L (ref 3.5–5.1)
Sodium: 131 mmol/L — ABNORMAL LOW (ref 135–145)
Total Bilirubin: 4.2 mg/dL — ABNORMAL HIGH (ref 0.3–1.2)
Total Protein: 4.6 g/dL — ABNORMAL LOW (ref 6.5–8.1)

## 2019-08-10 LAB — CBC
HCT: 29.7 % — ABNORMAL LOW (ref 36.0–46.0)
Hemoglobin: 9.3 g/dL — ABNORMAL LOW (ref 12.0–15.0)
MCH: 29.6 pg (ref 26.0–34.0)
MCHC: 31.3 g/dL (ref 30.0–36.0)
MCV: 94.6 fL (ref 80.0–100.0)
Platelets: 88 10*3/uL — ABNORMAL LOW (ref 150–400)
RBC: 3.14 MIL/uL — ABNORMAL LOW (ref 3.87–5.11)
RDW: 18.5 % — ABNORMAL HIGH (ref 11.5–15.5)
WBC: 13.7 10*3/uL — ABNORMAL HIGH (ref 4.0–10.5)
nRBC: 0 % (ref 0.0–0.2)

## 2019-08-10 LAB — CHOLESTEROL, BODY FLUID: Cholesterol, Fluid: 132 mg/dL

## 2019-08-10 LAB — PROTIME-INR
INR: 2.1 — ABNORMAL HIGH (ref 0.8–1.2)
Prothrombin Time: 22.9 seconds — ABNORMAL HIGH (ref 11.4–15.2)

## 2019-08-10 MED ORDER — KCL IN DEXTROSE-NACL 40-5-0.9 MEQ/L-%-% IV SOLN
INTRAVENOUS | Status: DC
  Filled 2019-08-10: qty 1000

## 2019-08-10 MED ORDER — MAGNESIUM HYDROXIDE 400 MG/5ML PO SUSP
30.0000 mL | Freq: Every day | ORAL | Status: DC | PRN
  Administered 2019-08-10: 30 mL via ORAL
  Filled 2019-08-10: qty 30

## 2019-08-10 MED ORDER — ALBUMIN HUMAN 25 % IV SOLN
12.5000 g | Freq: Once | INTRAVENOUS | Status: AC
  Administered 2019-08-10: 12.5 g via INTRAVENOUS
  Filled 2019-08-10: qty 50

## 2019-08-10 NOTE — Evaluation (Signed)
Occupational Therapy Evaluation Patient Details Name: Dana Watkins MRN: [id number] DOB: 07-Aug-1955 Today's Date: 08/10/2019    History of Present Illness 63 with HTN, CAD, CKD, migraines, mitral regurg, pancytopenia, cirrhosisRecently evaluated in the ED on 7/28 for bronchitis with left pleural effusion. Treated with azithromycin with no improvement.Came back to the ED with worsening dyspnea, chest discomfort. Chest x-ray with significantly increased large left effusion. hemoglobin of 4.5 with elevated INR.CT chest 08/06/2019- No PE, large effusion with left hemithorax opacification and collapse of left lung. Chest tube placed 7/26.   Clinical Impression   Patient presents with impaired balance and decreased safety awareness resulting in impaired ability to safely perform independent ADLs and mobility. Patient has one loss of balance standing from toilet, moved too quickly with lines and leans and laughed euphorically. Patient able to perform toileting and donning socks without difficulty. Patient will benefit from skilled OT services to improve independence and safety prior to discharge home.    Follow Up Recommendations  No OT follow up;Supervision/Assistance - 24 hour (would benefit from close supervision initially at discharge)    Equipment Recommendations  None recommended by OT    Recommendations for Other Services       Precautions / Restrictions Precautions Precautions: Fall Precaution Comments: left CT Restrictions Weight Bearing Restrictions: No      Mobility Bed Mobility Overal bed mobility: Needs Assistance Bed Mobility: Supine to Sit     Supine to sit: Min guard     General bed mobility comments: cues for lines/CT  Transfers Overall transfer level: Needs assistance Equipment used: 1 person hand held assist Transfers: Sit to/from Stand Sit to Stand: Min assist         General transfer comment: steady assistance to stand from bed and toilet, mild  balance loss.    Balance Overall balance assessment: Needs assistance Sitting-balance support: No upper extremity supported;Feet supported Sitting balance-Leahy Scale: Good   Postural control: Posterior lean Standing balance support: During functional activity;No upper extremity supported Standing balance-Leahy Scale: Fair Standing balance comment: requires support for dynamic balance                           ADL either performed or assessed with clinical judgement   ADL Overall ADL's : Needs assistance/impaired Eating/Feeding: Independent   Grooming: Independent   Upper Body Bathing: Min guard;Supervision/ safety   Lower Body Bathing: Supervison/ safety;Min guard Lower Body Bathing Details (indicate cue type and reason): donned socks at side of bed Upper Body Dressing : Supervision/safety;Min guard   Lower Body Dressing: Supervision/safety;Min guard   Toilet Transfer: Min guard;Supervision/safety;Grab bars Toilet Transfer Details (indicate cue type and reason): verbal cues to use grab bar Toileting- Clothing Manipulation and Hygiene: Min guard;Supervision/safety               Vision   Vision Assessment?: No apparent visual deficits     Perception     Praxis      Pertinent Vitals/Pain Pain Assessment: 0-10 Pain Score: 5  Pain Location: chest tube site Pain Descriptors / Indicators: Discomfort;Sharp Pain Intervention(s): Premedicated before session;Monitored during session;Limited activity within patient's tolerance     Hand Dominance Right   Extremity/Trunk Assessment Upper Extremity Assessment Upper Extremity Assessment: Overall WFL for tasks assessed;LUE deficits/detail LUE: Unable to fully assess due to pain (refers to left side near chest tube site with resistance)   Lower Extremity Assessment Lower Extremity Assessment: LLE deficits/detail   Cervical / Trunk Assessment  Cervical / Trunk Assessment: Normal   Communication  Communication Communication: No difficulties   Cognition Arousal/Alertness: Awake/alert Behavior During Therapy: WFL for tasks assessed/performed;Impulsive Overall Cognitive Status: Impaired/Different from baseline Area of Impairment: Safety/judgement;Awareness                         Safety/Judgement: Decreased awareness of safety Awareness: Anticipatory   General Comments: somewhat euphoric, laughing frequently   General Comments       Exercises     Shoulder Instructions      Home Living Family/patient expects to be discharged to:: Private residence Living Arrangements: Alone Available Help at Discharge: Family;Neighbor;Available PRN/intermittently Type of Home: Apartment Home Access: Level entry (few steps to get into parent's house)     Home Layout: One level     Bathroom Shower/Tub: Walk-in shower (walk in at parents house)   Biochemist, clinical: Reliance: None   Additional Comments: Patient reports she may discharge to her parent's house instead of her apartment. REports she can borrow equipment from family if she needed it.      Prior Functioning/Environment Level of Independence: Independent                 OT Problem List: Decreased activity tolerance;Impaired balance (sitting and/or standing);Decreased safety awareness;Pain      OT Treatment/Interventions: Self-care/ADL training;DME and/or AE instruction;Therapeutic activities;Balance training;Patient/family education    OT Goals(Current goals can be found in the care plan section) Acute Rehab OT Goals Patient Stated Goal: to get up and go OT Goal Formulation: With patient Time For Goal Achievement: 08/24/19 Potential to Achieve Goals: Good  OT Frequency: Min 2X/week   Barriers to D/C:            Co-evaluation PT/OT/SLP Co-Evaluation/Treatment: Yes Reason for Co-Treatment: For patient/therapist safety PT goals addressed during session: Mobility/safety with  mobility OT goals addressed during session: ADL's and self-care      AM-PAC OT "6 Clicks" Daily Activity     Outcome Measure Help from another person eating meals?: None Help from another person taking care of personal grooming?: None Help from another person toileting, which includes using toliet, bedpan, or urinal?: A Little Help from another person bathing (including washing, rinsing, drying)?: A Little Help from another person to put on and taking off regular upper body clothing?: A Little Help from another person to put on and taking off regular lower body clothing?: A Little 6 Click Score: 20   End of Session Nurse Communication: Mobility status (CT drainage color, need for chair alarm)  Activity Tolerance: Patient tolerated treatment well Patient left: in chair;with call bell/phone within reach;with chair alarm set  OT Visit Diagnosis: Unsteadiness on feet (R26.81);Pain                Time: 2620-3559 OT Time Calculation (min): 24 min Charges:  OT General Charges $OT Visit: 1 Visit OT Evaluation $OT Eval Low Complexity: 1 Low  Conley Delisle, OTR/L [place]  Office [phone] Pager: [place] 08/10/2019, 1:58 PM

## 2019-08-10 NOTE — Evaluation (Signed)
Physical Therapy Evaluation Patient Details Name: Dana Watkins MRN: [id number] DOB: 08/07/1955 Today's Date: 08/10/2019   History of Present Illness  63 with HTN, CAD, CKD, migraines, mitral regurg, pancytopenia, cirrhosisRecently evaluated in the ED on 7/28 for bronchitis with left pleural effusion. Treated with azithromycin with no improvement.Came back to the ED with worsening dyspnea, chest discomfort. Chest x-ray with significantly increased large left effusion. hemoglobin of 4.5 with elevated INR.CT chest 08/06/2019- No PE, large effusion with left hemithorax opacification and collapse of left lung. Chest tube placed 7/26.  Clinical Impression  The patient  Required assistance for balance to ambul;ate short distance in /room on RA, gait is unsteady. SPO2 on RA >92%. The patient somewhat euphoric, decreased safety judgement. Patient should progress to Dc  Home, may stay with parents per patient. Pt admitted with above diagnosis.  Pt currently with functional limitations due to the deficits listed below (see PT Problem List). Pt will benefit from skilled PT to increase their independence and safety with mobility to allow discharge to the venue listed below.   RN notified of increased bloody drainage in CT after mobilizing.    Follow Up Recommendations Home health PT    Equipment Recommendations   (tba)    Recommendations for Other Services       Precautions / Restrictions Precautions Precautions: Fall Precaution Comments: left CT Restrictions Weight Bearing Restrictions: No      Mobility  Bed Mobility Overal bed mobility: Needs Assistance Bed Mobility: Supine to Sit     Supine to sit: Min guard     General bed mobility comments: cues for lines/CT  Transfers Overall transfer level: Needs assistance Equipment used: 1 person hand held assist Transfers: Sit to/from Stand Sit to Stand: Min assist         General transfer comment: steady assistance to stand from bed  and toilet, mild balance loss.  Ambulation/Gait Ambulation/Gait assistance: Min assist;+2 safety/equipment Gait Distance (Feet): 20 Feet (x2) Assistive device: 1 person hand held assist Gait Pattern/deviations: Step-through pattern;Staggering left;Staggering right;Leaning posteriorly Gait velocity: decr   General Gait Details: required staedy assistance for balance while moving, stands with tendency to sway posteriorly  Stairs            Wheelchair Mobility    Modified Rankin (Stroke Patients Only)       Balance Overall balance assessment: Needs assistance Sitting-balance support: No upper extremity supported;Feet supported Sitting balance-Leahy Scale: Good   Postural control: Posterior lean Standing balance support: During functional activity;No upper extremity supported Standing balance-Leahy Scale: Poor Standing balance comment: requires support for dynamic balance                             Pertinent Vitals/Pain Pain Assessment: 0-10 Pain Score: 5  Pain Location: chest tube site Pain Descriptors / Indicators: Discomfort;Sharp Pain Intervention(s): Premedicated before session;Monitored during session;Limited activity within patient's tolerance    Home Living Family/patient expects to be discharged to:: Private residence Living Arrangements: Alone Available Help at Discharge: Family;Neighbor;Available PRN/intermittently Type of Home: Apartment Home Access: Level entry (few steps to get into parent's house)     Home Layout: One level Home Equipment: None Additional Comments: Patient reports she may discharge to her parent's house instead of her apartment. REports she can borrow equipment from family if she needed it.    Prior Function Level of Independence: Independent  Hand Dominance   Dominant Hand: Right    Extremity/Trunk Assessment        Lower Extremity Assessment Lower Extremity Assessment: Generalized  weakness    Cervical / Trunk Assessment Cervical / Trunk Assessment: Normal (guarding Left side)  Communication   Communication: No difficulties  Cognition Arousal/Alertness: Awake/alert Behavior During Therapy: WFL for tasks assessed/performed;Impulsive Overall Cognitive Status: Impaired/Different from baseline Area of Impairment: Safety/judgement;Awareness                         Safety/Judgement: Decreased awareness of safety Awareness: Anticipatory   General Comments: somewhat euphoric, laughing frequently      General Comments      Exercises     Assessment/Plan    PT Assessment Patient needs continued PT services  PT Problem List Decreased mobility;Decreased strength;Decreased safety awareness;Decreased knowledge of precautions;Decreased activity tolerance;Decreased balance;Decreased knowledge of use of DME       PT Treatment Interventions DME instruction;Therapeutic activities;Cognitive remediation;Gait training;Therapeutic exercise;Patient/family education;Functional mobility training;Stair training    PT Goals (Current goals can be found in the Care Plan section)  Acute Rehab PT Goals Patient Stated Goal: to get up and go PT Goal Formulation: With patient Time For Goal Achievement: 08/24/19 Potential to Achieve Goals: Good    Frequency Min 3X/week   Barriers to discharge        Co-evaluation PT/OT/SLP Co-Evaluation/Treatment: Yes Reason for Co-Treatment: For patient/therapist safety PT goals addressed during session: Mobility/safety with mobility OT goals addressed during session: ADL's and self-care       AM-PAC PT "6 Clicks" Mobility  Outcome Measure Help needed turning from your back to your side while in a flat bed without using bedrails?: A Little Help needed moving from lying on your back to sitting on the side of a flat bed without using bedrails?: A Little Help needed moving to and from a bed to a chair (including a wheelchair)?: A  Little Help needed standing up from a chair using your arms (e.g., wheelchair or bedside chair)?: A Little Help needed to walk in hospital room?: A Lot Help needed climbing 3-5 steps with a railing? : Total 6 Click Score: 15    End of Session   Activity Tolerance: Patient limited by fatigue;Patient tolerated treatment well Patient left: in chair;with call bell/phone within reach;with chair alarm set Nurse Communication: Mobility status PT Visit Diagnosis: Unsteadiness on feet (R26.81);Difficulty in walking, not elsewhere classified (R26.2)    Time: 2637-8588 PT Time Calculation (min) (ACUTE ONLY): 26 min   Charges:   PT Evaluation $PT Eval Moderate Complexity: [place] Pager [phone] Office [phone]   Claretha Cooper 08/10/2019, 1:50 PM

## 2019-08-10 NOTE — Progress Notes (Signed)
Ref: Lawerance Cruel, MD   Subjective:  Awake. T max 99.6 degree F. 400 cc chest tube output. Potassium 3.6, Creatinine is 1.18. Hypoalbuminemia continues.  Objective:  Vital Signs in the last 24 hours: Temp:  [98.1 F (36.7 C)-99.6 F (37.6 C)] 98.1 F (36.7 C) (07/30 0929) Pulse Rate:  [78-92] 86 (07/30 0800) Cardiac Rhythm: Normal sinus rhythm (07/30 0800) Resp:  [18-28] 24 (07/30 0800) BP: (114-155)/(42-63) 134/55 (07/30 0800) SpO2:  [92 %-100 %] 95 % (07/30 0800)  Physical Exam: BP Readings from Last 1 Encounters:  08/10/19 (!) 134/55     Wt Readings from Last 1 Encounters:  08/08/19 71.5 kg    Weight change:  Body mass index is 23.97 kg/m. HEENT: [place]/AT, Eyes-Blue, PERL, EOMI, Conjunctiva-Pink, Sclera-mildly-icteric Neck: No JVD, No bruit, Trachea midline. Lungs:  Crackles left sided. Chest tube in place. Cardiac:  Regular rhythm, normal S1 and S2, no S3. II/VI systolic murmur. Abdomen:  Soft, non-tender. BS present. Extremities:  No edema present. No cyanosis. No clubbing. CNS: AxOx3, Cranial nerves grossly intact, moves all 4 extremities.  Skin: Warm and dry. Icteric.   Intake/Output from previous day: 07/29 0701 - 07/30 0700 In: 3266 [P.O.:680; I.V.:2376; IV Piggyback:200] Out: 1350 [Urine:950; Chest Tube:400]    Lab Results: BMET    Component Value Date/Time   NA 131 (L) 08/10/2019 0227   NA 135 08/09/2019 0215   NA 134 (L) 08/08/2019 0613   NA 139 02/15/2019 1012   NA 145 (H) 05/25/2017 1419   NA 142 05/09/2017 1533   NA 141 11/25/2014 1532   NA 141 05/13/2014 0847   K 3.6 08/10/2019 0227   K 3.4 (L) 08/09/2019 0215   K 3.4 (L) 08/08/2019 0613   K 2.7 (LL) 11/25/2014 1532   K 3.1 (L) 05/13/2014 0847   CL 108 08/10/2019 0227   CL 109 08/09/2019 0215   CL 109 08/08/2019 0613   CO2 19 (L) 08/10/2019 0227   CO2 20 (L) 08/09/2019 0215   CO2 18 (L) 08/08/2019 0613   CO2 22 11/25/2014 1532   CO2 20 (L) 05/13/2014 0847   GLUCOSE 108 (H)  08/10/2019 0227   GLUCOSE 105 (H) 08/09/2019 0215   GLUCOSE 100 (H) 08/08/2019 0613   GLUCOSE 113 11/25/2014 1532   GLUCOSE 105 05/13/2014 0847   BUN 23 08/10/2019 0227   BUN 26 (H) 08/09/2019 0215   BUN 23 08/08/2019 0613   BUN 11 02/15/2019 1012   BUN 15 05/25/2017 1419   BUN 14 05/09/2017 1533   BUN 16.4 11/25/2014 1532   BUN 16.2 05/13/2014 0847   CREATININE 1.18 (H) 08/10/2019 0227   CREATININE 1.33 (H) 08/09/2019 0215   CREATININE 1.23 (H) 08/08/2019 0613   CREATININE 1.36 (H) 07/12/2019 1122   CREATININE 1.29 (H) 07/05/2019 1507   CREATININE 1.30 (H) 04/16/2019 1427   CREATININE 1.4 (H) 11/25/2014 1532   CREATININE 1.2 (H) 05/13/2014 0847   CALCIUM 8.7 (L) 08/10/2019 0227   CALCIUM 9.6 08/09/2019 0215   CALCIUM 9.2 08/08/2019 0613   CALCIUM 10.4 11/25/2014 1532   CALCIUM 9.3 05/13/2014 0847   GFRNONAA 49 (L) 08/10/2019 0227   GFRNONAA 42 (L) 08/09/2019 0215   GFRNONAA 47 (L) 08/08/2019 0613   GFRNONAA 41 (L) 07/12/2019 1122   GFRNONAA 44 (L) 07/05/2019 1507   GFRNONAA 44 (L) 04/16/2019 1427   GFRAA 57 (L) 08/10/2019 0227   GFRAA 49 (L) 08/09/2019 0215   GFRAA 54 (L) 08/08/2019 1610   GFRAA 48 (  L) 07/12/2019 1122   GFRAA 51 (L) 07/05/2019 1507   GFRAA 51 (L) 04/16/2019 1427   CBC    Component Value Date/Time   WBC 13.7 (H) 08/08/2019 2316   RBC 2.94 (L) 08/08/2019 2316   HGB 8.7 (L) 08/08/2019 2316   HGB 9.3 (L) 07/12/2019 1122   HGB 7.9 (L) 02/15/2019 1012   HGB 12.6 11/25/2014 1532   HCT 27.4 (L) 08/08/2019 2316   HCT 23.2 (L) 02/15/2019 1012   HCT 38.5 11/25/2014 1532   PLT 76 (L) 08/08/2019 2316   PLT 55 (L) 07/12/2019 1122   PLT 82 (LL) 02/15/2019 1012   MCV 93.2 08/08/2019 2316   MCV 84 02/15/2019 1012   MCV 87.9 11/25/2014 1532   MCH 29.6 08/08/2019 2316   MCHC 31.8 08/08/2019 2316   RDW 18.5 (H) 08/08/2019 2316   RDW 17.0 (H) 02/15/2019 1012   RDW 15.9 (H) 11/25/2014 1532   LYMPHSABS 1.3 08/07/2019 0224   LYMPHSABS 1.2 02/15/2019 1012    LYMPHSABS 1.2 11/25/2014 1532   MONOABS 1.2 (H) 08/07/2019 0224   MONOABS 0.4 11/25/2014 1532   EOSABS 0.0 08/07/2019 0224   EOSABS 0.2 02/15/2019 1012   BASOSABS 0.0 08/07/2019 0224   BASOSABS 0.1 02/15/2019 1012   BASOSABS 0.0 11/25/2014 1532   HEPATIC Function Panel Recent Labs    08/07/19 1531 08/08/19 0613 08/10/19 0227  PROT 5.5* 4.9* 4.6*   HEMOGLOBIN A1C No components found for: HGA1C,  MPG CARDIAC ENZYMES Lab Results  Component Value Date   CKTOTAL 51 12/08/2018   TROPONINI 0.03 (HH) 06/25/2018   BNP No results for input(s): PROBNP in the last 8760 hours. TSH Recent Labs    12/09/18 1518 02/15/19 1012  TSH 0.752 1.140   CHOLESTEROL Recent Labs    08/07/19 1531  CHOL 170    Scheduled Meds: . Chlorhexidine Gluconate Cloth  6 each Topical Daily  . folic acid  1 mg Oral Daily  . magnesium oxide  400 mg Oral Daily  . mouth rinse  15 mL Mouth Rinse BID  . multivitamin with minerals  1 tablet Oral Daily  . pantoprazole  40 mg Oral Daily  . phytonadione  10 mg Oral Daily  . potassium chloride  20 mEq Oral TID  . sodium chloride flush  10 mL Intracatheter Q8H  . spironolactone  12.5 mg Oral Daily   Continuous Infusions: . sodium chloride 20 mL/hr at 08/07/19 0730  . albumin human    . dextrose 5 % and 0.9 % NaCl with KCl 40 mEq/L    . meropenem (MERREM) IV Stopped (08/09/19 2230)   PRN Meds:.acetaminophen, morphine injection, nitroGLYCERIN, ondansetron (ZOFRAN) IV, traMADol  Assessment/Plan: Acute coronary syndrome Large left pleural effusion S/P left sided chest tube with drainage Liver cirrhosis with jaundice and portal hypertension Anemia of blood loss Alcohol use disorder Abnormal PT/INR Pancytopenia Hypokalemia, corrected CKD, II Severe hypoalbuminemia  Continue vitamin K and KCl supplement. Tolerating small dose spironolactone Continue chest tube drain. Increase activity as tolerated.   LOS: 4 days   Time spent including chart  review, lab review, examination, discussion with patient :  min   Dixie Dials  MD  08/10/2019, 9:31 AM

## 2019-08-10 NOTE — Progress Notes (Signed)
NAME:  Dana Watkins, MRN:  [id number], DOB:  04-14-1955, LOS: 4 ADMISSION DATE:  08/06/2019, CONSULTATION DATE: 08/07/19 REFERRING MD:  Jeanne Ivan MD, CHIEF COMPLAINT:  Dyspnea, pleural effusion  Brief History   63 with HTN, CAD, CKD, migraines, mitral regurg, pancytopenia, cirrhosis Recently evaluated in the ED on 7/28 for bronchitis with left pleural effusion. Treated with azithromycin with no improvement Came back to the ED with worsening dyspnea, chest discomfort. Chest x-ray with significantly increased large left effusion. Initially started on IV heparin for suspected ACS but subsequently stopped after she was found to have a hemoglobin of 4.5 with elevated INR. PCCM consulted  Past Medical History    has a past medical history of Acute hypokalemia (11/26/2014), Arthritis (05/15/2015), Benign essential hypertension (05/15/2015), C. difficile colitis, CAD (coronary artery disease), CKD (chronic kidney disease), stage III, Hypokalemia, Migraine headache (05/15/2015), Mild mitral regurgitation, Pancytopenia ([place]), Sepsis ([place]) (01/2017), Thrombocytopenia ([place]), and Vitamin D deficiency.  Significant Hospital Events   7/27 Admit 7/28 Evacuation of hemothorax on CXR, no transfer to [facility]   Consults:  PCCM  Procedures:  Left Chest Tube 7/27 >>   Significant Diagnostic Tests:  Chest x-ray 7/20 >> mild left base atelectasis with pleural effusion CT chest 08/06/2019- No PE, large effusion with left hemithorax opacification and collapse of left lung. Pleural Cytology 7/29 >>   Micro Data:  Pleural Fluid 7/27 >>   Antimicrobials:  Meropenem 7/27 >> 7/31 Vanco 7/27 >> 7/28  Interim history/subjective:  On room air  I/O 400 ml out of chest tube, 950 ml UOP, +1.9L  Pt reports discomfort from chest tube site   Objective   Blood pressure (!) 134/55, pulse 86, temperature 98.4 F (36.9 C), temperature source Axillary, resp. rate (!) 24, height 5' 8"  (1.727 m), weight 71.5 kg, SpO2 95 %.         Intake/Output Summary (Last 24 hours) at 08/10/2019 0902 Last data filed at 08/10/2019 0900 Gross per 24 hour  Intake 3420.07 ml  Output 1350 ml  Net 2070.07 ml   Filed Weights   08/07/19 1433 08/08/19 0459  Weight: 69 kg 71.5 kg    Examination: General: adult female lying in bed on phone in NAD  HEENT: MM pink/moist, fair dentition  Neuro: AAOx4, speech clear, MAE  CV: s1s2 rrr, no m/r/g PULM: non-labored on RA, lungs bilaterally clear anterior, slightly diminished left base GI: soft, bsx4 active  Extremities: warm/dry, no edema  Skin: thin, dry skin with multiple areas of ecchymosis    Resolved Hospital Problem list     Assessment & Plan:   Left pleural effusion, Hemothorax  Significantly increased since 7/20 when she was assessed in the ED suspected hemothorax given low hemoglobin and hx of ETOH abuse / high risk for falls. PCT negative, less likely infectious.  -left chest tube, to water seal  -follow pleural culture to maturity  -follow CXR while chest tube in place -empiric abx, D4/5. Plan to stop after 7/31 dosing  -anticipate we will be able to remove chest tube in am 7/31 based on drainage history  -CVTS reviewed case, no indication for surgical intervention at this time   Anemia EGD on 06/13/2019 with portal gastropathy and no varices.  Follows with Dr. Paulita Fujita.  -follow CBC -appreciate GI   Cirrhosis with portal hypertension EtOH abuse Appears decompensated with worsening bilirubin and INR -supportive care -monitor for withdrawal symptoms   Pancytopenia secondary to EtOH, cirrhosis Coagulopathy  Gets Aranesp as an outpatient from  Dr. Lindi Adie -follow CBC, coags  Chronic kidney disease -Trend BMP / urinary output -Replace electrolytes as indicated -Avoid nephrotoxic agents, ensure adequate renal perfusion  Elevated troponin Suspect demand ischemia, troponin cleared.  Wall motion irregularity on ECHO -defer further work up to Cardiology   Best  practice:  Diet: as tolerated Pain/Anxiety/Delirium protocol (if indicated): NA VAP protocol (if indicated): NA DVT prophylaxis: SCDs GI prophylaxis: PPI Glucose control: [place] Mobility: Bed Code Status: Full Family Communication: patient updated on plan of care 7/30 Disposition: SDU  Labs   CBC: Recent Labs  Lab 08/07/19 0224 08/07/19 1531 08/08/19 0613 08/08/19 2316  WBC 12.8* 12.0* 11.7* 13.7*  NEUTROABS 10.2*  --   --   --   HGB 4.5* 6.5* 7.9* 8.7*  HCT 15.0* 20.6* 24.2* 27.4*  MCV 97.4 94.1 92.7 93.2  PLT 115* 88* 58* 76*    Basic Metabolic Panel: Recent Labs  Lab 08/06/19 2055 08/07/19 1531 08/08/19 0613 08/09/19 0215 08/10/19 0227  NA 136 135 134* 135 131*  K 3.1* 2.8* 3.4* 3.4* 3.6  CL 107 106 109 109 108  CO2 17* 20* 18* 20* 19*  GLUCOSE 149* 127* 100* 105* 108*  BUN 23 27* 23 26* 23  CREATININE 1.69* 1.82* 1.23* 1.33* 1.18*  CALCIUM 9.3 9.2 9.2 9.6 8.7*   GFR: Estimated Creatinine Clearance: 49.2 mL/min (A) (by C-G formula based on SCr of 1.18 mg/dL (H)). Recent Labs  Lab 08/07/19 0224 08/07/19 1531 08/08/19 0613 08/08/19 0650 08/08/19 2316 08/09/19 0215  PROCALCITON  --  <0.10 <0.10  --   --  <0.10  WBC 12.8* 12.0* 11.7*  --  13.7*  --   LATICACIDVEN  --  3.6*  --  1.4  --   --     Liver Function Tests: Recent Labs  Lab 08/06/19 2055 08/07/19 1531 08/08/19 0613 08/10/19 0227  AST 56*  --  43* 32  ALT 34  --  30 27  ALKPHOS 180*  --  129* 178*  BILITOT 6.1*  --  6.8* 4.2*  PROT 5.1* 5.5* 4.9* 4.6*  ALBUMIN 2.4*  --  2.5* 2.1*   No results for input(s): LIPASE, AMYLASE in the last 168 hours. No results for input(s): AMMONIA in the last 168 hours.  ABG    Component Value Date/Time   HCO3 16.9 (L) 10/30/2018 1810   ACIDBASEDEF 7.0 (H) 10/30/2018 1810   O2SAT 59.8 10/30/2018 1810     Coagulation Profile: Recent Labs  Lab 08/07/19 1539 08/07/19 2234 08/08/19 0613 08/09/19 0215 08/10/19 0227  INR 2.3* 2.1* 1.9* 2.1*  2.1*    Cardiac Enzymes: No results for input(s): CKTOTAL, CKMB, CKMBINDEX, TROPONINI in the last 168 hours.  HbA1C: No results found for: HGBA1C  CBG: No results for input(s): GLUCAP in the last 168 hours.      Critical care time: NA    Noe Gens, MSN, NP-C [facility] 08/10/2019, 9:02 AM   Please see [email or web address] for pager details.

## 2019-08-11 ENCOUNTER — Inpatient Hospital Stay (HOSPITAL_COMMUNITY): Payer: Medicare (Managed Care)

## 2019-08-11 LAB — BPAM RBC
Blood Product Expiration Date: 202108242359
Blood Product Expiration Date: 202108242359
Blood Product Expiration Date: 202108272359
Blood Product Expiration Date: 202108272359
Blood Product Expiration Date: 202108272359
Blood Product Expiration Date: 202108272359
ISSUE DATE / TIME: 202107270438
ISSUE DATE / TIME: 202107270807
ISSUE DATE / TIME: 202107271946
ISSUE DATE / TIME: 202107280114
Unit Type and Rh: 5100
Unit Type and Rh: 5100
Unit Type and Rh: 5100
Unit Type and Rh: 5100
Unit Type and Rh: 5100
Unit Type and Rh: 5100

## 2019-08-11 LAB — BASIC METABOLIC PANEL
Anion gap: 7 (ref 5–15)
BUN: 20 mg/dL (ref 8–23)
CO2: 19 mmol/L — ABNORMAL LOW (ref 22–32)
Calcium: 9.2 mg/dL (ref 8.9–10.3)
Chloride: 107 mmol/L (ref 98–111)
Creatinine, Ser: 1.1 mg/dL — ABNORMAL HIGH (ref 0.44–1.00)
GFR calc Af Amer: >60 mL/min (ref >60)
GFR calc non Af Amer: 53 mL/min — ABNORMAL LOW (ref >60)
Glucose, Bld: 110 mg/dL — ABNORMAL HIGH (ref 70–99)
Potassium: 4.1 mmol/L (ref 3.5–5.1)
Sodium: 133 mmol/L — ABNORMAL LOW (ref 135–145)

## 2019-08-11 LAB — TYPE AND SCREEN
ABO/RH(D): O POS
Antibody Screen: NEGATIVE
Unit division: 0
Unit division: 0
Unit division: 0
Unit division: 0
Unit division: 0
Unit division: 0

## 2019-08-11 LAB — BODY FLUID CULTURE: Culture: NO GROWTH

## 2019-08-11 LAB — CBC
HCT: 26.3 % — ABNORMAL LOW (ref 36.0–46.0)
Hemoglobin: 8.3 g/dL — ABNORMAL LOW (ref 12.0–15.0)
MCH: 29.5 pg (ref 26.0–34.0)
MCHC: 31.6 g/dL (ref 30.0–36.0)
MCV: 93.6 fL (ref 80.0–100.0)
Platelets: 76 10*3/uL — ABNORMAL LOW (ref 150–400)
RBC: 2.81 MIL/uL — ABNORMAL LOW (ref 3.87–5.11)
RDW: 18 % — ABNORMAL HIGH (ref 11.5–15.5)
WBC: 10.7 10*3/uL — ABNORMAL HIGH (ref 4.0–10.5)
nRBC: 0.2 % (ref 0.0–0.2)

## 2019-08-11 LAB — PROTIME-INR
INR: 2.1 — ABNORMAL HIGH (ref 0.8–1.2)
Prothrombin Time: 22.5 seconds — ABNORMAL HIGH (ref 11.4–15.2)

## 2019-08-11 NOTE — Progress Notes (Signed)
NAME:  Dana Watkins, MRN:  [id number], DOB:  [birth year], LOS: 5 ADMISSION DATE:  08/06/2019, CONSULTATION DATE: 08/07/19 REFERRING MD:  Jeanne Ivan MD, CHIEF COMPLAINT:  Dyspnea, pleural effusion  Brief History   63 with HTN, CAD, CKD, migraines, mitral regurg, pancytopenia, cirrhosis Recently evaluated in the ED on 7/28 for bronchitis with left pleural effusion. Treated with azithromycin with no improvement Came back to the ED with worsening dyspnea, chest discomfort. Chest x-ray with significantly increased large left effusion. Initially started on IV heparin for suspected ACS but subsequently stopped after she was found to have a hemoglobin of 4.5 with elevated INR. PCCM consulted  Past Medical History    has a past medical history of Acute hypokalemia (11/26/2014), Arthritis (05/15/2015), Benign essential hypertension (05/15/2015), C. difficile colitis, CAD (coronary artery disease), CKD (chronic kidney disease), stage III, Hypokalemia, Migraine headache (05/15/2015), Mild mitral regurgitation, Pancytopenia ([place]), Sepsis ([place]) (01/2017), Thrombocytopenia ([place]), and Vitamin D deficiency.  Significant Hospital Events   7/27 Admit 7/28 Evacuation of hemothorax on CXR, no transfer to [facility]   Consults:  PCCM  Procedures:  Left Chest Tube 7/27 >>   Significant Diagnostic Tests:  Chest x-ray 7/20 >> mild left base atelectasis with pleural effusion CT chest 08/06/2019- No PE, large effusion with left hemithorax opacification and collapse of left lung. Pleural Cytology 7/29 >>   Micro Data:  Pleural Fluid 7/27 >> ng  Antimicrobials:  Meropenem 7/27 >> 7/31 Vanco 7/27 >> 7/28  Interim history/subjective:   Decrease pain chest tube site Afebrile No dyspnea Chest tube drained about 560 cc last 24 hours  Objective   Blood pressure (!) 119/50, pulse 87, temperature 98.7 F (37.1 C), temperature source Oral, resp. rate 22, height 5' 8"  (1.727 m), weight 81.1 kg, SpO2 92 %.         Intake/Output Summary (Last 24 hours) at 08/11/2019 1004 Last data filed at 08/11/2019 0425 Gross per 24 hour  Intake 1432.51 ml  Output 1360 ml  Net 72.51 ml   Filed Weights   08/07/19 1433 08/08/19 0459 08/11/19 0416  Weight: 69 kg 71.5 kg 81.1 kg    Examination: General: adult female lying in bed , no acute distress HEENT: MM pink/moist, fair dentition  Neuro: AAOx4, speech clear, MAE  CV: s1s2 rrr, no m/r/g PULM: No accessory muscle use, lungs bilaterally clear anterior, serosanguineous fluid draining from left chest tube, no airleak GI: soft, bsx4 active  Extremities: warm/dry, no edema  Skin: thin, dry skin with multiple areas of ecchymosis   Labs show mild hyponatremia, decrease leukocytosis, stable anemia, improving renal function  Resolved Hospital Problem list     Assessment & Plan:   Left pleural effusion, Hemothorax  Significantly increased since 7/20 when she was assessed in the ED suspected hemothorax given low hemoglobin and hx of ETOH abuse / high risk for falls.  -left chest tube, to water seal  -Discontinue antibiotics -Chest tube still draining 400 cc, would like drainage less than 150 cc before we discontinue -CVTS reviewed case, no indication for surgical intervention at this time   Anemia EGD on 06/13/2019 with portal gastropathy and no varices.  Follows with Dr. Paulita Fujita.  -No GI intervention  Cirrhosis with portal hypertension EtOH abuse Appears decompensated with worsening bilirubin and INR -monitor for withdrawal symptoms  -Resumed Aldactone  Pancytopenia secondary to EtOH, cirrhosis Coagulopathy  Gets Aranesp as an outpatient from Dr. Lindi Adie -follow CBC, coags   Elevated troponin Suspect demand ischemia, troponin cleared.  WMA  on ECHO -defer further work up to Cardiology     Labs   CBC: Recent Labs  Lab 08/07/19 0224 08/07/19 0224 08/07/19 1531 08/08/19 6122 08/08/19 2316 08/10/19 1437 08/11/19 0233  WBC 12.8*   < > 12.0*  11.7* 13.7* 13.7* 10.7*  NEUTROABS 10.2*  --   --   --   --   --   --   HGB 4.5*   < > 6.5* 7.9* 8.7* 9.3* 8.3*  HCT 15.0*   < > 20.6* 24.2* 27.4* 29.7* 26.3*  MCV 97.4   < > 94.1 92.7 93.2 94.6 93.6  PLT 115*   < > 88* 58* 76* 88* 76*   < > = values in this interval not displayed.    Basic Metabolic Panel: Recent Labs  Lab 08/07/19 1531 08/08/19 4497 08/09/19 0215 08/10/19 0227 08/11/19 0233  NA 135 134* 135 131* 133*  K 2.8* 3.4* 3.4* 3.6 4.1  CL 106 109 109 108 107  CO2 20* 18* 20* 19* 19*  GLUCOSE 127* 100* 105* 108* 110*  BUN 27* 23 26* 23 20  CREATININE 1.82* 1.23* 1.33* 1.18* 1.10*  CALCIUM 9.2 9.2 9.6 8.7* 9.2    Kara Mead MD. FCCP. [facility] [place] [facility]  If no response to pager , please call [phone]   08/11/2019

## 2019-08-11 NOTE — Progress Notes (Signed)
Subjective:  Patient denies any chest pain or shortness of breath.  Blood arranged chest tube drainage noted with also hemoglobin dropped from 9.3-8.3 today.  States overall feels okay.  Objective:  Vital Signs in the last 24 hours: Temp:  [98.4 F (36.9 C)-98.8 F (37.1 C)] 98.7 F (37.1 C) (07/31 0745) Pulse Rate:  [87-96] 87 (07/31 0800) Resp:  [20-30] 22 (07/31 0800) BP: (118-144)/(45-70) 119/50 (07/31 0800) SpO2:  [89 %-95 %] 92 % (07/31 0800) Weight:  [81.1 kg] 81.1 kg (07/31 0416)  Intake/Output from previous day: 07/30 0701 - 07/31 0700 In: 1586.6 [I.V.:1324.6; IV Piggyback:232] Out: 1360 [Urine:800; Chest Tube:560] Intake/Output from this shift: No intake/output data recorded.  Physical Exam: Neck: no adenopathy, no carotid bruit, no JVD and supple, symmetrical, trachea midline Lungs: Decreased breath sound at bases with occasional rhonchi Heart: regular rate and rhythm, S1, S2 normal and Soft systolic murmur noted no pericardial rub Abdomen: soft, non-tender; bowel sounds normal; no masses,  no organomegaly Extremities: extremities normal, atraumatic, no cyanosis or edema  Lab Results: Recent Labs    08/10/19 1437 08/11/19 0233  WBC 13.7* 10.7*  HGB 9.3* 8.3*  PLT 88* 76*   Recent Labs    08/10/19 0227 08/11/19 0233  NA 131* 133*  K 3.6 4.1  CL 108 107  CO2 19* 19*  GLUCOSE 108* 110*  BUN 23 20  CREATININE 1.18* 1.10*   No results for input(s): TROPONINI in the last 72 hours.  Invalid input(s): CK, MB Hepatic Function Panel Recent Labs    08/10/19 0227  PROT 4.6*  ALBUMIN 2.1*  AST 32  ALT 27  ALKPHOS 178*  BILITOT 4.2*   No results for input(s): CHOL in the last 72 hours. No results for input(s): PROTIME in the last 72 hours.  Imaging: Imaging results have been reviewed and DG CHEST PORT 1 VIEW  Result Date: 08/10/2019 CLINICAL DATA:  Pleural effusion. EXAM: PORTABLE CHEST 1 VIEW COMPARISON:  CT 08/08/2019.  Chest x-ray 08/08/2019,  08/07/2019. FINDINGS: Left chest tube in stable position. No pneumothorax. Cardiomegaly. Progressive bilateral pulmonary infiltrates/edema, most prominent right upper lung. Small left pleural effusion unchanged. Small right pleural effusion noted on today's exam. CHF could present this fashion. Bilateral pneumonia cannot be excluded. IMPRESSION: 1.  Left chest tube in stable position.  No pneumothorax. 2.  Cardiomegaly. 3. Progressive bilateral pulmonary infiltrates/edema, most prominent right upper lung. Small left pleural effusion unchanged. Small right pleural effusion noted on today's exam. CHF could present in this fashion. Bilateral pneumonia cannot be excluded. Electronically Signed   By: Marcello Moores  Register   On: 08/10/2019 05:49    Cardiac Studies:  Assessment/Plan:  Left pleural effusion/pneumothorax status post chest tube placement Cirrhosis of liver with portal hypertension and coagulopathy Pancytopenia Hypoalbuminemia Anemia of chronic disease Nonobstructive CAD in the past Status post mildly elevated high-sensitivity troponin I secondary to demand ischemia doubt significant MI Chronic kidney disease stage II History of EtOH abuse Plan Continue present management per CCM Check labs in a.m.   LOS: 5 days    Charolette Forward 08/11/2019, 10:05 AM

## 2019-08-12 ENCOUNTER — Inpatient Hospital Stay (HOSPITAL_COMMUNITY): Payer: Medicare (Managed Care)

## 2019-08-12 LAB — GLUCOSE, CAPILLARY: Glucose-Capillary: 97 mg/dL (ref 70–99)

## 2019-08-12 LAB — PROTIME-INR
INR: 2.1 — ABNORMAL HIGH (ref 0.8–1.2)
Prothrombin Time: 22.7 seconds — ABNORMAL HIGH (ref 11.4–15.2)

## 2019-08-12 NOTE — Progress Notes (Signed)
NAME:  Dana Watkins, MRN:  [id number], DOB:  [birth year], LOS: 6 ADMISSION DATE:  08/06/2019, CONSULTATION DATE: 08/07/19 REFERRING MD:  Jeanne Ivan MD, CHIEF COMPLAINT:  Dyspnea, pleural effusion  Brief History   63 with HTN, CAD, CKD, migraines, mitral regurg, pancytopenia, cirrhosis Recently evaluated in the ED on 7/28 for bronchitis with left pleural effusion. Treated with azithromycin with no improvement Came back to the ED with worsening dyspnea, chest discomfort. Chest x-ray with significantly increased large left effusion. Initially started on IV heparin for suspected ACS but subsequently stopped after she was found to have a hemoglobin of 4.5 with elevated INR. PCCM consulted  Past Medical History    has a past medical history of Acute hypokalemia (11/26/2014), Arthritis (05/15/2015), Benign essential hypertension (05/15/2015), C. difficile colitis, CAD (coronary artery disease), CKD (chronic kidney disease), stage III, Hypokalemia, Migraine headache (05/15/2015), Mild mitral regurgitation, Pancytopenia ([place]), Sepsis ([place]) (01/2017), Thrombocytopenia ([place]), and Vitamin D deficiency.  Significant Hospital Events   7/27 Admit 7/28 Evacuation of hemothorax on CXR, no transfer to [facility]   Consults:  PCCM  Procedures:  Left Chest Tube 7/27 >>   Significant Diagnostic Tests:  Chest x-ray 7/20 >> mild left base atelectasis with pleural effusion CT chest 08/06/2019- No PE, large effusion with left hemithorax opacification and collapse of left lung. Pleural Cytology 7/29 >> atypical cells, favor reactive mesothelial cells  Micro Data:  Pleural Fluid 7/27 >> ng  Antimicrobials:  Meropenem 7/27 >> 7/31 Vanco 7/27 >> 7/28  Interim history/subjective:   Chest tube drainage continues to decrease, but for 20 cc last 24 hours, 190 cc last 12 hours. Color has changed from serosanguineous to yellow No pain or dyspnea  Objective   Blood pressure (!) 130/82, pulse 88, temperature 97.9 F  (36.6 C), temperature source Oral, resp. rate 17, height 5' 8"  (1.727 m), weight 80.1 kg, SpO2 95 %.        Intake/Output Summary (Last 24 hours) at 08/12/2019 0851 Last data filed at 08/12/2019 0500 Gross per 24 hour  Intake 300 ml  Output 1270 ml  Net -970 ml   Filed Weights   08/08/19 0459 08/11/19 0416 08/12/19 0353  Weight: 71.5 kg 81.1 kg 80.1 kg    Examination: General: adult female lying in bed , no acute distress HEENT: MM pink/moist, fair dentition  Neuro: AAOx4, speech clear, MAE  CV: s1s2 rrr, no m/r/g PULM: No accessory muscle use, lungs bilaterally clear anterior, minimal amber-yellow fluid draining from left chest tube, no airleak GI: soft, bsx4 active  Extremities: warm/dry, no edema  Skin: thin, dry skin with multiple areas of ecchymosis   Labs show improving hyponatremia, resolved leukocytosis, stable anemia, improving renal function  Resolved Hospital Problem list     Assessment & Plan:   Left pleural effusion, Hemothorax  Sudden accumulation since 7/20 when she was assessed in the ED suspected hemothorax given low hemoglobin and hx of ETOH abuse / high risk for falls.  -Drainage has decreased and fluid has changed to amber-yellow consistent with transudate of fluid, can discontinue chest tube , repeat chest x-ray in a.m. -CVTS reviewed case, no indication for surgical intervention at this time   Anemia EGD on 06/13/2019 with portal gastropathy and no varices.  Follows with Dr. Paulita Fujita.  -No GI intervention  Cirrhosis with portal hypertension EtOH abuse Appears decompensated with worsening bilirubin and INR -monitor for withdrawal symptoms  -Resumed Aldactone  Pancytopenia secondary to EtOH, cirrhosis Coagulopathy  Gets Aranesp as an outpatient  from Dr. Lindi Adie -follow CBC, INR stable at 2.1   Elevated troponin Suspect demand ischemia, troponin cleared.  WMA on ECHO -defer further work up to Cardiology   Kara Mead MD. [facility] [place] [facility]  If no response to pager , please call [phone]   08/12/2019

## 2019-08-12 NOTE — Progress Notes (Signed)
Subjective:  Patient denies any chest pain or shortness of breath.  Chest tube drainage has markedly improved and is nonhemorrhagic today.  Chest tube planned to be removed today.  Objective:  Vital Signs in the last 24 hours: Temp:  [97.9 F (36.6 C)-98.9 F (37.2 C)] 97.9 F (36.6 C) (08/01 0700) Pulse Rate:  [84-103] 88 (08/01 0800) Resp:  [17-33] 17 (08/01 0800) BP: (114-183)/(38-117) 130/82 (08/01 0800) SpO2:  [90 %-95 %] 95 % (08/01 0800) Weight:  [80.1 kg] 80.1 kg (08/01 0353)  Intake/Output from previous day: 07/31 0701 - 08/01 0700 In: 300 [IV Piggyback:290] Out: 1270 [Urine:850; Chest Tube:420] Intake/Output from this shift: No intake/output data recorded.  Physical Exam: Neck: no adenopathy, no carotid bruit, no JVD and supple, symmetrical, trachea midline Lungs: Decreased breath sound at bases with occasional rhonchi Heart: regular rate and rhythm, S1, S2 normal and Soft systolic murmur noted Abdomen: soft, non-tender; bowel sounds normal; no masses,  no organomegaly Extremities: extremities normal, atraumatic, no cyanosis or edema  Lab Results: Recent Labs    08/10/19 1437 08/11/19 0233  WBC 13.7* 10.7*  HGB 9.3* 8.3*  PLT 88* 76*   Recent Labs    08/10/19 0227 08/11/19 0233  NA 131* 133*  K 3.6 4.1  CL 108 107  CO2 19* 19*  GLUCOSE 108* 110*  BUN 23 20  CREATININE 1.18* 1.10*   No results for input(s): TROPONINI in the last 72 hours.  Invalid input(s): CK, MB Hepatic Function Panel Recent Labs    08/10/19 0227  PROT 4.6*  ALBUMIN 2.1*  AST 32  ALT 27  ALKPHOS 178*  BILITOT 4.2*   No results for input(s): CHOL in the last 72 hours. No results for input(s): PROTIME in the last 72 hours.  Imaging: Imaging results have been reviewed and DG Chest Port 1 View  Result Date: 08/12/2019 CLINICAL DATA:  Acute respiratory failure. EXAM: PORTABLE CHEST 1 VIEW COMPARISON:  08/11/2019 and prior studies. FINDINGS: Hazy airspace lung opacity, in the  right mid to upper lung, appears slightly improved from the previous day's exam. Lung base opacity, greatest on the right, is similar to the previous day's exam. No new lung abnormalities. No pneumothorax. Pigtail catheter projecting over the left hemidiaphragm is stable. IMPRESSION: 1. Slight improvement in hazy airspace opacity in the right mid to upper lung. 2. No other change from the previous day's exam. Persistent lung base opacity most evident on the right. Electronically Signed   By: Lajean Manes M.D.   On: 08/12/2019 08:53   DG CHEST PORT 1 VIEW  Result Date: 08/11/2019 CLINICAL DATA:  Pleural effusion.  Follow-up study. EXAM: PORTABLE CHEST 1 VIEW COMPARISON:  08/10/2019 and older exams.  CT dated 07/09/2019. FINDINGS: Cardiac silhouette is mildly enlarged. Interstitial hazy airspace opacity in the right upper lung is stable. There is more confluent opacity at the right lung base obscuring hemidiaphragm. Mild opacity is noted at the left lung base. Pigtail chest tube ir catheter projects over the left hemidiaphragm, stable. No pneumothorax. IMPRESSION: 1. No significant change from the previous day's exam. 2. Interstitial and hazy airspace opacity in the right upper lobe consistent pneumonia. 3. More confluent lung base opacity, greater on the right, which may reflect pneumonia or atelectasis, associated with small pleural effusions as noted on the recent prior chest CT. Electronically Signed   By: Lajean Manes M.D.   On: 08/11/2019 10:14    Cardiac Studies:  Assessment/Plan:  Left pleural effusion/pneumothorax status post chest  tube placement Cirrhosis of liver with portal hypertension and coagulopathy Pancytopenia Hypoalbuminemia Anemia of chronic disease Nonobstructive CAD in the past Status post mildly elevated high-sensitivity troponin I secondary to demand ischemia doubt significant MI Chronic kidney disease stage II History of EtOH abuse Plan Continue present management Check  labs and chest x-ray in a.m.  LOS: 6 days    Charolette Forward 08/12/2019, 9:53 AM

## 2019-08-12 NOTE — Progress Notes (Signed)
MD Elsworth Soho made aware of serous output from removed chest tube site two hours after removal. Bedding changed, occlusive dressing changed. No change in oxygen saturation, breath sounds consistent, no pain or discomfort. Will continue to monitor.

## 2019-08-13 ENCOUNTER — Inpatient Hospital Stay (HOSPITAL_COMMUNITY): Payer: Medicare (Managed Care)

## 2019-08-13 ENCOUNTER — Telehealth: Payer: Self-pay | Admitting: Pulmonary Disease

## 2019-08-13 DIAGNOSIS — J942 Hemothorax: Secondary | ICD-10-CM

## 2019-08-13 LAB — BASIC METABOLIC PANEL
Anion gap: 3 — ABNORMAL LOW (ref 5–15)
BUN: 21 mg/dL (ref 8–23)
CO2: 22 mmol/L (ref 22–32)
Calcium: 9.1 mg/dL (ref 8.9–10.3)
Chloride: 110 mmol/L (ref 98–111)
Creatinine, Ser: 1.26 mg/dL — ABNORMAL HIGH (ref 0.44–1.00)
GFR calc Af Amer: 53 mL/min — ABNORMAL LOW (ref >60)
GFR calc non Af Amer: 45 mL/min — ABNORMAL LOW (ref >60)
Glucose, Bld: 100 mg/dL — ABNORMAL HIGH (ref 70–99)
Potassium: 4.5 mmol/L (ref 3.5–5.1)
Sodium: 135 mmol/L (ref 135–145)

## 2019-08-13 LAB — CBC WITH DIFFERENTIAL/PLATELET
Abs Immature Granulocytes: 0.04 10*3/uL (ref 0.00–0.07)
Basophils Absolute: 0.1 10*3/uL (ref 0.0–0.1)
Basophils Relative: 1 %
Eosinophils Absolute: 0.6 10*3/uL — ABNORMAL HIGH (ref 0.0–0.5)
Eosinophils Relative: 7 %
HCT: 27.9 % — ABNORMAL LOW (ref 36.0–46.0)
Hemoglobin: 8.6 g/dL — ABNORMAL LOW (ref 12.0–15.0)
Immature Granulocytes: 1 %
Lymphocytes Relative: 14 %
Lymphs Abs: 1.2 10*3/uL (ref 0.7–4.0)
MCH: 28.8 pg (ref 26.0–34.0)
MCHC: 30.8 g/dL (ref 30.0–36.0)
MCV: 93.3 fL (ref 80.0–100.0)
Monocytes Absolute: 1.2 10*3/uL — ABNORMAL HIGH (ref 0.1–1.0)
Monocytes Relative: 14 %
Neutro Abs: 5.5 10*3/uL (ref 1.7–7.7)
Neutrophils Relative %: 63 %
Platelets: 89 10*3/uL — ABNORMAL LOW (ref 150–400)
RBC: 2.99 MIL/uL — ABNORMAL LOW (ref 3.87–5.11)
RDW: 17.8 % — ABNORMAL HIGH (ref 11.5–15.5)
WBC: 8.6 10*3/uL (ref 4.0–10.5)
nRBC: 0 % (ref 0.0–0.2)

## 2019-08-13 LAB — PROTIME-INR
INR: 2 — ABNORMAL HIGH (ref 0.8–1.2)
Prothrombin Time: 22.1 seconds — ABNORMAL HIGH (ref 11.4–15.2)

## 2019-08-13 LAB — CYTOLOGY - NON PAP

## 2019-08-13 MED ORDER — PHYTONADIONE 5 MG PO TABS
10.0000 mg | ORAL_TABLET | Freq: Every day | ORAL | Status: DC
  Administered 2019-08-13 – 2019-08-14 (×2): 10 mg via ORAL
  Filled 2019-08-13 (×2): qty 2

## 2019-08-13 NOTE — Progress Notes (Signed)
LB PCCM  F/o from CXR earlier today: normal, no infiltrate  PCCM available PRN  Roselie Awkward, MD [place] Pager: [phone] Cell: [phone] If no response, call [phone]

## 2019-08-13 NOTE — Progress Notes (Signed)
Report called to receiving Rn [id number]. Patient with no complaints at the current time. Will transfer up via [place].

## 2019-08-13 NOTE — Telephone Encounter (Signed)
Staff message sent by Dr. Lake Bells which is posted below: Dana Doom, MD  P [facility] Triage Pool Hi,   Please arrange outpatient pulmonary follow up with either Dr. Carlis Abbott or Elsworth Soho for hemothorax. OK to see an NP, prefer for her to be seen in 1-2 weeks with CXR for hemothorax.   Thanks,  Ruby Cola    Pt has been scheduled a HFU with Derl Barrow 08/23/19 at 9:30 and order has been placed for pt to have cxr prior. I have also stated in the appt notes for pt to have the cxr prior to her seeing Beth.  Pt's appt info will show up on AVS once she is discharged from the hospital. Nothing further needed.

## 2019-08-13 NOTE — Progress Notes (Signed)
Ref: Lawerance Cruel, MD   Subjective:  Awake. No chest pain. Potassium is 4.5.  Chest tube is out. Right sided plural effusion persist. Left side pleural effusion resolved.   Objective:  Vital Signs in the last 24 hours: Temp:  [97.9 F (36.6 C)-99.7 F (37.6 C)] 98.2 F (36.8 C) (08/02 0400) Pulse Rate:  [77-97] 78 (08/02 0800) Cardiac Rhythm: Normal sinus rhythm (08/02 0701) Resp:  [17-26] 22 (08/02 0800) BP: (115-136)/(53-74) 120/73 (08/02 0800) SpO2:  [94 %-100 %] 97 % (08/02 0800) Weight:  [80.1 kg] 80.1 kg (08/02 0500)  Physical Exam: BP Readings from Last 1 Encounters:  08/13/19 120/73     Wt Readings from Last 1 Encounters:  08/13/19 80.1 kg    Weight change: 0 kg Body mass index is 26.85 kg/m. HEENT: [place]/AT, Eyes-Blue, PERL, EOMI, Conjunctiva-Pale, Sclera-mildly-icteric Neck: No JVD, No bruit, Trachea midline. Lungs:  Crackles on left side with dressing. Cardiac:  Regular rhythm, normal S1 and S2, no S3. II/VI systolic murmur. Abdomen:  Soft, non-tender. BS present. Extremities:  No edema present. No cyanosis. No clubbing. CNS: AxOx3, Cranial nerves grossly intact, moves all 4 extremities.  Skin: Warm and dry. Mildly icteric.   Intake/Output from previous day: 08/01 0701 - 08/02 0700 In: 740 [P.O.:720; I.V.:20] Out: 1101 [Urine:1100; Stool:1]    Lab Results: BMET    Component Value Date/Time   NA 135 08/13/2019 0750   NA 133 (L) 08/11/2019 0233   NA 131 (L) 08/10/2019 0227   NA 139 02/15/2019 1012   NA 145 (H) 05/25/2017 1419   NA 142 05/09/2017 1533   NA 141 11/25/2014 1532   NA 141 05/13/2014 0847   K 4.5 08/13/2019 0750   K 4.1 08/11/2019 0233   K 3.6 08/10/2019 0227   K 2.7 (LL) 11/25/2014 1532   K 3.1 (L) 05/13/2014 0847   CL 110 08/13/2019 0750   CL 107 08/11/2019 0233   CL 108 08/10/2019 0227   CO2 22 08/13/2019 0750   CO2 19 (L) 08/11/2019 0233   CO2 19 (L) 08/10/2019 0227   CO2 22 11/25/2014 1532   CO2 20 (L) 05/13/2014 0847    GLUCOSE 100 (H) 08/13/2019 0750   GLUCOSE 110 (H) 08/11/2019 0233   GLUCOSE 108 (H) 08/10/2019 0227   GLUCOSE 113 11/25/2014 1532   GLUCOSE 105 05/13/2014 0847   BUN 21 08/13/2019 0750   BUN 20 08/11/2019 0233   BUN 23 08/10/2019 0227   BUN 11 02/15/2019 1012   BUN 15 05/25/2017 1419   BUN 14 05/09/2017 1533   BUN 16.4 11/25/2014 1532   BUN 16.2 05/13/2014 0847   CREATININE 1.26 (H) 08/13/2019 0750   CREATININE 1.10 (H) 08/11/2019 0233   CREATININE 1.18 (H) 08/10/2019 0227   CREATININE 1.36 (H) 07/12/2019 1122   CREATININE 1.29 (H) 07/05/2019 1507   CREATININE 1.30 (H) 04/16/2019 1427   CREATININE 1.4 (H) 11/25/2014 1532   CREATININE 1.2 (H) 05/13/2014 0847   CALCIUM 9.1 08/13/2019 0750   CALCIUM 9.2 08/11/2019 0233   CALCIUM 8.7 (L) 08/10/2019 0227   CALCIUM 10.4 11/25/2014 1532   CALCIUM 9.3 05/13/2014 0847   GFRNONAA 45 (L) 08/13/2019 0750   GFRNONAA 53 (L) 08/11/2019 0233   GFRNONAA 49 (L) 08/10/2019 0227   GFRNONAA 41 (L) 07/12/2019 1122   GFRNONAA 44 (L) 07/05/2019 1507   GFRNONAA 44 (L) 04/16/2019 1427   GFRAA 53 (L) 08/13/2019 0750   GFRAA >60 08/11/2019 0233   GFRAA 57 (L) 08/10/2019  0227   GFRAA 48 (L) 07/12/2019 1122   GFRAA 51 (L) 07/05/2019 1507   GFRAA 51 (L) 04/16/2019 1427   CBC    Component Value Date/Time   WBC 8.6 08/13/2019 0750   RBC 2.99 (L) 08/13/2019 0750   HGB 8.6 (L) 08/13/2019 0750   HGB 9.3 (L) 07/12/2019 1122   HGB 7.9 (L) 02/15/2019 1012   HGB 12.6 11/25/2014 1532   HCT 27.9 (L) 08/13/2019 0750   HCT 23.2 (L) 02/15/2019 1012   HCT 38.5 11/25/2014 1532   PLT 89 (L) 08/13/2019 0750   PLT 55 (L) 07/12/2019 1122   PLT 82 (LL) 02/15/2019 1012   MCV 93.3 08/13/2019 0750   MCV 84 02/15/2019 1012   MCV 87.9 11/25/2014 1532   MCH 28.8 08/13/2019 0750   MCHC 30.8 08/13/2019 0750   RDW 17.8 (H) 08/13/2019 0750   RDW 17.0 (H) 02/15/2019 1012   RDW 15.9 (H) 11/25/2014 1532   LYMPHSABS 1.2 08/13/2019 0750   LYMPHSABS 1.2 02/15/2019 1012    LYMPHSABS 1.2 11/25/2014 1532   MONOABS 1.2 (H) 08/13/2019 0750   MONOABS 0.4 11/25/2014 1532   EOSABS 0.6 (H) 08/13/2019 0750   EOSABS 0.2 02/15/2019 1012   BASOSABS 0.1 08/13/2019 0750   BASOSABS 0.1 02/15/2019 1012   BASOSABS 0.0 11/25/2014 1532   HEPATIC Function Panel Recent Labs    08/07/19 1531 08/08/19 0613 08/10/19 0227  PROT 5.5* 4.9* 4.6*   HEMOGLOBIN A1C No components found for: HGA1C,  MPG CARDIAC ENZYMES Lab Results  Component Value Date   CKTOTAL 51 12/08/2018   TROPONINI 0.03 (HH) 06/25/2018   BNP No results for input(s): PROBNP in the last 8760 hours. TSH Recent Labs    12/09/18 1518 02/15/19 1012  TSH 0.752 1.140   CHOLESTEROL Recent Labs    08/07/19 1531  CHOL 170    Scheduled Meds: . Chlorhexidine Gluconate Cloth  6 each Topical Daily  . folic acid  1 mg Oral Daily  . magnesium oxide  400 mg Oral Daily  . mouth rinse  15 mL Mouth Rinse BID  . multivitamin with minerals  1 tablet Oral Daily  . pantoprazole  40 mg Oral Daily  . phytonadione  10 mg Oral Daily  . potassium chloride  20 mEq Oral TID  . spironolactone  12.5 mg Oral Daily   Continuous Infusions: . sodium chloride Stopped (08/11/19 0046)   PRN Meds:.acetaminophen, magnesium hydroxide, morphine injection, nitroGLYCERIN, ondansetron (ZOFRAN) IV  Assessment/Plan: S/P Left hemothorax, resolved Mild right pleural effusion Advanced Liver cirrhosis with jaundice Portal hypertension Anemia of blood loss Alcohol use disorder. Abnormal PT/INR Pancytopenia Hypokalemia, corrected CKD, II, improving Severe hypoalbuminemia  Increase activity. Appreciate CCM consults and treatments.    LOS: 7 days   Time spent including chart review, lab review, examination, discussion with patient : 30 min   Dixie Dials  MD  08/13/2019, 9:50 AM

## 2019-08-13 NOTE — Progress Notes (Signed)
Physical Therapy Treatment Patient Details Name: Dana Watkins MRN: [id number] DOB: 1955-06-13 Today's Date: 08/13/2019    History of Present Illness 63 with HTN, CAD, CKD, migraines, mitral regurg, pancytopenia, cirrhosisRecently evaluated in the ED on 7/28 for bronchitis with left pleural effusion. Treated with azithromycin with no improvement.Came back to the ED with worsening dyspnea, chest discomfort. Chest x-ray with significantly increased large left effusion. hemoglobin of 4.5 with elevated INR.CT chest 08/06/2019- No PE, large effusion with left hemithorax opacification and collapse of left lung. Chest tube placed 7/26.    PT Comments    The patient continues with decreased balance during ambulation , requires HHA or ojects for stability. Patient plans to stay with parents at [place]. Patient will benefit from HHPT. Will make effort to work on gait again today as schedule allows.   Follow Up Recommendations  Home health PT     Equipment Recommendations  Rolling walker with 5" wheels    Recommendations for Other Services       Precautions / Restrictions Precautions Precautions: Fall Precaution Comments: gait very unsteady Restrictions Weight Bearing Restrictions: No    Mobility  Bed Mobility         Supine to sit: Supervision        Transfers Overall transfer level: Needs assistance Equipment used: 1 person hand held assist Transfers: Sit to/from Stand Sit to Stand: Min assist         General transfer comment: steady assistance to stand from bed and toilet, mild balance loss., wide BOS  Ambulation/Gait Ambulation/Gait assistance: Min assist Gait Distance (Feet): 25 Feet (then 20') Assistive device: 1 person hand held assist Gait Pattern/deviations: Step-through pattern;Staggering right;Staggering left Gait velocity: decr   General Gait Details: required steady assistance for balance while moving, stands with tendency to sway , wide BOS, High arm  guard.   Stairs             Wheelchair Mobility    Modified Rankin (Stroke Patients Only)       Balance Overall balance assessment: Needs assistance   Sitting balance-Leahy Scale: Good     Standing balance support: During functional activity;No upper extremity supported Standing balance-Leahy Scale: Fair Standing balance comment: requires support for dynamic balance, holds to objects or HHA                            Cognition   Behavior During Therapy: [facility] for tasks assessed/performed;Impulsive   Area of Impairment: Safety/judgement;Awareness                         Safety/Judgement: Decreased awareness of safety Awareness: Anticipatory   General Comments: somewhat euphoric, laughing frequently      Exercises      General Comments        Pertinent Vitals/Pain Pain Assessment: No/denies pain    Home Living                      Prior Function            PT Goals (current goals can now be found in the care plan section) Progress towards PT goals: Progressing toward goals    Frequency    Min 3X/week      PT Plan Current plan remains appropriate    Co-evaluation              AM-PAC PT "6 Clicks" Mobility  Outcome Measure  Help needed turning from your back to your side while in a flat bed without using bedrails?: None Help needed moving from lying on your back to sitting on the side of a flat bed without using bedrails?: None Help needed moving to and from a bed to a chair (including a wheelchair)?: A Little Help needed standing up from a chair using your arms (e.g., wheelchair or bedside chair)?: A Little Help needed to walk in hospital room?: A Lot Help needed climbing 3-5 steps with a railing? : A Lot 6 Click Score: 18    End of Session   Activity Tolerance: Patient tolerated treatment well Patient left: in chair;with call bell/phone within reach;with chair alarm set Nurse Communication:  Mobility status PT Visit Diagnosis: Unsteadiness on feet (R26.81);Difficulty in walking, not elsewhere classified (R26.2)     Time: 5110-2111 PT Time Calculation (min) (ACUTE ONLY): 29 min  Charges:  $Gait Training: 8-22 mins $Self Care/Home Management: [place] Pager [phone] Office [phone]    Claretha Cooper 08/13/2019, 9:28 AM

## 2019-08-13 NOTE — Progress Notes (Signed)
LB PCCM  S: feels OK, chest tube out, no dyspnea, cough, mucus production or chest discomfort  O:  Vitals:   08/13/19 0500 08/13/19 0600 08/13/19 0700 08/13/19 0800  BP: (!) 123/63 (!) 132/67  120/73  Pulse: 83 77 90 78  Resp: 22 17 19 22   Temp:      TempSrc:      SpO2: 97% 97% 97% 97%  Weight: 80.1 kg     Height:       RA  General:  Resting comfortably in bed HENT: NCAT OP clear PULM: CTA B, normal effort CV: RRR, no mgr GI: BS+, soft, nontender MSK: normal bulk and tone Neuro: awake, alert, no distress, MAEW  CXR images personally reviewed> chest tube out, no effusion on left, haziness in right lung: infiltrate?  Impression/plan: Advanced cirrhosis: per primary/GI medicine, needs outpatient GI follow up Left Hemothorax> resolved, presumably trauma related?  No clear cause seen on imaging or history.  Had atelectasis/consolidation in left lower lobe on chest imaging, so will need outpatient pulmonary follow up to address this and repeat CT chest in 2-3 months New airspace disease in right lung> clinically she looks OK, no hypoxemia or dyspnea, lungs clear on exam; would not start new treatment, repeat CXR now.  If clear then she can go home  Roselie Awkward, MD [place] Pager: [phone] Cell: [phone] If no response, call [phone]

## 2019-08-14 LAB — PROTIME-INR
INR: 2 — ABNORMAL HIGH (ref 0.8–1.2)
Prothrombin Time: 21.8 seconds — ABNORMAL HIGH (ref 11.4–15.2)

## 2019-08-14 MED ORDER — PHYTONADIONE 5 MG PO TABS: 5.0000 mg | ORAL_TABLET | Freq: Every day | ORAL | 1 refills | Status: DC

## 2019-08-14 MED ORDER — POTASSIUM CHLORIDE CRYS ER 20 MEQ PO TBCR: 20.0000 meq | EXTENDED_RELEASE_TABLET | Freq: Two times a day (BID) | ORAL | 6 refills | Status: DC

## 2019-08-14 MED ORDER — PANTOPRAZOLE SODIUM 40 MG PO TBEC: 40.0000 mg | DELAYED_RELEASE_TABLET | Freq: Every day | ORAL | 3 refills | Status: DC

## 2019-08-14 NOTE — Progress Notes (Signed)
Occupational Therapy Treatment Patient Details Name: Dana Watkins MRN: [id number] DOB: [birth year] Today's Date: 08/14/2019    History of present illness 73 with HTN, CAD, CKD, migraines, mitral regurg, pancytopenia, cirrhosisRecently evaluated in the ED on 7/28 for bronchitis with left pleural effusion. Treated with azithromycin with no improvement.Came back to the ED with worsening dyspnea, chest discomfort. Chest x-ray with significantly increased large left effusion. hemoglobin of 4.5 with elevated INR.CT chest 08/06/2019- No PE, large effusion with left hemithorax opacification and collapse of left lung. Chest tube placed 7/26.   OT comments  Patient receptive to energy conservation and fall prevention education today, but declined any ADLs or functional mobility training stating that she prefers to save her energy for her d/c home today.  Patient limited by decreased safety awareness, along with deficits noted below. Pt is declining home health therapies, and was educated on adjusting her parent's walker at home to her height, and on building energy gradually and safely without assistance of a therapist.  Pt receptive, but remain concerned that pt is not fully aware of her current impairments, and remains a high fall risk per review of recent PT and OT documentation.    Follow Up Recommendations  No OT follow up;Supervision/Assistance - 24 hour    Equipment Recommendations  Tub/shower seat (Pt reports that she can order a shower chair online.)    Recommendations for Other Services      Precautions / Restrictions Precautions Precautions: Fall       Mobility                       General transfer comment: Pt seated in recliner and declining mobilization. Discussed fall prevention and energy conservation at home.  Pt shown how to adjust walker to her height at home.  Pt stated that her parents have walkers for her to use and pt endorsed furniture surfing as well.  Pt  educated on benefits of a walker vs furniture walking for added safety, posture and energy conservation. Pt educated to keep all walkways in home clear, and remove area or decorative rugs. Use of nightlights and increased time before getting OOB for bathroom at night recommended to prevent falls. Handout on energy conservation for ADLs and functional mobility provided and discussed.                                               ADL either performed or assessed with clinical judgement   ADL Overall ADL's :  (Pt declined ADL retraining today due to anticipate d/c home today (d/c order in chart) and wants to save energy.)               Lower Body Bathing Details (indicate cue type and reason): Recommedned that pt have close supervision during 1st shower at home for safety. Pt agreed.                   Tub/Shower Transfer Details (indicate cue type and reason): Pt educated on energy conservation and fall prevention at home both in the shower, and when mobilizing around home and to bathroom. Recommended a shower chair and long bath brush/sponge for increased safety and energy conservation.  Cognition Arousal/Alertness: Awake/alert Behavior During Therapy: WFL for tasks assessed/performed;Impulsive   Area of Impairment: Safety/judgement;Awareness;Problem solving                         Safety/Judgement: Decreased awareness of safety   Problem Solving: Requires verbal cues General Comments: Decreased awareness of impairments.                    General Comments      Pertinent Vitals/ Pain       Pain Assessment: No/denies pain  Home Living                                          Prior Functioning/Environment              Frequency  Min 2X/week        Progress Toward Goals  OT Goals(current goals can now be found in the care plan section)  Progress towards OT goals: Not  progressing toward goals - comment (Limited as pt declined demonstrating ADLs or mobility. Education session only.)  Acute Rehab OT Goals Patient Stated Goal: Going home. OT Goal Formulation: With patient  Plan Discharge plan remains appropriate    Co-evaluation                 AM-PAC OT "6 Clicks" Daily Activity     Outcome Measure   Help from another person eating meals?: None Help from another person taking care of personal grooming?: None Help from another person toileting, which includes using toliet, bedpan, or urinal?: A Little Help from another person bathing (including washing, rinsing, drying)?: A Little Help from another person to put on and taking off regular upper body clothing?: A Little Help from another person to put on and taking off regular lower body clothing?: A Little 6 Click Score: 20    End of Session    OT Visit Diagnosis: Unsteadiness on feet (R26.81);Pain   Activity Tolerance Patient limited by fatigue   Patient Left in chair;with call bell/phone within reach;with chair alarm set   Nurse Communication Mobility status        Time: 0601-5615 OT Time Calculation (min): 16 min  Charges: OT General Charges $OT Visit: 1 Visit OT Treatments $Self Care/Home Management : 8-22 mins  Anderson [place], [place] Office: [phone] 08/14/2019    Julien Girt 08/14/2019, 1:41 PM

## 2019-08-14 NOTE — Discharge Summary (Signed)
Physician Discharge Summary  Patient ID: Dana Watkins MRN: [id number] DOB/AGE: August 24, 1955 64 y.o.  Admit date: 08/06/2019 Discharge date: 08/14/2019  Admission Diagnoses: Acute coronary syndrome Early congestive heart failure CKD, IIIb Pancytopenia Liver cirrhosis Alcohol use disorder Hypokalemia  Discharge Diagnoses:  Principle Problem: Acute NSTEMI Active Problems:   Acute on chronic respiratory failure (HCC)   Left hemothorax, improved   Mild right pleural effusion   Advanced liver cirrhosis with jaundice   Portal hypertension   Ascites   Anemia of blood loss   Alcohol use disorder   Abnormal PT/INR from liver failure   Severe hypoproteinrmia   Moderate protein calorie malnutrition   CKD, II   Hypokalemia, improved   Pancytopenia  Discharged Condition: fair  Hospital Course: 63 years old white female PMH of CAD, arthritis, liver cirrhosis, CKD, Hypokalemia, vitamin D deficiency and pancytopenia was treated for acute bronchitis 1 week before admission. She had under the left breast change pain with shortness of breath and EKG changes of diffuse ST-T changes, anemia, elevated Troponin I and BNP and vascular congestion on chest x-ray.  She had significant anemia and elevated PT/INR. She received PRBC 2 units x 2 and FFP. She developed right lung collapse with large left pleural effusion. Pulmonary consult was obtained and initially 1.2 l of left sided bloody pleural effusion was removed and next day post chest tube placement addition 3+ L of bloody pleural effusion was removed. The chest tube drained slowed after 72 hours and it was removed with development of small bilateral pleural effusion.  Patient had GI consult and OP follow up was recommended. Her PT/INR remained high even when she received daily vitamin K supplement. Patient agrees to give up alcohol and follow heart healthy diet. She is not a candidate for cardiac interventions due to her tendency to bleed easily,  portal HTN, pancytopenia and generalized weakness with possible short life span with her chronic and advanced liver cirrhosis. She will see primary doctor in 1 week and me in 2 weeks and Renal, GI and Pulmonary doctors as arranged.  Consults: cardiology, pulmonary/intensive care and GI  Significant Diagnostic Studies: labs: Elevated WBC count and very low Hgb of 4.5 gm. Low platelets count. Blood group O positive. Elevated Troponin I level of 245 ng.  Elevated BNP of 153.2 pg. LDL cholesterol of 111 mg., Triglycerides of 40 mg and HDL of 51 mg. Pleural fluid culture was negative. Creatinine was 1.69 mg. On admission and 1.26 on day before dischargePotassium was 3.1, post supplementation it was 4.5  EKG: SR, non-specific ST-T changes.  CXR on admission: Cardiomegaly and mild pulmonary venous congestion. Moderate left pleural effusion CXR on discharge: Small bilateral pleural effusions.  CT angio chest on 08/06/2019 : No PE. Large left pleural effusion with complete collapse of the left lung.  CT chest 08/08/2019 : Significant reduction in left sided hemothorax, new right sided pleural effusion and new ascites.   Echocardiogram on 08/07/2019: Inferior wall hypokinesia with 40-45 % EF, mild MR + TR.  Treatments: respiratory therapy: O2 and procedures: thoracentesis positive for blood, negative for infection.  Discharge Exam: Blood pressure (!) 116/54, pulse 83, temperature 98 F (36.7 C), temperature source Oral, resp. rate 19, height 5' 8"  (1.727 m), weight 80.1 kg, SpO2 95 %. General appearance: alert, cooperative and appears stated age. Head: Normocephalic, atraumatic. Eyes: Blue eyes, pale conjunctiva, corneas clear. Mild icterus.  Neck: No adenopathy, no carotid bruit, no JVD, supple, symmetrical, trachea midline and thyroid not enlarged. Resp:  Basal crackles to auscultation bilaterally. Cardio: Regular rate and rhythm, S1, S2 normal, II/VI systolic murmur, no click, rub or  gallop. GI: Soft, non-tender; bowel sounds normal; no organomegaly. Extremities: No edema, cyanosis or clubbing. Skin: Warm and dry.  Neurologic: Alert and oriented X 3, normal strength and tone. Normal coordination and gait.  Disposition: Discharge disposition: 01-Home or Self Care        Allergies as of 08/14/2019      Reactions   Sulfa Antibiotics Nausea And Vomiting, Rash      Medication List    STOP taking these medications   ciprofloxacin 500 MG tablet Commonly known as: CIPRO   potassium chloride 10 MEQ tablet Commonly known as: KLOR-CON   potassium chloride 10 MEQ tablet Commonly known as: KLOR-CON Replaced by: potassium chloride SA 20 MEQ tablet     TAKE these medications   butalbital-acetaminophen-caffeine 50-325-40 MG tablet Commonly known as: FIORICET Take 1 tablet by mouth every 6 (six) hours as needed for headache or migraine.   folic acid 1 MG tablet Commonly known as: FOLVITE Take 1 tablet (1 mg total) by mouth daily.   MAGNESIUM PO Take 400 mg by mouth daily.   multivitamin with minerals Tabs tablet Take 1 tablet by mouth daily. What changed: additional instructions   ondansetron 4 MG disintegrating tablet Commonly known as: ZOFRAN-ODT DISSOLVE 1 TABLET UNDER TONGUE AS NEEDED EVERY 8 HOURS What changed: See the new instructions.   pantoprazole 40 MG tablet Commonly known as: PROTONIX Take 1 tablet (40 mg total) by mouth daily. Start taking on: August 15, 2019   phytonadione 5 MG tablet Commonly known as: VITAMIN K Take 1 tablet (5 mg total) by mouth daily. Start taking on: August 15, 2019   potassium chloride SA 20 MEQ tablet Commonly known as: KLOR-CON Take 1 tablet (20 mEq total) by mouth 2 (two) times daily. Replaces: potassium chloride 10 MEQ tablet Notes to patient: Second dose due today   spironolactone 25 MG tablet Commonly known as: Aldactone Take 1 tablet (25 mg total) by mouth daily. What changed:   when to take  this  reasons to take this   vitamin C 1000 MG tablet Take 1,000 mg by mouth in the morning and at bedtime. Chewable Notes to patient: Take as instructed       Follow-up Information    Dixie Dials, MD. Schedule an appointment as soon as possible for a visit in 2 week(s).   Specialty: Cardiology Contact information: [place] [address] [phone]        Lawerance Cruel, MD. Call in 1 week(s).   Specialty: Family Medicine Contact information: [place] [address] [phone]               Time spent: Review of old chart, current chart, lab, x-ray, cardiac tests and discussion with patient over 60 minutes.  Signed: Birdie Riddle 08/14/2019, 11:36 AM

## 2019-08-14 NOTE — Care Management Important Message (Signed)
Important Message  Patient Details IM Letter given to the Patient Name: Dana Watkins MRN: [id number] Date of Birth: [birth year]   Medicare Important Message Given:  Yes     [name] 08/14/2019, 10:21 AM

## 2019-08-14 NOTE — TOC Transition Note (Signed)
Transition of Care [facility]) - CM/SW Discharge Note   Patient Details  Name: Dana Watkins MRN: [id number] Date of Birth: 08/28/1955  [facility] CM/SW Contact:  Lynnell Catalan, RN Phone Number: 08/14/2019, 11:41 AM   Clinical Narrative:    This CM met with pt at bedside for dc planning. Pt informed of physical therapy recommendations. Pt states that she is going to her parents house at dc and declines home health services at this time. Pt states she will have all the equipment she needs a her parent's home.   Final next level of care: Home/Self Care Barriers to Discharge: No Barriers Identified   Patient Goals and CMS Choice Patient states their goals for this hospitalization and ongoing recovery are:: to go home and be better CMS Medicare.gov Compare Post Acute Care list provided to:: Patient   Discharge Plan and Services   Discharge Planning Services: CM Consult                 Readmission Risk Interventions Readmission Risk Prevention Plan 08/14/2019 12/11/2018  Transportation Screening Complete Complete  PCP or Specialist Appt within 3-5 Days Complete Complete  HRI or Home Care Consult Complete Complete  Social Work Consult for [place] Planning/Counseling Complete Complete  Palliative Care Screening Not Applicable Not Applicable  Medication Review [employer] photographer) Complete Referral to Pharmacy  Some recent data might be hidden

## 2019-08-21 ENCOUNTER — Inpatient Hospital Stay: Payer: Medicare (Managed Care)

## 2019-08-21 ENCOUNTER — Inpatient Hospital Stay: Payer: Medicare (Managed Care) | Attending: Hematology and Oncology

## 2019-08-21 DIAGNOSIS — K7460 Unspecified cirrhosis of liver: Secondary | ICD-10-CM | POA: Insufficient documentation

## 2019-08-21 DIAGNOSIS — D631 Anemia in chronic kidney disease: Secondary | ICD-10-CM | POA: Insufficient documentation

## 2019-08-21 DIAGNOSIS — N183 Chronic kidney disease, stage 3 unspecified: Secondary | ICD-10-CM | POA: Insufficient documentation

## 2019-08-21 DIAGNOSIS — Z79899 Other long term (current) drug therapy: Secondary | ICD-10-CM | POA: Insufficient documentation

## 2019-08-21 DIAGNOSIS — D696 Thrombocytopenia, unspecified: Secondary | ICD-10-CM | POA: Insufficient documentation

## 2019-08-23 ENCOUNTER — Ambulatory Visit: Payer: Medicare (Managed Care) | Admitting: Primary Care

## 2019-08-23 ENCOUNTER — Other Ambulatory Visit: Payer: Self-pay

## 2019-08-23 ENCOUNTER — Encounter: Payer: Self-pay | Admitting: Primary Care

## 2019-08-23 ENCOUNTER — Ambulatory Visit (INDEPENDENT_AMBULATORY_CARE_PROVIDER_SITE_OTHER): Payer: Medicare (Managed Care)

## 2019-08-23 ENCOUNTER — Telehealth: Payer: Self-pay | Admitting: Primary Care

## 2019-08-23 VITALS — BP 124/68 | HR 81 | Temp 97.1°F | Ht 68.0 in | Wt 142.8 lb

## 2019-08-23 DIAGNOSIS — R0600 Dyspnea, unspecified: Secondary | ICD-10-CM | POA: Diagnosis not present

## 2019-08-23 DIAGNOSIS — J942 Hemothorax: Secondary | ICD-10-CM | POA: Diagnosis not present

## 2019-08-23 LAB — CBC
HCT: 28.7 % — ABNORMAL LOW (ref 36.0–46.0)
Hemoglobin: 9.3 g/dL — ABNORMAL LOW (ref 12.0–15.0)
MCHC: 32.3 g/dL (ref 30.0–36.0)
MCV: 91.1 fl (ref 78.0–100.0)
Platelets: 110 10*3/uL — ABNORMAL LOW (ref 150.0–400.0)
RBC: 3.15 Mil/uL — ABNORMAL LOW (ref 3.87–5.11)
RDW: 19.6 % — ABNORMAL HIGH (ref 11.5–15.5)
WBC: 7 10*3/uL (ref 4.0–10.5)

## 2019-08-23 LAB — COMPREHENSIVE METABOLIC PANEL
ALT: 34 U/L (ref 0–35)
AST: 67 U/L — ABNORMAL HIGH (ref 0–37)
Albumin: 2.6 g/dL — ABNORMAL LOW (ref 3.5–5.2)
Alkaline Phosphatase: 294 U/L — ABNORMAL HIGH (ref 39–117)
BUN: 18 mg/dL (ref 6–23)
CO2: 20 mEq/L (ref 19–32)
Calcium: 9.5 mg/dL (ref 8.4–10.5)
Chloride: 113 mEq/L — ABNORMAL HIGH (ref 96–112)
Creatinine, Ser: 1.46 mg/dL — ABNORMAL HIGH (ref 0.40–1.20)
GFR: 36.11 mL/min — ABNORMAL LOW (ref >60.00)
Glucose, Bld: 83 mg/dL (ref 70–99)
Potassium: 4.2 mEq/L (ref 3.5–5.1)
Sodium: 139 mEq/L (ref 135–145)
Total Bilirubin: 4.1 mg/dL — ABNORMAL HIGH (ref 0.2–1.2)
Total Protein: 5.7 g/dL — ABNORMAL LOW (ref 6.0–8.3)

## 2019-08-23 LAB — BRAIN NATRIURETIC PEPTIDE: Pro B Natriuretic peptide (BNP): 190 pg/mL — ABNORMAL HIGH (ref 0.0–100.0)

## 2019-08-23 NOTE — Progress Notes (Signed)
@Patient  ID: Dana Watkins, female    DOB: 1955/06/07, 64 y.o.   MRN: [id number]  Chief Complaint  Patient presents with  . Follow-up    HFUx 1 wk.,sob with exertion, cough-dry, fells like can't get full breath,no energy    Referring provider: Lawerance Cruel, MD  HPI: 64 year old female, former smoker.  Past medical history significant for coronary artery disease, arthritis, liver cirrhosis, chronic kidney disease, hypokalemia.    Patient recently hospitalized 08/06/2019-08/14/2019 for acute NSTEMI, acute on chronic respiratory failure and left hemothorax/right pleural effusion.  Patient originally presented to ED with left breast pain and shortness of breath.  EKG showed diffuse ST changes.  Labs revealed patient was anemic and she had an elevated troponin and BNP.  Chest x-ray showed vascular congestion.  Pulmonary consulted and patient underwent left thoracentesis with 1.2 L bloody pleural fluid removed.  CT chest 08/08/2019 showed significant reduction in left-sided hemothorax, new right sided pleural effusion and new ascites.  Repeat chest x-ray on 08/13/2019 showed small bilateral pleural effusions, left lower lobe airspace disease with improved aeration compared to prior exam.  No pneumothorax.  Chest tube discontinued on 08/13/2019.  Left hemothorax resolved, presumably trauma related.  No clear cause seen on imaging or history. CVTS reviewed case, no surgical intervention at this time. She did have anemia on admission and has hx ETOH abuse. She had atelectasis/consolidation left lower lobe on chest imaging, patient will need outpatient pulmonary follow-up to address this and repeat CT chest in 2 to 3 months.  08/23/2019- Interim hx Patient presents today for hospital follow-up left hemothorax.   Patient had stat chest x-ray today before visit which showed increased left pleural effusion and basilar atelectasis. Breathing is about the same as when she was discharged. She has dif time taking  deep breath. Some discomfort left side. She is on oral spirolactone. Patient has advanced cirrhosis need outpatient GI follow-up.   TESTING/ Chest x-ray 7/20 >> mild left base atelectasis with pleural effusion CT chest 08/06/2019- No PE, large effusion with left hemithorax opacification and collapse of left lung. Pleural Cytology 7/29 >> atypical cells, favor reactive mesothelial cells  CXR 08/23/2019-  Mild hyperinflation. Previous right pleural effusion resolved. No pneumothorax. Increased LEFT pleural effusion and basilar atelectasis.  Allergies  Allergen Reactions  . Sulfa Antibiotics Nausea And Vomiting and Rash    Immunization History  Administered Date(s) Administered  . PFIZER SARS-COV-2 Vaccination 03/23/2019, 04/13/2019    Past Medical History:  Diagnosis Date  . Acute hypokalemia 11/26/2014  . Arthritis 05/15/2015  . Benign essential hypertension 05/15/2015  . C. difficile colitis   . CAD (coronary artery disease)    a. cath 04/2017: ""Diffuse, calcific CAD particularly in the distal RCA and proximal to mid LAD.  LAD disease is nonobstructive.  RCA disease is more severe but does not appear significant.  Given her lack of symptoms, would pursue medical therapy."  . CKD (chronic kidney disease), stage III   . Hypokalemia   . Migraine headache 05/15/2015  . Mild mitral regurgitation   . Pancytopenia ([place])   . Sepsis ([place]) 01/2017  . Thrombocytopenia ([place])    a. chronic thrombocytopenia ([facility] - remotely saw hematology).  . Vitamin D deficiency     Tobacco History: Social History   Tobacco Use  Smoking Status Former Smoker  . Years: 2.00  . Start date: 06/25/1968  . Quit date: 05/16/2014  . Years since quitting: 5.2  Smokeless Tobacco Never Used   Counseling given:  Not Answered   Outpatient Medications Prior to Visit  Medication Sig Dispense Refill  . Ascorbic Acid (VITAMIN C) 1000 MG tablet Take 1,000 mg by mouth in the morning and at bedtime. Chewable    .  butalbital-acetaminophen-caffeine (FIORICET) 50-325-40 MG tablet Take 1 tablet by mouth every 6 (six) hours as needed for headache or migraine.     . folic acid (FOLVITE) 1 MG tablet Take 1 tablet (1 mg total) by mouth daily. 30 tablet 0  . MAGNESIUM PO Take 400 mg by mouth daily.     . Multiple Vitamin (MULTIVITAMIN WITH MINERALS) TABS tablet Take 1 tablet by mouth daily. (Patient taking differently: Take 1 tablet by mouth daily. Woman's/ Gummie) 30 tablet 0  . ondansetron (ZOFRAN-ODT) 4 MG disintegrating tablet DISSOLVE 1 TABLET UNDER TONGUE AS NEEDED EVERY 8 HOURS (Patient taking differently: Take 4 mg by mouth every 8 (eight) hours as needed for nausea or vomiting. ) 20 tablet 0  . phytonadione (VITAMIN K) 5 MG tablet Take 1 tablet (5 mg total) by mouth daily. 30 tablet 1  . potassium chloride SA (KLOR-CON) 20 MEQ tablet Take 1 tablet (20 mEq total) by mouth 2 (two) times daily. 60 tablet 6  . spironolactone (ALDACTONE) 25 MG tablet Take 1 tablet (25 mg total) by mouth daily. (Patient taking differently: Take 25 mg by mouth daily as needed (swelling). ) 30 tablet 0  . pantoprazole (PROTONIX) 40 MG tablet Take 1 tablet (40 mg total) by mouth daily. (Patient not taking: Reported on 08/23/2019) 30 tablet 3   No facility-administered medications prior to visit.    Review of Systems  Review of Systems  Constitutional: Negative for fever.  Respiratory: Positive for shortness of breath. Negative for cough, chest tightness, wheezing and stridor.   Gastrointestinal: Positive for abdominal distention. Negative for abdominal pain, constipation and diarrhea.    Physical Exam  BP 124/68 (BP Location: Left Arm, Cuff Size: Normal)   Pulse 81   Temp (!) 97.1 F (36.2 C) (Oral)   Ht 5' 8"  (1.727 m)   Wt 142 lb 12.8 oz (64.8 kg)   SpO2 99%   BMI 21.71 kg/m  Physical Exam Constitutional:      General: She is not in acute distress.    Appearance: Normal appearance. She is not ill-appearing.    Cardiovascular:     Rate and Rhythm: Normal rate and regular rhythm.  Pulmonary:     Comments: Mostly CTA, slightly diminished left base  Neurological:     Mental Status: She is alert.  Psychiatric:        Mood and Affect: Mood normal.        Behavior: Behavior normal.        Thought Content: Thought content normal.        Judgment: Judgment normal.        Lab Results:  CBC    Component Value Date/Time   WBC 7.0 08/23/2019 1030   RBC 3.15 (L) 08/23/2019 1030   HGB 9.3 (L) 08/23/2019 1030   HGB 9.3 (L) 07/12/2019 1122   HGB 7.9 (L) 02/15/2019 1012   HGB 12.6 11/25/2014 1532   HCT 28.7 (L) 08/23/2019 1030   HCT 23.2 (L) 02/15/2019 1012   HCT 38.5 11/25/2014 1532   PLT 110.0 (L) 08/23/2019 1030   PLT 55 (L) 07/12/2019 1122   PLT 82 (LL) 02/15/2019 1012   MCV 91.1 08/23/2019 1030   MCV 84 02/15/2019 1012   MCV 87.9 11/25/2014 1532  MCH 28.8 08/13/2019 0750   MCHC 32.3 08/23/2019 1030   RDW 19.6 (H) 08/23/2019 1030   RDW 17.0 (H) 02/15/2019 1012   RDW 15.9 (H) 11/25/2014 1532   LYMPHSABS 1.2 08/13/2019 0750   LYMPHSABS 1.2 02/15/2019 1012   LYMPHSABS 1.2 11/25/2014 1532   MONOABS 1.2 (H) 08/13/2019 0750   MONOABS 0.4 11/25/2014 1532   EOSABS 0.6 (H) 08/13/2019 0750   EOSABS 0.2 02/15/2019 1012   BASOSABS 0.1 08/13/2019 0750   BASOSABS 0.1 02/15/2019 1012   BASOSABS 0.0 11/25/2014 1532    BMET    Component Value Date/Time   NA 139 08/23/2019 1030   NA 139 02/15/2019 1012   NA 141 11/25/2014 1532   K 4.2 08/23/2019 1030   K 2.7 (LL) 11/25/2014 1532   CL 113 (H) 08/23/2019 1030   CO2 20 08/23/2019 1030   CO2 22 11/25/2014 1532   GLUCOSE 83 08/23/2019 1030   GLUCOSE 113 11/25/2014 1532   BUN 18 08/23/2019 1030   BUN 11 02/15/2019 1012   BUN 16.4 11/25/2014 1532   CREATININE 1.46 (H) 08/23/2019 1030   CREATININE 1.36 (H) 07/12/2019 1122   CREATININE 1.4 (H) 11/25/2014 1532   CALCIUM 9.5 08/23/2019 1030   CALCIUM 10.4 11/25/2014 1532   GFRNONAA 45 (L)  08/13/2019 0750   GFRNONAA 41 (L) 07/12/2019 1122   GFRAA 53 (L) 08/13/2019 0750   GFRAA 48 (L) 07/12/2019 1122    BNP    Component Value Date/Time   BNP 153.2 (H) 08/07/2019 0030    ProBNP    Component Value Date/Time   PROBNP 190.0 (H) 08/23/2019 1030    Imaging: DG Chest 2 View  Result Date: 08/23/2019 CLINICAL DATA:  Follow-up hemothorax EXAM: CHEST - 2 VIEW COMPARISON:  08/13/2019 FINDINGS: Normal heart size, mediastinal contours, and pulmonary vascularity. Increased LEFT pleural effusion and basilar atelectasis. Mild underlying hyperinflation. Resolution of previously identified RIGHT pleural effusion. No pneumothorax. New radiopacities in RIGHT upper quadrant question medication tablets. IMPRESSION: Increased LEFT pleural effusion and basilar atelectasis. Electronically Signed   By: Lavonia Dana M.D.   On: 08/23/2019 10:02   DG Chest 2 View  Result Date: 08/13/2019 CLINICAL DATA:  Left lung pneumonia EXAM: CHEST - 2 VIEW COMPARISON:  08/13/2019 FINDINGS: Small bilateral pleural effusions. Hazy right upper lobe airspace disease. Left lower lobe airspace disease with improved aeration compared with the prior exam. No pneumothorax. Stable cardiomediastinal silhouette. No aggressive osseous lesion. IMPRESSION: Small bilateral pleural effusions. Hazy right upper lobe airspace disease concerning for pneumonia. Left lower lobe airspace disease with improved aeration compared with the prior exam. Electronically Signed   By: Kathreen Devoid   On: 08/13/2019 12:12   CT CHEST WO CONTRAST  Result Date: 08/08/2019 CLINICAL DATA:  Follow-up hemothorax EXAM: CT CHEST WITHOUT CONTRAST TECHNIQUE: Multidetector CT imaging of the chest was performed following the standard protocol without IV contrast. COMPARISON:  08/06/2019 CT, plain film from earlier in the same day. FINDINGS: Cardiovascular: Cardiac size is stable. Aortic calcifications and coronary calcifications are noted. Known pericardial effusion  is noted. The previously seen deviation of the heart to the right has resolved. Mediastinum/Nodes: Thoracic inlet is within normal limits. No definitive hilar or mediastinal adenopathy is noted. The previously seen mediastinal shift has resolved following drainage on the left. The esophagus is within normal limits as visualized. Lungs/Pleura: Pigtail catheter is noted in the left lung base anteriorly. The previously seen hemothorax has reduced significantly in size. There is improved aeration in the  left lung although persistent lower lobe consolidation remains. The right lung demonstrates some increased parenchymal opacity likely representing re-expansion edema related to the reduction of the mediastinal shift. Small right-sided pleural effusion is noted. Mild lower lobe consolidation is seen. Upper Abdomen: Visualized upper abdomen shows new ascites surrounding the liver. Musculoskeletal: No acute bony abnormality is noted. Previously seen left rib fractures are again identified. IMPRESSION: Significant reduction in left-sided hemothorax following catheter placement. Significant improved aeration of the left lung is noted although residual small effusion and lower lobe consolidation remains. The catheter is somewhat anteriorly oriented with respect to the residual fluid. No pneumothorax is noted. New patchy airspace opacity in the right lung likely representing edema from re-expansion related to the reduction of the mediastinal shift. Small right-sided pleural effusion and lower lobe consolidation is noted new from the prior exam. New ascites in the upper abdomen adjacent to the liver. This may represent some third spacing of fluid related to changes following reduction of the left-sided hemothorax. Electronically Signed   By: Inez Catalina M.D.   On: 08/08/2019 22:52   CT ANGIO CHEST PE W OR WO CONTRAST  Result Date: 08/06/2019 CLINICAL DATA:  Shortness of breath and chest pain EXAM: CT ANGIOGRAPHY CHEST WITH  CONTRAST TECHNIQUE: Multidetector CT imaging of the chest was performed using the standard protocol during bolus administration of intravenous contrast. Multiplanar CT image reconstructions and MIPs were obtained to evaluate the vascular anatomy. CONTRAST:  168m OMNIPAQUE IOHEXOL 350 MG/ML SOLN COMPARISON:  07/31/2019 FINDINGS: Cardiovascular: Thoracic aorta and its branches are well visualized. No significant atherosclerotic calcifications are seen. No aneurysmal dilatation is noted. Coronary calcifications are seen. No cardiac enlargement is noted. The heart is deviated to the right related to large left pleural effusion. The pulmonary artery shows a normal branching pattern without definitive filling defect to suggest pulmonary embolism. The peripheral branches are somewhat limited in opacification due to timing of the contrast bolus. Mediastinum/Nodes: Thoracic inlet is within normal limits. No hilar or mediastinal adenopathy is noted. Mild mediastinal shift to the right is seen related to the large left pleural effusion. Esophagus is within normal limits. Lungs/Pleura: Previously seen small effusion now occupies the entire left chest cavity with consolidation of the left lung. No pneumothorax is seen. Right lung shows no focal infiltrate or sizable effusion. Upper Abdomen: Hepatic and renal cysts are noted. Remainder of the upper abdomen is within normal limits. Musculoskeletal: Degenerative changes of the thoracic spine are noted. Multiple rib fractures are noted on the left involving the third, fourth, sixth, eighth and ninth ribs. No pneumothorax is noted. Review of the MIP images confirms the above findings. IMPRESSION: Multiple old rib fractures on the left. No evidence of pulmonary emboli. Large left pleural effusion with complete opacification of the left hemithorax. This is a significant increased from the most recent exam of 07/31/2019. Complete collapse of the left lung is noted. Electronically  Signed   By: MInez CatalinaM.D.   On: 08/06/2019 23:36   DG Chest Port 1 View  Result Date: 08/13/2019 CLINICAL DATA:  Left hemothorax EXAM: PORTABLE CHEST 1 VIEW COMPARISON:  08/12/2019 FINDINGS: Cardiac enlargement. Patchy airspace disease in the right lung more prominent in the apex and base. This suggests mild progression since prior study. Small right pleural effusion is unchanged. IMPRESSION: Patchy airspace disease in the right lung with small right pleural effusion demonstrating mild progression since prior study. Electronically Signed   By: WLucienne CapersM.D.   On: 08/13/2019 06:37  DG Chest Port 1 View  Result Date: 08/12/2019 CLINICAL DATA:  Acute respiratory failure. EXAM: PORTABLE CHEST 1 VIEW COMPARISON:  08/11/2019 and prior studies. FINDINGS: Hazy airspace lung opacity, in the right mid to upper lung, appears slightly improved from the previous day's exam. Lung base opacity, greatest on the right, is similar to the previous day's exam. No new lung abnormalities. No pneumothorax. Pigtail catheter projecting over the left hemidiaphragm is stable. IMPRESSION: 1. Slight improvement in hazy airspace opacity in the right mid to upper lung. 2. No other change from the previous day's exam. Persistent lung base opacity most evident on the right. Electronically Signed   By: Lajean Manes M.D.   On: 08/12/2019 08:53   DG CHEST PORT 1 VIEW  Result Date: 08/11/2019 CLINICAL DATA:  Pleural effusion.  Follow-up study. EXAM: PORTABLE CHEST 1 VIEW COMPARISON:  08/10/2019 and older exams.  CT dated 07/09/2019. FINDINGS: Cardiac silhouette is mildly enlarged. Interstitial hazy airspace opacity in the right upper lung is stable. There is more confluent opacity at the right lung base obscuring hemidiaphragm. Mild opacity is noted at the left lung base. Pigtail chest tube ir catheter projects over the left hemidiaphragm, stable. No pneumothorax. IMPRESSION: 1. No significant change from the previous day's  exam. 2. Interstitial and hazy airspace opacity in the right upper lobe consistent pneumonia. 3. More confluent lung base opacity, greater on the right, which may reflect pneumonia or atelectasis, associated with small pleural effusions as noted on the recent prior chest CT. Electronically Signed   By: Lajean Manes M.D.   On: 08/11/2019 10:14   DG CHEST PORT 1 VIEW  Result Date: 08/10/2019 CLINICAL DATA:  Pleural effusion. EXAM: PORTABLE CHEST 1 VIEW COMPARISON:  CT 08/08/2019.  Chest x-ray 08/08/2019, 08/07/2019. FINDINGS: Left chest tube in stable position. No pneumothorax. Cardiomegaly. Progressive bilateral pulmonary infiltrates/edema, most prominent right upper lung. Small left pleural effusion unchanged. Small right pleural effusion noted on today's exam. CHF could present this fashion. Bilateral pneumonia cannot be excluded. IMPRESSION: 1.  Left chest tube in stable position.  No pneumothorax. 2.  Cardiomegaly. 3. Progressive bilateral pulmonary infiltrates/edema, most prominent right upper lung. Small left pleural effusion unchanged. Small right pleural effusion noted on today's exam. CHF could present in this fashion. Bilateral pneumonia cannot be excluded. Electronically Signed   By: Marcello Moores  Register   On: 08/10/2019 05:49   DG CHEST PORT 1 VIEW  Result Date: 08/08/2019 CLINICAL DATA:  Previous pleural effusion EXAM: PORTABLE CHEST 1 VIEW COMPARISON:  August 07, 2019 FINDINGS: Drainage catheter at inferior left base region. There is left lower lobe consolidation with small left pleural effusion. Lungs elsewhere clear. Heart is mildly enlarged with mild pulmonary venous hypertension. No adenopathy. No pneumothorax. No bone lesions appreciable. IMPRESSION: Drainage catheter inferior left base noted. Small left pleural effusion. Left lower lobe consolidation concerning for atelectasis with potential superimposed pneumonia. Right lung clear. Stable cardiac prominence with a degree of pulmonary vascular  congestion. Electronically Signed   By: Lowella Grip III M.D.   On: 08/08/2019 08:26   DG CHEST PORT 1 VIEW  Result Date: 08/07/2019 CLINICAL DATA:  64 year old female with left side pain. Left chest tube placed for large left pleural effusion. EXAM: PORTABLE CHEST 1 VIEW COMPARISON:  Portable chest 1635 hours today and earlier. FINDINGS: Portable AP semi upright view at 1904 hours. Left pigtail pleural catheter appears stable, with subtotal drainage of the large left pleural effusion since yesterday. Substantially improved left lung ventilation, mild  residual veiling opacity at the left base. Stable cardiac size and mediastinal contours. Visualized tracheal air column is within normal limits. No pneumothorax or pulmonary edema. No new pulmonary opacity. Stable visualized osseous structures. IMPRESSION: 1. Stable left pleural catheter with subtotal drainage of the large left pleural effusion since yesterday. 2. No new cardiopulmonary abnormality. Electronically Signed   By: Genevie Ann M.D.   On: 08/07/2019 19:22   DG CHEST PORT 1 VIEW  Result Date: 08/07/2019 CLINICAL DATA:  Status post chest tube placement. EXAM: PORTABLE CHEST 1 VIEW COMPARISON:  Radiograph earlier this day.  Chest CT yesterday. FINDINGS: Pigtail catheter placement at the left lung base. Decreased size of left pleural effusion with improving aeration in the left hemithorax. No visualized pneumothorax. Moderate volume of pleural fluid persists with probable atelectasis throughout the left lung. No focal abnormality in the right lung. IMPRESSION: Pigtail catheter placement at the left lung base. Decreased size of left pleural effusion with improving aeration in the left hemithorax. No visualized pneumothorax. Small to moderate volume of left pleural fluid persists with atelectasis versus airspace disease throughout the left lung. Electronically Signed   By: Keith Rake M.D.   On: 08/07/2019 17:09   DG Chest Port 1 View  Result  Date: 08/07/2019 CLINICAL DATA:  Post thoracentesis EXAM: PORTABLE CHEST 1 VIEW COMPARISON:  Portable exam 1307 hours compared to 07/31/2019 chest radiograph and CT angio chest 08/06/2019 FINDINGS: Enlargement of cardiac silhouette with vascular congestion. Large LEFT pleural effusion though decreased since previous study. No pneumothorax. Persistent mild tracheal deviation to the RIGHT. RIGHT lung clear. Bones demineralized. IMPRESSION: Decreased LEFT pleural effusion post thoracentesis. No pneumothorax. Electronically Signed   By: Lavonia Dana M.D.   On: 08/07/2019 13:19   DG Chest Portable 1 View  Result Date: 07/31/2019 CLINICAL DATA:  Cough. EXAM: PORTABLE CHEST 1 VIEW COMPARISON:  04/16/2019. FINDINGS: Mediastinum hilar structures normal. Cardiomegaly with mild pulmonary venous congestion. Mild left base atelectasis/infiltrate with moderate left pleural effusion. IMPRESSION: 1.  Cardiomegaly with mild pulmonary venous congestion. 2. Mild left base atelectasis/infiltrate with moderate left pleural effusion. Electronically Signed   By: Marcello Moores  Register   On: 07/31/2019 13:39   ECHOCARDIOGRAM COMPLETE  Result Date: 08/07/2019    ECHOCARDIOGRAM REPORT   Patient Name:   Dana Watkins Date of Exam: 08/07/2019 Medical Rec #:  [id number]        Height:       68.0 in Accession #:    [id number]       Weight:       143.9 lb Date of Birth:  1955-11-08       BSA:          1.777 m Patient Age:    46 years         BP:           129/106 mmHg Patient Gender: F                HR:           99 bpm. Exam Location:  Inpatient Procedure: 2D Echo, Cardiac Doppler and Color Doppler Indications:     Acute Cornonary Syndrome I24.9  History:         Patient has prior history of Echocardiogram examinations, most                  recent 10/31/2018. CAD; Risk Factors:Hypertension and Former  Smoker. DOE. Elevated troponin. MR.  Sonographer:     Vickie Epley RDCS Referring Phys:  [place] Diagnosing Phys:  Dixie Dials MD IMPRESSIONS  1. Left ventricular ejection fraction, by estimation, is 40 to 45%. The left ventricle has mildly decreased function. The left ventricle demonstrates regional wall motion abnormalities (see scoring diagram/findings for description). Left ventricular diastolic parameters are consistent with Grade I diastolic dysfunction (impaired relaxation). There is mild hypokinesis of the left ventricular, basal inferior wall and inferoseptal wall.  2. Right ventricular systolic function is normal. The right ventricular size is normal.  3. Left atrial size was mildly dilated.  4. The pericardial effusion is circumferential. Large pleural effusion in the left lateral region.  5. The mitral valve is normal in structure. Mild mitral valve regurgitation.  6. The aortic valve is tricuspid. Aortic valve regurgitation is not visualized. Mild aortic valve sclerosis is present, with no evidence of aortic valve stenosis.  7. The inferior vena cava is dilated in size with <50% respiratory variability, suggesting right atrial pressure of 15 mmHg. FINDINGS  Left Ventricle: Left ventricular ejection fraction, by estimation, is 40 to 45%. The left ventricle has mildly decreased function. The left ventricle demonstrates regional wall motion abnormalities. Mild hypokinesis of the left ventricular, basal inferior wall and inferoseptal wall. The left ventricular internal cavity size was normal in size. There is no left ventricular hypertrophy. Left ventricular diastolic parameters are consistent with Grade I diastolic dysfunction (impaired relaxation).  LV Wall Scoring: The basal inferolateral segment and basal inferior segment are hypokinetic. The entire anterior wall, antero-lateral wall, mid and distal lateral wall, entire septum, entire apex, and mid and distal inferior wall are normal. Right Ventricle: The right ventricular size is normal. No increase in right ventricular wall thickness. Right ventricular systolic  function is normal. Left Atrium: Left atrial size was mildly dilated. Right Atrium: Right atrial size was normal in size. Pericardium: Trivial pericardial effusion is present. The pericardial effusion is circumferential. Mitral Valve: The mitral valve is normal in structure. Mild mitral valve regurgitation. Tricuspid Valve: The tricuspid valve is normal in structure. Tricuspid valve regurgitation is mild. Aortic Valve: The aortic valve is tricuspid. Aortic valve regurgitation is not visualized. Mild aortic valve sclerosis is present, with no evidence of aortic valve stenosis. Pulmonic Valve: The pulmonic valve was normal in structure. Pulmonic valve regurgitation is not visualized. Aorta: The aortic root is normal in size and structure. Venous: The inferior vena cava is dilated in size with less than 50% respiratory variability, suggesting right atrial pressure of 15 mmHg. IAS/Shunts: The interatrial septum was not assessed. Additional Comments: There is a large pleural effusion in the left lateral region.  LEFT VENTRICLE PLAX 2D LVOT diam:     1.90 cm      Diastology LV SV:         74           LV e' lateral:   13.40 cm/s LV SV Index:   42           LV E/e' lateral: 6.9 LVOT Area:     2.84 cm     LV e' medial:    10.10 cm/s                             LV E/e' medial:  9.2  LV Volumes (MOD) LV vol d, MOD A2C: 133.0 ml LV vol d, MOD A4C: 134.0 ml LV vol  s, MOD A2C: 65.4 ml LV vol s, MOD A4C: 80.2 ml LV SV MOD A2C:     67.6 ml LV SV MOD A4C:     134.0 ml LV SV MOD BP:      63.0 ml RIGHT VENTRICLE RV S prime:     15.40 cm/s TAPSE (M-mode): 2.0 cm LEFT ATRIUM             Index       RIGHT ATRIUM           Index LA Vol (A2C):   65.3 ml 36.75 ml/m RA Area:     11.50 cm LA Vol (A4C):   43.1 ml 24.26 ml/m RA Volume:   27.80 ml  15.65 ml/m LA Biplane Vol: 53.8 ml 30.28 ml/m  AORTIC VALVE LVOT Vmax:   169.00 cm/s LVOT Vmean:  105.000 cm/s LVOT VTI:    0.262 m  AORTA Ao Root diam: 2.80 cm MITRAL VALVE MV Area (PHT): 3.60  cm    SHUNTS MV Decel Time: 211 msec    Systemic VTI:  0.26 m MR Peak grad: 120.6 mmHg   Systemic Diam: 1.90 cm MR Vmax:      549.00 cm/s MV E velocity: 92.70 cm/s MV A velocity: 80.50 cm/s MV E/A ratio:  1.15 Dixie Dials MD Electronically signed by Dixie Dials MD Signature Date/Time: 08/07/2019/9:21:14 AM    Final      Assessment & Plan:   Hemothorax on left - Patient recently hospitalized 08/06/2019-08/14/2019, underwent left thoracentesis with 1.2L bloody pleural fluid removed. CVTS reviewed, no surgical intervention recommended. Chest tube discontinued 08/13/19. Left hemothorax resolved, presumably trauma related. Patient presented today for hospital follow-up, continues to have baseline shortness of breath and left sided discomfort. She denies trauma or recent ETOH use. She has hx advanced liver cirrhosis. CXR 08/23/2019 showed increased left sided pleural effusion. Discussed with Dr. Vaughan Browner, plan repeat thoracentesis. Recommend patient follow-up with GI sooner than scheduled mid-September      Martyn Ehrich, NP 08/24/2019

## 2019-08-23 NOTE — H&P (View-Only) (Signed)
@Patient  ID: Dana Watkins, female    DOB: 1955-07-18, 64 y.o.   MRN: [id number]  Chief Complaint  Patient presents with  . Follow-up    HFUx 1 wk.,sob with exertion, cough-dry, fells like can't get full breath,no energy    Referring provider: Lawerance Cruel, MD  HPI: 64 year old female, former smoker.  Past medical history significant for coronary artery disease, arthritis, liver cirrhosis, chronic kidney disease, hypokalemia.    Patient recently hospitalized 08/06/2019-08/14/2019 for acute NSTEMI, acute on chronic respiratory failure and left hemothorax/right pleural effusion.  Patient originally presented to ED with left breast pain and shortness of breath.  EKG showed diffuse ST changes.  Labs revealed patient was anemic and she had an elevated troponin and BNP.  Chest x-ray showed vascular congestion.  Pulmonary consulted and patient underwent left thoracentesis with 1.2 L bloody pleural fluid removed.  CT chest 08/08/2019 showed significant reduction in left-sided hemothorax, new right sided pleural effusion and new ascites.  Repeat chest x-ray on 08/13/2019 showed small bilateral pleural effusions, left lower lobe airspace disease with improved aeration compared to prior exam.  No pneumothorax.  Chest tube discontinued on 08/13/2019.  Left hemothorax resolved, presumably trauma related.  No clear cause seen on imaging or history. CVTS reviewed case, no surgical intervention at this time. She did have anemia on admission and has hx ETOH abuse. She had atelectasis/consolidation left lower lobe on chest imaging, patient will need outpatient pulmonary follow-up to address this and repeat CT chest in 2 to 3 months.  08/23/2019- Interim hx Patient presents today for hospital follow-up left hemothorax.   Patient had stat chest x-ray today before visit which showed increased left pleural effusion and basilar atelectasis. Breathing is about the same as when she was discharged. She has dif time taking  deep breath. Some discomfort left side. She is on oral spirolactone. Patient has advanced cirrhosis need outpatient GI follow-up.   TESTING/ Chest x-ray 7/20 >> mild left base atelectasis with pleural effusion CT chest 08/06/2019- No PE, large effusion with left hemithorax opacification and collapse of left lung. Pleural Cytology 7/29 >> atypical cells, favor reactive mesothelial cells  CXR 08/23/2019-  Mild hyperinflation. Previous right pleural effusion resolved. No pneumothorax. Increased LEFT pleural effusion and basilar atelectasis.  Allergies  Allergen Reactions  . Sulfa Antibiotics Nausea And Vomiting and Rash    Immunization History  Administered Date(s) Administered  . PFIZER SARS-COV-2 Vaccination 03/23/2019, 04/13/2019    Past Medical History:  Diagnosis Date  . Acute hypokalemia 11/26/2014  . Arthritis 05/15/2015  . Benign essential hypertension 05/15/2015  . C. difficile colitis   . CAD (coronary artery disease)    a. cath 04/2017: ""Diffuse, calcific CAD particularly in the distal RCA and proximal to mid LAD.  LAD disease is nonobstructive.  RCA disease is more severe but does not appear significant.  Given her lack of symptoms, would pursue medical therapy."  . CKD (chronic kidney disease), stage III   . Hypokalemia   . Migraine headache 05/15/2015  . Mild mitral regurgitation   . Pancytopenia ([place])   . Sepsis ([place]) 01/2017  . Thrombocytopenia ([place])    a. chronic thrombocytopenia ([facility] - remotely saw hematology).  . Vitamin D deficiency     Tobacco History: Social History   Tobacco Use  Smoking Status Former Smoker  . Years: 2.00  . Start date: 06/25/1968  . Quit date: 05/16/2014  . Years since quitting: 5.2  Smokeless Tobacco Never Used   Counseling given:  Not Answered   Outpatient Medications Prior to Visit  Medication Sig Dispense Refill  . Ascorbic Acid (VITAMIN C) 1000 MG tablet Take 1,000 mg by mouth in the morning and at bedtime. Chewable    .  butalbital-acetaminophen-caffeine (FIORICET) 50-325-40 MG tablet Take 1 tablet by mouth every 6 (six) hours as needed for headache or migraine.     . folic acid (FOLVITE) 1 MG tablet Take 1 tablet (1 mg total) by mouth daily. 30 tablet 0  . MAGNESIUM PO Take 400 mg by mouth daily.     . Multiple Vitamin (MULTIVITAMIN WITH MINERALS) TABS tablet Take 1 tablet by mouth daily. (Patient taking differently: Take 1 tablet by mouth daily. Woman's/ Gummie) 30 tablet 0  . ondansetron (ZOFRAN-ODT) 4 MG disintegrating tablet DISSOLVE 1 TABLET UNDER TONGUE AS NEEDED EVERY 8 HOURS (Patient taking differently: Take 4 mg by mouth every 8 (eight) hours as needed for nausea or vomiting. ) 20 tablet 0  . phytonadione (VITAMIN K) 5 MG tablet Take 1 tablet (5 mg total) by mouth daily. 30 tablet 1  . potassium chloride SA (KLOR-CON) 20 MEQ tablet Take 1 tablet (20 mEq total) by mouth 2 (two) times daily. 60 tablet 6  . spironolactone (ALDACTONE) 25 MG tablet Take 1 tablet (25 mg total) by mouth daily. (Patient taking differently: Take 25 mg by mouth daily as needed (swelling). ) 30 tablet 0  . pantoprazole (PROTONIX) 40 MG tablet Take 1 tablet (40 mg total) by mouth daily. (Patient not taking: Reported on 08/23/2019) 30 tablet 3   No facility-administered medications prior to visit.    Review of Systems  Review of Systems  Constitutional: Negative for fever.  Respiratory: Positive for shortness of breath. Negative for cough, chest tightness, wheezing and stridor.   Gastrointestinal: Positive for abdominal distention. Negative for abdominal pain, constipation and diarrhea.    Physical Exam  BP 124/68 (BP Location: Left Arm, Cuff Size: Normal)   Pulse 81   Temp (!) 97.1 F (36.2 C) (Oral)   Ht 5' 8"  (1.727 m)   Wt 142 lb 12.8 oz (64.8 kg)   SpO2 99%   BMI 21.71 kg/m  Physical Exam Constitutional:      General: She is not in acute distress.    Appearance: Normal appearance. She is not ill-appearing.    Cardiovascular:     Rate and Rhythm: Normal rate and regular rhythm.  Pulmonary:     Comments: Mostly CTA, slightly diminished left base  Neurological:     Mental Status: She is alert.  Psychiatric:        Mood and Affect: Mood normal.        Behavior: Behavior normal.        Thought Content: Thought content normal.        Judgment: Judgment normal.        Lab Results:  CBC    Component Value Date/Time   WBC 7.0 08/23/2019 1030   RBC 3.15 (L) 08/23/2019 1030   HGB 9.3 (L) 08/23/2019 1030   HGB 9.3 (L) 07/12/2019 1122   HGB 7.9 (L) 02/15/2019 1012   HGB 12.6 11/25/2014 1532   HCT 28.7 (L) 08/23/2019 1030   HCT 23.2 (L) 02/15/2019 1012   HCT 38.5 11/25/2014 1532   PLT 110.0 (L) 08/23/2019 1030   PLT 55 (L) 07/12/2019 1122   PLT 82 (LL) 02/15/2019 1012   MCV 91.1 08/23/2019 1030   MCV 84 02/15/2019 1012   MCV 87.9 11/25/2014 1532  MCH 28.8 08/13/2019 0750   MCHC 32.3 08/23/2019 1030   RDW 19.6 (H) 08/23/2019 1030   RDW 17.0 (H) 02/15/2019 1012   RDW 15.9 (H) 11/25/2014 1532   LYMPHSABS 1.2 08/13/2019 0750   LYMPHSABS 1.2 02/15/2019 1012   LYMPHSABS 1.2 11/25/2014 1532   MONOABS 1.2 (H) 08/13/2019 0750   MONOABS 0.4 11/25/2014 1532   EOSABS 0.6 (H) 08/13/2019 0750   EOSABS 0.2 02/15/2019 1012   BASOSABS 0.1 08/13/2019 0750   BASOSABS 0.1 02/15/2019 1012   BASOSABS 0.0 11/25/2014 1532    BMET    Component Value Date/Time   NA 139 08/23/2019 1030   NA 139 02/15/2019 1012   NA 141 11/25/2014 1532   K 4.2 08/23/2019 1030   K 2.7 (LL) 11/25/2014 1532   CL 113 (H) 08/23/2019 1030   CO2 20 08/23/2019 1030   CO2 22 11/25/2014 1532   GLUCOSE 83 08/23/2019 1030   GLUCOSE 113 11/25/2014 1532   BUN 18 08/23/2019 1030   BUN 11 02/15/2019 1012   BUN 16.4 11/25/2014 1532   CREATININE 1.46 (H) 08/23/2019 1030   CREATININE 1.36 (H) 07/12/2019 1122   CREATININE 1.4 (H) 11/25/2014 1532   CALCIUM 9.5 08/23/2019 1030   CALCIUM 10.4 11/25/2014 1532   GFRNONAA 45 (L)  08/13/2019 0750   GFRNONAA 41 (L) 07/12/2019 1122   GFRAA 53 (L) 08/13/2019 0750   GFRAA 48 (L) 07/12/2019 1122    BNP    Component Value Date/Time   BNP 153.2 (H) 08/07/2019 0030    ProBNP    Component Value Date/Time   PROBNP 190.0 (H) 08/23/2019 1030    Imaging: DG Chest 2 View  Result Date: 08/23/2019 CLINICAL DATA:  Follow-up hemothorax EXAM: CHEST - 2 VIEW COMPARISON:  08/13/2019 FINDINGS: Normal heart size, mediastinal contours, and pulmonary vascularity. Increased LEFT pleural effusion and basilar atelectasis. Mild underlying hyperinflation. Resolution of previously identified RIGHT pleural effusion. No pneumothorax. New radiopacities in RIGHT upper quadrant question medication tablets. IMPRESSION: Increased LEFT pleural effusion and basilar atelectasis. Electronically Signed   By: Lavonia Dana M.D.   On: 08/23/2019 10:02   DG Chest 2 View  Result Date: 08/13/2019 CLINICAL DATA:  Left lung pneumonia EXAM: CHEST - 2 VIEW COMPARISON:  08/13/2019 FINDINGS: Small bilateral pleural effusions. Hazy right upper lobe airspace disease. Left lower lobe airspace disease with improved aeration compared with the prior exam. No pneumothorax. Stable cardiomediastinal silhouette. No aggressive osseous lesion. IMPRESSION: Small bilateral pleural effusions. Hazy right upper lobe airspace disease concerning for pneumonia. Left lower lobe airspace disease with improved aeration compared with the prior exam. Electronically Signed   By: Kathreen Devoid   On: 08/13/2019 12:12   CT CHEST WO CONTRAST  Result Date: 08/08/2019 CLINICAL DATA:  Follow-up hemothorax EXAM: CT CHEST WITHOUT CONTRAST TECHNIQUE: Multidetector CT imaging of the chest was performed following the standard protocol without IV contrast. COMPARISON:  08/06/2019 CT, plain film from earlier in the same day. FINDINGS: Cardiovascular: Cardiac size is stable. Aortic calcifications and coronary calcifications are noted. Known pericardial effusion  is noted. The previously seen deviation of the heart to the right has resolved. Mediastinum/Nodes: Thoracic inlet is within normal limits. No definitive hilar or mediastinal adenopathy is noted. The previously seen mediastinal shift has resolved following drainage on the left. The esophagus is within normal limits as visualized. Lungs/Pleura: Pigtail catheter is noted in the left lung base anteriorly. The previously seen hemothorax has reduced significantly in size. There is improved aeration in the  left lung although persistent lower lobe consolidation remains. The right lung demonstrates some increased parenchymal opacity likely representing re-expansion edema related to the reduction of the mediastinal shift. Small right-sided pleural effusion is noted. Mild lower lobe consolidation is seen. Upper Abdomen: Visualized upper abdomen shows new ascites surrounding the liver. Musculoskeletal: No acute bony abnormality is noted. Previously seen left rib fractures are again identified. IMPRESSION: Significant reduction in left-sided hemothorax following catheter placement. Significant improved aeration of the left lung is noted although residual small effusion and lower lobe consolidation remains. The catheter is somewhat anteriorly oriented with respect to the residual fluid. No pneumothorax is noted. New patchy airspace opacity in the right lung likely representing edema from re-expansion related to the reduction of the mediastinal shift. Small right-sided pleural effusion and lower lobe consolidation is noted new from the prior exam. New ascites in the upper abdomen adjacent to the liver. This may represent some third spacing of fluid related to changes following reduction of the left-sided hemothorax. Electronically Signed   By: Inez Catalina M.D.   On: 08/08/2019 22:52   CT ANGIO CHEST PE W OR WO CONTRAST  Result Date: 08/06/2019 CLINICAL DATA:  Shortness of breath and chest pain EXAM: CT ANGIOGRAPHY CHEST WITH  CONTRAST TECHNIQUE: Multidetector CT imaging of the chest was performed using the standard protocol during bolus administration of intravenous contrast. Multiplanar CT image reconstructions and MIPs were obtained to evaluate the vascular anatomy. CONTRAST:  188m OMNIPAQUE IOHEXOL 350 MG/ML SOLN COMPARISON:  07/31/2019 FINDINGS: Cardiovascular: Thoracic aorta and its branches are well visualized. No significant atherosclerotic calcifications are seen. No aneurysmal dilatation is noted. Coronary calcifications are seen. No cardiac enlargement is noted. The heart is deviated to the right related to large left pleural effusion. The pulmonary artery shows a normal branching pattern without definitive filling defect to suggest pulmonary embolism. The peripheral branches are somewhat limited in opacification due to timing of the contrast bolus. Mediastinum/Nodes: Thoracic inlet is within normal limits. No hilar or mediastinal adenopathy is noted. Mild mediastinal shift to the right is seen related to the large left pleural effusion. Esophagus is within normal limits. Lungs/Pleura: Previously seen small effusion now occupies the entire left chest cavity with consolidation of the left lung. No pneumothorax is seen. Right lung shows no focal infiltrate or sizable effusion. Upper Abdomen: Hepatic and renal cysts are noted. Remainder of the upper abdomen is within normal limits. Musculoskeletal: Degenerative changes of the thoracic spine are noted. Multiple rib fractures are noted on the left involving the third, fourth, sixth, eighth and ninth ribs. No pneumothorax is noted. Review of the MIP images confirms the above findings. IMPRESSION: Multiple old rib fractures on the left. No evidence of pulmonary emboli. Large left pleural effusion with complete opacification of the left hemithorax. This is a significant increased from the most recent exam of 07/31/2019. Complete collapse of the left lung is noted. Electronically  Signed   By: MInez CatalinaM.D.   On: 08/06/2019 23:36   DG Chest Port 1 View  Result Date: 08/13/2019 CLINICAL DATA:  Left hemothorax EXAM: PORTABLE CHEST 1 VIEW COMPARISON:  08/12/2019 FINDINGS: Cardiac enlargement. Patchy airspace disease in the right lung more prominent in the apex and base. This suggests mild progression since prior study. Small right pleural effusion is unchanged. IMPRESSION: Patchy airspace disease in the right lung with small right pleural effusion demonstrating mild progression since prior study. Electronically Signed   By: WLucienne CapersM.D.   On: 08/13/2019 06:37  DG Chest Port 1 View  Result Date: 08/12/2019 CLINICAL DATA:  Acute respiratory failure. EXAM: PORTABLE CHEST 1 VIEW COMPARISON:  08/11/2019 and prior studies. FINDINGS: Hazy airspace lung opacity, in the right mid to upper lung, appears slightly improved from the previous day's exam. Lung base opacity, greatest on the right, is similar to the previous day's exam. No new lung abnormalities. No pneumothorax. Pigtail catheter projecting over the left hemidiaphragm is stable. IMPRESSION: 1. Slight improvement in hazy airspace opacity in the right mid to upper lung. 2. No other change from the previous day's exam. Persistent lung base opacity most evident on the right. Electronically Signed   By: Lajean Manes M.D.   On: 08/12/2019 08:53   DG CHEST PORT 1 VIEW  Result Date: 08/11/2019 CLINICAL DATA:  Pleural effusion.  Follow-up study. EXAM: PORTABLE CHEST 1 VIEW COMPARISON:  08/10/2019 and older exams.  CT dated 07/09/2019. FINDINGS: Cardiac silhouette is mildly enlarged. Interstitial hazy airspace opacity in the right upper lung is stable. There is more confluent opacity at the right lung base obscuring hemidiaphragm. Mild opacity is noted at the left lung base. Pigtail chest tube ir catheter projects over the left hemidiaphragm, stable. No pneumothorax. IMPRESSION: 1. No significant change from the previous day's  exam. 2. Interstitial and hazy airspace opacity in the right upper lobe consistent pneumonia. 3. More confluent lung base opacity, greater on the right, which may reflect pneumonia or atelectasis, associated with small pleural effusions as noted on the recent prior chest CT. Electronically Signed   By: Lajean Manes M.D.   On: 08/11/2019 10:14   DG CHEST PORT 1 VIEW  Result Date: 08/10/2019 CLINICAL DATA:  Pleural effusion. EXAM: PORTABLE CHEST 1 VIEW COMPARISON:  CT 08/08/2019.  Chest x-ray 08/08/2019, 08/07/2019. FINDINGS: Left chest tube in stable position. No pneumothorax. Cardiomegaly. Progressive bilateral pulmonary infiltrates/edema, most prominent right upper lung. Small left pleural effusion unchanged. Small right pleural effusion noted on today's exam. CHF could present this fashion. Bilateral pneumonia cannot be excluded. IMPRESSION: 1.  Left chest tube in stable position.  No pneumothorax. 2.  Cardiomegaly. 3. Progressive bilateral pulmonary infiltrates/edema, most prominent right upper lung. Small left pleural effusion unchanged. Small right pleural effusion noted on today's exam. CHF could present in this fashion. Bilateral pneumonia cannot be excluded. Electronically Signed   By: Marcello Moores  Register   On: 08/10/2019 05:49   DG CHEST PORT 1 VIEW  Result Date: 08/08/2019 CLINICAL DATA:  Previous pleural effusion EXAM: PORTABLE CHEST 1 VIEW COMPARISON:  August 07, 2019 FINDINGS: Drainage catheter at inferior left base region. There is left lower lobe consolidation with small left pleural effusion. Lungs elsewhere clear. Heart is mildly enlarged with mild pulmonary venous hypertension. No adenopathy. No pneumothorax. No bone lesions appreciable. IMPRESSION: Drainage catheter inferior left base noted. Small left pleural effusion. Left lower lobe consolidation concerning for atelectasis with potential superimposed pneumonia. Right lung clear. Stable cardiac prominence with a degree of pulmonary vascular  congestion. Electronically Signed   By: Lowella Grip III M.D.   On: 08/08/2019 08:26   DG CHEST PORT 1 VIEW  Result Date: 08/07/2019 CLINICAL DATA:  64 year old female with left side pain. Left chest tube placed for large left pleural effusion. EXAM: PORTABLE CHEST 1 VIEW COMPARISON:  Portable chest 1635 hours today and earlier. FINDINGS: Portable AP semi upright view at 1904 hours. Left pigtail pleural catheter appears stable, with subtotal drainage of the large left pleural effusion since yesterday. Substantially improved left lung ventilation, mild  residual veiling opacity at the left base. Stable cardiac size and mediastinal contours. Visualized tracheal air column is within normal limits. No pneumothorax or pulmonary edema. No new pulmonary opacity. Stable visualized osseous structures. IMPRESSION: 1. Stable left pleural catheter with subtotal drainage of the large left pleural effusion since yesterday. 2. No new cardiopulmonary abnormality. Electronically Signed   By: Genevie Ann M.D.   On: 08/07/2019 19:22   DG CHEST PORT 1 VIEW  Result Date: 08/07/2019 CLINICAL DATA:  Status post chest tube placement. EXAM: PORTABLE CHEST 1 VIEW COMPARISON:  Radiograph earlier this day.  Chest CT yesterday. FINDINGS: Pigtail catheter placement at the left lung base. Decreased size of left pleural effusion with improving aeration in the left hemithorax. No visualized pneumothorax. Moderate volume of pleural fluid persists with probable atelectasis throughout the left lung. No focal abnormality in the right lung. IMPRESSION: Pigtail catheter placement at the left lung base. Decreased size of left pleural effusion with improving aeration in the left hemithorax. No visualized pneumothorax. Small to moderate volume of left pleural fluid persists with atelectasis versus airspace disease throughout the left lung. Electronically Signed   By: Keith Rake M.D.   On: 08/07/2019 17:09   DG Chest Port 1 View  Result  Date: 08/07/2019 CLINICAL DATA:  Post thoracentesis EXAM: PORTABLE CHEST 1 VIEW COMPARISON:  Portable exam 1307 hours compared to 07/31/2019 chest radiograph and CT angio chest 08/06/2019 FINDINGS: Enlargement of cardiac silhouette with vascular congestion. Large LEFT pleural effusion though decreased since previous study. No pneumothorax. Persistent mild tracheal deviation to the RIGHT. RIGHT lung clear. Bones demineralized. IMPRESSION: Decreased LEFT pleural effusion post thoracentesis. No pneumothorax. Electronically Signed   By: Lavonia Dana M.D.   On: 08/07/2019 13:19   DG Chest Portable 1 View  Result Date: 07/31/2019 CLINICAL DATA:  Cough. EXAM: PORTABLE CHEST 1 VIEW COMPARISON:  04/16/2019. FINDINGS: Mediastinum hilar structures normal. Cardiomegaly with mild pulmonary venous congestion. Mild left base atelectasis/infiltrate with moderate left pleural effusion. IMPRESSION: 1.  Cardiomegaly with mild pulmonary venous congestion. 2. Mild left base atelectasis/infiltrate with moderate left pleural effusion. Electronically Signed   By: Marcello Moores  Register   On: 07/31/2019 13:39   ECHOCARDIOGRAM COMPLETE  Result Date: 08/07/2019    ECHOCARDIOGRAM REPORT   Patient Name:   CHENNEL OLIVOS Bidinger Date of Exam: 08/07/2019 Medical Rec #:  [id number]        Height:       68.0 in Accession #:    [id number]       Weight:       143.9 lb Date of Birth:  03-31-1955       BSA:          1.777 m Patient Age:    36 years         BP:           129/106 mmHg Patient Gender: F                HR:           99 bpm. Exam Location:  Inpatient Procedure: 2D Echo, Cardiac Doppler and Color Doppler Indications:     Acute Cornonary Syndrome I24.9  History:         Patient has prior history of Echocardiogram examinations, most                  recent 10/31/2018. CAD; Risk Factors:Hypertension and Former  Smoker. DOE. Elevated troponin. MR.  Sonographer:     Vickie Epley RDCS Referring Phys:  [place]annon Diagnosing Phys:  Dixie Dials MD IMPRESSIONS  1. Left ventricular ejection fraction, by estimation, is 40 to 45%. The left ventricle has mildly decreased function. The left ventricle demonstrates regional wall motion abnormalities (see scoring diagram/findings for description). Left ventricular diastolic parameters are consistent with Grade I diastolic dysfunction (impaired relaxation). There is mild hypokinesis of the left ventricular, basal inferior wall and inferoseptal wall.  2. Right ventricular systolic function is normal. The right ventricular size is normal.  3. Left atrial size was mildly dilated.  4. The pericardial effusion is circumferential. Large pleural effusion in the left lateral region.  5. The mitral valve is normal in structure. Mild mitral valve regurgitation.  6. The aortic valve is tricuspid. Aortic valve regurgitation is not visualized. Mild aortic valve sclerosis is present, with no evidence of aortic valve stenosis.  7. The inferior vena cava is dilated in size with <50% respiratory variability, suggesting right atrial pressure of 15 mmHg. FINDINGS  Left Ventricle: Left ventricular ejection fraction, by estimation, is 40 to 45%. The left ventricle has mildly decreased function. The left ventricle demonstrates regional wall motion abnormalities. Mild hypokinesis of the left ventricular, basal inferior wall and inferoseptal wall. The left ventricular internal cavity size was normal in size. There is no left ventricular hypertrophy. Left ventricular diastolic parameters are consistent with Grade I diastolic dysfunction (impaired relaxation).  LV Wall Scoring: The basal inferolateral segment and basal inferior segment are hypokinetic. The entire anterior wall, antero-lateral wall, mid and distal lateral wall, entire septum, entire apex, and mid and distal inferior wall are normal. Right Ventricle: The right ventricular size is normal. No increase in right ventricular wall thickness. Right ventricular systolic  function is normal. Left Atrium: Left atrial size was mildly dilated. Right Atrium: Right atrial size was normal in size. Pericardium: Trivial pericardial effusion is present. The pericardial effusion is circumferential. Mitral Valve: The mitral valve is normal in structure. Mild mitral valve regurgitation. Tricuspid Valve: The tricuspid valve is normal in structure. Tricuspid valve regurgitation is mild. Aortic Valve: The aortic valve is tricuspid. Aortic valve regurgitation is not visualized. Mild aortic valve sclerosis is present, with no evidence of aortic valve stenosis. Pulmonic Valve: The pulmonic valve was normal in structure. Pulmonic valve regurgitation is not visualized. Aorta: The aortic root is normal in size and structure. Venous: The inferior vena cava is dilated in size with less than 50% respiratory variability, suggesting right atrial pressure of 15 mmHg. IAS/Shunts: The interatrial septum was not assessed. Additional Comments: There is a large pleural effusion in the left lateral region.  LEFT VENTRICLE PLAX 2D LVOT diam:     1.90 cm      Diastology LV SV:         74           LV e' lateral:   13.40 cm/s LV SV Index:   42           LV E/e' lateral: 6.9 LVOT Area:     2.84 cm     LV e' medial:    10.10 cm/s                             LV E/e' medial:  9.2  LV Volumes (MOD) LV vol d, MOD A2C: 133.0 ml LV vol d, MOD A4C: 134.0 ml LV vol  s, MOD A2C: 65.4 ml LV vol s, MOD A4C: 80.2 ml LV SV MOD A2C:     67.6 ml LV SV MOD A4C:     134.0 ml LV SV MOD BP:      63.0 ml RIGHT VENTRICLE RV S prime:     15.40 cm/s TAPSE (M-mode): 2.0 cm LEFT ATRIUM             Index       RIGHT ATRIUM           Index LA Vol (A2C):   65.3 ml 36.75 ml/m RA Area:     11.50 cm LA Vol (A4C):   43.1 ml 24.26 ml/m RA Volume:   27.80 ml  15.65 ml/m LA Biplane Vol: 53.8 ml 30.28 ml/m  AORTIC VALVE LVOT Vmax:   169.00 cm/s LVOT Vmean:  105.000 cm/s LVOT VTI:    0.262 m  AORTA Ao Root diam: 2.80 cm MITRAL VALVE MV Area (PHT): 3.60  cm    SHUNTS MV Decel Time: 211 msec    Systemic VTI:  0.26 m MR Peak grad: 120.6 mmHg   Systemic Diam: 1.90 cm MR Vmax:      549.00 cm/s MV E velocity: 92.70 cm/s MV A velocity: 80.50 cm/s MV E/A ratio:  1.15 Dixie Dials MD Electronically signed by Dixie Dials MD Signature Date/Time: 08/07/2019/9:21:14 AM    Final      Assessment & Plan:   Hemothorax on left - Patient recently hospitalized 08/06/2019-08/14/2019, underwent left thoracentesis with 1.2L bloody pleural fluid removed. CVTS reviewed, no surgical intervention recommended. Chest tube discontinued 08/13/19. Left hemothorax resolved, presumably trauma related. Patient presented today for hospital follow-up, continues to have baseline shortness of breath and left sided discomfort. She denies trauma or recent ETOH use. She has hx advanced liver cirrhosis. CXR 08/23/2019 showed increased left sided pleural effusion. Discussed with Dr. Vaughan Browner, plan repeat thoracentesis. Recommend patient follow-up with GI sooner than scheduled mid-September      Martyn Ehrich, NP 08/24/2019

## 2019-08-23 NOTE — Patient Instructions (Signed)
Nice seeing you today Ms Rensch  CXR showed increased fluid left side, resolved right side pleural effusion  I will discuss with your pulmonary MD next steps. We will likely need to set you up for another thoracentesis to remove fluid. I want you to contact GI and see if you can get a sooner apt with them regarding liver cirrhosis and ascites. Please go to ED if breathing worsens or O2 <90%     Pleural Effusion Pleural effusion is an abnormal buildup of fluid in the layers of tissue between the lungs and the inside of the chest (pleural space) The two layers of tissue that line the lungs and the inside of the chest are called pleura. Usually, there is no air in the space between the pleura, only a thin layer of fluid. Some conditions can cause a large amount of fluid to build up, which can cause the lung to collapse if untreated. A pleural effusion is usually caused by another disease that requires treatment. What are the causes? Pleural effusion can be caused by:  Heart failure.  Certain infections, such as pneumonia or tuberculosis.  Cancer.  A blood clot in the lung (pulmonary embolism).  Complications from surgery, such as from open heart surgery.  Liver disease (cirrhosis).  Kidney disease. What are the signs or symptoms? In some cases, pleural effusion may cause no symptoms. If symptoms are present, they may include:  Shortness of breath, especially when lying down.  Chest pain. This may get worse when taking a deep breath.  Fever.  Dry, long-lasting (chronic) cough.  Hiccups.  Rapid breathing. An underlying condition that is causing the pleural effusion (such as heart failure, pneumonia, blood clots, tuberculosis, or cancer) may also cause other symptoms. How is this diagnosed? This condition may be diagnosed based on:  Your symptoms and medical history.  A physical exam.  A chest X-ray.  A procedure to use a needle to remove fluid from the pleural space  (thoracentesis). This fluid is tested.  Other imaging studies of the chest, such as ultrasound or CT scan. How is this treated? Depending on the cause of your condition, treatment may include:  Treating the underlying condition that is causing the effusion. When that condition improves, the effusion will also improve. Examples of treatment for underlying conditions include: ? Antibiotic medicines to treat an infection. ? Diuretics or other heart medicines to treat heart failure.  Thoracentesis.  Placing a thin flexible tube under your skin and into your chest to continuously drain the effusion (indwelling pleural catheter).  Surgery to remove the outer layer of tissue from the pleural space (decortication).  A procedure to put medicine into the chest cavity to seal the pleural space and prevent fluid buildup (pleurodesis).  Chemotherapy and radiation therapy, if you have cancerous (malignant) pleural effusion. These treatments are typically used to treat cancer. They kill certain cells in the body. Follow these instructions at home:  Take over-the-counter and prescription medicines only as told by your health care provider.  Ask your health care provider what activities are safe for you.  Keep track of how long you are able to do mild exercise (such as walking) before you get short of breath. Write down this information to share with your health care provider. Your ability to exercise should improve over time.  Do not use any products that contain nicotine or tobacco, such as cigarettes and e-cigarettes. If you need help quitting, ask your health care provider.  Keep all follow-up  visits as told by your health care provider. This is important. Contact a health care provider if:  The amount of time that you are able to do mild exercise: ? Decreases. ? Does not improve with time.  You have a fever. Get help right away if:  You are short of breath.  You develop chest  pain.  You develop a new cough. Summary  Pleural effusion is an abnormal buildup of fluid in the layers of tissue between the lungs and the inside of the chest.  Pleural effusion can have many causes, including heart failure, pulmonary embolism, infections, or cancer.  Symptoms of pleural effusion can include shortness of breath, chest pain, fever, long-lasting (chronic) cough, hiccups, or rapid breathing.  Diagnosis often involves making images of the chest (such as with ultrasound or X-ray) and removing fluid (thoracentesis) to send for testing.  Treatment for pleural effusion depends on what underlying condition is causing it. This information is not intended to replace advice given to you by your health care provider. Make sure you discuss any questions you have with your health care provider. Document Revised: 12/10/2016 Document Reviewed: 09/02/2016 Elsevier Patient Education  2020 [employer].

## 2019-08-24 ENCOUNTER — Encounter: Payer: Self-pay | Admitting: Primary Care

## 2019-08-24 ENCOUNTER — Telehealth: Payer: Self-pay | Admitting: Primary Care

## 2019-08-24 DIAGNOSIS — J942 Hemothorax: Secondary | ICD-10-CM | POA: Insufficient documentation

## 2019-08-24 NOTE — Telephone Encounter (Signed)
I would say first available with any of our pulmonologist. Dr. Vaughan Browner consulted her in hospital, she does not have an office pulmonologist.

## 2019-08-24 NOTE — Assessment & Plan Note (Addendum)
-   Patient recently hospitalized 08/06/2019-08/14/2019, underwent left thoracentesis with 1.2L bloody pleural fluid removed. CVTS reviewed, no surgical intervention recommended. Chest tube discontinued 08/13/19. Left hemothorax resolved, presumably trauma related. Patient presented today for hospital follow-up, continues to have baseline shortness of breath and left sided discomfort. She denies trauma or recent ETOH use. She has hx advanced liver cirrhosis. CXR 08/23/2019 showed increased left sided pleural effusion. Discussed with Dr. Vaughan Browner, plan repeat thoracentesis. Recommend patient follow-up with GI sooner than scheduled mid-September

## 2019-08-24 NOTE — Telephone Encounter (Signed)
Discussed with Dr. Vaughan Browner, we need to set patient up for repeat thoracentesis re: increased left pleural effusion/ hemothorax.

## 2019-08-24 NOTE — Telephone Encounter (Signed)
Called concerned sats of 93%  - advised if can get comfortable at rest sitting with sats in 90s then ok to stay home but otherwise nothing to offer over the phone > ER

## 2019-08-27 ENCOUNTER — Telehealth: Payer: Self-pay | Admitting: Primary Care

## 2019-08-27 ENCOUNTER — Telehealth: Payer: Self-pay | Admitting: Internal Medicine

## 2019-08-27 ENCOUNTER — Other Ambulatory Visit (HOSPITAL_COMMUNITY)
Admission: RE | Admit: 2019-08-27 | Discharge: 2019-08-27 | Disposition: A | Discharge status: Home / Self Care | Payer: Medicare (Managed Care) | Source: Ambulatory Visit | Attending: Internal Medicine | Admitting: Internal Medicine

## 2019-08-27 DIAGNOSIS — Z20822 Contact with and (suspected) exposure to covid-19: Secondary | ICD-10-CM | POA: Insufficient documentation

## 2019-08-27 DIAGNOSIS — Z01812 Encounter for preprocedural laboratory examination: Secondary | ICD-10-CM | POA: Insufficient documentation

## 2019-08-27 LAB — SARS CORONAVIRUS 2 (TAT 6-24 HRS): SARS Coronavirus 2: NEGATIVE

## 2019-08-27 NOTE — Telephone Encounter (Signed)
Spoke with patient.  She is scheduled for thoracentesis on 08/28/19 at 2 pm needs to arrive at 1:30pnm  Patient is going to get covid tested today at 1pm addressed given to patient.   Nothing further needed at this time Will route to Dr. Tamala Julian as Juluis Rainier

## 2019-08-27 NOTE — Telephone Encounter (Signed)
error 

## 2019-08-27 NOTE — Telephone Encounter (Signed)
SEE previous phone message. Dr. Melvyn Novas talked to patient on Friday with recommendations.   ATC patient unable to reach left message to call office back and ask for Dana Watkins

## 2019-08-28 ENCOUNTER — Ambulatory Visit (HOSPITAL_COMMUNITY)
Admission: RE | Admit: 2019-08-28 | Discharge: 2019-08-28 | Disposition: A | Discharge status: Home / Self Care | Payer: Medicare (Managed Care) | Attending: Internal Medicine | Admitting: Internal Medicine

## 2019-08-28 ENCOUNTER — Ambulatory Visit (HOSPITAL_COMMUNITY): Payer: Medicare (Managed Care)

## 2019-08-28 ENCOUNTER — Telehealth: Payer: Self-pay | Admitting: Primary Care

## 2019-08-28 ENCOUNTER — Encounter (HOSPITAL_COMMUNITY)
Admission: RE | Disposition: A | Discharge status: Home / Self Care | Payer: Self-pay | Source: Home / Self Care | Attending: Internal Medicine

## 2019-08-28 DIAGNOSIS — J90 Pleural effusion, not elsewhere classified: Secondary | ICD-10-CM

## 2019-08-28 DIAGNOSIS — Z87891 Personal history of nicotine dependence: Secondary | ICD-10-CM | POA: Insufficient documentation

## 2019-08-28 DIAGNOSIS — N183 Chronic kidney disease, stage 3 unspecified: Secondary | ICD-10-CM | POA: Insufficient documentation

## 2019-08-28 DIAGNOSIS — J942 Hemothorax: Secondary | ICD-10-CM

## 2019-08-28 DIAGNOSIS — K7460 Unspecified cirrhosis of liver: Secondary | ICD-10-CM | POA: Diagnosis not present

## 2019-08-28 DIAGNOSIS — I2510 Atherosclerotic heart disease of native coronary artery without angina pectoris: Secondary | ICD-10-CM | POA: Insufficient documentation

## 2019-08-28 DIAGNOSIS — I129 Hypertensive chronic kidney disease with stage 1 through stage 4 chronic kidney disease, or unspecified chronic kidney disease: Secondary | ICD-10-CM | POA: Insufficient documentation

## 2019-08-28 DIAGNOSIS — M1990 Unspecified osteoarthritis, unspecified site: Secondary | ICD-10-CM | POA: Insufficient documentation

## 2019-08-28 DIAGNOSIS — D696 Thrombocytopenia, unspecified: Secondary | ICD-10-CM | POA: Diagnosis not present

## 2019-08-28 DIAGNOSIS — Z9889 Other specified postprocedural states: Secondary | ICD-10-CM

## 2019-08-28 HISTORY — PX: THORACENTESIS: SHX235

## 2019-08-28 LAB — LACTATE DEHYDROGENASE, PLEURAL OR PERITONEAL FLUID: LD, Fluid: 100 U/L — ABNORMAL HIGH (ref 3–23)

## 2019-08-28 LAB — BODY FLUID CELL COUNT WITH DIFFERENTIAL
Eos, Fluid: 7 %
Lymphs, Fluid: 21 %
Monocyte-Macrophage-Serous Fluid: 68 % (ref 50–90)
Neutrophil Count, Fluid: 4 % (ref 0–25)
Total Nucleated Cell Count, Fluid: 1180 cu mm — ABNORMAL HIGH (ref 0–1000)

## 2019-08-28 LAB — PROTEIN, PLEURAL OR PERITONEAL FLUID: Total protein, fluid: <3 g/dL

## 2019-08-28 SURGERY — THORACENTESIS
Anesthesia: LOCAL

## 2019-08-28 NOTE — Telephone Encounter (Signed)
Pt scheduled with B. Walsh on 8/24 at 12pm. Nothing further needed.

## 2019-08-28 NOTE — Telephone Encounter (Signed)
Can you please have patient come back in 1 week with stat CXR prior to monitor pleural effusion. She had thoracentesis today with Laurey Arrow.

## 2019-08-28 NOTE — Procedures (Signed)
Thoracentesis  Procedure Note  JOLEEN STUCKERT  [id number]  1955/08/07  Date:08/28/19  Time:2:51 PM   Provider Performing:Pete E Kary Kos   Procedure: Thoracentesis with imaging guidance (43568)  Indication(s) Pleural Effusion  Consent Risks of the procedure as well as the alternatives and risks of each were explained to the patient and/or caregiver.  Consent for the procedure was obtained and is signed in the bedside chart  Anesthesia Topical only with 1% lidocaine    Time Out Verified patient identification, verified procedure, site/side was marked, verified correct patient position, special equipment/implants available, medications/allergies/relevant history reviewed, required imaging and test results available.   Sterile Technique Maximal sterile technique including full sterile barrier drape, hand hygiene, sterile gown, sterile gloves, mask, hair covering, sterile ultrasound probe cover (if used).  Procedure Description Ultrasound was used to identify appropriate pleural anatomy for placement and overlying skin marked.  Area of drainage cleaned and draped in sterile fashion. Lidocaine was used to anesthetize the skin and subcutaneous tissue.  780 cc's of orange  appearing fluid was drained from the left pleural space. Catheter then removed and bandaid applied to site.   Complications/Tolerance None; patient tolerated the procedure well. Chest X-ray is ordered to confirm no post-procedural complication.   EBL Minimal   Specimen(s) Pleural fluid   Erick Colace ACNP-BC [place] Pager # [phone] OR # [phone] if no answer

## 2019-08-28 NOTE — Interval H&P Note (Signed)
History and Physical Interval Note:  08/28/2019 1:36 PM  Dana Watkins  has presented today for surgery, with the diagnosis of pleural effusion.  The various methods of treatment have been discussed with the patient and family. After consideration of risks, benefits and other options for treatment, the patient has consented to  Procedure(s): THORACENTESIS (N/A) as a surgical intervention.  The patient's history has been reviewed, patient examined, no change in status, stable for surgery.  I have reviewed the patient's chart and labs.  Questions were answered to the patient's satisfaction.     Candee Furbish

## 2019-08-28 NOTE — Telephone Encounter (Signed)
Called and left message for patient to return call. She needs an appointment with NP Derl Barrow on 8/24, CXR ordered for 8/24 post thoracentesis for pleural effusion and hemothorax.

## 2019-08-29 ENCOUNTER — Telehealth: Payer: Self-pay | Admitting: Primary Care

## 2019-08-29 LAB — PH, BODY FLUID: pH, Body Fluid: 7.8

## 2019-08-29 LAB — CYTOLOGY - NON PAP

## 2019-08-29 MED ORDER — TRAMADOL HCL 50 MG PO TABS: ORAL_TABLET | ORAL | 0 refills | Status: DC

## 2019-08-29 NOTE — Telephone Encounter (Signed)
ATC pt- unable to leave cm due to mailbox being full.  Per initial message, patients preferred pharmacy is [facility] on [address].

## 2019-08-29 NOTE — Telephone Encounter (Signed)
Thanks, ill send RX there

## 2019-08-29 NOTE — Telephone Encounter (Signed)
Recommend tylenol 1,061m scheduled four times a day as long as she does not have any known liver disease. If does not improve we may consider short course of tramadol.

## 2019-08-29 NOTE — Telephone Encounter (Signed)
Called and spoke with patient she states that she has liver disease and can't take Tylenol. I do not see anything that indicates she has liver disease unless she is thinking of kidney disease and not taking Ibuprofen  Beth please advise

## 2019-08-29 NOTE — Telephone Encounter (Signed)
Contacted patient and let her know we sent in a prescription for pain medication.

## 2019-08-29 NOTE — Telephone Encounter (Signed)
Called and spoke with patient and she states that she is having constant pain where she had Thoracentesis done yesterday. She is scheduled with Beth on 8/24 at noon. States that she can't sleep due to the constant pain.   Beth please advise

## 2019-08-29 NOTE — Telephone Encounter (Signed)
Reviewed- she has CKD and cirrhosis. I will send in prescription for tramadol 1/2-1 tab every 8 hours for post-procedural pain.   Where does she want it sent

## 2019-08-30 ENCOUNTER — Encounter (HOSPITAL_COMMUNITY): Payer: Self-pay | Admitting: Internal Medicine

## 2019-08-31 LAB — BODY FLUID CULTURE: Culture: NO GROWTH

## 2019-08-31 LAB — CHOLESTEROL, BODY FLUID: Cholesterol, Fluid: 52 mg/dL

## 2019-09-04 ENCOUNTER — Ambulatory Visit (INDEPENDENT_AMBULATORY_CARE_PROVIDER_SITE_OTHER): Payer: Medicare (Managed Care)

## 2019-09-04 ENCOUNTER — Ambulatory Visit: Payer: Medicare (Managed Care) | Admitting: Primary Care

## 2019-09-04 ENCOUNTER — Other Ambulatory Visit: Payer: Self-pay

## 2019-09-04 ENCOUNTER — Encounter: Payer: Self-pay | Admitting: Primary Care

## 2019-09-04 VITALS — BP 130/76 | HR 96 | Temp 97.4°F | Ht 68.0 in | Wt 140.8 lb

## 2019-09-04 DIAGNOSIS — J942 Hemothorax: Secondary | ICD-10-CM | POA: Diagnosis not present

## 2019-09-04 NOTE — Patient Instructions (Addendum)
Pleasure seeing you today Ms Winget, glad you are feeling better  Orders: CXR today re: monitor pleural effusion  Follow-up: As needed with pulmonary if symptoms return

## 2019-09-04 NOTE — Assessment & Plan Note (Signed)
-   S/p thoracentesis on 08/28/19, 788m removed. CXR post procedure showed significant decrease in left side pleural effusion with small residual. She is feeling well. No shortness of breath or discomfort. Plan repeat CXR today to monitor for re-accumulation. Follow up with pulmonary as needed if symptoms return.

## 2019-09-04 NOTE — Progress Notes (Signed)
@Patient  ID: Dana Watkins, female    DOB: [birth year], 64 y.o.   MRN: [id number]  Chief Complaint  Patient presents with   Hospitalization Follow-up    Referring provider: Lawerance Cruel, MD  HPI:  64 year old female, former smoker.  Past medical history significant for coronary artery disease, arthritis, liver cirrhosis, chronic kidney disease, hypokalemia.    Patient hospitalized 08/06/2019-08/14/2019 for acute NSTEMI, acute on chronic respiratory failure and left hemothorax/right pleural effusion.  Patient originally presented to ED with left breast pain and shortness of breath.  EKG showed diffuse ST changes.  Labs revealed patient was anemic and she had an elevated troponin and BNP.  Chest x-ray showed vascular congestion.  Pulmonary consulted and patient underwent left thoracentesis with 1.2 L bloody pleural fluid removed.  CT chest 08/08/2019 showed significant reduction in left-sided hemothorax, new right sided pleural effusion and new ascites.  Repeat chest x-ray on 08/13/2019 showed small bilateral pleural effusions, left lower lobe airspace disease with improved aeration compared to prior exam.  No pneumothorax.  Chest tube discontinued on 08/13/2019.  Left hemothorax resolved, presumably trauma related.  No clear cause seen on imaging or history. CVTS reviewed case, no surgical intervention at this time. She did have anemia on admission and has hx ETOH abuse. She had atelectasis/consolidation left lower lobe on chest imaging, patient will need outpatient pulmonary follow-up to address this and repeat CT chest in 2 to 3 months.  Previous [facility] pulmonary encounters: 8/12/2021[name], NP Patient presents today for hospital follow-up left hemothorax.   Patient had stat chest x-ray today before visit which showed increased left pleural effusion and basilar atelectasis. Breathing is about the same as when she was discharged. She has dif time taking deep breath. Some discomfort left side. She  is on oral spirolactone. Patient has advanced cirrhosis need outpatient GI follow-up.  09/04/2019 - Interim hx S/p thoracentesis 08/28/19, removed 780cc fluid. Repeat CXR showed decreased left pleural effusion following thoracentesis with small residual. No pneumothorax. She is doing well today. She originally had some discomfort after procedure that resolved in 1-2 days. Breathing is stable. She stopped taking spirnolactone two days ago on her own. She reports some lower abdominal swelling. She plans to resume medication. Weight is down 2lbs. Denies fever, cough, shortness of breath or chest pain/tightness.    TESTING/ Chest x-ray 7/20 >> mild left base atelectasis with pleural effusion CT chest 08/06/2019- No PE, large effusion with left hemithorax opacification and collapse of left lung. Pleural Cytology 7/29 >> atypical cells, favor reactive mesothelial cells  CXR 08/23/2019-  Mild hyperinflation. Previous right pleural effusion resolved. No pneumothorax. Increased LEFT pleural effusion and basilar atelectasis.    Allergies  Allergen Reactions   Sulfa Antibiotics Nausea And Vomiting and Rash    Immunization History  Administered Date(s) Administered   PFIZER SARS-COV-2 Vaccination 03/23/2019, 04/13/2019    Past Medical History:  Diagnosis Date   Acute hypokalemia 11/26/2014   Arthritis 05/15/2015   Benign essential hypertension 05/15/2015   C. difficile colitis    CAD (coronary artery disease)    a. cath 04/2017: ""Diffuse, calcific CAD particularly in the distal RCA and proximal to mid LAD.  LAD disease is nonobstructive.  RCA disease is more severe but does not appear significant.  Given her lack of symptoms, would pursue medical therapy."   CKD (chronic kidney disease), stage III    Hypokalemia    Migraine headache 05/15/2015   Mild mitral regurgitation    Pancytopenia ([place])  Sepsis ([place]) 01/2017   Thrombocytopenia ([facility])    a. chronic thrombocytopenia ([facility] -  remotely saw hematology).   Vitamin D deficiency     Tobacco History: Social History   Tobacco Use  Smoking Status Former Smoker   Years: 2.00   Start date: 06/25/1968   Quit date: 05/16/2014   Years since quitting: 5.3  Smokeless Tobacco Never Used   Counseling given: Not Answered   Outpatient Medications Prior to Visit  Medication Sig Dispense Refill   Ascorbic Acid (VITAMIN C) 1000 MG tablet Take 1,000 mg by mouth in the morning and at bedtime. Chewable     butalbital-acetaminophen-caffeine (FIORICET) 50-325-40 MG tablet Take 1 tablet by mouth every 6 (six) hours as needed for headache or migraine.      folic acid (FOLVITE) 1 MG tablet Take 1 tablet (1 mg total) by mouth daily. 30 tablet 0   MAGNESIUM PO Take 400 mg by mouth daily.      Multiple Vitamin (MULTIVITAMIN WITH MINERALS) TABS tablet Take 1 tablet by mouth daily. (Patient taking differently: Take 1 tablet by mouth daily. Woman's/ Gummie) 30 tablet 0   ondansetron (ZOFRAN-ODT) 4 MG disintegrating tablet DISSOLVE 1 TABLET UNDER TONGUE AS NEEDED EVERY 8 HOURS (Patient taking differently: Take 4 mg by mouth every 8 (eight) hours as needed for nausea or vomiting. ) 20 tablet 0   pantoprazole (PROTONIX) 40 MG tablet Take 1 tablet (40 mg total) by mouth daily. 30 tablet 3   phytonadione (VITAMIN K) 5 MG tablet Take 1 tablet (5 mg total) by mouth daily. 30 tablet 1   potassium chloride SA (KLOR-CON) 20 MEQ tablet Take 1 tablet (20 mEq total) by mouth 2 (two) times daily. 60 tablet 6   spironolactone (ALDACTONE) 25 MG tablet Take 1 tablet (25 mg total) by mouth daily. (Patient taking differently: Take 25 mg by mouth daily as needed (swelling). ) 30 tablet 0   traMADol (ULTRAM) 50 MG tablet 1/2 -1 tab every 8 hours for post-procedural pain 15 tablet 0   No facility-administered medications prior to visit.    Review of Systems  Review of Systems  Constitutional: Negative.   Respiratory: Negative.     Cardiovascular: Negative.   Gastrointestinal: Positive for abdominal distention. Negative for abdominal pain.    Physical Exam  BP 130/76 (BP Location: Left Arm, Cuff Size: Normal)    Pulse 96    Temp (!) 97.4 F (36.3 C) (Temporal)    Ht 5' 8"  (1.727 m)    Wt 140 lb 12.8 oz (63.9 kg)    SpO2 98%    BMI 21.41 kg/m  Physical Exam Constitutional:      Appearance: Normal appearance.  HENT:     Head: Normocephalic and atraumatic.     Mouth/Throat:     Comments: Deferred d/t masking Cardiovascular:     Rate and Rhythm: Normal rate and regular rhythm.  Pulmonary:     Effort: Pulmonary effort is normal.     Breath sounds: Normal breath sounds.  Abdominal:     Palpations: Abdomen is soft. There is no mass.     Tenderness: There is no abdominal tenderness. There is no guarding.  Musculoskeletal:        General: Normal range of motion.  Skin:    General: Skin is warm and dry.  Neurological:     General: No focal deficit present.     Mental Status: She is alert and oriented to person, place, and time. Mental status is  at baseline.  Psychiatric:        Mood and Affect: Mood normal.        Behavior: Behavior normal.        Thought Content: Thought content normal.        Judgment: Judgment normal.      Lab Results:  CBC    Component Value Date/Time   WBC 7.0 08/23/2019 1030   RBC 3.15 (L) 08/23/2019 1030   HGB 9.3 (L) 08/23/2019 1030   HGB 9.3 (L) 07/12/2019 1122   HGB 7.9 (L) 02/15/2019 1012   HGB 12.6 11/25/2014 1532   HCT 28.7 (L) 08/23/2019 1030   HCT 23.2 (L) 02/15/2019 1012   HCT 38.5 11/25/2014 1532   PLT 110.0 (L) 08/23/2019 1030   PLT 55 (L) 07/12/2019 1122   PLT 82 (LL) 02/15/2019 1012   MCV 91.1 08/23/2019 1030   MCV 84 02/15/2019 1012   MCV 87.9 11/25/2014 1532   MCH 28.8 08/13/2019 0750   MCHC 32.3 08/23/2019 1030   RDW 19.6 (H) 08/23/2019 1030   RDW 17.0 (H) 02/15/2019 1012   RDW 15.9 (H) 11/25/2014 1532   LYMPHSABS 1.2 08/13/2019 0750   LYMPHSABS  1.2 02/15/2019 1012   LYMPHSABS 1.2 11/25/2014 1532   MONOABS 1.2 (H) 08/13/2019 0750   MONOABS 0.4 11/25/2014 1532   EOSABS 0.6 (H) 08/13/2019 0750   EOSABS 0.2 02/15/2019 1012   BASOSABS 0.1 08/13/2019 0750   BASOSABS 0.1 02/15/2019 1012   BASOSABS 0.0 11/25/2014 1532    BMET    Component Value Date/Time   NA 139 08/23/2019 1030   NA 139 02/15/2019 1012   NA 141 11/25/2014 1532   K 4.2 08/23/2019 1030   K 2.7 (LL) 11/25/2014 1532   CL 113 (H) 08/23/2019 1030   CO2 20 08/23/2019 1030   CO2 22 11/25/2014 1532   GLUCOSE 83 08/23/2019 1030   GLUCOSE 113 11/25/2014 1532   BUN 18 08/23/2019 1030   BUN 11 02/15/2019 1012   BUN 16.4 11/25/2014 1532   CREATININE 1.46 (H) 08/23/2019 1030   CREATININE 1.36 (H) 07/12/2019 1122   CREATININE 1.4 (H) 11/25/2014 1532   CALCIUM 9.5 08/23/2019 1030   CALCIUM 10.4 11/25/2014 1532   GFRNONAA 45 (L) 08/13/2019 0750   GFRNONAA 41 (L) 07/12/2019 1122   GFRAA 53 (L) 08/13/2019 0750   GFRAA 48 (L) 07/12/2019 1122    BNP    Component Value Date/Time   BNP 153.2 (H) 08/07/2019 0030    ProBNP    Component Value Date/Time   PROBNP 190.0 (H) 08/23/2019 1030    Imaging: DG Chest 2 View  Result Date: 08/23/2019 CLINICAL DATA:  Follow-up hemothorax EXAM: CHEST - 2 VIEW COMPARISON:  08/13/2019 FINDINGS: Normal heart size, mediastinal contours, and pulmonary vascularity. Increased LEFT pleural effusion and basilar atelectasis. Mild underlying hyperinflation. Resolution of previously identified RIGHT pleural effusion. No pneumothorax. New radiopacities in RIGHT upper quadrant question medication tablets. IMPRESSION: Increased LEFT pleural effusion and basilar atelectasis. Electronically Signed   By: Lavonia Dana M.D.   On: 08/23/2019 10:02   DG Chest 2 View  Result Date: 08/13/2019 CLINICAL DATA:  Left lung pneumonia EXAM: CHEST - 2 VIEW COMPARISON:  08/13/2019 FINDINGS: Small bilateral pleural effusions. Hazy right upper lobe airspace disease.  Left lower lobe airspace disease with improved aeration compared with the prior exam. No pneumothorax. Stable cardiomediastinal silhouette. No aggressive osseous lesion. IMPRESSION: Small bilateral pleural effusions. Hazy right upper lobe airspace disease concerning for pneumonia. Left  lower lobe airspace disease with improved aeration compared with the prior exam. Electronically Signed   By: Kathreen Devoid   On: 08/13/2019 12:12   CT CHEST WO CONTRAST  Result Date: 08/08/2019 CLINICAL DATA:  Follow-up hemothorax EXAM: CT CHEST WITHOUT CONTRAST TECHNIQUE: Multidetector CT imaging of the chest was performed following the standard protocol without IV contrast. COMPARISON:  08/06/2019 CT, plain film from earlier in the same day. FINDINGS: Cardiovascular: Cardiac size is stable. Aortic calcifications and coronary calcifications are noted. Known pericardial effusion is noted. The previously seen deviation of the heart to the right has resolved. Mediastinum/Nodes: Thoracic inlet is within normal limits. No definitive hilar or mediastinal adenopathy is noted. The previously seen mediastinal shift has resolved following drainage on the left. The esophagus is within normal limits as visualized. Lungs/Pleura: Pigtail catheter is noted in the left lung base anteriorly. The previously seen hemothorax has reduced significantly in size. There is improved aeration in the left lung although persistent lower lobe consolidation remains. The right lung demonstrates some increased parenchymal opacity likely representing re-expansion edema related to the reduction of the mediastinal shift. Small right-sided pleural effusion is noted. Mild lower lobe consolidation is seen. Upper Abdomen: Visualized upper abdomen shows new ascites surrounding the liver. Musculoskeletal: No acute bony abnormality is noted. Previously seen left rib fractures are again identified. IMPRESSION: Significant reduction in left-sided hemothorax following  catheter placement. Significant improved aeration of the left lung is noted although residual small effusion and lower lobe consolidation remains. The catheter is somewhat anteriorly oriented with respect to the residual fluid. No pneumothorax is noted. New patchy airspace opacity in the right lung likely representing edema from re-expansion related to the reduction of the mediastinal shift. Small right-sided pleural effusion and lower lobe consolidation is noted new from the prior exam. New ascites in the upper abdomen adjacent to the liver. This may represent some third spacing of fluid related to changes following reduction of the left-sided hemothorax. Electronically Signed   By: Inez Catalina M.D.   On: 08/08/2019 22:52   CT ANGIO CHEST PE W OR WO CONTRAST  Result Date: 08/06/2019 CLINICAL DATA:  Shortness of breath and chest pain EXAM: CT ANGIOGRAPHY CHEST WITH CONTRAST TECHNIQUE: Multidetector CT imaging of the chest was performed using the standard protocol during bolus administration of intravenous contrast. Multiplanar CT image reconstructions and MIPs were obtained to evaluate the vascular anatomy. CONTRAST:  170m OMNIPAQUE IOHEXOL 350 MG/ML SOLN COMPARISON:  07/31/2019 FINDINGS: Cardiovascular: Thoracic aorta and its branches are well visualized. No significant atherosclerotic calcifications are seen. No aneurysmal dilatation is noted. Coronary calcifications are seen. No cardiac enlargement is noted. The heart is deviated to the right related to large left pleural effusion. The pulmonary artery shows a normal branching pattern without definitive filling defect to suggest pulmonary embolism. The peripheral branches are somewhat limited in opacification due to timing of the contrast bolus. Mediastinum/Nodes: Thoracic inlet is within normal limits. No hilar or mediastinal adenopathy is noted. Mild mediastinal shift to the right is seen related to the large left pleural effusion. Esophagus is within  normal limits. Lungs/Pleura: Previously seen small effusion now occupies the entire left chest cavity with consolidation of the left lung. No pneumothorax is seen. Right lung shows no focal infiltrate or sizable effusion. Upper Abdomen: Hepatic and renal cysts are noted. Remainder of the upper abdomen is within normal limits. Musculoskeletal: Degenerative changes of the thoracic spine are noted. Multiple rib fractures are noted on the left involving the third,  fourth, sixth, eighth and ninth ribs. No pneumothorax is noted. Review of the MIP images confirms the above findings. IMPRESSION: Multiple old rib fractures on the left. No evidence of pulmonary emboli. Large left pleural effusion with complete opacification of the left hemithorax. This is a significant increased from the most recent exam of 07/31/2019. Complete collapse of the left lung is noted. Electronically Signed   By: Inez Catalina M.D.   On: 08/06/2019 23:36   DG CHEST PORT 1 VIEW  Result Date: 08/28/2019 CLINICAL DATA:  64 year old female with hemothorax in July. Status post thoracentesis today. Subsequent encounter. EXAM: PORTABLE CHEST 1 VIEW COMPARISON:  Chest radiographs 08/23/2019 and earlier. FINDINGS: Portable AP upright view at 1450 hours. No pneumothorax. Substantially decreased left pleural effusion from 08/23/2019, small residual. Mediastinal contours are within normal limits. Elsewhere when allowing for portable technique the lungs are clear. Visualized tracheal air column is within normal limits. No acute osseous abnormality identified. IMPRESSION: Decreased left pleural effusion following thoracentesis with small residual. No pneumothorax. Electronically Signed   By: Genevie Ann M.D.   On: 08/28/2019 14:56   DG Chest Port 1 View  Result Date: 08/13/2019 CLINICAL DATA:  Left hemothorax EXAM: PORTABLE CHEST 1 VIEW COMPARISON:  08/12/2019 FINDINGS: Cardiac enlargement. Patchy airspace disease in the right lung more prominent in the apex  and base. This suggests mild progression since prior study. Small right pleural effusion is unchanged. IMPRESSION: Patchy airspace disease in the right lung with small right pleural effusion demonstrating mild progression since prior study. Electronically Signed   By: Lucienne Capers M.D.   On: 08/13/2019 06:37   DG Chest Port 1 View  Result Date: 08/12/2019 CLINICAL DATA:  Acute respiratory failure. EXAM: PORTABLE CHEST 1 VIEW COMPARISON:  08/11/2019 and prior studies. FINDINGS: Hazy airspace lung opacity, in the right mid to upper lung, appears slightly improved from the previous day's exam. Lung base opacity, greatest on the right, is similar to the previous day's exam. No new lung abnormalities. No pneumothorax. Pigtail catheter projecting over the left hemidiaphragm is stable. IMPRESSION: 1. Slight improvement in hazy airspace opacity in the right mid to upper lung. 2. No other change from the previous day's exam. Persistent lung base opacity most evident on the right. Electronically Signed   By: Lajean Manes M.D.   On: 08/12/2019 08:53   DG CHEST PORT 1 VIEW  Result Date: 08/11/2019 CLINICAL DATA:  Pleural effusion.  Follow-up study. EXAM: PORTABLE CHEST 1 VIEW COMPARISON:  08/10/2019 and older exams.  CT dated 07/09/2019. FINDINGS: Cardiac silhouette is mildly enlarged. Interstitial hazy airspace opacity in the right upper lung is stable. There is more confluent opacity at the right lung base obscuring hemidiaphragm. Mild opacity is noted at the left lung base. Pigtail chest tube ir catheter projects over the left hemidiaphragm, stable. No pneumothorax. IMPRESSION: 1. No significant change from the previous day's exam. 2. Interstitial and hazy airspace opacity in the right upper lobe consistent pneumonia. 3. More confluent lung base opacity, greater on the right, which may reflect pneumonia or atelectasis, associated with small pleural effusions as noted on the recent prior chest CT. Electronically  Signed   By: Lajean Manes M.D.   On: 08/11/2019 10:14   DG CHEST PORT 1 VIEW  Result Date: 08/10/2019 CLINICAL DATA:  Pleural effusion. EXAM: PORTABLE CHEST 1 VIEW COMPARISON:  CT 08/08/2019.  Chest x-ray 08/08/2019, 08/07/2019. FINDINGS: Left chest tube in stable position. No pneumothorax. Cardiomegaly. Progressive bilateral pulmonary infiltrates/edema, most prominent right  upper lung. Small left pleural effusion unchanged. Small right pleural effusion noted on today's exam. CHF could present this fashion. Bilateral pneumonia cannot be excluded. IMPRESSION: 1.  Left chest tube in stable position.  No pneumothorax. 2.  Cardiomegaly. 3. Progressive bilateral pulmonary infiltrates/edema, most prominent right upper lung. Small left pleural effusion unchanged. Small right pleural effusion noted on today's exam. CHF could present in this fashion. Bilateral pneumonia cannot be excluded. Electronically Signed   By: Marcello Moores  Register   On: 08/10/2019 05:49   DG CHEST PORT 1 VIEW  Result Date: 08/08/2019 CLINICAL DATA:  Previous pleural effusion EXAM: PORTABLE CHEST 1 VIEW COMPARISON:  August 07, 2019 FINDINGS: Drainage catheter at inferior left base region. There is left lower lobe consolidation with small left pleural effusion. Lungs elsewhere clear. Heart is mildly enlarged with mild pulmonary venous hypertension. No adenopathy. No pneumothorax. No bone lesions appreciable. IMPRESSION: Drainage catheter inferior left base noted. Small left pleural effusion. Left lower lobe consolidation concerning for atelectasis with potential superimposed pneumonia. Right lung clear. Stable cardiac prominence with a degree of pulmonary vascular congestion. Electronically Signed   By: Lowella Grip III M.D.   On: 08/08/2019 08:26   DG CHEST PORT 1 VIEW  Result Date: 08/07/2019 CLINICAL DATA:  64 year old female with left side pain. Left chest tube placed for large left pleural effusion. EXAM: PORTABLE CHEST 1 VIEW  COMPARISON:  Portable chest 1635 hours today and earlier. FINDINGS: Portable AP semi upright view at 1904 hours. Left pigtail pleural catheter appears stable, with subtotal drainage of the large left pleural effusion since yesterday. Substantially improved left lung ventilation, mild residual veiling opacity at the left base. Stable cardiac size and mediastinal contours. Visualized tracheal air column is within normal limits. No pneumothorax or pulmonary edema. No new pulmonary opacity. Stable visualized osseous structures. IMPRESSION: 1. Stable left pleural catheter with subtotal drainage of the large left pleural effusion since yesterday. 2. No new cardiopulmonary abnormality. Electronically Signed   By: Genevie Ann M.D.   On: 08/07/2019 19:22   DG CHEST PORT 1 VIEW  Result Date: 08/07/2019 CLINICAL DATA:  Status post chest tube placement. EXAM: PORTABLE CHEST 1 VIEW COMPARISON:  Radiograph earlier this day.  Chest CT yesterday. FINDINGS: Pigtail catheter placement at the left lung base. Decreased size of left pleural effusion with improving aeration in the left hemithorax. No visualized pneumothorax. Moderate volume of pleural fluid persists with probable atelectasis throughout the left lung. No focal abnormality in the right lung. IMPRESSION: Pigtail catheter placement at the left lung base. Decreased size of left pleural effusion with improving aeration in the left hemithorax. No visualized pneumothorax. Small to moderate volume of left pleural fluid persists with atelectasis versus airspace disease throughout the left lung. Electronically Signed   By: Keith Rake M.D.   On: 08/07/2019 17:09   DG Chest Port 1 View  Result Date: 08/07/2019 CLINICAL DATA:  Post thoracentesis EXAM: PORTABLE CHEST 1 VIEW COMPARISON:  Portable exam 1307 hours compared to 07/31/2019 chest radiograph and CT angio chest 08/06/2019 FINDINGS: Enlargement of cardiac silhouette with vascular congestion. Large LEFT pleural effusion  though decreased since previous study. No pneumothorax. Persistent mild tracheal deviation to the RIGHT. RIGHT lung clear. Bones demineralized. IMPRESSION: Decreased LEFT pleural effusion post thoracentesis. No pneumothorax. Electronically Signed   By: Lavonia Dana M.D.   On: 08/07/2019 13:19   ECHOCARDIOGRAM COMPLETE  Result Date: 08/07/2019    ECHOCARDIOGRAM REPORT   Patient Name:   Dana Watkins Date  of Exam: 08/07/2019 Medical Rec #:  [id number]        Height:       68.0 in Accession #:    [id number]       Weight:       143.9 lb Date of Birth:  21-Jun-1955       BSA:          1.777 m Patient Age:    80 years         BP:           129/106 mmHg Patient Gender: F                HR:           99 bpm. Exam Location:  Inpatient Procedure: 2D Echo, Cardiac Doppler and Color Doppler Indications:     Acute Cornonary Syndrome I24.9  History:         Patient has prior history of Echocardiogram examinations, most                  recent 10/31/2018. CAD; Risk Factors:Hypertension and Former                  Smoker. DOE. Elevated troponin. MR.  Sonographer:     Vickie Epley RDCS Referring Phys:  [place] Diagnosing Phys: Dixie Dials MD IMPRESSIONS  1. Left ventricular ejection fraction, by estimation, is 40 to 45%. The left ventricle has mildly decreased function. The left ventricle demonstrates regional wall motion abnormalities (see scoring diagram/findings for description). Left ventricular diastolic parameters are consistent with Grade I diastolic dysfunction (impaired relaxation). There is mild hypokinesis of the left ventricular, basal inferior wall and inferoseptal wall.  2. Right ventricular systolic function is normal. The right ventricular size is normal.  3. Left atrial size was mildly dilated.  4. The pericardial effusion is circumferential. Large pleural effusion in the left lateral region.  5. The mitral valve is normal in structure. Mild mitral valve regurgitation.  6. The aortic valve is  tricuspid. Aortic valve regurgitation is not visualized. Mild aortic valve sclerosis is present, with no evidence of aortic valve stenosis.  7. The inferior vena cava is dilated in size with <50% respiratory variability, suggesting right atrial pressure of 15 mmHg. FINDINGS  Left Ventricle: Left ventricular ejection fraction, by estimation, is 40 to 45%. The left ventricle has mildly decreased function. The left ventricle demonstrates regional wall motion abnormalities. Mild hypokinesis of the left ventricular, basal inferior wall and inferoseptal wall. The left ventricular internal cavity size was normal in size. There is no left ventricular hypertrophy. Left ventricular diastolic parameters are consistent with Grade I diastolic dysfunction (impaired relaxation).  LV Wall Scoring: The basal inferolateral segment and basal inferior segment are hypokinetic. The entire anterior wall, antero-lateral wall, mid and distal lateral wall, entire septum, entire apex, and mid and distal inferior wall are normal. Right Ventricle: The right ventricular size is normal. No increase in right ventricular wall thickness. Right ventricular systolic function is normal. Left Atrium: Left atrial size was mildly dilated. Right Atrium: Right atrial size was normal in size. Pericardium: Trivial pericardial effusion is present. The pericardial effusion is circumferential. Mitral Valve: The mitral valve is normal in structure. Mild mitral valve regurgitation. Tricuspid Valve: The tricuspid valve is normal in structure. Tricuspid valve regurgitation is mild. Aortic Valve: The aortic valve is tricuspid. Aortic valve regurgitation is not visualized. Mild aortic valve sclerosis is present, with no evidence of aortic valve stenosis. Pulmonic  Valve: The pulmonic valve was normal in structure. Pulmonic valve regurgitation is not visualized. Aorta: The aortic root is normal in size and structure. Venous: The inferior vena cava is dilated in size  with less than 50% respiratory variability, suggesting right atrial pressure of 15 mmHg. IAS/Shunts: The interatrial septum was not assessed. Additional Comments: There is a large pleural effusion in the left lateral region.  LEFT VENTRICLE PLAX 2D LVOT diam:     1.90 cm      Diastology LV SV:         74           LV e' lateral:   13.40 cm/s LV SV Index:   42           LV E/e' lateral: 6.9 LVOT Area:     2.84 cm     LV e' medial:    10.10 cm/s                             LV E/e' medial:  9.2  LV Volumes (MOD) LV vol d, MOD A2C: 133.0 ml LV vol d, MOD A4C: 134.0 ml LV vol s, MOD A2C: 65.4 ml LV vol s, MOD A4C: 80.2 ml LV SV MOD A2C:     67.6 ml LV SV MOD A4C:     134.0 ml LV SV MOD BP:      63.0 ml RIGHT VENTRICLE RV S prime:     15.40 cm/s TAPSE (M-mode): 2.0 cm LEFT ATRIUM             Index       RIGHT ATRIUM           Index LA Vol (A2C):   65.3 ml 36.75 ml/m RA Area:     11.50 cm LA Vol (A4C):   43.1 ml 24.26 ml/m RA Volume:   27.80 ml  15.65 ml/m LA Biplane Vol: 53.8 ml 30.28 ml/m  AORTIC VALVE LVOT Vmax:   169.00 cm/s LVOT Vmean:  105.000 cm/s LVOT VTI:    0.262 m  AORTA Ao Root diam: 2.80 cm MITRAL VALVE MV Area (PHT): 3.60 cm    SHUNTS MV Decel Time: 211 msec    Systemic VTI:  0.26 m MR Peak grad: 120.6 mmHg   Systemic Diam: 1.90 cm MR Vmax:      549.00 cm/s MV E velocity: 92.70 cm/s MV A velocity: 80.50 cm/s MV E/A ratio:  1.15 Dixie Dials MD Electronically signed by Dixie Dials MD Signature Date/Time: 08/07/2019/9:21:14 AM    Final      Assessment & Plan:   Hemothorax on left - S/p thoracentesis on 08/28/19, 732m removed. CXR post procedure showed significant decrease in left side pleural effusion with small residual. She is feeling well. No shortness of breath or discomfort. Plan repeat CXR today to monitor for re-accumulation. Follow up with pulmonary as needed if symptoms return.      EMartyn Ehrich NP 09/04/2019

## 2019-09-05 NOTE — Progress Notes (Signed)
Please let patient know she has moderate left pleural effusion, mildly decreased in volume. This is from liver cirrhosis and heart failure. Recommend repeat CXR in 3-4 weeks. We need to get her establish with MD. Recommend first available with either Dr. Loanne Drilling, Carlis Abbott, [place] or [facility].

## 2019-09-05 NOTE — Progress Notes (Signed)
Patient clinically doing better. S/p thoracentesis 08/28/19, removed 780cc fluid. Repeat CXR showed decreased left pleural effusion following thoracentesis with small residual. CXR on 09/04/19 showed Recurrent moderate left pleural effusion, mild decrease in volume. Effusion likely secondary to liver cirrhosis, heart failure. She has supra therapeutic INR. Would not recommend repeat thoracentesis at this time as she is clinically doing better. Previous cultures have been negative. Cytology positive for atypical mesothelial cells. Will repeat CXR in 4 weeks and getting her in with our of our MDs to re-evaluate.

## 2019-09-11 ENCOUNTER — Other Ambulatory Visit: Payer: Self-pay

## 2019-09-11 ENCOUNTER — Encounter: Payer: Self-pay | Admitting: *Deleted

## 2019-09-11 ENCOUNTER — Inpatient Hospital Stay: Payer: Medicare (Managed Care)

## 2019-09-11 VITALS — BP 137/77 | HR 78 | Temp 97.8°F | Resp 18

## 2019-09-11 DIAGNOSIS — D631 Anemia in chronic kidney disease: Secondary | ICD-10-CM

## 2019-09-11 DIAGNOSIS — Z79899 Other long term (current) drug therapy: Secondary | ICD-10-CM | POA: Diagnosis not present

## 2019-09-11 DIAGNOSIS — N183 Chronic kidney disease, stage 3 unspecified: Secondary | ICD-10-CM | POA: Diagnosis not present

## 2019-09-11 DIAGNOSIS — K7460 Unspecified cirrhosis of liver: Secondary | ICD-10-CM | POA: Diagnosis not present

## 2019-09-11 DIAGNOSIS — D696 Thrombocytopenia, unspecified: Secondary | ICD-10-CM | POA: Diagnosis present

## 2019-09-11 LAB — CBC WITH DIFFERENTIAL (CANCER CENTER ONLY)
Abs Immature Granulocytes: 0.01 10*3/uL (ref 0.00–0.07)
Basophils Absolute: 0.1 10*3/uL (ref 0.0–0.1)
Basophils Relative: 1 %
Eosinophils Absolute: 0.4 10*3/uL (ref 0.0–0.5)
Eosinophils Relative: 8 %
HCT: 28.9 % — ABNORMAL LOW (ref 36.0–46.0)
Hemoglobin: 8.8 g/dL — ABNORMAL LOW (ref 12.0–15.0)
Immature Granulocytes: 0 %
Lymphocytes Relative: 28 %
Lymphs Abs: 1.4 10*3/uL (ref 0.7–4.0)
MCH: 28.9 pg (ref 26.0–34.0)
MCHC: 30.4 g/dL (ref 30.0–36.0)
MCV: 94.8 fL (ref 80.0–100.0)
Monocytes Absolute: 0.6 10*3/uL (ref 0.1–1.0)
Monocytes Relative: 12 %
Neutro Abs: 2.6 10*3/uL (ref 1.7–7.7)
Neutrophils Relative %: 51 %
Platelet Count: 82 10*3/uL — ABNORMAL LOW (ref 150–400)
RBC: 3.05 MIL/uL — ABNORMAL LOW (ref 3.87–5.11)
RDW: 21.2 % — ABNORMAL HIGH (ref 11.5–15.5)
WBC Count: 5.2 10*3/uL (ref 4.0–10.5)
nRBC: 0 % (ref 0.0–0.2)

## 2019-09-11 LAB — CMP (CANCER CENTER ONLY)
ALT: 37 U/L (ref 0–44)
AST: 79 U/L — ABNORMAL HIGH (ref 15–41)
Albumin: 2.4 g/dL — ABNORMAL LOW (ref 3.5–5.0)
Alkaline Phosphatase: 355 U/L — ABNORMAL HIGH (ref 38–126)
Anion gap: 4 — ABNORMAL LOW (ref 5–15)
BUN: 14 mg/dL (ref 8–23)
CO2: 20 mmol/L — ABNORMAL LOW (ref 22–32)
Calcium: 10.4 mg/dL — ABNORMAL HIGH (ref 8.9–10.3)
Chloride: 115 mmol/L — ABNORMAL HIGH (ref 98–111)
Creatinine: 1.37 mg/dL — ABNORMAL HIGH (ref 0.44–1.00)
GFR, Est AFR Am: 47 mL/min — ABNORMAL LOW (ref >60)
GFR, Estimated: 41 mL/min — ABNORMAL LOW (ref >60)
Glucose, Bld: 98 mg/dL (ref 70–99)
Potassium: 4.1 mmol/L (ref 3.5–5.1)
Sodium: 139 mmol/L (ref 135–145)
Total Bilirubin: 3.7 mg/dL (ref 0.3–1.2)
Total Protein: 6 g/dL — ABNORMAL LOW (ref 6.5–8.1)

## 2019-09-11 LAB — SAMPLE TO BLOOD BANK

## 2019-09-11 MED ORDER — DARBEPOETIN ALFA 500 MCG/ML IJ SOSY
500.0000 ug | PREFILLED_SYRINGE | Freq: Once | INTRAMUSCULAR | Status: AC
  Administered 2019-09-11: 500 ug via SUBCUTANEOUS

## 2019-09-11 MED ORDER — DARBEPOETIN ALFA 500 MCG/ML IJ SOSY
PREFILLED_SYRINGE | INTRAMUSCULAR | Status: AC
  Filled 2019-09-11: qty 1

## 2019-09-11 NOTE — Progress Notes (Signed)
CRITICAL VALUE ALERT  Critical Value:  Total Bili 3.7  Date & Time Notied:  8/31/21at 1200  Provider Notified: Nicholas Lose, MD  Orders Received/Actions taken: MD notified. No orders received at this time.

## 2019-09-11 NOTE — Patient Instructions (Signed)

## 2019-09-24 NOTE — Progress Notes (Signed)
Patient Care Team: Lawerance Cruel, MD as PCP - General (Family Medicine) [name], MD as PCP - Cardiology (Cardiology) Stephens Shire, MD as Referring Physician (Family Medicine)  DIAGNOSIS:    ICD-10-CM   1. Anemia due to stage 3 chronic kidney disease, unspecified whether stage 3a or 3b CKD  N18.30 CBC with Differential (Cancer Center Only)   D63.1 Ferritin    Reticulocytes ([facility])    Sample to Blood Bank    CHIEF COMPLIANT: Follow-up ofchronic anemia and thrombocytopenia  INTERVAL HISTORY: Dana Watkins is a 64 y.o. with above-mentioned history of chronic anemiasecondary to stage 3 chronic kidney diseaseand thrombocytopenia.She was admitted from 08/06/19-08/14/19 for acute MI, paracentesis for ascites due to cirrhosis bronchitis, significant anemia, elevated PT/INR, and received 4 units of PRBCs and 5 units of plasma. She presents to the clinic todayfor follow-up. She reports that every time she gets an injection with erythropoietin stimulating agents, she feels tired for several days.  She is wondering if she can hold off on receiving the shots and to see if her hemoglobin remained stable.   ALLERGIES:  is allergic to sulfa antibiotics.  MEDICATIONS:  Current Outpatient Medications  Medication Sig Dispense Refill  . Ascorbic Acid (VITAMIN C) 1000 MG tablet Take 1,000 mg by mouth in the morning and at bedtime. Chewable    . butalbital-acetaminophen-caffeine (FIORICET) 50-325-40 MG tablet Take 1 tablet by mouth every 6 (six) hours as needed for headache or migraine.     . folic acid (FOLVITE) 1 MG tablet Take 1 tablet (1 mg total) by mouth daily. 30 tablet 0  . MAGNESIUM PO Take 400 mg by mouth daily.     . Multiple Vitamin (MULTIVITAMIN WITH MINERALS) TABS tablet Take 1 tablet by mouth daily. (Patient taking differently: Take 1 tablet by mouth daily. Woman's/ Gummie) 30 tablet 0  . ondansetron (ZOFRAN-ODT) 4 MG disintegrating tablet DISSOLVE 1 TABLET UNDER  TONGUE AS NEEDED EVERY 8 HOURS (Patient taking differently: Take 4 mg by mouth every 8 (eight) hours as needed for nausea or vomiting. ) 20 tablet 0  . pantoprazole (PROTONIX) 40 MG tablet Take 1 tablet (40 mg total) by mouth daily. 30 tablet 3  . phytonadione (VITAMIN K) 5 MG tablet Take 1 tablet (5 mg total) by mouth daily. 30 tablet 1  . potassium chloride SA (KLOR-CON) 20 MEQ tablet Take 1 tablet (20 mEq total) by mouth 2 (two) times daily. 60 tablet 6  . spironolactone (ALDACTONE) 25 MG tablet Take 1 tablet (25 mg total) by mouth daily. (Patient taking differently: Take 25 mg by mouth daily as needed (swelling). ) 30 tablet 0  . traMADol (ULTRAM) 50 MG tablet 1/2 -1 tab every 8 hours for post-procedural pain 15 tablet 0   No current facility-administered medications for this visit.    PHYSICAL EXAMINATION: ECOG PERFORMANCE STATUS: 1 - Symptomatic but completely ambulatory  Vitals:   09/25/19 1218  BP: 135/79  Pulse: 72  Resp: 16  Temp: (!) 97 F (36.1 C)  SpO2: 100%   Filed Weights   09/25/19 1218  Weight: 148 lb 3.2 oz (67.2 kg)     LABORATORY DATA:  I have reviewed the data as listed CMP Latest Ref Rng & Units 09/25/2019 09/11/2019 08/23/2019  Glucose 70 - 99 mg/dL 88 98 83  BUN 8 - 23 mg/dL 14 14 18   Creatinine 0.44 - 1.00 mg/dL 1.50(H) 1.37(H) 1.46(H)  Sodium 135 - 145 mmol/L 138 139 139  Potassium 3.5 -  5.1 mmol/L 3.3(L) 4.1 4.2  Chloride 98 - 111 mmol/L 117(H) 115(H) 113(H)  CO2 22 - 32 mmol/L 18(L) 20(L) 20  Calcium 8.9 - 10.3 mg/dL 9.4 10.4(H) 9.5  Total Protein 6.5 - 8.1 g/dL 5.8(L) 6.0(L) 5.7(L)  Total Bilirubin 0.3 - 1.2 mg/dL 4.3(HH) 3.7(HH) 4.1(H)  Alkaline Phos 38 - 126 U/L 350(H) 355(H) 294(H)  AST 15 - 41 U/L 69(H) 79(H) 67(H)  ALT 0 - 44 U/L 30 37 34    Lab Results  Component Value Date   WBC 4.5 09/25/2019   HGB 8.7 (L) 09/25/2019   HCT 28.1 (L) 09/25/2019   MCV 90.6 09/25/2019   PLT 58 (L) 09/25/2019   NEUTROABS 2.2 09/25/2019    ASSESSMENT  & PLAN:  Anemia due to stage 3 chronic kidney disease Chronic normocytic anemia: GFR 36 Secondary to chronic kidney disease as well as secondary to cirrhosis of the liver.  Lab review 04/16/2019: Hemoglobin 8.1, MCV 96.3 platelets 57, erythropoietin 43.1, B12 1087, haptoglobin less than 10 (due to cirrhosis), total bilirubin 6.1, creatinine 1.3, LDH and folic acid normal  Plan: Aranesp injections at 500 mcg every 3 weeks started 07/12/2019 holding 09/25/2019  Liver cirrhosis: Patient continues to drink and I discussed with her that her child's class is 3 which translates into a medium life expectancy of 1 to 3 years.  Hospitalization: 08/06/2019-08/14/2019: Acute NSTEMI (no interventions), CHF, respiratory failure, CKD, cirrhosis, pancytopenia, right lung collapse, pleural effusion: Chest tube Received 2 units of PRBC x2 and FFP  Lab review: 09/25/2019: Hemoglobin 8.7, platelets 58 Patient tells me that she quit alcohol consumption. Unfortunately this may be a little too late because her liver has become cirrhotic.  Because she develops pain and discomfort and fatigue after she receives Aranesp injections, we decided to hold off on it and recheck her once a month with labs.  We will obtain type and cross for blood bank each month and transfuse if she drops less than 8 g. I believe her life expectancy is very low and that we can give supportive care with transfusions as needed.  Orders Placed This Encounter  Procedures  . CBC with Differential (Cancer Center Only)    Standing Status:   Standing    Number of Occurrences:   10    Standing Expiration Date:   09/24/2020  . Ferritin    Standing Status:   Standing    Number of Occurrences:   10    Standing Expiration Date:   09/24/2020  . Reticulocytes ([place])    Standing Status:   Standing    Number of Occurrences:   10    Standing Expiration Date:   09/24/2020  . Sample to Blood Bank    Standing Status:   Standing    Number of Occurrences:   10     Standing Expiration Date:   09/24/2020   The patient has a good understanding of the overall plan. she agrees with it. she will call with any problems that may develop before the next visit here.  Total time spent: 30 mins including face to face time and time spent for planning, charting and coordination of care  Nicholas Lose, MD 09/25/2019  I, Cloyde Reams Dorshimer, am acting as scribe for Dr. Nicholas Lose.  I have reviewed the above documentation for accuracy and completeness, and I agree with the above.

## 2019-09-25 ENCOUNTER — Inpatient Hospital Stay: Payer: Medicare (Managed Care) | Admitting: Hematology and Oncology

## 2019-09-25 ENCOUNTER — Other Ambulatory Visit: Payer: Self-pay

## 2019-09-25 ENCOUNTER — Inpatient Hospital Stay: Payer: Medicare (Managed Care) | Attending: Hematology and Oncology

## 2019-09-25 DIAGNOSIS — N183 Chronic kidney disease, stage 3 unspecified: Secondary | ICD-10-CM | POA: Insufficient documentation

## 2019-09-25 DIAGNOSIS — Z79899 Other long term (current) drug therapy: Secondary | ICD-10-CM | POA: Insufficient documentation

## 2019-09-25 DIAGNOSIS — K7460 Unspecified cirrhosis of liver: Secondary | ICD-10-CM | POA: Diagnosis not present

## 2019-09-25 DIAGNOSIS — D696 Thrombocytopenia, unspecified: Secondary | ICD-10-CM | POA: Insufficient documentation

## 2019-09-25 DIAGNOSIS — D61818 Other pancytopenia: Secondary | ICD-10-CM | POA: Insufficient documentation

## 2019-09-25 DIAGNOSIS — D631 Anemia in chronic kidney disease: Secondary | ICD-10-CM | POA: Diagnosis not present

## 2019-09-25 DIAGNOSIS — I509 Heart failure, unspecified: Secondary | ICD-10-CM | POA: Diagnosis not present

## 2019-09-25 DIAGNOSIS — I252 Old myocardial infarction: Secondary | ICD-10-CM | POA: Diagnosis not present

## 2019-09-25 LAB — SAMPLE TO BLOOD BANK

## 2019-09-25 LAB — CMP (CANCER CENTER ONLY)
ALT: 30 U/L (ref 0–44)
AST: 69 U/L — ABNORMAL HIGH (ref 15–41)
Albumin: 2.4 g/dL — ABNORMAL LOW (ref 3.5–5.0)
Alkaline Phosphatase: 350 U/L — ABNORMAL HIGH (ref 38–126)
Anion gap: 3 — ABNORMAL LOW (ref 5–15)
BUN: 14 mg/dL (ref 8–23)
CO2: 18 mmol/L — ABNORMAL LOW (ref 22–32)
Calcium: 9.4 mg/dL (ref 8.9–10.3)
Chloride: 117 mmol/L — ABNORMAL HIGH (ref 98–111)
Creatinine: 1.5 mg/dL — ABNORMAL HIGH (ref 0.44–1.00)
GFR, Est AFR Am: 43 mL/min — ABNORMAL LOW (ref >60)
GFR, Estimated: 37 mL/min — ABNORMAL LOW (ref >60)
Glucose, Bld: 88 mg/dL (ref 70–99)
Potassium: 3.3 mmol/L — ABNORMAL LOW (ref 3.5–5.1)
Sodium: 138 mmol/L (ref 135–145)
Total Bilirubin: 4.3 mg/dL (ref 0.3–1.2)
Total Protein: 5.8 g/dL — ABNORMAL LOW (ref 6.5–8.1)

## 2019-09-25 LAB — CBC WITH DIFFERENTIAL (CANCER CENTER ONLY)
Abs Immature Granulocytes: 0.01 10*3/uL (ref 0.00–0.07)
Basophils Absolute: 0 10*3/uL (ref 0.0–0.1)
Basophils Relative: 1 %
Eosinophils Absolute: 0.5 10*3/uL (ref 0.0–0.5)
Eosinophils Relative: 11 %
HCT: 28.1 % — ABNORMAL LOW (ref 36.0–46.0)
Hemoglobin: 8.7 g/dL — ABNORMAL LOW (ref 12.0–15.0)
Immature Granulocytes: 0 %
Lymphocytes Relative: 26 %
Lymphs Abs: 1.2 10*3/uL (ref 0.7–4.0)
MCH: 28.1 pg (ref 26.0–34.0)
MCHC: 31 g/dL (ref 30.0–36.0)
MCV: 90.6 fL (ref 80.0–100.0)
Monocytes Absolute: 0.6 10*3/uL (ref 0.1–1.0)
Monocytes Relative: 12 %
Neutro Abs: 2.2 10*3/uL (ref 1.7–7.7)
Neutrophils Relative %: 50 %
Platelet Count: 58 10*3/uL — ABNORMAL LOW (ref 150–400)
RBC: 3.1 MIL/uL — ABNORMAL LOW (ref 3.87–5.11)
RDW: 20.6 % — ABNORMAL HIGH (ref 11.5–15.5)
WBC Count: 4.5 10*3/uL (ref 4.0–10.5)
nRBC: 0 % (ref 0.0–0.2)

## 2019-09-25 NOTE — Assessment & Plan Note (Signed)
Chronic normocytic anemia: GFR 36 Secondary to chronic kidney disease as well as secondary to cirrhosis of the liver.  Lab review 04/16/2019: Hemoglobin 8.1, MCV 96.3 platelets 57, erythropoietin 43.1, B12 1087, haptoglobin less than 10 (due to cirrhosis), total bilirubin 6.1, creatinine 1.3, LDH and folic acid normal  Plan: Aranesp injections at 500 mcg every 3 weeks started 07/12/2019  Liver cirrhosis: Patient continues to drink and I discussed with her that her child's class is 3 which translates into a medium life expectancy of 1 to 3 years.  Hospitalization: 08/06/2019-08/14/2019: Acute NSTEMI (no interventions), CHF, respiratory failure, CKD, cirrhosis, pancytopenia, right lung collapse, pleural effusion: Chest tube Received 2 units of PRBC x2 and FFP  Lab review: 09/25/2019:

## 2019-10-04 ENCOUNTER — Telehealth: Payer: Self-pay | Admitting: Hematology and Oncology

## 2019-10-04 NOTE — Telephone Encounter (Signed)
Called pt per 9/23 sch msg - no answer. Left message for patient to call back if reschedule is still needed

## 2019-10-10 ENCOUNTER — Encounter: Payer: Self-pay | Admitting: Emergency Medicine

## 2019-10-10 ENCOUNTER — Other Ambulatory Visit: Payer: Self-pay

## 2019-10-10 ENCOUNTER — Ambulatory Visit: Payer: Medicare (Managed Care) | Admitting: Emergency Medicine

## 2019-10-10 ENCOUNTER — Ambulatory Visit (INDEPENDENT_AMBULATORY_CARE_PROVIDER_SITE_OTHER): Payer: Medicare (Managed Care)

## 2019-10-10 VITALS — BP 138/72 | HR 67 | Temp 97.6°F | Ht 68.0 in | Wt 146.5 lb

## 2019-10-10 DIAGNOSIS — J942 Hemothorax: Secondary | ICD-10-CM

## 2019-10-10 NOTE — Assessment & Plan Note (Signed)
Bloody left pleural effusion that was drained in late July, etiology unclear, question trauma.  A repeat thoracentesis 08/28/2019 showed some transudate of characteristics.  Suspect that the fluid is due to underlying cirrhosis.  Chest x-ray today with no reaccumulation but some residual effusion.  She needs a repeat CT scan of the chest with contrast to evaluate her parenchyma ensure no mass that may been hidden in her left lower lobe infiltrate/atelectasis when her original CT was performed.  Cytology has shown reactive mesothelial cells but no evidence of malignancy.  Plan to follow-up with her after the CT to review together.

## 2019-10-10 NOTE — Patient Instructions (Addendum)
We will plan to repeat your CT scan of the chest without contrast to evaluate your left long and left pleural space now that the fluid has decreased. Continue your low-salt diet and follow with Dr. Paulita Fujita as planned. Follow with Dr. Lamonte Sakai after your CT scan so that we can review the results together.

## 2019-10-10 NOTE — Addendum Note (Signed)
Addended by: Gavin Potters R on: 10/10/2019 10:15 AM   Modules accepted: Orders

## 2019-10-10 NOTE — Progress Notes (Signed)
Subjective:    Patient ID: Dana Watkins, female    DOB: Oct 01, 1955, 64 y.o.   MRN: [id number]  HPI 64 year old former smoker (5-10 pack years) with a history of CAD, recent non-STEMI, chronic renal failure, alcohol use, ITP, alcohol use.  She was hospitalized in late July with a non-STEMI, acute on chronic respiratory failure.  A left thoracentesis on that admission revealed 1.2 L of bloody pleural fluid.  She was also noted to have a right effusion.  No surgical intervention.  A repeat left thoracentesis with 780 cc of orange fluid was performed as an outpatient 08/28/2019.  Cytology showed atypical reactive mesothelial cells on both samples, no overt malignancy.  Probably transudate based on protein from second procedure. She tells me that she feels better, can still get some SOB w exertion and at rest. No CP. She has low energy. She is following with hematology for anemia, following with Dr Paulita Fujita for cirrhosis. Is due to see cardiology as well. She has stopped EtOH.   CT chest 08/08/2019 reviewed by me, shows improved left-sided pleural effusion with the pigtail catheter in place, better aeration left lung without any obvious mass.  There was some new right pleural effusion and some patchy right-sided airspace disease.  Also noted was ascites.  CXR 10/10/19 reviewed, shows some persistent blunting of the left costophrenic angle but without any significant reaccumulation of left pleural fluid, probably less than before.   Review of Systems Past Medical History:  Diagnosis Date   Acute hypokalemia 11/26/2014   Arthritis 05/15/2015   Benign essential hypertension 05/15/2015   C. difficile colitis    CAD (coronary artery disease)    a. cath 04/2017: ""Diffuse, calcific CAD particularly in the distal RCA and proximal to mid LAD.  LAD disease is nonobstructive.  RCA disease is more severe but does not appear significant.  Given her lack of symptoms, would pursue medical therapy."   CKD  (chronic kidney disease), stage III    Hypokalemia    Migraine headache 05/15/2015   Mild mitral regurgitation    Pancytopenia ([place])    Sepsis ([place]) 01/2017   Thrombocytopenia ([place])    a. chronic thrombocytopenia ([facility] - remotely saw hematology).   Vitamin D deficiency      Family History  Problem Relation Age of Onset   Pernicious anemia Maternal Grandfather    Heart attack Maternal Grandfather    Atrial fibrillation Mother    Supraventricular tachycardia Father    Heart disease Paternal Grandfather      Social History   Socioeconomic History   Marital status: Divorced    Spouse name: Not on file   Number of children: Not on file   Years of education: Not on file   Highest education level: Not on file  Occupational History   Not on file  Tobacco Use   Smoking status: Former Smoker    Years: 2.00    Start date: 06/25/1968    Quit date: 05/16/2014    Years since quitting: 5.4   Smokeless tobacco: Never Used  Vaping Use   Vaping Use: Never used  Substance and Sexual Activity   Alcohol use: Yes    Comment: occ   Drug use: No   Sexual activity: Not on file  Other Topics Concern   Not on file  Social History Narrative   Lives with husband  In home.  Has seasonal allergies.  Drinks coffee, 1 cup AM and 3 plus sweet tea.  Has 3 grown  kids.     Social Determinants of Health   Financial Resource Strain:    Difficulty of Paying Living Expenses: Not on file  Food Insecurity:    Worried About Charity fundraiser in the Last Year: Not on file   [employer] of Food in the Last Year: Not on file  Transportation Needs:    Lack of Transportation (Medical): Not on file   Lack of Transportation (Non-Medical): Not on file  Physical Activity:    Days of Exercise per Week: Not on file   Minutes of Exercise per Session: Not on file  Stress:    Feeling of Stress : Not on file  Social Connections:    Frequency of Communication with Friends and Family:  Not on file   Frequency of Social Gatherings with Friends and Family: Not on file   Attends Religious Services: Not on file   Active Member of Clubs or Organizations: Not on file   Attends Archivist Meetings: Not on file   Marital Status: Not on file  Intimate Partner Violence:    Fear of Current or Ex-Partner: Not on file   Emotionally Abused: Not on file   Physically Abused: Not on file   Sexually Abused: Not on file     Allergies  Allergen Reactions   Sulfa Antibiotics Nausea And Vomiting and Rash     Outpatient Medications Prior to Visit  Medication Sig Dispense Refill   Ascorbic Acid (VITAMIN C) 1000 MG tablet Take 1,000 mg by mouth in the morning and at bedtime. Chewable     butalbital-acetaminophen-caffeine (FIORICET) 50-325-40 MG tablet Take 1 tablet by mouth every 6 (six) hours as needed for headache or migraine.      folic acid (FOLVITE) 1 MG tablet Take 1 tablet (1 mg total) by mouth daily. 30 tablet 0   MAGNESIUM PO Take 400 mg by mouth daily.      Multiple Vitamin (MULTIVITAMIN WITH MINERALS) TABS tablet Take 1 tablet by mouth daily. (Patient taking differently: Take 1 tablet by mouth daily. Woman's/ Gummie) 30 tablet 0   ondansetron (ZOFRAN-ODT) 4 MG disintegrating tablet DISSOLVE 1 TABLET UNDER TONGUE AS NEEDED EVERY 8 HOURS (Patient taking differently: Take 4 mg by mouth every 8 (eight) hours as needed for nausea or vomiting. ) 20 tablet 0   pantoprazole (PROTONIX) 40 MG tablet Take 1 tablet (40 mg total) by mouth daily. 30 tablet 3   phytonadione (VITAMIN K) 5 MG tablet Take 1 tablet (5 mg total) by mouth daily. 30 tablet 1   potassium chloride SA (KLOR-CON) 20 MEQ tablet Take 1 tablet (20 mEq total) by mouth 2 (two) times daily. 60 tablet 6   spironolactone (ALDACTONE) 25 MG tablet Take 1 tablet (25 mg total) by mouth daily. (Patient taking differently: Take 25 mg by mouth daily as needed (swelling). ) 30 tablet 0   traMADol (ULTRAM) 50  MG tablet 1/2 -1 tab every 8 hours for post-procedural pain 15 tablet 0   No facility-administered medications prior to visit.  \     Objective:   Physical Exam Vitals:   10/10/19 0956  BP: 138/72  Pulse: 67  Temp: 97.6 F (36.4 C)  TempSrc: Temporal  SpO2: 99%  Weight: 146 lb 8 oz (66.5 kg)  Height: 5' 8"  (1.727 m)   Gen: Pleasant, well-nourished, in no distress,  normal affect  ENT: No lesions,  mouth clear,  oropharynx clear, no postnasal drip  Neck: No JVD, no stridor  Lungs: No use of accessory muscles,good air movement B, no crackles or wheezes.   Cardiovascular: RRR, heart sounds normal, no murmur or gallops, trace to 1+ ankle peripheral edema  Musculoskeletal: No deformities, no cyanosis or clubbing  Neuro: alert, awake, non focal  Skin: Warm, no lesions or rash      Assessment & Plan:  Hemothorax on left Bloody left pleural effusion that was drained in late July, etiology unclear, question trauma.  A repeat thoracentesis 08/28/2019 showed some transudate of characteristics.  Suspect that the fluid is due to underlying cirrhosis.  Chest x-ray today with no reaccumulation but some residual effusion.  She needs a repeat CT scan of the chest with contrast to evaluate her parenchyma ensure no mass that may been hidden in her left lower lobe infiltrate/atelectasis when her original CT was performed.  Cytology has shown reactive mesothelial cells but no evidence of malignancy.  Plan to follow-up with her after the CT to review together.  Baltazar Apo, MD, PhD 10/10/2019, 10:09 AM [facility] [phone] or if no answer [phone]

## 2019-10-12 NOTE — Progress Notes (Signed)
Cardiology Office Note   Date:  10/15/2019   ID:  Bexlee, Bergdoll 11/13/1955, MRN [id number]  PCP:  Lawerance Cruel, MD  Cardiologist:  Dr. Meda Coffee, MD    Chief Complaint  Patient presents with  . Follow-up  . Hospitalization Follow-up    History of Present Illness: Dana Watkins is a 64 y.o. female who presents for hospital follow up, seen for   Dana Watkins has a hx of HTN, chronic thrombocytopenia (ITP remotely saw hematology), migraines, vit D deficiency, arthritis, cardiomegaly, probable CKD III, hypokalemia, mitral regurgitation, CAD(per LHC 04/2017 with recommendations for medical treatment), and cirrhosis.  She was last seen by Dr. Meda Coffee in follow up and had several recent hospitalizations for various medical reasons. In follow up she reported to have favored medical management of her non-obstructive CAD including risk factor modifications.   She was then seen and followed by Dr. Doylene Canard 08/06/19 during a recent hospitalization for chest pain>> found to have right lung collapse with large left pleural effusion. Troponin levels were elevated at a peak of 234.  No further ischemic work-up was completed due to portal HTN, and pancytopenia. She had significant anemia and elevated PT/INR. She received PRBC 2 units x 2 and FFP. Pulmonary consult was obtained and initially 1.2L of left sided bloody pleural effusion was removed and next day post chest tube placement addition 3+ L of bloody pleural effusion was removed. Her PT/INR remained high even when she received daily vitamin K supplement.   She was seen by pulmonary with repeat CXR that was stable at that time. Cytology was negative for malignancy. Plan is for repeat CT   Today she reports that she had no chest pain during her hospital course. Likely hsT elevation secondary to demand ischemia in the setting of collapsed lung. Discussed potentially pursuing Lexiscan Myoview given mild CAD on LHC in 2019 however will defer  final decision to primary cardiologist. As above, she is not necessarily a candidate for invasive LHC and in the absence of CV symptoms, minimally elevated hsT and co-morbid conditions, would prefer to defer at this time. If symptoms arise, would consider further workup.   Past Medical History:  Diagnosis Date  . Acute hypokalemia 11/26/2014  . Arthritis 05/15/2015  . Benign essential hypertension 05/15/2015  . C. difficile colitis   . CAD (coronary artery disease)    a. cath 04/2017: ""Diffuse, calcific CAD particularly in the distal RCA and proximal to mid LAD.  LAD disease is nonobstructive.  RCA disease is more severe but does not appear significant.  Given her lack of symptoms, would pursue medical therapy."  . CKD (chronic kidney disease), stage III ([place])   . Hypokalemia   . Migraine headache 05/15/2015  . Mild mitral regurgitation   . Pancytopenia ([place])   . Sepsis ([place]) 01/2017  . Thrombocytopenia ([place])    a. chronic thrombocytopenia (ITP - remotely saw hematology).  . Vitamin D deficiency     Past Surgical History:  Procedure Laterality Date  . CESAREAN SECTION     24 years ago  . ESOPHAGOGASTRODUODENOSCOPY (EGD) WITH PROPOFOL N/A 06/13/2019   Procedure: ESOPHAGOGASTRODUODENOSCOPY (EGD) WITH PROPOFOL;  Surgeon: Arta Silence, MD;  Location: [facility] ENDOSCOPY;  Service: Endoscopy;  Laterality: N/A;  administration of one bag of platelets   . LEFT HEART CATH AND CORONARY ANGIOGRAPHY N/A 05/12/2017   Procedure: LEFT HEART CATH AND CORONARY ANGIOGRAPHY;  Surgeon: [name] Booze, MD;  Location: [place] [facility];  Service: Cardiovascular;  Laterality: N/A;  . THORACENTESIS N/A 08/28/2019   Procedure: Mathews Robinsons;  Surgeon: Candee Furbish, MD;  Location: [facility] ENDOSCOPY;  Service: Pulmonary;  Laterality: N/A;  . tonsils and adneoids     as a child  . ULTRASOUND GUIDANCE FOR VASCULAR ACCESS  05/12/2017   Procedure: Ultrasound Guidance For Vascular Access;  Surgeon: [name] Booze, MD;   Location: [place] [facility];  Service: Cardiovascular;;     Current Outpatient Medications  Medication Sig Dispense Refill  . Ascorbic Acid (VITAMIN C) 1000 MG tablet Take 1,000 mg by mouth in the morning and at bedtime. Chewable    . butalbital-acetaminophen-caffeine (FIORICET) 50-325-40 MG tablet Take 1 tablet by mouth every 6 (six) hours as needed for headache or migraine.     . folic acid (FOLVITE) 1 MG tablet Take 1 tablet (1 mg total) by mouth daily. 30 tablet 0  . MAGNESIUM PO Take 400 mg by mouth daily.     . Multiple Vitamin (MULTIVITAMIN WITH MINERALS) TABS tablet Take 1 tablet by mouth daily. 30 tablet 0  . ondansetron (ZOFRAN-ODT) 4 MG disintegrating tablet DISSOLVE 1 TABLET UNDER TONGUE AS NEEDED EVERY 8 HOURS 20 tablet 0  . pantoprazole (PROTONIX) 40 MG tablet Take 40 mg by mouth as needed (for heartburn/acid reflux).    . potassium chloride SA (KLOR-CON) 20 MEQ tablet Take 1 tablet (20 mEq total) by mouth 2 (two) times daily. 60 tablet 6  . spironolactone (ALDACTONE) 25 MG tablet Take 25 mg by mouth in the morning and at bedtime.     No current facility-administered medications for this visit.    Allergies:   Sulfa antibiotics    Social History:  The patient  reports that she quit smoking about 5 years ago. She started smoking about 51 years ago. She quit after 2.00 years of use. She has never used smokeless tobacco. She reports current alcohol use. She reports that she does not use drugs.   Family History:  The patient's family history includes Atrial fibrillation in her mother; Heart attack in her maternal grandfather; Heart disease in her paternal grandfather; Pernicious anemia in her maternal grandfather; Supraventricular tachycardia in her father.    ROS:  Please see the history of present illness. Otherwise, review of systems are positive for none.   All other systems are reviewed and negative.    PHYSICAL EXAM: VS:  BP 112/60   Pulse 78   Ht 5' 8"  (1.727 m)    Wt 138 lb (62.6 kg)   SpO2 98%   BMI 20.98 kg/m  , BMI Body mass index is 20.98 kg/m.   General: Well developed, well nourished, NAD Neck: Negative for carotid bruits. No JVD Lungs:Clear to ausculation bilaterally. No wheezes, rales, or rhonchi. Breathing is unlabored. Cardiovascular: RRR with S1 S2. No murmurs Extremities: No edema. Radial pulses 2+ bilaterally Neuro: Alert and oriented. No focal deficits. No facial asymmetry. MAE spontaneously. Psych: Responds to questions appropriately with normal affect.    EKG:  EKG is not ordered today.   Recent Labs: 02/15/2019: TSH 1.140 02/20/2019: Magnesium 1.8 08/07/2019: B Natriuretic Peptide 153.2 08/23/2019: Pro B Natriuretic peptide (BNP) 190.0 09/25/2019: ALT 30; BUN 14; Creatinine 1.50; Hemoglobin 8.7; Platelet Count 58; Potassium 3.3; Sodium 138    Lipid Panel    Component Value Date/Time   CHOL 170 08/07/2019 1531   CHOL 184 07/13/2017 0944   TRIG 40 08/07/2019 1531   HDL 51 08/07/2019 1531   HDL 67 07/13/2017  0944   CHOLHDL 3.3 08/07/2019 1531   VLDL 8 08/07/2019 1531   LDLCALC 111 (H) 08/07/2019 1531   LDLCALC 94 07/13/2017 0944      Wt Readings from Last 3 Encounters:  10/15/19 138 lb (62.6 kg)  10/10/19 146 lb 8 oz (66.5 kg)  09/25/19 148 lb 3.2 oz (67.2 kg)     Other studies Reviewed: Additional studies/ records that were reviewed today include: Review of the above records demonstrates:  ECHO IMPRESSIONS 10/2018  1. Left ventricular ejection fraction, by visual estimation, is 60 to 65%. The left ventricle has normal function. Mildly increased left ventricular size. There is mildly increased left ventricular hypertrophy. The left ventricular hypertrophy involves  basal-septum walls. 2. Global right ventricle has normal systolic function.The right ventricular size is normal. No increase in right ventricular wall thickness. 3. Left atrial size was severely dilated. 4. Right atrial size was severely  dilated. 5. The mitral valve is normal in structure. Moderate mitral valve regurgitation. No evidence of mitral stenosis. 6. The tricuspid valve is normal in structure. Tricuspid valve regurgitation is mild. 7. The aortic valve is tricuspid Aortic valve regurgitation was not visualized by color flow Doppler. Mild aortic valve sclerosis without stenosis. 8. The pulmonic valve was normal in structure. Pulmonic valve regurgitation is trivial by color flow Doppler.   LEFT HEART CATH AND CORONARY ANGIOGRAPHY 05/2017  Conclusion    Dist RCA lesion is 60% stenosed.  Prox RCA to Mid RCA lesion is 30% stenosed.  Prox Cx lesion is 25% stenosed.  Prox LAD to Mid LAD lesion is 25% stenosed.  Mid LAD lesion is 40% stenosed.  LV end diastolic pressure is normal.  There is no aortic valve stenosis.  Diffuse, calcific CAD particularly in the distal RCA and proximal to mid LAD. LAD disease is nonobstructive. RCA disease is more severe but does not appear significant. Given her lack of symptoms, would pursue medical therapy.   Right radial spasm noted. If 6 Fr catheter was needed in the future, would recommend groin access.    ASSESSMENT AND PLAN:  1.  Mild nonobstructive CAD: -Per LHC in 2019 with nonobstructive CAD with recommendations for medical management and CV risk factor modification who presented to [facility] with profound shortness of breath and chest pain found to have a collapsed lung secondary to spontaneous hemothorax.  HST levels minimally elevated with a peak at 234.  Dr. Merrilee Jansky team was consulted during her hospitalization at which time no further ischemic work-up completed secondary to pancytopenia requiring multiple transfusions with FFP. -She has had no CV symptoms.  Initial plan was for possible myocardial stress test however given her comorbid conditions, she is not a likely candidate for further invasive testing in the absence of symptoms would favor watchful  waiting at this time. -We will defer final recommendation by her primary cardiologist, Dr. Meda Coffee. -Not on ASA due to above along with severe anemia -No statin due to elevated LFTs secondary to EtOH cirrhosis  2.  Moderate MR: -Noted on prior echocardiogram -No signs or symptoms of progression -Plan for follow-up echo at next office visit  3.  EtOH cirrhosis with coagulopathy: -Followed by outpatient GI  4. HTN: -Stable, 112/60 -Continue current regimen   5. Acute on chronic kidney disease: -Last creatinine, 1.5 -Follows with nephrology    Current medicines are reviewed at length with the patient today.  The patient does not have concerns regarding medicines.  The following changes have been made:  no change  Labs/ tests ordered today include: None  No orders of the defined types were placed in this encounter.   Disposition:   FU with Dr. Meda Coffee in 8 weeks  Signed, Kathyrn Drown, NP  10/15/2019 [facility] [address], [place], [place]  [address] Phone: [phone]; Fax: [phone]

## 2019-10-12 NOTE — Progress Notes (Signed)
Stable left pleural effusion, you saw 10/10/19

## 2019-10-15 ENCOUNTER — Encounter: Payer: Self-pay | Admitting: Cardiology

## 2019-10-15 ENCOUNTER — Ambulatory Visit (INDEPENDENT_AMBULATORY_CARE_PROVIDER_SITE_OTHER): Payer: Medicare (Managed Care) | Admitting: Cardiology

## 2019-10-15 ENCOUNTER — Other Ambulatory Visit: Payer: Self-pay

## 2019-10-15 VITALS — BP 112/60 | HR 78 | Ht 68.0 in | Wt 138.0 lb

## 2019-10-15 DIAGNOSIS — N179 Acute kidney failure, unspecified: Secondary | ICD-10-CM

## 2019-10-15 DIAGNOSIS — I2510 Atherosclerotic heart disease of native coronary artery without angina pectoris: Secondary | ICD-10-CM

## 2019-10-15 DIAGNOSIS — D696 Thrombocytopenia, unspecified: Secondary | ICD-10-CM

## 2019-10-15 DIAGNOSIS — I340 Nonrheumatic mitral (valve) insufficiency: Secondary | ICD-10-CM | POA: Diagnosis not present

## 2019-10-15 DIAGNOSIS — K7469 Other cirrhosis of liver: Secondary | ICD-10-CM | POA: Diagnosis not present

## 2019-10-15 NOTE — Patient Instructions (Signed)
Medication Instructions:   Your physician recommends that you continue on your current medications as directed. Please refer to the Current Medication list given to you today.  *If you need a refill on your cardiac medications before your next appointment, please call your pharmacy*   Lab Work: None  If you have labs (blood work) drawn today and your tests are completely normal, you will receive your results only by: Marland Kitchen MyChart Message (if you have MyChart) OR . A paper copy in the mail If you have any lab test that is abnormal or we need to change your treatment, we will call you to review the results.   Testing/Procedures:   We will be in touch with you to let you know what Dr Meda Coffee says about the stress test.   Follow-Up: At [facility], you and your health needs are our priority.  As part of our continuing mission to provide you with exceptional heart care, we have created designated Provider Care Teams.  These Care Teams include your primary Cardiologist (physician) and Advanced Practice Providers (APPs -  Physician Assistants and Nurse Practitioners) who all work together to provide you with the care you need, when you need it.     Your next appointment:   2 month(s)  The format for your next appointment:   In Person  Provider:   Ena Dawley, MD

## 2019-10-17 ENCOUNTER — Other Ambulatory Visit: Payer: Self-pay

## 2019-10-17 ENCOUNTER — Ambulatory Visit (INDEPENDENT_AMBULATORY_CARE_PROVIDER_SITE_OTHER)
Admission: RE | Admit: 2019-10-17 | Discharge: 2019-10-17 | Disposition: A | Discharge status: Home / Self Care | Payer: Medicare (Managed Care) | Source: Ambulatory Visit | Attending: Emergency Medicine | Admitting: Emergency Medicine

## 2019-10-17 DIAGNOSIS — J942 Hemothorax: Secondary | ICD-10-CM

## 2019-10-22 NOTE — Progress Notes (Addendum)
Patient Care Team: Lawerance Cruel, MD as PCP - General (Family Medicine) [name], MD as PCP - Cardiology (Cardiology) Stephens Shire, MD as Referring Physician (Family Medicine)  DIAGNOSIS:    ICD-10-CM   1. Anemia due to stage 3 chronic kidney disease, unspecified whether stage 3a or 3b CKD (HCC)  N18.30    D63.1     CHIEF COMPLIANT: Follow-up ofchronic anemia and thrombocytopenia  INTERVAL HISTORY: Dana Watkins is a 64 y.o. with above-mentioned history of chronic anemiasecondary to stage 3 chronic kidney diseaseand thrombocytopenia. She presents to the clinic todayfor follow-up.  Her major complaint today is lack of energy.  She also has difficulty with sleeping because she naps throughout the day.  She thought she might need some blood today.  However her hemoglobin today is 9.2 and therefore she will not need blood transfusion.  ALLERGIES:  is allergic to sulfa antibiotics.  MEDICATIONS:  Current Outpatient Medications  Medication Sig Dispense Refill  . Ascorbic Acid (VITAMIN C) 1000 MG tablet Take 1,000 mg by mouth in the morning and at bedtime. Chewable    . butalbital-acetaminophen-caffeine (FIORICET) 50-325-40 MG tablet Take 1 tablet by mouth every 6 (six) hours as needed for headache or migraine.     . folic acid (FOLVITE) 1 MG tablet Take 1 tablet (1 mg total) by mouth daily. 30 tablet 0  . MAGNESIUM PO Take 400 mg by mouth daily.     . Multiple Vitamin (MULTIVITAMIN WITH MINERALS) TABS tablet Take 1 tablet by mouth daily. 30 tablet 0  . ondansetron (ZOFRAN-ODT) 4 MG disintegrating tablet DISSOLVE 1 TABLET UNDER TONGUE AS NEEDED EVERY 8 HOURS 20 tablet 0  . pantoprazole (PROTONIX) 40 MG tablet Take 40 mg by mouth as needed (for heartburn/acid reflux).    . potassium chloride SA (KLOR-CON) 20 MEQ tablet Take 1 tablet (20 mEq total) by mouth 2 (two) times daily. 60 tablet 6  . spironolactone (ALDACTONE) 25 MG tablet Take 25 mg by mouth in the morning  and at bedtime.     No current facility-administered medications for this visit.    PHYSICAL EXAMINATION: ECOG PERFORMANCE STATUS: 2 - Symptomatic, <50% confined to bed  Vitals:   10/23/19 1041  BP: 127/75  Pulse: 73  Resp: 18  Temp: (!) 97.4 F (36.3 C)  SpO2: 100%   Filed Weights   10/23/19 1041  Weight: 136 lb 3.2 oz (61.8 kg)    LABORATORY DATA:  I have reviewed the data as listed CMP Latest Ref Rng & Units 09/25/2019 09/11/2019 08/23/2019  Glucose 70 - 99 mg/dL 88 98 83  BUN 8 - 23 mg/dL 14 14 18   Creatinine 0.44 - 1.00 mg/dL 1.50(H) 1.37(H) 1.46(H)  Sodium 135 - 145 mmol/L 138 139 139  Potassium 3.5 - 5.1 mmol/L 3.3(L) 4.1 4.2  Chloride 98 - 111 mmol/L 117(H) 115(H) 113(H)  CO2 22 - 32 mmol/L 18(L) 20(L) 20  Calcium 8.9 - 10.3 mg/dL 9.4 10.4(H) 9.5  Total Protein 6.5 - 8.1 g/dL 5.8(L) 6.0(L) 5.7(L)  Total Bilirubin 0.3 - 1.2 mg/dL 4.3(HH) 3.7(HH) 4.1(H)  Alkaline Phos 38 - 126 U/L 350(H) 355(H) 294(H)  AST 15 - 41 U/L 69(H) 79(H) 67(H)  ALT 0 - 44 U/L 30 37 34    Lab Results  Component Value Date   WBC 4.8 10/23/2019   HGB 9.2 (L) 10/23/2019   HCT 29.1 (L) 10/23/2019   MCV 95.4 10/23/2019   PLT 86 (L) 10/23/2019   NEUTROABS  2.5 10/23/2019    ASSESSMENT & PLAN:  Anemia due to stage 3 chronic kidney disease Chronic normocytic anemia: GFR 36 Secondary to chronic kidney disease as well as secondary to cirrhosis of the liver.  Lab review  04/16/2019: Hemoglobin 8.1, MCV 96.3 platelets 57, erythropoietin 43.1, B12 1087, haptoglobin less than 10 (due to cirrhosis), total bilirubin 6.1, creatinine 1.3, LDH and folic acid normal 06/30/5091: Hemoglobin 8.7, platelets 58 10/23/2019: Hemoglobin 9.2  Current treatment plan: Aranesp injections at 500 mcg every 3 weeksstarted 07/12/2019 holding since 09/25/2019 (patient gets severe pain and discomfort and fatigue after she receives for Aranesp injections) With a hemoglobin today being 9.2, I recommended holding Aranesp  injections.  Liver cirrhosis: Child's class is 3 which translates into a medium life expectancy of 1 to 3 years.  Hospitalization: 08/06/2019-08/14/2019: Acute NSTEMI (no interventions), CHF, respiratory failure, CKD, cirrhosis, pancytopenia, right lung collapse, pleural effusion: Chest tube Received 2 units of PRBC x2 and FFP  Severe fatigue: Probably related to underlying liver failure and cardiac disease. I instructed her to take B12 supplement sublingual 1000 mcg daily.  Return to clinic in 1 month for labs and follow-up    No orders of the defined types were placed in this encounter.  The patient has a good understanding of the overall plan. she agrees with it. she will call with any problems that may develop before the next visit here.  Total time spent: 20 mins including face to face time and time spent for planning, charting and coordination of care  Nicholas Lose, MD 10/23/2019  I, Cloyde Reams Dorshimer, am acting as scribe for Dr. Nicholas Lose.  I have reviewed the above documentation for accuracy and completeness, and I agree with the above.

## 2019-10-23 ENCOUNTER — Other Ambulatory Visit: Payer: Self-pay | Admitting: *Deleted

## 2019-10-23 ENCOUNTER — Inpatient Hospital Stay: Payer: Medicare (Managed Care) | Attending: Hematology and Oncology

## 2019-10-23 ENCOUNTER — Other Ambulatory Visit: Payer: Self-pay

## 2019-10-23 ENCOUNTER — Inpatient Hospital Stay (HOSPITAL_BASED_OUTPATIENT_CLINIC_OR_DEPARTMENT_OTHER): Payer: Medicare (Managed Care) | Admitting: Hematology and Oncology

## 2019-10-23 DIAGNOSIS — K7460 Unspecified cirrhosis of liver: Secondary | ICD-10-CM | POA: Diagnosis not present

## 2019-10-23 DIAGNOSIS — Z79899 Other long term (current) drug therapy: Secondary | ICD-10-CM | POA: Diagnosis not present

## 2019-10-23 DIAGNOSIS — Z882 Allergy status to sulfonamides status: Secondary | ICD-10-CM | POA: Diagnosis not present

## 2019-10-23 DIAGNOSIS — J9690 Respiratory failure, unspecified, unspecified whether with hypoxia or hypercapnia: Secondary | ICD-10-CM | POA: Insufficient documentation

## 2019-10-23 DIAGNOSIS — I509 Heart failure, unspecified: Secondary | ICD-10-CM | POA: Insufficient documentation

## 2019-10-23 DIAGNOSIS — D631 Anemia in chronic kidney disease: Secondary | ICD-10-CM | POA: Insufficient documentation

## 2019-10-23 DIAGNOSIS — D61818 Other pancytopenia: Secondary | ICD-10-CM | POA: Insufficient documentation

## 2019-10-23 DIAGNOSIS — R5383 Other fatigue: Secondary | ICD-10-CM | POA: Diagnosis not present

## 2019-10-23 DIAGNOSIS — N183 Chronic kidney disease, stage 3 unspecified: Secondary | ICD-10-CM | POA: Insufficient documentation

## 2019-10-23 LAB — CMP (CANCER CENTER ONLY)
ALT: 36 U/L (ref 0–44)
AST: 69 U/L — ABNORMAL HIGH (ref 15–41)
Albumin: 2.6 g/dL — ABNORMAL LOW (ref 3.5–5.0)
Alkaline Phosphatase: 306 U/L — ABNORMAL HIGH (ref 38–126)
Anion gap: 4 — ABNORMAL LOW (ref 5–15)
BUN: 20 mg/dL (ref 8–23)
CO2: 19 mmol/L — ABNORMAL LOW (ref 22–32)
Calcium: 10.2 mg/dL (ref 8.9–10.3)
Chloride: 115 mmol/L — ABNORMAL HIGH (ref 98–111)
Creatinine: 1.87 mg/dL — ABNORMAL HIGH (ref 0.44–1.00)
GFR, Estimated: 28 mL/min — ABNORMAL LOW (ref >60)
Glucose, Bld: 122 mg/dL — ABNORMAL HIGH (ref 70–99)
Potassium: 3.5 mmol/L (ref 3.5–5.1)
Sodium: 138 mmol/L (ref 135–145)
Total Bilirubin: 5.9 mg/dL (ref 0.3–1.2)
Total Protein: 6.2 g/dL — ABNORMAL LOW (ref 6.5–8.1)

## 2019-10-23 LAB — RETICULOCYTES
Immature Retic Fract: 7 % (ref 2.3–15.9)
RBC.: 3.06 MIL/uL — ABNORMAL LOW (ref 3.87–5.11)
Retic Count, Absolute: 52.9 10*3/uL (ref 19.0–186.0)
Retic Ct Pct: 1.7 % (ref 0.4–3.1)

## 2019-10-23 LAB — SAMPLE TO BLOOD BANK

## 2019-10-23 LAB — CBC WITH DIFFERENTIAL (CANCER CENTER ONLY)
Abs Immature Granulocytes: 0.01 10*3/uL (ref 0.00–0.07)
Basophils Absolute: 0 10*3/uL (ref 0.0–0.1)
Basophils Relative: 1 %
Eosinophils Absolute: 0.4 10*3/uL (ref 0.0–0.5)
Eosinophils Relative: 8 %
HCT: 29.1 % — ABNORMAL LOW (ref 36.0–46.0)
Hemoglobin: 9.2 g/dL — ABNORMAL LOW (ref 12.0–15.0)
Immature Granulocytes: 0 %
Lymphocytes Relative: 30 %
Lymphs Abs: 1.5 10*3/uL (ref 0.7–4.0)
MCH: 30.2 pg (ref 26.0–34.0)
MCHC: 31.6 g/dL (ref 30.0–36.0)
MCV: 95.4 fL (ref 80.0–100.0)
Monocytes Absolute: 0.5 10*3/uL (ref 0.1–1.0)
Monocytes Relative: 10 %
Neutro Abs: 2.5 10*3/uL (ref 1.7–7.7)
Neutrophils Relative %: 51 %
Platelet Count: 86 10*3/uL — ABNORMAL LOW (ref 150–400)
RBC: 3.05 MIL/uL — ABNORMAL LOW (ref 3.87–5.11)
RDW: 20.5 % — ABNORMAL HIGH (ref 11.5–15.5)
WBC Count: 4.8 10*3/uL (ref 4.0–10.5)
nRBC: 0 % (ref 0.0–0.2)

## 2019-10-23 LAB — FERRITIN: Ferritin: 95 ng/mL (ref 11–307)

## 2019-10-23 MED ORDER — CYANOCOBALAMIN 1000 MCG SL SUBL: 1.0000 | SUBLINGUAL_TABLET | Freq: Every day | SUBLINGUAL | 3 refills | Status: DC

## 2019-10-23 NOTE — Progress Notes (Signed)
CRITICAL VALUE ALERT  Critical Value:  Total Bili 5.9  Date & Time Notied:  10/23/2019 at 1100  Provider Notified: Nicholas Lose, MD  Orders Received/Actions taken: MD notified and verbalized understanding, no orders received at this time.

## 2019-10-23 NOTE — Assessment & Plan Note (Signed)
Chronic normocytic anemia: GFR 36 Secondary to chronic kidney disease as well as secondary to cirrhosis of the liver.  Lab review  04/16/2019: Hemoglobin 8.1, MCV 96.3 platelets 57, erythropoietin 43.1, B12 1087, haptoglobin less than 10 (due to cirrhosis), total bilirubin 6.1, creatinine 1.3, LDH and folic acid normal 1/74/0992: Hemoglobin 8.7, platelets 58 10/23/2019:  Current treatment plan: Aranesp injections at 500 mcg every 3 weeksstarted 07/12/2019 holding 09/25/2019 (patient gets severe pain and discomfort and fatigue after she receives for Aranesp injections)  Liver cirrhosis: Child's class is 3 which translates into a medium life expectancy of 1 to 3 years.  Hospitalization: 08/06/2019-08/14/2019: Acute NSTEMI (no interventions), CHF, respiratory failure, CKD, cirrhosis, pancytopenia, right lung collapse, pleural effusion: Chest tube Received 2 units of PRBC x2 and FFP  Return to clinic every month for Aranesp injections.

## 2019-11-08 ENCOUNTER — Other Ambulatory Visit: Payer: Self-pay | Admitting: Physician Assistant

## 2019-11-08 DIAGNOSIS — K7460 Unspecified cirrhosis of liver: Secondary | ICD-10-CM

## 2019-11-19 ENCOUNTER — Ambulatory Visit
Admission: RE | Admit: 2019-11-19 | Discharge: 2019-11-19 | Disposition: A | Discharge status: Home / Self Care | Payer: Medicare (Managed Care) | Source: Ambulatory Visit | Attending: Physician Assistant | Admitting: Physician Assistant

## 2019-11-19 DIAGNOSIS — K7460 Unspecified cirrhosis of liver: Secondary | ICD-10-CM

## 2019-11-19 NOTE — Progress Notes (Signed)
Patient Care Team: Lawerance Cruel, MD as PCP - General (Family Medicine) [name], MD as PCP - Cardiology (Cardiology) Stephens Shire, MD as Referring Physician (Family Medicine)  DIAGNOSIS:    ICD-10-CM   1. Anemia due to stage 3 chronic kidney disease, unspecified whether stage 3a or 3b CKD (HCC)  N18.30 CBC with Differential (Cancer Center Only)   D63.1     CHIEF COMPLIANT: Follow-up ofchronic anemia and thrombocytopenia  INTERVAL HISTORY: Dana Watkins is a 64 y.o. with above-mentioned history of chronic anemiasecondary to stage 3 chronic kidney diseaseand thrombocytopenia.She presents to the clinic todayfor follow-up. She is full of energy today.  She feels that since she started B12 supplements she has a normal amount of energy to the point that she is having hard time sleeping.  ALLERGIES:  is allergic to sulfa antibiotics.  MEDICATIONS:  Current Outpatient Medications  Medication Sig Dispense Refill  . Ascorbic Acid (VITAMIN C) 1000 MG tablet Take 1,000 mg by mouth in the morning and at bedtime. Chewable    . butalbital-acetaminophen-caffeine (FIORICET) 50-325-40 MG tablet Take 1 tablet by mouth every 6 (six) hours as needed for headache or migraine.     . Cyanocobalamin 1000 MCG SUBL Place 1 tablet (1,000 mcg total) under the tongue daily. 90 tablet 3  . folic acid (FOLVITE) 1 MG tablet Take 1 tablet (1 mg total) by mouth daily. 30 tablet 0  . MAGNESIUM PO Take 400 mg by mouth daily.     . Multiple Vitamin (MULTIVITAMIN WITH MINERALS) TABS tablet Take 1 tablet by mouth daily. 30 tablet 0  . ondansetron (ZOFRAN-ODT) 4 MG disintegrating tablet DISSOLVE 1 TABLET UNDER TONGUE AS NEEDED EVERY 8 HOURS 20 tablet 0  . pantoprazole (PROTONIX) 40 MG tablet Take 40 mg by mouth as needed (for heartburn/acid reflux).    . potassium chloride SA (KLOR-CON) 20 MEQ tablet Take 1 tablet (20 mEq total) by mouth 2 (two) times daily. 60 tablet 6  . spironolactone  (ALDACTONE) 25 MG tablet Take 25 mg by mouth in the morning and at bedtime.     No current facility-administered medications for this visit.    PHYSICAL EXAMINATION: ECOG PERFORMANCE STATUS: 2 - Symptomatic, <50% confined to bed  Vitals:   11/20/19 1452  BP: 139/68  Pulse: 85  Resp: 18  Temp: 98.3 F (36.8 C)  SpO2: 100%   There were no vitals filed for this visit.  LABORATORY DATA:  I have reviewed the data as listed CMP Latest Ref Rng & Units 11/20/2019 10/23/2019 09/25/2019  Glucose 70 - 99 mg/dL 101(H) 122(H) 88  BUN 8 - 23 mg/dL 18 20 14   Creatinine 0.44 - 1.00 mg/dL 1.63(H) 1.87(H) 1.50(H)  Sodium 135 - 145 mmol/L 139 138 138  Potassium 3.5 - 5.1 mmol/L 3.6 3.5 3.3(L)  Chloride 98 - 111 mmol/L 115(H) 115(H) 117(H)  CO2 22 - 32 mmol/L 19(L) 19(L) 18(L)  Calcium 8.9 - 10.3 mg/dL 9.7 10.2 9.4  Total Protein 6.5 - 8.1 g/dL 6.1(L) 6.2(L) 5.8(L)  Total Bilirubin 0.3 - 1.2 mg/dL 5.0(HH) 5.9(HH) 4.3(HH)  Alkaline Phos 38 - 126 U/L 328(H) 306(H) 350(H)  AST 15 - 41 U/L 76(H) 69(H) 69(H)  ALT 0 - 44 U/L 38 36 30    Lab Results  Component Value Date   WBC 4.0 11/20/2019   HGB 8.5 (L) 11/20/2019   HCT 26.1 (L) 11/20/2019   MCV 97.8 11/20/2019   PLT 57 (L) 11/20/2019   NEUTROABS  2.3 11/20/2019    ASSESSMENT & PLAN:  Anemia due to stage 3 chronic kidney disease Chronic normocytic anemia: GFR 36 Secondary to chronic kidney disease as well as secondary to cirrhosis of the liver.  Lab review  04/16/2019: Hemoglobin 8.1, MCV 96.3 platelets 57, erythropoietin 43.1, B12 1087, haptoglobin less than 10 (due to cirrhosis), total bilirubin 6.1, creatinine 1.3, LDH and folic acid normal 6/83/7290:[id number] 8.7, platelets 58 10/23/2019: Hemoglobin 9.2  Current treatment plan: Aranesp injections at 500 mcg every 3 weeksstarted 7/1/2021holding since 09/25/2019 (patient got severe pain and discomfort and fatigue after she receives for Aranesp injections and she did not respond to  it)  Lab review: Today's hemoglobin is 8.5  Liver cirrhosis: Child's class is 3 which translates into a medium life expectancy of 1 to 3 years.  Hospitalization: 08/06/2019-08/14/2019: Acute NSTEMI (no interventions), CHF, respiratory failure, CKD, cirrhosis, pancytopenia, right lung collapse, pleural effusion: Chest tube Received 2 units of PRBC x2 and FFP  Severe fatigue: Markedly improved with B12 supplement sublingual 1000 mcg daily.  Return to clinic in 2 months for labs and follow-up     Orders Placed This Encounter  Procedures  . CBC with Differential (Cancer Center Only)    Standing Status:   Future    Standing Expiration Date:   11/19/2020   The patient has a good understanding of the overall plan. she agrees with it. she will call with any problems that may develop before the next visit here.  Total time spent: 20 mins including face to face time and time spent for planning, charting and coordination of care  Nicholas Lose, MD 11/20/2019  I, Cloyde Reams Dorshimer, am acting as scribe for Dr. Nicholas Lose.  I have reviewed the above documentation for accuracy and completeness, and I agree with the above.

## 2019-11-20 ENCOUNTER — Telehealth: Payer: Self-pay | Admitting: Hematology and Oncology

## 2019-11-20 ENCOUNTER — Inpatient Hospital Stay: Payer: Medicare (Managed Care)

## 2019-11-20 ENCOUNTER — Inpatient Hospital Stay: Payer: Medicare (Managed Care) | Attending: Hematology and Oncology | Admitting: Hematology and Oncology

## 2019-11-20 ENCOUNTER — Other Ambulatory Visit: Payer: Self-pay

## 2019-11-20 DIAGNOSIS — D696 Thrombocytopenia, unspecified: Secondary | ICD-10-CM | POA: Diagnosis not present

## 2019-11-20 DIAGNOSIS — I509 Heart failure, unspecified: Secondary | ICD-10-CM | POA: Diagnosis not present

## 2019-11-20 DIAGNOSIS — J9690 Respiratory failure, unspecified, unspecified whether with hypoxia or hypercapnia: Secondary | ICD-10-CM | POA: Diagnosis not present

## 2019-11-20 DIAGNOSIS — D631 Anemia in chronic kidney disease: Secondary | ICD-10-CM

## 2019-11-20 DIAGNOSIS — D61818 Other pancytopenia: Secondary | ICD-10-CM | POA: Insufficient documentation

## 2019-11-20 DIAGNOSIS — Z79899 Other long term (current) drug therapy: Secondary | ICD-10-CM | POA: Insufficient documentation

## 2019-11-20 DIAGNOSIS — N183 Chronic kidney disease, stage 3 unspecified: Secondary | ICD-10-CM | POA: Insufficient documentation

## 2019-11-20 DIAGNOSIS — K7460 Unspecified cirrhosis of liver: Secondary | ICD-10-CM | POA: Insufficient documentation

## 2019-11-20 DIAGNOSIS — Z882 Allergy status to sulfonamides status: Secondary | ICD-10-CM | POA: Diagnosis not present

## 2019-11-20 LAB — CBC WITH DIFFERENTIAL (CANCER CENTER ONLY)
Abs Immature Granulocytes: 0 10*3/uL (ref 0.00–0.07)
Basophils Absolute: 0 10*3/uL (ref 0.0–0.1)
Basophils Relative: 1 %
Eosinophils Absolute: 0.2 10*3/uL (ref 0.0–0.5)
Eosinophils Relative: 5 %
HCT: 26.1 % — ABNORMAL LOW (ref 36.0–46.0)
Hemoglobin: 8.5 g/dL — ABNORMAL LOW (ref 12.0–15.0)
Immature Granulocytes: 0 %
Lymphocytes Relative: 26 %
Lymphs Abs: 1 10*3/uL (ref 0.7–4.0)
MCH: 31.8 pg (ref 26.0–34.0)
MCHC: 32.6 g/dL (ref 30.0–36.0)
MCV: 97.8 fL (ref 80.0–100.0)
Monocytes Absolute: 0.4 10*3/uL (ref 0.1–1.0)
Monocytes Relative: 11 %
Neutro Abs: 2.3 10*3/uL (ref 1.7–7.7)
Neutrophils Relative %: 57 %
Platelet Count: 57 10*3/uL — ABNORMAL LOW (ref 150–400)
RBC: 2.67 MIL/uL — ABNORMAL LOW (ref 3.87–5.11)
RDW: 19.6 % — ABNORMAL HIGH (ref 11.5–15.5)
WBC Count: 4 10*3/uL (ref 4.0–10.5)
nRBC: 0 % (ref 0.0–0.2)

## 2019-11-20 LAB — CMP (CANCER CENTER ONLY)
ALT: 38 U/L (ref 0–44)
AST: 76 U/L — ABNORMAL HIGH (ref 15–41)
Albumin: 2.8 g/dL — ABNORMAL LOW (ref 3.5–5.0)
Alkaline Phosphatase: 328 U/L — ABNORMAL HIGH (ref 38–126)
Anion gap: 5 (ref 5–15)
BUN: 18 mg/dL (ref 8–23)
CO2: 19 mmol/L — ABNORMAL LOW (ref 22–32)
Calcium: 9.7 mg/dL (ref 8.9–10.3)
Chloride: 115 mmol/L — ABNORMAL HIGH (ref 98–111)
Creatinine: 1.63 mg/dL — ABNORMAL HIGH (ref 0.44–1.00)
GFR, Estimated: 35 mL/min — ABNORMAL LOW (ref >60)
Glucose, Bld: 101 mg/dL — ABNORMAL HIGH (ref 70–99)
Potassium: 3.6 mmol/L (ref 3.5–5.1)
Sodium: 139 mmol/L (ref 135–145)
Total Bilirubin: 5 mg/dL (ref 0.3–1.2)
Total Protein: 6.1 g/dL — ABNORMAL LOW (ref 6.5–8.1)

## 2019-11-20 LAB — SAMPLE TO BLOOD BANK

## 2019-11-20 LAB — FERRITIN: Ferritin: 51 ng/mL (ref 11–307)

## 2019-11-20 NOTE — Assessment & Plan Note (Signed)
Chronic normocytic anemia: GFR 36 Secondary to chronic kidney disease as well as secondary to cirrhosis of the liver.  Lab review  04/16/2019: Hemoglobin 8.1, MCV 96.3 platelets 57, erythropoietin 43.1, B12 1087, haptoglobin less than 10 (due to cirrhosis), total bilirubin 6.1, creatinine 1.3, LDH and folic acid normal 02/27/2444:XFQHKUVJDY 8.7, platelets 58 10/23/2019: Hemoglobin 9.2  Current treatment plan: Aranesp injections at 500 mcg every 3 weeksstarted 7/1/2021holding since 09/25/2019 (patient gets severe pain and discomfort and fatigue after she receives for Aranesp injections)  Lab review:  Liver cirrhosis: Child's class is 3 which translates into a medium life expectancy of 1 to 3 years.  Hospitalization: 08/06/2019-08/14/2019: Acute NSTEMI (no interventions), CHF, respiratory failure, CKD, cirrhosis, pancytopenia, right lung collapse, pleural effusion: Chest tube Received 2 units of PRBC x2 and FFP  Severe fatigue: Probably related to underlying liver failure and cardiac disease. Currently on B12 supplement sublingual 1000 mcg daily.  Return to clinic in 1 month for labs and follow-up

## 2019-11-20 NOTE — Telephone Encounter (Signed)
Scheduled appointment per 11/9 los. Spoke to patient who is aware of appointment date and time. Gave patient calendar print out.

## 2019-11-20 NOTE — Progress Notes (Signed)
CRITICAL VALUE ALERT  Critical Value:  Total Bili 5.0  Date & Time Notied:  11/20/19 at 1520  Provider Notified: Nicholas Lose, MD  Orders Received/Actions taken: MD notified and verbalized understanding.  No orders received at this time.

## 2019-12-25 ENCOUNTER — Encounter: Payer: Self-pay | Admitting: Cardiology

## 2019-12-25 ENCOUNTER — Other Ambulatory Visit: Payer: Self-pay

## 2019-12-25 ENCOUNTER — Ambulatory Visit (INDEPENDENT_AMBULATORY_CARE_PROVIDER_SITE_OTHER): Payer: Medicare (Managed Care) | Admitting: Cardiology

## 2019-12-25 VITALS — BP 142/80 | HR 87 | Ht 68.0 in | Wt 145.0 lb

## 2019-12-25 DIAGNOSIS — N184 Chronic kidney disease, stage 4 (severe): Secondary | ICD-10-CM

## 2019-12-25 DIAGNOSIS — I340 Nonrheumatic mitral (valve) insufficiency: Secondary | ICD-10-CM | POA: Diagnosis not present

## 2019-12-25 DIAGNOSIS — D631 Anemia in chronic kidney disease: Secondary | ICD-10-CM

## 2019-12-25 DIAGNOSIS — I519 Heart disease, unspecified: Secondary | ICD-10-CM

## 2019-12-25 DIAGNOSIS — I2510 Atherosclerotic heart disease of native coronary artery without angina pectoris: Secondary | ICD-10-CM | POA: Diagnosis not present

## 2019-12-25 DIAGNOSIS — I10 Essential (primary) hypertension: Secondary | ICD-10-CM

## 2019-12-25 MED ORDER — CARVEDILOL 3.125 MG PO TABS: 3.1250 mg | ORAL_TABLET | Freq: Two times a day (BID) | ORAL | 3 refills | Status: DC

## 2019-12-25 NOTE — Patient Instructions (Addendum)
Medication Instructions:   START TAKING CARVEDILOL 3.125 MG BY MOUTH TWICE DAILY  *If you need a refill on your cardiac medications before your next appointment, please call your pharmacy*   Follow-Up: At [facility], you and your health needs are our priority.  As part of our continuing mission to provide you with exceptional heart care, we have created designated Provider Care Teams.  These Care Teams include your primary Cardiologist (physician) and Advanced Practice Providers (APPs -  Physician Assistants and Nurse Practitioners) who all work together to provide you with the care you need, when you need it.  We recommend signing up for the patient portal called "MyChart".  Sign up information is provided on this After Visit Summary.  MyChart is used to connect with patients for Virtual Visits (Telemedicine).  Patients are able to view lab/test results, encounter notes, upcoming appointments, etc.  Non-urgent messages can be sent to your provider as well.   To learn more about what you can do with MyChart, go to [email or web address].    Your next appointment:   1 year(s)  The format for your next appointment:   In Person  Provider:   Ena Dawley, MD

## 2019-12-25 NOTE — Progress Notes (Signed)
Cardiology Office Note   Date:  12/25/2019   ID:  Dana Watkins, Dana Watkins 1955/04/02, MRN [id number]  PCP:  Lawerance Cruel, MD  Cardiologist:  Dr. Meda Coffee, MD    Reason for visit; 2 months follow up  History of Present Illness: Dana Watkins is a 64 y.o. female with hx of HTN, chronic thrombocytopenia (ITP remotely saw hematology), migraines, vit D deficiency, arthritis, cardiomegaly, probable CKD III, hypokalemia, mitral regurgitation, CAD(per LHC 04/2017 with recommendations for medical treatment), and cirrhosis. She was then seen and followed by Dr. Doylene Canard 08/06/19 during a recent hospitalization for chest pain>> found to have right lung collapse with large left pleural effusion. Troponin levels were elevated at a peak of 234.  No further ischemic work-up was completed due to portal HTN, and pancytopenia. She had significant anemia and elevated PT/INR. She received PRBC 2 units x 2 and FFP. Pulmonary consult was obtained and initially 1.2L of left sided bloody pleural effusion was removed and next day post chest tube placement addition 3+ L of bloody pleural effusion was removed. Her PT/INR remained high even when she received daily vitamin K supplement.  She was seen by pulmonary with repeat CXR that was stable at that time. Cytology was negative for malignancy. Plan is for repeat CT   Today she reports that she had no chest pain during her hospital course. Likely hsT elevation secondary to demand ischemia in the setting of collapsed lung. Discussed potentially pursuing Lexiscan Myoview given mild CAD on LHC in 2019 however will defer final decision to primary cardiologist. As above, she is not necessarily a candidate for invasive LHC and in the absence of CV symptoms, minimally elevated hsT and co-morbid conditions, would prefer to defer at this time. If symptoms arise, would consider further workup.   12/25/2019 -the patient is coming after 2 months, she has been doing great, denies  any chest pain or shortness of breath, she has been compliant with her medications denies any bleeding.  No palpitation dizziness or syncope.   Past Medical History:  Diagnosis Date  . Acute hypokalemia 11/26/2014  . Arthritis 05/15/2015  . Benign essential hypertension 05/15/2015  . C. difficile colitis   . CAD (coronary artery disease)    a. cath 04/2017: ""Diffuse, calcific CAD particularly in the distal RCA and proximal to mid LAD.  LAD disease is nonobstructive.  RCA disease is more severe but does not appear significant.  Given her lack of symptoms, would pursue medical therapy."  . CKD (chronic kidney disease), stage III ([place])   . Hypokalemia   . Migraine headache 05/15/2015  . Mild mitral regurgitation   . Pancytopenia ([place])   . Sepsis ([place]) 01/2017  . Thrombocytopenia ([place])    a. chronic thrombocytopenia (ITP - remotely saw hematology).  . Vitamin D deficiency     Past Surgical History:  Procedure Laterality Date  . CESAREAN SECTION     24 years ago  . ESOPHAGOGASTRODUODENOSCOPY (EGD) WITH PROPOFOL N/A 06/13/2019   Procedure: ESOPHAGOGASTRODUODENOSCOPY (EGD) WITH PROPOFOL;  Surgeon: Arta Silence, MD;  Location: [facility];  Service: Endoscopy;  Laterality: N/A;  administration of one bag of platelets   . LEFT HEART CATH AND CORONARY ANGIOGRAPHY N/A 05/12/2017   Procedure: LEFT HEART CATH AND CORONARY ANGIOGRAPHY;  Surgeon: [name] Booze, MD;  Location: [facility];  Service: Cardiovascular;  Laterality: N/A;  . THORACENTESIS N/A 08/28/2019   Procedure: THORACENTESIS;  Surgeon: Candee Furbish, MD;  Location: [facility]  ENDOSCOPY;  Service: Pulmonary;  Laterality: N/A;  . tonsils and adneoids     as a child  . ULTRASOUND GUIDANCE FOR VASCULAR ACCESS  05/12/2017   Procedure: Ultrasound Guidance For Vascular Access;  Surgeon: [name] Booze, MD;  Location: [facility];  Service: Cardiovascular;;     Current Outpatient Medications  Medication Sig Dispense Refill   . Ascorbic Acid (VITAMIN C) 1000 MG tablet Take 1,000 mg by mouth in the morning and at bedtime. Chewable    . B Complex Vitamins (B COMPLEX PO) Take by mouth.    . butalbital-acetaminophen-caffeine (FIORICET) 50-325-40 MG tablet Take 1 tablet by mouth every 6 (six) hours as needed for headache or migraine.     . Cyanocobalamin 1000 MCG SUBL Place 1 tablet (1,000 mcg total) under the tongue daily. 90 tablet 3  . MAGNESIUM PO Take 400 mg by mouth daily.     . Multiple Vitamin (MULTIVITAMIN WITH MINERALS) TABS tablet Take 1 tablet by mouth daily. 30 tablet 0  . ondansetron (ZOFRAN-ODT) 4 MG disintegrating tablet DISSOLVE 1 TABLET UNDER TONGUE AS NEEDED EVERY 8 HOURS 20 tablet 0  . pantoprazole (PROTONIX) 40 MG tablet Take 40 mg by mouth as needed (for heartburn/acid reflux).    . potassium chloride SA (KLOR-CON) 20 MEQ tablet Take 1 tablet (20 mEq total) by mouth 2 (two) times daily. 60 tablet 6  . spironolactone (ALDACTONE) 25 MG tablet Take 25 mg by mouth in the morning and at bedtime.     No current facility-administered medications for this visit.    Allergies:   Sulfa antibiotics    Social History:  The patient  reports that she quit smoking about 5 years ago. She started smoking about 51 years ago. She quit after 2.00 years of use. She has never used smokeless tobacco. She reports current alcohol use. She reports that she does not use drugs.   Family History:  The patient's family history includes Atrial fibrillation in her mother; Heart attack in her maternal grandfather; Heart disease in her paternal grandfather; Pernicious anemia in her maternal grandfather; Supraventricular tachycardia in her father.    ROS:  Please see the history of present illness. Otherwise, review of systems are positive for none.   All other systems are reviewed and negative.    PHYSICAL EXAM: VS:  BP (!) 142/80   Pulse 87   Ht 5' 8"  (1.727 m)   Wt 145 lb (65.8 kg)   SpO2 99%   BMI 22.05 kg/m  , BMI  Body mass index is 22.05 kg/m.   General: Well developed, well nourished, NAD Neck: Negative for carotid bruits. No JVD Lungs:Clear to ausculation bilaterally. No wheezes, rales, or rhonchi. Breathing is unlabored. Cardiovascular: RRR with S1 S2. No murmurs Extremities: No edema. Radial pulses 2+ bilaterally Neuro: Alert and oriented. No focal deficits. No facial asymmetry. MAE spontaneously. Psych: Responds to questions appropriately with normal affect.    EKG:  EKG is not ordered today.   Recent Labs: 02/15/2019: TSH 1.140 02/20/2019: Magnesium 1.8 08/07/2019: B Natriuretic Peptide 153.2 08/23/2019: Pro B Natriuretic peptide (BNP) 190.0 11/20/2019: ALT 38; BUN 18; Creatinine 1.63; Hemoglobin 8.5; Platelet Count 57; Potassium 3.6; Sodium 139    Lipid Panel    Component Value Date/Time   CHOL 170 08/07/2019 1531   CHOL 184 07/13/2017 0944   TRIG 40 08/07/2019 1531   HDL 51 08/07/2019 1531   HDL 67 07/13/2017 0944   CHOLHDL 3.3 08/07/2019 1531   VLDL  8 08/07/2019 1531   LDLCALC 111 (H) 08/07/2019 1531   LDLCALC 94 07/13/2017 0944      Wt Readings from Last 3 Encounters:  12/25/19 145 lb (65.8 kg)  10/23/19 136 lb 3.2 oz (61.8 kg)  10/15/19 138 lb (62.6 kg)     Other studies Reviewed: Additional studies/ records that were reviewed today include: Review of the above records demonstrates:   ECHO IMPRESSIONS 07/2019 1. Left ventricular ejection fraction, by estimation, is 40 to 45%. The  left ventricle has mildly decreased function. The left ventricle  demonstrates regional wall motion abnormalities (see scoring  diagram/findings for description). Left ventricular  diastolic parameters are consistent with Grade I diastolic dysfunction  (impaired relaxation). There is mild hypokinesis of the left ventricular,  basal inferior wall and inferoseptal wall.  2. Right ventricular systolic function is normal. The right ventricular  size is normal.  3. Left atrial size was  mildly dilated.  4. The pericardial effusion is circumferential. Large pleural effusion in  the left lateral region.  5. The mitral valve is normal in structure. Mild mitral valve  regurgitation.  6. The aortic valve is tricuspid. Aortic valve regurgitation is not  visualized. Mild aortic valve sclerosis is present, with no evidence of  aortic valve stenosis.  7. The inferior vena cava is dilated in size with <50% respiratory  variability, suggesting right atrial pressure of 15 mmHg.    LEFT HEART CATH AND CORONARY ANGIOGRAPHY 05/2017  Conclusion    Dist RCA lesion is 60% stenosed.  Prox RCA to Mid RCA lesion is 30% stenosed.  Prox Cx lesion is 25% stenosed.  Prox LAD to Mid LAD lesion is 25% stenosed.  Mid LAD lesion is 40% stenosed.  LV end diastolic pressure is normal.  There is no aortic valve stenosis.  Diffuse, calcific CAD particularly in the distal RCA and proximal to mid LAD. LAD disease is nonobstructive. RCA disease is more severe but does not appear significant. Given her lack of symptoms, would pursue medical therapy.   Right radial spasm noted. If 6 Fr catheter was needed in the future, would recommend groin access.    ASSESSMENT AND PLAN:  1.  Mild nonobstructive CAD: -Per LHC in 2019 with moderate nonobstructive CAD, she is asymptomatic and active. -Not on ASA due to above along with severe anemia, most recent hemoglobin 8.0 -No statin due to elevated LFTs secondary to EtOH cirrhosis  2.  Mitral regurgitation -Most recent echo showed only mild regurgitation  3.  EtOH cirrhosis with coagulopathy: -Followed by outpatient GI with chronically elevated LFTs  4. HTN: -Repeat blood pressure shows 130/68 mmHg.  5. Acute on chronic kidney disease: -Last creatinine, 1.87 from previous 1.5. -Follows with nephrology   6.  New LV dysfunction with LVEF 40 to 45% previously 60-65. -She has CKD stage IV, we are unable to start ACE or ARB, I  would start her on carvedilol 3.25 mg p.o. twice daily. -She is already on spironolactone 12.5 mg p.o. twice daily.  Current medicines are reviewed at length with the patient today.  The patient does not have concerns regarding medicines.  The following changes have been made:  no change  Labs/ tests ordered today include: None   Orders Placed This Encounter  Procedures  . EKG 12-Lead    Disposition:   FU with Dr. Meda Coffee in 8 weeks  Signed, Ena Dawley, MD  12/25/2019 2:27 PM    [address] [place],  [place]  [address] Phone: [phone]; Fax: [phone]

## 2020-01-20 NOTE — Progress Notes (Incomplete)
Patient Care Team: Lawerance Cruel, MD as PCP - General (Family Medicine) [name], MD as PCP - Cardiology (Cardiology) Stephens Shire, MD as Referring Physician (Family Medicine)  DIAGNOSIS: No diagnosis found.  CHIEF COMPLIANT: Follow-up ofchronic anemia and thrombocytopenia  INTERVAL HISTORY: Dana Watkins is a 65 y.o. with above-mentioned history of chronic anemiasecondary to stage 3 chronic kidney diseaseand thrombocytopenia.She presents to the clinic todayfor follow-up.   ALLERGIES:  is allergic to sulfa antibiotics.  MEDICATIONS:  Current Outpatient Medications  Medication Sig Dispense Refill  . Ascorbic Acid (VITAMIN C) 1000 MG tablet Take 1,000 mg by mouth in the morning and at bedtime. Chewable    . B Complex Vitamins (B COMPLEX PO) Take by mouth.    . butalbital-acetaminophen-caffeine (FIORICET) 50-325-40 MG tablet Take 1 tablet by mouth every 6 (six) hours as needed for headache or migraine.     . carvedilol (COREG) 3.125 MG tablet Take 1 tablet (3.125 mg total) by mouth 2 (two) times daily with a meal. 180 tablet 3  . Cyanocobalamin 1000 MCG SUBL Place 1 tablet (1,000 mcg total) under the tongue daily. 90 tablet 3  . MAGNESIUM PO Take 400 mg by mouth daily.     . Multiple Vitamin (MULTIVITAMIN WITH MINERALS) TABS tablet Take 1 tablet by mouth daily. 30 tablet 0  . ondansetron (ZOFRAN-ODT) 4 MG disintegrating tablet DISSOLVE 1 TABLET UNDER TONGUE AS NEEDED EVERY 8 HOURS 20 tablet 0  . pantoprazole (PROTONIX) 40 MG tablet Take 40 mg by mouth as needed (for heartburn/acid reflux).    . potassium chloride SA (KLOR-CON) 20 MEQ tablet Take 1 tablet (20 mEq total) by mouth 2 (two) times daily. 60 tablet 6  . spironolactone (ALDACTONE) 25 MG tablet Take 25 mg by mouth in the morning and at bedtime.     No current facility-administered medications for this visit.    PHYSICAL EXAMINATION: ECOG PERFORMANCE STATUS: {CHL ONC ECOG [phone]}  There  were no vitals filed for this visit. There were no vitals filed for this visit.  LABORATORY DATA:  I have reviewed the data as listed CMP Latest Ref Rng & Units 11/20/2019 10/23/2019 09/25/2019  Glucose 70 - 99 mg/dL 101(H) 122(H) 88  BUN 8 - 23 mg/dL 18 20 14   Creatinine 0.44 - 1.00 mg/dL 1.63(H) 1.87(H) 1.50(H)  Sodium 135 - 145 mmol/L 139 138 138  Potassium 3.5 - 5.1 mmol/L 3.6 3.5 3.3(L)  Chloride 98 - 111 mmol/L 115(H) 115(H) 117(H)  CO2 22 - 32 mmol/L 19(L) 19(L) 18(L)  Calcium 8.9 - 10.3 mg/dL 9.7 10.2 9.4  Total Protein 6.5 - 8.1 g/dL 6.1(L) 6.2(L) 5.8(L)  Total Bilirubin 0.3 - 1.2 mg/dL 5.0(HH) 5.9(HH) 4.3(HH)  Alkaline Phos 38 - 126 U/L 328(H) 306(H) 350(H)  AST 15 - 41 U/L 76(H) 69(H) 69(H)  ALT 0 - 44 U/L 38 36 30    Lab Results  Component Value Date   WBC 4.0 11/20/2019   HGB 8.5 (L) 11/20/2019   HCT 26.1 (L) 11/20/2019   MCV 97.8 11/20/2019   PLT 57 (L) 11/20/2019   NEUTROABS 2.3 11/20/2019    ASSESSMENT & PLAN:  No problem-specific Assessment & Plan notes found for this encounter.    No orders of the defined types were placed in this encounter.  The patient has a good understanding of the overall plan. she agrees with it. she will call with any problems that may develop before the next visit here.  Total time spent: ***  mins including face to face time and time spent for planning, charting and coordination of care  Nicholas Lose, MD 01/20/2020  I, Cloyde Reams Dorshimer, am acting as scribe for Dr. Nicholas Lose.  {insert scribe attestation}

## 2020-01-21 ENCOUNTER — Inpatient Hospital Stay: Payer: Medicare HMO | Admitting: Hematology and Oncology

## 2020-01-21 ENCOUNTER — Inpatient Hospital Stay: Payer: Medicare HMO

## 2020-01-21 NOTE — Assessment & Plan Note (Deleted)
Chronic normocytic anemia: GFR 36 Secondary to chronic kidney disease as well as secondary to cirrhosis of the liver.  Lab review  04/16/2019: Hemoglobin 8.1, MCV 96.3 platelets 57, erythropoietin 43.1, B12 1087, haptoglobin less than 10 (due to cirrhosis), total bilirubin 6.1, creatinine 1.3, LDH and folic acid normal 6/75/6125:[id number] 8.7, platelets 58 10/23/2019:Hemoglobin 9.2  Current treatment plan: Aranesp injections at 500 mcg every 3 weeksstarted 7/1/2021holdingsince9/14/2021(patient got severe pain and discomfort and fatigue after she receives for Aranesp injections and she did not respond to it)  Lab review: Today's hemoglobin is   Liver cirrhosis:Child's class is 3 which translates into a medium life expectancy of 1 to 3 years.  Hospitalization: 08/06/2019-08/14/2019: Acute NSTEMI (no interventions), CHF, respiratory failure, CKD, cirrhosis, pancytopenia, right lung collapse, pleural effusion: Chest tube Received 2 units of PRBC x2 and FFP  Severe fatigue: Markedly improved with B12 supplement sublingual 1000 mcg daily.  Return to clinicin 3 months forlabs and follow-up

## 2020-02-03 NOTE — Progress Notes (Signed)
Patient Care Team: Lawerance Cruel, MD as PCP - General (Family Medicine) [name], MD as PCP - Cardiology (Cardiology) Stephens Shire, MD as Referring Physician (Family Medicine)  DIAGNOSIS:    ICD-10-CM   1. Anemia due to stage 3 chronic kidney disease, unspecified whether stage 3a or 3b CKD (HCC)  N18.30 CBC with Differential (Cancer Center Only)   D63.1     CHIEF COMPLIANT: Follow-up ofchronic anemia and thrombocytopenia  INTERVAL HISTORY: Dana Watkins is a 65 y.o. with above-mentioned history of chronic anemiasecondary to stage 3 chronic kidney diseaseand thrombocytopenia.She presents to the clinic todayfor follow-up. Since she started taking the sublingual B12 she reports improved energy levels.  She does not have any new complaints or concerns.  ALLERGIES:  is allergic to sulfa antibiotics.  MEDICATIONS:  Current Outpatient Medications  Medication Sig Dispense Refill  . Ascorbic Acid (VITAMIN C) 1000 MG tablet Take 1,000 mg by mouth in the morning and at bedtime. Chewable    . B Complex Vitamins (B COMPLEX PO) Take by mouth.    . butalbital-acetaminophen-caffeine (FIORICET) 50-325-40 MG tablet Take 1 tablet by mouth every 6 (six) hours as needed for headache or migraine.     . carvedilol (COREG) 3.125 MG tablet Take 1 tablet (3.125 mg total) by mouth 2 (two) times daily with a meal. 180 tablet 3  . Cyanocobalamin 1000 MCG SUBL Place 1 tablet (1,000 mcg total) under the tongue daily. 90 tablet 3  . MAGNESIUM PO Take 400 mg by mouth daily.     . Multiple Vitamin (MULTIVITAMIN WITH MINERALS) TABS tablet Take 1 tablet by mouth daily. 30 tablet 0  . ondansetron (ZOFRAN-ODT) 4 MG disintegrating tablet DISSOLVE 1 TABLET UNDER TONGUE AS NEEDED EVERY 8 HOURS 20 tablet 0  . pantoprazole (PROTONIX) 40 MG tablet Take 40 mg by mouth as needed (for heartburn/acid reflux).    . potassium chloride SA (KLOR-CON) 20 MEQ tablet Take 1 tablet (20 mEq total) by mouth 2  (two) times daily. 60 tablet 6  . spironolactone (ALDACTONE) 25 MG tablet Take 25 mg by mouth in the morning and at bedtime.     No current facility-administered medications for this visit.    PHYSICAL EXAMINATION: ECOG PERFORMANCE STATUS: 1 - Symptomatic but completely ambulatory  Vitals:   02/04/20 1440  BP: (!) 152/71  Pulse: 98  Resp: 17  Temp: 97.7 F (36.5 C)  SpO2: 100%   Filed Weights   02/04/20 1440  Weight: 147 lb 4.8 oz (66.8 kg)    LABORATORY DATA:  I have reviewed the data as listed CMP Latest Ref Rng & Units 11/20/2019 10/23/2019 09/25/2019  Glucose 70 - 99 mg/dL 101(H) 122(H) 88  BUN 8 - 23 mg/dL 18 20 14   Creatinine 0.44 - 1.00 mg/dL 1.63(H) 1.87(H) 1.50(H)  Sodium 135 - 145 mmol/L 139 138 138  Potassium 3.5 - 5.1 mmol/L 3.6 3.5 3.3(L)  Chloride 98 - 111 mmol/L 115(H) 115(H) 117(H)  CO2 22 - 32 mmol/L 19(L) 19(L) 18(L)  Calcium 8.9 - 10.3 mg/dL 9.7 10.2 9.4  Total Protein 6.5 - 8.1 g/dL 6.1(L) 6.2(L) 5.8(L)  Total Bilirubin 0.3 - 1.2 mg/dL 5.0(HH) 5.9(HH) 4.3(HH)  Alkaline Phos 38 - 126 U/L 328(H) 306(H) 350(H)  AST 15 - 41 U/L 76(H) 69(H) 69(H)  ALT 0 - 44 U/L 38 36 30    Lab Results  Component Value Date   WBC 6.5 02/04/2020   HGB 8.5 (L) 02/04/2020   HCT 27.0 (  L) 02/04/2020   MCV 98.5 02/04/2020   PLT 79 (L) 02/04/2020   NEUTROABS 4.6 02/04/2020    ASSESSMENT & PLAN:  Anemia due to stage 3 chronic kidney disease Chronic normocytic anemia: GFR 36 Secondary to chronic kidney disease as well as secondary to cirrhosis of the liver.  Lab review  04/16/2019: Hemoglobin 8.1, MCV 96.3 platelets 57, erythropoietin 43.1, B12 1087, haptoglobin less than 10 (due to cirrhosis), total bilirubin 6.1, creatinine 1.3, LDH and folic acid normal 06/17/3708:GYIRSWNIOE 8.7, platelets 58 10/23/2019:Hemoglobin 9.2 02/03/2018: Hemoglobin 8.5, platelets 79  Current treatment plan: Aranesp injections at 500 mcg every 3 weeksstarted  7/1/2021holdingsince9/14/2021(patient got severe pain and discomfort and fatigue after she receives for Aranesp injections and she did not respond to it)  Lab review: Today's hemoglobin is 8.5  Liver cirrhosis:Child's class is 3 which translates into a medium life expectancy of 1 to 3 years.  Hospitalization: 08/06/2019-08/14/2019: Acute NSTEMI (no interventions), CHF, respiratory failure, CKD, cirrhosis, pancytopenia, right lung collapse, pleural effusion: Chest tube Received 2 units of PRBC x2 and FFP  Severe fatigue: Markedly improved with B12 supplement sublingual 1000 mcg daily.  Return to clinicin 3 months forlabs and follow-up    Orders Placed This Encounter  Procedures  . CBC with Differential (Cancer Center Only)    Standing Status:   Future    Standing Expiration Date:   02/03/2021   The patient has a good understanding of the overall plan. she agrees with it. she will call with any problems that may develop before the next visit here.  Total time spent: 20 mins including face to face time and time spent for planning, charting and coordination of care  Nicholas Lose, MD 02/04/2020  I, Cloyde Reams Dorshimer, am acting as scribe for Dr. Nicholas Lose.  I have reviewed the above documentation for accuracy and completeness, and I agree with the above.

## 2020-02-04 ENCOUNTER — Other Ambulatory Visit: Payer: Self-pay

## 2020-02-04 ENCOUNTER — Inpatient Hospital Stay: Payer: Medicare HMO | Admitting: Hematology and Oncology

## 2020-02-04 ENCOUNTER — Inpatient Hospital Stay: Payer: Medicare HMO | Attending: Hematology and Oncology | Admitting: Internal Medicine

## 2020-02-04 DIAGNOSIS — Z79899 Other long term (current) drug therapy: Secondary | ICD-10-CM | POA: Diagnosis not present

## 2020-02-04 DIAGNOSIS — K7460 Unspecified cirrhosis of liver: Secondary | ICD-10-CM | POA: Diagnosis not present

## 2020-02-04 DIAGNOSIS — N183 Chronic kidney disease, stage 3 unspecified: Secondary | ICD-10-CM

## 2020-02-04 DIAGNOSIS — R5383 Other fatigue: Secondary | ICD-10-CM | POA: Diagnosis not present

## 2020-02-04 DIAGNOSIS — D696 Thrombocytopenia, unspecified: Secondary | ICD-10-CM | POA: Diagnosis not present

## 2020-02-04 DIAGNOSIS — Z882 Allergy status to sulfonamides status: Secondary | ICD-10-CM | POA: Diagnosis not present

## 2020-02-04 DIAGNOSIS — D631 Anemia in chronic kidney disease: Secondary | ICD-10-CM

## 2020-02-04 LAB — CMP (CANCER CENTER ONLY)
ALT: 43 U/L (ref 0–44)
AST: 81 U/L — ABNORMAL HIGH (ref 15–41)
Albumin: 2.7 g/dL — ABNORMAL LOW (ref 3.5–5.0)
Alkaline Phosphatase: 364 U/L — ABNORMAL HIGH (ref 38–126)
Anion gap: 6 (ref 5–15)
BUN: 15 mg/dL (ref 8–23)
CO2: 16 mmol/L — ABNORMAL LOW (ref 22–32)
Calcium: 9.7 mg/dL (ref 8.9–10.3)
Chloride: 114 mmol/L — ABNORMAL HIGH (ref 98–111)
Creatinine: 1.53 mg/dL — ABNORMAL HIGH (ref 0.44–1.00)
GFR, Estimated: 38 mL/min — ABNORMAL LOW (ref >60)
Glucose, Bld: 100 mg/dL — ABNORMAL HIGH (ref 70–99)
Potassium: 3.9 mmol/L (ref 3.5–5.1)
Sodium: 136 mmol/L (ref 135–145)
Total Bilirubin: 7.1 mg/dL (ref 0.3–1.2)
Total Protein: 6.4 g/dL — ABNORMAL LOW (ref 6.5–8.1)

## 2020-02-04 LAB — CBC WITH DIFFERENTIAL (CANCER CENTER ONLY)
Abs Immature Granulocytes: 0.01 10*3/uL (ref 0.00–0.07)
Basophils Absolute: 0.1 10*3/uL (ref 0.0–0.1)
Basophils Relative: 1 %
Eosinophils Absolute: 0.2 10*3/uL (ref 0.0–0.5)
Eosinophils Relative: 3 %
HCT: 27 % — ABNORMAL LOW (ref 36.0–46.0)
Hemoglobin: 8.5 g/dL — ABNORMAL LOW (ref 12.0–15.0)
Immature Granulocytes: 0 %
Lymphocytes Relative: 14 %
Lymphs Abs: 0.9 10*3/uL (ref 0.7–4.0)
MCH: 31 pg (ref 26.0–34.0)
MCHC: 31.5 g/dL (ref 30.0–36.0)
MCV: 98.5 fL (ref 80.0–100.0)
Monocytes Absolute: 0.8 10*3/uL (ref 0.1–1.0)
Monocytes Relative: 12 %
Neutro Abs: 4.6 10*3/uL (ref 1.7–7.7)
Neutrophils Relative %: 70 %
Platelet Count: 79 10*3/uL — ABNORMAL LOW (ref 150–400)
RBC: 2.74 MIL/uL — ABNORMAL LOW (ref 3.87–5.11)
RDW: 17.6 % — ABNORMAL HIGH (ref 11.5–15.5)
WBC Count: 6.5 10*3/uL (ref 4.0–10.5)
nRBC: 0 % (ref 0.0–0.2)

## 2020-02-04 LAB — FERRITIN: Ferritin: 53 ng/mL (ref 11–307)

## 2020-02-04 LAB — SAMPLE TO BLOOD BANK

## 2020-02-04 NOTE — Assessment & Plan Note (Signed)
Chronic normocytic anemia: GFR 36 Secondary to chronic kidney disease as well as secondary to cirrhosis of the liver.  Lab review  04/16/2019: Hemoglobin 8.1, MCV 96.3 platelets 57, erythropoietin 43.1, B12 1087, haptoglobin less than 10 (due to cirrhosis), total bilirubin 6.1, creatinine 1.3, LDH and folic acid normal 09/06/784:[id number] 8.7, platelets 58 10/23/2019:Hemoglobin 9.2  Current treatment plan: Aranesp injections at 500 mcg every 3 weeksstarted 7/1/2021holdingsince9/14/2021(patient got severe pain and discomfort and fatigue after she receives for Aranesp injections and she did not respond to it)  Lab review: Today's hemoglobin is    Liver cirrhosis:Child's class is 3 which translates into a medium life expectancy of 1 to 3 years.  Hospitalization: 08/06/2019-08/14/2019: Acute NSTEMI (no interventions), CHF, respiratory failure, CKD, cirrhosis, pancytopenia, right lung collapse, pleural effusion: Chest tube Received 2 units of PRBC x2 and FFP  Severe fatigue: Markedly improved with B12 supplement sublingual 1000 mcg daily.  Return to clinicin 3 months forlabs and follow-up

## 2020-02-07 ENCOUNTER — Telehealth: Payer: Self-pay | Admitting: Hematology and Oncology

## 2020-02-07 NOTE — Telephone Encounter (Signed)
Scheduled per 1/24 los. Called and spoke with pt confirmed 4/25 appts

## 2020-02-14 DIAGNOSIS — S32591A Other specified fracture of right pubis, initial encounter for closed fracture: Secondary | ICD-10-CM | POA: Diagnosis not present

## 2020-02-20 ENCOUNTER — Emergency Department (HOSPITAL_COMMUNITY): Payer: Medicare HMO

## 2020-02-20 ENCOUNTER — Inpatient Hospital Stay (HOSPITAL_COMMUNITY)
Admission: EM | Admit: 2020-02-20 | Discharge: 2020-02-28 | DRG: 432 | Discharge status: Against Medical Advice | Payer: Medicare HMO | Attending: Internal Medicine | Admitting: Internal Medicine

## 2020-02-20 ENCOUNTER — Encounter (HOSPITAL_COMMUNITY): Payer: Self-pay

## 2020-02-20 DIAGNOSIS — K7031 Alcoholic cirrhosis of liver with ascites: Secondary | ICD-10-CM | POA: Diagnosis present

## 2020-02-20 DIAGNOSIS — Z7189 Other specified counseling: Secondary | ICD-10-CM | POA: Diagnosis not present

## 2020-02-20 DIAGNOSIS — N1831 Chronic kidney disease, stage 3a: Secondary | ICD-10-CM | POA: Diagnosis present

## 2020-02-20 DIAGNOSIS — N1832 Chronic kidney disease, stage 3b: Secondary | ICD-10-CM | POA: Diagnosis present

## 2020-02-20 DIAGNOSIS — S60221A Contusion of right hand, initial encounter: Secondary | ICD-10-CM | POA: Diagnosis present

## 2020-02-20 DIAGNOSIS — K7011 Alcoholic hepatitis with ascites: Secondary | ICD-10-CM | POA: Diagnosis present

## 2020-02-20 DIAGNOSIS — W19XXXA Unspecified fall, initial encounter: Secondary | ICD-10-CM | POA: Insufficient documentation

## 2020-02-20 DIAGNOSIS — U071 COVID-19: Secondary | ICD-10-CM | POA: Diagnosis not present

## 2020-02-20 DIAGNOSIS — Z8249 Family history of ischemic heart disease and other diseases of the circulatory system: Secondary | ICD-10-CM | POA: Diagnosis not present

## 2020-02-20 DIAGNOSIS — S32501A Unspecified fracture of right pubis, initial encounter for closed fracture: Secondary | ICD-10-CM | POA: Diagnosis not present

## 2020-02-20 DIAGNOSIS — R69 Illness, unspecified: Secondary | ICD-10-CM | POA: Diagnosis not present

## 2020-02-20 DIAGNOSIS — S329XXA Fracture of unspecified parts of lumbosacral spine and pelvis, initial encounter for closed fracture: Secondary | ICD-10-CM

## 2020-02-20 DIAGNOSIS — I130 Hypertensive heart and chronic kidney disease with heart failure and stage 1 through stage 4 chronic kidney disease, or unspecified chronic kidney disease: Secondary | ICD-10-CM | POA: Diagnosis present

## 2020-02-20 DIAGNOSIS — D631 Anemia in chronic kidney disease: Secondary | ICD-10-CM | POA: Diagnosis present

## 2020-02-20 DIAGNOSIS — I2510 Atherosclerotic heart disease of native coronary artery without angina pectoris: Secondary | ICD-10-CM | POA: Diagnosis present

## 2020-02-20 DIAGNOSIS — K766 Portal hypertension: Secondary | ICD-10-CM | POA: Diagnosis not present

## 2020-02-20 DIAGNOSIS — D731 Hypersplenism: Secondary | ICD-10-CM | POA: Diagnosis present

## 2020-02-20 DIAGNOSIS — R188 Other ascites: Secondary | ICD-10-CM | POA: Diagnosis not present

## 2020-02-20 DIAGNOSIS — D696 Thrombocytopenia, unspecified: Secondary | ICD-10-CM | POA: Diagnosis not present

## 2020-02-20 DIAGNOSIS — F1010 Alcohol abuse, uncomplicated: Secondary | ICD-10-CM | POA: Diagnosis present

## 2020-02-20 DIAGNOSIS — D649 Anemia, unspecified: Secondary | ICD-10-CM | POA: Diagnosis present

## 2020-02-20 DIAGNOSIS — Z743 Need for continuous supervision: Secondary | ICD-10-CM | POA: Diagnosis not present

## 2020-02-20 DIAGNOSIS — K7040 Alcoholic hepatic failure without coma: Secondary | ICD-10-CM | POA: Diagnosis not present

## 2020-02-20 DIAGNOSIS — R7989 Other specified abnormal findings of blood chemistry: Secondary | ICD-10-CM

## 2020-02-20 DIAGNOSIS — K7290 Hepatic failure, unspecified without coma: Secondary | ICD-10-CM | POA: Diagnosis not present

## 2020-02-20 DIAGNOSIS — K7030 Alcoholic cirrhosis of liver without ascites: Secondary | ICD-10-CM | POA: Diagnosis not present

## 2020-02-20 DIAGNOSIS — Z515 Encounter for palliative care: Secondary | ICD-10-CM

## 2020-02-20 DIAGNOSIS — F411 Generalized anxiety disorder: Secondary | ICD-10-CM | POA: Diagnosis present

## 2020-02-20 DIAGNOSIS — Y929 Unspecified place or not applicable: Secondary | ICD-10-CM

## 2020-02-20 DIAGNOSIS — Z5329 Procedure and treatment not carried out because of patient's decision for other reasons: Secondary | ICD-10-CM | POA: Diagnosis present

## 2020-02-20 DIAGNOSIS — I5042 Chronic combined systolic (congestive) and diastolic (congestive) heart failure: Secondary | ICD-10-CM | POA: Diagnosis present

## 2020-02-20 DIAGNOSIS — M25551 Pain in right hip: Secondary | ICD-10-CM | POA: Diagnosis not present

## 2020-02-20 DIAGNOSIS — I4891 Unspecified atrial fibrillation: Secondary | ICD-10-CM | POA: Diagnosis not present

## 2020-02-20 DIAGNOSIS — J9620 Acute and chronic respiratory failure, unspecified whether with hypoxia or hypercapnia: Secondary | ICD-10-CM | POA: Diagnosis not present

## 2020-02-20 DIAGNOSIS — Z882 Allergy status to sulfonamides status: Secondary | ICD-10-CM

## 2020-02-20 DIAGNOSIS — K7682 Hepatic encephalopathy: Secondary | ICD-10-CM | POA: Diagnosis present

## 2020-02-20 DIAGNOSIS — S32511A Fracture of superior rim of right pubis, initial encounter for closed fracture: Secondary | ICD-10-CM | POA: Diagnosis not present

## 2020-02-20 DIAGNOSIS — D6959 Other secondary thrombocytopenia: Secondary | ICD-10-CM | POA: Diagnosis present

## 2020-02-20 DIAGNOSIS — K7210 Chronic hepatic failure without coma: Secondary | ICD-10-CM | POA: Diagnosis not present

## 2020-02-20 DIAGNOSIS — E559 Vitamin D deficiency, unspecified: Secondary | ICD-10-CM | POA: Diagnosis present

## 2020-02-20 DIAGNOSIS — S32591A Other specified fracture of right pubis, initial encounter for closed fracture: Secondary | ICD-10-CM | POA: Diagnosis not present

## 2020-02-20 DIAGNOSIS — J9811 Atelectasis: Secondary | ICD-10-CM | POA: Diagnosis not present

## 2020-02-20 DIAGNOSIS — Y900 Blood alcohol level of less than 20 mg/100 ml: Secondary | ICD-10-CM | POA: Diagnosis present

## 2020-02-20 DIAGNOSIS — Z66 Do not resuscitate: Secondary | ICD-10-CM | POA: Diagnosis not present

## 2020-02-20 DIAGNOSIS — K7460 Unspecified cirrhosis of liver: Secondary | ICD-10-CM

## 2020-02-20 DIAGNOSIS — Z87891 Personal history of nicotine dependence: Secondary | ICD-10-CM

## 2020-02-20 DIAGNOSIS — D689 Coagulation defect, unspecified: Secondary | ICD-10-CM | POA: Diagnosis not present

## 2020-02-20 DIAGNOSIS — R945 Abnormal results of liver function studies: Secondary | ICD-10-CM

## 2020-02-20 DIAGNOSIS — E876 Hypokalemia: Secondary | ICD-10-CM | POA: Diagnosis present

## 2020-02-20 DIAGNOSIS — Z79899 Other long term (current) drug therapy: Secondary | ICD-10-CM

## 2020-02-20 DIAGNOSIS — R52 Pain, unspecified: Secondary | ICD-10-CM | POA: Diagnosis not present

## 2020-02-20 DIAGNOSIS — I10 Essential (primary) hypertension: Secondary | ICD-10-CM | POA: Diagnosis not present

## 2020-02-20 LAB — I-STAT VENOUS BLOOD GAS, ED
Acid-base deficit: 8 mmol/L — ABNORMAL HIGH (ref 0.0–2.0)
Bicarbonate: 15.3 mmol/L — ABNORMAL LOW (ref 20.0–28.0)
Calcium, Ion: 1.28 mmol/L (ref 1.15–1.40)
HCT: 20 % — ABNORMAL LOW (ref 36.0–46.0)
Hemoglobin: 6.8 g/dL — CL (ref 12.0–15.0)
O2 Saturation: 84 %
Potassium: 3.2 mmol/L — ABNORMAL LOW (ref 3.5–5.1)
Sodium: 142 mmol/L (ref 135–145)
TCO2: 16 mmol/L — ABNORMAL LOW (ref 22–32)
pCO2, Ven: 21.3 mmHg — ABNORMAL LOW (ref 44.0–60.0)
pH, Ven: 7.463 — ABNORMAL HIGH (ref 7.250–7.430)
pO2, Ven: 44 mmHg (ref 32.0–45.0)

## 2020-02-20 LAB — COMPREHENSIVE METABOLIC PANEL
ALT: 52 U/L — ABNORMAL HIGH (ref 0–44)
ALT: 52 U/L — ABNORMAL HIGH (ref 0–44)
AST: 123 U/L — ABNORMAL HIGH (ref 15–41)
AST: 117 U/L — ABNORMAL HIGH (ref 15–41)
Albumin: 2.2 g/dL — ABNORMAL LOW (ref 3.5–5.0)
Albumin: 2.1 g/dL — ABNORMAL LOW (ref 3.5–5.0)
Alkaline Phosphatase: 261 U/L — ABNORMAL HIGH (ref 38–126)
Alkaline Phosphatase: 237 U/L — ABNORMAL HIGH (ref 38–126)
Anion gap: 10 (ref 5–15)
Anion gap: 10 (ref 5–15)
BUN: 19 mg/dL (ref 8–23)
BUN: 18 mg/dL (ref 8–23)
CO2: 16 mmol/L — ABNORMAL LOW (ref 22–32)
CO2: 15 mmol/L — ABNORMAL LOW (ref 22–32)
Calcium: 9.4 mg/dL (ref 8.9–10.3)
Calcium: 9 mg/dL (ref 8.9–10.3)
Chloride: 113 mmol/L — ABNORMAL HIGH (ref 98–111)
Chloride: 113 mmol/L — ABNORMAL HIGH (ref 98–111)
Creatinine, Ser: 1.61 mg/dL — ABNORMAL HIGH (ref 0.44–1.00)
Creatinine, Ser: 1.51 mg/dL — ABNORMAL HIGH (ref 0.44–1.00)
GFR, Estimated: 36 mL/min — ABNORMAL LOW (ref >60)
GFR, Estimated: 38 mL/min — ABNORMAL LOW (ref >60)
Glucose, Bld: 105 mg/dL — ABNORMAL HIGH (ref 70–99)
Glucose, Bld: 90 mg/dL (ref 70–99)
Potassium: 3.2 mmol/L — ABNORMAL LOW (ref 3.5–5.1)
Potassium: 3.1 mmol/L — ABNORMAL LOW (ref 3.5–5.1)
Sodium: 139 mmol/L (ref 135–145)
Sodium: 138 mmol/L (ref 135–145)
Total Bilirubin: 7.7 mg/dL — ABNORMAL HIGH (ref 0.3–1.2)
Total Bilirubin: 7.5 mg/dL — ABNORMAL HIGH (ref 0.3–1.2)
Total Protein: 5.5 g/dL — ABNORMAL LOW (ref 6.5–8.1)
Total Protein: 5.1 g/dL — ABNORMAL LOW (ref 6.5–8.1)

## 2020-02-20 LAB — CBC WITH DIFFERENTIAL/PLATELET
Abs Immature Granulocytes: 0.02 10*3/uL (ref 0.00–0.07)
Basophils Absolute: 0 10*3/uL (ref 0.0–0.1)
Basophils Relative: 0 %
Eosinophils Absolute: 0.1 10*3/uL (ref 0.0–0.5)
Eosinophils Relative: 2 %
HCT: 23.5 % — ABNORMAL LOW (ref 36.0–46.0)
Hemoglobin: 7.6 g/dL — ABNORMAL LOW (ref 12.0–15.0)
Immature Granulocytes: 0 %
Lymphocytes Relative: 13 %
Lymphs Abs: 0.8 10*3/uL (ref 0.7–4.0)
MCH: 32.5 pg (ref 26.0–34.0)
MCHC: 32.3 g/dL (ref 30.0–36.0)
MCV: 100.4 fL — ABNORMAL HIGH (ref 80.0–100.0)
Monocytes Absolute: 0.8 10*3/uL (ref 0.1–1.0)
Monocytes Relative: 12 %
Neutro Abs: 4.8 10*3/uL (ref 1.7–7.7)
Neutrophils Relative %: 73 %
Platelets: 42 10*3/uL — ABNORMAL LOW (ref 150–400)
RBC: 2.34 MIL/uL — ABNORMAL LOW (ref 3.87–5.11)
RDW: 18.1 % — ABNORMAL HIGH (ref 11.5–15.5)
WBC: 6.5 10*3/uL (ref 4.0–10.5)
nRBC: 0 % (ref 0.0–0.2)

## 2020-02-20 LAB — URINALYSIS, ROUTINE W REFLEX MICROSCOPIC
Bilirubin Urine: NEGATIVE
Glucose, UA: 100 mg/dL — AB
Ketones, ur: NEGATIVE mg/dL
Nitrite: NEGATIVE
Protein, ur: 30 mg/dL — AB
Specific Gravity, Urine: 1.025 (ref 1.005–1.030)
pH: 6 (ref 5.0–8.0)

## 2020-02-20 LAB — SARS CORONAVIRUS 2 (TAT 6-24 HRS): SARS Coronavirus 2: POSITIVE — AB

## 2020-02-20 LAB — URINALYSIS, MICROSCOPIC (REFLEX)

## 2020-02-20 LAB — CBC
HCT: 22.7 % — ABNORMAL LOW (ref 36.0–46.0)
Hemoglobin: 7.1 g/dL — ABNORMAL LOW (ref 12.0–15.0)
MCH: 31.3 pg (ref 26.0–34.0)
MCHC: 31.3 g/dL (ref 30.0–36.0)
MCV: 100 fL (ref 80.0–100.0)
Platelets: 42 10*3/uL — ABNORMAL LOW (ref 150–400)
RBC: 2.27 MIL/uL — ABNORMAL LOW (ref 3.87–5.11)
RDW: 18.1 % — ABNORMAL HIGH (ref 11.5–15.5)
WBC: 6 10*3/uL (ref 4.0–10.5)
nRBC: 0 % (ref 0.0–0.2)

## 2020-02-20 LAB — IRON AND TIBC
Iron: 130 ug/dL (ref 28–170)
Saturation Ratios: 56 % — ABNORMAL HIGH (ref 10.4–31.8)
TIBC: 232 ug/dL — ABNORMAL LOW (ref 250–450)
UIBC: 102 ug/dL

## 2020-02-20 LAB — PROTIME-INR
INR: 2.2 — ABNORMAL HIGH (ref 0.8–1.2)
Prothrombin Time: 24 seconds — ABNORMAL HIGH (ref 11.4–15.2)

## 2020-02-20 LAB — PHOSPHORUS: Phosphorus: 2.4 mg/dL — ABNORMAL LOW (ref 2.5–4.6)

## 2020-02-20 LAB — MAGNESIUM: Magnesium: 1.9 mg/dL (ref 1.7–2.4)

## 2020-02-20 LAB — AMMONIA: Ammonia: 45 umol/L — ABNORMAL HIGH (ref 9–35)

## 2020-02-20 LAB — PREPARE RBC (CROSSMATCH)

## 2020-02-20 LAB — FERRITIN: Ferritin: 59 ng/mL (ref 11–307)

## 2020-02-20 MED ORDER — ADULT MULTIVITAMIN W/MINERALS CH
1.0000 | ORAL_TABLET | Freq: Every day | ORAL | Status: DC
  Administered 2020-02-20 – 2020-02-28 (×9): 1 via ORAL
  Filled 2020-02-20 (×9): qty 1

## 2020-02-20 MED ORDER — SODIUM CHLORIDE 0.9% IV SOLUTION
Freq: Once | INTRAVENOUS | Status: AC
  Administered 2020-02-20: 500 mL via INTRAVENOUS

## 2020-02-20 MED ORDER — ONDANSETRON 4 MG PO TBDP: 4.0000 mg | ORAL_TABLET | Freq: Three times a day (TID) | ORAL | Status: DC | PRN

## 2020-02-20 MED ORDER — MAGNESIUM OXIDE 400 (241.3 MG) MG PO TABS
400.0000 mg | ORAL_TABLET | Freq: Every day | ORAL | Status: DC
  Administered 2020-02-20 – 2020-02-28 (×9): 400 mg via ORAL
  Filled 2020-02-20 (×3): qty 1
  Filled 2020-02-20: qty 2
  Filled 2020-02-20 (×6): qty 1

## 2020-02-20 MED ORDER — THIAMINE HCL 100 MG PO TABS
100.0000 mg | ORAL_TABLET | Freq: Every day | ORAL | Status: DC
  Administered 2020-02-20 – 2020-02-28 (×8): 100 mg via ORAL
  Filled 2020-02-20 (×9): qty 1

## 2020-02-20 MED ORDER — LORAZEPAM 2 MG/ML IJ SOLN: 1.0000 mg | INTRAMUSCULAR | Status: AC | PRN

## 2020-02-20 MED ORDER — PHYTONADIONE 5 MG PO TABS
10.0000 mg | ORAL_TABLET | Freq: Once | ORAL | Status: DC
  Filled 2020-02-20 (×2): qty 2

## 2020-02-20 MED ORDER — BUTALBITAL-APAP-CAFFEINE 50-325-40 MG PO TABS: 1.0000 | ORAL_TABLET | Freq: Four times a day (QID) | ORAL | Status: DC | PRN

## 2020-02-20 MED ORDER — SPIRONOLACTONE 25 MG PO TABS
25.0000 mg | ORAL_TABLET | Freq: Every day | ORAL | Status: DC
  Administered 2020-02-20 – 2020-02-27 (×8): 25 mg via ORAL
  Filled 2020-02-20 (×9): qty 1

## 2020-02-20 MED ORDER — CARVEDILOL 3.125 MG PO TABS
3.1250 mg | ORAL_TABLET | Freq: Two times a day (BID) | ORAL | Status: DC
  Administered 2020-02-20 – 2020-02-24 (×9): 3.125 mg via ORAL
  Filled 2020-02-20 (×11): qty 1

## 2020-02-20 MED ORDER — CYANOCOBALAMIN 1000 MCG SL SUBL: 1.0000 | SUBLINGUAL_TABLET | Freq: Every day | SUBLINGUAL | Status: DC

## 2020-02-20 MED ORDER — LORAZEPAM 1 MG PO TABS: 1.0000 mg | ORAL_TABLET | ORAL | Status: AC | PRN

## 2020-02-20 MED ORDER — FOLIC ACID 1 MG PO TABS
1.0000 mg | ORAL_TABLET | Freq: Every day | ORAL | Status: DC
  Administered 2020-02-20 – 2020-02-28 (×9): 1 mg via ORAL
  Filled 2020-02-20 (×9): qty 1

## 2020-02-20 MED ORDER — ASCORBIC ACID 500 MG PO TABS
1000.0000 mg | ORAL_TABLET | Freq: Two times a day (BID) | ORAL | Status: DC
  Administered 2020-02-20 – 2020-02-28 (×15): 1000 mg via ORAL
  Filled 2020-02-20 (×16): qty 2

## 2020-02-20 MED ORDER — SODIUM BICARBONATE 650 MG PO TABS
650.0000 mg | ORAL_TABLET | Freq: Two times a day (BID) | ORAL | Status: DC
  Administered 2020-02-20 – 2020-02-28 (×15): 650 mg via ORAL
  Filled 2020-02-20 (×17): qty 1

## 2020-02-20 MED ORDER — THIAMINE HCL 100 MG/ML IJ SOLN
100.0000 mg | Freq: Every day | INTRAMUSCULAR | Status: DC
  Administered 2020-02-27: 100 mg via INTRAVENOUS
  Filled 2020-02-20 (×4): qty 2

## 2020-02-20 MED ORDER — HYDROMORPHONE HCL 1 MG/ML IJ SOLN
0.5000 mg | INTRAMUSCULAR | Status: DC | PRN
Start: 2020-02-20 — End: 2020-02-21
  Administered 2020-02-20 – 2020-02-21 (×3): 1 mg via INTRAVENOUS
  Filled 2020-02-20 (×3): qty 1

## 2020-02-20 MED ORDER — POTASSIUM CHLORIDE CRYS ER 20 MEQ PO TBCR
20.0000 meq | EXTENDED_RELEASE_TABLET | Freq: Two times a day (BID) | ORAL | Status: DC
  Administered 2020-02-20 – 2020-02-28 (×15): 20 meq via ORAL
  Filled 2020-02-20 (×16): qty 1

## 2020-02-20 MED ORDER — PANTOPRAZOLE SODIUM 40 MG PO TBEC: 40.0000 mg | DELAYED_RELEASE_TABLET | ORAL | Status: DC | PRN

## 2020-02-20 NOTE — ED Triage Notes (Signed)
Pt from home with ems c.o worsening right hip pain, known right hip fracture, another fall 2 days ago. Pt has hx of liver failure, appears jaundice, drowsy but denies taking any of her home oxycodone. VSS

## 2020-02-20 NOTE — H&P (Signed)
History and Physical    Dana Watkins XBJ:[id number] DOB: 1955/11/04 DOA: 02/20/2020  PCP: Lawerance Cruel, MD (Confirm with patient/family/NH records and if not entered, this has to be entered at [facility] point of entry) Patient coming from: Home  I have personally briefly reviewed patient's old medical records in [place]  Chief Complaint: Hip pain  HPI: Dana Watkins is a 65 y.o. female with medical history significant of liver cirrhosis secondary to alcohol abuse, chronic thrombocytopenia secondary to cirrhosis and remote ITP, CKD stage III, hypokalemia, presented with fall and hip pain.  Patient reported that he stepped on black ice this Monday and fell down in the parking lot.  She went to see orthopedic surgery who did outpatient imaging study showed pubic Rami fracture, stable, and patient sent home with p.o. oxycodone.  Despite taking oxycodone, patient continued to feel frequent breakthrough pain of right hip and fell down another 2-3 times since yesterday.  Denies any head injury, loss of consciousness.  Patient denied any black tarry stool or blood in the stool.  No abdominal pain.  No fever.  Patient admitted used to be a heavy drinker, but " only drinks a glass of wine here and there" and she admitted last drink was last week.  Patient also reported she has lost follow-up with GI since summer 2021, and she has been following with oncology for B12 shots.  ED Course: Worsening of liver function, CT pelvic showed stable right pubic Rami fracture.  Review of Systems: As per HPI otherwise 10 point review of systems negative.    Past Medical History:  Diagnosis Date  . Acute hypokalemia 11/26/2014  . Arthritis 05/15/2015  . Benign essential hypertension 05/15/2015  . C. difficile colitis   . CAD (coronary artery disease)    a. cath 04/2017: ""Diffuse, calcific CAD particularly in the distal RCA and proximal to mid LAD.  LAD disease is nonobstructive.  RCA disease is more  severe but does not appear significant.  Given her lack of symptoms, would pursue medical therapy."  . CKD (chronic kidney disease), stage III ([place])   . Hypokalemia   . Migraine headache 05/15/2015  . Mild mitral regurgitation   . Pancytopenia ([place])   . Sepsis ([place]) 01/2017  . Thrombocytopenia ([place])    a. chronic thrombocytopenia (ITP - remotely saw hematology).  . Vitamin D deficiency     Past Surgical History:  Procedure Laterality Date  . CESAREAN SECTION     24 years ago  . ESOPHAGOGASTRODUODENOSCOPY (EGD) WITH PROPOFOL N/A 06/13/2019   Procedure: ESOPHAGOGASTRODUODENOSCOPY (EGD) WITH PROPOFOL;  Surgeon: Arta Silence, MD;  Location: [facility] ENDOSCOPY;  Service: Endoscopy;  Laterality: N/A;  administration of one bag of platelets   . LEFT HEART CATH AND CORONARY ANGIOGRAPHY N/A 05/12/2017   Procedure: LEFT HEART CATH AND CORONARY ANGIOGRAPHY;  Surgeon: [name] Booze, MD;  Location: [facility];  Service: Cardiovascular;  Laterality: N/A;  . THORACENTESIS N/A 08/28/2019   Procedure: [facility];  Surgeon: Candee Furbish, MD;  Location: [facility] ENDOSCOPY;  Service: Pulmonary;  Laterality: N/A;  . tonsils and adneoids     as a child  . ULTRASOUND GUIDANCE FOR VASCULAR ACCESS  05/12/2017   Procedure: Ultrasound Guidance For Vascular Access;  Surgeon: [name] Booze, MD;  Location: [facility];  Service: Cardiovascular;;     reports that she quit smoking about 5 years ago. She started smoking about 51 years ago. She quit after 2.00 years of  use. She has never used smokeless tobacco. She reports current alcohol use. She reports that she does not use drugs.  Allergies  Allergen Reactions  . Sulfa Antibiotics Nausea And Vomiting and Rash    Family History  Problem Relation Age of Onset  . Pernicious anemia Maternal Grandfather   . Heart attack Maternal Grandfather   . Atrial fibrillation Mother   . Supraventricular tachycardia Father   . Heart disease Paternal  Grandfather      Prior to Admission medications   Medication Sig Start Date End Date Taking? Authorizing Provider  Ascorbic Acid (VITAMIN C) 1000 MG tablet Take 1,000 mg by mouth in the morning and at bedtime. Chewable    [provider]  B Complex Vitamins (B COMPLEX PO) Take by mouth.    [provider]  butalbital-acetaminophen-caffeine (FIORICET) 50-325-40 MG tablet Take 1 tablet by mouth every 6 (six) hours as needed for headache or migraine.  03/09/19   [provider]  carvedilol (COREG) 3.125 MG tablet Take 1 tablet (3.125 mg total) by mouth 2 (two) times daily with a meal. 12/25/19   [name], MD  Cyanocobalamin 1000 MCG SUBL Place 1 tablet (1,000 mcg total) under the tongue daily. 10/23/19   Nicholas Lose, MD  MAGNESIUM PO Take 400 mg by mouth daily.     [provider]  Multiple Vitamin (MULTIVITAMIN WITH MINERALS) TABS tablet Take 1 tablet by mouth daily. 06/29/18   Sheikh, Omair Latif, DO  ondansetron (ZOFRAN-ODT) 4 MG disintegrating tablet DISSOLVE 1 TABLET UNDER TONGUE AS NEEDED EVERY 8 HOURS 07/23/19   Nicholas Lose, MD  pantoprazole (PROTONIX) 40 MG tablet Take 40 mg by mouth as needed (for heartburn/acid reflux).    [provider]  potassium chloride SA (KLOR-CON) 20 MEQ tablet Take 1 tablet (20 mEq total) by mouth 2 (two) times daily. 08/14/19   Dixie Dials, MD  spironolactone (ALDACTONE) 25 MG tablet Take 25 mg by mouth in the morning and at bedtime.    [provider]    Physical Exam: Vitals:   02/20/20 0811 02/20/20 1015 02/20/20 1315 02/20/20 1330  BP: 138/68 (!) 162/79 128/73 135/71  Pulse: 92 99 95 94  Resp: 18 16  20   Temp: 98 F (36.7 C)     TempSrc: Oral     SpO2: 96% 99% 99% 99%    Constitutional: NAD, calm, comfortable Vitals:   02/20/20 0811 02/20/20 1015 02/20/20 1315 02/20/20 1330  BP: 138/68 (!) 162/79 128/73 135/71  Pulse: 92 99 95 94  Resp: 18 16  20   Temp: 98 F (36.7 C)      TempSrc: Oral     SpO2: 96% 99% 99% 99%   Eyes: PERRL, positive for jaundice ENMT: Mucous membranes are moist. Posterior pharynx clear of any exudate or lesions.Normal dentition.  Neck: normal, supple, no masses, no thyromegaly Respiratory: clear to auscultation bilaterally, no wheezing, no crackles. Normal respiratory effort. No accessory muscle use.  Cardiovascular: Regular rate and rhythm, no murmurs / rubs / gallops. No extremity edema. 2+ pedal pulses. No carotid bruits.  Abdomen: no tenderness, no masses palpated. No hepatosplenomegaly. Bowel sounds positive.  Musculoskeletal: no clubbing / cyanosis. No joint deformity upper and lower extremities. Good ROM, no contractures. Normal muscle tone.  Skin: no rashes, lesions, ulcers. No induration. Positive for petechia, multiple bruises noticed on bilateral arms, spider angioma Neurologic: CN 2-12 grossly intact. Sensation intact, DTR normal. Strength 5/5 in all 4.  Psychiatric: Normal judgment and insight.  Alert and oriented x 3. Normal mood.     Labs on Admission: I have personally reviewed following labs and imaging studies  CBC: Recent Labs  Lab 02/20/20 1126  WBC 6.5  NEUTROABS 4.8  HGB 7.6*  HCT 23.5*  MCV 100.4*  PLT 42*   Basic Metabolic Panel: Recent Labs  Lab 02/20/20 1126  NA 139  K 3.2*  CL 113*  CO2 16*  GLUCOSE 105*  BUN 19  CREATININE 1.61*  CALCIUM 9.4   GFR: CrCl cannot be calculated (Unknown ideal weight.). Liver Function Tests: Recent Labs  Lab 02/20/20 1126  AST 123*  ALT 52*  ALKPHOS 261*  BILITOT 7.7*  PROT 5.5*  ALBUMIN 2.2*   No results for input(s): LIPASE, AMYLASE in the last 168 hours. Recent Labs  Lab 02/20/20 1127  AMMONIA 45*   Coagulation Profile: Recent Labs  Lab 02/20/20 1126  INR 2.2*   Cardiac Enzymes: No results for input(s): CKTOTAL, CKMB, CKMBINDEX, TROPONINI in the last 168 hours. BNP (last 3 results) Recent Labs    08/23/19 1030  PROBNP 190.0*    HbA1C: No results for input(s): HGBA1C in the last 72 hours. CBG: No results for input(s): GLUCAP in the last 168 hours. Lipid Profile: No results for input(s): CHOL, HDL, LDLCALC, TRIG, CHOLHDL, LDLDIRECT in the last 72 hours. Thyroid Function Tests: No results for input(s): TSH, T4TOTAL, FREET4, T3FREE, THYROIDAB in the last 72 hours. Anemia Panel: No results for input(s): VITAMINB12, FOLATE, FERRITIN, TIBC, IRON, RETICCTPCT in the last 72 hours. Urine analysis:    Component Value Date/Time   COLORURINE YELLOW 02/20/2019 1120   APPEARANCEUR HAZY (A) 02/20/2019 1120   LABSPEC 1.005 02/20/2019 1120   PHURINE 7.0 02/20/2019 1120   GLUCOSEU 150 (A) 02/20/2019 1120   HGBUR SMALL (A) 02/20/2019 1120   BILIRUBINUR NEGATIVE 02/20/2019 1120   KETONESUR NEGATIVE 02/20/2019 1120   PROTEINUR NEGATIVE 02/20/2019 1120   NITRITE NEGATIVE 02/20/2019 1120   LEUKOCYTESUR TRACE (A) 02/20/2019 1120    Radiological Exams on Admission: CT PELVIS WO CONTRAST  Result Date: 02/20/2020 CLINICAL DATA:  Right hip pain after fall 2 days ago. EXAM: CT PELVIS WITHOUT CONTRAST TECHNIQUE: Multidetector CT imaging of the pelvis was performed following the standard protocol without intravenous contrast. COMPARISON:  June 25, 2018. FINDINGS: Urinary Tract:  No abnormality visualized. Bowel:  Unremarkable visualized pelvic bowel loops. Vascular/Lymphatic: Atherosclerosis of abdominal aorta is noted. No adenopathy is noted. Reproductive:  No mass or other significant abnormality Other: Mild ascites is noted in the pelvis. Minimal anasarca is noted. No hernia is noted. Musculoskeletal: Minimally displaced acute fractures are seen involving the right inferior and superior pubic rami. The hip joints are unremarkable. IMPRESSION: 1. Minimally displaced acute fractures are seen involving the right inferior and superior pubic rami. 2. Mild ascites is noted in the pelvis. Minimal anasarca is noted. 3. Aortic atherosclerosis.  Aortic Atherosclerosis (ICD10-I70.0). Electronically Signed   By: Marijo Conception M.D.   On: 02/20/2020 12:39   DG Hip Unilat With Pelvis 2-3 Views Right  Result Date: 02/20/2020 CLINICAL DATA:  Right hip pain after fall last night. EXAM: DG HIP (WITH OR WITHOUT PELVIS) 2-3V RIGHT COMPARISON:  None. FINDINGS: There is no evidence of hip fracture or dislocation. There is no evidence of arthropathy or other focal bone abnormality. IMPRESSION: Negative. Electronically Signed   By: Marijo Conception M.D.   On: 02/20/2020 11:03    EKG: ordered  Assessment/Plan Active Problems:   Liver failure without  hepatic coma ([place])   Liver failure ([place])  (please populate well all problems here in Problem List. (For example, if patient is on BP meds at home and you resume or decide to hold them, it is a problem that needs to be her. Same for CAD, COPD, HLD and so on)  Acute on chronic liver failure secondary to alcohol abuse -Signs with decompensated liver function worsening coagulopathy, worsening of bilirubinemia and transaminitis -Calculated discriminant factor=49, and Child Plugh=11 (Life expectancy 1-3, which has been stable since summer 2021 as her Oncology's outpatient records showed) -GI consulted and will decide steroid use in this case  Acute on chronic coagulopathy -Vitamin K x1 recheck in tomorrow.  Symptomatic anemia -No clear symptoms signs of active GI bleed -On PPI, check iron study. -Already on B12 shots  Worsening of transaminitis and bilirubinemia -Check AFP  Fall -Suspect symptomatic anemia given worsening of anemia/pancytopenia -PRBC x1 -Endoscopy done last year showed no significant varices but signs of gastric portal hypertension presentation.  And patient has been on nonselective beta-blocker. -PT evaluation  CKD stage III -Euvolemic -VBG showed pH 7.4 07/02/42 -HCO3=16, will start bicarb p.o.  Pelvic/Rami fracture -IV Dilaudid for now then slowly transfer to p.o. pain  meds -PT evaluation  Alcohol abuse -No symptoms signs of active withdrawal, CIWA protocol with as needed benzos.   DVT prophylaxis: INR=2.2 Code Status: Full Code Family Communication: None at bedside Disposition Plan: Expect more than 2 midnight hospital stay to treat liver failure Consults called: [facility] Dr. Meda Coffee Admission status: Tele admit   Lequita Halt MD [facility] Pager [phone]  02/20/2020, 2:31 PM

## 2020-02-20 NOTE — ED Notes (Signed)
[place] is in place and functioning at this time.

## 2020-02-20 NOTE — ED Provider Notes (Signed)
[facility] EMERGENCY DEPARTMENT Provider Note   CSN: [id number] Arrival date & time: 02/20/20  7741     History Chief Complaint  Patient presents with  . Hip Pain    Dana Watkins is a 65 y.o. female.  He has a history of hypertension chronic thrombocytopenia CKD and cirrhosis.  She had a recent fall and was seen by Dr. Delilah Shan from [facility].  She was diagnosed with an inferior rami fracture.  Treated with narcotic pain medicine.  She said she last took her dose yesterday.  She said she fell yesterday and has worsening pain right hip area.  Contusion to right hand.  She says the pain medicine makes her too loopy so she does not like to take it.  The history is provided by the patient.  Fall This is a recurrent problem. The current episode started yesterday. The problem has not changed since onset.Pertinent negatives include no chest pain, no abdominal pain, no headaches and no shortness of breath. Associated symptoms comments: r pelvis pain. The symptoms are aggravated by walking. Nothing relieves the symptoms. She has tried rest for the symptoms. The treatment provided no relief.       Past Medical History:  Diagnosis Date  . Acute hypokalemia 11/26/2014  . Arthritis 05/15/2015  . Benign essential hypertension 05/15/2015  . C. difficile colitis   . CAD (coronary artery disease)    a. cath 04/2017: ""Diffuse, calcific CAD particularly in the distal RCA and proximal to mid LAD.  LAD disease is nonobstructive.  RCA disease is more severe but does not appear significant.  Given her lack of symptoms, would pursue medical therapy."  . CKD (chronic kidney disease), stage III ([place])   . Hypokalemia   . Migraine headache 05/15/2015  . Mild mitral regurgitation   . Pancytopenia ([place])   . Sepsis ([place]) 01/2017  . Thrombocytopenia ([place])    a. chronic thrombocytopenia ([facility] - remotely saw hematology).  . Vitamin D deficiency     Patient Active Problem List    Diagnosis Date Noted  . Hemothorax on left 08/24/2019  . Acute on chronic respiratory failure ([place]) 08/07/2019  . Acute coronary syndrome ([place]) 08/06/2019  . Anemia due to stage 3 chronic kidney disease ([place]) 04/16/2019  . Fever 02/21/2019  . Hypophosphatemia   . Recurrent Clostridioides difficile diarrhea 12/11/2018  . Weakness 12/09/2018  . Hyperbilirubinemia 10/31/2018  . C. difficile diarrhea 10/31/2018  . Other cirrhosis of liver ([place]) 10/31/2018  . Systolic murmur at cardiac apex 10/31/2018  . Sepsis ([place]) 10/30/2018  . Normocytic anemia 10/23/2018  . SIRS (systemic inflammatory response syndrome) ([place]) 10/16/2018  . Acute renal failure superimposed on stage 3 chronic kidney disease ([place]) 10/16/2018  . Acute lower UTI 10/16/2018  . Elevated troponin 10/16/2018  . Elevated d-dimer 10/16/2018  . UTI (urinary tract infection) 06/25/2018  . Community acquired pneumonia of right middle lobe of lung 06/09/2018  . AKI (acute kidney injury) ([place]) 06/09/2018  . GAD (generalized anxiety disorder) 11/06/2017  . Abnormal findings on diagnostic imaging of heart and coronary circulation   . Family history of coronary artery disease 05/09/2017  . DOE (dyspnea on exertion) 05/09/2017  . Coronary artery calcification seen on CT scan 03/08/2017  . Acute bronchitis 02/06/2017  . Dehydration   . Diarrhea 02/03/2017  . Nausea & vomiting 02/02/2017  . Arthritis 05/15/2015  . Benign essential hypertension 05/15/2015  . Migraine headache 05/15/2015  . Hypokalemia 11/26/2014  . Alkaline phosphatase elevation 11/26/2014  .  Abnormal weight gain 11/26/2014  . Chronic leukopenia 05/28/2014  . Anemia of chronic illness 05/28/2014  . Thrombocytopenia ([place]) 04/30/2013  . Elevated liver function tests 04/30/2013    Past Surgical History:  Procedure Laterality Date  . CESAREAN SECTION     24 years ago  . ESOPHAGOGASTRODUODENOSCOPY (EGD) WITH PROPOFOL N/A 06/13/2019   Procedure:  ESOPHAGOGASTRODUODENOSCOPY (EGD) WITH PROPOFOL;  Surgeon: Arta Silence, MD;  Location: [facility] ENDOSCOPY;  Service: Endoscopy;  Laterality: N/A;  administration of one bag of platelets   . LEFT HEART CATH AND CORONARY ANGIOGRAPHY N/A 05/12/2017   Procedure: LEFT HEART CATH AND CORONARY ANGIOGRAPHY;  Surgeon: [name] Booze, MD;  Location: [facility] CV LAB;  Service: Cardiovascular;  Laterality: N/A;  . THORACENTESIS N/A 08/28/2019   Procedure: Mathews Robinsons;  Surgeon: Candee Furbish, MD;  Location: [facility] ENDOSCOPY;  Service: Pulmonary;  Laterality: N/A;  . tonsils and adneoids     as a child  . ULTRASOUND GUIDANCE FOR VASCULAR ACCESS  05/12/2017   Procedure: Ultrasound Guidance For Vascular Access;  Surgeon: [name] Booze, MD;  Location: [facility] CV LAB;  Service: Cardiovascular;;     OB History   No obstetric history on file.     Family History  Problem Relation Age of Onset  . Pernicious anemia Maternal Grandfather   . Heart attack Maternal Grandfather   . Atrial fibrillation Mother   . Supraventricular tachycardia Father   . Heart disease Paternal Grandfather     Social History   Tobacco Use  . Smoking status: Former Smoker    Years: 2.00    Start date: 06/25/1968    Quit date: 05/16/2014    Years since quitting: 5.7  . Smokeless tobacco: Never Used  Vaping Use  . Vaping Use: Never used  Substance Use Topics  . Alcohol use: Yes    Comment: occ  . Drug use: No    Home Medications Prior to Admission medications   Medication Sig Start Date End Date Taking? Authorizing Provider  Ascorbic Acid (VITAMIN C) 1000 MG tablet Take 1,000 mg by mouth in the morning and at bedtime. Chewable    [provider]  B Complex Vitamins (B COMPLEX PO) Take by mouth.    [provider]  butalbital-acetaminophen-caffeine (FIORICET) 50-325-40 MG tablet Take 1 tablet by mouth every 6 (six) hours as needed for headache or migraine.  03/09/19   [provider]   carvedilol (COREG) 3.125 MG tablet Take 1 tablet (3.125 mg total) by mouth 2 (two) times daily with a meal. 12/25/19   [name], MD  Cyanocobalamin 1000 MCG SUBL Place 1 tablet (1,000 mcg total) under the tongue daily. 10/23/19   Nicholas Lose, MD  MAGNESIUM PO Take 400 mg by mouth daily.     [provider]  Multiple Vitamin (MULTIVITAMIN WITH MINERALS) TABS tablet Take 1 tablet by mouth daily. 06/29/18   Sheikh, Omair Latif, DO  ondansetron (ZOFRAN-ODT) 4 MG disintegrating tablet DISSOLVE 1 TABLET UNDER TONGUE AS NEEDED EVERY 8 HOURS 07/23/19   Nicholas Lose, MD  pantoprazole (PROTONIX) 40 MG tablet Take 40 mg by mouth as needed (for heartburn/acid reflux).    [provider]  potassium chloride SA (KLOR-CON) 20 MEQ tablet Take 1 tablet (20 mEq total) by mouth 2 (two) times daily. 08/14/19   Dixie Dials, MD  spironolactone (ALDACTONE) 25 MG tablet Take 25 mg by mouth in the morning and at bedtime.    [provider]    Allergies  Sulfa antibiotics  Review of Systems   Review of Systems  Constitutional: Negative for fever.  HENT: Negative for sore throat.   Eyes: Negative for visual disturbance.  Respiratory: Negative for shortness of breath.   Cardiovascular: Negative for chest pain.  Gastrointestinal: Negative for abdominal pain.  Genitourinary: Negative for dysuria.  Musculoskeletal: Negative for neck pain.  Skin: Positive for wound. Negative for rash.  Neurological: Negative for headaches.    Physical Exam Updated Vital Signs BP 138/68   Pulse 92   Temp 98 F (36.7 C) (Oral)   Resp 18   SpO2 96%   Physical Exam Vitals and nursing note reviewed.  Constitutional:      General: She is not in acute distress.    Appearance: Normal appearance. She is well-developed and well-nourished.  HENT:     Head: Normocephalic and atraumatic.  Eyes:     General: Scleral icterus present.     Conjunctiva/sclera: Conjunctivae normal.   Cardiovascular:     Rate and Rhythm: Normal rate and regular rhythm.     Heart sounds: No murmur heard.   Pulmonary:     Effort: Pulmonary effort is normal. No respiratory distress.     Breath sounds: Normal breath sounds.  Abdominal:     Palpations: Abdomen is soft.     Tenderness: There is no abdominal tenderness. There is no guarding or rebound.  Musculoskeletal:        General: Tenderness present. No deformity or edema.     Cervical back: Neck supple.     Right lower leg: No edema.     Left lower leg: No edema.     Comments: Vague tenderness through right hip area and buttock.  Contusion to right dorsum of hand.  Small abrasion right knee nontender  Skin:    General: Skin is warm and dry.     Capillary Refill: Capillary refill takes less than 2 seconds.     Coloration: Skin is jaundiced.  Neurological:     General: No focal deficit present.     Mental Status: She is oriented to person, place, and time.     Sensory: No sensory deficit.     Motor: No weakness.  Psychiatric:        Mood and Affect: Mood and affect normal.     ED Results / Procedures / Treatments   Labs (all labs ordered are listed, but only abnormal results are displayed) Labs Reviewed  COMPREHENSIVE METABOLIC PANEL - Abnormal; Notable for the following components:      Result Value   Potassium 3.2 (*)    Chloride 113 (*)    CO2 16 (*)    Glucose, Bld 105 (*)    Creatinine, Ser 1.61 (*)    Total Protein 5.5 (*)    Albumin 2.2 (*)    AST 123 (*)    ALT 52 (*)    Alkaline Phosphatase 261 (*)    Total Bilirubin 7.7 (*)    GFR, Estimated 36 (*)    All other components within normal limits  CBC WITH DIFFERENTIAL/PLATELET - Abnormal; Notable for the following components:   RBC 2.34 (*)    Hemoglobin 7.6 (*)    HCT 23.5 (*)    MCV 100.4 (*)    RDW 18.1 (*)    Platelets 42 (*)    All other components within normal limits  PROTIME-INR - Abnormal; Notable for the following components:   Prothrombin  Time 24.0 (*)    INR 2.2 (*)  All other components within normal limits  AMMONIA - Abnormal; Notable for the following components:   Ammonia 45 (*)    All other components within normal limits  IRON AND TIBC - Abnormal; Notable for the following components:   TIBC 232 (*)    Saturation Ratios 56 (*)    All other components within normal limits  URINALYSIS, ROUTINE W REFLEX MICROSCOPIC - Abnormal; Notable for the following components:   APPearance CLOUDY (*)    Glucose, UA 100 (*)    Hgb urine dipstick LARGE (*)    Protein, ur 30 (*)    Leukocytes,Ua SMALL (*)    All other components within normal limits  COMPREHENSIVE METABOLIC PANEL - Abnormal; Notable for the following components:   Potassium 3.1 (*)    Chloride 113 (*)    CO2 15 (*)    Creatinine, Ser 1.51 (*)    Total Protein 5.1 (*)    Albumin 2.1 (*)    AST 117 (*)    ALT 52 (*)    Alkaline Phosphatase 237 (*)    Total Bilirubin 7.5 (*)    GFR, Estimated 38 (*)    All other components within normal limits  PHOSPHORUS - Abnormal; Notable for the following components:   Phosphorus 2.4 (*)    All other components within normal limits  CBC - Abnormal; Notable for the following components:   RBC 2.27 (*)    Hemoglobin 7.1 (*)    HCT 22.7 (*)    RDW 18.1 (*)    Platelets 42 (*)    All other components within normal limits  URINALYSIS, MICROSCOPIC (REFLEX) - Abnormal; Notable for the following components:   Bacteria, UA MANY (*)    All other components within normal limits  I-STAT VENOUS BLOOD GAS, ED - Abnormal; Notable for the following components:   pH, Ven 7.463 (*)    pCO2, Ven 21.3 (*)    Bicarbonate 15.3 (*)    TCO2 16 (*)    Acid-base deficit 8.0 (*)    Potassium 3.2 (*)    HCT 20.0 (*)    Hemoglobin 6.8 (*)    All other components within normal limits  SARS CORONAVIRUS 2 (TAT 6-24 HRS)  FERRITIN  MAGNESIUM  BLOOD GAS, VENOUS  ETHANOL  PROTIME-INR  ALPHA FETOPROTEIN, AMNIOTIC  COMPREHENSIVE  METABOLIC PANEL  CBC  TYPE AND SCREEN  PREPARE RBC (CROSSMATCH)    EKG None  Radiology DG Chest 1 View  Result Date: 02/20/2020 CLINICAL DATA:  Fall EXAM: CHEST  1 VIEW COMPARISON:  10/10/2019 FINDINGS: Cardiac enlargement with vascular congestion. Negative for edema or effusion. Mild left lower lobe airspace disease most likely atelectasis. No acute skeletal abnormality. Chronic left rib fractures. IMPRESSION: Cardiac enlargement and mild vascular congestion. Negative for edema. Mild left lower lobe atelectasis. Electronically Signed   By: Franchot Gallo M.D.   On: 02/20/2020 14:46   CT PELVIS WO CONTRAST  Result Date: 02/20/2020 CLINICAL DATA:  Right hip pain after fall 2 days ago. EXAM: CT PELVIS WITHOUT CONTRAST TECHNIQUE: Multidetector CT imaging of the pelvis was performed following the standard protocol without intravenous contrast. COMPARISON:  June 25, 2018. FINDINGS: Urinary Tract:  No abnormality visualized. Bowel:  Unremarkable visualized pelvic bowel loops. Vascular/Lymphatic: Atherosclerosis of abdominal aorta is noted. No adenopathy is noted. Reproductive:  No mass or other significant abnormality Other: Mild ascites is noted in the pelvis. Minimal anasarca is noted. No hernia is noted. Musculoskeletal: Minimally displaced acute fractures are seen involving  the right inferior and superior pubic rami. The hip joints are unremarkable. IMPRESSION: 1. Minimally displaced acute fractures are seen involving the right inferior and superior pubic rami. 2. Mild ascites is noted in the pelvis. Minimal anasarca is noted. 3. Aortic atherosclerosis. Aortic Atherosclerosis (ICD10-I70.0). Electronically Signed   By: Marijo Conception M.D.   On: 02/20/2020 12:39   DG Hip Unilat With Pelvis 2-3 Views Right  Result Date: 02/20/2020 CLINICAL DATA:  Right hip pain after fall last night. EXAM: DG HIP (WITH OR WITHOUT PELVIS) 2-3V RIGHT COMPARISON:  None. FINDINGS: There is no evidence of hip fracture or  dislocation. There is no evidence of arthropathy or other focal bone abnormality. IMPRESSION: Negative. Electronically Signed   By: Marijo Conception M.D.   On: 02/20/2020 11:03    Procedures .Critical Care Performed by: Hayden Rasmussen, MD Authorized by: Hayden Rasmussen, MD   Critical care provider statement:    Critical care time (minutes):  45   Critical care time was exclusive of:  Separately billable procedures and treating other patients   Critical care was necessary to treat or prevent imminent or life-threatening deterioration of the following conditions:  Circulatory failure and hepatic failure   Critical care was time spent personally by me on the following activities:  Discussions with consultants, evaluation of patient's response to treatment, examination of patient, ordering and performing treatments and interventions, ordering and review of laboratory studies, ordering and review of radiographic studies, pulse oximetry, re-evaluation of patient's condition, obtaining history from patient or surrogate and review of old charts     Medications Ordered in ED Medications  phytonadione (VITAMIN K) tablet 10 mg (has no administration in time range)  0.9 %  sodium chloride infusion (Manually program via Guardrails IV Fluids) (has no administration in time range)  butalbital-acetaminophen-caffeine (FIORICET) 50-325-40 MG per tablet 1 tablet (has no administration in time range)  carvedilol (COREG) tablet 3.125 mg (has no administration in time range)  spironolactone (ALDACTONE) tablet 25 mg (has no administration in time range)  ondansetron (ZOFRAN-ODT) disintegrating tablet 4 mg (has no administration in time range)  pantoprazole (PROTONIX) EC tablet 40 mg (has no administration in time range)  Cyanocobalamin SUBL 1,000 mcg (has no administration in time range)  ascorbic acid (VITAMIN C) tablet 1,000 mg (has no administration in time range)  Magnesium TABS 400 mg (has no  administration in time range)  potassium chloride SA (KLOR-CON) CR tablet 20 mEq (has no administration in time range)  HYDROmorphone (DILAUDID) injection 0.5-1 mg (has no administration in time range)  LORazepam (ATIVAN) tablet 1-4 mg (has no administration in time range)    Or  LORazepam (ATIVAN) injection 1-4 mg (has no administration in time range)  thiamine tablet 100 mg (has no administration in time range)    Or  thiamine (B-1) injection 100 mg (has no administration in time range)  folic acid (FOLVITE) tablet 1 mg (has no administration in time range)  multivitamin with minerals tablet 1 tablet (has no administration in time range)  sodium bicarbonate tablet 650 mg (has no administration in time range)    ED Course  I have reviewed the triage vital signs and the nursing notes.  Pertinent labs & imaging results that were available during my care of the patient were reviewed by me and considered in my medical decision making (see chart for details).  Clinical Course as of 02/20/20 1709  Wed Feb 20, 2020  1235 Lab work showing a lot  of abnormal readings most of which have been there in the past.  Bilirubin slightly worse than baseline.  Hemoglobin slightly worse than baseline.  Ammonia up but has been higher. [MB]  1308 Discussed with orthopedics PA Jeffries who reviewed the patient's imaging.  He agrees this is nonoperative, weightbearing as tolerated. [MB]  4142 I am concern for this patient being discharged back to home.  She lives alone.  Is more anemic than her baseline and has fairly unchanged baseline abnormal LFTs and coagulopathy.  Thrombocytopenic slightly worse than baseline.  Has fallen twice now.  High risk for another fall and does not have adequate pain control or steady gait.  Discussed with [facility] hospitalist Dr. Roosevelt Locks who will evaluate the patient for admission. [MB]    Clinical Course User Index [MB] Hayden Rasmussen, MD   MDM Rules/Calculators/A&P                          This patient complains of generalized weakness fall right hip pain; this involves an extensive number of treatment Options and is a complaint that carries with it a high risk of complications and Morbidity. The differential includes hip fracture, pelvic fracture, contusion, metabolic derangement, medication side effect, anemia  I ordered, reviewed and interpreted labs, which included CBC with normal white count, hemoglobin low at 7.1 lower than her baseline of 8.5, platelets critically low at 42 down from baseline 70s. Chemistries with low potassium low bicarb, elevated pain, significantly elevated LFTs with bilirubin of 7.5, ammonia mildly elevated, PT/INR elevated at 2.2 I ordered medication transfusion of blood products I ordered imaging studies which included pelvis and right hip x-ray, CT pelvis and I independently    visualized and interpreted imaging which showed superior and inferior right pelvic rami fractures Previous records obtained and reviewed oncology for her anemia and thrombocytopenia but do not see any recent GI visits I consulted [facility] hospitalist Dr. Roosevelt Locks and discussed lab and imaging findings  Critical Interventions: Evaluation work-up of patient's with her significant anemia thrombocytopenia coagulopathy. She will need transfusion of cells along with likely reconnection with GI regarding her liver failure and coagulopathy. Will also need physical therapy and possibly placement in skilled nursing facility.  After the interventions stated above, I reevaluated the patient and found patient still to be very somnolent she however is oriented and answers questions appropriately. She is in agreement with plan for admission to the hospital for further work-up and management.   Final Clinical Impression(s) / ED Diagnoses Final diagnoses:  Closed nondisplaced fracture of pelvis, unspecified part of pelvis, initial encounter ([place])  Hepatic cirrhosis, unspecified hepatic  cirrhosis type, unspecified whether ascites present ([place])  Symptomatic anemia  Thrombocytopenia ([place])  Fall, initial encounter    Rx / DC Orders ED Discharge Orders    None       Hayden Rasmussen, MD 02/20/20 1720

## 2020-02-20 NOTE — Evaluation (Signed)
Physical Therapy Evaluation Patient Details Name: Dana Watkins MRN: [id number] DOB: 1955/10/20 Today's Date: 02/20/2020   History of Present Illness  Pt is a 65 y/o female presenting after fall and R hip pain. Pt with recent fall on black ice and sustained R pubic rami fx in which she went to an ortho MD. Imaging of R hip negative and imaging of pelvis continued to show R pubic rami fx. Pt also with liver failure. PMH includes  liver cirrhosis secondary to alcohol abuse, chronic thrombocytopenia secondary to cirrhosis and remote ITP, CKD stage III, hypokalemia.  Clinical Impression  Pt admitted secondary to problem above with deficits below. RN cleared for pt to be seen. Pt requiring mod to max A for bed mobility and transfers this session. Pt limited secondary to pain. Pt reports that she has had increased difficulty with mobility at home. Feel she would benefit from SNF level therapies at d/c. Will continue to follow acutely.   Of note: Upon sitting, pt's blue bracelet hit one of her previous scabs on R hand that she got from previous fall. Part of scab fell off and began to bleed. Notified RN and gauze placed on pt's R hand.     Follow Up Recommendations SNF;Supervision/Assistance - 24 hour    Equipment Recommendations  Wheelchair cushion (measurements PT);Wheelchair (measurements PT)    Recommendations for Other Services       Precautions / Restrictions Precautions Precautions: Fall Restrictions Weight Bearing Restrictions: No Other Position/Activity Restrictions: Per pt, WBAT      Mobility  Bed Mobility Overal bed mobility: Needs Assistance Bed Mobility: Supine to Sit;Sit to Supine     Supine to sit: Max assist Sit to supine: Mod assist   General bed mobility comments: Max A for trunk and LE assist to come to sitting. Increased time secondary to pain. Mod A for LE assist for return to supine.    Transfers Overall transfer level: Needs assistance Equipment used:  None Transfers: Sit to/from Stand Sit to Stand: Max assist         General transfer comment: Max A for lift assist and steadying to stand. Pt unable to tolerate for prolonged period secondary to pain.  Ambulation/Gait                Stairs            Wheelchair Mobility    Modified Rankin (Stroke Patients Only)       Balance Overall balance assessment: Needs assistance Sitting-balance support: No upper extremity supported;Feet supported Sitting balance-Leahy Scale: Fair     Standing balance support: Bilateral upper extremity supported Standing balance-Leahy Scale: Zero Standing balance comment: max A to maintain standing balance                             Pertinent Vitals/Pain Pain Assessment: Faces Faces Pain Scale: Hurts whole lot Pain Location: pelivis and R hip Pain Descriptors / Indicators: Guarding;Grimacing Pain Intervention(s): Limited activity within patient's tolerance;Monitored during session;Repositioned    Home Living Family/patient expects to be discharged to:: Private residence Living Arrangements: Alone Available Help at Discharge: Family;Friend(s);Available 24 hours/day Type of Home: Apartment Home Access: Level entry     Home Layout: One level Home Equipment: Walker - 2 wheels Additional Comments: Reports since initial fall has had family/friend support for 24/7.    Prior Function Level of Independence: Needs assistance   Gait / Transfers Assistance Needed: Prior to initial fall,  pt was independent. After falling on black ice, pt has been using RW, but has reported increased difficulty.  ADL's / Homemaking Assistance Needed: Reports since falling, has required assist with sponge bathing and dressing. Has had increased difficulty performing ADL tasks.        Hand Dominance        Extremity/Trunk Assessment   Upper Extremity Assessment Upper Extremity Assessment: Generalized weakness;LUE deficits/detail;RUE  deficits/detail RUE Deficits / Details: Bruising noted in R hand and with multiple scabbed places. LUE Deficits / Details: Pt with scabbed places on L hand from falls.    Lower Extremity Assessment Lower Extremity Assessment: RLE deficits/detail RLE Deficits / Details: Limited ROM secondary to pain.    Cervical / Trunk Assessment Cervical / Trunk Assessment: Kyphotic  Communication   Communication: No difficulties  Cognition Arousal/Alertness: Suspect due to medications Behavior During Therapy: WFL for tasks assessed/performed Overall Cognitive Status: No family/caregiver present to determine baseline cognitive functioning                                 General Comments: Pt initially lethargic, however, with change in position became more alert. Slow to respond to questions.      General Comments      Exercises     Assessment/Plan    PT Assessment Patient needs continued PT services  PT Problem List Decreased strength;Decreased activity tolerance;Decreased balance;Decreased mobility;Decreased range of motion;Decreased cognition;Decreased safety awareness;Decreased knowledge of use of DME;Pain       PT Treatment Interventions DME instruction;Gait training;Functional mobility training;Therapeutic exercise;Therapeutic activities;Balance training;Patient/family education    PT Goals (Current goals can be found in the Care Plan section)  Acute Rehab PT Goals Patient Stated Goal: to be able to walk better PT Goal Formulation: With patient Time For Goal Achievement: 03/05/20 Potential to Achieve Goals: Good    Frequency Min 2X/week   Barriers to discharge        Co-evaluation               AM-PAC PT "6 Clicks" Mobility  Outcome Measure Help needed turning from your back to your side while in a flat bed without using bedrails?: A Lot Help needed moving from lying on your back to sitting on the side of a flat bed without using bedrails?: A Lot Help  needed moving to and from a bed to a chair (including a wheelchair)?: A Lot Help needed standing up from a chair using your arms (e.g., wheelchair or bedside chair)?: A Lot Help needed to walk in hospital room?: Total Help needed climbing 3-5 steps with a railing? : Total 6 Click Score: 10    End of Session Equipment Utilized During Treatment: Gait belt Activity Tolerance: Patient limited by pain Patient left: in bed;with call bell/phone within reach (on stretcher in ED) Nurse Communication: Mobility status PT Visit Diagnosis: Unsteadiness on feet (R26.81);Muscle weakness (generalized) (M62.81);History of falling (Z91.81);Pain;Difficulty in walking, not elsewhere classified (R26.2) Pain - Right/Left: Right Pain - part of body:  (pelvis)    Time: 8921-1941 PT Time Calculation (min) (ACUTE ONLY): 23 min   Charges:   PT Evaluation $PT Eval Moderate Complexity: 1 Mod PT Treatments $Therapeutic Activity: 8-22 mins        Lou Miner, DPT  Acute Rehabilitation Services  Pager: [phone] Office: [phone]   Rudean Hitt 02/20/2020, 4:47 PM

## 2020-02-20 NOTE — ED Notes (Signed)
Dinner Tray Ordered @ 1725. 

## 2020-02-21 ENCOUNTER — Inpatient Hospital Stay (HOSPITAL_COMMUNITY): Payer: Medicare HMO

## 2020-02-21 DIAGNOSIS — K7030 Alcoholic cirrhosis of liver without ascites: Secondary | ICD-10-CM

## 2020-02-21 LAB — CBC
HCT: 23.4 % — ABNORMAL LOW (ref 36.0–46.0)
Hemoglobin: 7.4 g/dL — ABNORMAL LOW (ref 12.0–15.0)
MCH: 30.6 pg (ref 26.0–34.0)
MCHC: 31.6 g/dL (ref 30.0–36.0)
MCV: 96.7 fL (ref 80.0–100.0)
Platelets: 43 10*3/uL — ABNORMAL LOW (ref 150–400)
RBC: 2.42 MIL/uL — ABNORMAL LOW (ref 3.87–5.11)
RDW: 18.9 % — ABNORMAL HIGH (ref 11.5–15.5)
WBC: 6.5 10*3/uL (ref 4.0–10.5)
nRBC: 0 % (ref 0.0–0.2)

## 2020-02-21 LAB — COMPREHENSIVE METABOLIC PANEL
ALT: 48 U/L — ABNORMAL HIGH (ref 0–44)
AST: 110 U/L — ABNORMAL HIGH (ref 15–41)
Albumin: 2 g/dL — ABNORMAL LOW (ref 3.5–5.0)
Alkaline Phosphatase: 240 U/L — ABNORMAL HIGH (ref 38–126)
Anion gap: 8 (ref 5–15)
BUN: 19 mg/dL (ref 8–23)
CO2: 17 mmol/L — ABNORMAL LOW (ref 22–32)
Calcium: 9.3 mg/dL (ref 8.9–10.3)
Chloride: 113 mmol/L — ABNORMAL HIGH (ref 98–111)
Creatinine, Ser: 1.74 mg/dL — ABNORMAL HIGH (ref 0.44–1.00)
GFR, Estimated: 32 mL/min — ABNORMAL LOW (ref >60)
Glucose, Bld: 95 mg/dL (ref 70–99)
Potassium: 3.4 mmol/L — ABNORMAL LOW (ref 3.5–5.1)
Sodium: 138 mmol/L (ref 135–145)
Total Bilirubin: 7.7 mg/dL — ABNORMAL HIGH (ref 0.3–1.2)
Total Protein: 4.8 g/dL — ABNORMAL LOW (ref 6.5–8.1)

## 2020-02-21 LAB — PROTIME-INR
INR: 2.4 — ABNORMAL HIGH (ref 0.8–1.2)
Prothrombin Time: 25.5 seconds — ABNORMAL HIGH (ref 11.4–15.2)

## 2020-02-21 LAB — BLOOD GAS, VENOUS
Acid-base deficit: 6 mmol/L — ABNORMAL HIGH (ref 0.0–2.0)
Bicarbonate: 17.9 mmol/L — ABNORMAL LOW (ref 20.0–28.0)
FIO2: 21
O2 Saturation: 92.2 %
Patient temperature: 37
pCO2, Ven: 29.6 mmHg — ABNORMAL LOW (ref 44.0–60.0)
pH, Ven: 7.4 (ref 7.250–7.430)
pO2, Ven: 64.7 mmHg — ABNORMAL HIGH (ref 32.0–45.0)

## 2020-02-21 LAB — ETHANOL: Alcohol, Ethyl (B): <10 mg/dL (ref <10)

## 2020-02-21 MED ORDER — PREDNISOLONE 5 MG PO TABS
40.0000 mg | ORAL_TABLET | Freq: Every day | ORAL | Status: DC
  Administered 2020-02-21 – 2020-02-28 (×8): 40 mg via ORAL
  Filled 2020-02-21 (×8): qty 8

## 2020-02-21 MED ORDER — PHYTONADIONE 5 MG PO TABS
5.0000 mg | ORAL_TABLET | Freq: Once | ORAL | Status: AC
  Administered 2020-02-21: 5 mg via ORAL
  Filled 2020-02-21: qty 1

## 2020-02-21 MED ORDER — ALBUMIN HUMAN 25 % IV SOLN
12.5000 g | Freq: Four times a day (QID) | INTRAVENOUS | Status: AC
  Administered 2020-02-21 – 2020-02-22 (×4): 12.5 g via INTRAVENOUS
  Filled 2020-02-21 (×5): qty 50

## 2020-02-21 MED ORDER — K PHOS MONO-SOD PHOS DI & MONO 155-852-130 MG PO TABS
250.0000 mg | ORAL_TABLET | Freq: Two times a day (BID) | ORAL | Status: AC
  Administered 2020-02-21 – 2020-02-23 (×4): 250 mg via ORAL
  Filled 2020-02-21 (×4): qty 1

## 2020-02-21 MED ORDER — LACTULOSE 10 GM/15ML PO SOLN
30.0000 g | Freq: Two times a day (BID) | ORAL | Status: DC
  Administered 2020-02-21 – 2020-02-22 (×4): 30 g via ORAL
  Filled 2020-02-21 (×4): qty 45

## 2020-02-21 MED ORDER — SODIUM CHLORIDE 0.9 % IV SOLN
200.0000 mg | Freq: Once | INTRAVENOUS | Status: AC
  Administered 2020-02-21: 200 mg via INTRAVENOUS
  Filled 2020-02-21: qty 40

## 2020-02-21 MED ORDER — SODIUM CHLORIDE 0.9 % IV SOLN
100.0000 mg | Freq: Every day | INTRAVENOUS | Status: AC
  Administered 2020-02-22 – 2020-02-23 (×2): 100 mg via INTRAVENOUS
  Filled 2020-02-21 (×2): qty 20

## 2020-02-21 MED ORDER — PANTOPRAZOLE SODIUM 40 MG IV SOLR
40.0000 mg | Freq: Two times a day (BID) | INTRAVENOUS | Status: DC
  Administered 2020-02-21 – 2020-02-28 (×15): 40 mg via INTRAVENOUS
  Filled 2020-02-21 (×15): qty 40

## 2020-02-21 NOTE — Progress Notes (Signed)
PROGRESS NOTE                                                                             PROGRESS NOTE                                                                                                                                                                                                             Patient Demographics:    Dana Watkins, is a 65 y.o. female, DOB - 12/10/1955, VOP:[id number]  Outpatient Primary MD for the patient is Lawerance Cruel, MD    LOS - 1  Admit date - 02/20/2020    Chief Complaint  Patient presents with  . Hip Pain       Brief Narrative     HPI: Dana Watkins is a 65 y.o. female with medical history significant of liver cirrhosis secondary to alcohol abuse, chronic thrombocytopenia secondary to cirrhosis and remote ITP, CKD stage III, hypokalemia, presented with fall and hip pain.  Patient reported that he stepped on black ice this Monday and fell down in the parking lot.  She went to see orthopedic surgery who did outpatient imaging study showed pubic Rami fracture, stable, and patient sent home with p.o. oxycodone.  Despite taking oxycodone, patient continued to feel frequent breakthrough pain of right hip and fell down another 2-3 times since yesterday.  Denies any head injury, loss of consciousness.  Patient denied any black tarry stool or blood in the stool.  No abdominal pain.  No fever.  Patient admitted used to be a heavy drinker, but " only drinks a glass of wine here and there" and she admitted last drink was last week.  Patient also reported she has lost follow-up with GI since summer 2021, and she has been following with oncology for B12 shots.  ED Course: Worsening of liver function, CT pelvic showed stable right pubic Rami fracture.    Subjective:    Dana Watkins today confused, reports pain in the hip area, denies nausea, vomiting or abdominal pain.,  No shortness of breath or cough.       Assessment  & Plan :    Active Problems:   Symptomatic anemia   Hepatic cirrhosis (HCC)   Liver failure without hepatic coma (HCC)   Liver failure (HCC)   Decompensated liver cirrhosis due to alcohol abuse, with encephalopathy and coagulopathy and alcoholic hepatitis . -GI input greatly appreciated, total bilirubin elevated at 7.7, AST at 110, ALT at 48, alk phos at 240 -With evidence of coagulopathy INR was 2.4. -Continue with lactulose, monitor ammonia level closely -Continue with Protonix IV twice daily -Started on prednisolone for alcoholic hepatitis -Continue to monitor LFTs -Continue with rifaximin -Give 1 dose of vitamin K. -Check abdominal ultrasound  Acute on chronic anemia -No evidence of significant GI bleed, continue with Protonix.  Thrombocytopenia -Due to above, on SCD for DVT prophylaxis  CKD stage III -HCO3=16, will start bicarb p.o. -At baseline, monitor closely as on Aldactone  Hypokalemia/hypophosphatemia -We will replete, recheck in a.m.  Pelvic/Rami fracture -PT evaluation  Alcohol abuse -No symptoms signs of active withdrawal, CIWA protocol with as needed benzos.  COVID 19 infection -She is vaccinated and boosted, asymptomatic, no hypoxia, will give remdesivir x3 days.  SpO2: 95 %  Recent Labs  Lab 02/20/20 1126 02/20/20 1324 02/20/20 1413 02/21/20 0608  WBC 6.5  --  6.0 6.5  PLT 42*  --  42* 43*  AST 123*  --  117* 110*  ALT 52*  --  52* 48*  ALKPHOS 261*  --  237* 240*  BILITOT 7.7*  --  7.5* 7.7*  ALBUMIN 2.2*  --  2.1* 2.0*  INR 2.2*  --   --  2.4*  SARSCOV2NAA  --  POSITIVE*  --   --        ABG     Component Value Date/Time   HCO3 17.9 (L) 02/21/2020 0608   TCO2 16 (L) 02/20/2020 1440   ACIDBASEDEF 6.0 (H) 02/21/2020 0608   O2SAT 92.2 02/21/2020 0608           Condition - Extremely Guarded  Family Communication  :  Daughter by phone  Code Status :  Full  Consults  :  GI consult  Disposition  Plan  :    Status is: Inpatient  Remains inpatient appropriate because:IV treatments appropriate due to intensity of illness or inability to take PO   Dispo: The patient is from: Home              Anticipated d/c is to: Home              Anticipated d/c date is: 3 days              Patient currently is not medically stable to d/c.   Difficult to place patient No      DVT Prophylaxis  : SCDs   Lab Results  Component Value Date   PLT 43 (L) 02/21/2020    Diet :  Diet Order            Diet NPO time specified Except for: Ice Chips  Diet effective now                  Inpatient Medications  Scheduled Meds: . vitamin C  1,000 mg Oral BID  . carvedilol  3.125 mg Oral BID WC  . folic acid  1 mg Oral Daily  . lactulose  30 g Oral BID  . magnesium oxide  400 mg Oral  Daily  . multivitamin with minerals  1 tablet Oral Daily  . pantoprazole (PROTONIX) IV  40 mg Intravenous Q12H  . phytonadione  10 mg Oral Once  . potassium chloride SA  20 mEq Oral BID  . prednisoLONE  40 mg Oral Daily  . sodium bicarbonate  650 mg Oral BID  . spironolactone  25 mg Oral Daily  . thiamine  100 mg Oral Daily   Or  . thiamine  100 mg Intravenous Daily   Continuous Infusions: . albumin human 12.5 g (02/21/20 0846)  . [START ON 02/22/2020] remdesivir 100 mg in NS 100 mL     PRN Meds:.LORazepam **OR** LORazepam  Antibiotics  :    Anti-infectives (From admission, onward)   Start     Dose/Rate Route Frequency Ordered Stop   02/22/20 1000  remdesivir 100 mg in sodium chloride 0.9 % 100 mL IVPB        100 mg 200 mL/hr over 30 Minutes Intravenous Daily 02/21/20 1050 02/24/20 0959   02/21/20 1145  remdesivir 200 mg in sodium chloride 0.9% 250 mL IVPB        200 mg 580 mL/hr over 30 Minutes Intravenous Once 02/21/20 1050 02/21/20 1429       Denisha Hoel M.D on 02/21/2020 at 2:50 PM  To page go to [email or web address]   [facility] -  Office  [phone]      Objective:    Vitals:   02/20/20 2347 02/21/20 0200 02/21/20 0353 02/21/20 1419  BP: 133/69 127/67 132/71 (!) 143/71  Pulse: 89 90 93 (!) 102  Resp: 14 16 15 16   Temp: 98.3 F (36.8 C) 97.6 F (36.4 C) 97.7 F (36.5 C) 97.7 F (36.5 C)  TempSrc: Oral Oral Axillary Oral  SpO2: 97% 96% 93% 95%  Weight:      Height:        Wt Readings from Last 3 Encounters:  02/20/20 73.9 kg  02/04/20 66.8 kg  12/25/19 65.8 kg     Intake/Output Summary (Last 24 hours) at 02/21/2020 1450 Last data filed at 02/21/2020 0300 Gross per 24 hour  Intake 1039.58 ml  Output -  Net 1039.58 ml     Physical Exam  Awake Alert, even though she is alert x3, she is slow to respond, confused with impaired cognition and insight.   Symmetrical Chest wall movement, Good air movement bilaterally, CTAB RRR,No Gallops,Rubs or new Murmurs, No Parasternal Heave +ve B.Sounds, Abd Soft, No tenderness,  No rebound - guarding or rigidity. No Cyanosis, Clubbing or edema, No new Rash or bruise     Data Review:    CBC Recent Labs  Lab 02/20/20 1126 02/20/20 1413 02/20/20 1440 02/21/20 0608  WBC 6.5 6.0  --  6.5  HGB 7.6* 7.1* 6.8* 7.4*  HCT 23.5* 22.7* 20.0* 23.4*  PLT 42* 42*  --  43*  MCV 100.4* 100.0  --  96.7  MCH 32.5 31.3  --  30.6  MCHC 32.3 31.3  --  31.6  RDW 18.1* 18.1*  --  18.9*  LYMPHSABS 0.8  --   --   --   MONOABS 0.8  --   --   --   EOSABS 0.1  --   --   --   BASOSABS 0.0  --   --   --     Recent Labs  Lab 02/20/20 1126 02/20/20 1127 02/20/20 1413 02/20/20 1440 02/21/20 0608  NA 139  --  138 142 138  K 3.2*  --  3.1* 3.2* 3.4*  CL 113*  --  113*  --  113*  CO2 16*  --  15*  --  17*  GLUCOSE 105*  --  90  --  95  BUN 19  --  18  --  19  CREATININE 1.61*  --  1.51*  --  1.74*  CALCIUM 9.4  --  9.0  --  9.3  AST 123*  --  117*  --  110*  ALT 52*  --  52*  --  48*  ALKPHOS 261*  --  237*  --  240*  BILITOT 7.7*  --  7.5*  --  7.7*  ALBUMIN 2.2*  --  2.1*  --  2.0*  MG  --   --  1.9   --   --   INR 2.2*  --   --   --  2.4*  AMMONIA  --  45*  --   --   --     ------------------------------------------------------------------------------------------------------------------ No results for input(s): CHOL, HDL, LDLCALC, TRIG, CHOLHDL, LDLDIRECT in the last 72 hours.  No results found for: HGBA1C ------------------------------------------------------------------------------------------------------------------ No results for input(s): TSH, T4TOTAL, T3FREE, THYROIDAB in the last 72 hours.  Invalid input(s): FREET3  Cardiac Enzymes No results for input(s): CKMB, TROPONINI, MYOGLOBIN in the last 168 hours.  Invalid input(s): CK ------------------------------------------------------------------------------------------------------------------    Component Value Date/Time   BNP 153.2 (H) 08/07/2019 0030    Micro Results Recent Results (from the past 240 hour(s))  SARS CORONAVIRUS 2 (TAT 6-24 HRS) Nasopharyngeal Nasopharyngeal Swab     Status: Abnormal   Collection Time: 02/20/20  1:24 PM   Specimen: Nasopharyngeal Swab  Result Value Ref Range Status   SARS Coronavirus 2 POSITIVE (A) NEGATIVE Final    Comment: (NOTE) SARS-CoV-2 target nucleic acids are DETECTED.  The SARS-CoV-2 RNA is generally detectable in upper and lower respiratory specimens during the acute phase of infection. Positive results are indicative of the presence of SARS-CoV-2 RNA. Clinical correlation with patient history and other diagnostic information is  necessary to determine patient infection status. Positive results do not rule out bacterial infection or co-infection with other viruses.  The expected result is Negative.  Fact Sheet for Patients: SugarRoll.be  Fact Sheet for Healthcare Providers: https://www.woods-mathews.com/  This test is not yet approved or cleared by the [place] FDA and  has been authorized for detection and/or diagnosis of  SARS-CoV-2 by FDA under an Emergency Use Authorization (EUA). This EUA will remain  in effect (meaning this test can be used) for the duration of the COVID-19 declaration under Section 564(b)(1) of the Act, 21 U. S.C. section 360bbb-3(b)(1), unless the authorization is terminated or revoked sooner.   Performed at [facility], [place] [address], [place], [place] [address]     Radiology Reports DG Chest 1 View  Result Date: 02/20/2020 CLINICAL DATA:  Fall EXAM: CHEST  1 VIEW COMPARISON:  10/10/2019 FINDINGS: Cardiac enlargement with vascular congestion. Negative for edema or effusion. Mild left lower lobe airspace disease most likely atelectasis. No acute skeletal abnormality. Chronic left rib fractures. IMPRESSION: Cardiac enlargement and mild vascular congestion. Negative for edema. Mild left lower lobe atelectasis. Electronically Signed   By: Franchot Gallo M.D.   On: 02/20/2020 14:46   CT PELVIS WO CONTRAST  Result Date: 02/20/2020 CLINICAL DATA:  Right hip pain after fall 2 days ago. EXAM: CT PELVIS WITHOUT CONTRAST TECHNIQUE: Multidetector CT imaging of the pelvis was performed following the standard protocol without  intravenous contrast. COMPARISON:  June 25, 2018. FINDINGS: Urinary Tract:  No abnormality visualized. Bowel:  Unremarkable visualized pelvic bowel loops. Vascular/Lymphatic: Atherosclerosis of abdominal aorta is noted. No adenopathy is noted. Reproductive:  No mass or other significant abnormality Other: Mild ascites is noted in the pelvis. Minimal anasarca is noted. No hernia is noted. Musculoskeletal: Minimally displaced acute fractures are seen involving the right inferior and superior pubic rami. The hip joints are unremarkable. IMPRESSION: 1. Minimally displaced acute fractures are seen involving the right inferior and superior pubic rami. 2. Mild ascites is noted in the pelvis. Minimal anasarca is noted. 3. Aortic atherosclerosis. Aortic Atherosclerosis (ICD10-I70.0).  Electronically Signed   By: Marijo Conception M.D.   On: 02/20/2020 12:39   DG Hip Unilat With Pelvis 2-3 Views Right  Result Date: 02/20/2020 CLINICAL DATA:  Right hip pain after fall last night. EXAM: DG HIP (WITH OR WITHOUT PELVIS) 2-3V RIGHT COMPARISON:  None. FINDINGS: There is no evidence of hip fracture or dislocation. There is no evidence of arthropathy or other focal bone abnormality. IMPRESSION: Negative. Electronically Signed   By: Marijo Conception M.D.   On: 02/20/2020 11:03

## 2020-02-21 NOTE — Plan of Care (Signed)
Pt arrived to [facility] from ED; pt arrived with clothes; a cell phone; purse; shoes; a silver necklace; pt was asked if she would like her belongings kept in safe or left with a relative; pt refused;   Upon initial assessment pt is A/Ox4; drowsy; lethargic; VSS; low hemoglobin and potassium lab values; 1 unit of RBCs transfused;; pt consented; tolerated transfusion well; no adverse reactions; pt c/o of R hip pain; see mar;  bed is locked in lowest position; 3 side rails up; call bell is within reach. Continue to monitor and follow plan of care.      Problem: Education: Goal: Knowledge of risk factors and measures for prevention of condition will improve Outcome: Progressing   Problem: Coping: Goal: Psychosocial and spiritual needs will be supported Outcome: Progressing   Problem: Respiratory: Goal: Will maintain a patent airway Outcome: Progressing Goal: Complications related to the disease process, condition or treatment will be avoided or minimized Outcome: Progressing   Problem: Education: Goal: Knowledge of General Education information will improve Description: Including pain rating scale, medication(s)/side effects and non-pharmacologic comfort measures Outcome: Progressing   Problem: Health Behavior/Discharge Planning: Goal: Ability to manage health-related needs will improve Outcome: Progressing   Problem: Clinical Measurements: Goal: Ability to maintain clinical measurements within normal limits will improve Outcome: Progressing Goal: Will remain free from infection Outcome: Progressing Goal: Diagnostic test results will improve Outcome: Progressing Goal: Respiratory complications will improve Outcome: Progressing Goal: Cardiovascular complication will be avoided Outcome: Progressing   Problem: Activity: Goal: Risk for activity intolerance will decrease Outcome: Progressing   Problem: Nutrition: Goal: Adequate nutrition will be maintained Outcome: Progressing    Problem: Coping: Goal: Level of anxiety will decrease Outcome: Progressing   Problem: Elimination: Goal: Will not experience complications related to bowel motility Outcome: Progressing Goal: Will not experience complications related to urinary retention Outcome: Progressing   Problem: Pain Managment: Goal: General experience of comfort will improve Outcome: Progressing   Problem: Safety: Goal: Ability to remain free from injury will improve Outcome: Progressing   Problem: Skin Integrity: Goal: Risk for impaired skin integrity will decrease Outcome: Progressing

## 2020-02-21 NOTE — Plan of Care (Signed)

## 2020-02-21 NOTE — Progress Notes (Signed)
Spoke with patient's daughter [name]) over the phone regarding patient's status and plan for the day.

## 2020-02-21 NOTE — Consult Note (Signed)
Referring Provider: Dr. Wynetta Fines [facility]) Primary Care Physician:  Lawerance Cruel, MD Primary Gastroenterologist:  Dr. Paulita Fujita [facility])  Reason for Consultation:  Decompensated cirrhosis  HPI: Dana Watkins is a 65 y.o. female with current COVID-19 infection with past medical history noted below to include cirrhosis of the liver and portal hypertensive gastropathy, anemia, CAD, CKD stage III, and pancytopenia presenting for consultation of decompensated cirrhosis.  Patient appears encephalopathic.  Responses are slowed.  She had a piece of food in her hand and asked me if it was a pill.  When asked what year it is, she responded "what is that?" I informed her it is 2022.  Later, when asked what year it is, she responded "2002."  She knows she is in [facility] but states it is due to her pelvic fracture.  She reports pelvic pain but denies abdominal pain, nausea, vomiting, changes in stool, melena, or hematochezia.  She denies recent alcohol use, though per hospitalist note yesterday, patient reported she "only drinks a glass of wine here and there" and admitted last drink was last week.  Patient was referred to [facility] for consideration of transplant in 2021 but it does not appear she has followed up with them.  She cancelled last FU in GI office and did not reschedule.  She was last seen in the office in 09/2019.  Patient was last hospitalized in 07/2019 with decompensated cirrhosis and pleural effusion/hemothorax.  Past Medical History:  Diagnosis Date  . Acute hypokalemia 11/26/2014  . Arthritis 05/15/2015  . Benign essential hypertension 05/15/2015  . C. difficile colitis   . CAD (coronary artery disease)    a. cath 04/2017: ""Diffuse, calcific CAD particularly in the distal RCA and proximal to mid LAD.  LAD disease is nonobstructive.  RCA disease is more severe but does not appear significant.  Given her lack of symptoms, would pursue medical therapy."  . CKD (chronic kidney  disease), stage III ([place])   . Hypokalemia   . Migraine headache 05/15/2015  . Mild mitral regurgitation   . Pancytopenia ([place])   . Sepsis ([place]) 01/2017  . Thrombocytopenia ([place])    a. chronic thrombocytopenia (ITP - remotely saw hematology).  . Vitamin D deficiency     Past Surgical History:  Procedure Laterality Date  . CESAREAN SECTION     24 years ago  . ESOPHAGOGASTRODUODENOSCOPY (EGD) WITH PROPOFOL N/A 06/13/2019   Procedure: ESOPHAGOGASTRODUODENOSCOPY (EGD) WITH PROPOFOL;  Surgeon: Arta Silence, MD;  Location: [facility] ENDOSCOPY;  Service: Endoscopy;  Laterality: N/A;  administration of one bag of platelets   . LEFT HEART CATH AND CORONARY ANGIOGRAPHY N/A 05/12/2017   Procedure: LEFT HEART CATH AND CORONARY ANGIOGRAPHY;  Surgeon: [name] Booze, MD;  Location: [place] CV LAB;  Service: Cardiovascular;  Laterality: N/A;  . THORACENTESIS N/A 08/28/2019   Procedure: Mathews Robinsons;  Surgeon: Candee Furbish, MD;  Location: [facility] ENDOSCOPY;  Service: Pulmonary;  Laterality: N/A;  . tonsils and adneoids     as a child  . ULTRASOUND GUIDANCE FOR VASCULAR ACCESS  05/12/2017   Procedure: Ultrasound Guidance For Vascular Access;  Surgeon: [name] Booze, MD;  Location: [place] CV LAB;  Service: Cardiovascular;;    Prior to Admission medications   Medication Sig Start Date End Date Taking? Authorizing Provider  Ascorbic Acid (VITAMIN C) 1000 MG tablet Take 1,000 mg by mouth in the morning and at bedtime. Chewable   Yes [provider]  B Complex Vitamins (B COMPLEX  PO) Take by mouth.   Yes [provider]  carvedilol (COREG) 3.125 MG tablet Take 1 tablet (3.125 mg total) by mouth 2 (two) times daily with a meal. 12/25/19  Yes [name], MD  MAGNESIUM PO Take 400 mg by mouth daily.    Yes [provider]  Multiple Vitamin (MULTIVITAMIN WITH MINERALS) TABS tablet Take 1 tablet by mouth daily. 06/29/18  Yes Sheikh, Omair Latif, DO  pantoprazole  (PROTONIX) 40 MG tablet Take 40 mg by mouth daily as needed (for heartburn/acid reflux).   Yes [provider]  potassium chloride SA (KLOR-CON) 20 MEQ tablet Take 1 tablet (20 mEq total) by mouth 2 (two) times daily. 08/14/19  Yes Dixie Dials, MD  spironolactone (ALDACTONE) 25 MG tablet Take 25 mg by mouth daily.   Yes [provider]    Scheduled Meds: . vitamin C  1,000 mg Oral BID  . carvedilol  3.125 mg Oral BID WC  . folic acid  1 mg Oral Daily  . magnesium oxide  400 mg Oral Daily  . multivitamin with minerals  1 tablet Oral Daily  . phytonadione  10 mg Oral Once  . potassium chloride SA  20 mEq Oral BID  . sodium bicarbonate  650 mg Oral BID  . spironolactone  25 mg Oral Daily  . thiamine  100 mg Oral Daily   Or  . thiamine  100 mg Intravenous Daily   Continuous Infusions: . albumin human 12.5 g (02/21/20 0846)   PRN Meds:.HYDROmorphone (DILAUDID) injection, LORazepam **OR** LORazepam, pantoprazole  Allergies as of 02/20/2020 - Review Complete 02/20/2020  Allergen Reaction Noted  . Sulfa antibiotics Nausea And Vomiting and Rash 04/30/2013    Family History  Problem Relation Age of Onset  . Pernicious anemia Maternal Grandfather   . Heart attack Maternal Grandfather   . Atrial fibrillation Mother   . Supraventricular tachycardia Father   . Heart disease Paternal Grandfather     Social History   Socioeconomic History  . Marital status: Divorced    Spouse name: Not on file  . Number of children: Not on file  . Years of education: Not on file  . Highest education level: Not on file  Occupational History  . Not on file  Tobacco Use  . Smoking status: Former Smoker    Years: 2.00    Start date: 06/25/1968    Quit date: 05/16/2014    Years since quitting: 5.7  . Smokeless tobacco: Never Used  Vaping Use  . Vaping Use: Never used  Substance and Sexual Activity  . Alcohol use: Yes    Comment: occ  . Drug use: No  . Sexual activity: Not on  file  Other Topics Concern  . Not on file  Social History Narrative   Lives with husband  In home.  Has seasonal allergies.  Drinks coffee, 1 cup AM and 3 plus sweet tea.  Has 3 grown kids.     Social Determinants of Health   Financial Resource Strain: Not on file  Food Insecurity: Not on file  Transportation Needs: Not on file  Physical Activity: Not on file  Stress: Not on file  Social Connections: Not on file  Intimate Partner Violence: Not on file    Review of Systems: Review of Systems  Constitutional: Negative for chills and fever.  HENT: Negative for hearing loss and tinnitus.   Eyes: Negative for pain and redness.  Respiratory: Positive for cough. Negative for shortness of breath.  Cardiovascular: Negative for chest pain and palpitations.  Gastrointestinal: Negative for abdominal pain, blood in stool, constipation, diarrhea, heartburn, melena, nausea and vomiting.  Genitourinary: Negative for flank pain and hematuria.  Musculoskeletal: Positive for falls and joint pain.  Skin: Negative for itching and rash.  Neurological: Negative for seizures and loss of consciousness.  Endo/Heme/Allergies: Negative for polydipsia. Does not bruise/bleed easily.  Psychiatric/Behavioral: Positive for substance abuse. The patient is not nervous/anxious.      Physical Exam: Vital signs: Vitals:   02/21/20 0200 02/21/20 0353  BP: 127/67 132/71  Pulse: 90 93  Resp: 16 15  Temp: 97.6 F (36.4 C) 97.7 F (36.5 C)  SpO2: 96% 93%     Physical Exam Vitals reviewed.  Constitutional:      General: She is not in acute distress. HENT:     Head: Normocephalic and atraumatic.     Mouth/Throat:     Mouth: Mucous membranes are moist.     Pharynx: Oropharynx is clear.  Eyes:     General: Scleral icterus present.     Extraocular Movements: Extraocular movements intact.  Cardiovascular:     Rate and Rhythm: Regular rhythm. Tachycardia present.     Pulses: Normal pulses.  Pulmonary:      Effort: Pulmonary effort is normal. No respiratory distress.  Abdominal:     General: Bowel sounds are normal. There is no distension.     Palpations: Abdomen is soft. There is no mass.     Tenderness: There is no abdominal tenderness. There is no guarding or rebound.     Hernia: No hernia is present.  Musculoskeletal:        General: No swelling or tenderness.     Cervical back: Normal range of motion and neck supple.  Skin:    General: Skin is warm and dry.     Coloration: Skin is jaundiced.  Neurological:     General: No focal deficit present.     Mental Status: She is lethargic and disoriented.  Psychiatric:        Mood and Affect: Mood normal.        Behavior: Behavior normal. Behavior is cooperative.      GI:  Lab Results: Recent Labs    02/20/20 1126 02/20/20 1413 02/20/20 1440 02/21/20 0608  WBC 6.5 6.0  --  6.5  HGB 7.6* 7.1* 6.8* 7.4*  HCT 23.5* 22.7* 20.0* 23.4*  PLT 42* 42*  --  43*   BMET Recent Labs    02/20/20 1126 02/20/20 1413 02/20/20 1440 02/21/20 0608  NA 139 138 142 138  K 3.2* 3.1* 3.2* 3.4*  CL 113* 113*  --  113*  CO2 16* 15*  --  17*  GLUCOSE 105* 90  --  95  BUN 19 18  --  19  CREATININE 1.61* 1.51*  --  1.74*  CALCIUM 9.4 9.0  --  9.3   LFT Recent Labs    02/21/20 0608  PROT 4.8*  ALBUMIN 2.0*  AST 110*  ALT 48*  ALKPHOS 240*  BILITOT 7.7*   PT/INR Recent Labs    02/20/20 1126 02/21/20 0608  LABPROT 24.0* 25.5*  INR 2.2* 2.4*     Studies/Results: DG Chest 1 View  Result Date: 02/20/2020 CLINICAL DATA:  Fall EXAM: CHEST  1 VIEW COMPARISON:  10/10/2019 FINDINGS: Cardiac enlargement with vascular congestion. Negative for edema or effusion. Mild left lower lobe airspace disease most likely atelectasis. No acute skeletal abnormality. Chronic left rib fractures. IMPRESSION: Cardiac  enlargement and mild vascular congestion. Negative for edema. Mild left lower lobe atelectasis. Electronically Signed   By: Franchot Gallo  M.D.   On: 02/20/2020 14:46   CT PELVIS WO CONTRAST  Result Date: 02/20/2020 CLINICAL DATA:  Right hip pain after fall 2 days ago. EXAM: CT PELVIS WITHOUT CONTRAST TECHNIQUE: Multidetector CT imaging of the pelvis was performed following the standard protocol without intravenous contrast. COMPARISON:  June 25, 2018. FINDINGS: Urinary Tract:  No abnormality visualized. Bowel:  Unremarkable visualized pelvic bowel loops. Vascular/Lymphatic: Atherosclerosis of abdominal aorta is noted. No adenopathy is noted. Reproductive:  No mass or other significant abnormality Other: Mild ascites is noted in the pelvis. Minimal anasarca is noted. No hernia is noted. Musculoskeletal: Minimally displaced acute fractures are seen involving the right inferior and superior pubic rami. The hip joints are unremarkable. IMPRESSION: 1. Minimally displaced acute fractures are seen involving the right inferior and superior pubic rami. 2. Mild ascites is noted in the pelvis. Minimal anasarca is noted. 3. Aortic atherosclerosis. Aortic Atherosclerosis (ICD10-I70.0). Electronically Signed   By: Marijo Conception M.D.   On: 02/20/2020 12:39   DG Hip Unilat With Pelvis 2-3 Views Right  Result Date: 02/20/2020 CLINICAL DATA:  Right hip pain after fall last night. EXAM: DG HIP (WITH OR WITHOUT PELVIS) 2-3V RIGHT COMPARISON:  None. FINDINGS: There is no evidence of hip fracture or dislocation. There is no evidence of arthropathy or other focal bone abnormality. IMPRESSION: Negative. Electronically Signed   By: Marijo Conception M.D.   On: 02/20/2020 11:03    Impression/ Decompensated cirrhosis with encephalopathy and coagulopathy +/- alcoholic hepatitis.  MELD Na score of 29 as of 2/10 (19.6% 63-monthmortality).  Hepatic discriminant function > 32 as of 2/10 (55.1 with PT control of 15.2) -T. Bili 7.7/ AST 110/ ALT 48/ ALP 240 -INR 2.4 -BUN 19/ Cr 1.74  Acute on chronic anemia: Hgb 7.4, improved from 6.8 after 1u pRBCs -Normal BUN and  lack of overt bleeding makes acute upper GI bleeding unlikely -EGD 06/2019: LA Grade A esophagitis, Portal hypertensive gastropathy.  COVID-19 infection  Minimally displaced acute pubic rami fracture  Plan: Start lactulose 30g twice daily, titrate for 2-3 soft bowel movements per day.  Start Protonix 40 mg IV twice daily.  Start prednisolone 40 mg daily for suspected alcoholic hepatitis, given discriminant function.  Continue spironolactone 25 mg daily  Continue to monitor CBC with transfusion as needed to maintain hemoglobin greater than 7.  Continue to trend LFTs.  [facility] will follow.   LOS: 1 day   ASalley Slaughter PA-C 02/21/2020, 8:59 AM  Contact #  [phone]

## 2020-02-22 LAB — COMPREHENSIVE METABOLIC PANEL
ALT: 43 U/L (ref 0–44)
AST: 92 U/L — ABNORMAL HIGH (ref 15–41)
Albumin: 2.3 g/dL — ABNORMAL LOW (ref 3.5–5.0)
Alkaline Phosphatase: 238 U/L — ABNORMAL HIGH (ref 38–126)
Anion gap: 7 (ref 5–15)
BUN: 18 mg/dL (ref 8–23)
CO2: 16 mmol/L — ABNORMAL LOW (ref 22–32)
Calcium: 9.3 mg/dL (ref 8.9–10.3)
Chloride: 115 mmol/L — ABNORMAL HIGH (ref 98–111)
Creatinine, Ser: 1.55 mg/dL — ABNORMAL HIGH (ref 0.44–1.00)
GFR, Estimated: 37 mL/min — ABNORMAL LOW (ref >60)
Glucose, Bld: 150 mg/dL — ABNORMAL HIGH (ref 70–99)
Potassium: 3.8 mmol/L (ref 3.5–5.1)
Sodium: 138 mmol/L (ref 135–145)
Total Bilirubin: 7.5 mg/dL — ABNORMAL HIGH (ref 0.3–1.2)
Total Protein: 5.1 g/dL — ABNORMAL LOW (ref 6.5–8.1)

## 2020-02-22 LAB — CBC
HCT: 22.9 % — ABNORMAL LOW (ref 36.0–46.0)
Hemoglobin: 7.1 g/dL — ABNORMAL LOW (ref 12.0–15.0)
MCH: 30.3 pg (ref 26.0–34.0)
MCHC: 31 g/dL (ref 30.0–36.0)
MCV: 97.9 fL (ref 80.0–100.0)
Platelets: 45 10*3/uL — ABNORMAL LOW (ref 150–400)
RBC: 2.34 MIL/uL — ABNORMAL LOW (ref 3.87–5.11)
RDW: 19.7 % — ABNORMAL HIGH (ref 11.5–15.5)
WBC: 4.7 10*3/uL (ref 4.0–10.5)
nRBC: 0 % (ref 0.0–0.2)

## 2020-02-22 LAB — PROTIME-INR
INR: 2.8 — ABNORMAL HIGH (ref 0.8–1.2)
Prothrombin Time: 28.5 seconds — ABNORMAL HIGH (ref 11.4–15.2)

## 2020-02-22 LAB — PREPARE RBC (CROSSMATCH)

## 2020-02-22 MED ORDER — OXYCODONE HCL 5 MG PO TABS
5.0000 mg | ORAL_TABLET | Freq: Once | ORAL | Status: AC | PRN
  Administered 2020-02-22: 5 mg via ORAL
  Filled 2020-02-22: qty 1

## 2020-02-22 MED ORDER — SODIUM CHLORIDE 0.9% IV SOLUTION: Freq: Once | INTRAVENOUS | Status: AC

## 2020-02-22 MED ORDER — OXYCODONE HCL 5 MG PO TABS
5.0000 mg | ORAL_TABLET | Freq: Four times a day (QID) | ORAL | Status: DC | PRN
  Administered 2020-02-22 – 2020-02-25 (×6): 5 mg via ORAL
  Filled 2020-02-22 (×7): qty 1

## 2020-02-22 NOTE — TOC Initial Note (Signed)
Transition of Care [facility]) - Initial/Assessment Note    Patient Details  Name: Dana Watkins MRN: [id number] Date of Birth: 04/16/1955  Transition of Care [facility]) CM/SW Contact:    [place], [place] Phone Number: [phone] 02/22/2020, 2:20 PM  Clinical Narrative:                 CSW received consult for possible SNF placement at time of discharge. CSW spoke with patient. Patient reported that patient's is currently unable to care for herself at her home given her current physical needs and fall risk. Patient expressed understanding of PT recommendation and is agreeable to SNF placement at time of discharge. Patient reports understanding about waiting till she is out of her COVID isolation period for further bed availability and placement options. CSW discussed insurance authorization process and provided Medicare SNF ratings list. Patient has received the COVID vaccines. Patient reported that she did not want the CSW to contact her family about her discharge plan and that she will inform her family of the plan and further updates as needed. Patient expressed being hopeful for rehab and to feel better soon. CSW discussed etoh use with the patient and she declined wanting resources at this time. No further questions reported at this time.   CSW will continue to follow for discharge.  Expected Discharge Plan: Skilled Nursing Facility Barriers to Discharge: SNF Covid Recovering,Continued Medical Work up   Patient Goals and CMS Choice Patient states their goals for this hospitalization and ongoing recovery are:: to go to SNF CMS Medicare.gov Compare Post Acute Care list provided to:: Patient Choice offered to / list presented to : Patient  Expected Discharge Plan and Services Expected Discharge Plan: [place] In-house Referral: NA Discharge Planning Services: CM Consult Post Acute Care Choice: [place] Living arrangements for the past 2 months: Apartment                  DME Arranged: N/A DME Agency: NA       HH Arranged: NA HH Agency: NA        Prior Living Arrangements/Services Living arrangements for the past 2 months: Apartment Lives with:: Self Patient language and need for interpreter reviewed:: Yes Do you feel safe going back to the place where you live?: Yes      Need for Family Participation in Patient Care: No (Comment) Care giver support system in place?: No (comment)   Criminal Activity/Legal Involvement Pertinent to Current Situation/Hospitalization: No - Comment as needed  Activities of Daily Living      Permission Sought/Granted Permission sought to share information with : Case Manager Permission granted to share information with : Yes, Verbal Permission Granted  Share Information with NAME: SNF's  Permission granted to share info w AGENCY: SNF's        Emotional Assessment   Attitude/Demeanor/Rapport: Engaged Affect (typically observed): Accepting Orientation: : Oriented to Self,Oriented to Place,Oriented to  Time,Oriented to Situation Alcohol / Substance Use: Alcohol Use Psych Involvement: No (comment)  Admission diagnosis:  Liver failure ([place]) [K72.90] Thrombocytopenia ([place]) [D69.6] Fall [W19.XXXA] Fall, initial encounter [id number]] Symptomatic anemia [D64.9] Closed nondisplaced fracture of pelvis, unspecified part of pelvis, initial encounter ([place]) [S32.9XXA] Hepatic cirrhosis, unspecified hepatic cirrhosis type, unspecified whether ascites present [facility]) [K74.60] Patient Active Problem List   Diagnosis Date Noted  . Liver failure without hepatic coma ([place]) 02/20/2020  . Liver failure ([place]) 02/20/2020  . Fall   . Hemothorax on left 08/24/2019  . Acute on  chronic respiratory failure ([place]) 08/07/2019  . Acute coronary syndrome ([place]) 08/06/2019  . Anemia due to stage 3 chronic kidney disease ([place]) 04/16/2019  . Fever 02/21/2019  . Hypophosphatemia   . Recurrent Clostridioides difficile  diarrhea 12/11/2018  . Weakness 12/09/2018  . Hyperbilirubinemia 10/31/2018  . C. difficile diarrhea 10/31/2018  . Hepatic cirrhosis ([place]) 10/31/2018  . Systolic murmur at cardiac apex 10/31/2018  . Sepsis ([place]) 10/30/2018  . Normocytic anemia 10/23/2018  . SIRS (systemic inflammatory response syndrome) ([place]) 10/16/2018  . Acute renal failure superimposed on stage 3 chronic kidney disease ([place]) 10/16/2018  . Acute lower UTI 10/16/2018  . Elevated troponin 10/16/2018  . Elevated d-dimer 10/16/2018  . UTI (urinary tract infection) 06/25/2018  . Community acquired pneumonia of right middle lobe of lung 06/09/2018  . AKI (acute kidney injury) ([place]) 06/09/2018  . GAD (generalized anxiety disorder) 11/06/2017  . Abnormal findings on diagnostic imaging of heart and coronary circulation   . Family history of coronary artery disease 05/09/2017  . DOE (dyspnea on exertion) 05/09/2017  . Coronary artery calcification seen on CT scan 03/08/2017  . Acute bronchitis 02/06/2017  . Dehydration   . Diarrhea 02/03/2017  . Nausea & vomiting 02/02/2017  . Arthritis 05/15/2015  . Benign essential hypertension 05/15/2015  . Migraine headache 05/15/2015  . Hypokalemia 11/26/2014  . Alkaline phosphatase elevation 11/26/2014  . Abnormal weight gain 11/26/2014  . Chronic leukopenia 05/28/2014  . Symptomatic anemia 05/28/2014  . Thrombocytopenia ([place]) 04/30/2013  . Elevated liver function tests 04/30/2013   PCP:  Lawerance Cruel, MD Pharmacy:   [facility], [place] [address] [place] [address] Phone: [phone] Fax: [phone]  [place], [place] - [address] [place] [address] Phone: [phone] Fax: [phone]     Social Determinants of Health ([place]) Interventions    Readmission Risk Interventions Readmission Risk Prevention Plan 08/14/2019 12/11/2018   Transportation Screening Complete Complete  PCP or Specialist Appt within 3-5 Days Complete Complete  HRI or [place] Complete Complete  Social Work Consult for [place] Planning/Counseling Complete Complete  Palliative Care Screening Not Applicable Not Applicable  Medication Review (RN Care Manager) Complete Referral to Pharmacy  Some recent data might be hidden

## 2020-02-22 NOTE — Progress Notes (Signed)
Subjective: No complaints.  Objective: Vital signs in last 24 hours: Temp:  [97.5 F (36.4 C)-98.7 F (37.1 C)] 97.6 F (36.4 C) (02/11 0915) Pulse Rate:  [80-102] 88 (02/11 0915) Resp:  [12-19] 19 (02/11 0915) BP: (111-143)/(55-76) 122/72 (02/11 0915) SpO2:  [92 %-97 %] 92 % (02/11 0915) Weight change:  Last BM Date: 02/22/20  PE: GEN:  NAD HEENT:  Jaundiced SKIN: Scattered ecchymoses NEURO:  More awake and alert, but still very confused ABD:  Soft, non-tender.  No appreciable ascites via exam.  Lab Results: CBC    Component Value Date/Time   WBC 4.7 02/22/2020 0202   RBC 2.34 (L) 02/22/2020 0202   HGB 7.1 (L) 02/22/2020 0202   HGB 8.5 (L) 02/04/2020 1408   HGB 7.9 (L) 02/15/2019 1012   HGB 12.6 11/25/2014 1532   HCT 22.9 (L) 02/22/2020 0202   HCT 23.2 (L) 02/15/2019 1012   HCT 38.5 11/25/2014 1532   PLT 45 (L) 02/22/2020 0202   PLT 79 (L) 02/04/2020 1408   PLT 82 (LL) 02/15/2019 1012   MCV 97.9 02/22/2020 0202   MCV 84 02/15/2019 1012   MCV 87.9 11/25/2014 1532   MCH 30.3 02/22/2020 0202   MCHC 31.0 02/22/2020 0202   RDW 19.7 (H) 02/22/2020 0202   RDW 17.0 (H) 02/15/2019 1012   RDW 15.9 (H) 11/25/2014 1532   LYMPHSABS 0.8 02/20/2020 1126   LYMPHSABS 1.2 02/15/2019 1012   LYMPHSABS 1.2 11/25/2014 1532   MONOABS 0.8 02/20/2020 1126   MONOABS 0.4 11/25/2014 1532   EOSABS 0.1 02/20/2020 1126   EOSABS 0.2 02/15/2019 1012   BASOSABS 0.0 02/20/2020 1126   BASOSABS 0.1 02/15/2019 1012   BASOSABS 0.0 11/25/2014 1532   CMP     Component Value Date/Time   NA 138 02/22/2020 0202   NA 139 02/15/2019 1012   NA 141 11/25/2014 1532   K 3.8 02/22/2020 0202   K 2.7 (LL) 11/25/2014 1532   CL 115 (H) 02/22/2020 0202   CO2 16 (L) 02/22/2020 0202   CO2 22 11/25/2014 1532   GLUCOSE 150 (H) 02/22/2020 0202   GLUCOSE 113 11/25/2014 1532   BUN 18 02/22/2020 0202   BUN 11 02/15/2019 1012   BUN 16.4 11/25/2014 1532   CREATININE 1.55 (H) 02/22/2020 0202   CREATININE  1.53 (H) 02/04/2020 1408   CREATININE 1.4 (H) 11/25/2014 1532   CALCIUM 9.3 02/22/2020 0202   CALCIUM 10.4 11/25/2014 1532   PROT 5.1 (L) 02/22/2020 0202   PROT 6.0 02/15/2019 1012   PROT 6.8 11/25/2014 1532   ALBUMIN 2.3 (L) 02/22/2020 0202   ALBUMIN 3.1 (L) 02/15/2019 1012   ALBUMIN 3.4 (L) 11/25/2014 1532   AST 92 (H) 02/22/2020 0202   AST 81 (H) 02/04/2020 1408   AST 39 (H) 11/25/2014 1532   ALT 43 02/22/2020 0202   ALT 43 02/04/2020 1408   ALT 21 11/25/2014 1532   ALKPHOS 238 (H) 02/22/2020 0202   ALKPHOS 193 (H) 11/25/2014 1532   BILITOT 7.5 (H) 02/22/2020 0202   BILITOT 7.1 (HH) 02/04/2020 1408   BILITOT 0.76 11/25/2014 1532   GFRNONAA 37 (L) 02/22/2020 0202   GFRNONAA 38 (L) 02/04/2020 1408   GFRAA 43 (L) 09/25/2019 1159   INR 2.8  Studies/Results: RUQ U/S mild ascites, cirrhosis without liver lesion, gallstones, CBD 6 mm  Assessment:  1.  Decompensated cirrhosis.  Coagulopathy.  Encephalopathy. 2.  Elevated LFTs.  Alcoholic hepatitis, in setting of ongoing alcohol use. 3.  Anemia, chronic, likely  multifactorial but no rampant GI bleeding. 4.  COVID +.  Plan:  1.  PPI, lactulose, xifaxan, prednisolone. 2.  Serial CBCs, transfuse as needed. 3.  No plans for endoscopic evaluation in absence of catastrophic bleeding necessitating ICU transfer. 4.  Patient's INR slightly increased and LFTs about same degree of elevation.  She is not candidate for liver transplant.  Supportive care and watchful waiting only management strategy at present. 5.  Follow daily INR and LFTs. 6.  Absolute alcohol cessation.  If patient makes it through this admission, and starts drinking again as outpatient, her projected 30-day mortality is well over 50%. 7.  Dana Watkins will revisit Monday.   Landry Dyke 02/22/2020, 12:06 PM   Cell [phone] If no answer or after 5 PM call [phone]

## 2020-02-22 NOTE — Progress Notes (Signed)
Physical Therapy Treatment Patient Details Name: Dana Watkins MRN: [id number] DOB: 10/16/1955 Today's Date: 02/22/2020    History of Present Illness Pt is a 65 y.o. female with recent fall sustaining R pubic rami fx, now admitted 02/20/20 after additional fall; imaging negative for acute injury, did show prior R pubic rami fx. Pt also with liver failure. PMH includes liver cirrhosis, ETOH abuse, chronic thrombocytopenia, CKD 3.   PT Comments    Pt progressing well with mobility, able to tolerate transfer training and initiate steps with RW and minA; pt remains limited by pain. Tolerated increased seated BLE ROM/therex. At this point, continue to recommend SNF-level therapies to maximize functional mobility and independence prior to return home. Will follow acutely to address established goals.   Follow Up Recommendations  SNF;Supervision for mobility/OOB (pending progress)     Equipment Recommendations   (TBD)    Recommendations for Other Services       Precautions / Restrictions Precautions Precautions: Fall Restrictions Other Position/Activity Restrictions: pt reports WBAT since pubic rami fx    Mobility  Bed Mobility Overal bed mobility: Needs Assistance Bed Mobility: Supine to Sit     Supine to sit: Supervision;HOB elevated     General bed mobility comments: Cues for sequencing to decrease pubic rami pain, significant increased time and effort due to pain, requiring frequent cues to stay on task    Transfers Overall transfer level: Needs assistance Equipment used: Rolling walker (2 wheeled) Transfers: Sit to/from Stand Sit to Stand: Min assist         General transfer comment: MinA to assist trunk elevation and stabilize RW  Ambulation/Gait Ambulation/Gait assistance: Min assist Gait Distance (Feet): 2 Feet Assistive device: Rolling walker (2 wheeled) Gait Pattern/deviations: Step-to pattern;Trunk flexed;Antalgic Gait velocity: Decreased   General Gait  Details: Steps to recliner with RW and minA for stability; cues for sequencing, pt taking very short step lengths and limited by pain   Stairs             Wheelchair Mobility    Modified Rankin (Stroke Patients Only)       Balance Overall balance assessment: Needs assistance Sitting-balance support: No upper extremity supported;Feet supported Sitting balance-Leahy Scale: Fair     Standing balance support: Bilateral upper extremity supported Standing balance-Leahy Scale: Poor Standing balance comment: Reliant on UE support                            Cognition Arousal/Alertness: Awake/alert Behavior During Therapy: WFL for tasks assessed/performed Overall Cognitive Status: No family/caregiver present to determine baseline cognitive functioning Area of Impairment: Attention;Following commands;Safety/judgement;Awareness;Problem solving                   Current Attention Level: Sustained;Selective   Following Commands: Follows one step commands consistently;Follows one step commands with increased time Safety/Judgement: Decreased awareness of safety;Decreased awareness of deficits Awareness: Emergent Problem Solving: Slow processing;Requires verbal cues General Comments: Pt much more alert; still with some inappropriate/labile laughter      Exercises General Exercises - Lower Extremity Ankle Circles/Pumps: AROM;Both;Seated Short Arc Quad: AROM;Both;Seated (w/ quad set hold) Long Arc Quad: AROM;Both;Seated Heel Raises: AROM;Both;Seated    General Comments        Pertinent Vitals/Pain Pain Assessment: Faces Faces Pain Scale: Hurts little more Pain Location: pelvis, bilateral hips Pain Descriptors / Indicators: Guarding;Grimacing Pain Intervention(s): Monitored during session;Repositioned    Home Living  Prior Function            PT Goals (current goals can now be found in the care plan section) Progress  towards PT goals: Progressing toward goals    Frequency    Min 3X/week      PT Plan Current plan remains appropriate    Co-evaluation              AM-PAC PT "6 Clicks" Mobility   Outcome Measure  Help needed turning from your back to your side while in a flat bed without using bedrails?: A Little Help needed moving from lying on your back to sitting on the side of a flat bed without using bedrails?: A Little Help needed moving to and from a bed to a chair (including a wheelchair)?: A Little Help needed standing up from a chair using your arms (e.g., wheelchair or bedside chair)?: A Little Help needed to walk in hospital room?: A Little Help needed climbing 3-5 steps with a railing? : A Lot 6 Click Score: 17    End of Session Equipment Utilized During Treatment: Gait belt Activity Tolerance: Patient tolerated treatment well;Patient limited by pain Patient left: in chair;with call bell/phone within reach;with chair alarm set Nurse Communication: Mobility status PT Visit Diagnosis: Unsteadiness on feet (R26.81);Muscle weakness (generalized) (M62.81);History of falling (Z91.81);Pain;Difficulty in walking, not elsewhere classified (R26.2) Pain - Right/Left: Right Pain - part of body: Hip     Time: 5189-8421 PT Time Calculation (min) (ACUTE ONLY): 32 min  Charges:  $Therapeutic Exercise: 8-22 mins $Therapeutic Activity: 8-22 mins                    Mabeline Caras, PT, DPT Acute Rehabilitation Services  Pager [phone] Office [place] 02/22/2020, 4:39 PM

## 2020-02-22 NOTE — Progress Notes (Signed)
PROGRESS NOTE                                                                             PROGRESS NOTE                                                                                                                                                                                                             Patient Demographics:    Dana Watkins, is a 65 y.o. female, DOB - 06/06/1955, DXA:[id number]  Outpatient Primary MD for the patient is Lawerance Cruel, MD    LOS - 2  Admit date - 02/20/2020    Chief Complaint  Patient presents with  . Hip Pain       Brief Narrative     HPI: Dana Watkins is a 65 y.o. female with medical history significant of liver cirrhosis secondary to alcohol abuse, chronic thrombocytopenia secondary to cirrhosis and remote ITP, CKD stage III, hypokalemia, presented with fall and hip pain.  Patient reported that he stepped on black ice this Monday and fell down in the parking lot.  She went to see orthopedic surgery who did outpatient imaging study showed pubic Rami fracture, stable, and patient sent home with p.o. oxycodone.  Despite taking oxycodone, patient continued to feel frequent breakthrough pain of right hip and fell down another 2-3 times since yesterday.  Denies any head injury, loss of consciousness.  Patient denied any black tarry stool or blood in the stool.  No abdominal pain.  No fever.  Patient admitted used to be a heavy drinker, but " only drinks a glass of wine here and there" and she admitted last drink was last week.  Patient also reported she has lost follow-up with GI since summer 2021, and she has been following with oncology for B12 shots.  ED Course: Worsening of liver function, CT pelvic showed stable right pubic Rami fracture.    Subjective:    Dana Watkins today remains confused, no significant events overnight as discussed with staff, complains of pain  in the hip area which  resolved with oxycodone.      Assessment  & Plan :    Active Problems:   Symptomatic anemia   Hepatic cirrhosis (HCC)   Liver failure without hepatic coma (HCC)   Liver failure (HCC)   Decompensated liver cirrhosis due to alcohol abuse, with encephalopathy and coagulopathy and alcoholic hepatitis . -GI input greatly appreciated, total bilirubin elevated at 7.7, AST at 110, ALT at 48, alk phos at 240 -With evidence of coagulopathy INR was 2.4. -Continue with lactulose, monitor ammonia level closely, elevated at 45, will recheck in a.m. -Continue with Protonix IV twice daily -Started on prednisolone for alcoholic hepatitis -Continue to monitor LFTs no significant change over last 24 hours, total bili significantly elevated at 7.5 this morning -Continue with rifaximin -Remains with significant coagulopathy and elevated INR despite receiving vitamin K x2 -With upper quadrant ultrasound significant for cirrhosis with mild ascites  Acute on chronic anemia -No evidence of significant GI bleed, continue with Protonix. -Transfuse as needed, hemoglobin low at 7.1 this morning, will give another unit PRBC today.  Thrombocytopenia -Due to above, on SCD for DVT prophylaxis  CKD stage III -HCO3=16, started bicarb p.o. -At baseline, monitor closely as on Aldactone  Hypokalemia/hypophosphatemia -repelted  Pelvic/Rami fracture -PT evaluation  Alcohol abuse -No symptoms signs of active withdrawal, CIWA protocol with as needed benzos.  COVID 19 infection -She is vaccinated and boosted, asymptomatic, no hypoxia,  remdesivir x3 days.  SpO2: 99 %  Recent Labs  Lab 02/20/20 1126 02/20/20 1324 02/20/20 1413 02/21/20 0608 02/22/20 0202  WBC 6.5  --  6.0 6.5 4.7  PLT 42*  --  42* 43* 45*  AST 123*  --  117* 110* 92*  ALT 52*  --  52* 48* 43  ALKPHOS 261*  --  237* 240* 238*  BILITOT 7.7*  --  7.5* 7.7* 7.5*  ALBUMIN 2.2*  --  2.1* 2.0* 2.3*  INR 2.2*  --   --  2.4* 2.8*   SARSCOV2NAA  --  POSITIVE*  --   --   --        ABG     Component Value Date/Time   HCO3 17.9 (L) 02/21/2020 0608   TCO2 16 (L) 02/20/2020 1440   ACIDBASEDEF 6.0 (H) 02/21/2020 0608   O2SAT 92.2 02/21/2020 0608           Condition - Extremely Guarded  Family Communication  :  Daughter by phone 2/10, 2/11  Code Status :  Full  Consults  :  GI consult  Disposition Plan  :    Status is: Inpatient  Remains inpatient appropriate because:IV treatments appropriate due to intensity of illness or inability to take PO   Dispo: The patient is from: Home              Anticipated d/c is to: Home              Anticipated d/c date is: 3 days              Patient currently is not medically stable to d/c.   Difficult to place patient No      DVT Prophylaxis  : SCDs   Lab Results  Component Value Date   PLT 45 (L) 02/22/2020    Diet :  Diet Order            Diet Heart Room service appropriate? Yes; Fluid consistency: Thin  Diet effective now  Inpatient Medications  Scheduled Meds: . vitamin C  1,000 mg Oral BID  . carvedilol  3.125 mg Oral BID WC  . folic acid  1 mg Oral Daily  . lactulose  30 g Oral BID  . magnesium oxide  400 mg Oral Daily  . multivitamin with minerals  1 tablet Oral Daily  . pantoprazole (PROTONIX) IV  40 mg Intravenous Q12H  . phosphorus  250 mg Oral BID  . phytonadione  10 mg Oral Once  . potassium chloride SA  20 mEq Oral BID  . prednisoLONE  40 mg Oral Daily  . sodium bicarbonate  650 mg Oral BID  . spironolactone  25 mg Oral Daily  . thiamine  100 mg Oral Daily   Or  . thiamine  100 mg Intravenous Daily   Continuous Infusions: . remdesivir 100 mg in NS 100 mL 100 mg (02/22/20 1257)   PRN Meds:.LORazepam **OR** LORazepam, oxyCODONE  Antibiotics  :    Anti-infectives (From admission, onward)   Start     Dose/Rate Route Frequency Ordered Stop   02/22/20 1000  remdesivir 100 mg in sodium chloride 0.9 %  100 mL IVPB        100 mg 200 mL/hr over 30 Minutes Intravenous Daily 02/21/20 1050 02/24/20 0959   02/21/20 1145  remdesivir 200 mg in sodium chloride 0.9% 250 mL IVPB        200 mg 580 mL/hr over 30 Minutes Intravenous Once 02/21/20 1050 02/21/20 1429       Macala Baldonado M.D on 02/22/2020 at 2:33 PM  To page go to [email or web address]   [facility] -  Office  [phone]      Objective:   Vitals:   02/22/20 0436 02/22/20 0847 02/22/20 0915 02/22/20 1245  BP: 120/67 126/69 122/72 116/70  Pulse: 80 83 88 87  Resp: 12 16 19 17   Temp: (!) 97.5 F (36.4 C) 97.7 F (36.5 C) 97.6 F (36.4 C) 97.6 F (36.4 C)  TempSrc: Oral Oral Oral Oral  SpO2: 96% 97% 92% 99%  Weight:      Height:        Wt Readings from Last 3 Encounters:  02/20/20 73.9 kg  02/04/20 66.8 kg  12/25/19 65.8 kg     Intake/Output Summary (Last 24 hours) at 02/22/2020 1433 Last data filed at 02/22/2020 0835 Gross per 24 hour  Intake 590 ml  Output -  Net 590 ml     Physical Exam  Awake Alert, even though she is oriented x3, but she is significantly confused with impaired cognition and insight  Jaundiced  Symmetrical Chest wall movement, Good air movement bilaterally, CTAB RRR,No Gallops,Rubs or new Murmurs, No Parasternal Heave +ve B.Sounds, Abd Soft, No tenderness, No rebound - guarding or rigidity. No Cyanosis, Clubbing or edema, No new Rash or bruise     Data Review:    CBC Recent Labs  Lab 02/20/20 1126 02/20/20 1413 02/20/20 1440 02/21/20 0608 02/22/20 0202  WBC 6.5 6.0  --  6.5 4.7  HGB 7.6* 7.1* 6.8* 7.4* 7.1*  HCT 23.5* 22.7* 20.0* 23.4* 22.9*  PLT 42* 42*  --  43* 45*  MCV 100.4* 100.0  --  96.7 97.9  MCH 32.5 31.3  --  30.6 30.3  MCHC 32.3 31.3  --  31.6 31.0  RDW 18.1* 18.1*  --  18.9* 19.7*  LYMPHSABS 0.8  --   --   --   --   MONOABS 0.8  --   --   --   --  EOSABS 0.1  --   --   --   --   BASOSABS 0.0  --   --   --   --     Recent Labs  Lab 02/20/20 1126  02/20/20 1127 02/20/20 1413 02/20/20 1440 02/21/20 0608 02/22/20 0202  NA 139  --  138 142 138 138  K 3.2*  --  3.1* 3.2* 3.4* 3.8  CL 113*  --  113*  --  113* 115*  CO2 16*  --  15*  --  17* 16*  GLUCOSE 105*  --  90  --  95 150*  BUN 19  --  18  --  19 18  CREATININE 1.61*  --  1.51*  --  1.74* 1.55*  CALCIUM 9.4  --  9.0  --  9.3 9.3  AST 123*  --  117*  --  110* 92*  ALT 52*  --  52*  --  48* 43  ALKPHOS 261*  --  237*  --  240* 238*  BILITOT 7.7*  --  7.5*  --  7.7* 7.5*  ALBUMIN 2.2*  --  2.1*  --  2.0* 2.3*  MG  --   --  1.9  --   --   --   INR 2.2*  --   --   --  2.4* 2.8*  AMMONIA  --  45*  --   --   --   --     ------------------------------------------------------------------------------------------------------------------ No results for input(s): CHOL, HDL, LDLCALC, TRIG, CHOLHDL, LDLDIRECT in the last 72 hours.  No results found for: HGBA1C ------------------------------------------------------------------------------------------------------------------ No results for input(s): TSH, T4TOTAL, T3FREE, THYROIDAB in the last 72 hours.  Invalid input(s): FREET3  Cardiac Enzymes No results for input(s): CKMB, TROPONINI, MYOGLOBIN in the last 168 hours.  Invalid input(s): CK ------------------------------------------------------------------------------------------------------------------    Component Value Date/Time   BNP 153.2 (H) 08/07/2019 0030    Micro Results Recent Results (from the past 240 hour(s))  SARS CORONAVIRUS 2 (TAT 6-24 HRS) Nasopharyngeal Nasopharyngeal Swab     Status: Abnormal   Collection Time: 02/20/20  1:24 PM   Specimen: Nasopharyngeal Swab  Result Value Ref Range Status   SARS Coronavirus 2 POSITIVE (A) NEGATIVE Final    Comment: (NOTE) SARS-CoV-2 target nucleic acids are DETECTED.  The SARS-CoV-2 RNA is generally detectable in upper and lower respiratory specimens during the acute phase of infection. Positive results are indicative  of the presence of SARS-CoV-2 RNA. Clinical correlation with patient history and other diagnostic information is  necessary to determine patient infection status. Positive results do not rule out bacterial infection or co-infection with other viruses.  The expected result is Negative.  Fact Sheet for Patients: SugarRoll.be  Fact Sheet for Healthcare Providers: https://www.woods-mathews.com/  This test is not yet approved or cleared by the [place] FDA and  has been authorized for detection and/or diagnosis of SARS-CoV-2 by FDA under an Emergency Use Authorization (EUA). This EUA will remain  in effect (meaning this test can be used) for the duration of the COVID-19 declaration under Section 564(b)(1) of the Act, 21 U. S.C. section 360bbb-3(b)(1), unless the authorization is terminated or revoked sooner.   Performed at [facility], [place] [address] [place], [place] [address]     Radiology Reports DG Chest 1 View  Result Date: 02/20/2020 CLINICAL DATA:  Fall EXAM: CHEST  1 VIEW COMPARISON:  10/10/2019 FINDINGS: Cardiac enlargement with vascular congestion. Negative for edema or effusion. Mild left lower  lobe airspace disease most likely atelectasis. No acute skeletal abnormality. Chronic left rib fractures. IMPRESSION: Cardiac enlargement and mild vascular congestion. Negative for edema. Mild left lower lobe atelectasis. Electronically Signed   By: Franchot Gallo M.D.   On: 02/20/2020 14:46   CT PELVIS WO CONTRAST  Result Date: 02/20/2020 CLINICAL DATA:  Right hip pain after fall 2 days ago. EXAM: CT PELVIS WITHOUT CONTRAST TECHNIQUE: Multidetector CT imaging of the pelvis was performed following the standard protocol without intravenous contrast. COMPARISON:  June 25, 2018. FINDINGS: Urinary Tract:  No abnormality visualized. Bowel:  Unremarkable visualized pelvic bowel loops. Vascular/Lymphatic: Atherosclerosis of abdominal aorta is  noted. No adenopathy is noted. Reproductive:  No mass or other significant abnormality Other: Mild ascites is noted in the pelvis. Minimal anasarca is noted. No hernia is noted. Musculoskeletal: Minimally displaced acute fractures are seen involving the right inferior and superior pubic rami. The hip joints are unremarkable. IMPRESSION: 1. Minimally displaced acute fractures are seen involving the right inferior and superior pubic rami. 2. Mild ascites is noted in the pelvis. Minimal anasarca is noted. 3. Aortic atherosclerosis. Aortic Atherosclerosis (ICD10-I70.0). Electronically Signed   By: Marijo Conception M.D.   On: 02/20/2020 12:39   DG Hip Unilat With Pelvis 2-3 Views Right  Result Date: 02/20/2020 CLINICAL DATA:  Right hip pain after fall last night. EXAM: DG HIP (WITH OR WITHOUT PELVIS) 2-3V RIGHT COMPARISON:  None. FINDINGS: There is no evidence of hip fracture or dislocation. There is no evidence of arthropathy or other focal bone abnormality. IMPRESSION: Negative. Electronically Signed   By: Marijo Conception M.D.   On: 02/20/2020 11:03   US Abdomen Limited RUQ (LIVER/GB)  Result Date: 02/21/2020 CLINICAL DATA:  65 year old female with cirrhosis.  Abnormal LFTs. EXAM: ULTRASOUND ABDOMEN LIMITED RIGHT UPPER QUADRANT COMPARISON:  None. FINDINGS: Gallbladder: No gallstone. There is diffuse thickened appearance of the gallbladder measuring up to 5 mm, likely related to underlying liver disease. Negative sonographic Murphy's sign. Common bile duct: Diameter: 6 mm.  There is intrahepatic biliary ductal dilatation. Liver: Cirrhosis. Portal vein is patent on color Doppler imaging with normal direction of blood flow towards the liver. Other: Small ascites. There is a 5 cm right renal upper pole cyst. Probable small right pleural effusion. IMPRESSION: 1. Cirrhosis and small ascites. 2. Patent main portal vein with hepatopetal flow. 3. Diffuse gallbladder wall thickening, likely related to cirrhosis and  ascites. Electronically Signed   By: Anner Crete M.D.   On: 02/21/2020 23:47

## 2020-02-22 NOTE — Progress Notes (Signed)
Patient much more alert today as compared to yesterday. A&O x4. Following commands and answers questions appropriately.

## 2020-02-22 NOTE — NC FL2 (Signed)
[place] LEVEL OF CARE SCREENING TOOL     IDENTIFICATION  Patient Name: Dana Watkins Birthdate: 1955/04/06 Sex: female Admission Date (Current Location): 02/20/2020  [facility] and [place] Number:  Herbalist and Address:  [facility] [place], [place] [address] [place], [place] [address]      Provider Number: [id number]  Attending Physician Name and Address:  Elgergawy, Silver Huguenin, MD  Relative Name and Phone Number:  [name],  Daughter [phone]    Current Level of Care: Hospital Recommended Level of Care: [place] Prior Approval Number:    Date Approved/Denied:   PASRR Number: [id number]  Discharge Plan: SNF    Current Diagnoses: Patient Active Problem List   Diagnosis Date Noted  . Liver failure without hepatic coma ([place]) 02/20/2020  . Liver failure ([place]) 02/20/2020  . Fall   . Hemothorax on left 08/24/2019  . Acute on chronic respiratory failure ([place]) 08/07/2019  . Acute coronary syndrome ([place]) 08/06/2019  . Anemia due to stage 3 chronic kidney disease ([place]) 04/16/2019  . Fever 02/21/2019  . Hypophosphatemia   . Recurrent Clostridioides difficile diarrhea 12/11/2018  . Weakness 12/09/2018  . Hyperbilirubinemia 10/31/2018  . C. difficile diarrhea 10/31/2018  . Hepatic cirrhosis ([place]) 10/31/2018  . Systolic murmur at cardiac apex 10/31/2018  . Sepsis ([place]) 10/30/2018  . Normocytic anemia 10/23/2018  . SIRS (systemic inflammatory response syndrome) ([place]) 10/16/2018  . Acute renal failure superimposed on stage 3 chronic kidney disease ([place]) 10/16/2018  . Acute lower UTI 10/16/2018  . Elevated troponin 10/16/2018  . Elevated d-dimer 10/16/2018  . UTI (urinary tract infection) 06/25/2018  . Community acquired pneumonia of right middle lobe of lung 06/09/2018  . AKI (acute kidney injury) ([place]) 06/09/2018  . GAD (generalized anxiety disorder) 11/06/2017  . Abnormal findings on diagnostic imaging of  heart and coronary circulation   . Family history of coronary artery disease 05/09/2017  . DOE (dyspnea on exertion) 05/09/2017  . Coronary artery calcification seen on CT scan 03/08/2017  . Acute bronchitis 02/06/2017  . Dehydration   . Diarrhea 02/03/2017  . Nausea & vomiting 02/02/2017  . Arthritis 05/15/2015  . Benign essential hypertension 05/15/2015  . Migraine headache 05/15/2015  . Hypokalemia 11/26/2014  . Alkaline phosphatase elevation 11/26/2014  . Abnormal weight gain 11/26/2014  . Chronic leukopenia 05/28/2014  . Symptomatic anemia 05/28/2014  . Thrombocytopenia ([place]) 04/30/2013  . Elevated liver function tests 04/30/2013    Orientation RESPIRATION BLADDER Height & Weight     Self,Time,Situation,Place  Normal Continent Weight: 162 lb 14.7 oz (73.9 kg) Height:  5' 8"  (172.7 cm)  BEHAVIORAL SYMPTOMS/MOOD NEUROLOGICAL BOWEL NUTRITION STATUS      Incontinent Diet (see d/c summary)  AMBULATORY STATUS COMMUNICATION OF NEEDS Skin   Extensive Assist Verbally Skin abrasions (Ecchymosis)                       Personal Care Assistance Level of Assistance  Bathing,Feeding,Dressing Bathing Assistance: Maximum assistance Feeding assistance: Independent Dressing Assistance: Maximum assistance     Functional Limitations Info  Sight,Hearing,Speech Sight Info: Adequate Hearing Info: Adequate Speech Info: Adequate    SPECIAL CARE FACTORS FREQUENCY  PT (By licensed PT),OT (By licensed OT)     PT Frequency: 5X/week OT Frequency: 5X/week            Contractures Contractures Info: Not present    Additional Factors Info  Code Status,Allergies,Isolation Precautions Code Status Info: Full Allergies  Info: [place] Antibiotics     Isolation Precautions Info: COVID+ Patient has been vaccinated: [place] COVID-19 Vaccine 04/13/2019 , 03/23/2019     Current Medications (02/22/2020):  This is the current hospital active medication list Current Facility-Administered  Medications  Medication Dose Route Frequency Provider Last Rate Last Admin  . ascorbic acid (VITAMIN C) tablet 1,000 mg  1,000 mg Oral BID Wynetta Fines T, MD   1,000 mg at 02/22/20 5188  . carvedilol (COREG) tablet 3.125 mg  3.125 mg Oral BID WC Wynetta Fines T, MD   3.125 mg at 02/22/20 0904  . folic acid (FOLVITE) tablet 1 mg  1 mg Oral Daily Wynetta Fines T, MD   1 mg at 02/22/20 0904  . lactulose (CHRONULAC) 10 GM/15ML solution 30 g  30 g Oral BID Salley Slaughter, PA-C   30 g at 02/22/20 4166  . LORazepam (ATIVAN) tablet 1-4 mg  1-4 mg Oral Q1H PRN Lequita Halt, MD       Or  . LORazepam (ATIVAN) injection 1-4 mg  1-4 mg Intravenous Q1H PRN Wynetta Fines T, MD      . magnesium oxide (MAG-OX) tablet 400 mg  400 mg Oral Daily Wynetta Fines T, MD   400 mg at 02/22/20 0903  . multivitamin with minerals tablet 1 tablet  1 tablet Oral Daily Lequita Halt, MD   1 tablet at 02/22/20 0904  . oxyCODONE (Oxy IR/ROXICODONE) immediate release tablet 5 mg  5 mg Oral Q6H PRN Elgergawy, Silver Huguenin, MD      . pantoprazole (PROTONIX) injection 40 mg  40 mg Intravenous Q12H Salley Slaughter, PA-C   40 mg at 02/22/20 0903  . phosphorus (K PHOS NEUTRAL) tablet 250 mg  250 mg Oral BID Elgergawy, Silver Huguenin, MD   250 mg at 02/22/20 0904  . phytonadione (VITAMIN K) tablet 10 mg  10 mg Oral Once Wynetta Fines T, MD      . potassium chloride SA (KLOR-CON) CR tablet 20 mEq  20 mEq Oral BID Wynetta Fines T, MD   20 mEq at 02/22/20 0904  . prednisoLONE tablet 40 mg  40 mg Oral Daily Salley Slaughter, PA-C   40 mg at 02/22/20 1134  . remdesivir 100 mg in sodium chloride 0.9 % 100 mL IVPB  100 mg Intravenous Daily Elgergawy, Silver Huguenin, MD 200 mL/hr at 02/22/20 1257 100 mg at 02/22/20 1257  . sodium bicarbonate tablet 650 mg  650 mg Oral BID Wynetta Fines T, MD   650 mg at 02/22/20 0904  . spironolactone (ALDACTONE) tablet 25 mg  25 mg Oral Daily Wynetta Fines T, MD   25 mg at 02/22/20 0904  . thiamine tablet 100 mg  100 mg Oral  Daily Wynetta Fines T, MD   100 mg at 02/22/20 0630   Or  . thiamine (B-1) injection 100 mg  100 mg Intravenous Daily Lequita Halt, MD         Discharge Medications: Please see discharge summary for a list of discharge medications.  Relevant Imaging Results:  Relevant Lab Results:   Additional Information SS#: [id number]  Laurynn Mccorvey, LCSWA

## 2020-02-22 NOTE — Progress Notes (Signed)
Skilled Nursing Rehab Facilities-   [email or web address]    Ratings out of 5 possible   Name          Address       Phone #     Quality Care                                       Staffing-Health-Inspection-Overall [facility] & [phone] [address] [place], [place].     3 2 2 2  [place], [place], [place].          3 2 2  2

## 2020-02-23 LAB — COMPREHENSIVE METABOLIC PANEL
ALT: 44 U/L (ref 0–44)
AST: 79 U/L — ABNORMAL HIGH (ref 15–41)
Albumin: 2.3 g/dL — ABNORMAL LOW (ref 3.5–5.0)
Alkaline Phosphatase: 266 U/L — ABNORMAL HIGH (ref 38–126)
Anion gap: 8 (ref 5–15)
BUN: 19 mg/dL (ref 8–23)
CO2: 16 mmol/L — ABNORMAL LOW (ref 22–32)
Calcium: 9.9 mg/dL (ref 8.9–10.3)
Chloride: 114 mmol/L — ABNORMAL HIGH (ref 98–111)
Creatinine, Ser: 1.67 mg/dL — ABNORMAL HIGH (ref 0.44–1.00)
GFR, Estimated: 34 mL/min — ABNORMAL LOW (ref >60)
Glucose, Bld: 159 mg/dL — ABNORMAL HIGH (ref 70–99)
Potassium: 3.9 mmol/L (ref 3.5–5.1)
Sodium: 138 mmol/L (ref 135–145)
Total Bilirubin: 7.3 mg/dL — ABNORMAL HIGH (ref 0.3–1.2)
Total Protein: 5.2 g/dL — ABNORMAL LOW (ref 6.5–8.1)

## 2020-02-23 LAB — BPAM RBC
Blood Product Expiration Date: 202203112359
Blood Product Expiration Date: 202203122359
ISSUE DATE / TIME: 202202092255
ISSUE DATE / TIME: 202202110837
Unit Type and Rh: 5100
Unit Type and Rh: 5100

## 2020-02-23 LAB — TYPE AND SCREEN
ABO/RH(D): O POS
Antibody Screen: NEGATIVE
Unit division: 0
Unit division: 0

## 2020-02-23 LAB — CBC
HCT: 24.1 % — ABNORMAL LOW (ref 36.0–46.0)
Hemoglobin: 8.1 g/dL — ABNORMAL LOW (ref 12.0–15.0)
MCH: 32.3 pg (ref 26.0–34.0)
MCHC: 33.6 g/dL (ref 30.0–36.0)
MCV: 96 fL (ref 80.0–100.0)
Platelets: 54 10*3/uL — ABNORMAL LOW (ref 150–400)
RBC: 2.51 MIL/uL — ABNORMAL LOW (ref 3.87–5.11)
RDW: 19.7 % — ABNORMAL HIGH (ref 11.5–15.5)
WBC: 6.9 10*3/uL (ref 4.0–10.5)
nRBC: 0 % (ref 0.0–0.2)

## 2020-02-23 LAB — AMMONIA: Ammonia: 100 umol/L — ABNORMAL HIGH (ref 9–35)

## 2020-02-23 LAB — PROTIME-INR
INR: 2.7 — ABNORMAL HIGH (ref 0.8–1.2)
Prothrombin Time: 27.6 seconds — ABNORMAL HIGH (ref 11.4–15.2)

## 2020-02-23 MED ORDER — LACTULOSE 10 GM/15ML PO SOLN
40.0000 g | Freq: Three times a day (TID) | ORAL | Status: DC
  Administered 2020-02-23 – 2020-02-24 (×6): 40 g via ORAL
  Filled 2020-02-23 (×7): qty 60

## 2020-02-23 MED ORDER — RIFAXIMIN 550 MG PO TABS
550.0000 mg | ORAL_TABLET | Freq: Two times a day (BID) | ORAL | Status: DC
  Administered 2020-02-23 – 2020-02-28 (×10): 550 mg via ORAL
  Filled 2020-02-23 (×11): qty 1

## 2020-02-23 MED ORDER — PHYTONADIONE 5 MG PO TABS
10.0000 mg | ORAL_TABLET | Freq: Once | ORAL | Status: DC
  Filled 2020-02-23: qty 2

## 2020-02-23 MED ORDER — LACTULOSE 10 GM/15ML PO SOLN: 30.0000 g | Freq: Three times a day (TID) | ORAL | Status: DC

## 2020-02-23 NOTE — Progress Notes (Signed)
Pt cleaning her vagina with washcloth. Pt started to pivot to bed and I noticed drops of blood on floor. Got patient in bed in supine position and was able to locate source of bleeding. New skin tear located on right outer labia. Applied pressure for a couple minutes and was able to slow down flow. Applied pressure dressing to skin tear. Will report to upcoming shift RN to assess status.

## 2020-02-23 NOTE — Progress Notes (Signed)
PROGRESS NOTE                                                                             PROGRESS NOTE                                                                                                                                                                                                             Patient Demographics:    Dana Watkins, is a 65 y.o. female, DOB - 12/18/1955, HGD:[id number]  Outpatient Primary MD for the patient is Lawerance Cruel, MD    LOS - 3  Admit date - 02/20/2020    Chief Complaint  Patient presents with  . Hip Pain       Brief Narrative     HPI: Dana Watkins is a 65 y.o. female with medical history significant of liver cirrhosis secondary to alcohol abuse, chronic thrombocytopenia secondary to cirrhosis and remote ITP, CKD stage III, hypokalemia, presented with fall and hip pain.  Patient reported that he stepped on black ice this Monday and fell down in the parking lot.  She went to see orthopedic surgery who did outpatient imaging study showed pubic Rami fracture, stable, and patient sent home with p.o. oxycodone.  Despite taking oxycodone, patient continued to feel frequent breakthrough pain of right hip and fell down another 2-3 times since yesterday.  Denies any head injury, loss of consciousness.  Patient denied any black tarry stool or blood in the stool.  No abdominal pain.  No fever.  Patient admitted used to be a heavy drinker, but " only drinks a glass of wine here and there" and she admitted last drink was last week.  Patient also reported she has lost follow-up with GI since summer 2021, and she has been following with oncology for B12 shots.  ED Course: Worsening of liver function, CT pelvic showed stable right pubic Rami fracture.    Subjective:    [name] today mains confused, short, vomiting, abdominal pain.      Assessment  &  Plan :    Active Problems:   Symptomatic  anemia   Hepatic cirrhosis (HCC)   Liver failure without hepatic coma (HCC)   Liver failure (HCC)   Decompensated liver cirrhosis due to alcohol abuse, with hepatic encephalopathy(hyperammonemia) and coagulopathy and alcoholic hepatitis . -GI input greatly appreciated, total bilirubin elevated at 7.7, AST at 110, ALT at 48, alk phos at 240 -With evidence of coagulopathy INR was 2.4. -Continue with Protonix IV twice daily -Started on prednisolone for alcoholic hepatitis -Continue to monitor LFTs  -Continue with rifaximin -Remains with significant coagulopathy and elevated INR despite receiving vitamin K x2, will give another dose today -With upper quadrant ultrasound significant for cirrhosis with mild ascites -Worsening ammonia level at 100 today, I will go ahead and increase her lactulose significantly to 45 g 4 times daily.  Acute on chronic anemia -No evidence of significant GI bleed, continue with Protonix. -Transfuse as needed, hemoglobin low at 7.1 this morning, will give another unit PRBC today.  Thrombocytopenia -Due to above, on SCD for DVT prophylaxis  CKD stage III -HCO3=16, started bicarb p.o. -At baseline, monitor closely as on Aldactone  Hypokalemia/hypophosphatemia -repelted  Pelvic/Rami fracture -PT consulted, continue with pain control.  Alcohol abuse -No symptoms signs of active withdrawal, CIWA protocol with as needed benzos.  COVID 19 infection -She is vaccinated and boosted, asymptomatic, no hypoxia,  remdesivir x3 days.  SpO2: 99 % (accidentally filed wrong number)  Recent Labs  Lab 02/20/20 1126 02/20/20 1324 02/20/20 1413 02/21/20 0608 02/22/20 0202 02/23/20 0043  WBC 6.5  --  6.0 6.5 4.7 6.9  PLT 42*  --  42* 43* 45* 54*  AST 123*  --  117* 110* 92* 79*  ALT 52*  --  52* 48* 43 44  ALKPHOS 261*  --  237* 240* 238* 266*  BILITOT 7.7*  --  7.5* 7.7* 7.5* 7.3*  ALBUMIN 2.2*  --  2.1* 2.0* 2.3* 2.3*  INR 2.2*  --   --  2.4* 2.8* 2.7*   SARSCOV2NAA  --  POSITIVE*  --   --   --   --        ABG     Component Value Date/Time   HCO3 17.9 (L) 02/21/2020 0608   TCO2 16 (L) 02/20/2020 1440   ACIDBASEDEF 6.0 (H) 02/21/2020 0608   O2SAT 92.2 02/21/2020 0608           Condition - Extremely Guarded  Family Communication  :  Daughter by phone 2/10, 2/11,2/12  Code Status :  Full  Consults  :  GI consult  Disposition Plan  :    Status is: Inpatient  Remains inpatient appropriate because:IV treatments appropriate due to intensity of illness or inability to take PO   Dispo: The patient is from: Home              Anticipated d/c is to: Home              Anticipated d/c date is: 2 days              Patient currently is not medically stable to d/c.   Difficult to place patient No      DVT Prophylaxis  : SCDs, no chemical DVT prophylaxis in the setting of her thrombocytopenia and coagulopathy  Lab Results  Component Value Date   PLT 54 (L) 02/23/2020    Diet :  Diet Order            Diet  Heart Room service appropriate? Yes; Fluid consistency: Thin  Diet effective now                  Inpatient Medications  Scheduled Meds: . vitamin C  1,000 mg Oral BID  . carvedilol  3.125 mg Oral BID WC  . folic acid  1 mg Oral Daily  . lactulose  40 g Oral TID AC & HS  . magnesium oxide  400 mg Oral Daily  . multivitamin with minerals  1 tablet Oral Daily  . pantoprazole (PROTONIX) IV  40 mg Intravenous Q12H  . phytonadione  10 mg Oral Once  . potassium chloride SA  20 mEq Oral BID  . prednisoLONE  40 mg Oral Daily  . rifaximin  550 mg Oral BID  . sodium bicarbonate  650 mg Oral BID  . spironolactone  25 mg Oral Daily  . thiamine  100 mg Oral Daily   Or  . thiamine  100 mg Intravenous Daily   Continuous Infusions:  PRN Meds:.oxyCODONE  Antibiotics  :    Anti-infectives (From admission, onward)   Start     Dose/Rate Route Frequency Ordered Stop   02/23/20 1000  rifaximin (XIFAXAN) tablet 550  mg        550 mg Oral 2 times daily 02/23/20 0819     02/22/20 1000  remdesivir 100 mg in sodium chloride 0.9 % 100 mL IVPB        100 mg 200 mL/hr over 30 Minutes Intravenous Daily 02/21/20 1050 02/23/20 0925   02/21/20 1145  remdesivir 200 mg in sodium chloride 0.9% 250 mL IVPB        200 mg 580 mL/hr over 30 Minutes Intravenous Once 02/21/20 1050 02/21/20 1429       Virgilene Stryker M.D on 02/23/2020 at 3:27 PM  To page go to [email or web address]   [facility] -  Office  [phone]      Objective:   Vitals:   02/22/20 1245 02/22/20 2045 02/23/20 0445 02/23/20 1330  BP: 116/70 119/66 117/65 133/73  Pulse: 87 87 83 78  Resp: _0 Temp: 97.6 F (36.4 C) 98.3 F (36.8 C) 98.3 F (36.8 C) 97.9 F (36.6 C)  TempSrc: Oral Oral Oral Oral  SpO2: 99% 95% 95% 99%  Weight:      Height:        Wt Readings from Last 3 Encounters:  02/20/20 73.9 kg  02/04/20 66.8 kg  12/25/19 65.8 kg     Intake/Output Summary (Last 24 hours) at 02/23/2020 1527 Last data filed at 02/23/2020 0800 Gross per 24 hour  Intake 940 ml  Output 400 ml  Net 540 ml     Physical Exam  Awake Alert, Oriented X 3, she is confused, slow to respond, jaundiced Symmetrical Chest wall movement, Good air movement bilaterally, CTAB RRR,No Gallops,Rubs or new Murmurs, No Parasternal Heave +ve B.Sounds, Abd Soft, No tenderness, No rebound - guarding or rigidity. No Cyanosis, Clubbing or edema, No new Rash or bruise      Data Review:    CBC Recent Labs  Lab 02/20/20 1126 02/20/20 1413 02/20/20 1440 02/21/20 0608 02/22/20 0202 02/23/20 0043  WBC 6.5 6.0  --  6.5 4.7 6.9  HGB 7.6* 7.1* 6.8* 7.4* 7.1* 8.1*  HCT 23.5* 22.7* 20.0* 23.4* 22.9* 24.1*  PLT 42* 42*  --  43* 45* 54*  MCV 100.4* 100.0  --  96.7 97.9 96.0  MCH 32.5 31.3  --  30.6 30.3 32.3  MCHC 32.3 31.3  --  31.6 31.0 33.6  RDW 18.1* 18.1*  --  18.9* 19.7* 19.7*  LYMPHSABS 0.8  --   --   --   --   --   MONOABS 0.8  --   --    --   --   --   EOSABS 0.1  --   --   --   --   --   BASOSABS 0.0  --   --   --   --   --     Recent Labs  Lab 02/20/20 1126 02/20/20 1127 02/20/20 1413 02/20/20 1440 02/21/20 0608 02/22/20 0202 02/23/20 0043  NA 139  --  138 142 138 138 138  K 3.2*  --  3.1* 3.2* 3.4* 3.8 3.9  CL 113*  --  113*  --  113* 115* 114*  CO2 16*  --  15*  --  17* 16* 16*  GLUCOSE 105*  --  90  --  95 150* 159*  BUN 19  --  18  --  _0 CREATININE 1.61*  --  1.51*  --  1.74* 1.55* 1.67*  CALCIUM 9.4  --  9.0  --  9.3 9.3 9.9  AST 123*  --  117*  --  110* 92* 79*  ALT 52*  --  52*  --  48* 43 44  ALKPHOS 261*  --  237*  --  240* 238* 266*  BILITOT 7.7*  --  7.5*  --  7.7* 7.5* 7.3*  ALBUMIN 2.2*  --  2.1*  --  2.0* 2.3* 2.3*  MG  --   --  1.9  --   --   --   --   INR 2.2*  --   --   --  2.4* 2.8* 2.7*  AMMONIA  --  45*  --   --   --   --  100*    ------------------------------------------------------------------------------------------------------------------ No results for input(s): CHOL, HDL, LDLCALC, TRIG, CHOLHDL, LDLDIRECT in the last 72 hours.  No results found for: HGBA1C ------------------------------------------------------------------------------------------------------------------ No results for input(s): TSH, T4TOTAL, T3FREE, THYROIDAB in the last 72 hours.  Invalid input(s): FREET3  Cardiac Enzymes No results for input(s): CKMB, TROPONINI, MYOGLOBIN in the last 168 hours.  Invalid input(s): CK ------------------------------------------------------------------------------------------------------------------    Component Value Date/Time   BNP 153.2 (H) 08/07/2019 0030    Micro Results Recent Results (from the past 240 hour(s))  SARS CORONAVIRUS 2 (TAT 6-24 HRS) Nasopharyngeal Nasopharyngeal Swab     Status: Abnormal   Collection Time: 02/20/20  1:24 PM   Specimen: Nasopharyngeal Swab  Result Value Ref Range Status   SARS Coronavirus 2 POSITIVE (A) NEGATIVE Final     Comment: (NOTE) SARS-CoV-2 target nucleic acids are DETECTED.  The SARS-CoV-2 RNA is generally detectable in upper and lower respiratory specimens during the acute phase of infection. Positive results are indicative of the presence of SARS-CoV-2 RNA. Clinical correlation with patient history and other diagnostic information is  necessary to determine patient infection status. Positive results do not rule out bacterial infection or co-infection with other viruses.  The expected result is Negative.  Fact Sheet for Patients: SugarRoll.be  Fact Sheet for Healthcare Providers: https://www.woods-mathews.com/  This test is not yet approved or cleared by the [place] FDA and  has been authorized for detection and/or diagnosis of SARS-CoV-2 by FDA under an Emergency Use Authorization (EUA). This EUA will remain  in effect (meaning  this test can be used) for the duration of the COVID-19 declaration under Section 564(b)(1) of the Act, 21 U. S.C. section 360bbb-3(b)(1), unless the authorization is terminated or revoked sooner.   Performed at [facility], [place] [address] [place], [place] [address]     Radiology Reports DG Chest 1 View  Result Date: 02/20/2020 CLINICAL DATA:  Fall EXAM: CHEST  1 VIEW COMPARISON:  10/10/2019 FINDINGS: Cardiac enlargement with vascular congestion. Negative for edema or effusion. Mild left lower lobe airspace disease most likely atelectasis. No acute skeletal abnormality. Chronic left rib fractures. IMPRESSION: Cardiac enlargement and mild vascular congestion. Negative for edema. Mild left lower lobe atelectasis. Electronically Signed   By: Franchot Gallo M.D.   On: 02/20/2020 14:46   CT PELVIS WO CONTRAST  Result Date: 02/20/2020 CLINICAL DATA:  Right hip pain after fall 2 days ago. EXAM: CT PELVIS WITHOUT CONTRAST TECHNIQUE: Multidetector CT imaging of the pelvis was performed following the standard protocol  without intravenous contrast. COMPARISON:  June 25, 2018. FINDINGS: Urinary Tract:  No abnormality visualized. Bowel:  Unremarkable visualized pelvic bowel loops. Vascular/Lymphatic: Atherosclerosis of abdominal aorta is noted. No adenopathy is noted. Reproductive:  No mass or other significant abnormality Other: Mild ascites is noted in the pelvis. Minimal anasarca is noted. No hernia is noted. Musculoskeletal: Minimally displaced acute fractures are seen involving the right inferior and superior pubic rami. The hip joints are unremarkable. IMPRESSION: 1. Minimally displaced acute fractures are seen involving the right inferior and superior pubic rami. 2. Mild ascites is noted in the pelvis. Minimal anasarca is noted. 3. Aortic atherosclerosis. Aortic Atherosclerosis (ICD10-I70.0). Electronically Signed   By: Marijo Conception M.D.   On: 02/20/2020 12:39   DG Hip Unilat With Pelvis 2-3 Views Right  Result Date: 02/20/2020 CLINICAL DATA:  Right hip pain after fall last night. EXAM: DG HIP (WITH OR WITHOUT PELVIS) 2-3V RIGHT COMPARISON:  None. FINDINGS: There is no evidence of hip fracture or dislocation. There is no evidence of arthropathy or other focal bone abnormality. IMPRESSION: Negative. Electronically Signed   By: Marijo Conception M.D.   On: 02/20/2020 11:03   US Abdomen Limited RUQ (LIVER/GB)  Result Date: 02/21/2020 CLINICAL DATA:  65 year old female with cirrhosis.  Abnormal LFTs. EXAM: ULTRASOUND ABDOMEN LIMITED RIGHT UPPER QUADRANT COMPARISON:  None. FINDINGS: Gallbladder: No gallstone. There is diffuse thickened appearance of the gallbladder measuring up to 5 mm, likely related to underlying liver disease. Negative sonographic Murphy's sign. Common bile duct: Diameter: 6 mm.  There is intrahepatic biliary ductal dilatation. Liver: Cirrhosis. Portal vein is patent on color Doppler imaging with normal direction of blood flow towards the liver. Other: Small ascites. There is a 5 cm right renal upper  pole cyst. Probable small right pleural effusion. IMPRESSION: 1. Cirrhosis and small ascites. 2. Patent main portal vein with hepatopetal flow. 3. Diffuse gallbladder wall thickening, likely related to cirrhosis and ascites. Electronically Signed   By: Anner Crete M.D.   On: 02/21/2020 23:47

## 2020-02-23 NOTE — Progress Notes (Addendum)
Physical Therapy Treatment Patient Details Name: Dana Watkins MRN: [id number] DOB: November 28, 1955 Today's Date: 02/23/2020    History of Present Illness Pt is a 65 y.o. female with recent fall sustaining R pubic rami fx, now admitted 02/20/20 after additional fall; imaging negative for acute injury, did show prior R pubic rami fx. Pt also with liver failure. PMH includes liver cirrhosis, ETOH abuse, chronic thrombocytopenia, CKD 3.   PT Comments    Pt progressing very well with mobility; denies BLE pain. Today's session focused on transfer and gait training, pt able to ambulate 280' with RW and min guard. Pt remains limited by generalized weakness, impaired balance strategies/postural reactions and apparent cognitive deficits (pt somewhat euphoric with decreased awareness). Discussed recommendation update to HHPT services, but pt likely still to require initial assist upon return home; pt in agreement with update from SNF, reports family/friends can provide initial support if needed. MD and CM aware of d/c rec update. Will continue to follow acutely.   Follow Up Recommendations  Home health PT;Supervision for mobility/OOB     Equipment Recommendations  3in1 (PT)    Recommendations for Other Services       Precautions / Restrictions Precautions Precautions: Fall Restrictions Other Position/Activity Restrictions: pt reports WBAT since pubic rami fx    Mobility  Bed Mobility Overal bed mobility: Modified Independent Bed Mobility: Supine to Sit                Transfers Overall transfer level: Needs assistance Equipment used: Rolling walker (2 wheeled) Transfers: Sit to/from Stand Sit to Stand: Min guard         General transfer comment: Cues for hand placement with RW, good carryover with subsequent trials; min guard for balance/safety  Ambulation/Gait Ambulation/Gait assistance: Min guard;Min assist Gait Distance (Feet): 280 Feet Assistive device: Rolling walker (2  wheeled);None Gait Pattern/deviations: Step-through pattern;Decreased stride length;Narrow base of support;Antalgic Gait velocity: Decreased   General Gait Details: Slow, mildly unsteady gait with RW and min guard for balance; cues to widen BOS and increase step length, intermittent cues to increase gait speed and increase WBAT through BLEs as pt relying heavily on BUE support initially; stability improving with cues and gait distance. Attempted a couple steps without RW support, pt reliant on minA for stability   Stairs             Wheelchair Mobility    Modified Rankin (Stroke Patients Only)       Balance Overall balance assessment: Needs assistance Sitting-balance support: No upper extremity supported;Feet supported Sitting balance-Leahy Scale: Good     Standing balance support: Bilateral upper extremity supported;No upper extremity supported Standing balance-Leahy Scale: Fair Standing balance comment: Can static stand without UE support, stability improved with UE support                            Cognition Arousal/Alertness: Awake/alert Behavior During Therapy: WFL for tasks assessed/performed Overall Cognitive Status: No family/caregiver present to determine baseline cognitive functioning Area of Impairment: Attention;Following commands;Safety/judgement;Awareness;Problem solving                   Current Attention Level: Selective   Following Commands: Follows one step commands consistently (with cues) Safety/Judgement: Decreased awareness of safety;Decreased awareness of deficits Awareness: Emergent Problem Solving: Slow processing;Requires verbal cues General Comments: Good recall of prior session. Pt somewhat euphoric(?) with decreased awareness      Exercises General Exercises - Lower Extremity Ankle  Circles/Pumps: AROM;Both;Seated Long Arc Quad: AROM;Both;Seated Hip Flexion/Marching: AROM;Both;Seated (limited range) Toe Raises:  AROM;Both;Seated Heel Raises: AROM;Both;Seated    General Comments General comments (skin integrity, edema, etc.): Discussed d/c to SNF vs. HHPT services - pt no longer necessarily requires SNF-level therapies, pt in agreement; would benefit from initial increased assist at home, pt reports family/friends available to stay with her if needed      Pertinent Vitals/Pain Pain Assessment: No/denies pain Pain Intervention(s): Monitored during session    Home Living                      Prior Function            PT Goals (current goals can now be found in the care plan section) Acute Rehab PT Goals Patient Stated Goal: Plans to d/c home instead of SNF with family/friend support PT Goal Formulation: With patient Time For Goal Achievement: 03/05/20 Progress towards PT goals: Progressing toward goals    Frequency    Min 3X/week      PT Plan Discharge plan needs to be updated    Co-evaluation              AM-PAC PT "6 Clicks" Mobility   Outcome Measure  Help needed turning from your back to your side while in a flat bed without using bedrails?: None Help needed moving from lying on your back to sitting on the side of a flat bed without using bedrails?: None Help needed moving to and from a bed to a chair (including a wheelchair)?: A Little Help needed standing up from a chair using your arms (e.g., wheelchair or bedside chair)?: A Little Help needed to walk in hospital room?: A Little Help needed climbing 3-5 steps with a railing? : A Lot 6 Click Score: 19    End of Session Equipment Utilized During Treatment: Gait belt Activity Tolerance: Patient tolerated treatment well Patient left: in chair;with call bell/phone within reach;with chair alarm set Nurse Communication: Mobility status PT Visit Diagnosis: Unsteadiness on feet (R26.81);Muscle weakness (generalized) (M62.81);History of falling (Z91.81);Pain;Difficulty in walking, not elsewhere classified  (R26.2) Pain - Right/Left: Right Pain - part of body: Hip     Time: 3016-0109 PT Time Calculation (min) (ACUTE ONLY): 25 min  Charges:  $Gait Training: 8-22 mins $Therapeutic Activity: 8-22 mins                    Mabeline Caras, PT, DPT Acute Rehabilitation Services  Pager [phone] Office [place] 02/23/2020, 4:46 PM

## 2020-02-24 LAB — COMPREHENSIVE METABOLIC PANEL
ALT: 44 U/L (ref 0–44)
AST: 75 U/L — ABNORMAL HIGH (ref 15–41)
Albumin: 2.3 g/dL — ABNORMAL LOW (ref 3.5–5.0)
Alkaline Phosphatase: 288 U/L — ABNORMAL HIGH (ref 38–126)
Anion gap: 7 (ref 5–15)
BUN: 20 mg/dL (ref 8–23)
CO2: 18 mmol/L — ABNORMAL LOW (ref 22–32)
Calcium: 9.8 mg/dL (ref 8.9–10.3)
Chloride: 114 mmol/L — ABNORMAL HIGH (ref 98–111)
Creatinine, Ser: 1.47 mg/dL — ABNORMAL HIGH (ref 0.44–1.00)
GFR, Estimated: 40 mL/min — ABNORMAL LOW (ref >60)
Glucose, Bld: 120 mg/dL — ABNORMAL HIGH (ref 70–99)
Potassium: 3.8 mmol/L (ref 3.5–5.1)
Sodium: 139 mmol/L (ref 135–145)
Total Bilirubin: 7.6 mg/dL — ABNORMAL HIGH (ref 0.3–1.2)
Total Protein: 5.3 g/dL — ABNORMAL LOW (ref 6.5–8.1)

## 2020-02-24 LAB — CBC
HCT: 26.8 % — ABNORMAL LOW (ref 36.0–46.0)
Hemoglobin: 8.5 g/dL — ABNORMAL LOW (ref 12.0–15.0)
MCH: 30.8 pg (ref 26.0–34.0)
MCHC: 31.7 g/dL (ref 30.0–36.0)
MCV: 97.1 fL (ref 80.0–100.0)
Platelets: 64 10*3/uL — ABNORMAL LOW (ref 150–400)
RBC: 2.76 MIL/uL — ABNORMAL LOW (ref 3.87–5.11)
RDW: 19.9 % — ABNORMAL HIGH (ref 11.5–15.5)
WBC: 8.4 10*3/uL (ref 4.0–10.5)
nRBC: 0 % (ref 0.0–0.2)

## 2020-02-24 LAB — PROTIME-INR
INR: 3 — ABNORMAL HIGH (ref 0.8–1.2)
Prothrombin Time: 30.1 seconds — ABNORMAL HIGH (ref 11.4–15.2)

## 2020-02-24 LAB — AMMONIA: Ammonia: 57 umol/L — ABNORMAL HIGH (ref 9–35)

## 2020-02-24 LAB — GLUCOSE, CAPILLARY: Glucose-Capillary: 161 mg/dL — ABNORMAL HIGH (ref 70–99)

## 2020-02-24 MED ORDER — PHYTONADIONE 5 MG PO TABS
10.0000 mg | ORAL_TABLET | Freq: Once | ORAL | Status: AC
  Administered 2020-02-24: 10 mg via ORAL
  Filled 2020-02-24: qty 2

## 2020-02-24 NOTE — Plan of Care (Signed)

## 2020-02-24 NOTE — Progress Notes (Signed)
PROGRESS NOTE                                                                             PROGRESS NOTE                                                                                                                                                                                                             Patient Demographics:    Dana Watkins, is a 65 y.o. female, DOB - 1955-11-20, TAV:[id number]  Outpatient Primary MD for the patient is Lawerance Cruel, MD    LOS - 4  Admit date - 02/20/2020    Chief Complaint  Patient presents with  . Hip Pain       Brief Narrative     HPI: Dana Watkins is a 65 y.o. female with medical history significant of liver cirrhosis secondary to alcohol abuse, chronic thrombocytopenia secondary to cirrhosis and remote ITP, CKD stage III, hypokalemia, presented with fall and hip pain.  Patient reported that he stepped on black ice this Monday and fell down in the parking lot.  She went to see orthopedic surgery who did outpatient imaging study showed pubic Rami fracture, stable, and patient sent home with p.o. oxycodone.  Despite taking oxycodone, patient continued to feel frequent breakthrough pain of right hip and fell down another 2-3 times since yesterday.  Denies any head injury, loss of consciousness.  Patient denied any black tarry stool or blood in the stool.  No abdominal pain.  No fever.  Patient admitted used to be a heavy drinker, but " only drinks a glass of wine here and there" and she admitted last drink was last week.  Patient also reported she has lost follow-up with GI since summer 2021, and she has been following with oncology for B12 shots.  ED Course: Worsening of liver function, CT pelvic showed stable right pubic Rami fracture.    Subjective:    [name] today no significant events overnight, she reports multiple bowel movements yesterday.  Assessment  & Plan :     Active Problems:   Symptomatic anemia   Hepatic cirrhosis (HCC)   Liver failure without hepatic coma (HCC)   Liver failure (HCC)   Decompensated liver cirrhosis due to alcohol abuse, with hepatic encephalopathy(hyperammonemia) and coagulopathy and alcoholic hepatitis . -GI input greatly appreciated, total bilirubin elevated at 7.7, AST at 110, ALT at 48, alk phos at 240 -With evidence of coagulopathy, INR continue to trend up despite multiple doses of vitamin K, will give another 10 mg a day. -Continue with Protonix IV twice daily -Started on prednisolone for alcoholic hepatitis -Continue to monitor LFTs  -Continue with rifaximin -With upper quadrant ultrasound significant for cirrhosis with mild ascites -Ona levels improving after increasing her lactulose, will continue with current dose today I will trend levels closely.    Acute on chronic anemia -No evidence of significant GI bleed, continue with Protonix. -So far required units PRBC transfusion, no significant evidence of GI bleed, hemoglobin remained stable.  Thrombocytopenia -Due to above, on SCD for DVT prophylaxis  CKD stage III -HCO3=16, started bicarb p.o. -At baseline, monitor closely as on Aldactone  Hypokalemia/hypophosphatemia -repelted  Pelvic/Rami fracture -PT consulted, continue with pain control.  Alcohol abuse -No symptoms signs of active withdrawal, CIWA protocol with as needed benzos.  COVID 19 infection -She is vaccinated and boosted, asymptomatic, no hypoxia,  remdesivir x3 days.  SpO2: 98 %  Recent Labs  Lab 02/20/20 1126 02/20/20 1324 02/20/20 1413 02/21/20 0608 02/22/20 0202 02/23/20 0043 02/24/20 0206  WBC 6.5  --  6.0 6.5 4.7 6.9 8.4  PLT 42*  --  42* 43* 45* 54* 64*  AST 123*  --  117* 110* 92* 79* 75*  ALT 52*  --  52* 48* 43 44 44  ALKPHOS 261*  --  237* 240* 238* 266* 288*  BILITOT 7.7*  --  7.5* 7.7* 7.5* 7.3* 7.6*  ALBUMIN 2.2*  --  2.1* 2.0* 2.3* 2.3* 2.3*  INR 2.2*  --    --  2.4* 2.8* 2.7* 3.0*  SARSCOV2NAA  --  POSITIVE*  --   --   --   --   --        ABG     Component Value Date/Time   HCO3 17.9 (L) 02/21/2020 0608   TCO2 16 (L) 02/20/2020 1440   ACIDBASEDEF 6.0 (H) 02/21/2020 0608   O2SAT 92.2 02/21/2020 0608           Condition - Extremely Guarded  Family Communication  :  Daughter by phone 2/10, 2/11,2/12,2/13  Code Status :  Full  Consults  :  GI consult  Disposition Plan  :    Status is: Inpatient  Remains inpatient appropriate because:IV treatments appropriate due to intensity of illness or inability to take PO   Dispo: The patient is from: Home              Anticipated d/c is to: Home              Anticipated d/c date is: 2 days              Patient currently is not medically stable to d/c.   Difficult to place patient No      DVT Prophylaxis  : SCDs, no chemical DVT prophylaxis in the setting of her thrombocytopenia and coagulopathy  Lab Results  Component Value Date   PLT 64 (L) 02/24/2020    Diet :  Diet Order  Diet Heart Room service appropriate? Yes; Fluid consistency: Thin  Diet effective now                  Inpatient Medications  Scheduled Meds: . vitamin C  1,000 mg Oral BID  . carvedilol  3.125 mg Oral BID WC  . folic acid  1 mg Oral Daily  . lactulose  40 g Oral TID AC & HS  . magnesium oxide  400 mg Oral Daily  . multivitamin with minerals  1 tablet Oral Daily  . pantoprazole (PROTONIX) IV  40 mg Intravenous Q12H  . phytonadione  10 mg Oral Once  . potassium chloride SA  20 mEq Oral BID  . prednisoLONE  40 mg Oral Daily  . rifaximin  550 mg Oral BID  . sodium bicarbonate  650 mg Oral BID  . spironolactone  25 mg Oral Daily  . thiamine  100 mg Oral Daily   Or  . thiamine  100 mg Intravenous Daily   Continuous Infusions:  PRN Meds:.oxyCODONE  Antibiotics  :    Anti-infectives (From admission, onward)   Start     Dose/Rate Route Frequency Ordered Stop   02/23/20  1000  rifaximin (XIFAXAN) tablet 550 mg        550 mg Oral 2 times daily 02/23/20 0819     02/22/20 1000  remdesivir 100 mg in sodium chloride 0.9 % 100 mL IVPB        100 mg 200 mL/hr over 30 Minutes Intravenous Daily 02/21/20 1050 02/23/20 0925   02/21/20 1145  remdesivir 200 mg in sodium chloride 0.9% 250 mL IVPB        200 mg 580 mL/hr over 30 Minutes Intravenous Once 02/21/20 1050 02/21/20 1429       Ahmoni Edge M.D on 02/24/2020 at 2:12 PM  To page go to [email or web address]   [facility] -  Office  [phone]      Objective:   Vitals:   02/23/20 0445 02/23/20 1330 02/23/20 2000 02/24/20 0352  BP: 117/65 133/73 (!) 146/73 124/69  Pulse: 83 78 86 82  Resp: 15 19 19 17   Temp: 98.3 F (36.8 C) 97.9 F (36.6 C) 98.5 F (36.9 C) 98 F (36.7 C)  TempSrc: Oral Oral Oral Oral  SpO2: 95% 99% 96% 98%  Weight:      Height:        Wt Readings from Last 3 Encounters:  02/20/20 73.9 kg  02/04/20 66.8 kg  12/25/19 65.8 kg    No intake or output data in the 24 hours ending 02/24/20 1412   Physical Exam  Awake Alert, Oriented X 3, more appropriate and coherent today  symmetrical Chest wall movement, Good air movement bilaterally, CTAB RRR,No Gallops,Rubs or new Murmurs, No Parasternal Heave +ve B.Sounds, Abd Soft, left flank bruise from fall at home.  No tenderness, No rebound - guarding or rigidity. No Cyanosis, Clubbing or edema, No new Rash or bruise       Data Review:    CBC Recent Labs  Lab 02/20/20 1126 02/20/20 1413 02/20/20 1440 02/21/20 0608 02/22/20 0202 02/23/20 0043 02/24/20 0206  WBC 6.5 6.0  --  6.5 4.7 6.9 8.4  HGB 7.6* 7.1* 6.8* 7.4* 7.1* 8.1* 8.5*  HCT 23.5* 22.7* 20.0* 23.4* 22.9* 24.1* 26.8*  PLT 42* 42*  --  43* 45* 54* 64*  MCV 100.4* 100.0  --  96.7 97.9 96.0 97.1  MCH 32.5 31.3  --  30.6 30.3 32.3 30.8  MCHC 32.3 31.3  --  31.6 31.0 33.6 31.7  RDW 18.1* 18.1*  --  18.9* 19.7* 19.7* 19.9*  LYMPHSABS 0.8  --   --   --   --    --   --   MONOABS 0.8  --   --   --   --   --   --   EOSABS 0.1  --   --   --   --   --   --   BASOSABS 0.0  --   --   --   --   --   --     Recent Labs  Lab 02/20/20 1126 02/20/20 1127 02/20/20 1413 02/20/20 1440 02/21/20 0608 02/22/20 0202 02/23/20 0043 02/24/20 0206  NA 139  --  138 142 138 138 138 139  K 3.2*  --  3.1* 3.2* 3.4* 3.8 3.9 3.8  CL 113*  --  113*  --  113* 115* 114* 114*  CO2 16*  --  15*  --  17* 16* 16* 18*  GLUCOSE 105*  --  90  --  95 150* 159* 120*  BUN 19  --  18  --  19 18 19 20   CREATININE 1.61*  --  1.51*  --  1.74* 1.55* 1.67* 1.47*  CALCIUM 9.4  --  9.0  --  9.3 9.3 9.9 9.8  AST 123*  --  117*  --  110* 92* 79* 75*  ALT 52*  --  52*  --  48* 43 44 44  ALKPHOS 261*  --  237*  --  240* 238* 266* 288*  BILITOT 7.7*  --  7.5*  --  7.7* 7.5* 7.3* 7.6*  ALBUMIN 2.2*  --  2.1*  --  2.0* 2.3* 2.3* 2.3*  MG  --   --  1.9  --   --   --   --   --   INR 2.2*  --   --   --  2.4* 2.8* 2.7* 3.0*  AMMONIA  --  45*  --   --   --   --  100* 57*    ------------------------------------------------------------------------------------------------------------------ No results for input(s): CHOL, HDL, LDLCALC, TRIG, CHOLHDL, LDLDIRECT in the last 72 hours.  No results found for: HGBA1C ------------------------------------------------------------------------------------------------------------------ No results for input(s): TSH, T4TOTAL, T3FREE, THYROIDAB in the last 72 hours.  Invalid input(s): FREET3  Cardiac Enzymes No results for input(s): CKMB, TROPONINI, MYOGLOBIN in the last 168 hours.  Invalid input(s): CK ------------------------------------------------------------------------------------------------------------------    Component Value Date/Time   BNP 153.2 (H) 08/07/2019 0030    Micro Results Recent Results (from the past 240 hour(s))  SARS CORONAVIRUS 2 (TAT 6-24 HRS) Nasopharyngeal Nasopharyngeal Swab     Status: Abnormal   Collection Time:  02/20/20  1:24 PM   Specimen: Nasopharyngeal Swab  Result Value Ref Range Status   SARS Coronavirus 2 POSITIVE (A) NEGATIVE Final    Comment: (NOTE) SARS-CoV-2 target nucleic acids are DETECTED.  The SARS-CoV-2 RNA is generally detectable in upper and lower respiratory specimens during the acute phase of infection. Positive results are indicative of the presence of SARS-CoV-2 RNA. Clinical correlation with patient history and other diagnostic information is  necessary to determine patient infection status. Positive results do not rule out bacterial infection or co-infection with other viruses.  The expected result is Negative.  Fact Sheet for Patients: SugarRoll.be  Fact Sheet for Healthcare Providers: https://www.woods-mathews.com/  This test is not yet approved or cleared by the  [place] Architectural technologist and  has been authorized for detection and/or diagnosis of SARS-CoV-2 by FDA under an Print production planner (EUA). This EUA will remain  in effect (meaning this test can be used) for the duration of the COVID-19 declaration under Section 564(b)(1) of the Act, 21 U. S.C. section 360bbb-3(b)(1), unless the authorization is terminated or revoked sooner.   Performed at [facility], [place] [address] [place], [place] [address]     Radiology Reports DG Chest 1 View  Result Date: 02/20/2020 CLINICAL DATA:  Fall EXAM: CHEST  1 VIEW COMPARISON:  10/10/2019 FINDINGS: Cardiac enlargement with vascular congestion. Negative for edema or effusion. Mild left lower lobe airspace disease most likely atelectasis. No acute skeletal abnormality. Chronic left rib fractures. IMPRESSION: Cardiac enlargement and mild vascular congestion. Negative for edema. Mild left lower lobe atelectasis. Electronically Signed   By: Franchot Gallo M.D.   On: 02/20/2020 14:46   CT PELVIS WO CONTRAST  Result Date: 02/20/2020 CLINICAL DATA:  Right hip pain after fall 2 days  ago. EXAM: CT PELVIS WITHOUT CONTRAST TECHNIQUE: Multidetector CT imaging of the pelvis was performed following the standard protocol without intravenous contrast. COMPARISON:  June 25, 2018. FINDINGS: Urinary Tract:  No abnormality visualized. Bowel:  Unremarkable visualized pelvic bowel loops. Vascular/Lymphatic: Atherosclerosis of abdominal aorta is noted. No adenopathy is noted. Reproductive:  No mass or other significant abnormality Other: Mild ascites is noted in the pelvis. Minimal anasarca is noted. No hernia is noted. Musculoskeletal: Minimally displaced acute fractures are seen involving the right inferior and superior pubic rami. The hip joints are unremarkable. IMPRESSION: 1. Minimally displaced acute fractures are seen involving the right inferior and superior pubic rami. 2. Mild ascites is noted in the pelvis. Minimal anasarca is noted. 3. Aortic atherosclerosis. Aortic Atherosclerosis (ICD10-I70.0). Electronically Signed   By: Marijo Conception M.D.   On: 02/20/2020 12:39   DG Hip Unilat With Pelvis 2-3 Views Right  Result Date: 02/20/2020 CLINICAL DATA:  Right hip pain after fall last night. EXAM: DG HIP (WITH OR WITHOUT PELVIS) 2-3V RIGHT COMPARISON:  None. FINDINGS: There is no evidence of hip fracture or dislocation. There is no evidence of arthropathy or other focal bone abnormality. IMPRESSION: Negative. Electronically Signed   By: Marijo Conception M.D.   On: 02/20/2020 11:03   US Abdomen Limited RUQ (LIVER/GB)  Result Date: 02/21/2020 CLINICAL DATA:  65 year old female with cirrhosis.  Abnormal LFTs. EXAM: ULTRASOUND ABDOMEN LIMITED RIGHT UPPER QUADRANT COMPARISON:  None. FINDINGS: Gallbladder: No gallstone. There is diffuse thickened appearance of the gallbladder measuring up to 5 mm, likely related to underlying liver disease. Negative sonographic Murphy's sign. Common bile duct: Diameter: 6 mm.  There is intrahepatic biliary ductal dilatation. Liver: Cirrhosis. Portal vein is patent on  color Doppler imaging with normal direction of blood flow towards the liver. Other: Small ascites. There is a 5 cm right renal upper pole cyst. Probable small right pleural effusion. IMPRESSION: 1. Cirrhosis and small ascites. 2. Patent main portal vein with hepatopetal flow. 3. Diffuse gallbladder wall thickening, likely related to cirrhosis and ascites. Electronically Signed   By: Anner Crete M.D.   On: 02/21/2020 23:47

## 2020-02-25 DIAGNOSIS — I4891 Unspecified atrial fibrillation: Secondary | ICD-10-CM

## 2020-02-25 DIAGNOSIS — R7989 Other specified abnormal findings of blood chemistry: Secondary | ICD-10-CM

## 2020-02-25 DIAGNOSIS — R945 Abnormal results of liver function studies: Secondary | ICD-10-CM

## 2020-02-25 LAB — COMPREHENSIVE METABOLIC PANEL
ALT: 46 U/L — ABNORMAL HIGH (ref 0–44)
AST: 74 U/L — ABNORMAL HIGH (ref 15–41)
Albumin: 2.1 g/dL — ABNORMAL LOW (ref 3.5–5.0)
Alkaline Phosphatase: 270 U/L — ABNORMAL HIGH (ref 38–126)
Anion gap: 6 (ref 5–15)
BUN: 22 mg/dL (ref 8–23)
CO2: 17 mmol/L — ABNORMAL LOW (ref 22–32)
Calcium: 10 mg/dL (ref 8.9–10.3)
Chloride: 117 mmol/L — ABNORMAL HIGH (ref 98–111)
Creatinine, Ser: 1.53 mg/dL — ABNORMAL HIGH (ref 0.44–1.00)
GFR, Estimated: 38 mL/min — ABNORMAL LOW (ref >60)
Glucose, Bld: 102 mg/dL — ABNORMAL HIGH (ref 70–99)
Potassium: 4.5 mmol/L (ref 3.5–5.1)
Sodium: 140 mmol/L (ref 135–145)
Total Bilirubin: 7.2 mg/dL — ABNORMAL HIGH (ref 0.3–1.2)
Total Protein: 4.9 g/dL — ABNORMAL LOW (ref 6.5–8.1)

## 2020-02-25 LAB — CBC
HCT: 27.3 % — ABNORMAL LOW (ref 36.0–46.0)
Hemoglobin: 8.6 g/dL — ABNORMAL LOW (ref 12.0–15.0)
MCH: 31.2 pg (ref 26.0–34.0)
MCHC: 31.5 g/dL (ref 30.0–36.0)
MCV: 98.9 fL (ref 80.0–100.0)
Platelets: 72 10*3/uL — ABNORMAL LOW (ref 150–400)
RBC: 2.76 MIL/uL — ABNORMAL LOW (ref 3.87–5.11)
RDW: 20.4 % — ABNORMAL HIGH (ref 11.5–15.5)
WBC: 9.3 10*3/uL (ref 4.0–10.5)
nRBC: 0 % (ref 0.0–0.2)

## 2020-02-25 LAB — MAGNESIUM: Magnesium: 2.1 mg/dL (ref 1.7–2.4)

## 2020-02-25 LAB — PROTIME-INR
INR: 2.8 — ABNORMAL HIGH (ref 0.8–1.2)
Prothrombin Time: 28.9 seconds — ABNORMAL HIGH (ref 11.4–15.2)

## 2020-02-25 LAB — AMMONIA: Ammonia: 74 umol/L — ABNORMAL HIGH (ref 9–35)

## 2020-02-25 LAB — TROPONIN I (HIGH SENSITIVITY)
Troponin I (High Sensitivity): 18 ng/L — ABNORMAL HIGH (ref <18)
Troponin I (High Sensitivity): 12 ng/L (ref <18)

## 2020-02-25 LAB — TSH: TSH: 0.661 u[IU]/mL (ref 0.350–4.500)

## 2020-02-25 MED ORDER — DILTIAZEM HCL-DEXTROSE 125-5 MG/125ML-% IV SOLN (PREMIX)
5.0000 mg/h | INTRAVENOUS | Status: DC
  Administered 2020-02-25: 5 mg/h via INTRAVENOUS
  Administered 2020-02-25 – 2020-02-26 (×2): 10 mg/h via INTRAVENOUS
  Filled 2020-02-25 (×4): qty 125

## 2020-02-25 MED ORDER — CARVEDILOL 6.25 MG PO TABS
6.2500 mg | ORAL_TABLET | Freq: Two times a day (BID) | ORAL | Status: DC
  Administered 2020-02-25 – 2020-02-27 (×6): 6.25 mg via ORAL
  Filled 2020-02-25 (×7): qty 1

## 2020-02-25 MED ORDER — DIGOXIN 0.25 MG/ML IJ SOLN
0.2500 mg | Freq: Four times a day (QID) | INTRAMUSCULAR | Status: AC
Start: 2020-02-25 — End: 2020-02-25
  Administered 2020-02-25 (×2): 0.25 mg via INTRAVENOUS
  Filled 2020-02-25 (×2): qty 2

## 2020-02-25 MED ORDER — ONDANSETRON HCL 4 MG/2ML IJ SOLN
4.0000 mg | Freq: Four times a day (QID) | INTRAMUSCULAR | Status: DC | PRN
  Administered 2020-02-25: 4 mg via INTRAVENOUS
  Filled 2020-02-25: qty 2

## 2020-02-25 MED ORDER — DILTIAZEM LOAD VIA INFUSION
10.0000 mg | Freq: Once | INTRAVENOUS | Status: AC
  Administered 2020-02-25: 10 mg via INTRAVENOUS
  Filled 2020-02-25: qty 10

## 2020-02-25 MED ORDER — LACTULOSE 10 GM/15ML PO SOLN
40.0000 g | Freq: Every day | ORAL | Status: DC
  Filled 2020-02-25: qty 60

## 2020-02-25 MED ORDER — LACTULOSE 10 GM/15ML PO SOLN: 30.0000 g | Freq: Two times a day (BID) | ORAL | Status: DC | PRN

## 2020-02-25 MED ORDER — METOPROLOL TARTRATE 5 MG/5ML IV SOLN
5.0000 mg | INTRAVENOUS | Status: DC | PRN
  Administered 2020-02-25 (×3): 5 mg via INTRAVENOUS
  Filled 2020-02-25 (×3): qty 5

## 2020-02-25 NOTE — Progress Notes (Signed)
PROGRESS NOTE                                                                             PROGRESS NOTE                                                                                                                                                                                                             Patient Demographics:    Dana Watkins, is a 65 y.o. female, DOB - 09-14-1955, ZJI:[id number]  Outpatient Primary MD for the patient is Dana Cruel, MD    LOS - 5  Admit date - 02/20/2020    Chief Complaint  Patient presents with  . Hip Pain       Brief Narrative     HPI: Dana Watkins is a 65 y.o. female with medical history significant of liver cirrhosis secondary to alcohol abuse, chronic thrombocytopenia secondary to cirrhosis and remote ITP, CKD stage III, hypokalemia, patient with recent history of fall, pubic ramus fracture last week with recommendation for conservative management by her orthopedic, she presents to ED with complaints of hip pain, generalized weakness, her work-up was significant for decompensated liver failure, symptomatic anemia required units PRBC transfusion, as well she did develop A. fib with RVR on 2/14 .   Subjective:    Dana Watkins today ports multiple bowel movements yesterday, she went into A. fib with RVR overnight.     Assessment  & Plan :    Active Problems:   Symptomatic anemia   Hepatic cirrhosis (HCC)   Liver failure without hepatic coma (HCC)   Liver failure (HCC)   Abnormal liver function tests   Decompensated liver cirrhosis due to alcohol abuse, with hepatic encephalopathy(hyperammonemia) and coagulopathy and alcoholic hepatitis . -With evidence of coagulopathy, INR continue to trend up despite multiple doses of vitamin K, continue to monitor her INR closely. -Continue with Protonix IV twice daily -Started on prednisolone for alcoholic hepatitis -Continue with rifaximin, Aldactone  and beta-blockers. -With upper quadrant ultrasound significant for cirrhosis with  mild ascites -New level is elevated, lactulose dose has been adjusted given multiple bowel movements yesterday, will monitor ammonia closely. -She is with very poor prognosis given the degree of her decompensated liver failure, and her denial regarding the gravity of her liver disease and amount of alcohol consumption.  A. fib with RVR -Developed A. fib with RVR 2/14 a.m., no improvement despite multiple IV metoprolol pushes, she is started on Cardizem drip, heart rate has improved, continue with p.o. Coreg, I have increased. -He is not anticoagulation candidate in the setting of her thrombocytopenia, liver disease and coagulopathy. - TSH , and and magnesium within normal limits.  Acute on chronic anemia -No evidence of significant GI bleed, continue with Protonix. -So far required units PRBC transfusion, no significant evidence of GI bleed, hemoglobin remained stable.  Thrombocytopenia -Due to above, on SCD for DVT prophylaxis  CKD stage III -Bicarb is low on admission, this has resolved, will discontinue oral bicarb. -At baseline, monitor closely as on Aldactone  Hypokalemia/hypophosphatemia -repelted  Pelvic/Rami fracture -PT consulted, continue with pain control.  Alcohol abuse -No symptoms signs of active withdrawal, CIWA protocol with as needed benzos.  COVID 19 infection -She is vaccinated and boosted, asymptomatic, no hypoxia,  remdesivir x3 days.  SpO2: 95 % O2 Flow Rate (L/min): 2 L/min  Recent Labs  Lab 02/20/20 1324 02/20/20 1413 02/21/20 0608 02/22/20 0202 02/23/20 0043 02/24/20 0206 02/25/20 0428  WBC  --    < > 6.5 4.7 6.9 8.4 9.3  PLT  --    < > 43* 45* 54* 64* 72*  AST  --    < > 110* 92* 79* 75* 74*  ALT  --    < > 48* 43 44 44 46*  ALKPHOS  --    < > 240* 238* 266* 288* 270*  BILITOT  --    < > 7.7* 7.5* 7.3* 7.6* 7.2*  ALBUMIN  --    < > 2.0* 2.3* 2.3* 2.3* 2.1*   INR  --   --  2.4* 2.8* 2.7* 3.0* 2.8*  SARSCOV2NAA POSITIVE*  --   --   --   --   --   --    < > = values in this interval not displayed.       ABG     Component Value Date/Time   HCO3 17.9 (L) 02/21/2020 0608   TCO2 16 (L) 02/20/2020 1440   ACIDBASEDEF 6.0 (H) 02/21/2020 0608   O2SAT 92.2 02/21/2020 0608      Goals of care: -Patient about her advanced liver failure, and her alcohol consumption, she is in some denial, I have discussed with her daughter, who understands the gravity of her mother's condition, and advanced liver failure, plan is to consult palliative medicine regarding goals of care, goals of care status discussion, if she deteriorates, and to discuss CODE STATUS.     Condition - Extremely Guarded  Family Communication  :  Daughter by phone 2/10, 2/11,2/12,2/13.2/14  Code Status :  Full  Consults  :  GI consult  Disposition Plan  :    Status is: Inpatient  Remains inpatient appropriate because:IV treatments appropriate due to intensity of illness or inability to take PO   Dispo: The patient is from: Home              Anticipated d/c is to: Home              Anticipated d/c date is: 2 days  Patient currently is not medically stable to d/c.   Difficult to place patient No      DVT Prophylaxis  : SCDs, no chemical DVT prophylaxis in the setting of her thrombocytopenia and coagulopathy  Lab Results  Component Value Date   PLT 72 (L) 02/25/2020    Diet :  Diet Order            Diet Heart Room service appropriate? Yes; Fluid consistency: Thin  Diet effective now                  Inpatient Medications  Scheduled Meds: . vitamin C  1,000 mg Oral BID  . carvedilol  6.25 mg Oral BID WC  . digoxin  0.25 mg Intravenous O2D  . folic acid  1 mg Oral Daily  . magnesium oxide  400 mg Oral Daily  . multivitamin with minerals  1 tablet Oral Daily  . pantoprazole (PROTONIX) IV  40 mg Intravenous Q12H  . potassium chloride SA  20  mEq Oral BID  . prednisoLONE  40 mg Oral Daily  . rifaximin  550 mg Oral BID  . sodium bicarbonate  650 mg Oral BID  . spironolactone  25 mg Oral Daily  . thiamine  100 mg Oral Daily   Or  . thiamine  100 mg Intravenous Daily   Continuous Infusions: . diltiazem (CARDIZEM) infusion 10 mg/hr (02/25/20 0925)   PRN Meds:.lactulose, metoprolol tartrate, oxyCODONE  Antibiotics  :    Anti-infectives (From admission, onward)   Start     Dose/Rate Route Frequency Ordered Stop   02/23/20 1000  rifaximin (XIFAXAN) tablet 550 mg        550 mg Oral 2 times daily 02/23/20 0819     02/22/20 1000  remdesivir 100 mg in sodium chloride 0.9 % 100 mL IVPB        100 mg 200 mL/hr over 30 Minutes Intravenous Daily 02/21/20 1050 02/23/20 0925   02/21/20 1145  remdesivir 200 mg in sodium chloride 0.9% 250 mL IVPB        200 mg 580 mL/hr over 30 Minutes Intravenous Once 02/21/20 1050 02/21/20 1429       Beatriz Quintela M.D on 02/25/2020 at 1:21 PM  To page go to [email or web address]   [facility] -  Office  [phone]      Objective:   Vitals:   02/25/20 0803 02/25/20 0840 02/25/20 0939 02/25/20 1058  BP: 105/63 98/64 116/63 100/71  Pulse: 97 (!) 127 (!) 114 (!) 111  Resp: 14 16 15 17   Temp: 97.7 F (36.5 C) 98 F (36.7 C) 98 F (36.7 C) 98 F (36.7 C)  TempSrc: Oral Oral Oral Oral  SpO2: 97% 94% 96% 95%  Weight:      Height:        Wt Readings from Last 3 Encounters:  02/20/20 73.9 kg  02/04/20 66.8 kg  12/25/19 65.8 kg     Intake/Output Summary (Last 24 hours) at 02/25/2020 1321 Last data filed at 02/25/2020 0841 Gross per 24 hour  Intake 120 ml  Output -  Net 120 ml     Physical Exam  Awake Alert, Oriented X 3, she is more appropriate and coherent today Symmetrical Chest wall movement, Good air movement bilaterally, CTAB RRR,No Gallops,Rubs or new Murmurs, No Parasternal Heave +ve B.Sounds, Abd Soft, No tenderness, No rebound - guarding or rigidity. No  Cyanosis, Clubbing or edema, No new Rash or bruise  Data Review:    CBC Recent Labs  Lab 02/20/20 1126 02/20/20 1413 02/21/20 0608 02/22/20 0202 02/23/20 0043 02/24/20 0206 02/25/20 0428  WBC 6.5   < > 6.5 4.7 6.9 8.4 9.3  HGB 7.6*   < > 7.4* 7.1* 8.1* 8.5* 8.6*  HCT 23.5*   < > 23.4* 22.9* 24.1* 26.8* 27.3*  PLT 42*   < > 43* 45* 54* 64* 72*  MCV 100.4*   < > 96.7 97.9 96.0 97.1 98.9  MCH 32.5   < > 30.6 30.3 32.3 30.8 31.2  MCHC 32.3   < > 31.6 31.0 33.6 31.7 31.5  RDW 18.1*   < > 18.9* 19.7* 19.7* 19.9* 20.4*  LYMPHSABS 0.8  --   --   --   --   --   --   MONOABS 0.8  --   --   --   --   --   --   EOSABS 0.1  --   --   --   --   --   --   BASOSABS 0.0  --   --   --   --   --   --    < > = values in this interval not displayed.    Recent Labs  Lab 02/20/20 1127 02/20/20 1413 02/20/20 1440 02/21/20 5462 02/22/20 0202 02/23/20 0043 02/24/20 0206 02/25/20 0428 02/25/20 0712 02/25/20 0713  NA  --  138   < > 138 138 138 139 140  --   --   K  --  3.1*   < > 3.4* 3.8 3.9 3.8 4.5  --   --   CL  --  113*  --  113* 115* 114* 114* 117*  --   --   CO2  --  15*  --  17* 16* 16* 18* 17*  --   --   GLUCOSE  --  90  --  95 150* 159* 120* 102*  --   --   BUN  --  18  --  19 18 19 20 22   --   --   CREATININE  --  1.51*  --  1.74* 1.55* 1.67* 1.47* 1.53*  --   --   CALCIUM  --  9.0  --  9.3 9.3 9.9 9.8 10.0  --   --   AST  --  117*  --  110* 92* 79* 75* 74*  --   --   ALT  --  52*  --  48* 43 44 44 46*  --   --   ALKPHOS  --  237*  --  240* 238* 266* 288* 270*  --   --   BILITOT  --  7.5*  --  7.7* 7.5* 7.3* 7.6* 7.2*  --   --   ALBUMIN  --  2.1*  --  2.0* 2.3* 2.3* 2.3* 2.1*  --   --   MG  --  1.9  --   --   --   --   --   --   --  2.1  INR  --   --   --  2.4* 2.8* 2.7* 3.0* 2.8*  --   --   TSH  --   --   --   --   --   --   --   --  0.661  --   AMMONIA 45*  --   --   --   --  100*  57* 74*  --   --    < > = values in this interval not displayed.     ------------------------------------------------------------------------------------------------------------------ No results for input(s): CHOL, HDL, LDLCALC, TRIG, CHOLHDL, LDLDIRECT in the last 72 hours.  No results found for: HGBA1C ------------------------------------------------------------------------------------------------------------------ Recent Labs    02/25/20 0712  TSH 0.661    Cardiac Enzymes No results for input(s): CKMB, TROPONINI, MYOGLOBIN in the last 168 hours.  Invalid input(s): CK ------------------------------------------------------------------------------------------------------------------    Component Value Date/Time   BNP 153.2 (H) 08/07/2019 0030    Micro Results Recent Results (from the past 240 hour(s))  SARS CORONAVIRUS 2 (TAT 6-24 HRS) Nasopharyngeal Nasopharyngeal Swab     Status: Abnormal   Collection Time: 02/20/20  1:24 PM   Specimen: Nasopharyngeal Swab  Result Value Ref Range Status   SARS Coronavirus 2 POSITIVE (A) NEGATIVE Final    Comment: (NOTE) SARS-CoV-2 target nucleic acids are DETECTED.  The SARS-CoV-2 RNA is generally detectable in upper and lower respiratory specimens during the acute phase of infection. Positive results are indicative of the presence of SARS-CoV-2 RNA. Clinical correlation with patient history and other diagnostic information is  necessary to determine patient infection status. Positive results do not rule out bacterial infection or co-infection with other viruses.  The expected result is Negative.  Fact Sheet for Patients: SugarRoll.be  Fact Sheet for Healthcare Providers: https://www.woods-mathews.com/  This test is not yet approved or cleared by the [place] FDA and  has been authorized for detection and/or diagnosis of SARS-CoV-2 by FDA under an Emergency Use Authorization (EUA). This EUA will remain  in effect (meaning this test can be used) for  the duration of the COVID-19 declaration under Section 564(b)(1) of the Act, 21 U. S.C. section 360bbb-3(b)(1), unless the authorization is terminated or revoked sooner.   Performed at [facility], [place] [address] [place], [place] [address]     Radiology Reports DG Chest 1 View  Result Date: 02/20/2020 CLINICAL DATA:  Fall EXAM: CHEST  1 VIEW COMPARISON:  10/10/2019 FINDINGS: Cardiac enlargement with vascular congestion. Negative for edema or effusion. Mild left lower lobe airspace disease most likely atelectasis. No acute skeletal abnormality. Chronic left rib fractures. IMPRESSION: Cardiac enlargement and mild vascular congestion. Negative for edema. Mild left lower lobe atelectasis. Electronically Signed   By: Franchot Gallo M.D.   On: 02/20/2020 14:46   CT PELVIS WO CONTRAST  Result Date: 02/20/2020 CLINICAL DATA:  Right hip pain after fall 2 days ago. EXAM: CT PELVIS WITHOUT CONTRAST TECHNIQUE: Multidetector CT imaging of the pelvis was performed following the standard protocol without intravenous contrast. COMPARISON:  June 25, 2018. FINDINGS: Urinary Tract:  No abnormality visualized. Bowel:  Unremarkable visualized pelvic bowel loops. Vascular/Lymphatic: Atherosclerosis of abdominal aorta is noted. No adenopathy is noted. Reproductive:  No mass or other significant abnormality Other: Mild ascites is noted in the pelvis. Minimal anasarca is noted. No hernia is noted. Musculoskeletal: Minimally displaced acute fractures are seen involving the right inferior and superior pubic rami. The hip joints are unremarkable. IMPRESSION: 1. Minimally displaced acute fractures are seen involving the right inferior and superior pubic rami. 2. Mild ascites is noted in the pelvis. Minimal anasarca is noted. 3. Aortic atherosclerosis. Aortic Atherosclerosis (ICD10-I70.0). Electronically Signed   By: Marijo Conception M.D.   On: 02/20/2020 12:39   DG Hip Unilat With Pelvis 2-3 Views Right  Result Date:  02/20/2020 CLINICAL DATA:  Right hip pain after fall last night. EXAM: DG HIP (  WITH OR WITHOUT PELVIS) 2-3V RIGHT COMPARISON:  None. FINDINGS: There is no evidence of hip fracture or dislocation. There is no evidence of arthropathy or other focal bone abnormality. IMPRESSION: Negative. Electronically Signed   By: Marijo Conception M.D.   On: 02/20/2020 11:03   US Abdomen Limited RUQ (LIVER/GB)  Result Date: 02/21/2020 CLINICAL DATA:  65 year old female with cirrhosis.  Abnormal LFTs. EXAM: ULTRASOUND ABDOMEN LIMITED RIGHT UPPER QUADRANT COMPARISON:  None. FINDINGS: Gallbladder: No gallstone. There is diffuse thickened appearance of the gallbladder measuring up to 5 mm, likely related to underlying liver disease. Negative sonographic Murphy's sign. Common bile duct: Diameter: 6 mm.  There is intrahepatic biliary ductal dilatation. Liver: Cirrhosis. Portal vein is patent on color Doppler imaging with normal direction of blood flow towards the liver. Other: Small ascites. There is a 5 cm right renal upper pole cyst. Probable small right pleural effusion. IMPRESSION: 1. Cirrhosis and small ascites. 2. Patent main portal vein with hepatopetal flow. 3. Diffuse gallbladder wall thickening, likely related to cirrhosis and ascites. Electronically Signed   By: Anner Crete M.D.   On: 02/21/2020 23:47

## 2020-02-25 NOTE — Progress Notes (Signed)
Physical Therapy Treatment Patient Details Name: Dana Watkins MRN: [id number] DOB: 03-19-1955 Today's Date: 02/25/2020    History of Present Illness Pt is a 65 y.o. female with recent fall sustaining R pubic rami fx, now admitted 02/20/20 after additional fall; imaging negative for acute injury, did show prior R pubic rami fx. Pt also with liver failure. PMH includes liver cirrhosis, ETOH abuse, chronic thrombocytopenia, CKD 3.   PT Comments    Pt progressing with mobility. Today's session focused on gait training with RW and LE strengthening. Pt remains limited by generalized weakness and impaired balance, as well as decreased awareness; pt requires min guard and RW due to instability. Pt remains motivated for d/c home, although "all of" her friends/family have COVID right now; pt encouraged to still attempt to find initial 24/7 support for safe return home.   Pt with initial hypotension, reports asymptomatic and states, "My blood pressure is usually low"  Vitals Sitting BP 85/52, HR 109  Standing 96/67, HR 132  Post-ambulation 116/80, HR 112-120s     Follow Up Recommendations  Home health PT;Supervision for mobility/OOB; HH aide     Equipment Recommendations  3in1 (PT)    Recommendations for Other Services       Precautions / Restrictions Precautions Precautions: Fall    Mobility  Bed Mobility               General bed mobility comments: Received sitting in recliner    Transfers Overall transfer level: Needs assistance Equipment used: Rolling walker (2 wheeled) Transfers: Sit to/from Stand Sit to Stand: Supervision            Ambulation/Gait Ambulation/Gait assistance: Min guard Gait Distance (Feet): 500 Feet Assistive device: Rolling walker (2 wheeled) Gait Pattern/deviations: Step-through pattern;Decreased stride length;Narrow base of support;Antalgic Gait velocity: Decreased   General Gait Details: Slow, mostly steady gait with RW and  intermittent min guard for balance; initial cues to widen BOS and increase step length. Pt frequently turning head and body walking in hallway to look around and into rooms, no overt instability with this, but cued to turn RW with body to improve stability. Improving WBAT through RW with less reliance on significant BUE support with RW   Stairs             Wheelchair Mobility    Modified Rankin (Stroke Patients Only)       Balance Overall balance assessment: Needs assistance Sitting-balance support: No upper extremity supported;Feet supported Sitting balance-Leahy Scale: Good     Standing balance support: Bilateral upper extremity supported;No upper extremity supported Standing balance-Leahy Scale: Fair Standing balance comment: Can static stand without UE support, stability improved with UE support                            Cognition Arousal/Alertness: Awake/alert Behavior During Therapy: WFL for tasks assessed/performed;Restless Overall Cognitive Status: No family/caregiver present to determine baseline cognitive functioning Area of Impairment: Attention;Memory;Safety/judgement;Awareness;Problem solving                   Current Attention Level: Selective Memory: Decreased short-term memory   Safety/Judgement: Decreased awareness of safety Awareness: Emergent Problem Solving: Requires verbal cues General Comments: Pt easily distracted wanting to look in every pt room when passing by in hallway, "Oh I didn't realize there are men and women up here"      Exercises General Exercises - Lower Extremity Long Arc Quad: AROM;Both;Seated Hip Flexion/Marching: AROM;Both;Seated  Toe Raises: AROM;Both;Seated Heel Raises: AROM;Both;Seated    General Comments General comments (skin integrity, edema, etc.): Seated BP 85/52, HR 109; standing BP 96/67, HR up to 132; post-walk BP 116/80, HR 112-120s. Pt asymptomatic with hypotension, reports, "My blood pressure  is usually low." Time discussing d/c plans - pt reports majority of family and friends are sick with COVID right now; daughters work but would be able to provide transportation; pt working on finding initial assist. Pt aware she should not be driving right now and will need some extra help at home initially      Pertinent Vitals/Pain Pain Assessment: Faces Faces Pain Scale: Hurts a little bit Pain Location: pelvis, bilateral hips Pain Descriptors / Indicators: Sore Pain Intervention(s): Monitored during session    Home Living                      Prior Function            PT Goals (current goals can now be found in the care plan section) Progress towards PT goals: Progressing toward goals    Frequency    Min 3X/week      PT Plan Current plan remains appropriate    Co-evaluation              AM-PAC PT "6 Clicks" Mobility   Outcome Measure  Help needed turning from your back to your side while in a flat bed without using bedrails?: None Help needed moving from lying on your back to sitting on the side of a flat bed without using bedrails?: None Help needed moving to and from a bed to a chair (including a wheelchair)?: A Little Help needed standing up from a chair using your arms (e.g., wheelchair or bedside chair)?: A Little Help needed to walk in hospital room?: A Little Help needed climbing 3-5 steps with a railing? : A Little 6 Click Score: 20    End of Session Equipment Utilized During Treatment: Gait belt Activity Tolerance: Patient tolerated treatment well Patient left: in chair;with call bell/phone within reach;with chair alarm set Nurse Communication: Mobility status PT Visit Diagnosis: Unsteadiness on feet (R26.81);Muscle weakness (generalized) (M62.81);History of falling (Z91.81);Pain;Difficulty in walking, not elsewhere classified (R26.2)     Time: 4481-8563 PT Time Calculation (min) (ACUTE ONLY): 23 min  Charges:  $Gait Training: 8-22  mins $Therapeutic Exercise: 8-22 mins                     Mabeline Caras, PT, DPT Acute Rehabilitation Services  Pager [phone] Office [place] 02/25/2020, 2:18 PM

## 2020-02-25 NOTE — Progress Notes (Signed)
Patient's mental status is markedly improved.  She is sitting up in bed, chipper, good spirits, oriented x3, very personable.  No asterixis.    The patient has had innumerable bowel movements over the past 24 hours in association with high-dose lactulose therapy.  Liver chemistries and MELD score are essentially unchanged over the past 4 days.    MELD scoring, recalculated with today's labs, is 29, which is identical to what it was 4 days ago.  Ammonia remains elevated at 74, despite rifaximin and lactulose.  Recommendations:  1.  I will decrease the patient's lactulose.  I will attempt to write the order in such a way that the patient is not having excessive bowel movements but if that appears to be the case, further decreases in lactulose, or even holding it entirely, it may be needed.  2.  Patient seems to be in denial regarding the gravity of her liver disease and the amount of her alcohol consumption.  Therefore, poor prognosis going forward, even if she gets out of the hospital on this admission, which now appears likely.  3.  We will follow at a distance, since there will probably be little in the way of day-to-day change.  Call us, however, at any time if earlier input is desired.  Cleotis Nipper, M.D. Pager [phone] If no answer or after 5 PM call [phone]

## 2020-02-25 NOTE — Progress Notes (Signed)
Patient complained of chest and jaw  Tightness. HR sustaining in 140s. RN  Informed MD and completed EKG which verified Afib with RVR - see orders.

## 2020-02-25 NOTE — Progress Notes (Addendum)
Occupational Therapy Evaluation Patient Details Name: Dana Watkins MRN: [id number] DOB: [birth year] Today's Date: 02/25/2020    History of Present Illness Pt is a 65 y.o. female with recent fall sustaining R pubic rami fx, now admitted 02/20/20 after additional fall; imaging negative for acute injury, did show prior R pubic rami fx. Pt also with liver failure. PMH includes liver cirrhosis, ETOH abuse, chronic thrombocytopenia, CKD 3.   Clinical Impression   PTA pt living at home alone. Recent fall on "black ice" with pubic rami fx. Pt tangential at times during session with decreased safety awareness of how her liver failure is affecting her current lifestyle. Pt states she feels that she can go home, then states she feels that she needs rehab. Pt states she "no longer has Covid so she should be able to go to rehab".  Pt minguard with use of RW for mobility and ADL. Recommend DC home with S for mobility, ADL , IADL tasks and medication management and follow up [place]. [place] would be beneficial if available. Will follow acutely.  HR 112 - 143 (not sustained); BP 100/70; SpO2 97 RA.  Follow Up Recommendations  Home health OT (S with mobility, ADL and IADL tasks, including medication management; no driving)    Equipment Recommendations  3 in 1 bedside commode    Recommendations for Other Services       Precautions / Restrictions Precautions Precautions: Fall Restrictions Weight Bearing Restrictions: No      Mobility Bed Mobility Overal bed mobility: Modified Independent                  Transfers Overall transfer level: Needs assistance   Transfers: Sit to/from Stand Sit to Stand: Supervision              Balance Overall balance assessment: Needs assistance   Sitting balance-Leahy Scale: Good       Standing balance-Leahy Scale: Fair                             ADL either performed or assessed with clinical judgement   ADL Overall ADL's : Needs  assistance/impaired     Grooming: Set up;Sitting   Upper Body Bathing: Set up;Sitting   Lower Body Bathing: Min guard;Sit to/from stand   Upper Body Dressing : Set up;Sitting   Lower Body Dressing: Min guard;Sit to/from stand   Toilet Transfer: Min guard;Ambulation;BSC;RW   Toileting- Water quality scientist and Hygiene: Min guard;Sit to/from stand       Functional mobility during ADLs: Min guard;Rolling walker;Cueing for safety General ADL Comments: Pt able to recall previous information from PT regarding how to correctly use RW     Vision         Perception     Praxis      Pertinent Vitals/Pain Pain Assessment: Faces Faces Pain Scale: Hurts a little bit Pain Location: pelvis, bilateral hips Pain Descriptors / Indicators: Guarding;Grimacing Pain Intervention(s): Limited activity within patient's tolerance     Hand Dominance Right   Extremity/Trunk Assessment Upper Extremity Assessment Upper Extremity Assessment: Overall WFL for tasks assessed   Lower Extremity Assessment Lower Extremity Assessment: Defer to PT evaluation RLE Deficits / Details:  (Able to place B feet onto commode when donning socks withotu c/o pain)   Cervical / Trunk Assessment Cervical / Trunk Assessment: Normal   Communication Communication Communication: No difficulties   Cognition Arousal/Alertness: Awake/alert Behavior During Therapy: Restless Overall Cognitive Status:  No family/caregiver present to determine baseline cognitive functioning Area of Impairment: Safety/judgement;Awareness;Memory;Attention                   Current Attention Level: Selective Memory: Decreased short-term memory   Safety/Judgement: Decreased awareness of safety Awareness: Emergent Problem Solving: Slow processing General Comments: Pt states "they told me not to get up but they forgot to put on the alarm so I got up anyway; I didn't make it to the toilet in time so I stepped all in it"    General Comments       Exercises     Shoulder Instructions      Home Living Family/patient expects to be discharged to:: Private residence Living Arrangements: Alone Available Help at Discharge: Family;Friend(s);Available 24 hours/day Type of Home:  (condo) Home Access: Level entry     Home Layout: One level     Bathroom Shower/Tub: Walk-in shower;Tub/shower unit   Bathroom Toilet: Standard Bathroom Accessibility: Yes How Accessible: Accessible via walker Home Equipment: Walker - 2 wheels;Grab bars - tub/shower;Hand held shower head          Prior Functioning/Environment Level of Independence: Independent  Gait / Transfers Assistance Needed: Prior to initial fall, pt was independent. After falling on black ice, pt has been using RW, but has reported increased difficulty.              OT Problem List: Decreased strength;Decreased activity tolerance;Impaired balance (sitting and/or standing);Decreased cognition;Decreased safety awareness;Decreased knowledge of use of DME or AE;Cardiopulmonary status limiting activity;Pain      OT Treatment/Interventions: Self-care/ADL training;Therapeutic exercise;Energy conservation;DME and/or AE instruction;Therapeutic activities;Cognitive remediation/compensation;Patient/family education;Balance training    OT Goals(Current goals can be found in the care plan section) Acute Rehab OT Goals Patient Stated Goal: plan was to go home but pt talking about going to rehab OT Goal Formulation: With patient Time For Goal Achievement: 03/10/20 Potential to Achieve Goals: Good  OT Frequency: Min 2X/week   Barriers to D/C:            Co-evaluation              AM-PAC OT "6 Clicks" Daily Activity     Outcome Measure Help from another person eating meals?: None Help from another person taking care of personal grooming?: A Little Help from another person toileting, which includes using toliet, bedpan, or urinal?: A Little Help  from another person bathing (including washing, rinsing, drying)?: A Little Help from another person to put on and taking off regular upper body clothing?: A Little Help from another person to put on and taking off regular lower body clothing?: A Little 6 Click Score: 19   End of Session Equipment Utilized During Treatment: Gait belt;Rolling walker Nurse Communication: Mobility status  Activity Tolerance: Patient tolerated treatment well Patient left: with call bell/phone within reach;with chair alarm set;in chair  OT Visit Diagnosis: Unsteadiness on feet (R26.81);Other abnormalities of gait and mobility (R26.89);Muscle weakness (generalized) (M62.81);Other symptoms and signs involving cognitive function;Pain Pain - part of body:  (hips/pelvis)                Time: 5732-2025 OT Time Calculation (min): 40 min Charges:  OT General Charges $OT Visit: 1 Visit OT Evaluation $OT Eval Moderate Complexity: 1 Mod OT Treatments $Self Care/Home Management : 23-37 mins  Maurie Boettcher, OT/L   Acute OT Clinical Specialist Acute Rehabilitation Services Pager [phone] Office [phone]   [facility] 02/25/2020, 11:39 AM

## 2020-02-25 NOTE — Progress Notes (Signed)
Cross-coverage note:   Patient was seen for a fib RVR. She was noted to have HR 130-140s and complained of palpitations and jaw ache.   She appears comfortable, in no distress.    EKG with a fib RVR and ST-depressions.   Diffuse ST-depressions noted on old EKGs as well. She had echo in July 2021 with EF 90-30%, grade 1 diastolic dysfunction, and mild left atrial enlargement. She had diffuse calcific CAD on cath in 2019 that has been managed medically.   Plan to treat with IV Lopressor, check troponins, mag level, and TSH.

## 2020-02-26 DIAGNOSIS — Z7189 Other specified counseling: Secondary | ICD-10-CM

## 2020-02-26 DIAGNOSIS — Z515 Encounter for palliative care: Secondary | ICD-10-CM

## 2020-02-26 LAB — CBC
HCT: 27.2 % — ABNORMAL LOW (ref 36.0–46.0)
Hemoglobin: 9 g/dL — ABNORMAL LOW (ref 12.0–15.0)
MCH: 32.3 pg (ref 26.0–34.0)
MCHC: 33.1 g/dL (ref 30.0–36.0)
MCV: 97.5 fL (ref 80.0–100.0)
Platelets: 60 10*3/uL — ABNORMAL LOW (ref 150–400)
RBC: 2.79 MIL/uL — ABNORMAL LOW (ref 3.87–5.11)
RDW: 20.3 % — ABNORMAL HIGH (ref 11.5–15.5)
WBC: 8.7 10*3/uL (ref 4.0–10.5)
nRBC: 0 % (ref 0.0–0.2)

## 2020-02-26 LAB — COMPREHENSIVE METABOLIC PANEL
ALT: 48 U/L — ABNORMAL HIGH (ref 0–44)
AST: 69 U/L — ABNORMAL HIGH (ref 15–41)
Albumin: 2.1 g/dL — ABNORMAL LOW (ref 3.5–5.0)
Alkaline Phosphatase: 339 U/L — ABNORMAL HIGH (ref 38–126)
Anion gap: 5 (ref 5–15)
BUN: 22 mg/dL (ref 8–23)
CO2: 18 mmol/L — ABNORMAL LOW (ref 22–32)
Calcium: 10.2 mg/dL (ref 8.9–10.3)
Chloride: 116 mmol/L — ABNORMAL HIGH (ref 98–111)
Creatinine, Ser: 1.51 mg/dL — ABNORMAL HIGH (ref 0.44–1.00)
GFR, Estimated: 38 mL/min — ABNORMAL LOW (ref >60)
Glucose, Bld: 140 mg/dL — ABNORMAL HIGH (ref 70–99)
Potassium: 4.6 mmol/L (ref 3.5–5.1)
Sodium: 139 mmol/L (ref 135–145)
Total Bilirubin: 7.1 mg/dL — ABNORMAL HIGH (ref 0.3–1.2)
Total Protein: 5.1 g/dL — ABNORMAL LOW (ref 6.5–8.1)

## 2020-02-26 LAB — PROTIME-INR
INR: 2.8 — ABNORMAL HIGH (ref 0.8–1.2)
Prothrombin Time: 28.7 seconds — ABNORMAL HIGH (ref 11.4–15.2)

## 2020-02-26 LAB — AMMONIA: Ammonia: 157 umol/L — ABNORMAL HIGH (ref 9–35)

## 2020-02-26 MED ORDER — LACTULOSE 10 GM/15ML PO SOLN
30.0000 g | Freq: Three times a day (TID) | ORAL | Status: DC
  Administered 2020-02-26 – 2020-02-28 (×5): 30 g via ORAL
  Filled 2020-02-26 (×3): qty 45

## 2020-02-26 NOTE — Progress Notes (Signed)
PROGRESS NOTE                                                                             PROGRESS NOTE                                                                                                                                                                                                             Patient Demographics:    Dana Watkins, is a 65 y.o. female, DOB - 11/25/1955, KGU:[id number]  Outpatient Primary MD for the patient is Lawerance Cruel, MD    LOS - 6  Admit date - 02/20/2020    Chief Complaint  Patient presents with  . Hip Pain       Brief Narrative     HPI: Dana Watkins is a 65 y.o. female with medical history significant of liver cirrhosis secondary to alcohol abuse, chronic thrombocytopenia secondary to cirrhosis and remote ITP, CKD stage III, hypokalemia, patient with recent history of fall, pubic ramus fracture last week with recommendation for conservative management by her orthopedic, she presents to ED with complaints of hip pain, generalized weakness, her work-up was significant for decompensated liver failure, symptomatic anemia required units PRBC transfusion, as well she did develop A. fib with RVR on 2/14 .   Subjective:    [name] today ports multiple bowel movements yesterday, she went into A. fib with RVR overnight.     Assessment  & Plan :    Active Problems:   Symptomatic anemia   Hepatic cirrhosis (HCC)   Liver failure without hepatic coma (HCC)   Liver failure (HCC)   Abnormal liver function tests   Decompensated liver cirrhosis due to alcohol abuse, with hepatic encephalopathy(hyperammonemia) and coagulopathy and alcoholic hepatitis . -With evidence of coagulopathy, INR continue to trend up despite multiple doses of vitamin K, continue to monitor her INR closely. -Continue with Protonix IV twice daily -Started on prednisolone for alcoholic hepatitis -Continue with rifaximin, Aldactone  and beta-blockers. -With upper quadrant ultrasound significant for cirrhosis with  mild ascites -Ammonia level has increased today to 157, so I have changed her ammonia from as needed to scheduled. -She is with very poor prognosis given the degree of her decompensated liver failure, and her denial regarding the gravity of her liver disease and amount of alcohol consumption.  A. fib with RVR -Developed A. fib with RVR 2/14 a.m., no improvement despite multiple IV metoprolol pushes, she is started on Cardizem drip, rate has improved this morning, so we will continue to taper her Cardizem, continue with Coreg for now, her dose has been increased with overall good control. -He is not anticoagulation candidate in the setting of her thrombocytopenia, liver disease and coagulopathy. - TSH , and and magnesium within normal limits.  Acute on chronic anemia -No evidence of significant GI bleed, continue with Protonix. -So far required units PRBC transfusion, no significant evidence of GI bleed, hemoglobin remained stable.  Thrombocytopenia -Due to above, on SCD for DVT prophylaxis  CKD stage III -Bicarb is low on admission, this has resolved, will discontinue oral bicarb. -At baseline, monitor closely as on Aldactone  Hypokalemia/hypophosphatemia -repelted  Pelvic/Rami fracture -PT consulted, continue with pain control.  Alcohol abuse -No symptoms signs of active withdrawal, CIWA protocol with as needed benzos.  COVID 19 infection -She is vaccinated and boosted, asymptomatic, no hypoxia,  remdesivir x3 days.  SpO2: 97 % O2 Flow Rate (L/min): 2 L/min  Recent Labs  Lab 02/20/20 1324 02/20/20 1413 02/22/20 0202 02/23/20 0043 02/24/20 0206 02/25/20 0428 02/26/20 0316  WBC  --    < > 4.7 6.9 8.4 9.3 8.7  PLT  --    < > 45* 54* 64* 72* 60*  AST  --    < > 92* 79* 75* 74* 69*  ALT  --    < > 43 44 44 46* 48*  ALKPHOS  --    < > 238* 266* 288* 270* 339*  BILITOT  --    < > 7.5* 7.3*  7.6* 7.2* 7.1*  ALBUMIN  --    < > 2.3* 2.3* 2.3* 2.1* 2.1*  INR  --    < > 2.8* 2.7* 3.0* 2.8* 2.8*  SARSCOV2NAA POSITIVE*  --   --   --   --   --   --    < > = values in this interval not displayed.       ABG     Component Value Date/Time   HCO3 17.9 (L) 02/21/2020 0608   TCO2 16 (L) 02/20/2020 1440   ACIDBASEDEF 6.0 (H) 02/21/2020 0608   O2SAT 92.2 02/21/2020 0608      Goals of care: -Patient about her advanced liver failure, and will with alcohol consumption, she is in some denial, reports she understands that she is critical, with poor prognosis, reports she cannot stop drinking alcohol if she wants to, so palliative has been consulted, she remains full code, full scope of treatment, daughter has been updated daily, this morning patient tells me she does not want any family members to be updated .     Condition - Extremely Guarded  Family Communication  :  Daughter by phone 2/10, 2/11,2/12,2/13.2/14, today and reports she does not want me to update family, I have offered to update daughter, or parents, but she has declined, she reports she does not want me to update any family members,  Code Status :  Full  Consults  :  GI consult, palliative medicine  Disposition Plan  :  Status is: Inpatient  Remains inpatient appropriate because:IV treatments appropriate due to intensity of illness or inability to take PO   Dispo: The patient is from: Home              Anticipated d/c is to: Home              Anticipated d/c date is: 2 days              Patient currently is not medically stable to d/c.   Difficult to place patient No      DVT Prophylaxis  : SCDs, no chemical DVT prophylaxis in the setting of her thrombocytopenia and coagulopathy  Lab Results  Component Value Date   PLT 60 (L) 02/26/2020    Diet :  Diet Order            Diet Heart Room service appropriate? Yes; Fluid consistency: Thin  Diet effective now                  Inpatient  Medications  Scheduled Meds: . vitamin C  1,000 mg Oral BID  . carvedilol  6.25 mg Oral BID WC  . folic acid  1 mg Oral Daily  . lactulose  30 g Oral TID AC & HS  . magnesium oxide  400 mg Oral Daily  . multivitamin with minerals  1 tablet Oral Daily  . pantoprazole (PROTONIX) IV  40 mg Intravenous Q12H  . potassium chloride SA  20 mEq Oral BID  . prednisoLONE  40 mg Oral Daily  . rifaximin  550 mg Oral BID  . sodium bicarbonate  650 mg Oral BID  . spironolactone  25 mg Oral Daily  . thiamine  100 mg Oral Daily   Or  . thiamine  100 mg Intravenous Daily   Continuous Infusions: . diltiazem (CARDIZEM) infusion 10 mg/hr (02/26/20 0928)   PRN Meds:.lactulose, metoprolol tartrate, ondansetron (ZOFRAN) IV, oxyCODONE  Antibiotics  :    Anti-infectives (From admission, onward)   Start     Dose/Rate Route Frequency Ordered Stop   02/23/20 1000  rifaximin (XIFAXAN) tablet 550 mg        550 mg Oral 2 times daily 02/23/20 0819     02/22/20 1000  remdesivir 100 mg in sodium chloride 0.9 % 100 mL IVPB        100 mg 200 mL/hr over 30 Minutes Intravenous Daily 02/21/20 1050 02/23/20 0925   02/21/20 1145  remdesivir 200 mg in sodium chloride 0.9% 250 mL IVPB        200 mg 580 mL/hr over 30 Minutes Intravenous Once 02/21/20 1050 02/21/20 1429       Aerin Delany M.D on 02/26/2020 at 3:50 PM  To page go to [email or web address]   [facility] -  Office  [phone]      Objective:   Vitals:   02/25/20 2200 02/26/20 0142 02/26/20 0400 02/26/20 0406  BP: (!) 101/46 113/68 98/63 102/67  Pulse: 78 86 74 65  Resp: 15 13 13 11   Temp: 98.1 F (36.7 C) 98.2 F (36.8 C) 98.1 F (36.7 C) 98.1 F (36.7 C)  TempSrc: Axillary Axillary Axillary Axillary  SpO2: 97% 96% 98% 97%  Weight:      Height:        Wt Readings from Last 3 Encounters:  02/20/20 73.9 kg  02/04/20 66.8 kg  12/25/19 65.8 kg     Intake/Output Summary (Last 24 hours) at 02/26/2020 1550 Last data filed at  02/26/2020 0837 Gross per 24 hour  Intake 351.98 ml  Output --  Net 351.98 ml     Physical Exam  Awake Alert, Oriented X 3, he is mildly confused, easily distracted, no new F.N deficits, Normal affect Jaundiced Symmetrical Chest wall movement, Good air movement bilaterally, CTAB RRR,No Gallops,Rubs or new Murmurs, No Parasternal Heave +ve B.Sounds, Abd Soft, No tenderness, No rebound - guarding or rigidity. No Cyanosis, Clubbing or edema.      Data Review:    CBC Recent Labs  Lab 02/20/20 1126 02/20/20 1413 02/22/20 0202 02/23/20 0043 02/24/20 0206 02/25/20 0428 02/26/20 0316  WBC 6.5   < > 4.7 6.9 8.4 9.3 8.7  HGB 7.6*   < > 7.1* 8.1* 8.5* 8.6* 9.0*  HCT 23.5*   < > 22.9* 24.1* 26.8* 27.3* 27.2*  PLT 42*   < > 45* 54* 64* 72* 60*  MCV 100.4*   < > 97.9 96.0 97.1 98.9 97.5  MCH 32.5   < > 30.3 32.3 30.8 31.2 32.3  MCHC 32.3   < > 31.0 33.6 31.7 31.5 33.1  RDW 18.1*   < > 19.7* 19.7* 19.9* 20.4* 20.3*  LYMPHSABS 0.8  --   --   --   --   --   --   MONOABS 0.8  --   --   --   --   --   --   EOSABS 0.1  --   --   --   --   --   --   BASOSABS 0.0  --   --   --   --   --   --    < > = values in this interval not displayed.    Recent Labs  Lab 02/20/20 1127 02/20/20 1413 02/20/20 1440 02/22/20 0202 02/23/20 0043 02/24/20 0206 02/25/20 0428 02/25/20 0712 02/25/20 0713 02/26/20 0316  NA  --  138   < > 138 138 139 140  --   --  139  K  --  3.1*   < > 3.8 3.9 3.8 4.5  --   --  4.6  CL  --  113*   < > 115* 114* 114* 117*  --   --  116*  CO2  --  15*   < > 16* 16* 18* 17*  --   --  18*  GLUCOSE  --  90   < > 150* 159* 120* 102*  --   --  140*  BUN  --  18   < > 18 19 20 22   --   --  22  CREATININE  --  1.51*   < > 1.55* 1.67* 1.47* 1.53*  --   --  1.51*  CALCIUM  --  9.0   < > 9.3 9.9 9.8 10.0  --   --  10.2  AST  --  117*   < > 92* 79* 75* 74*  --   --  69*  ALT  --  52*   < > 43 44 44 46*  --   --  48*  ALKPHOS  --  237*   < > 238* 266* 288* 270*  --   --   339*  BILITOT  --  7.5*   < > 7.5* 7.3* 7.6* 7.2*  --   --  7.1*  ALBUMIN  --  2.1*   < > 2.3* 2.3* 2.3* 2.1*  --   --  2.1*  MG  --  1.9  --   --   --   --   --   --  2.1  --   INR  --   --    < > 2.8* 2.7* 3.0* 2.8*  --   --  2.8*  TSH  --   --   --   --   --   --   --  0.661  --   --   AMMONIA 45*  --   --   --  100* 57* 74*  --   --  157*   < > = values in this interval not displayed.    ------------------------------------------------------------------------------------------------------------------ No results for input(s): CHOL, HDL, LDLCALC, TRIG, CHOLHDL, LDLDIRECT in the last 72 hours.  No results found for: HGBA1C ------------------------------------------------------------------------------------------------------------------ Recent Labs    02/25/20 0712  TSH 0.661    Cardiac Enzymes No results for input(s): CKMB, TROPONINI, MYOGLOBIN in the last 168 hours.  Invalid input(s): CK ------------------------------------------------------------------------------------------------------------------    Component Value Date/Time   BNP 153.2 (H) 08/07/2019 0030    Micro Results Recent Results (from the past 240 hour(s))  SARS CORONAVIRUS 2 (TAT 6-24 HRS) Nasopharyngeal Nasopharyngeal Swab     Status: Abnormal   Collection Time: 02/20/20  1:24 PM   Specimen: Nasopharyngeal Swab  Result Value Ref Range Status   SARS Coronavirus 2 POSITIVE (A) NEGATIVE Final    Comment: (NOTE) SARS-CoV-2 target nucleic acids are DETECTED.  The SARS-CoV-2 RNA is generally detectable in upper and lower respiratory specimens during the acute phase of infection. Positive results are indicative of the presence of SARS-CoV-2 RNA. Clinical correlation with patient history and other diagnostic information is  necessary to determine patient infection status. Positive results do not rule out bacterial infection or co-infection with other viruses.  The expected result is Negative.  Fact Sheet for  Patients: SugarRoll.be  Fact Sheet for Healthcare Providers: https://www.woods-mathews.com/  This test is not yet approved or cleared by the [place] FDA and  has been authorized for detection and/or diagnosis of SARS-CoV-2 by FDA under an Emergency Use Authorization (EUA). This EUA will remain  in effect (meaning this test can be used) for the duration of the COVID-19 declaration under Section 564(b)(1) of the Act, 21 U. S.C. section 360bbb-3(b)(1), unless the authorization is terminated or revoked sooner.   Performed at [facility], [place] [address] [place], [place] [address]     Radiology Reports DG Chest 1 View  Result Date: 02/20/2020 CLINICAL DATA:  Fall EXAM: CHEST  1 VIEW COMPARISON:  10/10/2019 FINDINGS: Cardiac enlargement with vascular congestion. Negative for edema or effusion. Mild left lower lobe airspace disease most likely atelectasis. No acute skeletal abnormality. Chronic left rib fractures. IMPRESSION: Cardiac enlargement and mild vascular congestion. Negative for edema. Mild left lower lobe atelectasis. Electronically Signed   By: Franchot Gallo M.D.   On: 02/20/2020 14:46   CT PELVIS WO CONTRAST  Result Date: 02/20/2020 CLINICAL DATA:  Right hip pain after fall 2 days ago. EXAM: CT PELVIS WITHOUT CONTRAST TECHNIQUE: Multidetector CT imaging of the pelvis was performed following the standard protocol without intravenous contrast. COMPARISON:  June 25, 2018. FINDINGS: Urinary Tract:  No abnormality visualized. Bowel:  Unremarkable visualized pelvic bowel loops. Vascular/Lymphatic: Atherosclerosis of abdominal aorta is noted. No adenopathy is noted. Reproductive:  No mass or other significant abnormality Other: Mild ascites is noted in the pelvis. Minimal anasarca is noted. No hernia is noted. Musculoskeletal: Minimally displaced acute fractures are seen involving  the right inferior and superior pubic rami. The hip joints  are unremarkable. IMPRESSION: 1. Minimally displaced acute fractures are seen involving the right inferior and superior pubic rami. 2. Mild ascites is noted in the pelvis. Minimal anasarca is noted. 3. Aortic atherosclerosis. Aortic Atherosclerosis (ICD10-I70.0). Electronically Signed   By: Marijo Conception M.D.   On: 02/20/2020 12:39   DG Hip Unilat With Pelvis 2-3 Views Right  Result Date: 02/20/2020 CLINICAL DATA:  Right hip pain after fall last night. EXAM: DG HIP (WITH OR WITHOUT PELVIS) 2-3V RIGHT COMPARISON:  None. FINDINGS: There is no evidence of hip fracture or dislocation. There is no evidence of arthropathy or other focal bone abnormality. IMPRESSION: Negative. Electronically Signed   By: Marijo Conception M.D.   On: 02/20/2020 11:03   US Abdomen Limited RUQ (LIVER/GB)  Result Date: 02/21/2020 CLINICAL DATA:  65 year old female with cirrhosis.  Abnormal LFTs. EXAM: ULTRASOUND ABDOMEN LIMITED RIGHT UPPER QUADRANT COMPARISON:  None. FINDINGS: Gallbladder: No gallstone. There is diffuse thickened appearance of the gallbladder measuring up to 5 mm, likely related to underlying liver disease. Negative sonographic Murphy's sign. Common bile duct: Diameter: 6 mm.  There is intrahepatic biliary ductal dilatation. Liver: Cirrhosis. Portal vein is patent on color Doppler imaging with normal direction of blood flow towards the liver. Other: Small ascites. There is a 5 cm right renal upper pole cyst. Probable small right pleural effusion. IMPRESSION: 1. Cirrhosis and small ascites. 2. Patent main portal vein with hepatopetal flow. 3. Diffuse gallbladder wall thickening, likely related to cirrhosis and ascites. Electronically Signed   By: Anner Crete M.D.   On: 02/21/2020 23:47

## 2020-02-26 NOTE — Progress Notes (Signed)
This chaplain responded to PMT consult for notarizing the Pt. Advance Directive.  The chaplain reviewed the Pt. chart and contacted the Pt. RN-Ann for an update on the Pt. Covid isolation.    The chaplain understands the onset of the Pt. Covid isolation began on 02/20/20. Isolation will remain for 20 days. The Pt. AD document without notarization is in the Pt. shadow chart. Notarizing the Pt. AD will not be possible until 03/11/20.  This chaplain is available for F/U spiritual care as needed.

## 2020-02-26 NOTE — Plan of Care (Signed)
  Problem: Coping: Goal: Psychosocial and spiritual needs will be supported Outcome: Progressing   Problem: Respiratory: Goal: Will maintain a patent airway Outcome: Progressing   Problem: Clinical Measurements: Goal: Will remain free from infection Outcome: Progressing Goal: Respiratory complications will improve Outcome: Progressing Goal: Cardiovascular complication will be avoided Outcome: Progressing   Problem: Pain Managment: Goal: General experience of comfort will improve Outcome: Progressing   Problem: Skin Integrity: Goal: Risk for impaired skin integrity will decrease Outcome: Progressing

## 2020-02-26 NOTE — TOC Progression Note (Addendum)
Transition of Care [facility]) - Progression Note    Patient Details  Name: Dana Watkins MRN: [id number] Date of Birth: 10-31-1955  Transition of Care [facility]) CM/SW Contact  Carles Collet, RN Phone Number: 02/26/2020, 4:00 PM  Clinical Narrative:    Patient progressed with PT. New recs for [facility] services. Called room, and LVM on cell requesting call back. Could not reach patient to discuss plan.   Received call back from patient.  She would like HH PT, not OT nor palliative services.  She has no preference for [facility] provider. Referral made to [facility], they are reviewing. She would like 3/1. Order placed, it will be delivered to unit tomorrow.     Expected Discharge Plan: [place] Barriers to Discharge: Continued Medical Work up  Expected Discharge Plan and Services Expected Discharge Plan: [place] In-house Referral: NA Discharge Planning Services: CM Consult Post Acute Care Choice: [place] Living arrangements for the past 2 months: Apartment                 DME Arranged: N/A DME Agency: NA       HH Arranged: NA HH Agency: NA         Social Determinants of Health (SDOH) Interventions    Readmission Risk Interventions Readmission Risk Prevention Plan 08/14/2019 12/11/2018  Transportation Screening Complete Complete  PCP or Specialist Appt within 3-5 Days Complete Complete  HRI or [place] Complete Complete  Social Work Consult for [place] Planning/Counseling Complete Complete  Palliative Care Screening Not Applicable Not Applicable  Medication Review Press photographer) Complete Referral to Pharmacy  Some recent data might be hidden

## 2020-02-26 NOTE — Consult Note (Signed)
Palliative Medicine Inpatient Consult Note  Reason for consult:  Goals of Care  HPI:  Per intake H&P --> Dana Watkins a 65 y.o.femalewith medical history significant ofliver cirrhosis secondary to alcohol abuse, chronic thrombocytopenia secondary to cirrhosis and remote ITP, CKD stageIII, hypokalemia, patient with recent history of fall, pubic ramus fracture last week with recommendation for conservative management by her orthopedic, she presents to ED with complaints of hip pain, generalized weakness, her work-up was significant for decompensated liver failure, symptomatic anemia required units PRBC transfusion, as well she did develop A. fib with RVR on 2/14 .  Palliative care was asked to get involved to discuss goals of care in the setting of liver cirrhosis with poor overall understanding of disease severity.   Clinical Assessment/Goals of Care:  *Please note that this is a verbal dictation therefore any spelling or grammatical errors are due to the "[place] One" system interpretation.  I have reviewed medical records including EPIC notes, labs and imaging, received report from bedside RN, assessed the patient who was lying in bed in no distress.    I met with [name] to further discuss diagnosis prognosis, GOC, EOL wishes, disposition and options.   I introduced Palliative Medicine as specialized medical care for people living with serious illness. It focuses on providing relief from the symptoms and stress of a serious illness. The goal is to improve quality of life for both the patient and the family.  Dana Watkins shares with me that she was born and raised in [place], [place]. She has three children. Two daughter who lives locally and one son who lives in [place]. She is not married and endorses that she has been divorced for a number of years now. 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 She enjoys being social and endorses having a great network of friends that she can rely upon. 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   Prior to hospitalization [name] had been living in an apartment in "[place]". She shares that her apartment is on the first floor. She has been prior able to complete all bADL's and iADL's on her own. She does utilize a walker in her home for mobility purposes though shares "I don't like it."   I asked Dana Watkins to share with me her understanding of her liver disease. She tells me that the GI doctor is alarmist to some degree in his description of her disease process. I told her that her liver disease is quite advance and in all honestly the only way to help it moving forward is to maintain an appropriate diet, cessate from drinking, remain mobile, and take medication as prescribed. She endorses understanding of this and share with me that she does not feel that she drinks much at all to have such severe disease.   She shares with me that other medical issues include her present COVID-19 infection and her irregular heart rhythm. We reviewed that she does not know how she contracted COVID-19 but that her family is also afflicted by this. She goes on to tell me that she has a pelvic fracture though she fortunately will not require surgery for this.   A detailed discussion was had today regarding advanced directives - Mairany has never completed these and was interested in doing so while I was at bedside. We completed the HCPOA and Living will  components. I will ask my colleague, Dorian Pod to help with the notarization process.    Concepts specific to code status, artifical feeding and hydration, continued IV antibiotics and rehospitalization was had. We completed a most for as below:  Cardiopulmonary Resuscitation: Attempt Resuscitation (CPR)  Medical Interventions: Full Scope of Treatment: Use intubation, advanced airway  interventions, mechanical ventilation, cardioversion as indicated, medical treatment, IV fluids, etc, also provide comfort measures. Transfer to the hospital if indicated  Antibiotics: Antibiotics if indicated  IV Fluids: IV fluids if indicated  Feeding Tube: Feeding tube for a defined trial period   I shared with Emila that I strongly recommend ongoing outpatient Palliative support which she was in agreement with. A consult will be placed through TOC.   Discussed the importance of continued conversation with family and their  medical providers regarding overall plan of care and treatment options, ensuring decisions are within the context of the patients values and GOCs.  Provided "Hard Choices for [employer]" booklet.   Decision Maker: Patient can present make decisions for herself. If unable to do so she would designate her daughter, [name] to make decisions for her.  SUMMARY OF RECOMMENDATIONS   Full Code / Full Scope of Care  MOST Completed, paper copy placed onto the chart electric copy can be found in [place] completing advance directives  TOC - OP Palliative Support  Code Status/Advance Care Planning: FULL CODE   Palliative Prophylaxis:   Mobility  Additional Recommendations (Limitations, Scope, Preferences):  Continue current scope of care   Psycho-social/Spiritual:   Desire for further Chaplaincy support: Yes - Christian  Additional Recommendations: Education on Cirrhosis    Prognosis: MELD - 29 --> 19.6% mortality in 3 months  Discharge Planning: Discharge to home with [facility] with OP Palliative Care  Vitals:   02/26/20 0400 02/26/20 0406  BP: 98/63 102/67  Pulse: 74 65  Resp: 13 11  Temp: 98.1 F (36.7 C) 98.1 F (36.7 C)  SpO2: 98% 97%    Intake/Output Summary (Last 24 hours) at 02/26/2020 6812 Last data filed at 02/26/2020 0600 Gross per 24 hour  Intake 471.98 ml  Output --  Net 471.98 ml   Last Weight  Most recent  update: 02/20/2020 10:29 PM   Weight  73.9 kg (162 lb 14.7 oz)           Gen:  Caucasian F in NAD HEENT: Dry mucous membranes CV: Regular rate and rhythm  PULM: clear to auscultation bilaterally  ABD: soft/nontender  EXT: No edema  Neuro: Alert and oriented x4  PPS: 50%   This conversation/these recommendations were discussed with patient primary care team, Dr. Waldron Labs  Time In: 0630 Time Out: 0750 Total Time: 80 Greater than 50%  of this time was spent counseling and coordinating care related to the above assessment and plan.  [place] Team Team Cell Phone: [phone] Please utilize secure chat with additional questions, if there is no response within 30 minutes please call the above phone number  Palliative Medicine Team providers are available by phone from 7am to 7pm daily and can be reached through the team cell phone.  Should this patient require assistance outside of these hours, please call the patient's attending physician.

## 2020-02-26 NOTE — Progress Notes (Signed)
Physical Therapy Treatment Patient Details Name: Dana Watkins MRN: [id number] DOB: September 26, 1955 Today's Date: 02/26/2020    History of Present Illness Pt is a 65 y.o. female with recent fall sustaining R pubic rami fx, now admitted 02/20/20 after additional fall; imaging negative for acute injury, did show prior R pubic rami fx. Pt also with liver failure. PMH includes liver cirrhosis, ETOH abuse, chronic thrombocytopenia, CKD 3.   PT Comments    Pt progressing well with mobility. Able to ambulate throughout room with RW and perform standing/seated ADL tasks with improving stability; supervision for safety due to fall risk. Pt demonstrates improved insight into current deficits, talking to daughters about adjusting things with home set-up up to make mobilizing easier. Pt still with decreased attention and safety awareness. Will continue to follow acutely to address established goals.   Follow Up Recommendations  Home health PT;Supervision for mobility/OOB     Equipment Recommendations  3in1 (PT)    Recommendations for Other Services       Precautions / Restrictions Precautions Precautions: Fall    Mobility  Bed Mobility Overal bed mobility: Independent Bed Mobility: Supine to Sit           General bed mobility comments: Received scooted down towards end of bed with legs up in air and bent over foot plate; indep with bed flat to sit EOB    Transfers Overall transfer level: Needs assistance Equipment used: Rolling walker (2 wheeled);None Transfers: Sit to/from Stand Sit to Stand: Supervision         General transfer comment: Able to stand without and without RW from bed, recliner and BSC at sink; supervision for safety  Ambulation/Gait Ambulation/Gait assistance: Supervision Gait Distance (Feet): 40 Feet Assistive device: Rolling walker (2 wheeled) Gait Pattern/deviations: Step-through pattern;Decreased stride length;Narrow base of support Gait velocity:  Decreased   General Gait Details: Slow, steady gait with RW and supervision for safety; pt declined hallway ambulation due to wanting to stay close to bathroom due to bowel frequency/urgency (attributes to medicine). Able to stand and take BUEs off RW for ADL tasks at sink   Marine scientist [place] (Stroke Patients Only)       Balance Overall balance assessment: Needs assistance Sitting-balance support: No upper extremity supported;Feet supported Sitting balance-Leahy Scale: Good Sitting balance - Comments: Able to perform lower body washup while seated at sink without UE support   Standing balance support: Bilateral upper extremity supported;No upper extremity supported Standing balance-Leahy Scale: Fair Standing balance comment: IMproving stability without UE support, performing upper body washup standing at sink without UE support                            Cognition Arousal/Alertness: Awake/alert Behavior During Therapy: WFL for tasks assessed/performed Overall Cognitive Status: No family/caregiver present to determine baseline cognitive functioning Area of Impairment: Attention;Memory;Safety/judgement;Awareness;Problem solving                   Current Attention Level: Selective Memory: Decreased short-term memory Following Commands: Follows one step commands consistently Safety/Judgement: Decreased awareness of safety Awareness: Emergent Problem Solving: Requires verbal cues General Comments: Demonstrates improving insight/awareness into deficits - reports, "My bed is really tall at home, so my daughter and I are trying to figure out a better option since I might have a hard time getting into it." Continues to talk  to herself throughout session and can be easily distracted      Exercises General Exercises - Lower Extremity Long Arc Quad: AROM;Both;Supine Straight Leg Raises: AROM;Both;Supine    General  Comments General comments (skin integrity, edema, etc.): Declined hallway ambulation, therefore session focused on safety and balance performing ADL tasks in room; pt performing full washup sitting and standing at sink without assist, supervision for safety due to fall risk      Pertinent Vitals/Pain Pain Assessment: No/denies pain Pain Intervention(s): Monitored during session    Home Living                      Prior Function            PT Goals (current goals can now be found in the care plan section) Progress towards PT goals: Progressing toward goals    Frequency    Min 3X/week      PT Plan Current plan remains appropriate    Co-evaluation              AM-PAC PT "6 Clicks" Mobility   Outcome Measure  Help needed turning from your back to your side while in a flat bed without using bedrails?: None Help needed moving from lying on your back to sitting on the side of a flat bed without using bedrails?: None Help needed moving to and from a bed to a chair (including a wheelchair)?: A Little Help needed standing up from a chair using your arms (e.g., wheelchair or bedside chair)?: A Little Help needed to walk in hospital room?: A Little Help needed climbing 3-5 steps with a railing? : A Little 6 Click Score: 20    End of Session   Activity Tolerance: Patient tolerated treatment well Patient left: in chair;with call bell/phone within reach;with chair alarm set Nurse Communication: Mobility status PT Visit Diagnosis: Unsteadiness on feet (R26.81);Muscle weakness (generalized) (M62.81);History of falling (Z91.81);Pain;Difficulty in walking, not elsewhere classified (R26.2) Pain - Right/Left: Right Pain - part of body: Hip     Time: 2595-6387 PT Time Calculation (min) (ACUTE ONLY): 26 min  Charges:  $Therapeutic Activity: 23-37 mins                     Mabeline Caras, PT, DPT Acute Rehabilitation Services  Pager [phone] Office  [place] 02/26/2020, 1:49 PM

## 2020-02-27 LAB — COMPREHENSIVE METABOLIC PANEL
ALT: 53 U/L — ABNORMAL HIGH (ref 0–44)
AST: 73 U/L — ABNORMAL HIGH (ref 15–41)
Albumin: 2.1 g/dL — ABNORMAL LOW (ref 3.5–5.0)
Alkaline Phosphatase: 322 U/L — ABNORMAL HIGH (ref 38–126)
Anion gap: 6 (ref 5–15)
BUN: 23 mg/dL (ref 8–23)
CO2: 19 mmol/L — ABNORMAL LOW (ref 22–32)
Calcium: 10.2 mg/dL (ref 8.9–10.3)
Chloride: 116 mmol/L — ABNORMAL HIGH (ref 98–111)
Creatinine, Ser: 1.46 mg/dL — ABNORMAL HIGH (ref 0.44–1.00)
GFR, Estimated: 40 mL/min — ABNORMAL LOW (ref >60)
Glucose, Bld: 118 mg/dL — ABNORMAL HIGH (ref 70–99)
Potassium: 4.4 mmol/L (ref 3.5–5.1)
Sodium: 141 mmol/L (ref 135–145)
Total Bilirubin: 7.6 mg/dL — ABNORMAL HIGH (ref 0.3–1.2)
Total Protein: 4.9 g/dL — ABNORMAL LOW (ref 6.5–8.1)

## 2020-02-27 LAB — CBC
HCT: 26.3 % — ABNORMAL LOW (ref 36.0–46.0)
Hemoglobin: 8.6 g/dL — ABNORMAL LOW (ref 12.0–15.0)
MCH: 32 pg (ref 26.0–34.0)
MCHC: 32.7 g/dL (ref 30.0–36.0)
MCV: 97.8 fL (ref 80.0–100.0)
Platelets: 70 10*3/uL — ABNORMAL LOW (ref 150–400)
RBC: 2.69 MIL/uL — ABNORMAL LOW (ref 3.87–5.11)
RDW: 20.7 % — ABNORMAL HIGH (ref 11.5–15.5)
WBC: 7.9 10*3/uL (ref 4.0–10.5)
nRBC: 0 % (ref 0.0–0.2)

## 2020-02-27 LAB — PROTIME-INR
INR: 2.8 — ABNORMAL HIGH (ref 0.8–1.2)
Prothrombin Time: 28.4 seconds — ABNORMAL HIGH (ref 11.4–15.2)

## 2020-02-27 LAB — AMMONIA: Ammonia: 71 umol/L — ABNORMAL HIGH (ref 9–35)

## 2020-02-27 MED ORDER — SPIRONOLACTONE 25 MG PO TABS
50.0000 mg | ORAL_TABLET | Freq: Every day | ORAL | Status: DC
  Administered 2020-02-28: 50 mg via ORAL
  Filled 2020-02-27: qty 2

## 2020-02-27 MED ORDER — FUROSEMIDE 20 MG PO TABS
20.0000 mg | ORAL_TABLET | Freq: Once | ORAL | Status: AC
  Administered 2020-02-27: 20 mg via ORAL
  Filled 2020-02-27: qty 1

## 2020-02-27 NOTE — Progress Notes (Signed)
Occupational Therapy Treatment Patient Details Name: Dana Watkins MRN: [id number] DOB: [birth year] Today's Date: 02/27/2020    History of present illness Pt is a 65 y.o. female with recent fall sustaining R pubic rami fx, now admitted 02/20/20 after additional fall; imaging negative for acute injury, did show prior R pubic rami fx. Pt also with liver failure. PMH includes liver cirrhosis, ETOH abuse, chronic thrombocytopenia, CKD 3.   OT comments  Pt is able to perform ADLs at supervision - min guard assist, but continues with cognitive deficits including deficits with awareness, safety, and problem solving.  Recommend she have direct supervision with medication management, financial management, cooking, and recommend no driving.  Will continue to follow.   Follow Up Recommendations  Home health OT;Supervision - Intermittent (direct supervision with medication management, financial management, and cooking.  Recommend no driving)    Equipment Recommendations  3 in 1 bedside commode    Recommendations for Other Services      Precautions / Restrictions Precautions Precautions: Fall Restrictions Other Position/Activity Restrictions: pt reports WBAT since pubic rami fx       Mobility Bed Mobility               General bed mobility comments: sitting in chair  Transfers Overall transfer level: Needs assistance Equipment used: Rolling walker (2 wheeled);None Transfers: Sit to/from [employer] to Stand: Min guard;Supervision Stand pivot transfers: Min guard;Supervision       General transfer comment: supervision with RW and min guard assist with no device    Balance Overall balance assessment: Needs assistance Sitting-balance support: No upper extremity supported;Feet supported Sitting balance-Leahy Scale: Good     Standing balance support: No upper extremity supported;During functional activity Standing balance-Leahy Scale: Fair                              ADL either performed or assessed with clinical judgement   ADL Overall ADL's : Needs assistance/impaired     Grooming: Wash/dry hands;Wash/dry face;Oral care;Brushing hair;Supervision/safety;Standing   Upper Body Bathing: Set up;Sitting;Supervision/ safety;Standing   Lower Body Bathing: Min guard;Sit to/from stand   Upper Body Dressing : Set up;Sitting   Lower Body Dressing: Min guard;Sit to/from stand   Toilet Transfer: Min guard;Ambulation;Comfort height toilet;Grab bars                   Vision       Perception     Praxis      Cognition Arousal/Alertness: Awake/alert Behavior During Therapy: WFL for tasks assessed/performed Overall Cognitive Status: No family/caregiver present to determine baseline cognitive functioning Area of Impairment: Attention;Memory;Safety/judgement;Awareness;Problem solving                   Current Attention Level: Selective Memory: Decreased short-term memory Following Commands: Follows one step commands consistently;Follows multi-step commands inconsistently Safety/Judgement: Decreased awareness of deficits;Decreased awareness of safety Awareness: Emergent Problem Solving: Requires verbal cues General Comments: Pt is verbose and frequently self distracts.  She demonstrates impaired safety awareness, and decreased insight into deficits.  She requires cues for recall and min cues for problem solving.        Exercises     Shoulder Instructions       General Comments Pt is unable to state who will be staying with her at discharge.  She says her friends and family will be in and out all of the time  Discussed with her the recommendation  for direct supervision with medication management, financial management, and cooking.  She immediately began talkine about food products she eats, how good they are and how she shares them with her neighbors.  Pt was redirected back to the conversation and the  recommendations for supervision were reiterated.  She insists someone will assist her.   Reinforced safety risks associated with cooking, medication and financial management    Pertinent Vitals/ Pain       Pain Assessment: No/denies pain  Home Living                                          Prior Functioning/Environment              Frequency  Min 2X/week        Progress Toward Goals  OT Goals(current goals can now be found in the care plan section)  Progress towards OT goals: Progressing toward goals     Plan Discharge plan remains appropriate    Co-evaluation                 AM-PAC OT "6 Clicks" Daily Activity     Outcome Measure   Help from another person eating meals?: None Help from another person taking care of personal grooming?: A Little Help from another person toileting, which includes using toliet, bedpan, or urinal?: A Little Help from another person bathing (including washing, rinsing, drying)?: A Little Help from another person to put on and taking off regular upper body clothing?: A Little Help from another person to put on and taking off regular lower body clothing?: A Little 6 Click Score: 19    End of Session    OT Visit Diagnosis: Unsteadiness on feet (R26.81);Other abnormalities of gait and mobility (R26.89);Muscle weakness (generalized) (M62.81);Other symptoms and signs involving cognitive function;Pain   Activity Tolerance Patient tolerated treatment well   Patient Left in chair;with call bell/phone within reach;with chair alarm set   Nurse Communication Mobility status        Time: 6546-5035 OT Time Calculation (min): 27 min  Charges: OT General Charges $OT Visit: 1 Visit OT Treatments $Self Care/Home Management : 23-37 mins  Nilsa Nutting OTR/L Acute Rehabilitation Services Pager [phone] Office [phone]    Lucille Passy M 02/27/2020, 3:10 PM

## 2020-02-27 NOTE — TOC Transition Note (Signed)
Transition of Care [facility]) - CM/SW Discharge Note   Patient Details  Name: ADELYNN GIPE MRN: [id number] Date of Birth: February 14, 1955  Transition of Care [facility]) CM/SW Contact:  Carles Collet, RN Phone Number: 02/27/2020, 2:14 PM   Clinical Narrative:    [facility] could not provide [place] services.  Referral made to [facility] who can provide for [facility] PT. PT able to do VS, med reconciliation, and request additional [facility] services after initial assessment. Patient declined other disciplines, as well as palliative services.  3/1 ordered yesterday    Final next level of care: [place] Barriers to Discharge: Continued Medical Work up   Patient Goals and CMS Choice Patient states their goals for this hospitalization and ongoing recovery are:: to go home CMS Medicare.gov Compare Post Acute Care list provided to:: Patient Choice offered to / list presented to : Patient  Discharge Placement                       Discharge Plan and Services In-house Referral: NA Discharge Planning Services: CM Consult Post Acute Care Choice: Home Health,Durable Medical Equipment          DME Arranged: 3-N-1 DME Agency: [facility] Date DME Agency Contacted: 02/26/20 Time DME Agency Contacted: [phone] Representative spoke with at DME Agency: Learned: PT [place]: [facility] Date [place]: 02/27/20 Time [place]: 1414 Representative spoke with at [place]: [place]  Social Determinants of Health ([place]) Interventions     Readmission Risk Interventions Readmission Risk Prevention Plan 08/14/2019 12/11/2018  Transportation Screening Complete Complete  PCP or Specialist Appt within 3-5 Days Complete Complete  HRI or [place] Complete Complete  Social Work Consult for [place] Planning/Counseling Complete Complete  Palliative Care Screening Not Applicable Not Applicable  Medication Review Press photographer)  Complete Referral to Pharmacy  Some recent data might be hidden

## 2020-02-27 NOTE — Progress Notes (Signed)
No major trend in the patient's liver chemistries.  Ultrasound shows evidence of cirrhosis, so this is presumably not just acute alcoholic hepatitis.  Blood ammonia level today is once again improved.  However, it should be kept in mind that the ammonia level is not a highly accurate indicator of the level of hepatic encephalopathy.  The patient had not received lactulose for a couple of days because of diarrhea, but her stools have slowed down and she did receive a dose today. I reminded the nurse that doses can be held if the patient has had a bowel movement in the preceding 8 hours.  On exam, the patient has no asterixis and is very alert and chipper. She does serial threes accurately, although somewhat slowly and with some difficulty.  Her guarded prognosis was reviewed with her. She seemed somewhat surprised by this estimate of a 20% mortality in the next 3 months, since she "feels so well."  We will continue to follow the patient at a distance, every several days. Please call us in the meantime if earlier input from [place] is desired.  Cleotis Nipper, M.D. Pager [phone] If no answer or after 5 PM call [phone]

## 2020-02-27 NOTE — Care Management Important Message (Signed)
Important Message  Patient Details  Name: Dana Watkins MRN: [id number] Date of Birth: 12-21-1955   Medicare Important Message Given:  Yes - Important Message mailed due to current National Emergency   Verbal consent obtained due to current National Emergency  Relationship to patient: Self Contact Name: Fayth Call Date: 02/27/20  Time: 1527 Phone: [phone] Outcome: No Answer/Busy Important Message mailed to: Patient address on file    Delorse Lek 02/27/2020, 3:28 PM

## 2020-02-27 NOTE — Progress Notes (Signed)
PROGRESS NOTE                                                                             PROGRESS NOTE                                                                                                                                                                                                             Patient Demographics:    Dana Watkins, is a 65 y.o. female, DOB - Nov 30, 1955, YFR:[id number]  Outpatient Primary MD for the patient is Lawerance Cruel, MD    LOS - 7  Admit date - 02/20/2020    Chief Complaint  Patient presents with  . Hip Pain       Brief Narrative   Dana Watkins is a 65 y.o. female with history of liver cirrhosis, thrombocytopenia, CKD stage IIIa-recently diagnosed with pubic rami fracture following a mechanical fall-presented with generalized weakness-she was found to have decompensated liver failure, symptomatic anemia requiring PRBC transfusion-and further hospital course was complicated by development of A. fib with RVR on 2/14.  Significant studies: 2/9>> CT pelvis: Minimally displaced acute fracture involving the right inferior/superior pubic rami 2/9>> x-ray right hip: No fracture 2/9>> chest x-ray: No pneumonia 2/10>> RUQ ultrasound: Cirrhosis/small ascites  COVID-19 specific treatment: Remdesivir: 2/10>> 2/12  Antimicrobial therapy:  None  Consults: GI, palliative care  CODE STATUS:  Full code  DVT prophylaxis: On SCDs due to severe thrombocytopenia   Subjective:   Denies any chest pain or shortness of breath   Assessment  & Plan :   Decompensated liver cirrhosis with volume overload, hepatic encephalopathy, coagulopathy and acute alcoholic hepatitis: Remains volume overloaded-increase Aldactone to 50 mg daily, add Lasix 20 mg daily-follow volume status/BP-and increase accordingly.  Her mentation is much improved-remains on lactulose and rifaximin.  Remains on empiric steroids for presumed  alcoholic hepatitis.  A. fib with RVR: Rate better controlled-continue Coreg-attempt to titrate off Cardizem gtt.  Not a candidate for anticoagulation given severe thrombocytopenia.  EtOH abuse: No signs of  withdrawal-follow closely  CKD stage IIIa: Creatinine close to baseline follow periodically  Anemia: No blood loss-likely secondary to CKD/liver cirrhosis.  Follow.  Thrombocytopenia: Secondary to hypersplenism-follow  Pubic rami fracture: Following a mechanical fall-continue supportive care  COVID-19 infection: Asymptomatic-completed 3 days of Remdesivir  Condition - Extremely Guarded  Family Communication  : Prefers to update family herself-does not want me to call family-I have asked her to let me know if family has questions for me.  Code Status :  Full  Disposition Plan  :    Status is: Inpatient  Remains inpatient appropriate because:IV treatments appropriate due to intensity of illness or inability to take PO   Dispo: The patient is from: Home              Anticipated d/c is to: Home              Anticipated d/c date is: 2 days              Patient currently is not medically stable to d/c.   Difficult to place patient No    Lab Results  Component Value Date   PLT 70 (L) 02/27/2020    Diet :  Diet Order            Diet Heart Room service appropriate? Yes; Fluid consistency: Thin  Diet effective now                  Inpatient Medications  Scheduled Meds: . vitamin C  1,000 mg Oral BID  . carvedilol  6.25 mg Oral BID WC  . folic acid  1 mg Oral Daily  . lactulose  30 g Oral TID AC & HS  . magnesium oxide  400 mg Oral Daily  . multivitamin with minerals  1 tablet Oral Daily  . pantoprazole (PROTONIX) IV  40 mg Intravenous Q12H  . potassium chloride SA  20 mEq Oral BID  . prednisoLONE  40 mg Oral Daily  . rifaximin  550 mg Oral BID  . sodium bicarbonate  650 mg Oral BID  . spironolactone  25 mg Oral Daily  . thiamine  100 mg Oral Daily   Or  .  thiamine  100 mg Intravenous Daily   Continuous Infusions: . diltiazem (CARDIZEM) infusion 10 mg/hr (02/26/20 0928)   PRN Meds:.lactulose, metoprolol tartrate, ondansetron (ZOFRAN) IV, oxyCODONE  Antibiotics  :    Anti-infectives (From admission, onward)   Start     Dose/Rate Route Frequency Ordered Stop   02/23/20 1000  rifaximin (XIFAXAN) tablet 550 mg        550 mg Oral 2 times daily 02/23/20 0819     02/22/20 1000  remdesivir 100 mg in sodium chloride 0.9 % 100 mL IVPB        100 mg 200 mL/hr over 30 Minutes Intravenous Daily 02/21/20 1050 02/23/20 0925   02/21/20 1145  remdesivir 200 mg in sodium chloride 0.9% 250 mL IVPB        200 mg 580 mL/hr over 30 Minutes Intravenous Once 02/21/20 1050 02/21/20 1429       Shanker Ghimire M.D on 02/27/2020 at 2:06 PM  To page go to [email or web address]   [facility] -  Office  [phone]      Objective:   Vitals:   02/26/20 0400 02/26/20 0406 02/26/20 2104 02/27/20 0503  BP: 98/63 102/67 109/67 109/81  Pulse: 74 65 70 85  Resp: 13 11 20 13   Temp: 98.1 F (36.7 C)  98.1 F (36.7 C) 98.4 F (36.9 C) 98 F (36.7 C)  TempSrc: Axillary Axillary Oral Axillary  SpO2: 98% 97%  98%  Weight:      Height:        Wt Readings from Last 3 Encounters:  02/20/20 73.9 kg  02/04/20 66.8 kg  12/25/19 65.8 kg     Intake/Output Summary (Last 24 hours) at 02/27/2020 1406 Last data filed at 02/27/2020 0600 Gross per 24 hour  Intake 56 ml  Output -  Net 56 ml     Physical Exam Gen Exam:Alert awake-not in any distress HEENT:atraumatic, normocephalic Chest: B/L clear to auscultation anteriorly CVS:S1S2 regular Abdomen:soft non tender, non distended Extremities:++ edema Neurology: Non focal Skin: no rash   Data Review:    CBC Recent Labs  Lab 02/23/20 0043 02/24/20 0206 02/25/20 0428 02/26/20 0316 02/27/20 0241  WBC 6.9 8.4 9.3 8.7 7.9  HGB 8.1* 8.5* 8.6* 9.0* 8.6*  HCT 24.1* 26.8* 27.3* 27.2* 26.3*  PLT 54* 64*  72* 60* 70*  MCV 96.0 97.1 98.9 97.5 97.8  MCH 32.3 30.8 31.2 32.3 32.0  MCHC 33.6 31.7 31.5 33.1 32.7  RDW 19.7* 19.9* 20.4* 20.3* 20.7*    Recent Labs  Lab 02/20/20 1413 02/20/20 1440 02/23/20 0043 02/24/20 0206 02/25/20 0428 02/25/20 0712 02/25/20 0713 02/26/20 0316 02/27/20 0241  NA 138   < > 138 139 140  --   --  139 141  K 3.1*   < > 3.9 3.8 4.5  --   --  4.6 4.4  CL 113*   < > 114* 114* 117*  --   --  116* 116*  CO2 15*   < > 16* 18* 17*  --   --  18* 19*  GLUCOSE 90   < > 159* 120* 102*  --   --  140* 118*  BUN 18   < > 19 20 22   --   --  22 23  CREATININE 1.51*   < > 1.67* 1.47* 1.53*  --   --  1.51* 1.46*  CALCIUM 9.0   < > 9.9 9.8 10.0  --   --  10.2 10.2  AST 117*   < > 79* 75* 74*  --   --  69* 73*  ALT 52*   < > 44 44 46*  --   --  48* 53*  ALKPHOS 237*   < > 266* 288* 270*  --   --  339* 322*  BILITOT 7.5*   < > 7.3* 7.6* 7.2*  --   --  7.1* 7.6*  ALBUMIN 2.1*   < > 2.3* 2.3* 2.1*  --   --  2.1* 2.1*  MG 1.9  --   --   --   --   --  2.1  --   --   INR  --    < > 2.7* 3.0* 2.8*  --   --  2.8* 2.8*  TSH  --   --   --   --   --  0.661  --   --   --   AMMONIA  --   --  100* 57* 74*  --   --  157* 71*   < > = values in this interval not displayed.    ------------------------------------------------------------------------------------------------------------------ No results for input(s): CHOL, HDL, LDLCALC, TRIG, CHOLHDL, LDLDIRECT in the last 72 hours.  No results found for: HGBA1C ------------------------------------------------------------------------------------------------------------------ Recent Labs    02/25/20 0712  TSH  0.661    Cardiac Enzymes No results for input(s): CKMB, TROPONINI, MYOGLOBIN in the last 168 hours.  Invalid input(s): CK ------------------------------------------------------------------------------------------------------------------    Component Value Date/Time   BNP 153.2 (H) 08/07/2019 0030    Micro Results Recent  Results (from the past 240 hour(s))  SARS CORONAVIRUS 2 (TAT 6-24 HRS) Nasopharyngeal Nasopharyngeal Swab     Status: Abnormal   Collection Time: 02/20/20  1:24 PM   Specimen: Nasopharyngeal Swab  Result Value Ref Range Status   SARS Coronavirus 2 POSITIVE (A) NEGATIVE Final    Comment: (NOTE) SARS-CoV-2 target nucleic acids are DETECTED.  The SARS-CoV-2 RNA is generally detectable in upper and lower respiratory specimens during the acute phase of infection. Positive results are indicative of the presence of SARS-CoV-2 RNA. Clinical correlation with patient history and other diagnostic information is  necessary to determine patient infection status. Positive results do not rule out bacterial infection or co-infection with other viruses.  The expected result is Negative.  Fact Sheet for Patients: SugarRoll.be  Fact Sheet for Healthcare Providers: https://www.woods-mathews.com/  This test is not yet approved or cleared by the [place] FDA and  has been authorized for detection and/or diagnosis of SARS-CoV-2 by FDA under an Emergency Use Authorization (EUA). This EUA will remain  in effect (meaning this test can be used) for the duration of the COVID-19 declaration under Section 564(b)(1) of the Act, 21 U. S.C. section 360bbb-3(b)(1), unless the authorization is terminated or revoked sooner.   Performed at [facility], [place] [address], [place], [place] [address]     Radiology Reports DG Chest 1 View  Result Date: 02/20/2020 CLINICAL DATA:  Fall EXAM: CHEST  1 VIEW COMPARISON:  10/10/2019 FINDINGS: Cardiac enlargement with vascular congestion. Negative for edema or effusion. Mild left lower lobe airspace disease most likely atelectasis. No acute skeletal abnormality. Chronic left rib fractures. IMPRESSION: Cardiac enlargement and mild vascular congestion. Negative for edema. Mild left lower lobe atelectasis. Electronically Signed    By: Franchot Gallo M.D.   On: 02/20/2020 14:46   CT PELVIS WO CONTRAST  Result Date: 02/20/2020 CLINICAL DATA:  Right hip pain after fall 2 days ago. EXAM: CT PELVIS WITHOUT CONTRAST TECHNIQUE: Multidetector CT imaging of the pelvis was performed following the standard protocol without intravenous contrast. COMPARISON:  June 25, 2018. FINDINGS: Urinary Tract:  No abnormality visualized. Bowel:  Unremarkable visualized pelvic bowel loops. Vascular/Lymphatic: Atherosclerosis of abdominal aorta is noted. No adenopathy is noted. Reproductive:  No mass or other significant abnormality Other: Mild ascites is noted in the pelvis. Minimal anasarca is noted. No hernia is noted. Musculoskeletal: Minimally displaced acute fractures are seen involving the right inferior and superior pubic rami. The hip joints are unremarkable. IMPRESSION: 1. Minimally displaced acute fractures are seen involving the right inferior and superior pubic rami. 2. Mild ascites is noted in the pelvis. Minimal anasarca is noted. 3. Aortic atherosclerosis. Aortic Atherosclerosis (ICD10-I70.0). Electronically Signed   By: Marijo Conception M.D.   On: 02/20/2020 12:39   DG Hip Unilat With Pelvis 2-3 Views Right  Result Date: 02/20/2020 CLINICAL DATA:  Right hip pain after fall last night. EXAM: DG HIP (WITH OR WITHOUT PELVIS) 2-3V RIGHT COMPARISON:  None. FINDINGS: There is no evidence of hip fracture or dislocation. There is no evidence of arthropathy or other focal bone abnormality. IMPRESSION: Negative. Electronically Signed   By: Marijo Conception M.D.   On: 02/20/2020 11:03   US Abdomen Limited RUQ (LIVER/GB)  Result Date: 02/21/2020 CLINICAL DATA:  65 year old female with cirrhosis.  Abnormal LFTs. EXAM: ULTRASOUND ABDOMEN LIMITED RIGHT UPPER QUADRANT COMPARISON:  None. FINDINGS: Gallbladder: No gallstone. There is diffuse thickened appearance of the gallbladder measuring up to 5 mm, likely related to underlying liver disease. Negative  sonographic Murphy's sign. Common bile duct: Diameter: 6 mm.  There is intrahepatic biliary ductal dilatation. Liver: Cirrhosis. Portal vein is patent on color Doppler imaging with normal direction of blood flow towards the liver. Other: Small ascites. There is a 5 cm right renal upper pole cyst. Probable small right pleural effusion. IMPRESSION: 1. Cirrhosis and small ascites. 2. Patent main portal vein with hepatopetal flow. 3. Diffuse gallbladder wall thickening, likely related to cirrhosis and ascites. Electronically Signed   By: Anner Crete M.D.   On: 02/21/2020 23:47

## 2020-02-28 ENCOUNTER — Telehealth: Payer: Self-pay

## 2020-02-28 DIAGNOSIS — K7460 Unspecified cirrhosis of liver: Secondary | ICD-10-CM | POA: Diagnosis not present

## 2020-02-28 DIAGNOSIS — U071 COVID-19: Secondary | ICD-10-CM | POA: Diagnosis not present

## 2020-02-28 DIAGNOSIS — Z7189 Other specified counseling: Secondary | ICD-10-CM

## 2020-02-28 DIAGNOSIS — K7290 Hepatic failure, unspecified without coma: Secondary | ICD-10-CM | POA: Diagnosis not present

## 2020-02-28 DIAGNOSIS — R7989 Other specified abnormal findings of blood chemistry: Secondary | ICD-10-CM | POA: Diagnosis not present

## 2020-02-28 DIAGNOSIS — Z515 Encounter for palliative care: Secondary | ICD-10-CM

## 2020-02-28 DIAGNOSIS — R69 Illness, unspecified: Secondary | ICD-10-CM | POA: Diagnosis not present

## 2020-02-28 DIAGNOSIS — S329XXA Fracture of unspecified parts of lumbosacral spine and pelvis, initial encounter for closed fracture: Secondary | ICD-10-CM

## 2020-02-28 LAB — COMPREHENSIVE METABOLIC PANEL
ALT: 58 U/L — ABNORMAL HIGH (ref 0–44)
AST: 74 U/L — ABNORMAL HIGH (ref 15–41)
Albumin: 2.2 g/dL — ABNORMAL LOW (ref 3.5–5.0)
Alkaline Phosphatase: 322 U/L — ABNORMAL HIGH (ref 38–126)
Anion gap: 7 (ref 5–15)
BUN: 25 mg/dL — ABNORMAL HIGH (ref 8–23)
CO2: 17 mmol/L — ABNORMAL LOW (ref 22–32)
Calcium: 10.5 mg/dL — ABNORMAL HIGH (ref 8.9–10.3)
Chloride: 115 mmol/L — ABNORMAL HIGH (ref 98–111)
Creatinine, Ser: 1.64 mg/dL — ABNORMAL HIGH (ref 0.44–1.00)
GFR, Estimated: 35 mL/min — ABNORMAL LOW (ref >60)
Glucose, Bld: 153 mg/dL — ABNORMAL HIGH (ref 70–99)
Potassium: 4.2 mmol/L (ref 3.5–5.1)
Sodium: 139 mmol/L (ref 135–145)
Total Bilirubin: 7.7 mg/dL — ABNORMAL HIGH (ref 0.3–1.2)
Total Protein: 5 g/dL — ABNORMAL LOW (ref 6.5–8.1)

## 2020-02-28 LAB — AMMONIA: Ammonia: 160 umol/L — ABNORMAL HIGH (ref 9–35)

## 2020-02-28 LAB — CBC
HCT: 27.2 % — ABNORMAL LOW (ref 36.0–46.0)
Hemoglobin: 9.2 g/dL — ABNORMAL LOW (ref 12.0–15.0)
MCH: 33 pg (ref 26.0–34.0)
MCHC: 33.8 g/dL (ref 30.0–36.0)
MCV: 97.5 fL (ref 80.0–100.0)
Platelets: 77 10*3/uL — ABNORMAL LOW (ref 150–400)
RBC: 2.79 MIL/uL — ABNORMAL LOW (ref 3.87–5.11)
RDW: 21.2 % — ABNORMAL HIGH (ref 11.5–15.5)
WBC: 10.2 10*3/uL (ref 4.0–10.5)
nRBC: 0 % (ref 0.0–0.2)

## 2020-02-28 LAB — PROTIME-INR
INR: 2.6 — ABNORMAL HIGH (ref 0.8–1.2)
Prothrombin Time: 26.7 seconds — ABNORMAL HIGH (ref 11.4–15.2)

## 2020-02-28 MED ORDER — CARVEDILOL 12.5 MG PO TABS
12.5000 mg | ORAL_TABLET | Freq: Two times a day (BID) | ORAL | Status: DC
  Administered 2020-02-28: 12.5 mg via ORAL

## 2020-02-28 MED ORDER — CARVEDILOL 12.5 MG PO TABS: 12.5000 mg | ORAL_TABLET | Freq: Two times a day (BID) | ORAL | Status: DC

## 2020-02-28 NOTE — Telephone Encounter (Signed)
Palliative Medicine RN Note: Rec'd a call from Ms Hilgert, who reports she left AMA. She had questions for our NP Sharyn Lull, who has already left for the day.  Upon further discussion, her questions all relate to hospice and what they can/can't/do/don't do. She does not have a preference for providers, so based on her location in [place], I placed a call to [facility] I asked Dr Hilma Favors to place a hospice order.  Spoke w Chrislyn with [facility]. They were already aware of consult and will f/u with [facility].  Marjie Skiff Odell Fasching, RN, BSN, [facility] Palliative Medicine Team 02/28/2020 1:22 PM Office [phone]

## 2020-02-28 NOTE — Progress Notes (Addendum)
Paged Dr. Gwen Pounds that pt is requesting to have telemetry and continuous pulse ox monitoring d/c. No return call or order placed. Explained to pt that we will ask morning doctor if telemetry can be d/c. Pt is agreement to wait till morning doctor arrives. Will continue to monitor pt. Racheal Patches, RN

## 2020-02-28 NOTE — Progress Notes (Addendum)
PROGRESS NOTE                                                                             PROGRESS NOTE                                                                                                                                                                                                             Patient Demographics:    Dana Watkins, is a 65 y.o. female, DOB - September 08, 1955, FOY:[id number]  Outpatient Primary MD for the patient is Lawerance Cruel, MD    LOS - 8  Admit date - 02/20/2020    Chief Complaint  Patient presents with  . Hip Pain       Brief Narrative   Dana Watkins is a 65 y.o. female with history of liver cirrhosis, thrombocytopenia, CKD stage IIIa-recently diagnosed with pubic rami fracture following a mechanical fall-presented with generalized weakness-she was found to have decompensated liver failure, symptomatic anemia requiring PRBC transfusion-and further hospital course was complicated by development of A. fib with RVR on 2/14.  Significant studies: 2/9>> CT pelvis: Minimally displaced acute fracture involving the right inferior/superior pubic rami 2/9>> x-ray right hip: No fracture 2/9>> chest x-ray: No pneumonia 2/10>> RUQ ultrasound: Cirrhosis/small ascites  COVID-19 specific treatment: Remdesivir: 2/10>> 2/12  Antimicrobial therapy:  None  Consults: GI, palliative care  CODE STATUS:  Full code  DVT prophylaxis: On SCDs due to severe thrombocytopenia   Subjective:   Seen earlier this morning-she had just got out of bed and had used bedside commode-she was in A. fib with RVR with a heart rate in the 140s.  She had made up her mind that she wanted to leave the hospital today-" Whats the point-I am going to die in 5 days anyways".  Saying she wants to go home and spend time with family.  I told her that she needs to remain hospitalized-I did not think that her mortality was eminent (ie in 5 days).   Explained that her heart rate was  not optimal for discharge-although her volume status/mentation had improved significantly.  I specifically counseled her regarding the importance of remaining hospitalized-explained the life-threatening and life disabling risks of leaving the hospital Dana Watkins.  In spite of my explaining-she indicated to me that she was going to leave the hospital today.   Assessment  & Plan :   Decompensated liver cirrhosis with volume overload, hepatic encephalopathy, coagulopathy and acute alcoholic hepatitis: Overall improved-she is awake/alert-she is not encephalopathic-her volume status has significantly improved after adjustment of her diuretic regimen.  Plans are to continue with Aldactone/Lasix/lactulose/rifaximin and empiric steroids.  Will follow closely and adjust medications accordingly.  A. fib with RVR: Significant RVR with activity-seems to somewhat get better with rest.  No longer on Cardizem infusion-increase Coreg to 12.5 mg twice daily.  Obviously not a candidate for anticoagulation given severe thrombocytopenia/liver cirrhosis.    EtOH abuse: No signs of withdrawal-follow closely  CKD stage IIIa: Creatinine close to baseline follow periodically  Anemia: No blood loss-likely secondary to CKD/liver cirrhosis.  Follow.  Thrombocytopenia: Secondary to hypersplenism-follow  Pubic rami fracture: Following a mechanical fall-continue supportive care  COVID-19 infection: Asymptomatic-completed 3 days of Remdesivir  Condition - Extremely Guarded  Family Communication  : Prefers to update family herself-does not want me to call family-I have asked her to let me know if family has questions for me.  Code Status :  Full  Disposition Plan  :    Status is: Inpatient  Remains inpatient appropriate because:IV treatments appropriate due to intensity of illness or inability to take PO   Dispo: The patient is from: Home              Anticipated d/c  is to: Home              Anticipated d/c date is: 2 days              Patient currently is not medically stable to d/c.   Difficult to place patient No    Lab Results  Component Value Date   PLT 77 (L) 02/28/2020    Diet :  Diet Order            Diet Heart Room service appropriate? Yes; Fluid consistency: Thin  Diet effective now                  Inpatient Medications  Scheduled Meds: . vitamin C  1,000 mg Oral BID  . carvedilol  12.5 mg Oral BID WC  . folic acid  1 mg Oral Daily  . lactulose  30 g Oral TID AC & HS  . magnesium oxide  400 mg Oral Daily  . multivitamin with minerals  1 tablet Oral Daily  . pantoprazole (PROTONIX) IV  40 mg Intravenous Q12H  . potassium chloride SA  20 mEq Oral BID  . prednisoLONE  40 mg Oral Daily  . rifaximin  550 mg Oral BID  . sodium bicarbonate  650 mg Oral BID  . spironolactone  50 mg Oral Daily  . thiamine  100 mg Oral Daily   Or  . thiamine  100 mg Intravenous Daily   Continuous Infusions: . diltiazem (CARDIZEM) infusion Stopped (02/27/20 1945)   PRN Meds:.lactulose, metoprolol tartrate, ondansetron (ZOFRAN) IV, oxyCODONE  Antibiotics  :    Anti-infectives (From admission, onward)   Start     Dose/Rate Route Frequency Ordered Stop   02/23/20 1000  rifaximin (XIFAXAN) tablet 550 mg  550 mg Oral 2 times daily 02/23/20 0819     02/22/20 1000  remdesivir 100 mg in sodium chloride 0.9 % 100 mL IVPB        100 mg 200 mL/hr over 30 Minutes Intravenous Daily 02/21/20 1050 02/23/20 0925   02/21/20 1145  remdesivir 200 mg in sodium chloride 0.9% 250 mL IVPB        200 mg 580 mL/hr over 30 Minutes Intravenous Once 02/21/20 1050 02/21/20 1429       Trista Ciocca M.D on 02/28/2020 at 2:05 PM  To page go to [email or web address]   [facility] -  Office  [phone]      Objective:   Vitals:   02/27/20 0503 02/27/20 1456 02/27/20 2321 02/28/20 0555  BP: 109/81 110/78 134/78 (!) 147/85  Pulse: 85 90 (!) 104 (!)  105  Resp: 13 17 20 18   Temp: 98 F (36.7 C) 98 F (36.7 C) 98.9 F (37.2 C) 98.7 F (37.1 C)  TempSrc: Axillary Oral Oral Oral  SpO2: 98% 98% 97% 95%  Weight:      Height:        Wt Readings from Last 3 Encounters:  02/20/20 73.9 kg  02/04/20 66.8 kg  12/25/19 65.8 kg     Intake/Output Summary (Last 24 hours) at 02/28/2020 1405 Last data filed at 02/28/2020 0603 Gross per 24 hour  Intake 240 ml  Output --  Net 240 ml     Physical Exam Gen Exam:Alert awake-not in any distress HEENT:atraumatic, normocephalic Chest: B/L clear to auscultation anteriorly CVS:S1S2 irregular Abdomen:soft non tender, non distended Extremities:+ edema Neurology: Non focal Skin: no rash   Data Review:    CBC Recent Labs  Lab 02/24/20 0206 02/25/20 0428 02/26/20 0316 02/27/20 0241 02/28/20 0116  WBC 8.4 9.3 8.7 7.9 10.2  HGB 8.5* 8.6* 9.0* 8.6* 9.2*  HCT 26.8* 27.3* 27.2* 26.3* 27.2*  PLT 64* 72* 60* 70* 77*  MCV 97.1 98.9 97.5 97.8 97.5  MCH 30.8 31.2 32.3 32.0 33.0  MCHC 31.7 31.5 33.1 32.7 33.8  RDW 19.9* 20.4* 20.3* 20.7* 21.2*    Recent Labs  Lab 02/24/20 0206 02/25/20 0428 02/25/20 0712 02/25/20 0713 02/26/20 0316 02/27/20 0241 02/28/20 0116  NA 139 140  --   --  139 141 139  K 3.8 4.5  --   --  4.6 4.4 4.2  CL 114* 117*  --   --  116* 116* 115*  CO2 18* 17*  --   --  18* 19* 17*  GLUCOSE 120* 102*  --   --  140* 118* 153*  BUN 20 22  --   --  22 23 25*  CREATININE 1.47* 1.53*  --   --  1.51* 1.46* 1.64*  CALCIUM 9.8 10.0  --   --  10.2 10.2 10.5*  AST 75* 74*  --   --  69* 73* 74*  ALT 44 46*  --   --  48* 53* 58*  ALKPHOS 288* 270*  --   --  339* 322* 322*  BILITOT 7.6* 7.2*  --   --  7.1* 7.6* 7.7*  ALBUMIN 2.3* 2.1*  --   --  2.1* 2.1* 2.2*  MG  --   --   --  2.1  --   --   --   INR 3.0* 2.8*  --   --  2.8* 2.8* 2.6*  TSH  --   --  0.661  --   --   --   --  AMMONIA 57* 74*  --   --  157* 71* 160*     ------------------------------------------------------------------------------------------------------------------ No results for input(s): CHOL, HDL, LDLCALC, TRIG, CHOLHDL, LDLDIRECT in the last 72 hours.  No results found for: HGBA1C ------------------------------------------------------------------------------------------------------------------ No results for input(s): TSH, T4TOTAL, T3FREE, THYROIDAB in the last 72 hours.  Invalid input(s): FREET3  Cardiac Enzymes No results for input(s): CKMB, TROPONINI, MYOGLOBIN in the last 168 hours.  Invalid input(s): CK ------------------------------------------------------------------------------------------------------------------    Component Value Date/Time   BNP 153.2 (H) 08/07/2019 0030    Micro Results Recent Results (from the past 240 hour(s))  SARS CORONAVIRUS 2 (TAT 6-24 HRS) Nasopharyngeal Nasopharyngeal Swab     Status: Abnormal   Collection Time: 02/20/20  1:24 PM   Specimen: Nasopharyngeal Swab  Result Value Ref Range Status   SARS Coronavirus 2 POSITIVE (A) NEGATIVE Final    Comment: (NOTE) SARS-CoV-2 target nucleic acids are DETECTED.  The SARS-CoV-2 RNA is generally detectable in upper and lower respiratory specimens during the acute phase of infection. Positive results are indicative of the presence of SARS-CoV-2 RNA. Clinical correlation with patient history and other diagnostic information is  necessary to determine patient infection status. Positive results do not rule out bacterial infection or co-infection with other viruses.  The expected result is Negative.  Fact Sheet for Patients: SugarRoll.be  Fact Sheet for Healthcare Providers: https://www.woods-mathews.com/  This test is not yet approved or cleared by the [place] FDA and  has been authorized for detection and/or diagnosis of SARS-CoV-2 by FDA under an Emergency Use Authorization (EUA). This EUA  will remain  in effect (meaning this test can be used) for the duration of the COVID-19 declaration under Section 564(b)(1) of the Act, 21 U. S.C. section 360bbb-3(b)(1), unless the authorization is terminated or revoked sooner.   Performed at [facility], [place] [address], [place], [place] [address]     Radiology Reports DG Chest 1 View  Result Date: 02/20/2020 CLINICAL DATA:  Fall EXAM: CHEST  1 VIEW COMPARISON:  10/10/2019 FINDINGS: Cardiac enlargement with vascular congestion. Negative for edema or effusion. Mild left lower lobe airspace disease most likely atelectasis. No acute skeletal abnormality. Chronic left rib fractures. IMPRESSION: Cardiac enlargement and mild vascular congestion. Negative for edema. Mild left lower lobe atelectasis. Electronically Signed   By: Franchot Gallo M.D.   On: 02/20/2020 14:46   CT PELVIS WO CONTRAST  Result Date: 02/20/2020 CLINICAL DATA:  Right hip pain after fall 2 days ago. EXAM: CT PELVIS WITHOUT CONTRAST TECHNIQUE: Multidetector CT imaging of the pelvis was performed following the standard protocol without intravenous contrast. COMPARISON:  June 25, 2018. FINDINGS: Urinary Tract:  No abnormality visualized. Bowel:  Unremarkable visualized pelvic bowel loops. Vascular/Lymphatic: Atherosclerosis of abdominal aorta is noted. No adenopathy is noted. Reproductive:  No mass or other significant abnormality Other: Mild ascites is noted in the pelvis. Minimal anasarca is noted. No hernia is noted. Musculoskeletal: Minimally displaced acute fractures are seen involving the right inferior and superior pubic rami. The hip joints are unremarkable. IMPRESSION: 1. Minimally displaced acute fractures are seen involving the right inferior and superior pubic rami. 2. Mild ascites is noted in the pelvis. Minimal anasarca is noted. 3. Aortic atherosclerosis. Aortic Atherosclerosis (ICD10-I70.0). Electronically Signed   By: Marijo Conception M.D.   On: 02/20/2020 12:39    DG Hip Unilat With Pelvis 2-3 Views Right  Result Date: 02/20/2020 CLINICAL DATA:  Right hip pain after fall last night. EXAM: DG HIP (WITH OR  WITHOUT PELVIS) 2-3V RIGHT COMPARISON:  None. FINDINGS: There is no evidence of hip fracture or dislocation. There is no evidence of arthropathy or other focal bone abnormality. IMPRESSION: Negative. Electronically Signed   By: Marijo Conception M.D.   On: 02/20/2020 11:03   US Abdomen Limited RUQ (LIVER/GB)  Result Date: 02/21/2020 CLINICAL DATA:  65 year old female with cirrhosis.  Abnormal LFTs. EXAM: ULTRASOUND ABDOMEN LIMITED RIGHT UPPER QUADRANT COMPARISON:  None. FINDINGS: Gallbladder: No gallstone. There is diffuse thickened appearance of the gallbladder measuring up to 5 mm, likely related to underlying liver disease. Negative sonographic Murphy's sign. Common bile duct: Diameter: 6 mm.  There is intrahepatic biliary ductal dilatation. Liver: Cirrhosis. Portal vein is patent on color Doppler imaging with normal direction of blood flow towards the liver. Other: Small ascites. There is a 5 cm right renal upper pole cyst. Probable small right pleural effusion. IMPRESSION: 1. Cirrhosis and small ascites. 2. Patent main portal vein with hepatopetal flow. 3. Diffuse gallbladder wall thickening, likely related to cirrhosis and ascites. Electronically Signed   By: Anner Crete M.D.   On: 02/21/2020 23:47

## 2020-02-28 NOTE — Progress Notes (Addendum)
                                                         AMA  Patient at this time expresses desire to leave the Hospital immidiately, patient has been warned that this is not Medically advisable at this time, and can result in Medical complications like Death and Disability, patient understands and accepts the risks involved and assumes full responsibilty of this decision.   Oren Binet M.D on 02/28/2020 at 2:10 PM  [facility]

## 2020-02-28 NOTE — Discharge Summary (Signed)
PATIENT DETAILS Name: Dana Watkins Age: 65 y.o. Sex: female Date of Birth: [birth year] MRN: [id number]. Admitting Physician: Lequita Halt, MD [facility]:QION, Dwyane Luo, MD  Admit Date: 02/20/2020 Discharge date: 02/28/2020  Note:patient left AMA  Recommendations for Outpatient Follow-up:  1. Optimize diuretic regimen 2. Consider involvement with hospice/palliative care 3. Encourage complete abstinence from alcohol use  PRIMARY DISCHARGE DIAGNOSIS:  Active Problems:   Symptomatic anemia   Hepatic cirrhosis (HCC)   Liver failure without hepatic coma (HCC)   Liver failure (HCC)   Abnormal liver function tests   Palliative care by specialist   Goals of care, counseling/discussion   Advance care planning      PAST MEDICAL HISTORY: Past Medical History:  Diagnosis Date  . Acute hypokalemia 11/26/2014  . Arthritis 05/15/2015  . Benign essential hypertension 05/15/2015  . C. difficile colitis   . CAD (coronary artery disease)    a. cath 04/2017: ""Diffuse, calcific CAD particularly in the distal RCA and proximal to mid LAD.  LAD disease is nonobstructive.  RCA disease is more severe but does not appear significant.  Given her lack of symptoms, would pursue medical therapy."  . CKD (chronic kidney disease), stage III ([place])   . Hypokalemia   . Migraine headache 05/15/2015  . Mild mitral regurgitation   . Pancytopenia ([place])   . Sepsis ([place]) 01/2017  . Thrombocytopenia ([place])    a. chronic thrombocytopenia (ITP - remotely saw hematology).  . Vitamin D deficiency     ALLERGIES:   Allergies  Allergen Reactions  . Sulfa Antibiotics Nausea And Vomiting and Rash    Brief Narrative   Dana Watkins a 65 y.o.femalewith history of liver cirrhosis, thrombocytopenia, CKD stage IIIa-recently diagnosed with pubic rami fracture following a mechanical fall-presented with generalized weakness-she was found to have decompensated liver failure, symptomatic anemia requiring PRBC  transfusion-and further hospital course was complicated by development of A. fib with RVR on 2/14.  Significant studies: 2/9>> CT pelvis: Minimally displaced acute fracture involving the right inferior/superior pubic rami 2/9>> x-ray right hip: No fracture 2/9>> chest x-ray: No pneumonia 2/10>> RUQ ultrasound: Cirrhosis/small ascites  COVID-19 specific treatment: Remdesivir: 2/10>> 2/12  Antimicrobial therapy:  None  Consults: GI, palliative care  CODE STATUS:  Full code  Hospital course by problem list Decompensated liver cirrhosis with volume overload, hepatic encephalopathy, coagulopathy and acute alcoholic hepatitis: Overall improved-she is awake/alert-she is not encephalopathic-her volume status has significantly improved after adjustment of her diuretic regimen.  Plans were to continue with Aldactone/Lasix/lactulose/rifaximin and empiric steroids and follow closely-however in spite of being counseled against leaving AGAINST MEDICAL ADVICE-patient signed out AMA on 2/17.  A. fib with RVR:  Continues to have significant RVR with activity-heart rate was not optimal for discharge.  Not a candidate for anticoagulation given severe thrombocytopenia and liver cirrhosis.  Unfortunately in spite of being counseled against leaving AMA-she signed out [place] on 2/17.    EtOH abuse: No signs of withdrawal-plans were to follow closely  CKD stage IIIa: Creatinine close to baseline follow periodically  Anemia: No blood loss-likely secondary to CKD/liver cirrhosis.  Plans are to follow closely  Thrombocytopenia: Secondary to hypersplenism-plans were to follow closely  Pubic rami fracture: Following a mechanical fall-managed with supportive care  COVID-19 infection: Asymptomatic-completed 3 days of Remdesivir    PERTINENT RADIOLOGIC STUDIES: DG Chest 1 View  Result Date: 02/20/2020 CLINICAL DATA:  Fall EXAM: CHEST  1 VIEW COMPARISON:  10/10/2019 FINDINGS:  Cardiac enlargement with  vascular congestion. Negative for edema or effusion. Mild left lower lobe airspace disease most likely atelectasis. No acute skeletal abnormality. Chronic left rib fractures. IMPRESSION: Cardiac enlargement and mild vascular congestion. Negative for edema. Mild left lower lobe atelectasis. Electronically Signed   By: Franchot Gallo M.D.   On: 02/20/2020 14:46   CT PELVIS WO CONTRAST  Result Date: 02/20/2020 CLINICAL DATA:  Right hip pain after fall 2 days ago. EXAM: CT PELVIS WITHOUT CONTRAST TECHNIQUE: Multidetector CT imaging of the pelvis was performed following the standard protocol without intravenous contrast. COMPARISON:  June 25, 2018. FINDINGS: Urinary Tract:  No abnormality visualized. Bowel:  Unremarkable visualized pelvic bowel loops. Vascular/Lymphatic: Atherosclerosis of abdominal aorta is noted. No adenopathy is noted. Reproductive:  No mass or other significant abnormality Other: Mild ascites is noted in the pelvis. Minimal anasarca is noted. No hernia is noted. Musculoskeletal: Minimally displaced acute fractures are seen involving the right inferior and superior pubic rami. The hip joints are unremarkable. IMPRESSION: 1. Minimally displaced acute fractures are seen involving the right inferior and superior pubic rami. 2. Mild ascites is noted in the pelvis. Minimal anasarca is noted. 3. Aortic atherosclerosis. Aortic Atherosclerosis (ICD10-I70.0). Electronically Signed   By: Marijo Conception M.D.   On: 02/20/2020 12:39   DG Hip Unilat With Pelvis 2-3 Views Right  Result Date: 02/20/2020 CLINICAL DATA:  Right hip pain after fall last night. EXAM: DG HIP (WITH OR WITHOUT PELVIS) 2-3V RIGHT COMPARISON:  None. FINDINGS: There is no evidence of hip fracture or dislocation. There is no evidence of arthropathy or other focal bone abnormality. IMPRESSION: Negative. Electronically Signed   By: Marijo Conception M.D.   On: 02/20/2020 11:03   US Abdomen Limited RUQ  (LIVER/GB)  Result Date: 02/21/2020 CLINICAL DATA:  65 year old female with cirrhosis.  Abnormal LFTs. EXAM: ULTRASOUND ABDOMEN LIMITED RIGHT UPPER QUADRANT COMPARISON:  None. FINDINGS: Gallbladder: No gallstone. There is diffuse thickened appearance of the gallbladder measuring up to 5 mm, likely related to underlying liver disease. Negative sonographic Murphy's sign. Common bile duct: Diameter: 6 mm.  There is intrahepatic biliary ductal dilatation. Liver: Cirrhosis. Portal vein is patent on color Doppler imaging with normal direction of blood flow towards the liver. Other: Small ascites. There is a 5 cm right renal upper pole cyst. Probable small right pleural effusion. IMPRESSION: 1. Cirrhosis and small ascites. 2. Patent main portal vein with hepatopetal flow. 3. Diffuse gallbladder wall thickening, likely related to cirrhosis and ascites. Electronically Signed   By: Anner Crete M.D.   On: 02/21/2020 23:47     PERTINENT LAB RESULTS: CBC: Recent Labs    02/27/20 0241 02/28/20 0116  WBC 7.9 10.2  HGB 8.6* 9.2*  HCT 26.3* 27.2*  PLT 70* 77*   CMET CMP     Component Value Date/Time   NA 139 02/28/2020 0116   NA 139 02/15/2019 1012   NA 141 11/25/2014 1532   K 4.2 02/28/2020 0116   K 2.7 (LL) 11/25/2014 1532   CL 115 (H) 02/28/2020 0116   CO2 17 (L) 02/28/2020 0116   CO2 22 11/25/2014 1532   GLUCOSE 153 (H) 02/28/2020 0116   GLUCOSE 113 11/25/2014 1532   BUN 25 (H) 02/28/2020 0116   BUN 11 02/15/2019 1012   BUN 16.4 11/25/2014 1532   CREATININE 1.64 (H) 02/28/2020 0116   CREATININE 1.53 (H) 02/04/2020 1408   CREATININE 1.4 (H) 11/25/2014 1532   CALCIUM 10.5 (H) 02/28/2020 0116   CALCIUM 10.4 11/25/2014  1532   PROT 5.0 (L) 02/28/2020 0116   PROT 6.0 02/15/2019 1012   PROT 6.8 11/25/2014 1532   ALBUMIN 2.2 (L) 02/28/2020 0116   ALBUMIN 3.1 (L) 02/15/2019 1012   ALBUMIN 3.4 (L) 11/25/2014 1532   AST 74 (H) 02/28/2020 0116   AST 81 (H) 02/04/2020 1408   AST 39 (H)  11/25/2014 1532   ALT 58 (H) 02/28/2020 0116   ALT 43 02/04/2020 1408   ALT 21 11/25/2014 1532   ALKPHOS 322 (H) 02/28/2020 0116   ALKPHOS 193 (H) 11/25/2014 1532   BILITOT 7.7 (H) 02/28/2020 0116   BILITOT 7.1 (HH) 02/04/2020 1408   BILITOT 0.76 11/25/2014 1532   GFRNONAA 35 (L) 02/28/2020 0116   GFRNONAA 38 (L) 02/04/2020 1408   GFRAA 43 (L) 09/25/2019 1159    GFR Estimated Creatinine Clearance: 35 mL/min (A) (by C-G formula based on SCr of 1.64 mg/dL (H)). No results for input(s): LIPASE, AMYLASE in the last 72 hours. No results for input(s): CKTOTAL, CKMB, CKMBINDEX, TROPONINI in the last 72 hours. Invalid input(s): POCBNP No results for input(s): DDIMER in the last 72 hours. No results for input(s): HGBA1C in the last 72 hours. No results for input(s): CHOL, HDL, LDLCALC, TRIG, CHOLHDL, LDLDIRECT in the last 72 hours. No results for input(s): TSH, T4TOTAL, T3FREE, THYROIDAB in the last 72 hours.  Invalid input(s): FREET3 No results for input(s): VITAMINB12, FOLATE, FERRITIN, TIBC, IRON, RETICCTPCT in the last 72 hours. Coags: Recent Labs    02/27/20 0241 02/28/20 0116  INR 2.8* 2.6*   Microbiology: Recent Results (from the past 240 hour(s))  SARS CORONAVIRUS 2 (TAT 6-24 HRS) Nasopharyngeal Nasopharyngeal Swab     Status: Abnormal   Collection Time: 02/20/20  1:24 PM   Specimen: Nasopharyngeal Swab  Result Value Ref Range Status   SARS Coronavirus 2 POSITIVE (A) NEGATIVE Final    Comment: (NOTE) SARS-CoV-2 target nucleic acids are DETECTED.  The SARS-CoV-2 RNA is generally detectable in upper and lower respiratory specimens during the acute phase of infection. Positive results are indicative of the presence of SARS-CoV-2 RNA. Clinical correlation with patient history and other diagnostic information is  necessary to determine patient infection status. Positive results do not rule out bacterial infection or co-infection with other viruses.  The expected result is  Negative.  Fact Sheet for Patients: SugarRoll.be  Fact Sheet for Healthcare Providers: https://www.woods-mathews.com/  This test is not yet approved or cleared by the [place] FDA and  has been authorized for detection and/or diagnosis of SARS-CoV-2 by FDA under an Emergency Use Authorization (EUA). This EUA will remain  in effect (meaning this test can be used) for the duration of the COVID-19 declaration under Section 564(b)(1) of the Act, 21 U. S.C. section 360bbb-3(b)(1), unless the authorization is terminated or revoked sooner.   Performed at [facility], [place] [address], [place], [place] [address]       TODAY-DAY OF DISCHARGE:  Subjective:   Abel Ra today has signed out against medical advice. He was warned about the life threatening and life disabling effects by MD    Objective:   Blood pressure (!) 147/85, pulse (!) 105, temperature 98.7 F (37.1 C), temperature source Oral, resp. rate 18, height 5' 8"  (1.727 m), weight 73.9 kg, SpO2 95 %.   DISCHARGE CONDITION: Not stable for discharge-left AMA  DISPOSITION: AMA   Follow-up Information    [facility] Follow up.   Specialty: Home Health Services Why: for home health  services. they will call you in 1-2 days to set up your first home appointment Contact information: [place] [address] [phone]              Follow with your PCP in 1 week   Total Time spent on discharge equals 25  minutes.  SignedOren Binet 02/28/2020 2:11 PM

## 2020-02-28 NOTE — Progress Notes (Addendum)
Palliative Medicine Inpatient Follow Up Note   Reason for consult:  Goals of Care  HPI:  Per intake H&P --> Dana Watkins a 65 y.o.femalewith medical history significant ofliver cirrhosis secondary to alcohol abuse, chronic thrombocytopenia secondary to cirrhosis and remote ITP, CKD stageIII, hypokalemia,patient with recent history of fall, pubic ramus fracture last week with recommendation for conservative management by her orthopedic, she presents to ED with complaints of hip pain, generalized weakness, her work-up was significant for decompensated liver failure, symptomatic anemia required units PRBC transfusion, as well she did develop A. fib with RVR on 2/14.  Palliative care was asked to get involved to discuss goals of care in the setting of liver cirrhosis with poor overall understanding of disease severity.   Today's Discussion (02/28/2020):  *Please note that this is a verbal dictation therefore any spelling or grammatical errors are due to the "[place] [facility]" system interpretation.  Chart reviewed. HR remains elevated this morning though has been weaned to RA. Does not appear to be in any distress.   Dana Watkins remains to feel she can improve her overall health state. Having a hard time accepting her presently poor prognosis.   Of note due to isolation and only intermittent notary support Dana Watkins's living will cannot be notarized in house. A more formalized advance care planning note will be complete documenting her wishes.   Please be aware that she did initial next to the following on her living will  "1. When My Directives Apply My directions about prolonging my life shall apply IF my attending physician determines that I lack capacity to make or communicate health care decisions and:  I have an incurable or irreversible condition that will result in my death within a  relatively short period of time.  I become unconscious and my health care providers  determine that, to a high degree of  medical certainty, I will never regain my consciousness.  I suffer from advanced dementia or any other condition which results in the substantial  loss of my cognitive ability and my health care providers determine that, to a high  degree of medical certainty, this loss is not reversible."  Patient had also on her HCPOA documentation wished for her daughter, Dana Watkins to be her surrogate decision maker.  Questions and concerns addressed  ________________________________________________________________ Addendum:  Received a call from patients bedside RN, Sharyn Lull. She shares that Dana Watkins is now at a point where she is willing to accept her present diagnosis. We discussed scheduling a family meeting in an effort to aid in patient and family understanding. At this time we are trying to coordinate with GI in terms of the best meeting times.  _________________________________________________________________ Addendum #2  I was asked by patients bedside RN, Sharyn Lull to come up to speak with Dana Watkins as she was threatening to leave AMA. I went to bedside. Dana Watkins shares that she "knows she is dying" and would like to go home and optimize the time she has left with her family. I asked her why she now feels like she only has days to live and she shares with me that Dr. Paulita Fujita had vocalized this information to her. I reviewed her diagnosis and that she has more than days to live. Reiterated the importance of not drinking, maintaining a proper diet, and taking medications as prescribed. She shares that she understands this but she also understands she won't be here much longer. She requests to leave AMA. I told her that her heart is in  an arrhythmia which is not controlled ad would be fatal if she chooses to leave. She states that she accepts this risk. I again reviewed her code status which she now shares will be DNAR/DNI in the setting of her severe liver disease. I  asked her if we could set up hospice given all that we had dicussed.  I described hospice as a service for patients for have a life expectancy of < 6 months. It preserves dignity and quality at the end phases of life. The focus changes from curative to symptom relief. She states that she would like to be on home hospice. She declines my offer to have a family meeting in house. She shares she would just like to leave as soon as possible. Patients bedside RN, Sharyn Lull got AMA paperwork signed.   I contacted case management to arrange home hospice.   Time in: 0945 Time Out: 1020 Additional Time: 35 minutes   Objective Assessment: Vital Signs Vitals:   02/27/20 2321 02/28/20 0555  BP: 134/78 (!) 147/85  Pulse: (!) 104 (!) 105  Resp: 20 18  Temp: 98.9 F (37.2 C) 98.7 F (37.1 C)  SpO2: 97% 95%    Intake/Output Summary (Last 24 hours) at 02/28/2020 0349 Last data filed at 02/28/2020 1791 Gross per 24 hour  Intake 720 ml  Output --  Net 720 ml   Last Weight  Most recent update: 02/20/2020 10:29 PM   Weight  73.9 kg (162 lb 14.7 oz)           Gen:  Caucasian F in NAD HEENT: Dry mucous membranes CV: Regular rate and rhythm  PULM: clear to auscultation bilaterally  ABD: soft/nontender  EXT: No edema  Neuro: Alert and oriented x4  SUMMARY OF RECOMMENDATIONS   Full Code / Full Scope of Care  Plan for family meeting, time not yet confirmed though anticipated date for tomorrow  Incremental PMT support  Time Spent: 35 Greater than 50% of the time was spent in counseling and coordination of care ______________________________________________________________________________________ [facility] Team Team Cell Phone: [phone] Please utilize secure chat with additional questions, if there is no response within 30 minutes please call the above phone number  Palliative Medicine Team providers are available by phone from 7am to 7pm daily and  can be reached through the team cell phone.  Should this patient require assistance outside of these hours, please call the patient's attending physician.

## 2020-02-28 NOTE — Progress Notes (Signed)
Patient is stable from GI perspective, with significant hepatic dysfunction, mild rise in creatinine of uncertain significance, rising ammonia which again is of uncertain significance.  She states her diarrhea is resolved.  She is being somewhat difficult to interact with this morning, which I suspect is part of her alcoholism condition/personality disorder rather than encephalopathy.  Specifically, she states she is going home today because she "has 5 days to live" and all her family is coming in to be with her.    I tried to redirect her understanding, by reiterating (as I had told her a day or 2 ago) that people in her category have a roughly 20% chance of dying in the next 3 months and emphasizing the importance of alcohol avoidance even though that is not a guarantee of survival, but she did not appear interested in hearing that, stating that "Dr. Paulita Fujita told me I was going to die."  She seems to be fixated on that.  She is inappropriate, but I do not think she is confused or overtly encephalopathic.  No new suggestions from the liver management standpoint.  At the moment, it appears unclear whether she will be signing out of the hospital or whether there will be a family conference tomorrow.  If there is a family conference tomorrow, hopefully someone from our group would be able to attend.  I am available prior to 10 AM.  However, if we are not able to be present, I think the other caregivers would be capable of conveying the above information from the GI perspective.  In other words, I do not think our presence at the conference is essential, and I would not hold up the conference for the sake of our being there.  Please feel free to call me directly on my cell phone, [phone] if you would like to discuss her case or if I can be of more specific assistance in her care.  Cleotis Nipper, M.D. Pager [phone] If no answer or after 5 PM call [phone]

## 2020-02-29 ENCOUNTER — Telehealth: Payer: Self-pay

## 2020-02-29 NOTE — Telephone Encounter (Signed)
Palliative Medicine RN Note: Rec'd a call from pt and her daughter [phone]). They did not hear from hospice yesterday and have questions. PMT is limited on how we can help d/t her not being in the hospital anymore.  I called the referral/intake dept of [facility] and asked them to please call them asap to answer questions.  Marjie Skiff Lalani Winkles, RN, BSN, [facility] Palliative Medicine Team 02/29/2020 9:42 AM Office [phone]

## 2020-03-06 DIAGNOSIS — R110 Nausea: Secondary | ICD-10-CM | POA: Diagnosis not present

## 2020-03-06 DIAGNOSIS — Z515 Encounter for palliative care: Secondary | ICD-10-CM | POA: Diagnosis not present

## 2020-03-06 DIAGNOSIS — R69 Illness, unspecified: Secondary | ICD-10-CM | POA: Diagnosis not present

## 2020-03-10 ENCOUNTER — Ambulatory Visit: Payer: Medicare (Managed Care) | Admitting: Cardiology

## 2020-04-14 DIAGNOSIS — J90 Pleural effusion, not elsewhere classified: Secondary | ICD-10-CM | POA: Diagnosis not present

## 2020-04-14 DIAGNOSIS — I2510 Atherosclerotic heart disease of native coronary artery without angina pectoris: Secondary | ICD-10-CM | POA: Diagnosis not present

## 2020-04-14 DIAGNOSIS — R0600 Dyspnea, unspecified: Secondary | ICD-10-CM | POA: Diagnosis not present

## 2020-04-14 DIAGNOSIS — Z01810 Encounter for preprocedural cardiovascular examination: Secondary | ICD-10-CM | POA: Diagnosis not present

## 2020-04-14 DIAGNOSIS — S2242XA Multiple fractures of ribs, left side, initial encounter for closed fracture: Secondary | ICD-10-CM | POA: Diagnosis not present

## 2020-04-14 DIAGNOSIS — Z87891 Personal history of nicotine dependence: Secondary | ICD-10-CM | POA: Diagnosis not present

## 2020-04-14 DIAGNOSIS — I10 Essential (primary) hypertension: Secondary | ICD-10-CM | POA: Diagnosis not present

## 2020-04-14 DIAGNOSIS — K7460 Unspecified cirrhosis of liver: Secondary | ICD-10-CM | POA: Diagnosis not present

## 2020-04-14 DIAGNOSIS — I480 Paroxysmal atrial fibrillation: Secondary | ICD-10-CM | POA: Diagnosis not present

## 2020-04-14 DIAGNOSIS — X58XXXA Exposure to other specified factors, initial encounter: Secondary | ICD-10-CM | POA: Diagnosis not present

## 2020-04-14 DIAGNOSIS — Z01818 Encounter for other preprocedural examination: Secondary | ICD-10-CM | POA: Diagnosis not present

## 2020-04-14 DIAGNOSIS — R69 Illness, unspecified: Secondary | ICD-10-CM | POA: Diagnosis not present

## 2020-04-15 DIAGNOSIS — X58XXXA Exposure to other specified factors, initial encounter: Secondary | ICD-10-CM | POA: Diagnosis not present

## 2020-04-15 DIAGNOSIS — D689 Coagulation defect, unspecified: Secondary | ICD-10-CM | POA: Diagnosis not present

## 2020-04-15 DIAGNOSIS — J90 Pleural effusion, not elsewhere classified: Secondary | ICD-10-CM | POA: Diagnosis not present

## 2020-04-15 DIAGNOSIS — R140 Abdominal distension (gaseous): Secondary | ICD-10-CM | POA: Diagnosis not present

## 2020-04-15 DIAGNOSIS — D696 Thrombocytopenia, unspecified: Secondary | ICD-10-CM | POA: Diagnosis not present

## 2020-04-15 DIAGNOSIS — D62 Acute posthemorrhagic anemia: Secondary | ICD-10-CM | POA: Diagnosis not present

## 2020-04-15 DIAGNOSIS — Z87891 Personal history of nicotine dependence: Secondary | ICD-10-CM | POA: Diagnosis not present

## 2020-04-15 DIAGNOSIS — Y33XXXA Other specified events, undetermined intent, initial encounter: Secondary | ICD-10-CM | POA: Diagnosis not present

## 2020-04-15 DIAGNOSIS — Z01811 Encounter for preprocedural respiratory examination: Secondary | ICD-10-CM | POA: Diagnosis not present

## 2020-04-15 DIAGNOSIS — I2510 Atherosclerotic heart disease of native coronary artery without angina pectoris: Secondary | ICD-10-CM | POA: Diagnosis not present

## 2020-04-15 DIAGNOSIS — D688 Other specified coagulation defects: Secondary | ICD-10-CM | POA: Diagnosis not present

## 2020-04-15 DIAGNOSIS — R188 Other ascites: Secondary | ICD-10-CM | POA: Diagnosis not present

## 2020-04-15 DIAGNOSIS — E871 Hypo-osmolality and hyponatremia: Secondary | ICD-10-CM | POA: Diagnosis not present

## 2020-04-15 DIAGNOSIS — E8770 Fluid overload, unspecified: Secondary | ICD-10-CM | POA: Diagnosis not present

## 2020-04-15 DIAGNOSIS — K7290 Hepatic failure, unspecified without coma: Secondary | ICD-10-CM | POA: Diagnosis not present

## 2020-04-15 DIAGNOSIS — I8500 Esophageal varices without bleeding: Secondary | ICD-10-CM | POA: Diagnosis not present

## 2020-04-15 DIAGNOSIS — R9431 Abnormal electrocardiogram [ECG] [EKG]: Secondary | ICD-10-CM | POA: Diagnosis not present

## 2020-04-15 DIAGNOSIS — R69 Illness, unspecified: Secondary | ICD-10-CM | POA: Diagnosis not present

## 2020-04-15 DIAGNOSIS — K7210 Chronic hepatic failure without coma: Secondary | ICD-10-CM | POA: Diagnosis not present

## 2020-04-15 DIAGNOSIS — K766 Portal hypertension: Secondary | ICD-10-CM | POA: Diagnosis not present

## 2020-04-15 DIAGNOSIS — N179 Acute kidney failure, unspecified: Secondary | ICD-10-CM | POA: Diagnosis not present

## 2020-04-15 DIAGNOSIS — Z01818 Encounter for other preprocedural examination: Secondary | ICD-10-CM | POA: Diagnosis not present

## 2020-04-15 DIAGNOSIS — S301XXA Contusion of abdominal wall, initial encounter: Secondary | ICD-10-CM | POA: Diagnosis not present

## 2020-04-15 DIAGNOSIS — N183 Chronic kidney disease, stage 3 unspecified: Secondary | ICD-10-CM | POA: Diagnosis not present

## 2020-04-15 DIAGNOSIS — E876 Hypokalemia: Secondary | ICD-10-CM | POA: Diagnosis not present

## 2020-04-15 DIAGNOSIS — R1031 Right lower quadrant pain: Secondary | ICD-10-CM | POA: Diagnosis not present

## 2020-04-15 DIAGNOSIS — K7460 Unspecified cirrhosis of liver: Secondary | ICD-10-CM | POA: Diagnosis not present

## 2020-04-16 ENCOUNTER — Telehealth: Payer: Self-pay

## 2020-04-16 NOTE — Telephone Encounter (Signed)
Attempted to contact patient to schedule a Palliative Care consult appointment. No answer left a message to return call.  

## 2020-04-18 DIAGNOSIS — Z008 Encounter for other general examination: Secondary | ICD-10-CM | POA: Diagnosis not present

## 2020-04-18 DIAGNOSIS — F4329 Adjustment disorder with other symptoms: Secondary | ICD-10-CM | POA: Diagnosis not present

## 2020-04-18 DIAGNOSIS — Z7682 Awaiting organ transplant status: Secondary | ICD-10-CM | POA: Diagnosis not present

## 2020-04-18 DIAGNOSIS — R69 Illness, unspecified: Secondary | ICD-10-CM | POA: Diagnosis not present

## 2020-04-19 DIAGNOSIS — K766 Portal hypertension: Secondary | ICD-10-CM | POA: Diagnosis not present

## 2020-04-19 DIAGNOSIS — D684 Acquired coagulation factor deficiency: Secondary | ICD-10-CM | POA: Diagnosis not present

## 2020-04-19 DIAGNOSIS — S51811D Laceration without foreign body of right forearm, subsequent encounter: Secondary | ICD-10-CM | POA: Diagnosis not present

## 2020-04-19 DIAGNOSIS — D696 Thrombocytopenia, unspecified: Secondary | ICD-10-CM | POA: Diagnosis not present

## 2020-04-19 DIAGNOSIS — E876 Hypokalemia: Secondary | ICD-10-CM | POA: Diagnosis not present

## 2020-04-19 DIAGNOSIS — N183 Chronic kidney disease, stage 3 unspecified: Secondary | ICD-10-CM | POA: Diagnosis not present

## 2020-04-19 DIAGNOSIS — R17 Unspecified jaundice: Secondary | ICD-10-CM | POA: Diagnosis not present

## 2020-04-19 DIAGNOSIS — R69 Illness, unspecified: Secondary | ICD-10-CM | POA: Diagnosis not present

## 2020-04-19 DIAGNOSIS — S301XXD Contusion of abdominal wall, subsequent encounter: Secondary | ICD-10-CM | POA: Diagnosis not present

## 2020-04-19 DIAGNOSIS — K7210 Chronic hepatic failure without coma: Secondary | ICD-10-CM | POA: Diagnosis not present

## 2020-04-20 ENCOUNTER — Encounter (HOSPITAL_COMMUNITY): Payer: Self-pay

## 2020-04-20 ENCOUNTER — Other Ambulatory Visit: Payer: Self-pay

## 2020-04-20 ENCOUNTER — Inpatient Hospital Stay (HOSPITAL_COMMUNITY)
Admission: EM | Admit: 2020-04-20 | Discharge: 2020-04-26 | DRG: 920 | Disposition: A | Discharge status: Hospice | Payer: Medicare HMO | Attending: Internal Medicine | Admitting: Internal Medicine

## 2020-04-20 DIAGNOSIS — R531 Weakness: Secondary | ICD-10-CM

## 2020-04-20 DIAGNOSIS — E876 Hypokalemia: Secondary | ICD-10-CM | POA: Diagnosis present

## 2020-04-20 DIAGNOSIS — R112 Nausea with vomiting, unspecified: Secondary | ICD-10-CM | POA: Diagnosis not present

## 2020-04-20 DIAGNOSIS — Z8249 Family history of ischemic heart disease and other diseases of the circulatory system: Secondary | ICD-10-CM

## 2020-04-20 DIAGNOSIS — Z882 Allergy status to sulfonamides status: Secondary | ICD-10-CM

## 2020-04-20 DIAGNOSIS — Z515 Encounter for palliative care: Secondary | ICD-10-CM

## 2020-04-20 DIAGNOSIS — K91870 Postprocedural hematoma of a digestive system organ or structure following a digestive system procedure: Principal | ICD-10-CM | POA: Diagnosis present

## 2020-04-20 DIAGNOSIS — Z79899 Other long term (current) drug therapy: Secondary | ICD-10-CM

## 2020-04-20 DIAGNOSIS — Z20822 Contact with and (suspected) exposure to covid-19: Secondary | ICD-10-CM | POA: Diagnosis present

## 2020-04-20 DIAGNOSIS — F1011 Alcohol abuse, in remission: Secondary | ICD-10-CM | POA: Diagnosis present

## 2020-04-20 DIAGNOSIS — R1110 Vomiting, unspecified: Secondary | ICD-10-CM | POA: Diagnosis not present

## 2020-04-20 DIAGNOSIS — I4892 Unspecified atrial flutter: Secondary | ICD-10-CM | POA: Diagnosis present

## 2020-04-20 DIAGNOSIS — R58 Hemorrhage, not elsewhere classified: Secondary | ICD-10-CM | POA: Diagnosis not present

## 2020-04-20 DIAGNOSIS — D500 Iron deficiency anemia secondary to blood loss (chronic): Secondary | ICD-10-CM | POA: Diagnosis not present

## 2020-04-20 DIAGNOSIS — Y848 Other medical procedures as the cause of abnormal reaction of the patient, or of later complication, without mention of misadventure at the time of the procedure: Secondary | ICD-10-CM | POA: Diagnosis present

## 2020-04-20 DIAGNOSIS — Z743 Need for continuous supervision: Secondary | ICD-10-CM | POA: Diagnosis not present

## 2020-04-20 DIAGNOSIS — D62 Acute posthemorrhagic anemia: Secondary | ICD-10-CM | POA: Diagnosis present

## 2020-04-20 DIAGNOSIS — T502X5A Adverse effect of carbonic-anhydrase inhibitors, benzothiadiazides and other diuretics, initial encounter: Secondary | ICD-10-CM | POA: Diagnosis present

## 2020-04-20 DIAGNOSIS — I480 Paroxysmal atrial fibrillation: Secondary | ICD-10-CM | POA: Diagnosis present

## 2020-04-20 DIAGNOSIS — K766 Portal hypertension: Secondary | ICD-10-CM | POA: Diagnosis present

## 2020-04-20 DIAGNOSIS — I5042 Chronic combined systolic (congestive) and diastolic (congestive) heart failure: Secondary | ICD-10-CM | POA: Diagnosis present

## 2020-04-20 DIAGNOSIS — I248 Other forms of acute ischemic heart disease: Secondary | ICD-10-CM | POA: Diagnosis present

## 2020-04-20 DIAGNOSIS — R9431 Abnormal electrocardiogram [ECG] [EKG]: Secondary | ICD-10-CM | POA: Diagnosis not present

## 2020-04-20 DIAGNOSIS — R159 Full incontinence of feces: Secondary | ICD-10-CM | POA: Diagnosis not present

## 2020-04-20 DIAGNOSIS — I252 Old myocardial infarction: Secondary | ICD-10-CM

## 2020-04-20 DIAGNOSIS — I2510 Atherosclerotic heart disease of native coronary artery without angina pectoris: Secondary | ICD-10-CM | POA: Diagnosis present

## 2020-04-20 DIAGNOSIS — I130 Hypertensive heart and chronic kidney disease with heart failure and stage 1 through stage 4 chronic kidney disease, or unspecified chronic kidney disease: Secondary | ICD-10-CM | POA: Diagnosis present

## 2020-04-20 DIAGNOSIS — R0689 Other abnormalities of breathing: Secondary | ICD-10-CM | POA: Diagnosis not present

## 2020-04-20 DIAGNOSIS — R000 Tachycardia, unspecified: Secondary | ICD-10-CM | POA: Diagnosis not present

## 2020-04-20 DIAGNOSIS — K7682 Hepatic encephalopathy: Secondary | ICD-10-CM | POA: Diagnosis present

## 2020-04-20 DIAGNOSIS — I8510 Secondary esophageal varices without bleeding: Secondary | ICD-10-CM | POA: Diagnosis present

## 2020-04-20 DIAGNOSIS — S3011XA Contusion of abdominal wall, initial encounter: Secondary | ICD-10-CM | POA: Diagnosis present

## 2020-04-20 DIAGNOSIS — D696 Thrombocytopenia, unspecified: Secondary | ICD-10-CM | POA: Diagnosis present

## 2020-04-20 DIAGNOSIS — Z87891 Personal history of nicotine dependence: Secondary | ICD-10-CM

## 2020-04-20 DIAGNOSIS — S301XXA Contusion of abdominal wall, initial encounter: Secondary | ICD-10-CM | POA: Diagnosis not present

## 2020-04-20 DIAGNOSIS — N1831 Chronic kidney disease, stage 3a: Secondary | ICD-10-CM | POA: Diagnosis present

## 2020-04-20 DIAGNOSIS — K7290 Hepatic failure, unspecified without coma: Secondary | ICD-10-CM | POA: Diagnosis present

## 2020-04-20 DIAGNOSIS — R197 Diarrhea, unspecified: Secondary | ICD-10-CM | POA: Diagnosis present

## 2020-04-20 DIAGNOSIS — R29898 Other symptoms and signs involving the musculoskeletal system: Secondary | ICD-10-CM

## 2020-04-20 DIAGNOSIS — K7210 Chronic hepatic failure without coma: Secondary | ICD-10-CM | POA: Diagnosis present

## 2020-04-20 DIAGNOSIS — J90 Pleural effusion, not elsewhere classified: Secondary | ICD-10-CM | POA: Clinically undetermined

## 2020-04-20 DIAGNOSIS — D689 Coagulation defect, unspecified: Secondary | ICD-10-CM | POA: Diagnosis present

## 2020-04-20 DIAGNOSIS — D6959 Other secondary thrombocytopenia: Secondary | ICD-10-CM | POA: Diagnosis present

## 2020-04-20 DIAGNOSIS — K3189 Other diseases of stomach and duodenum: Secondary | ICD-10-CM | POA: Diagnosis present

## 2020-04-20 DIAGNOSIS — K219 Gastro-esophageal reflux disease without esophagitis: Secondary | ICD-10-CM | POA: Diagnosis present

## 2020-04-20 DIAGNOSIS — K7031 Alcoholic cirrhosis of liver with ascites: Secondary | ICD-10-CM | POA: Diagnosis present

## 2020-04-20 DIAGNOSIS — R0902 Hypoxemia: Secondary | ICD-10-CM | POA: Diagnosis not present

## 2020-04-20 DIAGNOSIS — Z66 Do not resuscitate: Secondary | ICD-10-CM | POA: Diagnosis present

## 2020-04-20 MED ORDER — ONDANSETRON HCL 4 MG/2ML IJ SOLN
4.0000 mg | Freq: Once | INTRAMUSCULAR | Status: AC
  Administered 2020-04-21: 4 mg via INTRAVENOUS
  Filled 2020-04-20: qty 2

## 2020-04-20 NOTE — ED Notes (Signed)
Pt sts last encounter she wanted to be DNR. Pt request to switch preference to a FULL CODE.

## 2020-04-20 NOTE — ED Triage Notes (Signed)
Pt arrives EMS from home with c/o nausea, vomiting and abdominal distention for 2 days since discharge from [facility]. Recent admission there for "an abdominal wall" bleed. 20g L fa, 32m Zofran IVP administered by EMS.

## 2020-04-21 ENCOUNTER — Emergency Department (HOSPITAL_COMMUNITY): Payer: Medicare HMO

## 2020-04-21 ENCOUNTER — Encounter (HOSPITAL_COMMUNITY): Payer: Self-pay

## 2020-04-21 DIAGNOSIS — R1084 Generalized abdominal pain: Secondary | ICD-10-CM | POA: Diagnosis not present

## 2020-04-21 DIAGNOSIS — K3189 Other diseases of stomach and duodenum: Secondary | ICD-10-CM | POA: Diagnosis not present

## 2020-04-21 DIAGNOSIS — R531 Weakness: Secondary | ICD-10-CM | POA: Diagnosis not present

## 2020-04-21 DIAGNOSIS — I8510 Secondary esophageal varices without bleeding: Secondary | ICD-10-CM | POA: Diagnosis not present

## 2020-04-21 DIAGNOSIS — N1831 Chronic kidney disease, stage 3a: Secondary | ICD-10-CM | POA: Diagnosis not present

## 2020-04-21 DIAGNOSIS — R9431 Abnormal electrocardiogram [ECG] [EKG]: Secondary | ICD-10-CM | POA: Diagnosis not present

## 2020-04-21 DIAGNOSIS — I5042 Chronic combined systolic (congestive) and diastolic (congestive) heart failure: Secondary | ICD-10-CM | POA: Diagnosis present

## 2020-04-21 DIAGNOSIS — K7210 Chronic hepatic failure without coma: Secondary | ICD-10-CM | POA: Diagnosis not present

## 2020-04-21 DIAGNOSIS — Z515 Encounter for palliative care: Secondary | ICD-10-CM | POA: Diagnosis not present

## 2020-04-21 DIAGNOSIS — I480 Paroxysmal atrial fibrillation: Secondary | ICD-10-CM | POA: Diagnosis not present

## 2020-04-21 DIAGNOSIS — D689 Coagulation defect, unspecified: Secondary | ICD-10-CM | POA: Diagnosis present

## 2020-04-21 DIAGNOSIS — K91870 Postprocedural hematoma of a digestive system organ or structure following a digestive system procedure: Secondary | ICD-10-CM | POA: Diagnosis not present

## 2020-04-21 DIAGNOSIS — Z8249 Family history of ischemic heart disease and other diseases of the circulatory system: Secondary | ICD-10-CM | POA: Diagnosis not present

## 2020-04-21 DIAGNOSIS — S301XXA Contusion of abdominal wall, initial encounter: Secondary | ICD-10-CM | POA: Diagnosis not present

## 2020-04-21 DIAGNOSIS — T502X5A Adverse effect of carbonic-anhydrase inhibitors, benzothiadiazides and other diuretics, initial encounter: Secondary | ICD-10-CM | POA: Diagnosis not present

## 2020-04-21 DIAGNOSIS — J9811 Atelectasis: Secondary | ICD-10-CM | POA: Diagnosis not present

## 2020-04-21 DIAGNOSIS — Y848 Other medical procedures as the cause of abnormal reaction of the patient, or of later complication, without mention of misadventure at the time of the procedure: Secondary | ICD-10-CM | POA: Diagnosis not present

## 2020-04-21 DIAGNOSIS — J90 Pleural effusion, not elsewhere classified: Secondary | ICD-10-CM | POA: Diagnosis not present

## 2020-04-21 DIAGNOSIS — K7290 Hepatic failure, unspecified without coma: Secondary | ICD-10-CM | POA: Diagnosis not present

## 2020-04-21 DIAGNOSIS — E876 Hypokalemia: Secondary | ICD-10-CM

## 2020-04-21 DIAGNOSIS — I130 Hypertensive heart and chronic kidney disease with heart failure and stage 1 through stage 4 chronic kidney disease, or unspecified chronic kidney disease: Secondary | ICD-10-CM | POA: Diagnosis not present

## 2020-04-21 DIAGNOSIS — D696 Thrombocytopenia, unspecified: Secondary | ICD-10-CM | POA: Diagnosis not present

## 2020-04-21 DIAGNOSIS — Z7189 Other specified counseling: Secondary | ICD-10-CM | POA: Diagnosis not present

## 2020-04-21 DIAGNOSIS — D62 Acute posthemorrhagic anemia: Secondary | ICD-10-CM | POA: Diagnosis not present

## 2020-04-21 DIAGNOSIS — I248 Other forms of acute ischemic heart disease: Secondary | ICD-10-CM | POA: Diagnosis not present

## 2020-04-21 DIAGNOSIS — I2510 Atherosclerotic heart disease of native coronary artery without angina pectoris: Secondary | ICD-10-CM

## 2020-04-21 DIAGNOSIS — K7460 Unspecified cirrhosis of liver: Secondary | ICD-10-CM | POA: Diagnosis not present

## 2020-04-21 DIAGNOSIS — R7989 Other specified abnormal findings of blood chemistry: Secondary | ICD-10-CM | POA: Diagnosis not present

## 2020-04-21 DIAGNOSIS — K7031 Alcoholic cirrhosis of liver with ascites: Secondary | ICD-10-CM | POA: Diagnosis not present

## 2020-04-21 DIAGNOSIS — D6959 Other secondary thrombocytopenia: Secondary | ICD-10-CM | POA: Diagnosis not present

## 2020-04-21 DIAGNOSIS — Z20822 Contact with and (suspected) exposure to covid-19: Secondary | ICD-10-CM | POA: Diagnosis not present

## 2020-04-21 DIAGNOSIS — I6782 Cerebral ischemia: Secondary | ICD-10-CM | POA: Diagnosis not present

## 2020-04-21 DIAGNOSIS — R1110 Vomiting, unspecified: Secondary | ICD-10-CM | POA: Diagnosis not present

## 2020-04-21 DIAGNOSIS — F1011 Alcohol abuse, in remission: Secondary | ICD-10-CM | POA: Diagnosis present

## 2020-04-21 DIAGNOSIS — K219 Gastro-esophageal reflux disease without esophagitis: Secondary | ICD-10-CM | POA: Diagnosis not present

## 2020-04-21 DIAGNOSIS — I4892 Unspecified atrial flutter: Secondary | ICD-10-CM | POA: Diagnosis not present

## 2020-04-21 DIAGNOSIS — Z66 Do not resuscitate: Secondary | ICD-10-CM | POA: Diagnosis not present

## 2020-04-21 DIAGNOSIS — K766 Portal hypertension: Secondary | ICD-10-CM | POA: Diagnosis not present

## 2020-04-21 DIAGNOSIS — R69 Illness, unspecified: Secondary | ICD-10-CM | POA: Diagnosis not present

## 2020-04-21 DIAGNOSIS — I252 Old myocardial infarction: Secondary | ICD-10-CM | POA: Diagnosis not present

## 2020-04-21 DIAGNOSIS — R197 Diarrhea, unspecified: Secondary | ICD-10-CM | POA: Diagnosis not present

## 2020-04-21 LAB — COMPREHENSIVE METABOLIC PANEL
ALT: 33 U/L (ref 0–44)
ALT: 39 U/L (ref 0–44)
AST: 66 U/L — ABNORMAL HIGH (ref 15–41)
AST: 83 U/L — ABNORMAL HIGH (ref 15–41)
Albumin: 1.7 g/dL — ABNORMAL LOW (ref 3.5–5.0)
Albumin: 2.1 g/dL — ABNORMAL LOW (ref 3.5–5.0)
Alkaline Phosphatase: 170 U/L — ABNORMAL HIGH (ref 38–126)
Alkaline Phosphatase: 194 U/L — ABNORMAL HIGH (ref 38–126)
Anion gap: 5 (ref 5–15)
Anion gap: 6 (ref 5–15)
BUN: 14 mg/dL (ref 8–23)
BUN: 16 mg/dL (ref 8–23)
CO2: 17 mmol/L — ABNORMAL LOW (ref 22–32)
CO2: 17 mmol/L — ABNORMAL LOW (ref 22–32)
Calcium: 8.5 mg/dL — ABNORMAL LOW (ref 8.9–10.3)
Calcium: 9.3 mg/dL (ref 8.9–10.3)
Chloride: 111 mmol/L (ref 98–111)
Chloride: 110 mmol/L (ref 98–111)
Creatinine, Ser: 1.18 mg/dL — ABNORMAL HIGH (ref 0.44–1.00)
Creatinine, Ser: 0.81 mg/dL (ref 0.44–1.00)
GFR, Estimated: 52 mL/min — ABNORMAL LOW (ref >60)
GFR, Estimated: >60 mL/min (ref >60)
Glucose, Bld: 87 mg/dL (ref 70–99)
Glucose, Bld: 89 mg/dL (ref 70–99)
Potassium: 2.8 mmol/L — ABNORMAL LOW (ref 3.5–5.1)
Potassium: 3.9 mmol/L (ref 3.5–5.1)
Sodium: 133 mmol/L — ABNORMAL LOW (ref 135–145)
Sodium: 133 mmol/L — ABNORMAL LOW (ref 135–145)
Total Bilirubin: 12.3 mg/dL — ABNORMAL HIGH (ref 0.3–1.2)
Total Bilirubin: 14.4 mg/dL — ABNORMAL HIGH (ref 0.3–1.2)
Total Protein: 4 g/dL — ABNORMAL LOW (ref 6.5–8.1)
Total Protein: 4.8 g/dL — ABNORMAL LOW (ref 6.5–8.1)

## 2020-04-21 LAB — CBC
HCT: 23.4 % — ABNORMAL LOW (ref 36.0–46.0)
HCT: 25.3 % — ABNORMAL LOW (ref 36.0–46.0)
Hemoglobin: 8.2 g/dL — ABNORMAL LOW (ref 12.0–15.0)
Hemoglobin: 8.1 g/dL — ABNORMAL LOW (ref 12.0–15.0)
MCH: 35.7 pg — ABNORMAL HIGH (ref 26.0–34.0)
MCH: 34.6 pg — ABNORMAL HIGH (ref 26.0–34.0)
MCHC: 35 g/dL (ref 30.0–36.0)
MCHC: 32 g/dL (ref 30.0–36.0)
MCV: 101.7 fL — ABNORMAL HIGH (ref 80.0–100.0)
MCV: 108.1 fL — ABNORMAL HIGH (ref 80.0–100.0)
Platelets: 37 10*3/uL — ABNORMAL LOW (ref 150–400)
Platelets: 71 10*3/uL — ABNORMAL LOW (ref 150–400)
RBC: 2.3 MIL/uL — ABNORMAL LOW (ref 3.87–5.11)
RBC: 2.34 MIL/uL — ABNORMAL LOW (ref 3.87–5.11)
RDW: 23.7 % — ABNORMAL HIGH (ref 11.5–15.5)
RDW: 25.4 % — ABNORMAL HIGH (ref 11.5–15.5)
WBC: 11.5 10*3/uL — ABNORMAL HIGH (ref 4.0–10.5)
WBC: 9.3 10*3/uL (ref 4.0–10.5)
nRBC: 0 % (ref 0.0–0.2)
nRBC: 0 % (ref 0.0–0.2)

## 2020-04-21 LAB — PROTIME-INR
INR: 3.8 — ABNORMAL HIGH (ref 0.8–1.2)
INR: 2.4 — ABNORMAL HIGH (ref 0.8–1.2)
INR: 2.4 — ABNORMAL HIGH (ref 0.8–1.2)
Prothrombin Time: 36.6 seconds — ABNORMAL HIGH (ref 11.4–15.2)
Prothrombin Time: 25.7 seconds — ABNORMAL HIGH (ref 11.4–15.2)
Prothrombin Time: 25.3 seconds — ABNORMAL HIGH (ref 11.4–15.2)

## 2020-04-21 LAB — CBC WITH DIFFERENTIAL/PLATELET
Abs Immature Granulocytes: 0.1 10*3/uL — ABNORMAL HIGH (ref 0.00–0.07)
Abs Immature Granulocytes: 0.09 10*3/uL — ABNORMAL HIGH (ref 0.00–0.07)
Basophils Absolute: 0 10*3/uL (ref 0.0–0.1)
Basophils Absolute: 0 10*3/uL (ref 0.0–0.1)
Basophils Relative: 0 %
Basophils Relative: 0 %
Eosinophils Absolute: 0.1 10*3/uL (ref 0.0–0.5)
Eosinophils Absolute: 0.2 10*3/uL (ref 0.0–0.5)
Eosinophils Relative: 1 %
Eosinophils Relative: 3 %
HCT: 20 % — ABNORMAL LOW (ref 36.0–46.0)
HCT: 24.3 % — ABNORMAL LOW (ref 36.0–46.0)
Hemoglobin: 6.6 g/dL — CL (ref 12.0–15.0)
Hemoglobin: 7.9 g/dL — ABNORMAL LOW (ref 12.0–15.0)
Immature Granulocytes: 1 %
Immature Granulocytes: 1 %
Lymphocytes Relative: 8 %
Lymphocytes Relative: 12 %
Lymphs Abs: 1.1 10*3/uL (ref 0.7–4.0)
Lymphs Abs: 1.1 10*3/uL (ref 0.7–4.0)
MCH: 35.7 pg — ABNORMAL HIGH (ref 26.0–34.0)
MCH: 34.3 pg — ABNORMAL HIGH (ref 26.0–34.0)
MCHC: 33 g/dL (ref 30.0–36.0)
MCHC: 32.5 g/dL (ref 30.0–36.0)
MCV: 108.1 fL — ABNORMAL HIGH (ref 80.0–100.0)
MCV: 105.7 fL — ABNORMAL HIGH (ref 80.0–100.0)
Monocytes Absolute: 1.4 10*3/uL — ABNORMAL HIGH (ref 0.1–1.0)
Monocytes Absolute: 1.3 10*3/uL — ABNORMAL HIGH (ref 0.1–1.0)
Monocytes Relative: 11 %
Monocytes Relative: 14 %
Neutro Abs: 10.3 10*3/uL — ABNORMAL HIGH (ref 1.7–7.7)
Neutro Abs: 6.6 10*3/uL (ref 1.7–7.7)
Neutrophils Relative %: 79 %
Neutrophils Relative %: 70 %
Platelets: 47 10*3/uL — ABNORMAL LOW (ref 150–400)
Platelets: 77 10*3/uL — ABNORMAL LOW (ref 150–400)
RBC: 1.85 MIL/uL — ABNORMAL LOW (ref 3.87–5.11)
RBC: 2.3 MIL/uL — ABNORMAL LOW (ref 3.87–5.11)
RDW: 21.5 % — ABNORMAL HIGH (ref 11.5–15.5)
RDW: 24.9 % — ABNORMAL HIGH (ref 11.5–15.5)
WBC: 13 10*3/uL — ABNORMAL HIGH (ref 4.0–10.5)
WBC: 9.3 10*3/uL (ref 4.0–10.5)
nRBC: 0 % (ref 0.0–0.2)
nRBC: 0 % (ref 0.0–0.2)

## 2020-04-21 LAB — URINALYSIS, ROUTINE W REFLEX MICROSCOPIC
Bilirubin Urine: NEGATIVE
Glucose, UA: 150 mg/dL — AB
Hgb urine dipstick: NEGATIVE
Ketones, ur: 5 mg/dL — AB
Leukocytes,Ua: NEGATIVE
Nitrite: NEGATIVE
Protein, ur: 30 mg/dL — AB
Specific Gravity, Urine: 1.018 (ref 1.005–1.030)
pH: 5 (ref 5.0–8.0)

## 2020-04-21 LAB — POC OCCULT BLOOD, ED: Fecal Occult Bld: NEGATIVE

## 2020-04-21 LAB — APTT: aPTT: 33 seconds (ref 24–36)

## 2020-04-21 LAB — FIBRINOGEN
Fibrinogen: <60 mg/dL — CL (ref 210–475)
Fibrinogen: 148 mg/dL — ABNORMAL LOW (ref 210–475)

## 2020-04-21 LAB — LIPASE, BLOOD: Lipase: 55 U/L — ABNORMAL HIGH (ref 11–51)

## 2020-04-21 LAB — PREPARE RBC (CROSSMATCH)

## 2020-04-21 LAB — SARS CORONAVIRUS 2 (TAT 6-24 HRS): SARS Coronavirus 2: NEGATIVE

## 2020-04-21 LAB — MRSA PCR SCREENING: MRSA by PCR: POSITIVE — AB

## 2020-04-21 LAB — MAGNESIUM
Magnesium: 1.6 mg/dL — ABNORMAL LOW (ref 1.7–2.4)
Magnesium: 2.2 mg/dL (ref 1.7–2.4)

## 2020-04-21 MED ORDER — FENTANYL CITRATE (PF) 100 MCG/2ML IJ SOLN: 12.5000 ug | INTRAMUSCULAR | Status: DC | PRN

## 2020-04-21 MED ORDER — OXYCODONE HCL 5 MG PO TABS
5.0000 mg | ORAL_TABLET | ORAL | Status: DC | PRN
  Administered 2020-04-21 – 2020-04-26 (×12): 5 mg via ORAL
  Filled 2020-04-21 (×12): qty 1

## 2020-04-21 MED ORDER — POTASSIUM CHLORIDE 10 MEQ/100ML IV SOLN
10.0000 meq | INTRAVENOUS | Status: DC
  Administered 2020-04-21: 10 meq via INTRAVENOUS
  Filled 2020-04-21 (×2): qty 100

## 2020-04-21 MED ORDER — POTASSIUM CHLORIDE CRYS ER 20 MEQ PO TBCR
40.0000 meq | EXTENDED_RELEASE_TABLET | Freq: Once | ORAL | Status: DC
  Filled 2020-04-21: qty 2

## 2020-04-21 MED ORDER — CHLORHEXIDINE GLUCONATE CLOTH 2 % EX PADS
6.0000 | MEDICATED_PAD | Freq: Every day | CUTANEOUS | Status: DC
  Administered 2020-04-21 – 2020-04-25 (×5): 6 via TOPICAL

## 2020-04-21 MED ORDER — POTASSIUM CHLORIDE 10 MEQ/100ML IV SOLN
10.0000 meq | INTRAVENOUS | Status: AC
  Administered 2020-04-21 (×6): 10 meq via INTRAVENOUS
  Filled 2020-04-21 (×5): qty 100

## 2020-04-21 MED ORDER — SODIUM CHLORIDE 0.9 % IV SOLN
10.0000 mL/h | Freq: Once | INTRAVENOUS | Status: AC
  Administered 2020-04-21: 10 mL/h via INTRAVENOUS

## 2020-04-21 MED ORDER — SODIUM CHLORIDE 0.9% IV SOLUTION: Freq: Once | INTRAVENOUS | Status: AC

## 2020-04-21 MED ORDER — HYDROXYZINE HCL 50 MG/ML IM SOLN
25.0000 mg | Freq: Four times a day (QID) | INTRAMUSCULAR | Status: DC | PRN
  Administered 2020-04-21 – 2020-04-24 (×4): 25 mg via INTRAMUSCULAR
  Filled 2020-04-21 (×10): qty 0.5

## 2020-04-21 MED ORDER — SODIUM CHLORIDE 0.9 % IV SOLN
2.0000 g | INTRAVENOUS | Status: DC
  Administered 2020-04-21 – 2020-04-26 (×6): 2 g via INTRAVENOUS
  Filled 2020-04-21 (×6): qty 20

## 2020-04-21 MED ORDER — FENTANYL CITRATE (PF) 100 MCG/2ML IJ SOLN: 25.0000 ug | INTRAMUSCULAR | Status: DC | PRN

## 2020-04-21 MED ORDER — FENTANYL CITRATE (PF) 100 MCG/2ML IJ SOLN
50.0000 ug | Freq: Once | INTRAMUSCULAR | Status: AC
  Administered 2020-04-21: 50 ug via INTRAVENOUS
  Filled 2020-04-21: qty 2

## 2020-04-21 MED ORDER — PHYTONADIONE 5 MG PO TABS
10.0000 mg | ORAL_TABLET | Freq: Every day | ORAL | Status: AC
  Administered 2020-04-21 – 2020-04-23 (×3): 10 mg via ORAL
  Filled 2020-04-21 (×4): qty 2

## 2020-04-21 MED ORDER — POTASSIUM CHLORIDE 10 MEQ/100ML IV SOLN
10.0000 meq | INTRAVENOUS | Status: AC
  Administered 2020-04-21 (×3): 10 meq via INTRAVENOUS
  Filled 2020-04-21 (×3): qty 100

## 2020-04-21 MED ORDER — POTASSIUM CHLORIDE CRYS ER 20 MEQ PO TBCR: 40.0000 meq | EXTENDED_RELEASE_TABLET | Freq: Once | ORAL | Status: DC

## 2020-04-21 MED ORDER — MUPIROCIN 2 % EX OINT
1.0000 "application " | TOPICAL_OINTMENT | Freq: Two times a day (BID) | CUTANEOUS | Status: AC
  Administered 2020-04-21 – 2020-04-25 (×9): 1 via NASAL
  Filled 2020-04-21 (×2): qty 22

## 2020-04-21 MED ORDER — MORPHINE SULFATE (PF) 2 MG/ML IV SOLN
1.0000 mg | INTRAVENOUS | Status: DC | PRN
  Administered 2020-04-22 – 2020-04-23 (×2): 1 mg via INTRAVENOUS
  Filled 2020-04-21 (×2): qty 1

## 2020-04-21 MED ORDER — MAGNESIUM SULFATE 2 GM/50ML IV SOLN
2.0000 g | INTRAVENOUS | Status: AC
  Administered 2020-04-21: 2 g via INTRAVENOUS
  Filled 2020-04-21: qty 50

## 2020-04-21 MED ORDER — ORAL CARE MOUTH RINSE
15.0000 mL | Freq: Two times a day (BID) | OROMUCOSAL | Status: DC
  Administered 2020-04-21 – 2020-04-26 (×11): 15 mL via OROMUCOSAL

## 2020-04-21 MED ORDER — IOHEXOL 300 MG/ML  SOLN
100.0000 mL | Freq: Once | INTRAMUSCULAR | Status: AC | PRN
  Administered 2020-04-21: 100 mL via INTRAVENOUS

## 2020-04-21 MED ORDER — SPIRONOLACTONE 25 MG PO TABS
100.0000 mg | ORAL_TABLET | Freq: Every day | ORAL | Status: DC
  Administered 2020-04-21 – 2020-04-23 (×3): 100 mg via ORAL
  Filled 2020-04-21: qty 1
  Filled 2020-04-21 (×2): qty 4

## 2020-04-21 MED ORDER — SODIUM CHLORIDE 0.9 % IV BOLUS
1000.0000 mL | Freq: Once | INTRAVENOUS | Status: AC
  Administered 2020-04-21: 1000 mL via INTRAVENOUS

## 2020-04-21 MED ORDER — LACTULOSE 10 GM/15ML PO SOLN
30.0000 g | Freq: Three times a day (TID) | ORAL | Status: DC
  Administered 2020-04-21 (×2): 30 g via ORAL
  Filled 2020-04-21 (×2): qty 45

## 2020-04-21 NOTE — Progress Notes (Signed)
Date and time results received: 04/21/2020 1100  Test: MRSA   Critical Value: Positive   Name of Provider Notified: Dr. Wyline Copas   Orders Received? Or Actions Taken?: MRSA positive standing orders completed

## 2020-04-21 NOTE — Progress Notes (Signed)
PROGRESS NOTE    Dana Watkins  WUG:[id number] DOB: Jun 25, 1955 DOA: 04/20/2020 PCP: Lawerance Cruel, MD    Brief Narrative:  65 year old female with past medical history of of alcoholic cirrhosis (complicated by hepatic encephalopathy and chronic thrombocytopenia), chronic kidney disease stage IIIa, alcohol abuse, coronary artery disease (nonobstrucive mulivessel disease via cath 08/8826), systolic and diastolic congestive heart failure (EF 40-45% with G1DD) and recent complication of rectus sheath hematoma who presents to [facility] emergency department with abdominal pain and vomiting. Patietn was found to be thrombocytopenic with elevated INR in the setting of rectus sheath hematoma. GI was consulted  Assessment & Plan:   Principal Problem:   Acute blood loss anemia Active Problems:   Thrombocytopenia ([place])   Hypokalemia   Coronary artery disease involving native coronary artery of native heart without angina pectoris   Hepatic encephalopathy (HCC)   Rectus sheath hematoma   Alcoholic cirrhosis of liver with ascites (HCC)   Chronic kidney disease, stage 3a (HCC)   History of alcohol abuse   Coagulopathy (HCC)   Hypomagnesemia   Chronic combined systolic (congestive) and diastolic (congestive) heart failure (HCC)   AF (paroxysmal atrial fibrillation) (HCC)   Prolonged QT interval  Principal Problem:   Acute blood loss anemia secondary to rectus sheath hematoma in the setting of coagulopathy and thrombocytopenia   CT imaging of the abdomen and pelvis reveals enlarging rectus sheath hematoma despite embolization at [facility] last week.  This is associated with a substantial drop in hemoglobin, now at 6.6, down from 7.8 from discharge from [facility]  4/8 and down from 9.2 several weeks prior.  Vitamin K given for elevated INR  Platelets transfused overnight  Placing patient in stepdown unit for extremely close clinical monitoring  Dr. Cyd Silence discussed the CT images  with Dr. Markus Daft with interventional radiology, who did not feel that the current hematoma is expansive enough to intervene especially after the recent intervention.  Active Problems:    Hypokalemia   Recurrent severe hypokalemia  Replaced  Continued on home regimen of spironolactone 100 mg daily as blood pressure tolerates  Repeat bmet in AM    Coronary artery disease involving native coronary artery of native heart without angina pectoris   Remains chest pain-free  Monitoring patient on telemetry  No antiplatelet therapy due to coagulopathy    Hepatic encephalopathy (HCC)   To continue lactulose 30 g p.o. 3 times daily as long as patient is able to tolerate oral intake    Alcoholic cirrhosis of liver with ascites (HCC)   Advanced disease with numerous complications including hepatic encephalopathy, chronic thrombocytopenia and substantial coagulopathy  Meld score noted to be 33,  suggestive of a 52.6% three-month mortality.  Patient attempting to participate in [facility] transplant clinic  GI following    Chronic kidney disease, stage 3a ([place])   Strict intake and output monitoring  Creatinine near baseline  Minimizing nephrotoxic agents as much as possible  Serial chemistries to monitor renal function and electrolytes    History of alcohol abuse   Patient reports that she has not been active the drinking since "the holidays."  Continue to recommend complete abstinence from ETOH    Chronic combined systolic (congestive) and diastolic (congestive) heart failure ([place])   Most of patient's anasarca/volume overload is secondary to low oncotic pressure from liver disease and not due to acute heart failure  Resuming previously prescribed regimen of spironolactone 100 mg daily as blood pressure tolerates    AF (paroxysmal  atrial fibrillation) (HCC)   No need for anticoagulation due to underlying coagulopathy  Rate controlled    Prolonged  QT interval   Markedly prolonged QT interval on admission EKG, likely exacerbated by electrolyte abnormalities  Temporarily holding patient's usual regimen of daily ciprofloxacin for SBP prophylaxis and psychotropic agents  Minimizing further use of QT prolonging agents  Continue to monitor on tele  DVT prophylaxis: SCD's Code Status: DNR Family Communication: Pt in room, family not at bedside  Status is: Inpatient  Remains inpatient appropriate because:IV treatments appropriate due to intensity of illness or inability to take PO and Inpatient level of care appropriate due to severity of illness   Dispo: The patient is from: Home              Anticipated d/c is to: Home              Patient currently is not medically stable to d/c.   Difficult to place patient No   Consultants:   GI  Dr. Cyd Silence discussed with IR  Procedures:     Antimicrobials: Anti-infectives (From admission, onward)   Start     Dose/Rate Route Frequency Ordered Stop   04/21/20 1200  cefTRIAXone (ROCEPHIN) 2 g in sodium chloride 0.9 % 100 mL IVPB        2 g 200 mL/hr over 30 Minutes Intravenous Every 24 hours 04/21/20 0924         Subjective: Complains of abd discomfort this AM  Objective: Vitals:   04/21/20 1300 04/21/20 1326 04/21/20 1400 04/21/20 1522  BP: 123/67 127/62 (!) 121/59 117/79  Pulse: 94 95 96 95  Resp: (!) 22 18 20  (!) 23  Temp: 98.4 F (36.9 C) 97.8 F (36.6 C)    TempSrc: Oral Oral    SpO2: 98% 100% 100% 98%  Weight:      Height:        Intake/Output Summary (Last 24 hours) at 04/21/2020 1645 Last data filed at 04/21/2020 1612 Gross per 24 hour  Intake 2137.62 ml  Output --  Net 2137.62 ml   Filed Weights   04/21/20 0700  Weight: 80.1 kg    Examination: General exam: Awake, laying in bed, in nad Respiratory system: Normal respiratory effort, no wheezing Cardiovascular system: regular rate, s1, s2 Gastrointestinal system: Mild abd discomfort, pos  BS Central nervous system: CN2-12 grossly intact, strength intact Extremities: Perfused, no clubbing Skin: Normal skin turgor, no notable skin lesions seen Psychiatry: Mood normal // no visual hallucinations   Data Reviewed: I have personally reviewed following labs and imaging studies  CBC: Recent Labs  Lab 04/20/20 2351 04/21/20 0948  WBC 13.0* 11.5*  NEUTROABS 10.3*  --   HGB 6.6* 8.2*  HCT 20.0* 23.4*  MCV 108.1* 101.7*  PLT 47* 37*   Basic Metabolic Panel: Recent Labs  Lab 04/20/20 2351 04/21/20 0948  NA 133* 133*  K 2.8* 3.9  CL 111 110  CO2 17* 17*  GLUCOSE 87 89  BUN 14 16  CREATININE 1.18* 0.81  CALCIUM 8.5* 9.3  MG 1.6* 2.2   GFR: Estimated Creatinine Clearance: 78 mL/min (by C-G formula based on SCr of 0.81 mg/dL). Liver Function Tests: Recent Labs  Lab 04/20/20 2351 04/21/20 0948  AST 66* 83*  ALT 33 39  ALKPHOS 170* 194*  BILITOT 12.3* 14.4*  PROT 4.0* 4.8*  ALBUMIN 1.7* 2.1*   Recent Labs  Lab 04/20/20 2351  LIPASE 55*   No results for input(s): AMMONIA in the  last 168 hours. Coagulation Profile: Recent Labs  Lab 04/21/20 0028 04/21/20 0948  INR 3.8* 2.4*   Cardiac Enzymes: No results for input(s): CKTOTAL, CKMB, CKMBINDEX, TROPONINI in the last 168 hours. BNP (last 3 results) Recent Labs    08/23/19 1030  PROBNP 190.0*   HbA1C: No results for input(s): HGBA1C in the last 72 hours. CBG: No results for input(s): GLUCAP in the last 168 hours. Lipid Profile: No results for input(s): CHOL, HDL, LDLCALC, TRIG, CHOLHDL, LDLDIRECT in the last 72 hours. Thyroid Function Tests: No results for input(s): TSH, T4TOTAL, FREET4, T3FREE, THYROIDAB in the last 72 hours. Anemia Panel: No results for input(s): VITAMINB12, FOLATE, FERRITIN, TIBC, IRON, RETICCTPCT in the last 72 hours. Sepsis Labs: No results for input(s): PROCALCITON, LATICACIDVEN in the last 168 hours.  Recent Results (from the past 240 hour(s))  SARS CORONAVIRUS 2 (TAT  6-24 HRS) Nasopharyngeal Nasopharyngeal Swab     Status: None   Collection Time: 04/21/20  5:28 AM   Specimen: Nasopharyngeal Swab  Result Value Ref Range Status   SARS Coronavirus 2 NEGATIVE NEGATIVE Final    Comment: (NOTE) SARS-CoV-2 target nucleic acids are NOT DETECTED.  The SARS-CoV-2 RNA is generally detectable in upper and lower respiratory specimens during the acute phase of infection. Negative results do not preclude SARS-CoV-2 infection, do not rule out co-infections with other pathogens, and should not be used as the sole basis for treatment or other patient management decisions. Negative results must be combined with clinical observations, patient history, and epidemiological information. The expected result is Negative.  Fact Sheet for Patients: SugarRoll.be  Fact Sheet for Healthcare Providers: https://www.woods-mathews.com/  This test is not yet approved or cleared by the [place] FDA and  has been authorized for detection and/or diagnosis of SARS-CoV-2 by FDA under an Emergency Use Authorization (EUA). This EUA will remain  in effect (meaning this test can be used) for the duration of the COVID-19 declaration under Se ction 564(b)(1) of the Act, 21 U.S.C. section 360bbb-3(b)(1), unless the authorization is terminated or revoked sooner.  Performed at [facility], [place] [address], [place], [place] [address]   MRSA PCR Screening     Status: Abnormal   Collection Time: 04/21/20  6:25 AM   Specimen: Nasal Mucosa; Nasopharyngeal  Result Value Ref Range Status   MRSA by PCR POSITIVE (A) NEGATIVE Final    Comment:        The GeneXpert MRSA Assay (FDA approved for NASAL specimens only), is one component of a comprehensive MRSA colonization surveillance program. It is not intended to diagnose MRSA infection nor to guide or monitor treatment for MRSA infections. RESULT CALLED TO, READ BACK BY AND VERIFIED  WITH: CAUDLE,L. RN AT 1127 04/21/20 MULLINS,T Performed at [facility], [place] [address], [place], [place] [address]      Radiology Studies: CT ABDOMEN PELVIS W CONTRAST  Result Date: 04/21/2020 CLINICAL DATA:  Nausea, vomiting, abdominal distension EXAM: CT ABDOMEN AND PELVIS WITH CONTRAST TECHNIQUE: Multidetector CT imaging of the abdomen and pelvis was performed using the standard protocol following bolus administration of intravenous contrast. CONTRAST:  169m OMNIPAQUE IOHEXOL 300 MG/ML  SOLN COMPARISON:  06/25/2018 FINDINGS: Lower chest: Small to moderate left pleural effusion has developed, incompletely evaluated, with compressive atelectasis of the left lower lobe. Mild atelectasis or infiltrate within the visualized right middle lobe. Extensive right coronary artery calcification. Global cardiac size within normal limits. Hepatobiliary: Progressive changes of cirrhosis with increasing nodularity of the liver  surface, decreasing overall liver volume, enlargement of portosystemic vascular collaterals, and development of moderate ascites. Multiple hepatic cysts are again identified, better assessed on sonogram of 02/21/2020. No enhancing liver mass identified. No intra or extrahepatic biliary ductal dilation. Cholelithiasis noted without definite CT evidence of acute cholecystitis. Pancreas: Unremarkable Spleen: Mild splenomegaly is again identified, stable since prior examination. No intrasplenic mass identified. The splenic vein is patent. Adrenals/Urinary Tract: The adrenal glands are unremarkable. The kidneys are normal in size and position. Multiple simple cortical cysts are seen bilaterally. 3 mm nonobstructing calculus is seen within the lower pole of the left kidney. No hydronephrosis. No ureteral calculi. The bladder is unremarkable. Stomach/Bowel: The stomach and small bowel are unremarkable. There is marked circumferential wall thickening involving the cecum and ascending  colon which may reflect changes of portal colopathy, though a superimposed infectious or inflammatory ascending colitis could appear similarly. No evidence of obstruction. No free intraperitoneal gas. Moderate ascites. Vascular/Lymphatic: Progressive dilation of the a portal vein likely reflective of increasing portal venous pressure. Prominent gastroesophageal varices are again identified. Recanalization of the umbilical vein again noted. Since the prior examination, embolization coils are identified within the inferior epigastric artery as well as high attenuation intraluminal material likely representing residual glue or Onyx. There is moderate aortoiliac atherosclerotic calcification. No aortic aneurysm. No pathologic adenopathy within the abdomen and pelvis. Reproductive: Multiple enhancing nodules within the uterus likely representing multiple underlying uterine fibroids. The pelvic organs are otherwise unremarkable. Other: There is enlargement of the right rectus sheath in keeping with the given history of a rectus hematoma. There is moderate diffuse body wall subcutaneous edema. Tiny fat containing umbilical hernia. Musculoskeletal: Subacute fractures of the right superior and inferior pubic rami and right sacral ala are identified. Remote healed fracture of the left inferior pubic ramus noted. Severe T11 compression fracture has developed, age indeterminate, with near vertebral plana deformity and mild retropulsion of the superior posterior aspect of the T11 vertebral body resulting in moderate central canal stenosis. IMPRESSION: Enlargement of the a right rectus sheath in keeping with a rectus hematoma. Embolization of the right inferior epigastric artery. No evidence of active extravasation. Progressive changes of cirrhosis and portal venous hypertension with enlarging portal venous and portosystemic collateral vasculature, progressive volume loss of the liver, and development of moderate ascites.  Interval development of marked circumferential wall thickening of the cecum and ascending colon suggestive of a portal colopathy, though infectious or inflammatory ascending colitis could appear similarly. Interval development of moderate anasarca with left pleural effusion, diffuse body wall subcutaneous edema and ascites. Severe wedge compression deformity of T11 with mild retropulsion, age indeterminate. This was not seen on prior CT examination of the chest of 10/17/2019. Correlation for point tenderness is recommended. This could be better assessed with MRI examination, if indicated. Additional subacute fractures of the right superior and inferior pubic rami as well as the right sacral ala. Cholelithiasis. Minimal left nephrolithiasis. Aortic Atherosclerosis (ICD10-I70.0). Electronically Signed   By: Fidela Salisbury MD   On: 04/21/2020 03:33    Scheduled Meds: . sodium chloride   Intravenous Once  . Chlorhexidine Gluconate Cloth  6 each Topical Daily  . lactulose  30 g Oral TID  . mouth rinse  15 mL Mouth Rinse BID  . mupirocin ointment  1 application Nasal BID  . phytonadione  10 mg Oral Daily  . spironolactone  100 mg Oral Daily   Continuous Infusions: . cefTRIAXone (ROCEPHIN)  IV Stopped (04/21/20 1154)  . potassium  chloride 10 mEq (04/21/20 1626)     LOS: 0 days   Marylu Lund, MD [facility] Pager On Amion  If 7PM-7AM, please contact night-coverage 04/21/2020, 4:45 PM

## 2020-04-21 NOTE — TOC Initial Note (Signed)
Transition of Care [facility]) - Initial/Assessment Note    Patient Details  Name: Dana Watkins MRN: [id number] Date of Birth: 01-26-1955  Transition of Care [facility]) CM/SW Contact:    Leeroy Cha, RN Phone Number: 04/21/2020, 9:11 AM  Clinical Narrative:                 65 y/o female with hx of CAD s/p NSTEMI, pAF, CKD, and alcohol-related cirrhosis c/b ascites, hepatic encephalopathy, and portal hypertensive gastropathy presents to the ED for c/o abdominal pain. Abdominal pain has been persistent since discharge from [facility] 2 days ago. Describes pain as generalized. It is associated with nausea with TNTC episodes of vomiting. Has also had diarrhea today. No c/o dysuria. Abdominal SHx significant for C-section.   Recent admission to [facility] (4/5-04/18/20) for hepatic encephalopathy, hypokalemia, abdominal pain. Had admission paracentesis which grew coag neg staph; however, cell count decreased without abx suggesting contaminant. Patient also given PRBCs during this admission for acute anemia 2/2 rectus sheath hematoma, actively bleeding; successfully embolized. Hgb 7.9 at discharge. PLAN: to return pervious adl's and to have diabetic coordinator to see ac dc  Expected Discharge Plan: Home/Self Care Barriers to Discharge: Continued Medical Work up   Patient Goals and CMS Choice Patient states their goals for this hospitalization and ongoing recovery are:: to go back home CMS Medicare.gov Compare Post Acute Care list provided to:: Patient Choice offered to / list presented to : Patient  Expected Discharge Plan and Services Expected Discharge Plan: Home/Self Care   Discharge Planning Services: CM Consult   Living arrangements for the past 2 months: Apartment                                      Prior Living Arrangements/Services Living arrangements for the past 2 months: Apartment Lives with:: Self Patient language and need for interpreter reviewed:: Yes Do you feel safe  going back to the place where you live?: Yes      Need for Family Participation in Patient Care: No (Comment) Care giver support system in place?: No (comment)   Criminal Activity/Legal Involvement Pertinent to Current Situation/Hospitalization: No - Comment as needed  Activities of Daily Living Home Assistive Devices/Equipment: Walker (specify type) ADL Screening (condition at time of admission) Patient's cognitive ability adequate to safely complete daily activities?: Yes Is the patient deaf or have difficulty hearing?: No Does the patient have difficulty seeing, even when wearing glasses/contacts?: Yes Does the patient have difficulty concentrating, remembering, or making decisions?: No Patient able to express need for assistance with ADLs?: Yes Does the patient have difficulty dressing or bathing?: No Independently performs ADLs?: Yes (appropriate for developmental age) Does the patient have difficulty walking or climbing stairs?: No Weakness of Legs: None Weakness of Arms/Hands: None  Permission Sought/Granted                  Emotional Assessment Appearance:: Appears stated age Attitude/Demeanor/Rapport: Engaged Affect (typically observed): Calm Orientation: : Oriented to Place,Oriented to Self,Oriented to  Time,Oriented to Situation Alcohol / Substance Use: Not Applicable Psych Involvement: No (comment)  Admission diagnosis:  Hypokalemia [E87.6] Acute blood loss anemia [D62] Rectus sheath hematoma, initial encounter [S30.1XXA] Nausea and vomiting, intractability of vomiting not specified, unspecified vomiting type [R11.2] Patient Active Problem List   Diagnosis Date Noted  . Acute blood loss anemia 04/21/2020  . Rectus sheath hematoma 04/21/2020  . Alcoholic cirrhosis of  liver with ascites ([place]) 04/21/2020  . Chronic kidney disease, stage 3a ([place]) 04/21/2020  . History of alcohol abuse 04/21/2020  . Coagulopathy ([place]) 04/21/2020  . Hypomagnesemia 04/21/2020  .  Chronic combined systolic (congestive) and diastolic (congestive) heart failure ([place]) 04/21/2020  . AF (paroxysmal atrial fibrillation) ([place]) 04/21/2020  . Prolonged QT interval 04/21/2020  . Palliative care by specialist   . Goals of care, counseling/discussion   . Advance care planning   . Abnormal liver function tests   . Liver failure without hepatic coma ([place]) 02/20/2020  . Hepatic encephalopathy ([place]) 02/20/2020  . Fall   . Hemothorax on left 08/24/2019  . Acute on chronic respiratory failure ([place]) 08/07/2019  . Acute coronary syndrome ([place]) 08/06/2019  . Anemia due to stage 3 chronic kidney disease ([place]) 04/16/2019  . Fever 02/21/2019  . Hypophosphatemia   . Recurrent Clostridioides difficile diarrhea 12/11/2018  . Weakness 12/09/2018  . Hyperbilirubinemia 10/31/2018  . C. difficile diarrhea 10/31/2018  . Hepatic cirrhosis ([place]) 10/31/2018  . Systolic murmur at cardiac apex 10/31/2018  . Sepsis ([place]) 10/30/2018  . Normocytic anemia 10/23/2018  . SIRS (systemic inflammatory response syndrome) ([place]) 10/16/2018  . Acute renal failure superimposed on stage 3 chronic kidney disease ([place]) 10/16/2018  . Acute lower UTI 10/16/2018  . Elevated troponin 10/16/2018  . Elevated d-dimer 10/16/2018  . UTI (urinary tract infection) 06/25/2018  . Community acquired pneumonia of right middle lobe of lung 06/09/2018  . AKI (acute kidney injury) ([place]) 06/09/2018  . GAD (generalized anxiety disorder) 11/06/2017  . Abnormal findings on diagnostic imaging of heart and coronary circulation   . Family history of coronary artery disease 05/09/2017  . DOE (dyspnea on exertion) 05/09/2017  . Coronary artery disease involving native coronary artery of native heart without angina pectoris 03/08/2017  . Acute bronchitis 02/06/2017  . Dehydration   . Diarrhea 02/03/2017  . Nausea & vomiting 02/02/2017  . Arthritis 05/15/2015  . Benign essential hypertension 05/15/2015  . Migraine headache  05/15/2015  . Hypokalemia 11/26/2014  . Alkaline phosphatase elevation 11/26/2014  . Abnormal weight gain 11/26/2014  . Chronic leukopenia 05/28/2014  . Symptomatic anemia 05/28/2014  . Thrombocytopenia ([place]) 04/30/2013  . Elevated liver function tests 04/30/2013   PCP:  Lawerance Cruel, MD Pharmacy:   [facility] [place] [address] [place] [facility] [address] [place] [address] Phone: [phone] Fax: [phone]  [facility] [place] [address] [place] [address] Phone: [phone] Fax: [phone]     Social Determinants of Health (SDOH) Interventions    Readmission Risk Interventions Readmission Risk Prevention Plan 08/14/2019 12/11/2018  Transportation Screening Complete Complete  PCP or Specialist Appt within 3-5 Days Complete Complete  HRI or [place] Complete Complete  Social Work Consult for [place] Planning/Counseling Complete Complete  Palliative Care Screening Not Applicable Not Applicable  Medication Review Press photographer) Complete Referral to Pharmacy  Some recent data might be hidden

## 2020-04-21 NOTE — ED Provider Notes (Addendum)
[facility] DEPT Provider Note   CSN: [id number] Arrival date & time: 04/20/20  2326     History Chief Complaint  Patient presents with  . Vomiting    Dana Watkins is a 65 y.o. female.  65 y/o female with hx of CAD s/p NSTEMI, pAF, CKD, and alcohol-related cirrhosis c/b ascites, hepatic encephalopathy, and portal hypertensive gastropathy presents to the ED for c/o abdominal pain. Abdominal pain has been persistent since discharge from [facility] 2 days ago. Describes pain as generalized. It is associated with nausea with TNTC episodes of vomiting. Has also had diarrhea today. No c/o dysuria. Abdominal SHx significant for C-section.   Recent admission to [facility] (4/5-04/18/20) for hepatic encephalopathy, hypokalemia, abdominal pain. Had admission paracentesis which grew coag neg staph; however, cell count decreased without abx suggesting contaminant. Patient also given PRBCs during this admission for acute anemia 2/2 rectus sheath hematoma, actively bleeding; successfully embolized. Hgb 7.9 at discharge.  Followed by Dr. Paulita Fujita (GI)       Past Medical History:  Diagnosis Date  . Acute hypokalemia 11/26/2014  . Arthritis 05/15/2015  . Benign essential hypertension 05/15/2015  . C. difficile colitis   . CAD (coronary artery disease)    a. cath 04/2017: ""Diffuse, calcific CAD particularly in the distal RCA and proximal to mid LAD.  LAD disease is nonobstructive.  RCA disease is more severe but does not appear significant.  Given her lack of symptoms, would pursue medical therapy."  . CKD (chronic kidney disease), stage III ([place])   . Hypokalemia   . Migraine headache 05/15/2015  . Mild mitral regurgitation   . Pancytopenia ([place])   . Sepsis ([place]) 01/2017  . Thrombocytopenia ([place])    a. chronic thrombocytopenia (ITP - remotely saw hematology).  . Vitamin D deficiency     Patient Active Problem List   Diagnosis Date Noted  . Acute blood loss anemia 04/21/2020   . Palliative care by specialist   . Goals of care, counseling/discussion   . Advance care planning   . Abnormal liver function tests   . Liver failure without hepatic coma ([place]) 02/20/2020  . Liver failure ([place]) 02/20/2020  . Fall   . Hemothorax on left 08/24/2019  . Acute on chronic respiratory failure ([place]) 08/07/2019  . Acute coronary syndrome ([place]) 08/06/2019  . Anemia due to stage 3 chronic kidney disease ([place]) 04/16/2019  . Fever 02/21/2019  . Hypophosphatemia   . Recurrent Clostridioides difficile diarrhea 12/11/2018  . Weakness 12/09/2018  . Hyperbilirubinemia 10/31/2018  . C. difficile diarrhea 10/31/2018  . Hepatic cirrhosis ([place]) 10/31/2018  . Systolic murmur at cardiac apex 10/31/2018  . Sepsis ([place]) 10/30/2018  . Normocytic anemia 10/23/2018  . SIRS (systemic inflammatory response syndrome) ([place]) 10/16/2018  . Acute renal failure superimposed on stage 3 chronic kidney disease ([place]) 10/16/2018  . Acute lower UTI 10/16/2018  . Elevated troponin 10/16/2018  . Elevated d-dimer 10/16/2018  . UTI (urinary tract infection) 06/25/2018  . Community acquired pneumonia of right middle lobe of lung 06/09/2018  . AKI (acute kidney injury) ([place]) 06/09/2018  . GAD (generalized anxiety disorder) 11/06/2017  . Abnormal findings on diagnostic imaging of heart and coronary circulation   . Family history of coronary artery disease 05/09/2017  . DOE (dyspnea on exertion) 05/09/2017  . Coronary artery calcification seen on CT scan 03/08/2017  . Acute bronchitis 02/06/2017  . Dehydration   . Diarrhea 02/03/2017  . Nausea & vomiting 02/02/2017  . Arthritis 05/15/2015  .  Benign essential hypertension 05/15/2015  . Migraine headache 05/15/2015  . Hypokalemia 11/26/2014  . Alkaline phosphatase elevation 11/26/2014  . Abnormal weight gain 11/26/2014  . Chronic leukopenia 05/28/2014  . Symptomatic anemia 05/28/2014  . Thrombocytopenia ([place]) 04/30/2013  . Elevated liver function tests  04/30/2013    Past Surgical History:  Procedure Laterality Date  . CESAREAN SECTION     24 years ago  . ESOPHAGOGASTRODUODENOSCOPY (EGD) WITH PROPOFOL N/A 06/13/2019   Procedure: ESOPHAGOGASTRODUODENOSCOPY (EGD) WITH PROPOFOL;  Surgeon: Arta Silence, MD;  Location: [facility] ENDOSCOPY;  Service: Endoscopy;  Laterality: N/A;  administration of one bag of platelets   . LEFT HEART CATH AND CORONARY ANGIOGRAPHY N/A 05/12/2017   Procedure: LEFT HEART CATH AND CORONARY ANGIOGRAPHY;  Surgeon: [name] Booze, MD;  Location: [facility];  Service: Cardiovascular;  Laterality: N/A;  . THORACENTESIS N/A 08/28/2019   Procedure: Mathews Robinsons;  Surgeon: Candee Furbish, MD;  Location: [facility] ENDOSCOPY;  Service: Pulmonary;  Laterality: N/A;  . tonsils and adneoids     as a child  . ULTRASOUND GUIDANCE FOR VASCULAR ACCESS  05/12/2017   Procedure: Ultrasound Guidance For Vascular Access;  Surgeon: [name] Booze, MD;  Location: [place] [facility];  Service: Cardiovascular;;     OB History   No obstetric history on file.     Family History  Problem Relation Age of Onset  . Pernicious anemia Maternal Grandfather   . Heart attack Maternal Grandfather   . Atrial fibrillation Mother   . Supraventricular tachycardia Father   . Heart disease Paternal Grandfather     Social History   Tobacco Use  . Smoking status: Former Smoker    Years: 2.00    Start date: 06/25/1968    Quit date: 05/16/2014    Years since quitting: 5.9  . Smokeless tobacco: Never Used  Vaping Use  . Vaping Use: Never used  Substance Use Topics  . Alcohol use: Yes    Comment: occ  . Drug use: No    Home Medications Prior to Admission medications   Medication Sig Start Date End Date Taking? Authorizing Provider  ciprofloxacin (CIPRO) 500 MG tablet Take 500 mg by mouth at bedtime. 04/18/20  Yes [provider]  Ascorbic Acid (VITAMIN C) 1000 MG tablet Take 1,000 mg by mouth in the morning and at bedtime.  Chewable    [provider]  B Complex Vitamins (B COMPLEX PO) Take by mouth.    [provider]  carvedilol (COREG) 3.125 MG tablet Take 1 tablet (3.125 mg total) by mouth 2 (two) times daily with a meal. Patient not taking: Reported on 04/20/2020 12/25/19   [name], MD  FLUoxetine (PROZAC) 20 MG capsule Take 60 mg by mouth every morning. 04/16/20   [provider]  furosemide (LASIX) 40 MG tablet Take 40 mg by mouth daily. 04/09/20   [provider]  haloperidol (HALDOL) 5 MG tablet Take by mouth. 03/21/20   [provider]  hydrOXYzine (ATARAX/VISTARIL) 10 MG tablet TAKE 1 TABLET BY MOUTH EVERY 4HRS AS NEEDED FOR ITCHING 04/01/20   [provider]  LORazepam (ATIVAN) 1 MG tablet Take by mouth. 03/21/20   [provider]  MAGNESIUM PO Take 400 mg by mouth daily.     [provider]  Multiple Vitamin (MULTIVITAMIN WITH MINERALS) TABS tablet Take 1 tablet by mouth daily. 06/29/18   Raiford Noble Latif, DO  potassium chloride SA (KLOR-CON) 20 MEQ tablet Take 1 tablet (20 mEq  total) by mouth 2 (two) times daily. Patient not taking: Reported on 04/20/2020 08/14/19   Dixie Dials, MD  spironolactone (ALDACTONE) 25 MG tablet Take 25 mg by mouth daily. Patient not taking: Reported on 04/20/2020    [provider]    Allergies    Sulfa antibiotics  Review of Systems   Review of Systems  Ten systems reviewed and are negative for acute change, except as noted in the HPI.    Physical Exam Updated Vital Signs BP 107/63   Pulse 97   Temp 98.4 F (36.9 C) (Oral)   Resp (!) 22   SpO2 97%   Physical Exam Vitals and nursing note reviewed.  Constitutional:      General: She is not in acute distress.    Appearance: She is well-developed. She is not diaphoretic.     Comments: Chronically ill appearing  HENT:     Head: Normocephalic and atraumatic.  Eyes:     General: Scleral icterus present.      Conjunctiva/sclera: Conjunctivae normal.  Cardiovascular:     Rate and Rhythm: Regular rhythm. Tachycardia present.     Pulses: Normal pulses.     Comments: HR low 100's Pulmonary:     Effort: Pulmonary effort is normal. No respiratory distress.     Comments: Respirations even and unlabored Abdominal:     Tenderness: There is abdominal tenderness.       Comments: Diffuse abdominal TTP without focal tenderness or guarding. No peritoneal signs.  Musculoskeletal:        General: Normal range of motion.     Cervical back: Normal range of motion.  Skin:    General: Skin is warm and dry.     Coloration: Skin is jaundiced. Skin is not pale.     Findings: No erythema or rash.  Neurological:     Mental Status: She is alert and oriented to person, place, and time.     Coordination: Coordination normal.     Comments: GCS 15.  Answers questions appropriately and follows commands.  Moving all extremities spontaneously and able to transition in the bed without assistance.  Psychiatric:        Behavior: Behavior normal.     ED Results / Procedures / Treatments   Labs (all labs ordered are listed, but only abnormal results are displayed) Labs Reviewed  CBC WITH DIFFERENTIAL/PLATELET - Abnormal; Notable for the following components:      Result Value   WBC 13.0 (*)    RBC 1.85 (*)    Hemoglobin 6.6 (*)    HCT 20.0 (*)    MCV 108.1 (*)    MCH 35.7 (*)    RDW 21.5 (*)    Platelets 47 (*)    Neutro Abs 10.3 (*)    Monocytes Absolute 1.4 (*)    Abs Immature Granulocytes 0.10 (*)    All other components within normal limits  COMPREHENSIVE METABOLIC PANEL - Abnormal; Notable for the following components:   Sodium 133 (*)    Potassium 2.8 (*)    CO2 17 (*)    Creatinine, Ser 1.18 (*)    Calcium 8.5 (*)    Total Protein 4.0 (*)    Albumin 1.7 (*)    AST 66 (*)    Alkaline Phosphatase 170 (*)    Total Bilirubin 12.3 (*)    GFR, Estimated 52 (*)    All other components within normal  limits  LIPASE, BLOOD - Abnormal; Notable for the following components:  Lipase 55 (*)    All other components within normal limits  URINALYSIS, ROUTINE W REFLEX MICROSCOPIC - Abnormal; Notable for the following components:   Color, Urine AMBER (*)    APPearance HAZY (*)    Glucose, UA 150 (*)    Ketones, ur 5 (*)    Protein, ur 30 (*)    Bacteria, UA RARE (*)    All other components within normal limits  PROTIME-INR - Abnormal; Notable for the following components:   Prothrombin Time 36.6 (*)    INR 3.8 (*)    All other components within normal limits  MAGNESIUM - Abnormal; Notable for the following components:   Magnesium 1.6 (*)    All other components within normal limits  SARS CORONAVIRUS 2 (TAT 6-24 HRS)  FIBRINOGEN  POC OCCULT BLOOD, ED  TYPE AND SCREEN  PREPARE RBC (CROSSMATCH)  PREPARE PLATELET PHERESIS  PREPARE CRYOPRECIPITATE    EKG None  Radiology CT ABDOMEN PELVIS W CONTRAST  Result Date: 04/21/2020 CLINICAL DATA:  Nausea, vomiting, abdominal distension EXAM: CT ABDOMEN AND PELVIS WITH CONTRAST TECHNIQUE: Multidetector CT imaging of the abdomen and pelvis was performed using the standard protocol following bolus administration of intravenous contrast. CONTRAST:  113m OMNIPAQUE IOHEXOL 300 MG/ML  SOLN COMPARISON:  06/25/2018 FINDINGS: Lower chest: Small to moderate left pleural effusion has developed, incompletely evaluated, with compressive atelectasis of the left lower lobe. Mild atelectasis or infiltrate within the visualized right middle lobe. Extensive right coronary artery calcification. Global cardiac size within normal limits. Hepatobiliary: Progressive changes of cirrhosis with increasing nodularity of the liver surface, decreasing overall liver volume, enlargement of portosystemic vascular collaterals, and development of moderate ascites. Multiple hepatic cysts are again identified, better assessed on sonogram of 02/21/2020. No enhancing liver mass  identified. No intra or extrahepatic biliary ductal dilation. Cholelithiasis noted without definite CT evidence of acute cholecystitis. Pancreas: Unremarkable Spleen: Mild splenomegaly is again identified, stable since prior examination. No intrasplenic mass identified. The splenic vein is patent. Adrenals/Urinary Tract: The adrenal glands are unremarkable. The kidneys are normal in size and position. Multiple simple cortical cysts are seen bilaterally. 3 mm nonobstructing calculus is seen within the lower pole of the left kidney. No hydronephrosis. No ureteral calculi. The bladder is unremarkable. Stomach/Bowel: The stomach and small bowel are unremarkable. There is marked circumferential wall thickening involving the cecum and ascending colon which may reflect changes of portal colopathy, though a superimposed infectious or inflammatory ascending colitis could appear similarly. No evidence of obstruction. No free intraperitoneal gas. Moderate ascites. Vascular/Lymphatic: Progressive dilation of the a portal vein likely reflective of increasing portal venous pressure. Prominent gastroesophageal varices are again identified. Recanalization of the umbilical vein again noted. Since the prior examination, embolization coils are identified within the inferior epigastric artery as well as high attenuation intraluminal material likely representing residual glue or Onyx. There is moderate aortoiliac atherosclerotic calcification. No aortic aneurysm. No pathologic adenopathy within the abdomen and pelvis. Reproductive: Multiple enhancing nodules within the uterus likely representing multiple underlying uterine fibroids. The pelvic organs are otherwise unremarkable. Other: There is enlargement of the right rectus sheath in keeping with the given history of a rectus hematoma. There is moderate diffuse body wall subcutaneous edema. Tiny fat containing umbilical hernia. Musculoskeletal: Subacute fractures of the right superior  and inferior pubic rami and right sacral ala are identified. Remote healed fracture of the left inferior pubic ramus noted. Severe T11 compression fracture has developed, age indeterminate, with near vertebral plana deformity and mild  retropulsion of the superior posterior aspect of the T11 vertebral body resulting in moderate central canal stenosis. IMPRESSION: Enlargement of the a right rectus sheath in keeping with a rectus hematoma. Embolization of the right inferior epigastric artery. No evidence of active extravasation. Progressive changes of cirrhosis and portal venous hypertension with enlarging portal venous and portosystemic collateral vasculature, progressive volume loss of the liver, and development of moderate ascites. Interval development of marked circumferential wall thickening of the cecum and ascending colon suggestive of a portal colopathy, though infectious or inflammatory ascending colitis could appear similarly. Interval development of moderate anasarca with left pleural effusion, diffuse body wall subcutaneous edema and ascites. Severe wedge compression deformity of T11 with mild retropulsion, age indeterminate. This was not seen on prior CT examination of the chest of 10/17/2019. Correlation for point tenderness is recommended. This could be better assessed with MRI examination, if indicated. Additional subacute fractures of the right superior and inferior pubic rami as well as the right sacral ala. Cholelithiasis. Minimal left nephrolithiasis. Aortic Atherosclerosis (ICD10-I70.0). Electronically Signed   By: Fidela Salisbury MD   On: 04/21/2020 03:33    Procedures .Critical Care Performed by: Antonietta Breach, PA-C Authorized by: Antonietta Breach, PA-C   Critical care provider statement:    Critical care time (minutes):  45   Critical care was necessary to treat or prevent imminent or life-threatening deterioration of the following conditions:  Hepatic failure and circulatory failure    Critical care was time spent personally by me on the following activities:  Discussions with consultants, evaluation of patient's response to treatment, examination of patient, ordering and performing treatments and interventions, ordering and review of laboratory studies, ordering and review of radiographic studies, pulse oximetry, re-evaluation of patient's condition, obtaining history from patient or surrogate and review of old charts     Medications Ordered in ED Medications  potassium chloride 10 mEq in 100 mL IVPB (10 mEq Intravenous New Bag/Given 04/21/20 0505)  0.9 %  sodium chloride infusion (Manually program via Guardrails IV Fluids) (has no administration in time range)  phytonadione (VITAMIN K) tablet 10 mg (has no administration in time range)  0.9 %  sodium chloride infusion (Manually program via Guardrails IV Fluids) (has no administration in time range)  ondansetron (ZOFRAN) injection 4 mg (4 mg Intravenous Given 04/21/20 0021)  iohexol (OMNIPAQUE) 300 MG/ML solution 100 mL (100 mLs Intravenous Contrast Given 04/21/20 0241)  sodium chloride 0.9 % bolus 1,000 mL (0 mLs Intravenous Stopped 04/21/20 0436)  magnesium sulfate IVPB 2 g 50 mL (2 g Intravenous New Bag/Given 04/21/20 0410)  fentaNYL (SUBLIMAZE) injection 50 mcg (50 mcg Intravenous Given 04/21/20 0459)  0.9 %  sodium chloride infusion (10 mL/hr Intravenous New Bag/Given 04/21/20 0535)    ED Course  I have reviewed the triage vital signs and the nursing notes.  Pertinent labs & imaging results that were available during my care of the patient were reviewed by me and considered in my medical decision making (see chart for details).  Clinical Course as of 04/21/20 0539  Mon Apr 21, 2020  8242 Rectal temp checked with RN which was 98.1F [KH]  0344 Positive Covid test in February 2022.  Repeat testing not recommended. [KH]    Clinical Course User Index [KH] Beverely Pace   MDM Rules/Calculators/A&P                           65 year old female with complex medical history and  alcohol related cirrhosis presents to the emergency department for abdominal pain, vomiting, diarrhea.  She was discharged from [facility] 2 days ago after hospitalization for rectus sheath hematoma which is status post embolization.  Also had hypokalemia and anemia during this admission.  Received 2 units PRBCs.  Patient with very impressive abdominal wall hematoma on bedside exam.  She does have a drop in her hemoglobin compared to hospital discharge 2 days ago.  Hemoglobin at [facility] was 7.9, now 6.6 today.  While it is very possible this hemoglobin drop is related to her hematoma, her abdominal CT does not suggest active extravasation at this time.  Hemoccult negative.  Will require trending.  Ordered 1 unit PRBCs for transfusion.  Patient has also received IV potassium and magnesium to replete hypokalemia and hypomagnesemia.  Her bilirubin is elevated from baseline, but favored to be related to hematoma.  LFTs otherwise are stable.  INR chronically elevated.  Plan for admission to [facility] with GI consultation for ongoing management.  Dr. Therisa Doyne of [facility] alerted to need for inpatient consult.   Final Clinical Impression(s) / ED Diagnoses Final diagnoses:  Acute blood loss anemia  Rectus sheath hematoma, initial encounter  Hypokalemia  Nausea and vomiting, intractability of vomiting not specified, unspecified vomiting type    Rx / DC Orders ED Discharge Orders    None       Antonietta Breach, PA-C 04/21/20 0545    Antonietta Breach, PA-C 04/21/20 0546    Mesner, Corene Cornea, MD 04/21/20 [phone]

## 2020-04-21 NOTE — Progress Notes (Signed)
[facility] [address] [employer] [facility]) Hospital Liaison note:   This is a pending outpatient-based Palliative Care patient. Will continue to follow for disposition.  Please call with any outpatient palliative questions or concerns.  Thank you, Lorelee Market, LPN [place]

## 2020-04-21 NOTE — Consult Note (Addendum)
Referring Provider: Antonietta Breach, PA-C Primary Care Physician:  Lawerance Cruel, MD Primary Gastroenterologist:  Dr. Paulita Fujita [facility])  Reason for Consultation:  Decompensated cirrhosis, abdominal pain  HPI: Dana Watkins is a 65 y.o. female with recent rectus sheath hematoma s/p IR embolization with past medical history of CAD s/p NSTEMI, pAF, CKD, and alcohol-related cirrhosis with ascites, hepatic encephalopathy, and portal hypertensive gastropathy presenting for consultation.    Patient presented to the ED yesterday due to abdominal pain.  She was recently hospitalized at [facility] from 4/5-04/18/2020 with hypokalemia.  [facility] recommended spironolactone monotherapy, given severe hypokalemia.  She was found to have a rectal sheath hematoma, which was embolized by [facility].    Patient reports continued diffuse abdominal pain.  Reports "constant" nausea with intermittent vomiting.  Denies hematemesis or coffee ground emesis.  Denies melena or hematochezia.   States she has not consumed alcohol since Christmas.     Past Medical History:  Diagnosis Date  . Acute hypokalemia 11/26/2014  . Arthritis 05/15/2015  . Benign essential hypertension 05/15/2015  . C. difficile colitis   . CAD (coronary artery disease)    a. cath 04/2017: ""Diffuse, calcific CAD particularly in the distal RCA and proximal to mid LAD.  LAD disease is nonobstructive.  RCA disease is more severe but does not appear significant.  Given her lack of symptoms, would pursue medical therapy."  . CKD (chronic kidney disease), stage III ([place])   . Hypokalemia   . Migraine headache 05/15/2015  . Mild mitral regurgitation   . Pancytopenia ([place])   . Sepsis ([place]) 01/2017  . Thrombocytopenia ([place])    a. chronic thrombocytopenia ([facility] - remotely saw hematology).  . Vitamin D deficiency     Past Surgical History:  Procedure Laterality Date  . CESAREAN SECTION     24 years ago  . ESOPHAGOGASTRODUODENOSCOPY (EGD) WITH PROPOFOL  N/A 06/13/2019   Procedure: ESOPHAGOGASTRODUODENOSCOPY (EGD) WITH PROPOFOL;  Surgeon: Arta Silence, MD;  Location: [facility] ENDOSCOPY;  Service: Endoscopy;  Laterality: N/A;  administration of one bag of platelets   . LEFT HEART CATH AND CORONARY ANGIOGRAPHY N/A 05/12/2017   Procedure: LEFT HEART CATH AND CORONARY ANGIOGRAPHY;  Surgeon: [name] Booze, MD;  Location: [place] [facility];  Service: Cardiovascular;  Laterality: N/A;  . THORACENTESIS N/A 08/28/2019   Procedure: Mathews Robinsons;  Surgeon: Candee Furbish, MD;  Location: [facility] ENDOSCOPY;  Service: Pulmonary;  Laterality: N/A;  . tonsils and adneoids     as a child  . ULTRASOUND GUIDANCE FOR VASCULAR ACCESS  05/12/2017   Procedure: Ultrasound Guidance For Vascular Access;  Surgeon: [name] Booze, MD;  Location: [place] [facility];  Service: Cardiovascular;;    Prior to Admission medications   Medication Sig Start Date End Date Taking? Authorizing Provider  ciprofloxacin (CIPRO) 500 MG tablet Take 500 mg by mouth at bedtime. 04/18/20  Yes [provider]  Ascorbic Acid (VITAMIN C) 1000 MG tablet Take 1,000 mg by mouth in the morning and at bedtime. Chewable    [provider]  B Complex Vitamins (B COMPLEX PO) Take by mouth.    [provider]  carvedilol (COREG) 3.125 MG tablet Take 1 tablet (3.125 mg total) by mouth 2 (two) times daily with a meal. Patient not taking: Reported on 04/20/2020 12/25/19   [name], MD  FLUoxetine (PROZAC) 20 MG capsule Take 60 mg by mouth every morning. 04/16/20   [provider]  furosemide (LASIX) 40 MG tablet  Take 40 mg by mouth daily. 04/09/20   [provider]  haloperidol (HALDOL) 5 MG tablet Take by mouth. 03/21/20   [provider]  hydrOXYzine (ATARAX/VISTARIL) 10 MG tablet TAKE 1 TABLET BY MOUTH EVERY 4HRS AS NEEDED FOR ITCHING 04/01/20   [provider]  LORazepam (ATIVAN) 1 MG tablet Take by mouth. 03/21/20   [provider]  MAGNESIUM PO Take 400 mg by mouth daily.     [provider]  Multiple Vitamin (MULTIVITAMIN WITH MINERALS) TABS tablet Take 1 tablet by mouth daily. 06/29/18   Raiford Noble Latif, DO  potassium chloride SA (KLOR-CON) 20 MEQ tablet Take 1 tablet (20 mEq total) by mouth 2 (two) times daily. Patient not taking: Reported on 04/20/2020 08/14/19   Dixie Dials, MD  spironolactone (ALDACTONE) 25 MG tablet Take 25 mg by mouth daily. Patient not taking: Reported on 04/20/2020    [provider]    Scheduled Meds: . sodium chloride   Intravenous Once  . Chlorhexidine Gluconate Cloth  6 each Topical Daily  . lactulose  30 g Oral TID  . mouth rinse  15 mL Mouth Rinse BID  . phytonadione  10 mg Oral Daily  . spironolactone  100 mg Oral Daily   Continuous Infusions: . potassium chloride 10 mEq (04/21/20 0811)   PRN Meds:.  Allergies as of 04/20/2020 - Review Complete 04/20/2020  Allergen Reaction Noted  . Sulfa antibiotics Nausea And Vomiting, Rash, and Shortness Of Breath 04/30/2013    Family History  Problem Relation Age of Onset  . Pernicious anemia Maternal Grandfather   . Heart attack Maternal Grandfather   . Atrial fibrillation Mother   . Supraventricular tachycardia Father   . Heart disease Paternal Grandfather     Social History   Socioeconomic History  . Marital status: Divorced    Spouse name: Not on file  . Number of children: Not on file  . Years of education: Not on file  . Highest education level: Not on file  Occupational History  . Not on file  Tobacco Use  . Smoking status: Former Smoker    Years: 2.00    Start date: 06/25/1968    Quit date: 05/16/2014    Years since quitting: 5.9  . Smokeless tobacco: Never Used  Vaping Use  . Vaping Use: Never used  Substance and Sexual Activity  . Alcohol use: Yes    Comment: occ  . Drug use: No  . Sexual activity: Not on file  Other Topics Concern  . Not on file  Social History Narrative   Lives  with husband  In home.  Has seasonal allergies.  Drinks coffee, 1 cup AM and 3 plus sweet tea.  Has 3 grown kids.     Social Determinants of Health   Financial Resource Strain: Not on file  Food Insecurity: Not on file  Transportation Needs: Not on file  Physical Activity: Not on file  Stress: Not on file  Social Connections: Not on file  Intimate Partner Violence: Not on file    Review of Systems: Review of Systems  Constitutional: Positive for malaise/fatigue. Negative for fever.  HENT: Negative for hearing loss and tinnitus.   Eyes: Negative for pain and redness.  Respiratory: Negative for cough and shortness of breath.   Cardiovascular: Negative for chest pain and palpitations.  Gastrointestinal: Positive for abdominal pain, nausea and vomiting. Negative for blood in stool, constipation, diarrhea, heartburn and melena.  Genitourinary: Negative for flank pain and  hematuria.  Musculoskeletal: Negative for myalgias and neck pain.  Skin: Negative for itching and rash.  Neurological: Negative for seizures and loss of consciousness.  Endo/Heme/Allergies: Negative for polydipsia. Does not bruise/bleed easily.  Psychiatric/Behavioral: Positive for substance abuse. Memory loss: .phy. The patient is not nervous/anxious.      Physical Exam: Vital signs: Vitals:   04/21/20 0710 04/21/20 0800  BP: 119/65 121/71  Pulse: 93 95  Resp: 16 18  Temp:    SpO2: 98% 95%   Last BM Date: 04/20/20  Physical Exam Vitals reviewed.  Constitutional:      General: She is not in acute distress. HENT:     Head: Normocephalic and atraumatic.     Nose: Nose normal. No congestion.     Mouth/Throat:     Mouth: Mucous membranes are moist.     Pharynx: Oropharynx is clear.  Eyes:     General: Scleral icterus present.     Extraocular Movements: Extraocular movements intact.  Cardiovascular:     Rate and Rhythm: Normal rate and regular rhythm.     Pulses: Normal pulses.  Pulmonary:     Effort:  Pulmonary effort is normal. No respiratory distress.  Abdominal:     General: Bowel sounds are normal. There is distension.     Palpations: Abdomen is soft. There is no mass.     Tenderness: There is abdominal tenderness (moderate, diffuse). There is no guarding or rebound.     Hernia: No hernia is present.     Comments: Rectal sheath hematoma with ecchymosis  Musculoskeletal:        General: No swelling or tenderness.     Cervical back: Normal range of motion and neck supple.  Skin:    General: Skin is warm and dry.     Coloration: Skin is jaundiced.  Neurological:     General: No focal deficit present.     Mental Status: She is oriented to person, place, and time. She is lethargic.  Psychiatric:        Mood and Affect: Mood normal.        Behavior: Behavior normal. Behavior is cooperative.      GI:  Lab Results: Recent Labs    04/20/20 2351  WBC 13.0*  HGB 6.6*  HCT 20.0*  PLT 47*   BMET Recent Labs    04/20/20 2351  NA 133*  K 2.8*  CL 111  CO2 17*  GLUCOSE 87  BUN 14  CREATININE 1.18*  CALCIUM 8.5*   LFT Recent Labs    04/20/20 2351  PROT 4.0*  ALBUMIN 1.7*  AST 66*  ALT 33  ALKPHOS 170*  BILITOT 12.3*   PT/INR Recent Labs    04/21/20 0028  LABPROT 36.6*  INR 3.8*     Studies/Results: CT ABDOMEN PELVIS W CONTRAST  Result Date: 04/21/2020 CLINICAL DATA:  Nausea, vomiting, abdominal distension EXAM: CT ABDOMEN AND PELVIS WITH CONTRAST TECHNIQUE: Multidetector CT imaging of the abdomen and pelvis was performed using the standard protocol following bolus administration of intravenous contrast. CONTRAST:  126m OMNIPAQUE IOHEXOL 300 MG/ML  SOLN COMPARISON:  06/25/2018 FINDINGS: Lower chest: Small to moderate left pleural effusion has developed, incompletely evaluated, with compressive atelectasis of the left lower lobe. Mild atelectasis or infiltrate within the visualized right middle lobe. Extensive right coronary artery calcification. Global  cardiac size within normal limits. Hepatobiliary: Progressive changes of cirrhosis with increasing nodularity of the liver surface, decreasing overall liver volume, enlargement of portosystemic vascular collaterals, and development  of moderate ascites. Multiple hepatic cysts are again identified, better assessed on sonogram of 02/21/2020. No enhancing liver mass identified. No intra or extrahepatic biliary ductal dilation. Cholelithiasis noted without definite CT evidence of acute cholecystitis. Pancreas: Unremarkable Spleen: Mild splenomegaly is again identified, stable since prior examination. No intrasplenic mass identified. The splenic vein is patent. Adrenals/Urinary Tract: The adrenal glands are unremarkable. The kidneys are normal in size and position. Multiple simple cortical cysts are seen bilaterally. 3 mm nonobstructing calculus is seen within the lower pole of the left kidney. No hydronephrosis. No ureteral calculi. The bladder is unremarkable. Stomach/Bowel: The stomach and small bowel are unremarkable. There is marked circumferential wall thickening involving the cecum and ascending colon which may reflect changes of portal colopathy, though a superimposed infectious or inflammatory ascending colitis could appear similarly. No evidence of obstruction. No free intraperitoneal gas. Moderate ascites. Vascular/Lymphatic: Progressive dilation of the a portal vein likely reflective of increasing portal venous pressure. Prominent gastroesophageal varices are again identified. Recanalization of the umbilical vein again noted. Since the prior examination, embolization coils are identified within the inferior epigastric artery as well as high attenuation intraluminal material likely representing residual glue or Onyx. There is moderate aortoiliac atherosclerotic calcification. No aortic aneurysm. No pathologic adenopathy within the abdomen and pelvis. Reproductive: Multiple enhancing nodules within the uterus  likely representing multiple underlying uterine fibroids. The pelvic organs are otherwise unremarkable. Other: There is enlargement of the right rectus sheath in keeping with the given history of a rectus hematoma. There is moderate diffuse body wall subcutaneous edema. Tiny fat containing umbilical hernia. Musculoskeletal: Subacute fractures of the right superior and inferior pubic rami and right sacral ala are identified. Remote healed fracture of the left inferior pubic ramus noted. Severe T11 compression fracture has developed, age indeterminate, with near vertebral plana deformity and mild retropulsion of the superior posterior aspect of the T11 vertebral body resulting in moderate central canal stenosis. IMPRESSION: Enlargement of the a right rectus sheath in keeping with a rectus hematoma. Embolization of the right inferior epigastric artery. No evidence of active extravasation. Progressive changes of cirrhosis and portal venous hypertension with enlarging portal venous and portosystemic collateral vasculature, progressive volume loss of the liver, and development of moderate ascites. Interval development of marked circumferential wall thickening of the cecum and ascending colon suggestive of a portal colopathy, though infectious or inflammatory ascending colitis could appear similarly. Interval development of moderate anasarca with left pleural effusion, diffuse body wall subcutaneous edema and ascites. Severe wedge compression deformity of T11 with mild retropulsion, age indeterminate. This was not seen on prior CT examination of the chest of 10/17/2019. Correlation for point tenderness is recommended. This could be better assessed with MRI examination, if indicated. Additional subacute fractures of the right superior and inferior pubic rami as well as the right sacral ala. Cholelithiasis. Minimal left nephrolithiasis. Aortic Atherosclerosis (ICD10-I70.0). Electronically Signed   By: Fidela Salisbury MD   On:  04/21/2020 03:33    Impression: Decompensated cirrhosis, alcohol-related, MELD Na of 33 (52.6% estimated 45-monthmortality) -INR 3.8 today -Platelets 47 K/uL -T. Bili 12.3/ AST 66/ ALT 33/ ALP 170 -CT 04/21/20 with progression of cirrhosis, portal HTN, and moderate ascites.  Probably portal colopathy. -Cipro (SBP prophylaxis) on hold due to QT prolongation  Rectus sheath hematoma s/p IR embolization  Abdominal pain, likely due to hematoma  CKD  Plan: Agree with FFP/cryo to treat coagulopathy.  Continue supportive care.  Continue to monitor H&H with transfusion as needed to  maintain Hgb >7.  Abdomen mildly distended but soft.  Do not recommend paracentesis at this time, especially in the setting of coagulopathy.  Continue lactulose, titrated for 2-3 soft BMs/day.  Recommend ceftriaxone IV for SBP prophylaxis (1g daily).  Recommend palliative care consultation.  Prognosis guarded.  [facility] will follow.   LOS: 0 days   Salley Slaughter  PA-C 04/21/2020, 8:35 AM  Contact #  [phone]

## 2020-04-21 NOTE — H&P (Signed)
History and Physical    Dana Watkins VOJ:[id number] DOB: 1955/03/19 DOA: 04/20/2020  PCP: Lawerance Cruel, MD  Patient coming from: Home   Chief Complaint:  Chief Complaint  Patient presents with  . Vomiting     HPI:    65 year old female with past medical history of of alcoholic cirrhosis (complicated by hepatic encephalopathy and chronic thrombocytopenia), chronic kidney disease stage IIIa, alcohol abuse, coronary artery disease (nonobstrucive mulivessel disease via cath 09/9369), systolic and diastolic congestive heart failure (EF 40-45% with G1DD) and recent complication of rectus sheath hematoma who presents to [facility] emergency department with abdominal pain and vomiting.  Patient is a rather poor historian and much of the history is been obtained from the husband who is at the bedside in addition to review of emergency department notes and discussion with emergency department staff.  Of note, patient was recently admitted to [facility] from 4/5 until 4/8.  Patient was sent to the [facility] emergency room by transplant clinic due to severe hypokalemia.  Upon evaluation in the emergency department, patient was found to have abdominal pain and CT imaging revealed a rectus sheath hematoma.  Patient was transfused 2 units of packed red blood cells, given cryoprecipitate and vitamin K and interventional radiology performed an embolization in an effort to stem the bleeding.  Bleed was felt to have stabilized and patient was eventually discharged on 4/8 with Hgb of 7.9.  Patient explains that since her discharge she has had near constant abdominal pain.  Patient describes the abdominal pain as diffuse but worse along the left side of the abdomen.  Pain is "tight" in quality and severe in intensity, worse with movement and improved with rest.  Pain is associated with frequent bouts of nausea, found some nonbilious nonbloody vomiting and inability to tolerate  oral intake.  Due to progressively worsening symptoms patient presented to [facility] emergency department.  Upon evaluation in the emergency department patient was found to have progressive anemia with hemoglobin of 6.6.  Patient was found to have worsening coagulopathy with INR 3.8 patient was also found to have worsening thrombocytopenia with platelet count of 47.  Patient was also found to be hypokalemic with potassium of 2.8.  Potassium replacement was initiated and packed red blood cell transfusion was ordered and the hospitalist group was then called to assess the patient for admission to the hospital.  The emergency department provider did contact gastroenterology via epic secure chat stated that gastroenterology would evaluate the patient later this morning.  Review of Systems:   Review of Systems  Gastrointestinal: Positive for abdominal pain, nausea and vomiting.  Neurological: Positive for weakness.    Past Medical History:  Diagnosis Date  . Acute hypokalemia 11/26/2014  . Arthritis 05/15/2015  . Benign essential hypertension 05/15/2015  . C. difficile colitis   . CAD (coronary artery disease)    a. cath 04/2017: ""Diffuse, calcific CAD particularly in the distal RCA and proximal to mid LAD.  LAD disease is nonobstructive.  RCA disease is more severe but does not appear significant.  Given her lack of symptoms, would pursue medical therapy."  . CKD (chronic kidney disease), stage III ([place])   . Hypokalemia   . Migraine headache 05/15/2015  . Mild mitral regurgitation   . Pancytopenia ([place])   . Sepsis ([place]) 01/2017  . Thrombocytopenia ([place])    a. chronic thrombocytopenia (ITP - remotely saw hematology).  . Vitamin D deficiency     Past  Surgical History:  Procedure Laterality Date  . CESAREAN SECTION     24 years ago  . ESOPHAGOGASTRODUODENOSCOPY (EGD) WITH PROPOFOL N/A 06/13/2019   Procedure: ESOPHAGOGASTRODUODENOSCOPY (EGD) WITH PROPOFOL;  Surgeon: Arta Silence,  MD;  Location: [facility] ENDOSCOPY;  Service: Endoscopy;  Laterality: N/A;  administration of one bag of platelets   . LEFT HEART CATH AND CORONARY ANGIOGRAPHY N/A 05/12/2017   Procedure: LEFT HEART CATH AND CORONARY ANGIOGRAPHY;  Surgeon: [name] Booze, MD;  Location: [place] CV LAB;  Service: Cardiovascular;  Laterality: N/A;  . THORACENTESIS N/A 08/28/2019   Procedure: Mathews Robinsons;  Surgeon: Candee Furbish, MD;  Location: [facility] ENDOSCOPY;  Service: Pulmonary;  Laterality: N/A;  . tonsils and adneoids     as a child  . ULTRASOUND GUIDANCE FOR VASCULAR ACCESS  05/12/2017   Procedure: Ultrasound Guidance For Vascular Access;  Surgeon: [name] Booze, MD;  Location: [place] CV LAB;  Service: Cardiovascular;;     reports that she quit smoking about 5 years ago. She started smoking about 51 years ago. She quit after 2.00 years of use. She has never used smokeless tobacco. She reports current alcohol use. She reports that she does not use drugs.  Allergies  Allergen Reactions  . Sulfa Antibiotics Nausea And Vomiting, Rash and Shortness Of Breath    Patient reports reaction of mild and took place a very long time ago.    Family History  Problem Relation Age of Onset  . Pernicious anemia Maternal Grandfather   . Heart attack Maternal Grandfather   . Atrial fibrillation Mother   . Supraventricular tachycardia Father   . Heart disease Paternal Grandfather      Prior to Admission medications   Medication Sig Start Date End Date Taking? Authorizing Provider  ciprofloxacin (CIPRO) 500 MG tablet Take 500 mg by mouth at bedtime. 04/18/20  Yes [provider]  Ascorbic Acid (VITAMIN C) 1000 MG tablet Take 1,000 mg by mouth in the morning and at bedtime. Chewable    [provider]  B Complex Vitamins (B COMPLEX PO) Take by mouth.    [provider]  carvedilol (COREG) 3.125 MG tablet Take 1 tablet (3.125 mg total) by mouth 2 (two) times daily with a  meal. Patient not taking: Reported on 04/20/2020 12/25/19   [name], MD  FLUoxetine (PROZAC) 20 MG capsule Take 60 mg by mouth every morning. 04/16/20   [provider]  furosemide (LASIX) 40 MG tablet Take 40 mg by mouth daily. 04/09/20   [provider]  haloperidol (HALDOL) 5 MG tablet Take by mouth. 03/21/20   [provider]  hydrOXYzine (ATARAX/VISTARIL) 10 MG tablet TAKE 1 TABLET BY MOUTH EVERY 4HRS AS NEEDED FOR ITCHING 04/01/20   [provider]  LORazepam (ATIVAN) 1 MG tablet Take by mouth. 03/21/20   [provider]  MAGNESIUM PO Take 400 mg by mouth daily.     [provider]  Multiple Vitamin (MULTIVITAMIN WITH MINERALS) TABS tablet Take 1 tablet by mouth daily. 06/29/18   Raiford Noble Latif, DO  potassium chloride SA (KLOR-CON) 20 MEQ tablet Take 1 tablet (20 mEq total) by mouth 2 (two) times daily. Patient not taking: Reported on 04/20/2020 08/14/19   Dixie Dials, MD  spironolactone (ALDACTONE) 25 MG tablet Take 25 mg by mouth daily. Patient not taking: Reported on 04/20/2020    [provider]    Physical Exam: Vitals:   04/21/20 1610 04/21/20 0558 04/21/20 0700 04/21/20 0710  BP: 107/63 118/66  119/65  Pulse: 97 97 96 93  Resp: (!) 22 18 19 16   Temp: 98.4 F (36.9 C) 98.4 F (36.9 C) 98.2 F (36.8 C)   TempSrc: Oral Oral Oral   SpO2: 97% 97% 99% 98%  Weight:   80.1 kg   Height:   5' 8"  (1.727 m)     Constitutional: Patient is a lethargic but arousable and oriented x3.  Patient is currently in acute distress due to abdominal pain.    Skin: Patient is profoundly jaundiced with notable large ecchymosis along the left flank and anterior abdominal wall.  Relatively poor skin turgor noted. Eyes: Pupils are equally reactive to light.  Increased conjunctival pallor and increased scleral icterus noted.   ENMT: Moist mucous membranes noted.  Posterior pharynx clear of any exudate or lesions.   Neck: normal,  supple, no masses, no thyromegaly.  No evidence of jugular venous distension.   Respiratory: clear to auscultation bilaterally, no wheezing, no crackles. Normal respiratory effort. No accessory muscle use.  Cardiovascular: Tachycardic rate with regular rhythm, no murmurs / rubs / gallops. No extremity edema. 2+ pedal pulses. No carotid bruits.  Chest:   Nontender without crepitus or deformity.   Back:   Nontender without crepitus or deformity. Abdomen: Abdomen is diffusely tender, worst along the left side.  Abdomen is also quite distended and somewhat firm to the touch.  Notable fullness along the left abdomen.  Positive bowel sounds noted in all quadrants.   Musculoskeletal: No joint deformity upper and lower extremities. Good ROM, no contractures. Normal muscle tone.  Neurologic: Patient is quite lethargic but arousable.  CN 2-12 grossly intact. Sensation intact.  Patient moving all 4 extremities spontaneously.  Patient is following all commands.  Patient is responsive to verbal stimuli.   Psychiatric: Depressed mood with flat affect.  I do not believe that the patient currently possesses insight as to her current situation.     Labs on Admission: I have personally reviewed following labs and imaging studies -   CBC: Recent Labs  Lab 04/20/20 2351  WBC 13.0*  NEUTROABS 10.3*  HGB 6.6*  HCT 20.0*  MCV 108.1*  PLT 47*   Basic Metabolic Panel: Recent Labs  Lab 04/20/20 2351  NA 133*  K 2.8*  CL 111  CO2 17*  GLUCOSE 87  BUN 14  CREATININE 1.18*  CALCIUM 8.5*  MG 1.6*   GFR: Estimated Creatinine Clearance: 53.5 mL/min (A) (by C-G formula based on SCr of 1.18 mg/dL (H)). Liver Function Tests: Recent Labs  Lab 04/20/20 2351  AST 66*  ALT 33  ALKPHOS 170*  BILITOT 12.3*  PROT 4.0*  ALBUMIN 1.7*   Recent Labs  Lab 04/20/20 2351  LIPASE 55*   No results for input(s): AMMONIA in the last 168 hours. Coagulation Profile: Recent Labs  Lab 04/21/20 0028  INR 3.8*    Cardiac Enzymes: No results for input(s): CKTOTAL, CKMB, CKMBINDEX, TROPONINI in the last 168 hours. BNP (last 3 results) Recent Labs    08/23/19 1030  PROBNP 190.0*   HbA1C: No results for input(s): HGBA1C in the last 72 hours. CBG: No results for input(s): GLUCAP in the last 168 hours. Lipid Profile: No results for input(s): CHOL, HDL, LDLCALC, TRIG, CHOLHDL, LDLDIRECT in the last 72 hours. Thyroid Function Tests: No results for input(s): TSH, T4TOTAL, FREET4, T3FREE, THYROIDAB in the last 72 hours. Anemia Panel: No results for input(s): VITAMINB12, FOLATE, FERRITIN, TIBC, IRON, RETICCTPCT in the last 72  hours. Urine analysis:    Component Value Date/Time   COLORURINE AMBER (A) 04/20/2020 2351   APPEARANCEUR HAZY (A) 04/20/2020 2351   LABSPEC 1.018 04/20/2020 2351   PHURINE 5.0 04/20/2020 2351   GLUCOSEU 150 (A) 04/20/2020 2351   HGBUR NEGATIVE 04/20/2020 2351   BILIRUBINUR NEGATIVE 04/20/2020 2351   KETONESUR 5 (A) 04/20/2020 2351   PROTEINUR 30 (A) 04/20/2020 2351   NITRITE NEGATIVE 04/20/2020 2351   LEUKOCYTESUR NEGATIVE 04/20/2020 2351    Radiological Exams on Admission - Personally Reviewed: CT ABDOMEN PELVIS W CONTRAST  Result Date: 04/21/2020 CLINICAL DATA:  Nausea, vomiting, abdominal distension EXAM: CT ABDOMEN AND PELVIS WITH CONTRAST TECHNIQUE: Multidetector CT imaging of the abdomen and pelvis was performed using the standard protocol following bolus administration of intravenous contrast. CONTRAST:  166m OMNIPAQUE IOHEXOL 300 MG/ML  SOLN COMPARISON:  06/25/2018 FINDINGS: Lower chest: Small to moderate left pleural effusion has developed, incompletely evaluated, with compressive atelectasis of the left lower lobe. Mild atelectasis or infiltrate within the visualized right middle lobe. Extensive right coronary artery calcification. Global cardiac size within normal limits. Hepatobiliary: Progressive changes of cirrhosis with increasing nodularity of the liver  surface, decreasing overall liver volume, enlargement of portosystemic vascular collaterals, and development of moderate ascites. Multiple hepatic cysts are again identified, better assessed on sonogram of 02/21/2020. No enhancing liver mass identified. No intra or extrahepatic biliary ductal dilation. Cholelithiasis noted without definite CT evidence of acute cholecystitis. Pancreas: Unremarkable Spleen: Mild splenomegaly is again identified, stable since prior examination. No intrasplenic mass identified. The splenic vein is patent. Adrenals/Urinary Tract: The adrenal glands are unremarkable. The kidneys are normal in size and position. Multiple simple cortical cysts are seen bilaterally. 3 mm nonobstructing calculus is seen within the lower pole of the left kidney. No hydronephrosis. No ureteral calculi. The bladder is unremarkable. Stomach/Bowel: The stomach and small bowel are unremarkable. There is marked circumferential wall thickening involving the cecum and ascending colon which may reflect changes of portal colopathy, though a superimposed infectious or inflammatory ascending colitis could appear similarly. No evidence of obstruction. No free intraperitoneal gas. Moderate ascites. Vascular/Lymphatic: Progressive dilation of the a portal vein likely reflective of increasing portal venous pressure. Prominent gastroesophageal varices are again identified. Recanalization of the umbilical vein again noted. Since the prior examination, embolization coils are identified within the inferior epigastric artery as well as high attenuation intraluminal material likely representing residual glue or Onyx. There is moderate aortoiliac atherosclerotic calcification. No aortic aneurysm. No pathologic adenopathy within the abdomen and pelvis. Reproductive: Multiple enhancing nodules within the uterus likely representing multiple underlying uterine fibroids. The pelvic organs are otherwise unremarkable. Other: There is  enlargement of the right rectus sheath in keeping with the given history of a rectus hematoma. There is moderate diffuse body wall subcutaneous edema. Tiny fat containing umbilical hernia. Musculoskeletal: Subacute fractures of the right superior and inferior pubic rami and right sacral ala are identified. Remote healed fracture of the left inferior pubic ramus noted. Severe T11 compression fracture has developed, age indeterminate, with near vertebral plana deformity and mild retropulsion of the superior posterior aspect of the T11 vertebral body resulting in moderate central canal stenosis. IMPRESSION: Enlargement of the a right rectus sheath in keeping with a rectus hematoma. Embolization of the right inferior epigastric artery. No evidence of active extravasation. Progressive changes of cirrhosis and portal venous hypertension with enlarging portal venous and portosystemic collateral vasculature, progressive volume loss of the liver, and development of moderate ascites. Interval development of  marked circumferential wall thickening of the cecum and ascending colon suggestive of a portal colopathy, though infectious or inflammatory ascending colitis could appear similarly. Interval development of moderate anasarca with left pleural effusion, diffuse body wall subcutaneous edema and ascites. Severe wedge compression deformity of T11 with mild retropulsion, age indeterminate. This was not seen on prior CT examination of the chest of 10/17/2019. Correlation for point tenderness is recommended. This could be better assessed with MRI examination, if indicated. Additional subacute fractures of the right superior and inferior pubic rami as well as the right sacral ala. Cholelithiasis. Minimal left nephrolithiasis. Aortic Atherosclerosis (ICD10-I70.0). Electronically Signed   By: Fidela Salisbury MD   On: 04/21/2020 03:33    EKG: Personally reviewed.  Rhythm is normal sinus rhythm with heart rate of per minute.  Notable  prolonged QTc of 526.    Assessment/Plan Principal Problem:   Acute blood loss anemia secondary to rectus sheath hematoma in the setting of coagulopathy and thrombocytopenia   CT imaging of the abdomen and pelvis reveals enlarging rectus sheath hematoma despite embolization at [facility] last week.  This is associated with a substantial drop in hemoglobin, now at 6.6, down from 7.8 from discharge from [facility]  4/8 and down from 9.2 several weeks prior.  This is likely exacerbated by worsening coagulopathy due to severe liver disease, currently with an INR 3.8 when INR was 2.6 one month ago.  Considering recent embolization I feel the most appropriate way to try to stem the bleeding is to correct the coagulopathy.  Fibrinogen is found to be profoundly low at less than 60.  I am ordering 2 pools of cryoprecipitate, approximated off patient's body weight.  This will be in addition to providing patient with a pack of platelets and a unit of blood.  Vitamin K was being provided as well although I doubt this will help much considering patient's advanced liver disease.  Placing patient in stepdown unit for extremely close clinical monitoring  Performing serial CBCs, fibrinogen levels and coagulation profiles  Case discussed with gastroenterology who graciously will evaluate the patient, their recommendations are appreciated.  I discussed the CT images with Dr. Markus Daft with interventional radiology.  At this point he does not feel that the current hematoma is expansive enough to intervene especially after the recent intervention.  He agrees with attempts at correcting the coagulopathy and if bleeding continues despite that he would recommend repeating a CT angiogram of the abdomen to identify if intervention is more appropriate at that time.  Active Problems:    Hypokalemia   Recurrent severe hypokalemia  Replacing via both oral and intravenous potassium chloride segmentation  Resuming home regimen  of spironolactone 100 mg daily as blood pressure tolerates  Monitoring potassium levels with serial chemistries    Coronary artery disease involving native coronary artery of native heart without angina pectoris   Chest pain-free  Monitoring patient on telemetry  No antiplatelet therapy due to coagulopathy    Hepatic encephalopathy (HCC)   Resuming lactulose 30 g p.o. 3 times daily as long as patient is able to tolerate oral intake    Alcoholic cirrhosis of liver with ascites ([place])   Advanced disease with numerous complications including hepatic encephalopathy, chronic thrombocytopenia and substantial coagulopathy  Meld score noted to be 33,  suggestive of a 52.6% three-month mortality.  Remainder of management as noted above  Patient attempting to participate in [facility] transplant clinic however I am unsure as to whether patient will be a candidate  with patient's relatively recent alcohol use.    Chronic kidney disease, stage 3a ([place])  . Strict intake and output monitoring . Creatinine near baseline . Minimizing nephrotoxic agents as much as possible . Serial chemistries to monitor renal function and electrolytes    History of alcohol abuse   Patient reports that she has not been active the drinking since "the holidays."  Continuing to counsel patient daily on need to remain abstinent of alcohol    Chronic combined systolic (congestive) and diastolic (congestive) heart failure ([place])   Most of patient's anasarca/volume overload is secondary to low oncotic pressure from liver disease and not due to acute heart failure  Resuming previously prescribed regimen of spironolactone 100 mg daily as blood pressure tolerates    AF (paroxysmal atrial fibrillation) (HCC)   No need for anticoagulation due to underlying coagulopathy  Will initiate as needed rate control agents.    Prolonged QT interval   Markedly prolonged QT interval on admission EKG, likely exacerbated by  electrolyte abnormalities  Temporarily holding patient's usual regimen of daily ciprofloxacin for SBP prophylaxis and psychotropic agents  Monitoring patient on telemetry  Minimizing further use of QT prolonging agents   Code Status:  DNR Family Communication: Husband is at bedside who has been updated on plan of care  Status is: Inpatient  Remains inpatient appropriate because:Ongoing diagnostic testing needed not appropriate for outpatient work up, IV treatments appropriate due to intensity of illness or inability to take PO and Inpatient level of care appropriate due to severity of illness   Dispo: The patient is from: Home              Anticipated d/c is to: Home              Patient currently is not medically stable to d/c.   Difficult to place patient No        Vernelle Emerald MD [facility] Pager [phone]  If 7PM-7AM, please contact night-coverage [email or web address] Use universal [place] password for that web site. If you do not have the password, please call the hospital operator.  04/21/2020, 7:44 AM

## 2020-04-22 DIAGNOSIS — R531 Weakness: Secondary | ICD-10-CM

## 2020-04-22 DIAGNOSIS — Z515 Encounter for palliative care: Secondary | ICD-10-CM

## 2020-04-22 DIAGNOSIS — Z7189 Other specified counseling: Secondary | ICD-10-CM

## 2020-04-22 LAB — PREPARE CRYOPRECIPITATE
Unit division: 0
Unit division: 0

## 2020-04-22 LAB — PREPARE PLATELET PHERESIS: Unit division: 0

## 2020-04-22 LAB — COMPREHENSIVE METABOLIC PANEL
ALT: 40 U/L (ref 0–44)
AST: 73 U/L — ABNORMAL HIGH (ref 15–41)
Albumin: 2.2 g/dL — ABNORMAL LOW (ref 3.5–5.0)
Alkaline Phosphatase: 197 U/L — ABNORMAL HIGH (ref 38–126)
Anion gap: 9 (ref 5–15)
BUN: 12 mg/dL (ref 8–23)
CO2: 16 mmol/L — ABNORMAL LOW (ref 22–32)
Calcium: 9.6 mg/dL (ref 8.9–10.3)
Chloride: 113 mmol/L — ABNORMAL HIGH (ref 98–111)
Creatinine, Ser: 1.11 mg/dL — ABNORMAL HIGH (ref 0.44–1.00)
GFR, Estimated: 56 mL/min — ABNORMAL LOW (ref >60)
Glucose, Bld: 105 mg/dL — ABNORMAL HIGH (ref 70–99)
Potassium: 3.7 mmol/L (ref 3.5–5.1)
Sodium: 138 mmol/L (ref 135–145)
Total Bilirubin: 13.6 mg/dL — ABNORMAL HIGH (ref 0.3–1.2)
Total Protein: 5 g/dL — ABNORMAL LOW (ref 6.5–8.1)

## 2020-04-22 LAB — CBC
HCT: 24.4 % — ABNORMAL LOW (ref 36.0–46.0)
Hemoglobin: 7.8 g/dL — ABNORMAL LOW (ref 12.0–15.0)
MCH: 34.2 pg — ABNORMAL HIGH (ref 26.0–34.0)
MCHC: 32 g/dL (ref 30.0–36.0)
MCV: 107 fL — ABNORMAL HIGH (ref 80.0–100.0)
Platelets: 71 10*3/uL — ABNORMAL LOW (ref 150–400)
RBC: 2.28 MIL/uL — ABNORMAL LOW (ref 3.87–5.11)
RDW: 25.2 % — ABNORMAL HIGH (ref 11.5–15.5)
WBC: 9.3 10*3/uL (ref 4.0–10.5)
nRBC: 0 % (ref 0.0–0.2)

## 2020-04-22 LAB — BPAM PLATELET PHERESIS
Blood Product Expiration Date: 202204142359
ISSUE DATE / TIME: 202204111122
Unit Type and Rh: 5100

## 2020-04-22 LAB — BPAM CRYOPRECIPITATE
Blood Product Expiration Date: 202204111330
Blood Product Expiration Date: 202204111330
ISSUE DATE / TIME: 202204110848
ISSUE DATE / TIME: 202204111005
Unit Type and Rh: 5100
Unit Type and Rh: 5100

## 2020-04-22 LAB — PROTIME-INR
INR: 2.3 — ABNORMAL HIGH (ref 0.8–1.2)
Prothrombin Time: 24.6 seconds — ABNORMAL HIGH (ref 11.4–15.2)

## 2020-04-22 MED ORDER — ALPRAZOLAM 0.25 MG PO TABS
0.2500 mg | ORAL_TABLET | Freq: Once | ORAL | Status: AC
  Administered 2020-04-22: 0.25 mg via ORAL
  Filled 2020-04-22: qty 1

## 2020-04-22 MED ORDER — LACTULOSE 10 GM/15ML PO SOLN
30.0000 g | Freq: Two times a day (BID) | ORAL | Status: DC
  Administered 2020-04-22 – 2020-04-25 (×6): 30 g via ORAL
  Filled 2020-04-22 (×6): qty 45

## 2020-04-22 MED ORDER — ALUM & MAG HYDROXIDE-SIMETH 200-200-20 MG/5ML PO SUSP
30.0000 mL | Freq: Four times a day (QID) | ORAL | Status: DC | PRN
  Administered 2020-04-22 – 2020-04-26 (×7): 30 mL via ORAL
  Filled 2020-04-22 (×8): qty 30

## 2020-04-22 NOTE — Patient Care Conference (Signed)
Called and updated patient's son over the phone. All questions were answered.

## 2020-04-22 NOTE — Consult Note (Signed)
Consultation Note Date: 04/22/2020   Patient Name: Dana Watkins  DOB: 12/25/1955  MRN: [id number]  Age / Sex: 65 y.o., female  PCP: Lawerance Cruel, MD Referring Physician: Donne Hazel, MD  Reason for Consultation: Establishing goals of care  HPI/Patient Profile: 65 y.o. female  admitted on 04/20/2020    Clinical Assessment and Goals of Care:  65 yo lady known to Palliative medicine service at [facility], seen in in patient consultation in a previous hospitalization, as well as seen in outpatient palliative consult at the cancer center, admitted with decompensated cirrhosis, has high MELD score, has rectus sheath hematoma s/p IR embolization, also with anemia s/p PRBC. GI following, on hospital medicine service. Palliative consult for goals of care has been requested.   Patient is awake alert, in good spirits, resting comfortably, how ever, does complain of abdominal pain, she ate well for breakfast. Son Dana Watkins who is from [place] is at bedside. I introduced myself and palliative care as follows: Palliative medicine is specialized medical care for people living with serious illness. It focuses on providing relief from the symptoms and stress of a serious illness. The goal is to improve quality of life for both the patient and the family.  Goals of care: Broad aims of medical therapy in relation to the patient's values and preferences. Our aim is to provide medical care aimed at enabling patients to achieve the goals that matter most to them, given the circumstances of their particular medical situation and their constraints.   Discussed with patient about her current condition and current treatment plan, also discussed with her about how hospice/palliative services can help going forward, she was recently enrolled in  Hospice, she is to be enrolled in palliative services through [facility], see below, thank  you for the consult.   NEXT OF KIN  son Dana Watkins who lives in [place] is at bedside currently.  Also has a daughter.   SUMMARY OF RECOMMENDATIONS    Agree with DNR Continue current mode of care Recommend home based palliative care ( patient already familiar with [facility] services) upon discharge and follow up with [facility] hepatology.  Continue current pain and non pain management options.  Thank you for the consult.   Code Status/Advance Care Planning:  DNR    Symptom Management:      Palliative Prophylaxis:   Delirium Protocol   Psycho-social/Spiritual:   Desire for further Chaplaincy support:yes  Additional Recommendations: Education on Hospice  Prognosis:   Unable to determine  Discharge Planning: Home with Palliative Services      Primary Diagnoses: Present on Admission: . Acute blood loss anemia . Rectus sheath hematoma . Thrombocytopenia ([place]) . Alcoholic cirrhosis of liver with ascites ([place]) . Coronary artery disease involving native coronary artery of native heart without angina pectoris . Chronic kidney disease, stage 3a ([place]) . History of alcohol abuse . Coagulopathy ([place]) . Hypomagnesemia . Hypokalemia . Chronic combined systolic (congestive) and diastolic (congestive) heart failure ([place]) . AF (paroxysmal atrial fibrillation) ([place]) . Hepatic encephalopathy ([place]) .  Prolonged QT interval   I have reviewed the medical record, interviewed the patient and family, and examined the patient. The following aspects are pertinent.  Past Medical History:  Diagnosis Date  . Acute hypokalemia 11/26/2014  . Arthritis 05/15/2015  . Benign essential hypertension 05/15/2015  . C. difficile colitis   . CAD (coronary artery disease)    a. cath 04/2017: ""Diffuse, calcific CAD particularly in the distal RCA and proximal to mid LAD.  LAD disease is nonobstructive.  RCA disease is more severe but does not appear significant.  Given her lack of symptoms, would pursue  medical therapy."  . CKD (chronic kidney disease), stage III ([place])   . Hypokalemia   . Migraine headache 05/15/2015  . Mild mitral regurgitation   . Pancytopenia ([place])   . Sepsis ([place]) 01/2017  . Thrombocytopenia ([place])    a. chronic thrombocytopenia ([facility] - remotely saw hematology).  . Vitamin D deficiency    Social History   Socioeconomic History  . Marital status: Divorced    Spouse name: Not on file  . Number of children: Not on file  . Years of education: Not on file  . Highest education level: Not on file  Occupational History  . Not on file  Tobacco Use  . Smoking status: Former Smoker    Years: 2.00    Start date: 06/25/1968    Quit date: 05/16/2014    Years since quitting: 5.9  . Smokeless tobacco: Never Used  Vaping Use  . Vaping Use: Never used  Substance and Sexual Activity  . Alcohol use: Yes    Comment: occ  . Drug use: No  . Sexual activity: Not on file  Other Topics Concern  . Not on file  Social History Narrative   Lives with husband  In home.  Has seasonal allergies.  Drinks coffee, 1 cup AM and 3 plus sweet tea.  Has 3 grown kids.     Social Determinants of Health   Financial Resource Strain: Not on file  Food Insecurity: Not on file  Transportation Needs: Not on file  Physical Activity: Not on file  Stress: Not on file  Social Connections: Not on file   Family History  Problem Relation Age of Onset  . Pernicious anemia Maternal Grandfather   . Heart attack Maternal Grandfather   . Atrial fibrillation Mother   . Supraventricular tachycardia Father   . Heart disease Paternal Grandfather    Scheduled Meds: . sodium chloride   Intravenous Once  . Chlorhexidine Gluconate Cloth  6 each Topical Daily  . lactulose  30 g Oral BID  . mouth rinse  15 mL Mouth Rinse BID  . mupirocin ointment  1 application Nasal BID  . phytonadione  10 mg Oral Daily  . spironolactone  100 mg Oral Daily   Continuous Infusions: . cefTRIAXone (ROCEPHIN)  IV Stopped  (04/22/20 1203)   PRN Meds:.alum & mag hydroxide-simeth, hydrOXYzine, morphine injection, oxyCODONE Medications Prior to Admission:  Prior to Admission medications   Medication Sig Start Date End Date Taking? Authorizing Provider  ciprofloxacin (CIPRO) 500 MG tablet Take 500 mg by mouth at bedtime. 04/18/20  Yes [provider]  FLUoxetine (PROZAC) 20 MG capsule Take 60 mg by mouth every morning. 04/16/20  Yes [provider]  ibuprofen (ADVIL) 200 MG tablet Take 200 mg by mouth every 6 (six) hours as needed for fever, mild pain or headache.   Yes [provider]  lactulose (CHRONULAC) 10 GM/15ML solution Take  15-30 mLs by mouth 3 (three) times daily as needed for constipation. 03/21/20  Yes [provider]  Morphine Sulfate (MORPHINE CONCENTRATE) 10 mg / 0.5 ml concentrated solution Take 10 mg by mouth every 2 (two) hours as needed for pain. 04/08/20  Yes [provider]  ondansetron (ZOFRAN-ODT) 4 MG disintegrating tablet Take 4 mg by mouth 3 (three) times daily as needed for nausea/vomiting. 03/06/20  Yes [provider]  polyvinyl alcohol (LIQUIFILM TEARS) 1.4 % ophthalmic solution Place 1 drop into both eyes as needed for dry eyes.   Yes [provider]  spironolactone (ALDACTONE) 100 MG tablet Take 100 mg by mouth daily. 04/18/20  Yes [provider]  zinc sulfate 220 (50 Zn) MG capsule Take 220 mg by mouth daily. 04/19/20 04/19/21 Yes [provider]  carvedilol (COREG) 3.125 MG tablet Take 1 tablet (3.125 mg total) by mouth 2 (two) times daily with a meal. Patient not taking: No sig reported 12/25/19   [name], MD  Multiple Vitamin (MULTIVITAMIN WITH MINERALS) TABS tablet Take 1 tablet by mouth daily. Patient not taking: Reported on 04/21/2020 06/29/18   Raiford Noble Latif, DO  potassium chloride SA (KLOR-CON) 20 MEQ tablet Take 1 tablet (20 mEq total) by mouth 2 (two) times daily. Patient not taking: No sig  reported 08/14/19   Dixie Dials, MD   Allergies  Allergen Reactions  . Sulfa Antibiotics Nausea And Vomiting, Rash and Shortness Of Breath    Patient reports reaction of mild and took place a very long time ago.   Review of Systems Abd pain.   Physical Exam Appears weak, awake alert, responds appropriately Has scleral icterus Diffuse abd generalized tenderness No edema No focal deficits.   Vital Signs: BP (!) 116/51 (BP Location: Right Arm)   Pulse 99   Temp 98.6 F (37 C) (Oral)   Resp 18   Ht 5' 8"  (1.727 m)   Wt 80.1 kg   SpO2 96%   BMI 26.85 kg/m  Pain Scale: 0-10   Pain Score: 3    SpO2: SpO2: 96 % O2 Device:SpO2: 96 % O2 Flow Rate: .   IO: Intake/output summary:   Intake/Output Summary (Last 24 hours) at 04/22/2020 1335 Last data filed at 04/22/2020 1200 Gross per 24 hour  Intake 562.93 ml  Output 200 ml  Net 362.93 ml    LBM: Last BM Date: 04/22/20 Baseline Weight: Weight: 80.1 kg Most recent weight: Weight: 80.1 kg     Palliative Assessment/Data:   PPS 40%  Time In:  12 Time Out:  1300 Time Total:  60 min.  Greater than 50%  of this time was spent counseling and coordinating care related to the above assessment and plan.  Signed by: Loistine Chance, MD   Please contact Palliative Medicine Team phone at [phone] for questions and concerns.  For individual provider: See Shea Evans

## 2020-04-22 NOTE — Progress Notes (Signed)
[facility] Progress Note  Dana Watkins 65 y.o. December 26, 1955  CC:  Decompensated cirrhosis  Subjective: Patient reports continued abdominal pain.  Denies nausea/vomiting.  States she is hungry and wants a sandwich.  No melena/hematochezia.  Has been having liquid BMs on lactulose and had an episode of bowel incontinence.  ROS : Review of Systems  Cardiovascular: Negative for chest pain and palpitations.  Gastrointestinal: Positive for abdominal pain and diarrhea. Negative for blood in stool, constipation, heartburn, melena, nausea and vomiting.    Objective: Vital signs in last 24 hours: Vitals:   04/22/20 0600 04/22/20 0800  BP: (!) 101/49   Pulse: 100   Resp: 19   Temp:  99.4 F (37.4 C)  SpO2: 94%     Physical Exam:  General:  Alert, oriented, cooperative, no distress  Head:  Normocephalic, without obvious abnormality, atraumatic  Eyes:  Deep icterus, EOMs intact  Lungs:   Clear to auscultation bilaterally, respirations unlabored  Heart:  Regular rate and rhythm, S1, S2 normal  Abdomen:   Distended but soft, diffuse abdominal tenderness to palpation, ecchymosis on left side of abdomen, bowel sounds active all four quadrants, no peritoneal signs   Extremities: Extremities normal, atraumatic, no  edema  Pulses: 2+ and symmetric    Lab Results: Recent Labs    04/20/20 2351 04/21/20 0948 04/22/20 0217  NA 133* 133* 138  K 2.8* 3.9 3.7  CL 111 110 113*  CO2 17* 17* 16*  GLUCOSE 87 89 105*  BUN 14 16 12   CREATININE 1.18* 0.81 1.11*  CALCIUM 8.5* 9.3 9.6  MG 1.6* 2.2  --    Recent Labs    04/21/20 0948 04/22/20 0217  AST 83* 73*  ALT 39 40  ALKPHOS 194* 197*  BILITOT 14.4* 13.6*  PROT 4.8* 5.0*  ALBUMIN 2.1* 2.2*   Recent Labs    04/20/20 2351 04/21/20 0948 04/21/20 1619 04/21/20 2157 04/22/20 0217  WBC 13.0*   < > 9.3 9.3 9.3  NEUTROABS 10.3*  --  6.6  --   --   HGB 6.6*   < > 7.9* 8.1* 7.8*  HCT 20.0*   < > 24.3* 25.3* 24.4*  MCV  108.1*   < > 105.7* 108.1* 107.0*  PLT 47*   < > 77* 71* 71*   < > = values in this interval not displayed.   Recent Labs    04/21/20 1619 04/22/20 0217  LABPROT 25.3* 24.6*  INR 2.4* 2.3*    Assessment: Decompensated cirrhosis, alcohol-related, MELD Na of 33 as of 04/21/20 (52.6% estimated 37-monthmortality) -INR 2.3 today, improved from 3.8 -Platelets 71K/uL, improved from 47 K/uL after 2u cryo and 1u platelets -T. Bili 13.6/ AST 73/ ALT 40/ ALP 197 -CT 04/21/20 with progression of cirrhosis, portal HTN, and moderate ascites.  Probably portal colopathy. -Cipro (SBP prophylaxis) on hold due to QT prolongation  Rectus sheath hematoma s/p IR embolization  Anemia, likely due to bleeding related to rectal sheath hematoma. No signs of GI blood loss. Hgb 7.8, improved from 6.6 after 1u pRBCs  Abdominal pain, likely due to hematoma  CKD  Plan: Continue supportive care.  Continue to monitor H&H with transfusion as needed to maintain Hgb >7.  Given improvement in alertness and increased liquid stools, decrease lactulose to BID dosing, titrate for 2-3 soft BMs/day.  Recommend ceftriaxone IV for SBP prophylaxis (1g daily).  Soft diet (2g sodium restricted) OK from a GI standpoint.  [facility] will follow.  ASalley Slaughter  PA-C 04/22/2020, 8:53 AM  Contact #  [phone]

## 2020-04-22 NOTE — Progress Notes (Signed)
PROGRESS NOTE    CLARIVEL CALLAWAY  FHL:[id number] DOB: 04-May-1955 DOA: 04/20/2020 PCP: Lawerance Cruel, MD    Brief Narrative:  65 year old female with past medical history of of alcoholic cirrhosis (complicated by hepatic encephalopathy and chronic thrombocytopenia), chronic kidney disease stage IIIa, alcohol abuse, coronary artery disease (nonobstrucive mulivessel disease via cath 03/7340), systolic and diastolic congestive heart failure (EF 40-45% with G1DD) and recent complication of rectus sheath hematoma who presents to [facility] emergency department with abdominal pain and vomiting. Patietn was found to be thrombocytopenic with elevated INR in the setting of rectus sheath hematoma. GI was consulted  Assessment & Plan:   Principal Problem:   Acute blood loss anemia Active Problems:   Thrombocytopenia ([place])   Hypokalemia   Coronary artery disease involving native coronary artery of native heart without angina pectoris   Hepatic encephalopathy (HCC)   Rectus sheath hematoma   Alcoholic cirrhosis of liver with ascites (HCC)   Chronic kidney disease, stage 3a (HCC)   History of alcohol abuse   Coagulopathy (HCC)   Hypomagnesemia   Chronic combined systolic (congestive) and diastolic (congestive) heart failure (HCC)   AF (paroxysmal atrial fibrillation) (HCC)   Prolonged QT interval    Acute blood loss anemia secondary to rectus sheath hematoma in the setting of coagulopathy and thrombocytopenia  CT imaging of the abdomen and pelvis reveals enlarging rectus sheath hematoma despite embolization at [facility] last week.  This is associated with a substantial drop in hemoglobin on presentation at 6.6 from 7.8 from discharge from [facility]  Pt had been given vitamin k with INR trending down  Plts transfused 1 unit of PRBC, 1 unit of platelets, and 2 units of cryoprecipitate at time of presentation  Hgb is stable at 7.8, plts 71  Dr. Cyd Silence discussed the CT images with Dr.  Markus Daft with interventional radiology, who did not feel that the current hematoma is expansive enough to intervene especially after the recent intervention.  Consulted PT    Hypokalemia  Recurrent severe hypokalemia  Replaced  Continued on home regimen of spironolactone 100 mg daily as blood pressure tolerates  Recheck bmet in AM    Coronary artery disease involving native coronary artery of native heart without angina pectoris  Remains chest pain-free  No antiplatelet therapy due to coagulopathy    Hepatic encephalopathy (HCC)  Much improved with lactulose  Lactulose decreased to BID per GI  Goal 2-3 BM daily    Alcoholic cirrhosis of liver with ascites ([place]) - Advanced disease with numerous complications including hepatic encephalopathy, chronic thrombocytopenia and substantial coagulopathy  Meld score noted to be 33,  suggestive of a 52.6% three-month mortality.  Patient to follow up at [facility]  GI had been following and has since signed off  Have consulted PT as pt is markedly weaker this visit  Appreciate input by Palliative Care. Recommendation for home-based palliative care and f/u with [facility] hepatology    Chronic kidney disease, stage 3a ([place]) - Strict intake and output monitoring  Creatinine near baseline  Minimizing nephrotoxic agents as much as possible  Serial chemistries to monitor renal function and electrolytes    History of alcohol abuse  Patient reports that she has not been active the drinking since "the holidays."  Continue to recommend complete abstinence from ETOH    Chronic combined systolic (congestive) and diastolic (congestive) heart failure ([place])  Most of patient's anasarca/volume overload is secondary to low oncotic pressure from liver disease and not  due to acute heart failure  Resuming previously prescribed regimen of spironolactone 100 mg daily as blood pressure tolerates    AF (paroxysmal  atrial fibrillation) (HCC)  No need for anticoagulation due to underlying coagulopathy  Remains rate controlled    Prolonged QT interval  Markedly prolonged QT interval on admission EKG, likely exacerbated by electrolyte abnormalities  Temporarily holding patient's usual regimen of daily ciprofloxacin for SBP prophylaxis and psychotropic agents  Will repeat EKG today, if QTc is improved, would resume prophylactic abx and psychotropic meds  DVT prophylaxis: SCD's Code Status: DNR Family Communication: Pt in room, family not at bedside  Status is: Inpatient  Remains inpatient appropriate because:IV treatments appropriate due to intensity of illness or inability to take PO and Inpatient level of care appropriate due to severity of illness   Dispo: The patient is from: Home              Anticipated d/c is to: Home              Patient currently is not medically stable to d/c.   Difficult to place patient No   Consultants:   GI  Dr. Cyd Silence discussed with IR  Procedures:     Antimicrobials: Anti-infectives (From admission, onward)   Start     Dose/Rate Route Frequency Ordered Stop   04/21/20 1200  cefTRIAXone (ROCEPHIN) 2 g in sodium chloride 0.9 % 100 mL IVPB        2 g 200 mL/hr over 30 Minutes Intravenous Every 24 hours 04/21/20 0924        Subjective: Still complaining of abd pain, more alert  Objective: Vitals:   04/22/20 0500 04/22/20 0600 04/22/20 0800 04/22/20 1200  BP: (!) 106/52 (!) 101/49 (!) 116/51   Pulse: 99 100 99   Resp: (!) 21 19 18    Temp:   99.4 F (37.4 C) 98.6 F (37 C)  TempSrc:   Oral Oral  SpO2: 95% 94% 96%   Weight:      Height:        Intake/Output Summary (Last 24 hours) at 04/22/2020 1321 Last data filed at 04/22/2020 1200 Gross per 24 hour  Intake 848.93 ml  Output 200 ml  Net 648.93 ml   Filed Weights   04/21/20 0700  Weight: 80.1 kg    Examination: General exam: Conversant, in no acute distress Respiratory  system: normal chest rise, clear, no audible wheezing Cardiovascular system: regular rhythm, s1-s2 Gastrointestinal system: Tender, mildly distended Central nervous system: No seizures, no tremors Extremities: No cyanosis, no joint deformities Skin: No rashes, echymosis over abd Psychiatry: Affect normal // no auditory hallucinations   Data Reviewed: I have personally reviewed following labs and imaging studies  CBC: Recent Labs  Lab 04/20/20 2351 04/21/20 0948 04/21/20 1619 04/21/20 2157 04/22/20 0217  WBC 13.0* 11.5* 9.3 9.3 9.3  NEUTROABS 10.3*  --  6.6  --   --   HGB 6.6* 8.2* 7.9* 8.1* 7.8*  HCT 20.0* 23.4* 24.3* 25.3* 24.4*  MCV 108.1* 101.7* 105.7* 108.1* 107.0*  PLT 47* 37* 77* 71* 71*   Basic Metabolic Panel: Recent Labs  Lab 04/20/20 2351 04/21/20 0948 04/22/20 0217  NA 133* 133* 138  K 2.8* 3.9 3.7  CL 111 110 113*  CO2 17* 17* 16*  GLUCOSE 87 89 105*  BUN 14 16 12   CREATININE 1.18* 0.81 1.11*  CALCIUM 8.5* 9.3 9.6  MG 1.6* 2.2  --    GFR: Estimated Creatinine Clearance: 56.9 mL/min (  A) (by C-G formula based on SCr of 1.11 mg/dL (H)). Liver Function Tests: Recent Labs  Lab 04/20/20 2351 04/21/20 0948 04/22/20 0217  AST 66* 83* 73*  ALT 33 39 40  ALKPHOS 170* 194* 197*  BILITOT 12.3* 14.4* 13.6*  PROT 4.0* 4.8* 5.0*  ALBUMIN 1.7* 2.1* 2.2*   Recent Labs  Lab 04/20/20 2351  LIPASE 55*   No results for input(s): AMMONIA in the last 168 hours. Coagulation Profile: Recent Labs  Lab 04/21/20 0028 04/21/20 0948 04/21/20 1619 04/22/20 0217  INR 3.8* 2.4* 2.4* 2.3*   Cardiac Enzymes: No results for input(s): CKTOTAL, CKMB, CKMBINDEX, TROPONINI in the last 168 hours. BNP (last 3 results) Recent Labs    08/23/19 1030  PROBNP 190.0*   HbA1C: No results for input(s): HGBA1C in the last 72 hours. CBG: No results for input(s): GLUCAP in the last 168 hours. Lipid Profile: No results for input(s): CHOL, HDL, LDLCALC, TRIG, CHOLHDL,  LDLDIRECT in the last 72 hours. Thyroid Function Tests: No results for input(s): TSH, T4TOTAL, FREET4, T3FREE, THYROIDAB in the last 72 hours. Anemia Panel: No results for input(s): VITAMINB12, FOLATE, FERRITIN, TIBC, IRON, RETICCTPCT in the last 72 hours. Sepsis Labs: No results for input(s): PROCALCITON, LATICACIDVEN in the last 168 hours.  Recent Results (from the past 240 hour(s))  SARS CORONAVIRUS 2 (TAT 6-24 HRS) Nasopharyngeal Nasopharyngeal Swab     Status: None   Collection Time: 04/21/20  5:28 AM   Specimen: Nasopharyngeal Swab  Result Value Ref Range Status   SARS Coronavirus 2 NEGATIVE NEGATIVE Final    Comment: (NOTE) SARS-CoV-2 target nucleic acids are NOT DETECTED.  The SARS-CoV-2 RNA is generally detectable in upper and lower respiratory specimens during the acute phase of infection. Negative results do not preclude SARS-CoV-2 infection, do not rule out co-infections with other pathogens, and should not be used as the sole basis for treatment or other patient management decisions. Negative results must be combined with clinical observations, patient history, and epidemiological information. The expected result is Negative.  Fact Sheet for Patients: SugarRoll.be  Fact Sheet for Healthcare Providers: https://www.woods-mathews.com/  This test is not yet approved or cleared by the [place] FDA and  has been authorized for detection and/or diagnosis of SARS-CoV-2 by FDA under an Emergency Use Authorization (EUA). This EUA will remain  in effect (meaning this test can be used) for the duration of the COVID-19 declaration under Se ction 564(b)(1) of the Act, 21 U.S.C. section 360bbb-3(b)(1), unless the authorization is terminated or revoked sooner.  Performed at [facility], [place] [address], [place], [place] [address]   MRSA PCR Screening     Status: Abnormal   Collection Time: 04/21/20  6:25 AM   Specimen: Nasal  Mucosa; Nasopharyngeal  Result Value Ref Range Status   MRSA by PCR POSITIVE (A) NEGATIVE Final    Comment:        The GeneXpert MRSA Assay (FDA approved for NASAL specimens only), is one component of a comprehensive MRSA colonization surveillance program. It is not intended to diagnose MRSA infection nor to guide or monitor treatment for MRSA infections. RESULT CALLED TO, READ BACK BY AND VERIFIED WITH: CAUDLE,L. RN AT 1127 04/21/20 MULLINS,T Performed at [facility], [place] [address], [place], [place] [address]      Radiology Studies: CT ABDOMEN PELVIS W CONTRAST  Result Date: 04/21/2020 CLINICAL DATA:  Nausea, vomiting, abdominal distension EXAM: CT ABDOMEN AND PELVIS WITH CONTRAST TECHNIQUE: Multidetector CT imaging of the  abdomen and pelvis was performed using the standard protocol following bolus administration of intravenous contrast. CONTRAST:  182m OMNIPAQUE IOHEXOL 300 MG/ML  SOLN COMPARISON:  06/25/2018 FINDINGS: Lower chest: Small to moderate left pleural effusion has developed, incompletely evaluated, with compressive atelectasis of the left lower lobe. Mild atelectasis or infiltrate within the visualized right middle lobe. Extensive right coronary artery calcification. Global cardiac size within normal limits. Hepatobiliary: Progressive changes of cirrhosis with increasing nodularity of the liver surface, decreasing overall liver volume, enlargement of portosystemic vascular collaterals, and development of moderate ascites. Multiple hepatic cysts are again identified, better assessed on sonogram of 02/21/2020. No enhancing liver mass identified. No intra or extrahepatic biliary ductal dilation. Cholelithiasis noted without definite CT evidence of acute cholecystitis. Pancreas: Unremarkable Spleen: Mild splenomegaly is again identified, stable since prior examination. No intrasplenic mass identified. The splenic vein is patent. Adrenals/Urinary Tract: The adrenal  glands are unremarkable. The kidneys are normal in size and position. Multiple simple cortical cysts are seen bilaterally. 3 mm nonobstructing calculus is seen within the lower pole of the left kidney. No hydronephrosis. No ureteral calculi. The bladder is unremarkable. Stomach/Bowel: The stomach and small bowel are unremarkable. There is marked circumferential wall thickening involving the cecum and ascending colon which may reflect changes of portal colopathy, though a superimposed infectious or inflammatory ascending colitis could appear similarly. No evidence of obstruction. No free intraperitoneal gas. Moderate ascites. Vascular/Lymphatic: Progressive dilation of the a portal vein likely reflective of increasing portal venous pressure. Prominent gastroesophageal varices are again identified. Recanalization of the umbilical vein again noted. Since the prior examination, embolization coils are identified within the inferior epigastric artery as well as high attenuation intraluminal material likely representing residual glue or Onyx. There is moderate aortoiliac atherosclerotic calcification. No aortic aneurysm. No pathologic adenopathy within the abdomen and pelvis. Reproductive: Multiple enhancing nodules within the uterus likely representing multiple underlying uterine fibroids. The pelvic organs are otherwise unremarkable. Other: There is enlargement of the right rectus sheath in keeping with the given history of a rectus hematoma. There is moderate diffuse body wall subcutaneous edema. Tiny fat containing umbilical hernia. Musculoskeletal: Subacute fractures of the right superior and inferior pubic rami and right sacral ala are identified. Remote healed fracture of the left inferior pubic ramus noted. Severe T11 compression fracture has developed, age indeterminate, with near vertebral plana deformity and mild retropulsion of the superior posterior aspect of the T11 vertebral body resulting in moderate  central canal stenosis. IMPRESSION: Enlargement of the a right rectus sheath in keeping with a rectus hematoma. Embolization of the right inferior epigastric artery. No evidence of active extravasation. Progressive changes of cirrhosis and portal venous hypertension with enlarging portal venous and portosystemic collateral vasculature, progressive volume loss of the liver, and development of moderate ascites. Interval development of marked circumferential wall thickening of the cecum and ascending colon suggestive of a portal colopathy, though infectious or inflammatory ascending colitis could appear similarly. Interval development of moderate anasarca with left pleural effusion, diffuse body wall subcutaneous edema and ascites. Severe wedge compression deformity of T11 with mild retropulsion, age indeterminate. This was not seen on prior CT examination of the chest of 10/17/2019. Correlation for point tenderness is recommended. This could be better assessed with MRI examination, if indicated. Additional subacute fractures of the right superior and inferior pubic rami as well as the right sacral ala. Cholelithiasis. Minimal left nephrolithiasis. Aortic Atherosclerosis (ICD10-I70.0). Electronically Signed   By: AFidela SalisburyMD   On: 04/21/2020 03:33  Scheduled Meds: . sodium chloride   Intravenous Once  . Chlorhexidine Gluconate Cloth  6 each Topical Daily  . lactulose  30 g Oral BID  . mouth rinse  15 mL Mouth Rinse BID  . mupirocin ointment  1 application Nasal BID  . phytonadione  10 mg Oral Daily  . spironolactone  100 mg Oral Daily   Continuous Infusions: . cefTRIAXone (ROCEPHIN)  IV Stopped (04/22/20 1203)     LOS: 1 day   Marylu Lund, MD [facility] Pager On Amion  If 7PM-7AM, please contact night-coverage 04/22/2020, 1:21 PM

## 2020-04-23 DIAGNOSIS — D696 Thrombocytopenia, unspecified: Secondary | ICD-10-CM

## 2020-04-23 DIAGNOSIS — S301XXD Contusion of abdominal wall, subsequent encounter: Secondary | ICD-10-CM

## 2020-04-23 LAB — COMPREHENSIVE METABOLIC PANEL
ALT: 42 U/L (ref 0–44)
AST: 73 U/L — ABNORMAL HIGH (ref 15–41)
Albumin: 2 g/dL — ABNORMAL LOW (ref 3.5–5.0)
Alkaline Phosphatase: 213 U/L — ABNORMAL HIGH (ref 38–126)
Anion gap: 4 — ABNORMAL LOW (ref 5–15)
BUN: 12 mg/dL (ref 8–23)
CO2: 18 mmol/L — ABNORMAL LOW (ref 22–32)
Calcium: 9.3 mg/dL (ref 8.9–10.3)
Chloride: 112 mmol/L — ABNORMAL HIGH (ref 98–111)
Creatinine, Ser: 1.04 mg/dL — ABNORMAL HIGH (ref 0.44–1.00)
GFR, Estimated: >60 mL/min (ref >60)
Glucose, Bld: 106 mg/dL — ABNORMAL HIGH (ref 70–99)
Potassium: 3.9 mmol/L (ref 3.5–5.1)
Sodium: 134 mmol/L — ABNORMAL LOW (ref 135–145)
Total Bilirubin: 12.1 mg/dL — ABNORMAL HIGH (ref 0.3–1.2)
Total Protein: 4.9 g/dL — ABNORMAL LOW (ref 6.5–8.1)

## 2020-04-23 LAB — CBC
HCT: 25.1 % — ABNORMAL LOW (ref 36.0–46.0)
Hemoglobin: 8 g/dL — ABNORMAL LOW (ref 12.0–15.0)
MCH: 35.2 pg — ABNORMAL HIGH (ref 26.0–34.0)
MCHC: 31.9 g/dL (ref 30.0–36.0)
MCV: 110.6 fL — ABNORMAL HIGH (ref 80.0–100.0)
Platelets: 70 10*3/uL — ABNORMAL LOW (ref 150–400)
RBC: 2.27 MIL/uL — ABNORMAL LOW (ref 3.87–5.11)
RDW: 26.2 % — ABNORMAL HIGH (ref 11.5–15.5)
WBC: 9 10*3/uL (ref 4.0–10.5)
nRBC: 0 % (ref 0.0–0.2)

## 2020-04-23 LAB — PROTIME-INR
INR: 2.5 — ABNORMAL HIGH (ref 0.8–1.2)
Prothrombin Time: 26.7 seconds — ABNORMAL HIGH (ref 11.4–15.2)

## 2020-04-23 MED ORDER — ZINC SULFATE 220 (50 ZN) MG PO CAPS
220.0000 mg | ORAL_CAPSULE | Freq: Every day | ORAL | Status: DC
  Administered 2020-04-23 – 2020-04-26 (×4): 220 mg via ORAL
  Filled 2020-04-23 (×5): qty 1

## 2020-04-23 MED ORDER — FLUOXETINE HCL 20 MG PO CAPS: 60.0000 mg | ORAL_CAPSULE | Freq: Every morning | ORAL | Status: DC

## 2020-04-23 MED ORDER — ALPRAZOLAM 0.25 MG PO TABS
0.2500 mg | ORAL_TABLET | Freq: Once | ORAL | Status: AC
  Administered 2020-04-23: 0.25 mg via ORAL
  Filled 2020-04-23: qty 1

## 2020-04-23 MED ORDER — POLYVINYL ALCOHOL 1.4 % OP SOLN: 1.0000 [drp] | OPHTHALMIC | Status: DC | PRN

## 2020-04-23 MED ORDER — ALBUMIN HUMAN 25 % IV SOLN
25.0000 g | Freq: Once | INTRAVENOUS | Status: AC
  Administered 2020-04-23: 25 g via INTRAVENOUS
  Filled 2020-04-23: qty 100

## 2020-04-23 MED ORDER — FUROSEMIDE 10 MG/ML IJ SOLN
40.0000 mg | Freq: Once | INTRAMUSCULAR | Status: AC
  Administered 2020-04-23: 40 mg via INTRAVENOUS
  Filled 2020-04-23: qty 4

## 2020-04-23 NOTE — Progress Notes (Signed)
[facility] ([facility])  Hospital Liaison RN note         Notified by TOC manager of patient/family request for [facility] Palliative services at home after discharge.              [facility] Palliative team will follow up with patient after discharge.         Please call with any hospice or palliative related questions.         Thank you for the opportunity to participate in this patient's care.     Chrislyn King, BSN, RN [facility] Hospital Liaison (listed on AMION under Hospice/[facility])    [phone] (24h on call)    

## 2020-04-23 NOTE — Progress Notes (Signed)
At 22:15 gave report to E.Roma Kayser RN and transferred patient via bed to [id number] at 00:05

## 2020-04-23 NOTE — Progress Notes (Signed)
PROGRESS NOTE    Dana Watkins  OIZ:[id number] DOB: October 08, 1955 DOA: 04/20/2020 PCP: Lawerance Cruel, MD    Chief Complaint  Patient presents with  . Vomiting    Brief Narrative:  65 year old female with past medical history of of alcoholic cirrhosis(complicated by hepatic encephalopathyandchronic thrombocytopenia),chronic kidney disease stage IIIa, alcohol abuse, coronary artery disease(nonobstrucive mulivessel disease via cath 03/3823),[id number] and diastolic congestive heart failure(EF 40-45% with A1PF)XTK recent complication of rectus sheath hematoma who presents to [facility] emergency department with abdominal pain and vomiting. Patietn was found to be thrombocytopenic with elevated INR in the setting of rectus sheath hematoma. GI was consulted   Assessment & Plan:   Principal Problem:   Acute blood loss anemia Active Problems:   Thrombocytopenia ([place])   Hypokalemia   Coronary artery disease involving native coronary artery of native heart without angina pectoris   Hepatic encephalopathy (HCC)   Rectus sheath hematoma   Alcoholic cirrhosis of liver with ascites (HCC)   Chronic kidney disease, stage 3a (HCC)   History of alcohol abuse   Coagulopathy (HCC)   Hypomagnesemia   Chronic combined systolic (congestive) and diastolic (congestive) heart failure (HCC)   AF (paroxysmal atrial fibrillation) (HCC)   Prolonged QT interval  1 acute blood loss anemia secondary to rectus sheath hematoma in the setting of coagulopathy and thrombocytopenia -CT abdomen and pelvis revealed enlarging rectus sheath hematoma despite embolization at [facility] last week.- -Patient noted to have a significant drop in hemoglobin on presentation at 6.6 from 7.8 on discharge from [facility]. -Patient placed on vitamin K with INR trending down currently at 2.5. -Status post transfusion 1 unit packed red blood cells, 1 unit of platelets, 2 units of cryoprecipitate at time of  presentation. -Hemoglobin currently stable at 8.0 with a platelet count of 70. -Dr. Cyd Silence discussed CT findings with Dr. Anselm Pancoast with IR who did not feel that current hematoma is expansive enough to warrant intervention especially with recent intervention that was done. -GI following and recommended continued supportive care with outpatient follow-up with [facility] hepatology.  2.  Hypokalemia -Patient noted to have recurrent severe hypokalemia. -Potassium currently at 3.9. -Spironolactone resumed.   -Repeat labs in the morning.  3.  Coronary artery disease -Currently chest pain-free. -Not on antiplatelet therapy due to coagulopathy. -Continue spironolactone.  4.  Hepatic encephalopathy -Clinical improvement with lactulose. -Lactulose decreased to twice daily per GI with goal of 2-3 bowel movements per day.  5.  Decompensated alcoholic cirrhosis of the liver with ascites -Patient with advanced disease with numerous complications including hepatic encephalopathy, chronic thrombocytopenia, coagulopathy -MELD score of 33, suggestive of 52.6% 22-monthmortality. -[facility] -PT consulted. -GI not recommending paracentesis at this time. -Continue SBP prophylaxis with IV Rocephin 1 g daily. -Continue 2 g sodium restricted diet. -Spironolactone resumed. -We will give a dose of Lasix 40 mg IV x1. -Patient seen by palliative care. -[facility] -GI was following but have signed off.  6.  Chronic kidney disease stage IIIa -Renal function close to baseline. -Minimize nephrotoxic agents. -Strict I's and O's. -Monitor on spironolactone.  7.  History of alcohol abuse -Patient noted to have been abstinent from drinking since the holidays. -Continue to recommend complete abstinence.  8.  Chronic combined systolic and diastolic heart failure -Noted most of patient's anasarca/volume overload secondary to low oncotic pressure  from liver disease and not due to heart failure -Patient with abdominal distention with some ascites. -Spironolactone  resumed at 100 mg daily. -We will give a dose of IV albumin x1 followed by Lasix 40 mg IV x1  9.  Atrial fibrillation -Rate controlled -Not on anticoagulation due to coagulopathy.  10.  Prolonged QT interval -Patient noted to have a markedly prolonged QT interval on admission likely felt exacerbated by electrolyte abnormalities. -Repeat EKG with resolution of QT prolongation. -Currently on IV Rocephin for SBP prophylaxis and ciprofloxacin on hold. -Likely resume psychotropic medications on discharge.     DVT prophylaxis: INR supratherapeutic at 2.5 Code Status: DNR Family Communication: Updated patient and son at bedside. Disposition:   Status is: Inpatient    Dispo: The patient is from: Home              Anticipated d/c is to: Home with hospice following.              Patient currently with rectus sheath hematoma, H&H being monitored, decompensated cirrhosis, not stable for discharge.   Difficult to place patient no       Consultants:   Palliative care: Dr. Rowe Pavy 04/22/2020  Gastroenterology: Dr. Michail Sermon 04/21/2020  Procedures:   CT abdomen and pelvis 04/21/2020  Transfusion of 2 units cryoprecipitate 04/21/2020  Transfusion 1 unit packed red blood cells 04/22/2018  Transfusion 1 unit platelets 04/21/2020  Antimicrobials:   IV Rocephin 04/21/2020 >>>>     Subjective: Laying in bed.  Denies any chest pain.  No significant shortness of breath.  States improvement with abdominal pain.  Stated able to ambulate with physical therapy early on this morning.  Son at bedside.  Objective: Vitals:   04/22/20 2000 04/22/20 2200 04/23/20 0019 04/23/20 0536  BP: 135/69 119/62 131/75 131/69  Pulse: (!) 105 (!) 104 100 (!) 110  Resp: (!) 22 (!) 22 (!) 21 20  Temp:   98.6 F (37 C) 99.3 F (37.4 C)  TempSrc:   Oral Oral  SpO2: 98% 99% 98% 94%   Weight:      Height:        Intake/Output Summary (Last 24 hours) at 04/23/2020 1332 Last data filed at 04/23/2020 1010 Gross per 24 hour  Intake 1120 ml  Output 300 ml  Net 820 ml   Filed Weights   04/21/20 0700  Weight: 80.1 kg    Examination:  General exam: Appears calm and comfortable  Respiratory system: Clear to auscultation. Respiratory effort normal. Cardiovascular system: S1 & S2 heard, RRR. No JVD, murmurs, rubs, gallops or clicks.  1+ bilateral lower extremity edema . Gastrointestinal system: Abdomen is soft, distended, positive bowel sounds, some tenderness to palpation in the lower abdominal midabdomen.  Ecchymosis noted on mid to left side of the abdomen.  Central nervous system: Alert and oriented. No focal neurological deficits. Extremities: 1+ bilateral lower extremity edema.   Skin: No rashes, lesions or ulcers Psychiatry: Judgement and insight appear normal. Mood & affect appropriate.     Data Reviewed: I have personally reviewed following labs and imaging studies  CBC: Recent Labs  Lab 04/20/20 2351 04/21/20 0948 04/21/20 1619 04/21/20 2157 04/22/20 0217 04/23/20 0535  WBC 13.0* 11.5* 9.3 9.3 9.3 9.0  NEUTROABS 10.3*  --  6.6  --   --   --   HGB 6.6* 8.2* 7.9* 8.1* 7.8* 8.0*  HCT 20.0* 23.4* 24.3* 25.3* 24.4* 25.1*  MCV 108.1* 101.7* 105.7* 108.1* 107.0* 110.6*  PLT 47* 37* 77* 71* 71* 70*    Basic Metabolic Panel: Recent Labs  Lab 04/20/20 2351 04/21/20 0948 04/22/20  0217 04/23/20 0535  NA 133* 133* 138 134*  K 2.8* 3.9 3.7 3.9  CL 111 110 113* 112*  CO2 17* 17* 16* 18*  GLUCOSE 87 89 105* 106*  BUN 14 16 12 12   CREATININE 1.18* 0.81 1.11* 1.04*  CALCIUM 8.5* 9.3 9.6 9.3  MG 1.6* 2.2  --   --     GFR: Estimated Creatinine Clearance: 60.7 mL/min (A) (by C-G formula based on SCr of 1.04 mg/dL (H)).  Liver Function Tests: Recent Labs  Lab 04/20/20 2351 04/21/20 0948 04/22/20 0217 04/23/20 0535  AST 66* 83* 73* 73*  ALT 33  39 40 42  ALKPHOS 170* 194* 197* 213*  BILITOT 12.3* 14.4* 13.6* 12.1*  PROT 4.0* 4.8* 5.0* 4.9*  ALBUMIN 1.7* 2.1* 2.2* 2.0*    CBG: No results for input(s): GLUCAP in the last 168 hours.   Recent Results (from the past 240 hour(s))  SARS CORONAVIRUS 2 (TAT 6-24 HRS) Nasopharyngeal Nasopharyngeal Swab     Status: None   Collection Time: 04/21/20  5:28 AM   Specimen: Nasopharyngeal Swab  Result Value Ref Range Status   SARS Coronavirus 2 NEGATIVE NEGATIVE Final    Comment: (NOTE) SARS-CoV-2 target nucleic acids are NOT DETECTED.  The SARS-CoV-2 RNA is generally detectable in upper and lower respiratory specimens during the acute phase of infection. Negative results do not preclude SARS-CoV-2 infection, do not rule out co-infections with other pathogens, and should not be used as the sole basis for treatment or other patient management decisions. Negative results must be combined with clinical observations, patient history, and epidemiological information. The expected result is Negative.  Fact Sheet for Patients: SugarRoll.be  Fact Sheet for Healthcare Providers: https://www.woods-mathews.com/  This test is not yet approved or cleared by the [place] FDA and  has been authorized for detection and/or diagnosis of SARS-CoV-2 by FDA under an Emergency Use Authorization (EUA). This EUA will remain  in effect (meaning this test can be used) for the duration of the COVID-19 declaration under Se ction 564(b)(1) of the Act, 21 U.S.C. section 360bbb-3(b)(1), unless the authorization is terminated or revoked sooner.  Performed at [facility], [place] [address], [place], [place] [address]   MRSA PCR Screening     Status: Abnormal   Collection Time: 04/21/20  6:25 AM   Specimen: Nasal Mucosa; Nasopharyngeal  Result Value Ref Range Status   MRSA by PCR POSITIVE (A) NEGATIVE Final    Comment:        The GeneXpert MRSA Assay  (FDA approved for NASAL specimens only), is one component of a comprehensive MRSA colonization surveillance program. It is not intended to diagnose MRSA infection nor to guide or monitor treatment for MRSA infections. RESULT CALLED TO, READ BACK BY AND VERIFIED WITH: CAUDLE,L. RN AT 1127 04/21/20 MULLINS,T Performed at [facility], [place] [address], [place], [place] [address]          Radiology Studies: No results found.      Scheduled Meds: . Chlorhexidine Gluconate Cloth  6 each Topical Daily  . FLUoxetine  60 mg Oral q morning  . lactulose  30 g Oral BID  . mouth rinse  15 mL Mouth Rinse BID  . mupirocin ointment  1 application Nasal BID  . spironolactone  100 mg Oral Daily  . zinc sulfate  220 mg Oral Daily   Continuous Infusions: . cefTRIAXone (ROCEPHIN)  IV 2 g (04/23/20 1328)     LOS: 2 days  Time spent: 35 minutes    Irine Seal, MD [facility]   To contact the attending provider between 7A-7P or the covering provider during after hours 7P-7A, please log into the web site [email or web address] and access using universal  password for that web site. If you do not have the password, please call the hospital operator.  04/23/2020, 1:32 PM

## 2020-04-23 NOTE — Evaluation (Signed)
Physical Therapy Evaluation Patient Details Name: Dana Watkins MRN: [id number] DOB: August 23, 1955 Today's Date: 04/23/2020   History of Present Illness  65 year old female  who presents to [facility] emergency department with abdominal pain and vomiting. Patietn was found to be thrombocytopenic with elevated INR in the setting of rectus sheath hematoma with past medical history of of alcoholic cirrhosis (complicated by hepatic encephalopathy and chronic thrombocytopenia), chronic kidney disease stage IIIa, alcohol abuse, coronary artery disease (nonobstrucive mulivessel disease via cath 0/0867), systolic and diastolic congestive heart failure (EF 40-45% with G1DD) and recent complication of rectus sheath hematoma  Clinical Impression  Pt admitted with above diagnosis. Pt ambulated 52' + 18' with RW with min A for unsteadiness, no overt loss of balance, distance limited by fatigue. 2/4 dyspnea with ambulation, SaO2 97% on room air.  Pt currently with functional limitations due to the deficits listed below (see PT Problem List). Pt will benefit from skilled PT to increase their independence and safety with mobility to allow discharge to the venue listed below.       Follow Up Recommendations Home health PT;Supervision for mobility/OOB    Equipment Recommendations  None recommended by PT    Recommendations for Other Services       Precautions / Restrictions Precautions Precautions: Fall Precaution Comments: 1 fall in 6 months (slipped on ice) Restrictions Weight Bearing Restrictions: No      Mobility  Bed Mobility Overal bed mobility: Modified Independent             General bed mobility comments: used rail    Transfers Overall transfer level: Needs assistance Equipment used: Rolling walker (2 wheeled) Transfers: Sit to/from Stand Sit to Stand: Mod assist         General transfer comment: min A from elevated bed, mod A to rise from commode; mildly unsteady  initially in standing  Ambulation/Gait Ambulation/Gait assistance: Min assist Gait Distance (Feet): 30 Feet Assistive device: Rolling walker (2 wheeled) Gait Pattern/deviations: Wide base of support;Step-through pattern;Decreased stride length Gait velocity: decr   General Gait Details: 100' + 18' with RW, min A for unsteadiness but no overt loss of balance, 2/4 dyspnea, Sa O2 97% on room air, distance limited by fatigue  Stairs            Wheelchair Mobility    Modified Rankin (Stroke Patients Only)       Balance Overall balance assessment: Needs assistance Sitting-balance support: Feet supported;No upper extremity supported Sitting balance-Leahy Scale: Fair     Standing balance support: Single extremity supported Standing balance-Leahy Scale: Poor Standing balance comment: relies on single UE support                             Pertinent Vitals/Pain Pain Assessment: 0-10 Pain Score: 5  Pain Location: abdomen Pain Descriptors / Indicators: Aching Pain Intervention(s): Limited activity within patient's tolerance;Monitored during session;Premedicated before session    Home Living Family/patient expects to be discharged to:: Private residence Living Arrangements: Other relatives Available Help at Discharge: Family;Friend(s);Available 24 hours/day Type of Home: Apartment Home Access: Level entry     Home Layout: One level Home Equipment: Walker - 2 wheels;Grab bars - tub/shower;Hand held shower head; hospital bed      Prior Function Level of Independence: Independent with assistive device(s)         Comments: ex-husband and other friends/family can provide 24* assist, used RW PTA     Hand  Dominance   Dominant Hand: Right    Extremity/Trunk Assessment   Upper Extremity Assessment Upper Extremity Assessment: Overall WFL for tasks assessed    Lower Extremity Assessment Lower Extremity Assessment: Overall WFL for tasks assessed (sensation  intact to light touch)    Cervical / Trunk Assessment Cervical / Trunk Assessment: Normal  Communication   Communication: No difficulties  Cognition Arousal/Alertness: Awake/alert Behavior During Therapy: WFL for tasks assessed/performed Overall Cognitive Status: Within Functional Limits for tasks assessed                                        General Comments      Exercises     Assessment/Plan    PT Assessment Patient needs continued PT services  PT Problem List Decreased mobility;Decreased activity tolerance;Decreased balance       PT Treatment Interventions Therapeutic activities;Therapeutic exercise;Functional mobility training;Gait training;Patient/family education    PT Goals (Current goals can be found in the Care Plan section)  Acute Rehab PT Goals Patient Stated Goal: to be able to clean her house (esp vacuum) PT Goal Formulation: With patient Time For Goal Achievement: 05/07/20 Potential to Achieve Goals: Fair    Frequency Min 3X/week   Barriers to discharge        Co-evaluation               AM-PAC PT "6 Clicks" Mobility  Outcome Measure Help needed turning from your back to your side while in a flat bed without using bedrails?: None Help needed moving from lying on your back to sitting on the side of a flat bed without using bedrails?: A Little Help needed moving to and from a bed to a chair (including a wheelchair)?: A Little Help needed standing up from a chair using your arms (e.g., wheelchair or bedside chair)?: A Little Help needed to walk in hospital room?: A Little Help needed climbing 3-5 steps with a railing? : A Little 6 Click Score: 19    End of Session Equipment Utilized During Treatment: Gait belt Activity Tolerance: Patient tolerated treatment well;Patient limited by fatigue Patient left: in chair;with call bell/phone within reach;with chair alarm set Nurse Communication: Mobility status PT Visit Diagnosis:  Difficulty in walking, not elsewhere classified (R26.2);Unsteadiness on feet (R26.81);Pain    Time: 1700-1749 PT Time Calculation (min) (ACUTE ONLY): 17 min   Charges:   PT Evaluation $PT Eval Moderate Complexity: 1 Mod         Philomena Doheny PT 04/23/2020  Acute Rehabilitation Services Pager [phone] Office [phone]

## 2020-04-24 ENCOUNTER — Inpatient Hospital Stay (HOSPITAL_COMMUNITY): Payer: Medicare HMO

## 2020-04-24 DIAGNOSIS — D62 Acute posthemorrhagic anemia: Secondary | ICD-10-CM

## 2020-04-24 DIAGNOSIS — I480 Paroxysmal atrial fibrillation: Secondary | ICD-10-CM

## 2020-04-24 LAB — CBC WITH DIFFERENTIAL/PLATELET
Abs Immature Granulocytes: 0.05 10*3/uL (ref 0.00–0.07)
Basophils Absolute: 0 10*3/uL (ref 0.0–0.1)
Basophils Relative: 1 %
Eosinophils Absolute: 0.2 10*3/uL (ref 0.0–0.5)
Eosinophils Relative: 3 %
HCT: 25.1 % — ABNORMAL LOW (ref 36.0–46.0)
Hemoglobin: 8.2 g/dL — ABNORMAL LOW (ref 12.0–15.0)
Immature Granulocytes: 1 %
Lymphocytes Relative: 16 %
Lymphs Abs: 1.4 10*3/uL (ref 0.7–4.0)
MCH: 35.5 pg — ABNORMAL HIGH (ref 26.0–34.0)
MCHC: 32.7 g/dL (ref 30.0–36.0)
MCV: 108.7 fL — ABNORMAL HIGH (ref 80.0–100.0)
Monocytes Absolute: 1.4 10*3/uL — ABNORMAL HIGH (ref 0.1–1.0)
Monocytes Relative: 16 %
Neutro Abs: 5.4 10*3/uL (ref 1.7–7.7)
Neutrophils Relative %: 63 %
Platelets: 69 10*3/uL — ABNORMAL LOW (ref 150–400)
RBC: 2.31 MIL/uL — ABNORMAL LOW (ref 3.87–5.11)
RDW: 26.2 % — ABNORMAL HIGH (ref 11.5–15.5)
WBC: 8.5 10*3/uL (ref 4.0–10.5)
nRBC: 0 % (ref 0.0–0.2)

## 2020-04-24 LAB — COMPREHENSIVE METABOLIC PANEL
ALT: 35 U/L (ref 0–44)
AST: 65 U/L — ABNORMAL HIGH (ref 15–41)
Albumin: 2.3 g/dL — ABNORMAL LOW (ref 3.5–5.0)
Alkaline Phosphatase: 186 U/L — ABNORMAL HIGH (ref 38–126)
Anion gap: 5 (ref 5–15)
BUN: 12 mg/dL (ref 8–23)
CO2: 19 mmol/L — ABNORMAL LOW (ref 22–32)
Calcium: 9.2 mg/dL (ref 8.9–10.3)
Chloride: 111 mmol/L (ref 98–111)
Creatinine, Ser: 1.17 mg/dL — ABNORMAL HIGH (ref 0.44–1.00)
GFR, Estimated: 52 mL/min — ABNORMAL LOW (ref >60)
Glucose, Bld: 106 mg/dL — ABNORMAL HIGH (ref 70–99)
Potassium: 3.4 mmol/L — ABNORMAL LOW (ref 3.5–5.1)
Sodium: 135 mmol/L (ref 135–145)
Total Bilirubin: 12.7 mg/dL — ABNORMAL HIGH (ref 0.3–1.2)
Total Protein: 4.9 g/dL — ABNORMAL LOW (ref 6.5–8.1)

## 2020-04-24 LAB — ECHOCARDIOGRAM COMPLETE
Area-P 1/2: 6.54 cm2
Height: 68 in
S' Lateral: 2.9 cm
Weight: 2730.18 oz

## 2020-04-24 LAB — PROTIME-INR
INR: 2.5 — ABNORMAL HIGH (ref 0.8–1.2)
Prothrombin Time: 27.3 seconds — ABNORMAL HIGH (ref 11.4–15.2)

## 2020-04-24 LAB — TROPONIN I (HIGH SENSITIVITY)
Troponin I (High Sensitivity): 27 ng/L — ABNORMAL HIGH (ref <18)
Troponin I (High Sensitivity): 32 ng/L — ABNORMAL HIGH (ref <18)

## 2020-04-24 LAB — MAGNESIUM: Magnesium: 2 mg/dL (ref 1.7–2.4)

## 2020-04-24 MED ORDER — CARVEDILOL 3.125 MG PO TABS
3.1250 mg | ORAL_TABLET | Freq: Two times a day (BID) | ORAL | Status: DC
  Administered 2020-04-24: 3.125 mg via ORAL
  Filled 2020-04-24: qty 1

## 2020-04-24 MED ORDER — ALPRAZOLAM 0.25 MG PO TABS
0.2500 mg | ORAL_TABLET | Freq: Once | ORAL | Status: AC
  Administered 2020-04-24: 0.25 mg via ORAL
  Filled 2020-04-24: qty 1

## 2020-04-24 MED ORDER — POTASSIUM CHLORIDE CRYS ER 20 MEQ PO TBCR
40.0000 meq | EXTENDED_RELEASE_TABLET | Freq: Once | ORAL | Status: AC
  Administered 2020-04-24: 40 meq via ORAL
  Filled 2020-04-24: qty 2

## 2020-04-24 MED ORDER — ALPRAZOLAM 0.25 MG PO TABS: 0.2500 mg | ORAL_TABLET | Freq: Once | ORAL | Status: DC

## 2020-04-24 MED ORDER — METOPROLOL TARTRATE 5 MG/5ML IV SOLN
5.0000 mg | Freq: Once | INTRAVENOUS | Status: AC
  Administered 2020-04-24: 5 mg via INTRAVENOUS
  Filled 2020-04-24: qty 5

## 2020-04-24 MED ORDER — METOPROLOL TARTRATE 5 MG/5ML IV SOLN
2.5000 mg | Freq: Once | INTRAVENOUS | Status: AC
  Administered 2020-04-24: 2.5 mg via INTRAVENOUS
  Filled 2020-04-24: qty 5

## 2020-04-24 MED ORDER — CARVEDILOL 3.125 MG PO TABS: 3.1250 mg | ORAL_TABLET | Freq: Two times a day (BID) | ORAL | Status: DC

## 2020-04-24 MED ORDER — AMIODARONE HCL IN DEXTROSE 360-4.14 MG/200ML-% IV SOLN
30.0000 mg/h | INTRAVENOUS | Status: DC
  Administered 2020-04-24 – 2020-04-25 (×3): 30 mg/h via INTRAVENOUS
  Filled 2020-04-24 (×3): qty 200

## 2020-04-24 MED ORDER — LORAZEPAM 0.5 MG PO TABS
0.5000 mg | ORAL_TABLET | Freq: Every evening | ORAL | Status: DC | PRN
  Administered 2020-04-24: 0.5 mg via ORAL
  Filled 2020-04-24: qty 1

## 2020-04-24 MED ORDER — SODIUM CHLORIDE 0.9 % IV BOLUS
250.0000 mL | Freq: Once | INTRAVENOUS | Status: AC
  Administered 2020-04-24: 250 mL via INTRAVENOUS

## 2020-04-24 MED ORDER — SPIRONOLACTONE 25 MG PO TABS: 100.0000 mg | ORAL_TABLET | Freq: Every day | ORAL | Status: DC

## 2020-04-24 NOTE — Significant Event (Addendum)
Rapid Response Event Note   Reason for Call :  Bedside RN called because patient's telemetry began reading A.fib with HR up to 170-180's.  Initial Focused Assessment:  Patient known to RR RN as recent transfer out of ICU/SD. Patient alert, oriented x 4, and denies new pain, dyspnea, or chest pain. Vital signs show slightly more elevated BP than previous this shift, oxygen saturation normal on room air, and HR varied between 150-180. EKG confirmed afib/RVR with sustained HR in 170-180 range.   Interventions:  Paged on call provider, educated patient & staff.   Plan of Care:  Bedside RN to administer Metoprolol. Monitor for effectiveness. Call provider back if no improvement. Monitor BP every 15 minutes for next two hours. If persists, will discuss transfer to higher level of care.   Event Summary:   MD Notified: 0122 Call Time: 0111 Arrival Time: 0122 End Time: [place], RN

## 2020-04-24 NOTE — Progress Notes (Addendum)
PROGRESS NOTE    Dana Watkins  BDZ:[id number] DOB: August 04, 1955 DOA: 04/20/2020 PCP: Lawerance Cruel, MD    Chief Complaint  Patient presents with  . Vomiting    Brief Narrative:  65 year old female with past medical history of of alcoholic cirrhosis(complicated by hepatic encephalopathyandchronic thrombocytopenia),chronic kidney disease stage IIIa, alcohol abuse, coronary artery disease(nonobstrucive mulivessel disease via cath 03/4194),QIWLNLGX and diastolic congestive heart failure(EF 40-45% with Q1JH)ERD recent complication of rectus sheath hematoma who presents to [facility] emergency department with abdominal pain and vomiting. Patietn was found to be thrombocytopenic with elevated INR in the setting of rectus sheath hematoma. GI was consulted   Assessment & Plan:   Principal Problem:   Acute blood loss anemia Active Problems:   Thrombocytopenia ([place])   Hypokalemia   Coronary artery disease involving native coronary artery of native heart without angina pectoris   Hepatic encephalopathy (HCC)   Rectus sheath hematoma   Alcoholic cirrhosis of liver with ascites (HCC)   Chronic kidney disease, stage 3a (HCC)   History of alcohol abuse   Coagulopathy (HCC)   Hypomagnesemia   Chronic combined systolic (congestive) and diastolic (congestive) heart failure (HCC)   AF (paroxysmal atrial fibrillation) (HCC)   Prolonged QT interval  1 acute blood loss anemia secondary to rectus sheath hematoma in the setting of coagulopathy and thrombocytopenia -CT abdomen and pelvis revealed enlarging rectus sheath hematoma despite embolization at [facility] last week.- -Patient noted to have a significant drop in hemoglobin on presentation at 6.6 from 7.8 on discharge from [facility]. -Patient placed on vitamin K with INR trending down currently at 2.5. -Status post transfusion 1 unit packed red blood cells, 1 unit of platelets, 2 units of cryoprecipitate at time of  presentation. -Hemoglobin currently stable at 8.2 with a platelet count of 69. -Dr. Cyd Silence discussed CT findings with Dr. Anselm Pancoast with IR who did not feel that current hematoma is expansive enough to warrant intervention especially with recent intervention that was done. -GI following and recommended continued supportive care with outpatient follow-up with [facility] hepatology.  2.  Hypokalemia -Patient noted to have recurrent severe hypokalemia. -Potassium currently at 3.4. -Spironolactone resumed.   -Repeat labs in the morning.  3.  Coronary artery disease -Currently chest pain-free. -Not on antiplatelet therapy due to coagulopathy. -Continue spironolactone.  4.  Hepatic encephalopathy -Improved clinically on lactulose.   -Lactulose decreased to twice daily per GI with goal of 2-3 bowel movements per day.  5.  Decompensated alcoholic cirrhosis of the liver with ascites -Patient with advanced disease with numerous complications including hepatic encephalopathy, chronic thrombocytopenia, coagulopathy -MELD score of 33, suggestive of 52.6% 76-monthmortality. -[facility] -PT consulted. -GI not recommending paracentesis at this time. -Continue SBP prophylaxis with IV Rocephin 1 g daily. -Continue 2 g sodium restricted diet. -Spironolactone resumed. -Patient received a dose of Lasix 40 mg IV x1 after being given IV albumin yesterday evening with good urine output and improvement with abdominal distention and lower extremity edema.   -Patient stated had a urine output of close to 2 L however 800 cc recorded.  -Patient seen by palliative care. -[facility] -GI was following but have signed off.  6.  Chronic kidney disease stage IIIa -Renal function close to baseline. -Minimize nephrotoxic agents.   -Strict I's and O's.   -Follow closely on spironolactone.  7.  History of alcohol abuse -Patient noted to have been  abstinent from drinking since the  holidays. -Continue to recommend complete abstinence.  8.  Chronic combined systolic and diastolic heart failure -Noted most of patient's anasarca/volume overload secondary to low oncotic pressure from liver disease and not due to heart failure -Patient's abdominal distention and ascites improved after dose of IV albumin followed by 40 of Lasix IV x1 yesterday with a urine output recorded of 800 cc however patient states had a urine output of approximately 2 L.  -Continue spironolactone.  -Follow.   9.  Atrial fibrillation with RVR -Patient noted to go in A. fib with RVR overnight with heart rates in the 160s to 170s received a total of 7.5 mg IV Lopressor.  Patient's home regimen Coreg resumed this morning.  Patient still in A. fib with RVR with heart rates ranging in the 130s to the 160s.  Asymptomatic.   -Cycle cardiac enzymes x2, check a 2D echo.   -K. Dur 40 mEq p.o. x1. -IV Lopressor 5 mg IV x1.   -If rate is not controlled and blood pressure is still soft will place on amiodarone and consult with cardiology for further evaluation and management. -Normal saline 250 cc bolus x1. -Not on anticoagulation due to coagulopathy.  Addendum 8:25 AM -Patient still in A. fib with heart rates in the 120s to 130s with blood pressure of 92/62. -We will start amiodarone drip. -Consult with cardiology. -Transfer to stepdown unit.  10.  Prolonged QT interval -Patient noted to have a markedly prolonged QT interval on admission likely felt exacerbated by electrolyte abnormalities. -Repeat EKG with resolution of QT prolongation. -Currently on IV Rocephin for SBP prophylaxis and ciprofloxacin on hold. -Likely resume psychotropic medications on discharge.  11.  Hypokalemia -Likely secondary to diuretics. -K. Dur 40 mEq p.o. x1.     DVT prophylaxis: INR supratherapeutic at 2.5 Code Status: DNR Family Communication: Updated patient.  No family at bedside.   Disposition:   Status is: Inpatient    Dispo: The patient is from: Home              Anticipated d/c is to: Home with hospice following.              Patient currently with rectus sheath hematoma, H&H being monitored, decompensated cirrhosis, now in A. fib with RVR, not stable for discharge.   Difficult to place patient no       Consultants:   Palliative care: Dr. Rowe Pavy 04/22/2020  Gastroenterology: Dr. Michail Sermon 04/21/2020  Procedures:   CT abdomen and pelvis 04/21/2020  Transfusion of 2 units cryoprecipitate 04/21/2020  Transfusion 1 unit packed red blood cells 04/22/2018  Transfusion 1 unit platelets 04/21/2020  Antimicrobials:   IV Rocephin 04/21/2020 >>>>     Subjective: Per RN patient's heart rate went into the 160s to 170s overnight, patient however asymptomatic.  Patient given 7.5 mg of IV Lopressor overnight and also given some oral Coreg.  Patient this morning noted still to be in A. fib with heart rates in the 130s to the 140s however asymptomatic.  Patient denies any chest pain.  No shortness of breath.  No abdominal pain.  No palpitations.  Stated had good urine output of approximately 2 L overnight after being given IV Lasix and albumin. Rapid response at bedside.  Objective: Vitals:   04/24/20 0500 04/24/20 0516 04/24/20 0645 04/24/20 0722  BP:  105/73 117/72 94/62  Pulse:  (!) 120 (!) 134 (!) 138  Resp:  (!) 21 20 20   Temp:  97.9 F (36.6 C)  98.3 F (36.8  C)  TempSrc:  Oral  Oral  SpO2:  96% 94% 93%  Weight: 81.3 kg     Height:        Intake/Output Summary (Last 24 hours) at 04/24/2020 0753 Last data filed at 04/24/2020 0400 Gross per 24 hour  Intake 300 ml  Output 800 ml  Net -500 ml   Filed Weights   04/21/20 0700 04/23/20 2048 04/24/20 0500  Weight: 80.1 kg 84 kg 81.3 kg    Examination:  General exam: : NAD Respiratory system: Lungs clear auscultation bilaterally, no wheezes, no crackles, no rhonchi.  Speaking in full sentences.   Normal respiratory effort.  Cardiovascular system: Irregularly irregular.  No JVD.  No lower extremity edema.  Gastrointestinal system: Abdomen soft, nontender, nondistended, positive bowel sounds.  No rebound.  No guarding.  Ecchymosis noted over mid to left abdomen. Central nervous system: Alert and oriented. No focal neurological deficits. Extremities: Symmetric 5 x 5 power. Skin: No rashes, lesions or ulcers Psychiatry: Judgement and insight appear normal. Mood & affect appropriate.   Data Reviewed: I have personally reviewed following labs and imaging studies  CBC: Recent Labs  Lab 04/20/20 2351 04/21/20 0948 04/21/20 1619 04/21/20 2157 04/22/20 0217 04/23/20 0535 04/24/20 0507  WBC 13.0*   < > 9.3 9.3 9.3 9.0 8.5  NEUTROABS 10.3*  --  6.6  --   --   --  5.4  HGB 6.6*   < > 7.9* 8.1* 7.8* 8.0* 8.2*  HCT 20.0*   < > 24.3* 25.3* 24.4* 25.1* 25.1*  MCV 108.1*   < > 105.7* 108.1* 107.0* 110.6* 108.7*  PLT 47*   < > 77* 71* 71* 70* 69*   < > = values in this interval not displayed.    Basic Metabolic Panel: Recent Labs  Lab 04/20/20 2351 04/21/20 0948 04/22/20 0217 04/23/20 0535 04/24/20 0507  NA 133* 133* 138 134* 135  K 2.8* 3.9 3.7 3.9 3.4*  CL 111 110 113* 112* 111  CO2 17* 17* 16* 18* 19*  GLUCOSE 87 89 105* 106* 106*  BUN 14 16 12 12 12   CREATININE 1.18* 0.81 1.11* 1.04* 1.17*  CALCIUM 8.5* 9.3 9.6 9.3 9.2  MG 1.6* 2.2  --   --  2.0    GFR: Estimated Creatinine Clearance: 54.4 mL/min (A) (by C-G formula based on SCr of 1.17 mg/dL (H)).  Liver Function Tests: Recent Labs  Lab 04/20/20 2351 04/21/20 0948 04/22/20 0217 04/23/20 0535 04/24/20 0507  AST 66* 83* 73* 73* 65*  ALT 33 39 40 42 35  ALKPHOS 170* 194* 197* 213* 186*  BILITOT 12.3* 14.4* 13.6* 12.1* 12.7*  PROT 4.0* 4.8* 5.0* 4.9* 4.9*  ALBUMIN 1.7* 2.1* 2.2* 2.0* 2.3*    CBG: No results for input(s): GLUCAP in the last 168 hours.   Recent Results (from the past 240 hour(s))  SARS  CORONAVIRUS 2 (TAT 6-24 HRS) Nasopharyngeal Nasopharyngeal Swab     Status: None   Collection Time: 04/21/20  5:28 AM   Specimen: Nasopharyngeal Swab  Result Value Ref Range Status   SARS Coronavirus 2 NEGATIVE NEGATIVE Final    Comment: (NOTE) SARS-CoV-2 target nucleic acids are NOT DETECTED.  The SARS-CoV-2 RNA is generally detectable in upper and lower respiratory specimens during the acute phase of infection. Negative results do not preclude SARS-CoV-2 infection, do not rule out co-infections with other pathogens, and should not be used as the sole basis for treatment or other patient management decisions. Negative results must be  combined with clinical observations, patient history, and epidemiological information. The expected result is Negative.  Fact Sheet for Patients: SugarRoll.be  Fact Sheet for Healthcare Providers: https://www.woods-mathews.com/  This test is not yet approved or cleared by the [place] FDA and  has been authorized for detection and/or diagnosis of SARS-CoV-2 by FDA under an Emergency Use Authorization (EUA). This EUA will remain  in effect (meaning this test can be used) for the duration of the COVID-19 declaration under Se ction 564(b)(1) of the Act, 21 U.S.C. section 360bbb-3(b)(1), unless the authorization is terminated or revoked sooner.  Performed at [facility], [place] [address], [place], [place] [address]   MRSA PCR Screening     Status: Abnormal   Collection Time: 04/21/20  6:25 AM   Specimen: Nasal Mucosa; Nasopharyngeal  Result Value Ref Range Status   MRSA by PCR POSITIVE (A) NEGATIVE Final    Comment:        The GeneXpert MRSA Assay (FDA approved for NASAL specimens only), is one component of a comprehensive MRSA colonization surveillance program. It is not intended to diagnose MRSA infection nor to guide or monitor treatment for MRSA infections. RESULT CALLED TO, READ BACK BY  AND VERIFIED WITH: CAUDLE,L. RN AT 1127 04/21/20 MULLINS,T Performed at [facility], [place] [address], [place], [place] [address]          Radiology Studies: No results found.      Scheduled Meds: . carvedilol  3.125 mg Oral BID WC  . Chlorhexidine Gluconate Cloth  6 each Topical Daily  . FLUoxetine  60 mg Oral q morning  . lactulose  30 g Oral BID  . mouth rinse  15 mL Mouth Rinse BID  . metoprolol tartrate  5 mg Intravenous Once  . mupirocin ointment  1 application Nasal BID  . potassium chloride  40 mEq Oral Once  . spironolactone  100 mg Oral Daily  . zinc sulfate  220 mg Oral Daily   Continuous Infusions: . cefTRIAXone (ROCEPHIN)  IV Stopped (04/23/20 1430)     LOS: 3 days    Time spent: 40 minutes    Irine Seal, MD [facility]   To contact the attending provider between 7A-7P or the covering provider during after hours 7P-7A, please log into the web site [email or web address] and access using universal [place] password for that web site. If you do not have the password, please call the hospital operator.  04/24/2020, 7:53 AM

## 2020-04-24 NOTE — Progress Notes (Signed)
   04/24/20 0722  Assess: MEWS Score  Temp 98.3 F (36.8 C)  BP 94/62  Pulse Rate (!) 138  Resp 20  SpO2 93 %  O2 Device Room Air  Assess: MEWS Score  MEWS Temp 0  MEWS Systolic 1  MEWS Pulse 3  MEWS RR 0  MEWS LOC 0  MEWS Score 4  MEWS Score Color Red  Assess: if the MEWS score is Yellow or Red  Were vital signs taken at a resting state? Yes  Focused Assessment No change from prior assessment  Early Detection of Sepsis Score *See Row Information* Low  MEWS guidelines implemented *See Row Information* Yes  Treat  MEWS Interventions Administered scheduled meds/treatments;Escalated (See documentation below) (coreg given at [phone])  Pain Scale 0-10  Pain Score 0  Take Vital Signs  Increase Vital Sign Frequency  Red: Q 1hr X 4 then Q 4hr X 4, if remains red, continue Q 4hrs  Escalate  MEWS: Escalate Red: discuss with charge nurse/RN and provider, consider discussing with RRT  Notify: Charge Nurse/RN  Name of Charge Nurse/RN Notified [place]  Date Charge Nurse/RN Notified 04/24/20  Time Charge Nurse/RN Notified 8675  Notify: Provider  Provider Name/Title Dr. Grandville Silos  Date Provider Notified 04/24/20  Time Provider Notified 0725  Notification Type Page  Notification Reason Change in status (Rapid response for afib and rvr)  Provider response See new orders  Date of Provider Response 04/24/20  Time of Provider Response 0740  Notify: Rapid Response  Name of Rapid Response RN Notified Alexis RN  Date Rapid Response Notified 04/24/20  Time Rapid Response Notified 4492  Document  Patient Outcome Transferred/level of care increased  Progress note created (see row info) Yes

## 2020-04-24 NOTE — TOC Progression Note (Signed)
Transition of Care [facility]) - Progression Note    Patient Details  Name: GINIA RUDELL MRN: [id number] Date of Birth: May 13, 1955  Transition of Care [facility] CM/SW [place], [place] Phone Number: 04/24/2020, 8:44 AM  Clinical Narrative:   Patient seen in follow up to PT recommendation of [facility].  Found Ms Stong sitting up on side of the bed, with her back turned to her ex-husband who was seated by the window.  She introduced him as "ex, and good support."  Also cited daughter as support.  She states she already has home health working with her-they just started.  She thought the name was [facility].  Has all needed DME at home.  Texted [place] with [facility] who confirmed they are providing PT and aide services.  Will need resume orders at d/c.  TOC will continue to follow during the course of hospitalization.     Expected Discharge Plan: [place] Barriers to Discharge: Continued Medical Work up  Expected Discharge Plan and Services Expected Discharge Plan: [place]   Discharge Planning Services: CM Consult   Living arrangements for the past 2 months: Apartment                                       Social Determinants of Health (SDOH) Interventions    Readmission Risk Interventions Readmission Risk Prevention Plan 08/14/2019 12/11/2018  Transportation Screening Complete Complete  PCP or Specialist Appt within 3-5 Days Complete Complete  HRI or [place] Complete Complete  Social Work Consult for [place] Planning/Counseling Complete Complete  Palliative Care Screening Not Applicable Not Applicable  Medication Review Press photographer) Complete Referral to Pharmacy  Some recent data might be hidden

## 2020-04-24 NOTE — Progress Notes (Signed)
  Echocardiogram 2D Echocardiogram has been performed.  Dana Watkins 04/24/2020, 2:51 PM

## 2020-04-24 NOTE — Significant Event (Signed)
Rapid Response Event Note   Reason for Call :  Floor nurse called due to sustained a-fib in 120's-140's and borderline blood pressures   Initial Focused Assessment: upon assessment, patient is resting in bed with eyes closed. Awakens to voice, alert and oriented. Patient does not endorse any feeling of fluttering in chest or rapid heart rate. States that she feels fine. Set patient up on monitor and found patient to be in apparent a-fib w/ HR as 146. Initial blood pressure 99/66 (71).      Interventions:   Dr. Grandville Silos notified. Patient had recently had coreg which seemed to be ineffective. Additional order for 5 mg IV lopressor obtained. Instructed to obtain EKG. EKG showing HR in 130's w/ a-flutter. MD given EKG. Lopressor administered over slow push. HR remains elevated, BP softer after lopressor administration falling to 85/62 (71). HR remains in 130's. MD notified and instructed to administer 250 NS bolus. Bolus administered. Repeat BP after bolus 92/62 (69), HR 127. MD notified that patient's rate was still irregular. Cardiology consulted by Dr. Grandville Silos. Instructed to start amiodarone drip without giving a bolus since patient was asymptomatic. Transfer pt to SDU.   Plan of Care:  Patient transferred to step-down unit for closer monitoring and amiodarone drip.    Event Summary:   MD Notified: Dr. Grandville Silos @ [place] Call Time: 7034 Arrival Time: 0727 End Time: 0900  Clarene Critchley, RN

## 2020-04-24 NOTE — Care Management Important Message (Signed)
Important Message  Patient Details IM Letter given to the Patient Name: Dana Watkins MRN: [id number] Date of Birth: 03/01/1955   Medicare Important Message Given:  Yes     [name] 04/24/2020, 8:46 AM

## 2020-04-24 NOTE — Treatment Plan (Signed)
Reviewed serial chest x-rays.  Reviewed serial pulmonary notes.  Seen at hemothorax 07/2019 chest tube was placed.  She had a repeat thoracentesis 08/2019 that was transudate.  Review of serial chest x-rays reveals intermittent presence of left pleural effusion.  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  Discussed with patient.  She is asymptomatic.  No role for thoracentesis at this time.  We will arrange outpatient follow-up with her pulmonary provider to check on symptoms in the coming weeks.  Can always reevaluate the role of thoracentesis at any time.

## 2020-04-24 NOTE — Progress Notes (Signed)
Patient's daughter-Gracie Stoffel- notified of transfer to ICU/stepdown unit.

## 2020-04-24 NOTE — Consult Note (Signed)
Cardiology Consultation:   Patient ID: JERMIAH HOWTON MRN: [id number]; DOB: 1955-02-07  Admit date: 04/20/2020 Date of Consult: 04/24/2020  PCP:  Lawerance Cruel, [facility]  Cardiologist:  Ena Dawley, MD  Advanced Practice Provider:  No care team member to display Electrophysiologist:  None        Patient Profile:   Dana Watkins is a 65 y.o. female with a hx of liver cirrhosis who is being seen today for the evaluation of atrial fibrillation at the request of Dr. Grandville Silos.  History of Present Illness:   Dana Watkins 65 year old female with chronic liver dysfunction, cirrhosis from alcohol abuse with mildly reduced ejection fraction of 40 to 45%, atrial fibrillation with rapid ventricular response, anemia secondary to rectus sheath hematoma, ascites with challenging to control heart rates with atrial fibrillation.  Notes reviewed.  See below for details.  Currently in the ICU, eyes are closed but she is able to answer some questions.  Daughter at bedside.  Heart rate much better controlled currently at approximately 100-110  Past Medical History:  Diagnosis Date  . Acute hypokalemia 11/26/2014  . Arthritis 05/15/2015  . Benign essential hypertension 05/15/2015  . C. difficile colitis   . CAD (coronary artery disease)    a. cath 04/2017: ""Diffuse, calcific CAD particularly in the distal RCA and proximal to mid LAD.  LAD disease is nonobstructive.  RCA disease is more severe but does not appear significant.  Given her lack of symptoms, would pursue medical therapy."  . CKD (chronic kidney disease), stage III ([place])   . Hypokalemia   . Migraine headache 05/15/2015  . Mild mitral regurgitation   . Pancytopenia ([place])   . Sepsis ([place]) 01/2017  . Thrombocytopenia ([place])    a. chronic thrombocytopenia ([facility] - remotely saw hematology).  . Vitamin D deficiency     Past Surgical History:  Procedure Laterality Date  . CESAREAN SECTION     24 years  ago  . ESOPHAGOGASTRODUODENOSCOPY (EGD) WITH PROPOFOL N/A 06/13/2019   Procedure: ESOPHAGOGASTRODUODENOSCOPY (EGD) WITH PROPOFOL;  Surgeon: Arta Silence, MD;  Location: [facility] ENDOSCOPY;  Service: Endoscopy;  Laterality: N/A;  administration of one bag of platelets   . LEFT HEART CATH AND CORONARY ANGIOGRAPHY N/A 05/12/2017   Procedure: LEFT HEART CATH AND CORONARY ANGIOGRAPHY;  Surgeon: [name] Booze, MD;  Location: [facility];  Service: Cardiovascular;  Laterality: N/A;  . THORACENTESIS N/A 08/28/2019   Procedure: Mathews Robinsons;  Surgeon: Candee Furbish, MD;  Location: [facility] ENDOSCOPY;  Service: Pulmonary;  Laterality: N/A;  . tonsils and adneoids     as a child  . ULTRASOUND GUIDANCE FOR VASCULAR ACCESS  05/12/2017   Procedure: Ultrasound Guidance For Vascular Access;  Surgeon: [name] Booze, MD;  Location: [facility];  Service: Cardiovascular;;     Home Medications:  Prior to Admission medications   Medication Sig Start Date End Date Taking? Authorizing Provider  ciprofloxacin (CIPRO) 500 MG tablet Take 500 mg by mouth at bedtime. 04/18/20  Yes [provider]  FLUoxetine (PROZAC) 20 MG capsule Take 60 mg by mouth every morning. 04/16/20  Yes [provider]  ibuprofen (ADVIL) 200 MG tablet Take 200 mg by mouth every 6 (six) hours as needed for fever, mild pain or headache.   Yes [provider]  lactulose (CHRONULAC) 10 GM/15ML solution Take 15-30 mLs by mouth 3 (three) times daily as needed for constipation. 03/21/20  Yes [provider]  Morphine Sulfate (MORPHINE CONCENTRATE) 10 mg / 0.5 ml concentrated solution Take 10 mg by mouth every 2 (two) hours as needed for pain. 04/08/20  Yes [provider]  ondansetron (ZOFRAN-ODT) 4 MG disintegrating tablet Take 4 mg by mouth 3 (three) times daily as needed for nausea/vomiting. 03/06/20  Yes [provider]  polyvinyl alcohol (LIQUIFILM TEARS) 1.4 % ophthalmic solution Place  1 drop into both eyes as needed for dry eyes.   Yes [provider]  spironolactone (ALDACTONE) 100 MG tablet Take 100 mg by mouth daily. 04/18/20  Yes [provider]  zinc sulfate 220 (50 Zn) MG capsule Take 220 mg by mouth daily. 04/19/20 04/19/21 Yes [provider]  carvedilol (COREG) 3.125 MG tablet Take 1 tablet (3.125 mg total) by mouth 2 (two) times daily with a meal. Patient not taking: No sig reported 12/25/19   [name], MD  Multiple Vitamin (MULTIVITAMIN WITH MINERALS) TABS tablet Take 1 tablet by mouth daily. Patient not taking: Reported on 04/21/2020 06/29/18   Raiford Noble Latif, DO  potassium chloride SA (KLOR-CON) 20 MEQ tablet Take 1 tablet (20 mEq total) by mouth 2 (two) times daily. Patient not taking: No sig reported 08/14/19   Dixie Dials, MD    Inpatient Medications: Scheduled Meds: . carvedilol  3.125 mg Oral BID WC  . Chlorhexidine Gluconate Cloth  6 each Topical Daily  . lactulose  30 g Oral BID  . mouth rinse  15 mL Mouth Rinse BID  . mupirocin ointment  1 application Nasal BID  . [START ON 04/25/2020] spironolactone  100 mg Oral Daily  . zinc sulfate  220 mg Oral Daily   Continuous Infusions: . amiodarone 30 mg/hr (04/24/20 0941)  . cefTRIAXone (ROCEPHIN)  IV Stopped (04/23/20 1404)   PRN Meds: alum & mag hydroxide-simeth, hydrOXYzine, morphine injection, oxyCODONE, polyvinyl alcohol  Allergies:    Allergies  Allergen Reactions  . Sulfa Antibiotics Nausea And Vomiting, Rash and Shortness Of Breath    Patient reports reaction of mild and took place a very long time ago.    Social History:   Social History   Socioeconomic History  . Marital status: Divorced    Spouse name: Not on file  . Number of children: Not on file  . Years of education: Not on file  . Highest education level: Not on file  Occupational History  . Not on file  Tobacco Use  . Smoking status: Former Smoker    Years: 2.00    Start date: 06/25/1968     Quit date: 05/16/2014    Years since quitting: 5.9  . Smokeless tobacco: Never Used  Vaping Use  . Vaping Use: Never used  Substance and Sexual Activity  . Alcohol use: Yes    Comment: occ  . Drug use: No  . Sexual activity: Not on file  Other Topics Concern  . Not on file  Social History Narrative   Lives with husband  In home.  Has seasonal allergies.  Drinks coffee, 1 cup AM and 3 plus sweet tea.  Has 3 grown kids.     Social Determinants of Health   Financial Resource Strain: Not on file  Food Insecurity: Not on file  Transportation Needs: Not on file  Physical Activity: Not on file  Stress: Not on file  Social Connections: Not on file  Intimate Partner Violence: Not on file    Family History:    Family History  Problem Relation Age of Onset  .  Pernicious anemia Maternal Grandfather   . Heart attack Maternal Grandfather   . Atrial fibrillation Mother   . Supraventricular tachycardia Father   . Heart disease Paternal Grandfather      ROS:  Please see the history of present illness.   All other ROS reviewed and negative.     Physical Exam/Data:   Vitals:   04/24/20 0930 04/24/20 1000 04/24/20 1030 04/24/20 1100  BP: (!) 88/62 99/75 102/63 123/78  Pulse: (!) 116 (!) 111 (!) 111 (!) 109  Resp: 20 (!) 22 (!) 21 17  Temp:      TempSrc:      SpO2: 99% 99% 98% 95%  Weight:      Height:        Intake/Output Summary (Last 24 hours) at 04/24/2020 1135 Last data filed at 04/24/2020 0941 Gross per 24 hour  Intake 110.63 ml  Output 800 ml  Net -689.37 ml   Last 3 Weights 04/24/2020 04/24/2020 04/23/2020  Weight (lbs) 170 lb 10.2 oz 179 lb 3.7 oz 185 lb 3 oz  Weight (kg) 77.4 kg 81.3 kg 84 kg     Body mass index is 25.95 kg/m.  General: Ill-appearing, jaundice HEENT: Scleral icterus Lymph: no adenopathy Neck: no JVD Endocrine:  No thryomegaly Vascular: No carotid bruits; FA pulses 2+ bilaterally without bruits  Cardiac:  normal S1, S2; irreg tachy; no  murmur  Lungs:  clear to auscultation bilaterally, no wheezing, rhonchi or rales  Abd: soft, nontender, no hepatomegaly  Ext: no edema Musculoskeletal:  No deformities, BUE and BLE strength normal and equal Skin: warm and dry  Neuro:  CNs 2-12 intact, no focal abnormalities noted Psych:  Normal affect   EKG:  The EKG was personally reviewed and demonstrates: 04/24/2020 at 7:51 AM-atrial fibrillation heart rate 131 bpm  Telemetry:  Telemetry was personally reviewed and demonstrates: Atrial fibrillation currently 100-110  Relevant CV Studies: Echocardiogram 08/07/2019: - EF 40 to 45%  Laboratory Data:  High Sensitivity Troponin:   Recent Labs  Lab 04/24/20 0507 04/24/20 1025  TROPONINIHS 27* 32*     Chemistry Recent Labs  Lab 04/22/20 0217 04/23/20 0535 04/24/20 0507  NA 138 134* 135  K 3.7 3.9 3.4*  CL 113* 112* 111  CO2 16* 18* 19*  GLUCOSE 105* 106* 106*  BUN 12 12 12   CREATININE 1.11* 1.04* 1.17*  CALCIUM 9.6 9.3 9.2  GFRNONAA 56* >60 52*  ANIONGAP 9 4* 5    Recent Labs  Lab 04/22/20 0217 04/23/20 0535 04/24/20 0507  PROT 5.0* 4.9* 4.9*  ALBUMIN 2.2* 2.0* 2.3*  AST 73* 73* 65*  ALT 40 42 35  ALKPHOS 197* 213* 186*  BILITOT 13.6* 12.1* 12.7*   Hematology Recent Labs  Lab 04/22/20 0217 04/23/20 0535 04/24/20 0507  WBC 9.3 9.0 8.5  RBC 2.28* 2.27* 2.31*  HGB 7.8* 8.0* 8.2*  HCT 24.4* 25.1* 25.1*  MCV 107.0* 110.6* 108.7*  MCH 34.2* 35.2* 35.5*  MCHC 32.0 31.9 32.7  RDW 25.2* 26.2* 26.2*  PLT 71* 70* 69*   BNPNo results for input(s): BNP, PROBNP in the last 168 hours.  DDimer No results for input(s): DDIMER in the last 168 hours.   Radiology/Studies:  CT ABDOMEN PELVIS W CONTRAST  Result Date: 04/21/2020 CLINICAL DATA:  Nausea, vomiting, abdominal distension EXAM: CT ABDOMEN AND PELVIS WITH CONTRAST TECHNIQUE: Multidetector CT imaging of the abdomen and pelvis was performed using the standard protocol following bolus administration of  intravenous contrast. CONTRAST:  162m OMNIPAQUE IOHEXOL  300 MG/ML  SOLN COMPARISON:  06/25/2018 FINDINGS: Lower chest: Small to moderate left pleural effusion has developed, incompletely evaluated, with compressive atelectasis of the left lower lobe. Mild atelectasis or infiltrate within the visualized right middle lobe. Extensive right coronary artery calcification. Global cardiac size within normal limits. Hepatobiliary: Progressive changes of cirrhosis with increasing nodularity of the liver surface, decreasing overall liver volume, enlargement of portosystemic vascular collaterals, and development of moderate ascites. Multiple hepatic cysts are again identified, better assessed on sonogram of 02/21/2020. No enhancing liver mass identified. No intra or extrahepatic biliary ductal dilation. Cholelithiasis noted without definite CT evidence of acute cholecystitis. Pancreas: Unremarkable Spleen: Mild splenomegaly is again identified, stable since prior examination. No intrasplenic mass identified. The splenic vein is patent. Adrenals/Urinary Tract: The adrenal glands are unremarkable. The kidneys are normal in size and position. Multiple simple cortical cysts are seen bilaterally. 3 mm nonobstructing calculus is seen within the lower pole of the left kidney. No hydronephrosis. No ureteral calculi. The bladder is unremarkable. Stomach/Bowel: The stomach and small bowel are unremarkable. There is marked circumferential wall thickening involving the cecum and ascending colon which may reflect changes of portal colopathy, though a superimposed infectious or inflammatory ascending colitis could appear similarly. No evidence of obstruction. No free intraperitoneal gas. Moderate ascites. Vascular/Lymphatic: Progressive dilation of the a portal vein likely reflective of increasing portal venous pressure. Prominent gastroesophageal varices are again identified. Recanalization of the umbilical vein again noted. Since the  prior examination, embolization coils are identified within the inferior epigastric artery as well as high attenuation intraluminal material likely representing residual glue or Onyx. There is moderate aortoiliac atherosclerotic calcification. No aortic aneurysm. No pathologic adenopathy within the abdomen and pelvis. Reproductive: Multiple enhancing nodules within the uterus likely representing multiple underlying uterine fibroids. The pelvic organs are otherwise unremarkable. Other: There is enlargement of the right rectus sheath in keeping with the given history of a rectus hematoma. There is moderate diffuse body wall subcutaneous edema. Tiny fat containing umbilical hernia. Musculoskeletal: Subacute fractures of the right superior and inferior pubic rami and right sacral ala are identified. Remote healed fracture of the left inferior pubic ramus noted. Severe T11 compression fracture has developed, age indeterminate, with near vertebral plana deformity and mild retropulsion of the superior posterior aspect of the T11 vertebral body resulting in moderate central canal stenosis. IMPRESSION: Enlargement of the a right rectus sheath in keeping with a rectus hematoma. Embolization of the right inferior epigastric artery. No evidence of active extravasation. Progressive changes of cirrhosis and portal venous hypertension with enlarging portal venous and portosystemic collateral vasculature, progressive volume loss of the liver, and development of moderate ascites. Interval development of marked circumferential wall thickening of the cecum and ascending colon suggestive of a portal colopathy, though infectious or inflammatory ascending colitis could appear similarly. Interval development of moderate anasarca with left pleural effusion, diffuse body wall subcutaneous edema and ascites. Severe wedge compression deformity of T11 with mild retropulsion, age indeterminate. This was not seen on prior CT examination of the  chest of 10/17/2019. Correlation for point tenderness is recommended. This could be better assessed with MRI examination, if indicated. Additional subacute fractures of the right superior and inferior pubic rami as well as the right sacral ala. Cholelithiasis. Minimal left nephrolithiasis. Aortic Atherosclerosis (ICD10-I70.0). Electronically Signed   By: Fidela Salisbury MD   On: 04/21/2020 03:33     Assessment and Plan:   Atrial fibrillation with rapid response - Asymptomatic, no chest discomfort no  fluttering type feeling.  Heart rate was 146 with blood pressure 99/66. - She was initially given carvedilol but this seemed to be ineffective, additional IV metoprolol was obtained, heart rate in the 130s with atrial flutter.  Blood pressure fell to 85/62 with heart rate in the 130s, saline bolus was administered.  We were then consulted. - Dr. Grandville Silos instructed to start amiodarone drip.  Perfect. -Avoid anticoagulation in the future given her high risk state. -I will stop carvedilol 3.125 mg twice a day.  Mildly elevated troponin - 27, 32-flat.  Demand ischemia in the setting of underlying medical condition, atrial fibrillation.  Anemia - Ranging from 7.8-8.2.  Per primary team.  Alcoholic liver cirrhosis - Hepatic encephalopathy chronic thrombocytopenia, hypotension.  Challenging situation.  Needed to utilize amiodarone which is not optimal in the setting of underlying liver disease.  Rectus sheath hematoma - Coagulopathy.  Hemoglobin 6.6 on admission.  Vitamin K.  Transfusion.  Follows with [facility].  Chronic systolic heart failure - Mild to moderately reduced EF of 40 to 45%.  Majority of her ascites is secondary to liver dysfunction.  Low oncotic pressure.  Agree with Dr. Grandville Silos.  On spironolactone.  CAD - Cardiac catheterization report reviewed as above.  Resulted in medical therapy.  No PCI.  CRITICAL CARE Performed by: Candee Furbish   Total critical care time: 40  minutes  Critical care time was exclusive of separately billable procedures and treating other patients.  Critical care was necessary to treat or prevent imminent or life-threatening deterioration.  Critical care was time spent personally by me on the following activities: development of treatment plan with patient and/or surrogate as well as nursing, discussions with consultants, evaluation of patient's response to treatment, examination of patient, obtaining history from patient or surrogate, ordering and performing treatments and interventions, ordering and review of laboratory studies, ordering and review of radiographic studies, pulse oximetry and re-evaluation of patient's condition.  For questions or updates, please contact [place] Please consult [email or web address] for contact info under    Signed, Candee Furbish, MD  04/24/2020 11:35 AM

## 2020-04-24 NOTE — Plan of Care (Signed)
RN will continue to monitor patient's progression of care plan.

## 2020-04-24 NOTE — Progress Notes (Signed)
  MEWS Score: Yellow Patient on afib. Camera operator and rapid nurse notified. MD notified.    04/24/20 0117  Assess: MEWS Score  Temp 98.6 F (37 C)  BP (!) 141/88  Pulse Rate (!) 169  Level of Consciousness Alert  SpO2 96 %  O2 Device Room Air  Assess: MEWS Score  MEWS Temp 0  MEWS Systolic 0  MEWS Pulse 3  MEWS RR 0  MEWS LOC 0  MEWS Score 3  MEWS Score Color Yellow  Assess: if the MEWS score is Yellow or Red  Were vital signs taken at a resting state? Yes  Focused Assessment No change from prior assessment  Early Detection of Sepsis Score *See Row Information* Low  MEWS guidelines implemented *See Row Information* Yes  Treat  Pain Scale 0-10  Pain Score 0  Take Vital Signs  Increase Vital Sign Frequency  Yellow: Q 2hr X 2 then Q 4hr X 2, if remains yellow, continue Q 4hrs  Escalate  MEWS: Escalate Yellow: discuss with charge nurse/RN and consider discussing with provider and RRT  Notify: Charge Nurse/RN  Name of Charge Nurse/RN Notified Tom, RN  Date Charge Nurse/RN Notified 04/24/20  Time Charge Nurse/RN Notified 0111  Document  Patient Outcome Other (Comment) (still on afib)  Progress note created (see row info) Yes

## 2020-04-25 ENCOUNTER — Encounter (HOSPITAL_COMMUNITY): Payer: Self-pay | Admitting: Internal Medicine

## 2020-04-25 ENCOUNTER — Inpatient Hospital Stay (HOSPITAL_COMMUNITY): Payer: Medicare HMO

## 2020-04-25 DIAGNOSIS — K7031 Alcoholic cirrhosis of liver with ascites: Secondary | ICD-10-CM

## 2020-04-25 DIAGNOSIS — R29898 Other symptoms and signs involving the musculoskeletal system: Secondary | ICD-10-CM

## 2020-04-25 LAB — TYPE AND SCREEN
ABO/RH(D): O POS
Antibody Screen: NEGATIVE
Unit division: 0
Unit division: 0

## 2020-04-25 LAB — CBC WITH DIFFERENTIAL/PLATELET
Abs Immature Granulocytes: 0.04 10*3/uL (ref 0.00–0.07)
Basophils Absolute: 0.1 10*3/uL (ref 0.0–0.1)
Basophils Relative: 1 %
Eosinophils Absolute: 0.3 10*3/uL (ref 0.0–0.5)
Eosinophils Relative: 3 %
HCT: 28.4 % — ABNORMAL LOW (ref 36.0–46.0)
Hemoglobin: 9 g/dL — ABNORMAL LOW (ref 12.0–15.0)
Immature Granulocytes: 0 %
Lymphocytes Relative: 17 %
Lymphs Abs: 1.7 10*3/uL (ref 0.7–4.0)
MCH: 35.4 pg — ABNORMAL HIGH (ref 26.0–34.0)
MCHC: 31.7 g/dL (ref 30.0–36.0)
MCV: 111.8 fL — ABNORMAL HIGH (ref 80.0–100.0)
Monocytes Absolute: 1.4 10*3/uL — ABNORMAL HIGH (ref 0.1–1.0)
Monocytes Relative: 15 %
Neutro Abs: 6.1 10*3/uL (ref 1.7–7.7)
Neutrophils Relative %: 64 %
Platelets: 74 10*3/uL — ABNORMAL LOW (ref 150–400)
RBC: 2.54 MIL/uL — ABNORMAL LOW (ref 3.87–5.11)
RDW: 26.2 % — ABNORMAL HIGH (ref 11.5–15.5)
WBC: 9.5 10*3/uL (ref 4.0–10.5)
nRBC: 0 % (ref 0.0–0.2)

## 2020-04-25 LAB — COMPREHENSIVE METABOLIC PANEL
ALT: 34 U/L (ref 0–44)
AST: 60 U/L — ABNORMAL HIGH (ref 15–41)
Albumin: 2 g/dL — ABNORMAL LOW (ref 3.5–5.0)
Alkaline Phosphatase: 195 U/L — ABNORMAL HIGH (ref 38–126)
Anion gap: 5 (ref 5–15)
BUN: 13 mg/dL (ref 8–23)
CO2: 18 mmol/L — ABNORMAL LOW (ref 22–32)
Calcium: 9.1 mg/dL (ref 8.9–10.3)
Chloride: 112 mmol/L — ABNORMAL HIGH (ref 98–111)
Creatinine, Ser: 1.1 mg/dL — ABNORMAL HIGH (ref 0.44–1.00)
GFR, Estimated: 56 mL/min — ABNORMAL LOW (ref >60)
Glucose, Bld: 113 mg/dL — ABNORMAL HIGH (ref 70–99)
Potassium: 3.6 mmol/L (ref 3.5–5.1)
Sodium: 135 mmol/L (ref 135–145)
Total Bilirubin: 10.5 mg/dL — ABNORMAL HIGH (ref 0.3–1.2)
Total Protein: 4.8 g/dL — ABNORMAL LOW (ref 6.5–8.1)

## 2020-04-25 LAB — BPAM RBC
Blood Product Expiration Date: 202205092359
Blood Product Expiration Date: 202205102359
ISSUE DATE / TIME: 202204110526
Unit Type and Rh: 5100
Unit Type and Rh: 5100

## 2020-04-25 LAB — LACTATE DEHYDROGENASE: LDH: 277 U/L — ABNORMAL HIGH (ref 98–192)

## 2020-04-25 LAB — MAGNESIUM: Magnesium: 1.9 mg/dL (ref 1.7–2.4)

## 2020-04-25 MED ORDER — DULOXETINE HCL 20 MG PO CPEP
20.0000 mg | ORAL_CAPSULE | Freq: Every day | ORAL | Status: DC
  Administered 2020-04-25 – 2020-04-26 (×2): 20 mg via ORAL
  Filled 2020-04-25 (×3): qty 1

## 2020-04-25 MED ORDER — LIP MEDEX EX OINT
TOPICAL_OINTMENT | CUTANEOUS | Status: DC | PRN
  Filled 2020-04-25: qty 7

## 2020-04-25 MED ORDER — AMIODARONE HCL 200 MG PO TABS
200.0000 mg | ORAL_TABLET | Freq: Two times a day (BID) | ORAL | Status: DC
  Administered 2020-04-25 – 2020-04-26 (×3): 200 mg via ORAL
  Filled 2020-04-25 (×3): qty 1

## 2020-04-25 MED ORDER — LORAZEPAM 0.5 MG PO TABS
0.5000 mg | ORAL_TABLET | ORAL | Status: DC | PRN
  Administered 2020-04-25 – 2020-04-26 (×2): 0.5 mg via ORAL
  Filled 2020-04-25 (×2): qty 1

## 2020-04-25 MED ORDER — POTASSIUM CHLORIDE CRYS ER 20 MEQ PO TBCR
40.0000 meq | EXTENDED_RELEASE_TABLET | Freq: Once | ORAL | Status: AC
  Administered 2020-04-25: 40 meq via ORAL
  Filled 2020-04-25: qty 2

## 2020-04-25 MED ORDER — MAGNESIUM SULFATE 2 GM/50ML IV SOLN
2.0000 g | Freq: Once | INTRAVENOUS | Status: AC
  Administered 2020-04-25: 2 g via INTRAVENOUS
  Filled 2020-04-25: qty 50

## 2020-04-25 MED ORDER — SPIRONOLACTONE 25 MG PO TABS
100.0000 mg | ORAL_TABLET | Freq: Every day | ORAL | Status: DC
  Administered 2020-04-26: 100 mg via ORAL
  Filled 2020-04-25: qty 4

## 2020-04-25 MED ORDER — STROKE: EARLY STAGES OF RECOVERY BOOK
Freq: Once | Status: DC
  Filled 2020-04-25: qty 1

## 2020-04-25 MED ORDER — LORAZEPAM 1 MG PO TABS: 1.0000 mg | ORAL_TABLET | ORAL | Status: DC | PRN

## 2020-04-25 NOTE — Progress Notes (Addendum)
Progress Note  Patient Name: Dana Watkins Date of Encounter: 04/25/2020  Primary Cardiologist: Previously Dr. Meda Coffee, MD  Subjective   Feeling better today. Has converted to NSR with stable rates. Will need to discuss medication plan with MD   Inpatient Medications    Scheduled Meds: . Chlorhexidine Gluconate Cloth  6 each Topical Daily  . lactulose  30 g Oral BID  . mouth rinse  15 mL Mouth Rinse BID  . mupirocin ointment  1 application Nasal BID  . [START ON 04/26/2020] spironolactone  100 mg Oral Daily  . zinc sulfate  220 mg Oral Daily   Continuous Infusions: . amiodarone 30 mg/hr (04/25/20 0904)  . cefTRIAXone (ROCEPHIN)  IV Stopped (04/24/20 1222)   PRN Meds: alum & mag hydroxide-simeth, hydrOXYzine, LORazepam, morphine injection, oxyCODONE, polyvinyl alcohol   Vital Signs    Vitals:   04/25/20 0900 04/25/20 0907 04/25/20 1000 04/25/20 1030  BP:  114/72 (!) 120/55   Pulse: 76 79 74 76  Resp: (!) 28 (!) 26 (!) 25 (!) 28  Temp:      TempSrc:      SpO2: 99% 100% 100% 100%  Weight:      Height:        Intake/Output Summary (Last 24 hours) at 04/25/2020 1047 Last data filed at 04/25/2020 0900 Gross per 24 hour  Intake 1093.66 ml  Output 501 ml  Net 592.66 ml   Filed Weights   04/24/20 0500 04/24/20 0900 04/25/20 0500  Weight: 81.3 kg 77.4 kg 76.2 kg    Physical Exam   General: Ill appearing, NAD Neck: Negative for carotid bruits. No JVD Lungs:Clear to ausculation bilaterally.  Breathing is unlabored. Cardiovascular: RRR with S1 S2. No murmurs Abdomen: Soft, non-tender, distended. No obvious abdominal masses. Extremities: No edema. Radial pulses 2+ bilaterally Neuro: Alert and oriented. No focal deficits. No facial asymmetry. MAE spontaneously. Psych: Responds to questions appropriately with normal affect.    Labs    Chemistry Recent Labs  Lab 04/23/20 0535 04/24/20 0507 04/25/20 0234  NA 134* 135 135  K 3.9 3.4* 3.6  CL 112* 111 112*   CO2 18* 19* 18*  GLUCOSE 106* 106* 113*  BUN 12 12 13   CREATININE 1.04* 1.17* 1.10*  CALCIUM 9.3 9.2 9.1  PROT 4.9* 4.9* 4.8*  ALBUMIN 2.0* 2.3* 2.0*  AST 73* 65* 60*  ALT 42 35 34  ALKPHOS 213* 186* 195*  BILITOT 12.1* 12.7* 10.5*  GFRNONAA >60 52* 56*  ANIONGAP 4* 5 5     Hematology Recent Labs  Lab 04/23/20 0535 04/24/20 0507 04/25/20 0234  WBC 9.0 8.5 9.5  RBC 2.27* 2.31* 2.54*  HGB 8.0* 8.2* 9.0*  HCT 25.1* 25.1* 28.4*  MCV 110.6* 108.7* 111.8*  MCH 35.2* 35.5* 35.4*  MCHC 31.9 32.7 31.7  RDW 26.2* 26.2* 26.2*  PLT 70* 69* 74*    Cardiac EnzymesNo results for input(s): TROPONINI in the last 168 hours. No results for input(s): TROPIPOC in the last 168 hours.   BNPNo results for input(s): BNP, PROBNP in the last 168 hours.   DDimer No results for input(s): DDIMER in the last 168 hours.   Radiology    DG CHEST PORT 1 VIEW  Result Date: 04/24/2020 CLINICAL DATA:  Pleural effusion. EXAM: PORTABLE CHEST 1 VIEW COMPARISON:  February 20, 2020. FINDINGS: Stable cardiomediastinal silhouette. No pneumothorax is noted. Right lung is clear. Diffuse opacity of left hemithorax is noted consistent with layering effusion. Left basilar atelectasis  is present as well. Bony thorax is unremarkable. IMPRESSION: Large layering left pleural effusion is noted with associated left basilar atelectasis. Electronically Signed   By: Marijo Conception M.D.   On: 04/24/2020 16:22   ECHOCARDIOGRAM COMPLETE  Result Date: 04/24/2020    ECHOCARDIOGRAM REPORT   Patient Name:   Dana Watkins Date of Exam: 04/24/2020 Medical Rec #:  [id number]        Height:       68.0 in Accession #:    [id number]       Weight:       170.6 lb Date of Birth:  1955-06-12       BSA:          1.910 m Patient Age:    65 years         BP:           97/66 mmHg Patient Gender: F                HR:           111 bpm. Exam Location:  Inpatient Procedure: 2D Echo, Cardiac Doppler and Color Doppler Indications:    Atrial  fibrillation  History:        Patient has prior history of Echocardiogram examinations, most                 recent 08/07/2019. CHF, CAD; Arrythmias:Atrial Fibrillation.                 Alcoholic cirrhosis, anemia.  Sonographer:    Dustin Flock Referring Phys: [phone] DANIEL V THOMPSON  Sonographer Comments: Image acquisition challenging due to respiratory motion. IMPRESSIONS  1. Left ventricular ejection fraction, by estimation, is 50 to 55%. The left ventricle has low normal function. The left ventricle demonstrates regional wall motion abnormalities, possible lateral hypokinesis. Left ventricular diastolic parameters are indeterminate.  2. Right ventricular systolic function is normal. The right ventricular size is normal. There is normal pulmonary artery systolic pressure. The estimated right ventricular systolic pressure is 28.3 mmHg.  3. Left atrial size was moderately dilated.  4. The mitral valve is normal in structure. No evidence of mitral valve regurgitation. No evidence of mitral stenosis.  5. The aortic valve is tricuspid. Aortic valve regurgitation is not visualized. No aortic stenosis is present.  6. The inferior vena cava is dilated in size with >50% respiratory variability, suggesting right atrial pressure of 8 mmHg.  7. There is a substantial left pleural effusion.  8. The patient was in atrial fibrillation. FINDINGS  Left Ventricle: Left ventricular ejection fraction, by estimation, is 50 to 55%. The left ventricle has low normal function. The left ventricle demonstrates regional wall motion abnormalities. The left ventricular internal cavity size was normal in size. There is no left ventricular hypertrophy. Left ventricular diastolic parameters are indeterminate. Right Ventricle: The right ventricular size is normal. No increase in right ventricular wall thickness. Right ventricular systolic function is normal. There is normal pulmonary artery systolic pressure. The tricuspid regurgitant  velocity is 2.59 m/s, and  with an assumed right atrial pressure of 8 mmHg, the estimated right ventricular systolic pressure is 66.2 mmHg. Left Atrium: Left atrial size was moderately dilated. Right Atrium: Right atrial size was normal in size. Pericardium: There is a substantial left pleural effusion. There is no evidence of pericardial effusion. Mitral Valve: The mitral valve is normal in structure. No evidence of mitral valve regurgitation. No evidence of mitral valve stenosis. Tricuspid Valve: The  tricuspid valve is normal in structure. Tricuspid valve regurgitation is trivial. Aortic Valve: The aortic valve is tricuspid. Aortic valve regurgitation is not visualized. No aortic stenosis is present. Pulmonic Valve: The pulmonic valve was not well visualized. Pulmonic valve regurgitation is not visualized. Aorta: The aortic root is normal in size and structure. Venous: The inferior vena cava is dilated in size with greater than 50% respiratory variability, suggesting right atrial pressure of 8 mmHg. IAS/Shunts: No atrial level shunt detected by color flow Doppler.  LEFT VENTRICLE PLAX 2D LVIDd:         4.70 cm  Diastology LVIDs:         2.90 cm  LV e' medial:    5.77 cm/s LV PW:         1.00 cm  LV E/e' medial:  17.5 LV IVS:        1.00 cm  LV e' lateral:   12.90 cm/s LVOT diam:     2.40 cm  LV E/e' lateral: 7.8 LV SV:         95 LV SV Index:   50 LVOT Area:     4.52 cm  RIGHT VENTRICLE RV Basal diam:  2.60 cm RV S prime:     8.16 cm/s TAPSE (M-mode): 1.8 cm LEFT ATRIUM             Index       RIGHT ATRIUM           Index LA diam:        5.10 cm 2.67 cm/m  RA Area:     17.20 cm LA Vol (A2C):   89.8 ml 47.01 ml/m RA Volume:   43.10 ml  22.56 ml/m LA Vol (A4C):   85.6 ml 44.81 ml/m LA Biplane Vol: 88.1 ml 46.12 ml/m  AORTIC VALVE LVOT Vmax:   120.00 cm/s LVOT Vmean:  89.900 cm/s LVOT VTI:    0.211 m  AORTA Ao Root diam: 2.90 cm MITRAL VALVE                TRICUSPID VALVE MV Area (PHT): 6.54 cm     TR Peak  grad:   26.8 mmHg MV Decel Time: 116 msec     TR Vmax:        259.00 cm/s MV E velocity: 101.00 cm/s MV A velocity: 63.80 cm/s   SHUNTS MV E/A ratio:  1.58         Systemic VTI:  0.21 m                             Systemic Diam: 2.40 cm Loralie Champagne MD Electronically signed by Loralie Champagne MD Signature Date/Time: 04/24/2020/6:04:39 PM    Final    Telemetry    04/25/20 converted to NSR @ 0800- Personally Reviewed  ECG    No new tracing as of 04/25/20- Personally Reviewed  Cardiac Studies   Echocardiogram 04/24/20:  1. Left ventricular ejection fraction, by estimation, is 50 to 55%. The  left ventricle has low normal function. The left ventricle demonstrates  regional wall motion abnormalities, possible lateral hypokinesis. Left  ventricular diastolic parameters are  indeterminate.  2. Right ventricular systolic function is normal. The right ventricular  size is normal. There is normal pulmonary artery systolic pressure. The  estimated right ventricular systolic pressure is 24.0 mmHg.  3. Left atrial size was moderately dilated.  4. The mitral valve is normal in structure. No  evidence of mitral valve  regurgitation. No evidence of mitral stenosis.  5. The aortic valve is tricuspid. Aortic valve regurgitation is not  visualized. No aortic stenosis is present.  6. The inferior vena cava is dilated in size with >50% respiratory  variability, suggesting right atrial pressure of 8 mmHg.  7. There is a substantial left pleural effusion.  8. The patient was in atrial fibrillation.    ECHO IMPRESSIONS 10/2018  1. Left ventricular ejection fraction, by visual estimation, is 60 to 65%. The left ventricle has normal function. Mildly increased left ventricular size. There is mildly increased left ventricular hypertrophy. The left ventricular hypertrophy involves  basal-septum walls. 2. Global right ventricle has normal systolic function.The right ventricular size is normal. No  increase in right ventricular wall thickness. 3. Left atrial size was severely dilated. 4. Right atrial size was severely dilated. 5. The mitral valve is normal in structure. Moderate mitral valve regurgitation. No evidence of mitral stenosis. 6. The tricuspid valve is normal in structure. Tricuspid valve regurgitation is mild. 7. The aortic valve is tricuspid Aortic valve regurgitation was not visualized by color flow Doppler. Mild aortic valve sclerosis without stenosis. 8. The pulmonic valve was normal in structure. Pulmonic valve regurgitation is trivial by color flow Doppler.   LEFT HEART CATH AND CORONARY ANGIOGRAPHY 05/2017  Conclusion    Dist RCA lesion is 60% stenosed.  Prox RCA to Mid RCA lesion is 30% stenosed.  Prox Cx lesion is 25% stenosed.  Prox LAD to Mid LAD lesion is 25% stenosed.  Mid LAD lesion is 40% stenosed.  LV end diastolic pressure is normal.  There is no aortic valve stenosis.  Diffuse, calcific CAD particularly in the distal RCA and proximal to mid LAD. LAD disease is nonobstructive. RCA disease is more severe but does not appear significant. Given her lack of symptoms, would pursue medical therapy.   Right radial spasm noted. If 6 Fr catheter was needed in the future, would recommend groin access.    Patient Profile     65 y.o. female with a hx of liver cirrhosis who is being seen today for the evaluation of atrial fibrillation at the request of Dr. Grandville Silos.  Assessment & Plan    1. Atrial fibrillation with RVR: -Converted to NSR early this AM>>rates controlled  -No anticoagulation with liver disease and chronic thrombocytopenia  -Continue IV amiodarone for now however will need to discuss long term therapy as amio is not ideal with advanced liver disease -BPs are more stable today>>can likely tolerate adding beta blocker back to regimen   2. Mildly elevated HsT: -Flat trend>>not consistent with ACS -Likely in the setting of  demand ischemia with RVR  3. Alcoholic liver cirrhosis: -Hepatic encephalopathy with chronic thromobocytopenia -Advanced disease>>followed with [facility]>>was told yesterday that she has been taken off the transplant list  4. Chronic systolic CHF: -Echocardiogram with LVEF at 40-45% -Volume overload felt to largely be in the setting of ascites due to liver disease.  -Continue spironolactone 179m QD  5. Diffuse non-obstructive CAD: -Per last cath 05/12/2017 with plans to continue with medical management  -Denies chest pain   Signed, JKathyrn DrownNP-C HMacksburgPager: [phone]/15/2022, 10:47 AM     For questions or updates, please contact   Please consult [email or web address] for contact info under Cardiology/STEMI.  Agree with plan. Laughing after coming back from CT. Jaundiced, heart regular rate and rhythm Paroxysmal atrial fibrillation- Converted to normal sinus rhythm. Lets change her to amiodarone 200 mg  p.o. twice daily hopefully for short-term especially with her cirrhosis.    Candee Furbish, MD

## 2020-04-25 NOTE — Progress Notes (Signed)
Daily Progress Note   Patient Name: Dana Watkins       Date: 04/25/2020 DOB: 1955/10/25  Age: 65 y.o. MRN#: [id number] Attending Physician: Eugenie Filler, MD Primary Care Physician: Lawerance Cruel, MD Admit Date: 04/20/2020  Reason for Consultation/Follow-up: Establishing goals of care  Subjective: Patient resting in bed, not as awake alert today, did not participate much in goals of care discussions.  Discussed with son and daughter present at the bedside, see below.  Length of Stay: 4  Current Medications: Scheduled Meds:  . Chlorhexidine Gluconate Cloth  6 each Topical Daily  . lactulose  30 g Oral BID  . mouth rinse  15 mL Mouth Rinse BID  . mupirocin ointment  1 application Nasal BID  . [START ON 04/26/2020] spironolactone  100 mg Oral Daily  . zinc sulfate  220 mg Oral Daily    Continuous Infusions: . amiodarone 30 mg/hr (04/25/20 0904)  . cefTRIAXone (ROCEPHIN)  IV Stopped (04/24/20 1222)    PRN Meds: alum & mag hydroxide-simeth, hydrOXYzine, LORazepam, morphine injection, oxyCODONE, polyvinyl alcohol  Physical Exam         Arouses some but does not respond and is able to awaken and attempts to verbalize Appears more icteric today Appears with generalized weakness  Vital Signs: BP (!) 120/55   Pulse 76   Temp 98.5 F (36.9 C) (Oral)   Resp (!) 28   Ht 5' 8"  (1.727 m)   Wt 76.2 kg   SpO2 100%   BMI 25.54 kg/m  SpO2: SpO2: 100 % O2 Device: O2 Device: Room Air O2 Flow Rate:    Intake/output summary:   Intake/Output Summary (Last 24 hours) at 04/25/2020 1119 Last data filed at 04/25/2020 0900 Gross per 24 hour  Intake 1093.66 ml  Output 501 ml  Net 592.66 ml   LBM: Last BM Date: 04/24/20 Baseline Weight: Weight: 80.1 kg Most recent weight:  Weight: 76.2 kg      Palliative performance scale 30%. Palliative Assessment/Data:    Palliative performance scale 30%.  Patient Active Problem List   Diagnosis Date Noted  . Acute blood loss anemia 04/21/2020  . Rectus sheath hematoma 04/21/2020  . Alcoholic cirrhosis of liver with ascites ([place]) 04/21/2020  . Chronic kidney disease, stage 3a ([place]) 04/21/2020  . History of alcohol abuse  04/21/2020  . Coagulopathy ([place]) 04/21/2020  . Hypomagnesemia 04/21/2020  . Chronic combined systolic (congestive) and diastolic (congestive) heart failure ([place]) 04/21/2020  . AF (paroxysmal atrial fibrillation) ([place]) 04/21/2020  . Prolonged QT interval 04/21/2020  . Palliative care by specialist   . Goals of care, counseling/discussion   . Advance care planning   . Abnormal liver function tests   . Liver failure without hepatic coma ([place]) 02/20/2020  . Hepatic encephalopathy ([place]) 02/20/2020  . Fall   . Hemothorax on left 08/24/2019  . Acute on chronic respiratory failure ([place]) 08/07/2019  . Acute coronary syndrome ([place]) 08/06/2019  . Anemia due to stage 3 chronic kidney disease ([place]) 04/16/2019  . Fever 02/21/2019  . Hypophosphatemia   . Recurrent Clostridioides difficile diarrhea 12/11/2018  . Weakness 12/09/2018  . Hyperbilirubinemia 10/31/2018  . C. difficile diarrhea 10/31/2018  . Hepatic cirrhosis ([place]) 10/31/2018  . Systolic murmur at cardiac apex 10/31/2018  . Sepsis ([place]) 10/30/2018  . Normocytic anemia 10/23/2018  . SIRS (systemic inflammatory response syndrome) ([place]) 10/16/2018  . Acute renal failure superimposed on stage 3 chronic kidney disease ([place]) 10/16/2018  . Acute lower UTI 10/16/2018  . Elevated troponin 10/16/2018  . Elevated d-dimer 10/16/2018  . UTI (urinary tract infection) 06/25/2018  . Community acquired pneumonia of right middle lobe of lung 06/09/2018  . AKI (acute kidney injury) ([place]) 06/09/2018  . GAD (generalized anxiety disorder) 11/06/2017  .  Abnormal findings on diagnostic imaging of heart and coronary circulation   . Family history of coronary artery disease 05/09/2017  . DOE (dyspnea on exertion) 05/09/2017  . Coronary artery disease involving native coronary artery of native heart without angina pectoris 03/08/2017  . Acute bronchitis 02/06/2017  . Dehydration   . Diarrhea 02/03/2017  . Nausea & vomiting 02/02/2017  . Arthritis 05/15/2015  . Benign essential hypertension 05/15/2015  . Migraine headache 05/15/2015  . Hypokalemia 11/26/2014  . Alkaline phosphatase elevation 11/26/2014  . Abnormal weight gain 11/26/2014  . Chronic leukopenia 05/28/2014  . Symptomatic anemia 05/28/2014  . Thrombocytopenia ([place]) 04/30/2013  . Elevated liver function tests 04/30/2013    Palliative Care Assessment & Plan   Patient Profile: 65 year old lady with history of alcoholic cirrhosis complicated by hepatic encephalopathy chronic thrombocytopenia stage III chronic kidney disease history of alcohol use, history of coronary artery disease systolic diastolic congestive heart failure ejection fraction 40-45% with recent complication of rectus sheath hematoma for which she underwent embolization procedure at [facility], presented to [facility] long emergency department and was admitted to hospital medicine service for abdominal pain and vomiting.  Patient was previously enrolled in hospice/palliative services.  Palliative care consultation has been requested for ongoing goals of care discussions.  Assessment: Acute blood loss anemia on admission from rectus sheath hematoma in the setting of coagulopathy and thrombocytopenia.  Status post embolization procedure at [facility] prior to this hospitalization Decompensated alcoholic liver disease with ascites History of alcohol use Chronic kidney disease stage III Atrial fibrillation with rapid ventricular response. Generalized deconditioning  Recommendations/Plan: Family meeting with patient's son and  daughter who are both present at bedside.  Patient did not participate much.  We reviewed about patient's current condition.  In "care everywhere" section of her chart, reviewed pertinent information from [place] [facility], at the present time, patient is not deemed an appropriate candidate for transplant. Patient son and daughter discussed about the patient's extensive symptom burden from an anxiety depression standpoint which also includes panic attacks.  This is having a severe  stress on the patient's overall condition.  Goals wishes and values important to the patient and family attempted to be explored.  We discussed about patient's current condition.  We talked about optimizing symptom management.  Patient has been started on low-dose Ativan to be used at night, will liberalize this to every 4-6 hours on an as-needed basis and monitor.  Additionally, will start low-dose duloxetine for neuropathic pain and monitor.  Palliative medicine team to follow.  Monitor hospital course and overall disease trajectory of illness.  Will likely benefit from addition of hospice services either at home or even transfer to residential hospice towards the end of this hospitalization, discussed with son and daughter at bedside, palliative to follow.   Code Status:    Code Status Orders  (From admission, onward)         Start     Ordered   04/21/20 0742  Do not attempt resuscitation (DNR)  Continuous       Question Answer Comment  In the event of cardiac or respiratory ARREST Do not call a "code blue"   In the event of cardiac or respiratory ARREST Do not perform Intubation, CPR, defibrillation or ACLS   In the event of cardiac or respiratory ARREST Use medication by any route, position, wound care, and other measures to relive pain and suffering. May use oxygen, suction and manual treatment of airway obstruction as needed for comfort.      04/21/20 0741        Code Status History    Date  Active Date Inactive Code Status Order ID Comments User Context   02/28/2020 1026 02/28/2020 1701 DNR [id number]  Rosezella Rumpf, NP Inpatient   02/20/2020 1413 02/28/2020 1025 Full Code [id number]  Lequita Halt, MD ED   08/06/2019 2334 08/14/2019 1919 Full Code [id number]  Dixie Dials, MD ED   02/20/2019 1213 02/23/2019 0015 Full Code [id number]  Reubin Milan, MD ED   12/09/2018 0607 12/11/2018 2112 Full Code [id number]  Jani Gravel, MD Inpatient   10/31/2018 0759 11/02/2018 1601 Full Code [id number]  Vianne Bulls, MD ED   10/16/2018 1953 10/24/2018 1942 Full Code [id number]  Samuella Cota, MD Inpatient   06/25/2018 1950 06/28/2018 1651 Full Code [id number]  Elwyn Reach, MD Inpatient   06/09/2018 1825 06/10/2018 1454 Full Code [id number]  Mariel Aloe, MD Inpatient   05/12/2017 1219 05/12/2017 1927 Full Code [id number]  [name] Booze, MD Inpatient   02/02/2017 2320 02/06/2017 2010 Full Code [id number]  Etta Quill, DO ED   Advance Care Planning Activity      Prognosis:  guarded.   Discharge Planning: To Be Determined  Care plan was discussed with Dr. Grandville Silos, patient, son and daughter present at the bedside.  Thank you for allowing the Palliative Medicine Team to assist in the care of this patient.   Time In: 9 Time Out: 9.35 Total Time  35 Prolonged Time Billed  no       Greater than 50%  of this time was spent counseling and coordinating care related to the above assessment and plan.  Loistine Chance, MD  Please contact Palliative Medicine Team phone at [phone] for questions and concerns.

## 2020-04-25 NOTE — Progress Notes (Signed)
PT Cancellation Note  Patient Details Name: Dana Watkins MRN: [id number] DOB: 06-Dec-1955   Cancelled Treatment:    Reason Eval/Treat Not Completed: Patient busy with nursing in AM, In PM asleep/resting. Claretha Cooper 04/25/2020, 4:17 PM  [place] Pager [phone] Office [phone]

## 2020-04-25 NOTE — Progress Notes (Signed)
I received a call from [name]'s clergy today. He relayed how long their anticipatory grieving process has been going on.  When  I walked in, Dana Watkins was tearful and very distressed that "Jesus has not taken me yet."  She oscillated between grieving that she would not likely be here for her daughter's wedding in May and see her children continue to live their lives, and feeling desperate for the discomfort, grief and pain to ease.  I provided prayer for Dana Watkins and her 2 children who were present at [name]'s request.  I reflected back to them that they were showing each other much love and that it seemed they had had the important conversations and said the things they needed to say to each other. I encouraged them to enjoy the simplicity of the present moment and not feel that they had to continue the heavy, end-of-life conversations, because they were so tired.  Sustaining the emotional intensity of those conversations has been exhausting them.  Dana Watkins is hoping to be able to see other family members and seemed more at peace after our prayer and conversation.  I asked if either of her adult children wanted to talk, but they reported that they are doing okay for the time being.  They would like to continue to receive chaplain support.  I will pass this on to the chaplain on-call for the weekend, but please also page as needs arise.  [place], [place] Pager, [phone] 4:23 PM

## 2020-04-25 NOTE — Progress Notes (Signed)
Was called by RN that patient with complaints of right upper extremity weakness and concern for acute CVA. Code stroke initiated by RN. Came and assessed patient and patient alert, following commands. Patient stating right upper extremity felt weak like a ton of bricks which she feels is attributed to the IV.  Patient states right upper extremity weakness has improved and arm does not feel as weak as he did. Physical exam General: NAD.  Jaundiced Respirations: CTA B anterior lung fields Cardiovascular: RRR no murmurs rubs or gallops GI: Abdomen is soft, nontender, mildly distended, positive bowel sounds. Neuro: Alert and oriented x3.  Cranial nerves II through XII grossly intact.  Sensation intact.  5/5 bilateral lower extremity strength.  5/5 left upper extremity strength.  4/5 right upper extremity strength.3-4/5 grip strength.  Reflexes not tested as patient unwilling to head CT.  Gait not tested secondary to safety.  Assessment/plan #1 right upper extremity weakness Patient noted per RN to have right upper extremity weakness concern for acute CVA inpatient with recent A. fib with RVR on amiodarone drip currently in normal sinus rhythm.  Patient with a coagulopathy last INR of 2.5 (04/24/2020).  Acute code stroke activated per RN.  Patient on the way to head CT. -Patient in a difficult situation.  Patient with decompensated cirrhosis, coagulopathy, thrombocytopenia.  Patient not a TPA candidate due to coagulopathy and thrombocytopenia.  Head CT pending.  Unable to place on anticoagulation due to coagulopathy.  Spoke with tele neurologist who will assist head CT and make further recommendations.  Patient likely not a candidate for any interventions. Follow.   Time spent 30 minutes.

## 2020-04-25 NOTE — Progress Notes (Signed)
PROGRESS NOTE    Dana Watkins  MWN:[id number] DOB: 10-07-1955 DOA: 04/20/2020 PCP: Lawerance Cruel, MD    Chief Complaint  Patient presents with  . Vomiting    Brief Narrative:  65 year old female with past medical history of of alcoholic cirrhosis(complicated by hepatic encephalopathyandchronic thrombocytopenia),chronic kidney disease stage IIIa, alcohol abuse, coronary artery disease(nonobstrucive mulivessel disease via cath 04/345),[id number] and diastolic congestive heart failure(EF 40-45% with V5IE)PPI recent complication of rectus sheath hematoma who presents to [facility] emergency department with abdominal pain and vomiting. Patietn was found to be thrombocytopenic with elevated INR in the setting of rectus sheath hematoma. GI was consulted   Assessment & Plan:   Principal Problem:   Acute blood loss anemia Active Problems:   Thrombocytopenia ([place])   Hypokalemia   Coronary artery disease involving native coronary artery of native heart without angina pectoris   Hepatic encephalopathy (HCC)   Rectus sheath hematoma   Alcoholic cirrhosis of liver with ascites (HCC)   Chronic kidney disease, stage 3a (HCC)   History of alcohol abuse   Coagulopathy (HCC)   Hypomagnesemia   Chronic combined systolic (congestive) and diastolic (congestive) heart failure (HCC)   AF (paroxysmal atrial fibrillation) (HCC)   Prolonged QT interval  1 acute blood loss anemia secondary to rectus sheath hematoma in the setting of coagulopathy and thrombocytopenia -CT abdomen and pelvis revealed enlarging rectus sheath hematoma despite embolization at [facility] last week.- -Patient noted to have a significant drop in hemoglobin on presentation at 6.6 from 7.8 on discharge from [facility]. -Patient placed on vitamin K with INR trending down currently at 2.5. -Repeat INR tomorrow. -Status post transfusion 1 unit packed red blood cells, 1 unit of platelets, 2 units of cryoprecipitate at  time of presentation. -Hemoglobin currently stable at 9.0 from 8.2 with a platelet count of 74. -Dr. Cyd Silence discussed CT findings with Dr. Anselm Pancoast with IR who did not feel that current hematoma is expansive enough to warrant intervention especially with recent intervention that was done. -GI following and recommended continued supportive care with outpatient follow-up with [facility] hepatology.  2.  Hypokalemia -Patient noted to have recurrent severe hypokalemia. -Potassium currently at 3.6. -Spironolactone on hold.   -K. Dur 40 mEq p.o. x1 to keep potassium approximately at 4. -Repeat labs in the morning.  3.  Coronary artery disease -Currently chest pain-free. -Not on antiplatelet therapy due to coagulopathy. -Spironolactone on hold due to A. fib with RVR and borderline to low blood pressure yesterday.  Will defer resumption of spironolactone to cardiology recommendations.   4.  Hepatic encephalopathy -Improved clinically on lactulose.   -Lactulose decreased to twice daily per GI with goal of 2-3 bowel movements per day.  5.  Decompensated alcoholic cirrhosis of the liver with ascites -Patient with advanced disease with numerous complications including hepatic encephalopathy, chronic thrombocytopenia, coagulopathy -MELD score of 33, suggestive of 52.6% 52-monthmortality. -[facility] -PT consulted. -GI not recommending paracentesis at this time. -Continue SBP prophylaxis with IV Rocephin 1 g daily. -Continue 2 g sodium restricted diet. -Spironolactone held secondary to A. fib with RVR and borderline to low blood pressure. -We will defer timing of resumption of spironolactone to cardiology. -Patient received a dose of Lasix 40 mg IV x1 after being given IV albumin (04/23/2020) evening with good urine output and improvement with abdominal distention and lower extremity edema.   -Patient stated had a urine output of close to 2 L however 800 cc recorded.   -  Patient seen by palliative care. -Patient stating yesterday evening that she was told she was taken off the transplant list and was very tearful and felt very hopeless. -Outpatient follow-up at [facility] transplant center/hepatology. -GI was following but have signed off.  6.  Chronic kidney disease stage IIIa -Renal function close to baseline. -Minimize nephrotoxic agents.   -Strict I's and O's.   -Spironolactone on hold due to soft/borderline blood pressure and A. fib.   7.  History of alcohol abuse -Patient noted to have been abstinent from drinking since the holidays. -Continue to recommend complete abstinence.  8.  Chronic combined systolic and diastolic heart failure -Noted most of patient's anasarca/volume overload secondary to low oncotic pressure from liver disease and not due to heart failure -Patient's abdominal distention and ascites improved after dose of IV albumin followed by 40 of Lasix IV x1 (04/23/2020).  Spironolactone on hold and could likely resume tomorrow if okay with cardiology. -Urine output recorded of 500 cc over the past 24 hours. -Follow.   9.  Atrial fibrillation with RVR -Patient noted to go in A. fib with RVR the evening of 04/23/2020 with heart rates in the 160s to 170s received a total of 7.5 mg IV Lopressor.  Patient's home regimen Coreg resumed.  Patient noted to be in A. fib with RVR with heart rates in the 130s to 160s during the day (04/24/2020) however remained asymptomatic.  Patient received a dose of IV Lopressor and subsequently placed on 2 IV amiodarone drip and transferred to the stepdown unit.   -Cardiac enzymes obtain elevated but somewhat flattened likely demand ischemia in the setting of A. fib with RVR.   -2D echo obtained with EF of 50 to 55%, regional wall motion abnormalities of the left ventricle possible lateral hypokinesis, moderately dilated left atrial size.  -Patient received a 250 cc bolus of normal saline as patient noted to be  hypotensive with improvement with blood pressure. -Patient placed on amiodarone drip and seems to have converted to normal sinus rhythm this morning at approximately 8 AM. -Coreg discontinued per cardiology. -Patient not on anticoagulation due to coagulopathy. -Cardiology following and appreciate input and recommendations.  10.  Prolonged QT interval -Patient noted to have a markedly prolonged QT interval on admission likely felt exacerbated by electrolyte abnormalities. -Repeat EKG with resolution of QT prolongation. -Currently on IV Rocephin for SBP prophylaxis and ciprofloxacin on hold. -Likely resume psychotropic medications on discharge.  11.  Hypokalemia -Likely secondary to diuretics. -Potassium currently at 3.6.  We will give a dose of K. Dur 40 mEq p.o. x1.  Magnesium at 1.9.  We will give magnesium sulfate 2 g IV x1 as patient was in A. fib.       DVT prophylaxis: INR supratherapeutic at 2.5 Code Status: DNR Family Communication: Updated patient and daughter at bedside. Disposition:   Status is: Inpatient    Dispo: The patient is from: Home              Anticipated d/c is to: Home with hospice following.              Patient currently with rectus sheath hematoma, H&H being monitored, decompensated cirrhosis, on amiodarone drip for A. fib with RVR.  Not stable for discharge.    Difficult to place patient no       Consultants:   Palliative care: Dr. Rowe Pavy 04/22/2020  Gastroenterology: Dr. Michail Sermon 04/21/2020  Cardiology: Dr. Marlou Porch 04/24/2020  Procedures:   CT abdomen and pelvis 04/21/2020  Transfusion of 2 units cryoprecipitate 04/21/2020  Transfusion 1 unit packed red blood cells 04/22/2018  Transfusion 1 unit platelets 04/21/2020  2D echo 04/24/2020  Antimicrobials:   IV Rocephin 04/21/2020 >>>>     Subjective: Laying in bed.  Denies any chest pain.  No significant shortness of breath.  Denies any abdominal pain.  On amiodarone drip noted to have  converted into sinus rhythm around 8 AM.  Daughter at bedside. Patient stated last night that she was taken off the transplant list.  Objective: Vitals:   04/25/20 0700 04/25/20 0730 04/25/20 0800 04/25/20 0900  BP:   127/60   Pulse: 76 77 75 76  Resp: (!) 24 (!) 24 (!) 24 (!) 28  Temp:   98.5 F (36.9 C)   TempSrc:   Oral   SpO2: 97% 98% 98% 99%  Weight:      Height:        Intake/Output Summary (Last 24 hours) at 04/25/2020 0922 Last data filed at 04/25/2020 0852 Gross per 24 hour  Intake 1104.29 ml  Output 501 ml  Net 603.29 ml   Filed Weights   04/24/20 0500 04/24/20 0900 04/25/20 0500  Weight: 81.3 kg 77.4 kg 76.2 kg    Examination:  General exam: : NAD.  Jaundiced Respiratory system: Decreased breath sounds in the left base.  No wheezing.  No crackles.  No rhonchi.  Speaking in full sentences.  Normal respiratory effort.  Cardiovascular system: Regular rate rhythm no murmurs rubs or gallops.  No JVD.  No lower extremity edema.  Gastrointestinal system: Abdomen soft, nontender, mildly distended, positive bowel sounds.  Ecchymosis noted over the mid to left abdomen.  No rebound.  No guarding.  Central nervous system: Alert and oriented. No focal neurological deficits. Extremities: Symmetric 5 x 5 power. Skin: Jaundiced Psychiatry: Judgement and insight appear normal. Mood & affect appropriate.   Data Reviewed: I have personally reviewed following labs and imaging studies  CBC: Recent Labs  Lab 04/20/20 2351 04/21/20 0948 04/21/20 1619 04/21/20 2157 04/22/20 0217 04/23/20 0535 04/24/20 0507 04/25/20 0234  WBC 13.0*   < > 9.3 9.3 9.3 9.0 8.5 9.5  NEUTROABS 10.3*  --  6.6  --   --   --  5.4 6.1  HGB 6.6*   < > 7.9* 8.1* 7.8* 8.0* 8.2* 9.0*  HCT 20.0*   < > 24.3* 25.3* 24.4* 25.1* 25.1* 28.4*  MCV 108.1*   < > 105.7* 108.1* 107.0* 110.6* 108.7* 111.8*  PLT 47*   < > 77* 71* 71* 70* 69* 74*   < > = values in this interval not displayed.    Basic Metabolic  Panel: Recent Labs  Lab 04/20/20 2351 04/21/20 0948 04/22/20 0217 04/23/20 0535 04/24/20 0507 04/25/20 0234  NA 133* 133* 138 134* 135 135  K 2.8* 3.9 3.7 3.9 3.4* 3.6  CL 111 110 113* 112* 111 112*  CO2 17* 17* 16* 18* 19* 18*  GLUCOSE 87 89 105* 106* 106* 113*  BUN 14 16 12 12 12 13   CREATININE 1.18* 0.81 1.11* 1.04* 1.17* 1.10*  CALCIUM 8.5* 9.3 9.6 9.3 9.2 9.1  MG 1.6* 2.2  --   --  2.0 1.9    GFR: Estimated Creatinine Clearance: 52.1 mL/min (A) (by C-G formula based on SCr of 1.1 mg/dL (H)).  Liver Function Tests: Recent Labs  Lab 04/21/20 0948 04/22/20 0217 04/23/20 0535 04/24/20 0507 04/25/20 0234  AST 83* 73* 73* 65* 60*  ALT 39 40 42 35  34  ALKPHOS 194* 197* 213* 186* 195*  BILITOT 14.4* 13.6* 12.1* 12.7* 10.5*  PROT 4.8* 5.0* 4.9* 4.9* 4.8*  ALBUMIN 2.1* 2.2* 2.0* 2.3* 2.0*    CBG: No results for input(s): GLUCAP in the last 168 hours.   Recent Results (from the past 240 hour(s))  SARS CORONAVIRUS 2 (TAT 6-24 HRS) Nasopharyngeal Nasopharyngeal Swab     Status: None   Collection Time: 04/21/20  5:28 AM   Specimen: Nasopharyngeal Swab  Result Value Ref Range Status   SARS Coronavirus 2 NEGATIVE NEGATIVE Final    Comment: (NOTE) SARS-CoV-2 target nucleic acids are NOT DETECTED.  The SARS-CoV-2 RNA is generally detectable in upper and lower respiratory specimens during the acute phase of infection. Negative results do not preclude SARS-CoV-2 infection, do not rule out co-infections with other pathogens, and should not be used as the sole basis for treatment or other patient management decisions. Negative results must be combined with clinical observations, patient history, and epidemiological information. The expected result is Negative.  Fact Sheet for Patients: SugarRoll.be  Fact Sheet for Healthcare Providers: https://www.woods-mathews.com/  This test is not yet approved or cleared by the [place]  FDA and  has been authorized for detection and/or diagnosis of SARS-CoV-2 by FDA under an Emergency Use Authorization (EUA). This EUA will remain  in effect (meaning this test can be used) for the duration of the COVID-19 declaration under Se ction 564(b)(1) of the Act, 21 U.S.C. section 360bbb-3(b)(1), unless the authorization is terminated or revoked sooner.  Performed at [facility], [place] [address], [place], [place] [address]   MRSA PCR Screening     Status: Abnormal   Collection Time: 04/21/20  6:25 AM   Specimen: Nasal Mucosa; Nasopharyngeal  Result Value Ref Range Status   MRSA by PCR POSITIVE (A) NEGATIVE Final    Comment:        The GeneXpert MRSA Assay (FDA approved for NASAL specimens only), is one component of a comprehensive MRSA colonization surveillance program. It is not intended to diagnose MRSA infection nor to guide or monitor treatment for MRSA infections. RESULT CALLED TO, READ BACK BY AND VERIFIED WITH: CAUDLE,L. RN AT 1127 04/21/20 MULLINS,T Performed at [facility], [place] [address] [place], [place] [address]          Radiology Studies: DG CHEST PORT 1 VIEW  Result Date: 04/24/2020 CLINICAL DATA:  Pleural effusion. EXAM: PORTABLE CHEST 1 VIEW COMPARISON:  February 20, 2020. FINDINGS: Stable cardiomediastinal silhouette. No pneumothorax is noted. Right lung is clear. Diffuse opacity of left hemithorax is noted consistent with layering effusion. Left basilar atelectasis is present as well. Bony thorax is unremarkable. IMPRESSION: Large layering left pleural effusion is noted with associated left basilar atelectasis. Electronically Signed   By: Marijo Conception M.D.   On: 04/24/2020 16:22   ECHOCARDIOGRAM COMPLETE  Result Date: 04/24/2020    ECHOCARDIOGRAM REPORT   Patient Name:   SHAKYIA BOSSO Helvey Date of Exam: 04/24/2020 Medical Rec #:  [id number]        Height:       68.0 in Accession #:    [id number]       Weight:       170.6 lb  Date of Birth:  [birth year]       BSA:          1.910 m Patient Age:    77 years         BP:  97/66 mmHg Patient Gender: F                HR:           111 bpm. Exam Location:  Inpatient Procedure: 2D Echo, Cardiac Doppler and Color Doppler Indications:    Atrial fibrillation  History:        Patient has prior history of Echocardiogram examinations, most                 recent 08/07/2019. CHF, CAD; Arrythmias:Atrial Fibrillation.                 Alcoholic cirrhosis, anemia.  Sonographer:    Dustin Flock Referring Phys: [phone] Phillis Thackeray V Adam Demary  Sonographer Comments: Image acquisition challenging due to respiratory motion. IMPRESSIONS  1. Left ventricular ejection fraction, by estimation, is 50 to 55%. The left ventricle has low normal function. The left ventricle demonstrates regional wall motion abnormalities, possible lateral hypokinesis. Left ventricular diastolic parameters are indeterminate.  2. Right ventricular systolic function is normal. The right ventricular size is normal. There is normal pulmonary artery systolic pressure. The estimated right ventricular systolic pressure is 03.4 mmHg.  3. Left atrial size was moderately dilated.  4. The mitral valve is normal in structure. No evidence of mitral valve regurgitation. No evidence of mitral stenosis.  5. The aortic valve is tricuspid. Aortic valve regurgitation is not visualized. No aortic stenosis is present.  6. The inferior vena cava is dilated in size with >50% respiratory variability, suggesting right atrial pressure of 8 mmHg.  7. There is a substantial left pleural effusion.  8. The patient was in atrial fibrillation. FINDINGS  Left Ventricle: Left ventricular ejection fraction, by estimation, is 50 to 55%. The left ventricle has low normal function. The left ventricle demonstrates regional wall motion abnormalities. The left ventricular internal cavity size was normal in size. There is no left ventricular hypertrophy. Left ventricular  diastolic parameters are indeterminate. Right Ventricle: The right ventricular size is normal. No increase in right ventricular wall thickness. Right ventricular systolic function is normal. There is normal pulmonary artery systolic pressure. The tricuspid regurgitant velocity is 2.59 m/s, and  with an assumed right atrial pressure of 8 mmHg, the estimated right ventricular systolic pressure is 74.2 mmHg. Left Atrium: Left atrial size was moderately dilated. Right Atrium: Right atrial size was normal in size. Pericardium: There is a substantial left pleural effusion. There is no evidence of pericardial effusion. Mitral Valve: The mitral valve is normal in structure. No evidence of mitral valve regurgitation. No evidence of mitral valve stenosis. Tricuspid Valve: The tricuspid valve is normal in structure. Tricuspid valve regurgitation is trivial. Aortic Valve: The aortic valve is tricuspid. Aortic valve regurgitation is not visualized. No aortic stenosis is present. Pulmonic Valve: The pulmonic valve was not well visualized. Pulmonic valve regurgitation is not visualized. Aorta: The aortic root is normal in size and structure. Venous: The inferior vena cava is dilated in size with greater than 50% respiratory variability, suggesting right atrial pressure of 8 mmHg. IAS/Shunts: No atrial level shunt detected by color flow Doppler.  LEFT VENTRICLE PLAX 2D LVIDd:         4.70 cm  Diastology LVIDs:         2.90 cm  LV e' medial:    5.77 cm/s LV PW:         1.00 cm  LV E/e' medial:  17.5 LV IVS:        1.00 cm  LV e' lateral:   12.90 cm/s LVOT diam:     2.40 cm  LV E/e' lateral: 7.8 LV SV:         95 LV SV Index:   50 LVOT Area:     4.52 cm  RIGHT VENTRICLE RV Basal diam:  2.60 cm RV S prime:     8.16 cm/s TAPSE (M-mode): 1.8 cm LEFT ATRIUM             Index       RIGHT ATRIUM           Index LA diam:        5.10 cm 2.67 cm/m  RA Area:     17.20 cm LA Vol (A2C):   89.8 ml 47.01 ml/m RA Volume:   43.10 ml  22.56 ml/m  LA Vol (A4C):   85.6 ml 44.81 ml/m LA Biplane Vol: 88.1 ml 46.12 ml/m  AORTIC VALVE LVOT Vmax:   120.00 cm/s LVOT Vmean:  89.900 cm/s LVOT VTI:    0.211 m  AORTA Ao Root diam: 2.90 cm MITRAL VALVE                TRICUSPID VALVE MV Area (PHT): 6.54 cm     TR Peak grad:   26.8 mmHg MV Decel Time: 116 msec     TR Vmax:        259.00 cm/s MV E velocity: 101.00 cm/s MV A velocity: 63.80 cm/s   SHUNTS MV E/A ratio:  1.58         Systemic VTI:  0.21 m                             Systemic Diam: 2.40 cm Loralie Champagne MD Electronically signed by Loralie Champagne MD Signature Date/Time: 04/24/2020/6:04:39 PM    Final         Scheduled Meds: . Chlorhexidine Gluconate Cloth  6 each Topical Daily  . lactulose  30 g Oral BID  . mouth rinse  15 mL Mouth Rinse BID  . mupirocin ointment  1 application Nasal BID  . [START ON 04/26/2020] spironolactone  100 mg Oral Daily  . zinc sulfate  220 mg Oral Daily   Continuous Infusions: . amiodarone 30 mg/hr (04/25/20 0904)  . cefTRIAXone (ROCEPHIN)  IV Stopped (04/24/20 1222)     LOS: 4 days    Time spent: 40 minutes    Irine Seal, MD [facility]   To contact the attending provider between 7A-7P or the covering provider during after hours 7P-7A, please log into the web site [email or web address] and access using universal [place] password for that web site. If you do not have the password, please call the hospital operator.  04/25/2020, 9:22 AM

## 2020-04-25 NOTE — Consult Note (Addendum)
[facility] TELEMEDICINE  CONSULT   Date of service: April 25, 2020 Patient Name: Dana Watkins MRN:  [id number] DOB:  08-07-1955 Requesting Provider: Irine Seal MD Location of the provider: [facility] Office Location of the patient: Dana Watkins. Reason for consult: "Stroke code"   This consult was provided via telemedicine with 2-way video and audio communication. The patient/family was informed that care would be provided in this way and agreed to receive care in this manner.   History of Present Illness  Dana Watkins is a 65 y.o. female with PMH significant for EtOH cirrhosis, chronic thrombocytopenia, CKD 3A, CAD, CHF, recent rectus sheath hematoma at [facility] who is admitted with abdominal pain and vomiting, found to have enlarging rectus sheath hematoma. Also this hospitalization, she was found to have new onset Afibb with RVR. She is on Amiodarone with plan to transition to PO.  On 04/25/20, a little after 1130, patient noted that she was unable to move her R arm. She thinks that this was related to the IV in her arm. She was seen by Hospitalist Dr. Grandville Silos who noted that she had a 3/5 grip strength in RUE, 4/5 otherwise in R arm. Patient reports that this only lasted 5 mins and went away.   RN at bedside helped with assessment of the grip strength reports that her R grip is stronger than her left.  No prior hx of strokes.  Stroke Measures   Last Known Well: 1130 on 04/25/20 TPA Given: No, resolution of symptoms. IR Thrombectomy: No, resolution of symptoms. mRS: Modified Rankin Scale: 0-Completely asymptomatic and back to baseline post- stroke Time of teleneurologist evaluation: 1158   Vitals   Vitals:   04/25/20 0900 04/25/20 0907 04/25/20 1000 04/25/20 1030  BP:  114/72 (!) 120/55   Pulse: 76 79 74 76  Resp: (!) 28 (!) 26 (!) 25 (!) 28  Temp:      TempSrc:      SpO2: 99% 100% 100% 100%  Weight:      Height:         Body mass index is  25.54 kg/m.   Tele-neuro Exam and NIHSS   General: Jaundiced skin.  NIHSS components Score: Comment  1a Level of Conscious 0[x]  1[]  2[]  3[]      1b LOC Questions 0[x]  1[]  2[]       1c LOC Commands 0[x]  1[]  2[]       2 Best Gaze 0[x]  1[]  2[]       3 Visual 0[x]  1[]  2[]  3[]      4 Facial Palsy 0[x]  1[]  2[]  3[]      5a Motor Arm - left 0[x]  1[]  2[]  3[]  4[]  UN[]    5b Motor Arm - Right 0[x]  1[]  2[]  3[]  4[]  UN[]    6a Motor Leg - Left 0[x]  1[]  2[]  3[]  4[]  UN[]    6b Motor Leg - Right 0[x]  1[]  2[]  3[]  4[]  UN[]    7 Limb Ataxia 0[x]  1[]  2[]  3[]  UN[]     8 Sensory 0[x]  1[]  2[]  UN[]      9 Best Language 0[x]  1[]  2[]  3[]      10 Dysarthria 0[x]  1[]  2[]  UN[]      11 Extinct. and Inattention 0[x]  1[]  2[]       TOTAL: 0    Imaging and Labs   CBC:  Recent Labs  Lab 04/24/20 0507 04/25/20 0234  WBC 8.5 9.5  NEUTROABS 5.4 6.1  HGB 8.2* 9.0*  HCT 25.1* 28.4*  MCV 108.7* 111.8*  PLT 69* 74*  Basic Metabolic Panel:  Lab Results  Component Value Date   NA 135 04/25/2020   K 3.6 04/25/2020   CO2 18 (L) 04/25/2020   GLUCOSE 113 (H) 04/25/2020   BUN 13 04/25/2020   CREATININE 1.10 (H) 04/25/2020   CALCIUM 9.1 04/25/2020   GFRNONAA 56 (L) 04/25/2020   GFRAA 43 (L) 09/25/2019   Lipid Panel:  Lab Results  Component Value Date   LDLCALC 111 (H) 08/07/2019   HgbA1c: No results found for: HGBA1C Urine Drug Screen:     Component Value Date/Time   LABOPIA NONE DETECTED 12/09/2018 0054   COCAINSCRNUR NONE DETECTED 12/09/2018 0054   LABBENZ POSITIVE (A) 12/09/2018 0054   AMPHETMU NONE DETECTED 12/09/2018 0054   THCU NONE DETECTED 12/09/2018 0054   LABBARB NONE DETECTED 12/09/2018 0054    Alcohol Level     Component Value Date/Time   ETH <10 02/21/2020 0608   CTH w/o contrast: CTH was negative for a large hypodensity concerning for a large territory infarct or hyperdensity concerning for an ICH   Impression   Dana Watkins is a 65 y.o. female with PMH significant for EtOh use and  decompensated cirrhosis admitted with rebleeding of rectus sheath hematoma and hospitalization complicated by new onset Afibb with RVR who I saw as a stroke code for 5 mins episode of RUE weakness that patient attributes to the IV in her R arm. Her limited tele-neurologic examination is notable for no focal deficit.  She does have stroke risk factors including Afibb with RVR but unfortunately is not a candidate for any anticoagulation given low platelet and coagulopathy with most recent INR of 2.5, just one day ago. Even if this was a stroke, I do not think there is much we can offer, she is a little high risk even for just aspirin 64m, specially given her spontaneous rectus sheath hematoma.  Recommendations  - Can consider MRI Brain without contrast to evaluate for potential strokes but this is unlikely to change management. I don't think she is a candidate for antiplatelet agent or Anticoagulation due to thrombocytopenia and coagulopathy specially with spontaneous rebleeding into her rectus sheath hematoma. Can follow up with neurology outpatient. - Recommend Neuro checks with modified NIHSS while admitted inpatient to monitor for any new or worsening symptoms. ______________________________________________________________________    This patient is receiving care for possible acute neurological changes. There was 35 minutes of care by this provider at the time of service, including time for direct evaluation via telemedicine, review of medical records, imaging studies and discussion of findings with providers, the patient and/or family.  SDonnetta Simpers[facility] Pager Number [phone] If 7pm- 7am, please page neurology on call as listed in [facility]

## 2020-04-26 DIAGNOSIS — D689 Coagulation defect, unspecified: Secondary | ICD-10-CM | POA: Diagnosis not present

## 2020-04-26 DIAGNOSIS — D62 Acute posthemorrhagic anemia: Secondary | ICD-10-CM | POA: Diagnosis not present

## 2020-04-26 DIAGNOSIS — E876 Hypokalemia: Secondary | ICD-10-CM | POA: Diagnosis not present

## 2020-04-26 DIAGNOSIS — Z7189 Other specified counseling: Secondary | ICD-10-CM | POA: Diagnosis not present

## 2020-04-26 DIAGNOSIS — I2510 Atherosclerotic heart disease of native coronary artery without angina pectoris: Secondary | ICD-10-CM | POA: Diagnosis not present

## 2020-04-26 DIAGNOSIS — R69 Illness, unspecified: Secondary | ICD-10-CM | POA: Diagnosis not present

## 2020-04-26 DIAGNOSIS — K7290 Hepatic failure, unspecified without coma: Secondary | ICD-10-CM | POA: Diagnosis not present

## 2020-04-26 DIAGNOSIS — I480 Paroxysmal atrial fibrillation: Secondary | ICD-10-CM | POA: Diagnosis not present

## 2020-04-26 DIAGNOSIS — R531 Weakness: Secondary | ICD-10-CM

## 2020-04-26 DIAGNOSIS — N1831 Chronic kidney disease, stage 3a: Secondary | ICD-10-CM | POA: Diagnosis not present

## 2020-04-26 DIAGNOSIS — I5042 Chronic combined systolic (congestive) and diastolic (congestive) heart failure: Secondary | ICD-10-CM | POA: Diagnosis not present

## 2020-04-26 DIAGNOSIS — Z515 Encounter for palliative care: Secondary | ICD-10-CM | POA: Diagnosis not present

## 2020-04-26 DIAGNOSIS — K7031 Alcoholic cirrhosis of liver with ascites: Secondary | ICD-10-CM | POA: Diagnosis not present

## 2020-04-26 DIAGNOSIS — J90 Pleural effusion, not elsewhere classified: Secondary | ICD-10-CM | POA: Clinically undetermined

## 2020-04-26 LAB — CBC
HCT: 25.9 % — ABNORMAL LOW (ref 36.0–46.0)
Hemoglobin: 8.2 g/dL — ABNORMAL LOW (ref 12.0–15.0)
MCH: 36.3 pg — ABNORMAL HIGH (ref 26.0–34.0)
MCHC: 31.7 g/dL (ref 30.0–36.0)
MCV: 114.6 fL — ABNORMAL HIGH (ref 80.0–100.0)
Platelets: 66 10*3/uL — ABNORMAL LOW (ref 150–400)
RBC: 2.26 MIL/uL — ABNORMAL LOW (ref 3.87–5.11)
RDW: 25.7 % — ABNORMAL HIGH (ref 11.5–15.5)
WBC: 9.5 10*3/uL (ref 4.0–10.5)
nRBC: 0 % (ref 0.0–0.2)

## 2020-04-26 LAB — COMPREHENSIVE METABOLIC PANEL
ALT: 33 U/L (ref 0–44)
AST: 58 U/L — ABNORMAL HIGH (ref 15–41)
Albumin: 2 g/dL — ABNORMAL LOW (ref 3.5–5.0)
Alkaline Phosphatase: 195 U/L — ABNORMAL HIGH (ref 38–126)
Anion gap: 7 (ref 5–15)
BUN: 13 mg/dL (ref 8–23)
CO2: 16 mmol/L — ABNORMAL LOW (ref 22–32)
Calcium: 9 mg/dL (ref 8.9–10.3)
Chloride: 111 mmol/L (ref 98–111)
Creatinine, Ser: 1.1 mg/dL — ABNORMAL HIGH (ref 0.44–1.00)
GFR, Estimated: 56 mL/min — ABNORMAL LOW (ref >60)
Glucose, Bld: 115 mg/dL — ABNORMAL HIGH (ref 70–99)
Potassium: 3.9 mmol/L (ref 3.5–5.1)
Sodium: 134 mmol/L — ABNORMAL LOW (ref 135–145)
Total Bilirubin: 9.9 mg/dL — ABNORMAL HIGH (ref 0.3–1.2)
Total Protein: 4.8 g/dL — ABNORMAL LOW (ref 6.5–8.1)

## 2020-04-26 LAB — LIPID PANEL
Cholesterol: 91 mg/dL (ref 0–200)
HDL: 12 mg/dL — ABNORMAL LOW (ref >40)
LDL Cholesterol: 71 mg/dL (ref 0–99)
Total CHOL/HDL Ratio: 7.6 RATIO
Triglycerides: 41 mg/dL (ref <150)
VLDL: 8 mg/dL (ref 0–40)

## 2020-04-26 LAB — MAGNESIUM: Magnesium: 2.3 mg/dL (ref 1.7–2.4)

## 2020-04-26 MED ORDER — ALUM & MAG HYDROXIDE-SIMETH 200-200-20 MG/5ML PO SUSP: 30.0000 mL | Freq: Four times a day (QID) | ORAL | 0 refills | Status: AC | PRN

## 2020-04-26 MED ORDER — LACTULOSE 10 GM/15ML PO SOLN
30.0000 g | Freq: Two times a day (BID) | ORAL | 1 refills | Status: AC
Start: 2020-04-26 — End: ?

## 2020-04-26 MED ORDER — SODIUM BICARBONATE 650 MG PO TABS
650.0000 mg | ORAL_TABLET | Freq: Two times a day (BID) | ORAL | Status: DC
  Administered 2020-04-26: 650 mg via ORAL
  Filled 2020-04-26: qty 1

## 2020-04-26 MED ORDER — OXYCODONE HCL 5 MG PO TABS: 5.0000 mg | ORAL_TABLET | ORAL | 0 refills | Status: AC | PRN

## 2020-04-26 MED ORDER — AMIODARONE HCL 200 MG PO TABS: 200.0000 mg | ORAL_TABLET | Freq: Every day | ORAL | 1 refills | Status: AC

## 2020-04-26 MED ORDER — LORAZEPAM 0.5 MG PO TABS: 0.5000 mg | ORAL_TABLET | ORAL | 0 refills | Status: AC | PRN

## 2020-04-26 MED ORDER — SODIUM BICARBONATE 650 MG PO TABS: 650.0000 mg | ORAL_TABLET | Freq: Two times a day (BID) | ORAL | 0 refills | Status: AC

## 2020-04-26 MED ORDER — PANTOPRAZOLE SODIUM 40 MG PO TBEC: 40.0000 mg | DELAYED_RELEASE_TABLET | Freq: Every day | ORAL | 1 refills | Status: AC

## 2020-04-26 MED ORDER — DULOXETINE HCL 20 MG PO CPEP: 20.0000 mg | ORAL_CAPSULE | Freq: Every day | ORAL | 1 refills | Status: AC

## 2020-04-26 MED ORDER — PANTOPRAZOLE SODIUM 40 MG PO TBEC
40.0000 mg | DELAYED_RELEASE_TABLET | Freq: Every day | ORAL | Status: DC
  Administered 2020-04-26: 40 mg via ORAL
  Filled 2020-04-26: qty 1

## 2020-04-26 NOTE — Progress Notes (Signed)
Progress Note  Patient Name: Dana Watkins Date of Encounter: 04/26/2020  [facility] Cardiologist: Ena Dawley, MD   Subjective   No complaints  Inpatient Medications    Scheduled Meds: .  stroke: mapping our early stages of recovery book   Does not apply Once  . amiodarone  200 mg Oral BID  . Chlorhexidine Gluconate Cloth  6 each Topical Daily  . DULoxetine  20 mg Oral Daily  . lactulose  30 g Oral BID  . mouth rinse  15 mL Mouth Rinse BID  . mupirocin ointment  1 application Nasal BID  . spironolactone  100 mg Oral Daily  . zinc sulfate  220 mg Oral Daily   Continuous Infusions: . cefTRIAXone (ROCEPHIN)  IV Stopped (04/25/20 1356)   PRN Meds: alum & mag hydroxide-simeth, lip balm, LORazepam, morphine injection, oxyCODONE, polyvinyl alcohol   Vital Signs    Vitals:   04/25/20 2000 04/26/20 0000 04/26/20 0400 04/26/20 0619  BP:  136/65  117/67  Pulse: 86 90 85 88  Resp: (!) 23 (!) 25 17 19   Temp: 99 F (37.2 C) 98.9 F (37.2 C) 98.1 F (36.7 C)   TempSrc: Oral Oral Oral   SpO2: 100% 96% 93% 97%  Weight:      Height:        Intake/Output Summary (Last 24 hours) at 04/26/2020 0702 Last data filed at 04/25/2020 2000 Gross per 24 hour  Intake 374.42 ml  Output 450 ml  Net -75.58 ml   Last 3 Weights 04/25/2020 04/24/2020 04/24/2020  Weight (lbs) 167 lb 15.9 oz 170 lb 10.2 oz 179 lb 3.7 oz  Weight (kg) 76.2 kg 77.4 kg 81.3 kg      Telemetry    NSR - Personally Reviewed  ECG    n/a - Personally Reviewed  Physical Exam   GEN: No acute distress.   Neck: No JVD Cardiac: RRR, no murmurs, rubs, or gallops.  Respiratory: Clear to auscultation bilaterally. GI: Soft, nontender, non-distended  MS: No edema; No deformity. Neuro:  Nonfocal  Psych: Normal affect   Labs    High Sensitivity Troponin:   Recent Labs  Lab 04/24/20 0507 04/24/20 1025  TROPONINIHS 27* 32*      Chemistry Recent Labs  Lab 04/24/20 0507 04/25/20 0234  04/26/20 0235  NA 135 135 134*  K 3.4* 3.6 3.9  CL 111 112* 111  CO2 19* 18* 16*  GLUCOSE 106* 113* 115*  BUN 12 13 13   CREATININE 1.17* 1.10* 1.10*  CALCIUM 9.2 9.1 9.0  PROT 4.9* 4.8* 4.8*  ALBUMIN 2.3* 2.0* 2.0*  AST 65* 60* 58*  ALT 35 34 33  ALKPHOS 186* 195* 195*  BILITOT 12.7* 10.5* 9.9*  GFRNONAA 52* 56* 56*  ANIONGAP 5 5 7      Hematology Recent Labs  Lab 04/24/20 0507 04/25/20 0234 04/26/20 0235  WBC 8.5 9.5 9.5  RBC 2.31* 2.54* 2.26*  HGB 8.2* 9.0* 8.2*  HCT 25.1* 28.4* 25.9*  MCV 108.7* 111.8* 114.6*  MCH 35.5* 35.4* 36.3*  MCHC 32.7 31.7 31.7  RDW 26.2* 26.2* 25.7*  PLT 69* 74* 66*    BNPNo results for input(s): BNP, PROBNP in the last 168 hours.   DDimer No results for input(s): DDIMER in the last 168 hours.   Radiology    MR BRAIN WO CONTRAST  Result Date: 04/25/2020 CLINICAL DATA:  Right arm weakness EXAM: MRI HEAD WITHOUT CONTRAST TECHNIQUE: Multiplanar, multiecho pulse sequences of the brain and surrounding structures  were obtained without intravenous contrast. COMPARISON:  Head CT 04/25/2020 FINDINGS: Brain: No acute infarct, mass effect or extra-axial collection. Fewer than 5 scattered microhemorrhages in a nonspecific pattern. There is multifocal hyperintense T2-weighted signal within the white matter. Parenchymal volume and CSF spaces are normal. The midline structures are normal. Vascular: Major flow voids are preserved. Skull and upper cervical spine: Normal calvarium and skull base. Visualized upper cervical spine and soft tissues are normal. Sinuses/Orbits:Posterior right nasal cavity polyp. Advance mucosal thickening of the right maxillary sinus. Normal orbits. IMPRESSION: 1. No acute intracranial abnormality. 2. Multifocal hyperintense T2-weighted signal within the white matter, most commonly indicating chronic microvascular ischemia. 3. Posterior right nasal cavity polyp and mucosal thickening of the right maxillary sinus. Electronically Signed    By: Ulyses Jarred M.D.   On: 04/25/2020 23:35   DG CHEST PORT 1 VIEW  Result Date: 04/24/2020 CLINICAL DATA:  Pleural effusion. EXAM: PORTABLE CHEST 1 VIEW COMPARISON:  February 20, 2020. FINDINGS: Stable cardiomediastinal silhouette. No pneumothorax is noted. Right lung is clear. Diffuse opacity of left hemithorax is noted consistent with layering effusion. Left basilar atelectasis is present as well. Bony thorax is unremarkable. IMPRESSION: Large layering left pleural effusion is noted with associated left basilar atelectasis. Electronically Signed   By: Marijo Conception M.D.   On: 04/24/2020 16:22   ECHOCARDIOGRAM COMPLETE  Result Date: 04/24/2020    ECHOCARDIOGRAM REPORT   Patient Name:   Dana Watkins Sword Date of Exam: 04/24/2020 Medical Rec #:  [id number]        Height:       68.0 in Accession #:    [id number]       Weight:       170.6 lb Date of Birth:  10/09/1955       BSA:          1.910 m Patient Age:    65 years         BP:           97/66 mmHg Patient Gender: F                HR:           111 bpm. Exam Location:  Inpatient Procedure: 2D Echo, Cardiac Doppler and Color Doppler Indications:    Atrial fibrillation  History:        Patient has prior history of Echocardiogram examinations, most                 recent 08/07/2019. CHF, CAD; Arrythmias:Atrial Fibrillation.                 Alcoholic cirrhosis, anemia.  Sonographer:    Dustin Flock Referring Phys: [phone] DANIEL V THOMPSON  Sonographer Comments: Image acquisition challenging due to respiratory motion. IMPRESSIONS  1. Left ventricular ejection fraction, by estimation, is 50 to 55%. The left ventricle has low normal function. The left ventricle demonstrates regional wall motion abnormalities, possible lateral hypokinesis. Left ventricular diastolic parameters are indeterminate.  2. Right ventricular systolic function is normal. The right ventricular size is normal. There is normal pulmonary artery systolic pressure. The estimated right  ventricular systolic pressure is 09.7 mmHg.  3. Left atrial size was moderately dilated.  4. The mitral valve is normal in structure. No evidence of mitral valve regurgitation. No evidence of mitral stenosis.  5. The aortic valve is tricuspid. Aortic valve regurgitation is not visualized. No aortic stenosis is present.  6. The inferior vena cava is dilated  in size with >50% respiratory variability, suggesting right atrial pressure of 8 mmHg.  7. There is a substantial left pleural effusion.  8. The patient was in atrial fibrillation. FINDINGS  Left Ventricle: Left ventricular ejection fraction, by estimation, is 50 to 55%. The left ventricle has low normal function. The left ventricle demonstrates regional wall motion abnormalities. The left ventricular internal cavity size was normal in size. There is no left ventricular hypertrophy. Left ventricular diastolic parameters are indeterminate. Right Ventricle: The right ventricular size is normal. No increase in right ventricular wall thickness. Right ventricular systolic function is normal. There is normal pulmonary artery systolic pressure. The tricuspid regurgitant velocity is 2.59 m/s, and  with an assumed right atrial pressure of 8 mmHg, the estimated right ventricular systolic pressure is 62.2 mmHg. Left Atrium: Left atrial size was moderately dilated. Right Atrium: Right atrial size was normal in size. Pericardium: There is a substantial left pleural effusion. There is no evidence of pericardial effusion. Mitral Valve: The mitral valve is normal in structure. No evidence of mitral valve regurgitation. No evidence of mitral valve stenosis. Tricuspid Valve: The tricuspid valve is normal in structure. Tricuspid valve regurgitation is trivial. Aortic Valve: The aortic valve is tricuspid. Aortic valve regurgitation is not visualized. No aortic stenosis is present. Pulmonic Valve: The pulmonic valve was not well visualized. Pulmonic valve regurgitation is not  visualized. Aorta: The aortic root is normal in size and structure. Venous: The inferior vena cava is dilated in size with greater than 50% respiratory variability, suggesting right atrial pressure of 8 mmHg. IAS/Shunts: No atrial level shunt detected by color flow Doppler.  LEFT VENTRICLE PLAX 2D LVIDd:         4.70 cm  Diastology LVIDs:         2.90 cm  LV e' medial:    5.77 cm/s LV PW:         1.00 cm  LV E/e' medial:  17.5 LV IVS:        1.00 cm  LV e' lateral:   12.90 cm/s LVOT diam:     2.40 cm  LV E/e' lateral: 7.8 LV SV:         95 LV SV Index:   50 LVOT Area:     4.52 cm  RIGHT VENTRICLE RV Basal diam:  2.60 cm RV S prime:     8.16 cm/s TAPSE (M-mode): 1.8 cm LEFT ATRIUM             Index       RIGHT ATRIUM           Index LA diam:        5.10 cm 2.67 cm/m  RA Area:     17.20 cm LA Vol (A2C):   89.8 ml 47.01 ml/m RA Volume:   43.10 ml  22.56 ml/m LA Vol (A4C):   85.6 ml 44.81 ml/m LA Biplane Vol: 88.1 ml 46.12 ml/m  AORTIC VALVE LVOT Vmax:   120.00 cm/s LVOT Vmean:  89.900 cm/s LVOT VTI:    0.211 m  AORTA Ao Root diam: 2.90 cm MITRAL VALVE                TRICUSPID VALVE MV Area (PHT): 6.54 cm     TR Peak grad:   26.8 mmHg MV Decel Time: 116 msec     TR Vmax:        259.00 cm/s MV E velocity: 101.00 cm/s MV A velocity: 63.80 cm/s   SHUNTS  MV E/A ratio:  1.58         Systemic VTI:  0.21 m                             Systemic Diam: 2.40 cm Loralie Champagne MD Electronically signed by Loralie Champagne MD Signature Date/Time: 04/24/2020/6:04:39 PM    Final    CT HEAD CODE STROKE WO CONTRAST  Result Date: 04/25/2020 CLINICAL DATA:  Code stroke.  Right arm weakness. EXAM: CT HEAD WITHOUT CONTRAST TECHNIQUE: Contiguous axial images were obtained from the base of the skull through the vertex without intravenous contrast. COMPARISON:  12/09/2018 FINDINGS: Brain: There is no evidence of an acute infarct, intracranial hemorrhage, mass, midline shift, or extra-axial fluid collection. Mild cerebral atrophy has  slightly progressed. Hypodensities in the cerebral white matter bilaterally are similar to the prior study and are nonspecific but compatible with mild chronic small vessel ischemic disease. Vascular: Calcified atherosclerosis at the skull base. No hyperdense vessel. Skull: No fracture or suspicious osseous lesion. Sinuses/Orbits: Chronic right maxillary sinusitis with persistent near complete opacification of the sinus. New low-density soft tissue extending from the region of the right maxillary sinus ostium into the posterior nasal cavity, incompletely imaged. Clear mastoid air cells. Unremarkable orbits. Other: None. ASPECTS [facility] Stroke Program Early CT Score) - Ganglionic level infarction (caudate, lentiform nuclei, internal capsule, insula, M1-M3 cortex): 7 - Supraganglionic infarction (M4-M6 cortex): 3 Total score (0-10 with 10 being normal): 10 IMPRESSION: 1. No evidence of acute intracranial abnormality. 2. ASPECTS is 10. 3. Mild chronic small vessel ischemic disease and cerebral atrophy. 4. Chronic right maxillary sinusitis with new soft tissue extending from the region of the right maxillary sinus ostium into the nasal cavity potentially reflecting a polyp. These results were communicated to Dr. Irine Seal at 12:20 pm on 04/25/2020 by text page via the [facility] messaging system. Electronically Signed   By: Logan Bores M.D.   On: 04/25/2020 12:20    Cardiac Studies     Patient Profile        65 y.o. female with a hx of liver cirrhosiswho is being seen today for the evaluation of atrial fibrillationat the request of Dr. Grandville Silos.  Assessment & Plan    1. Afib with RVR - has been on amio, converted to oral 270m bid yesterday per Dr SMarlou Porchwith hopeful plans for short course given her chronic liver disease - has not been started on anticoag due to chronic liver disease and thrombocytopenia. She also has a recent [facility]  2. Elevated troponin - suspect demanid ishcemia, no further testing planned -Per last cath 05/12/2017 diffuse nonobstrcuttive CAD   3. EtoH cirrhosis - per primary team - palliaitve following, from notes there are discussions about possible hospice  4. Chronic systolic HF - 41/6109echo LVEF 50-55%, normal RV  5. Acute weakness - episode last night, ongoing workup per neuro -CT and MRI no acute findings   We will follow telemetry alone tomorrow.   For questions or updates, please contact CPine PointPlease consult [email or web address] for contact info under        Signed, BCarlyle Dolly MD  04/26/2020, 7:02 AM

## 2020-04-26 NOTE — TOC Progression Note (Signed)
Transition of Care [facility]) - Progression Note    Patient Details  Name: Dana Watkins MRN: [id number] Date of Birth: 1955-11-11  Transition of [facility]) CM/SW Contact  Joaquin Courts, RN Phone Number: 04/26/2020, 2:00 PM  Clinical Narrative:    CM spoke with daughter [name] who states patient previously active with [facility] (states was revoked) for hospice and has equipment in the home including 3in1, walker, wheelchair, and hospital bed.  Daughter is at bedside and able to transport patient home at discharge.  Spoke with [facility] rep and asked for patient to resume hospice services.  No further TOC needs at this time.   Expected Discharge Plan: Home w Hospice Care Barriers to Discharge: No Barriers Identified  Expected Discharge Plan and Services Expected Discharge Plan: [place]   Discharge Planning Services: CM Consult   Living arrangements for the past 2 months: Apartment                                       Social Determinants of Health (SDOH) Interventions    Readmission Risk Interventions Readmission Risk Prevention Plan 08/14/2019 12/11/2018  Transportation Screening Complete Complete  PCP or Specialist Appt within 3-5 Days Complete Complete  HRI or [place] Complete Complete  Social Work Consult for [place] Planning/Counseling Complete Complete  Palliative Care Screening Not Applicable Not Applicable  Medication Review Press photographer) Complete Referral to Pharmacy  Some recent data might be hidden

## 2020-04-26 NOTE — Progress Notes (Signed)
Responding to a Chaplain referral Chaplain offered a ministry of support to family.  At first patient was not alert but came around when pt's daughter explained Chaplain was at bedside.  Pt began explaining she didn't want to stay here any more and didn't know why the Lord hasn't called her home.  Pt asked Chaplain to pray that the Reita Cliche would take her soon.  Chaplain responded to pt's request with prayer at bedside while family (daughter pt's mother) gathered round.  [place]

## 2020-04-26 NOTE — Discharge Instructions (Signed)
Hematoma A hematoma is a collection of blood. A hematoma can happen:  Under the skin.  In an organ.  In a body space.  In a joint space.  In other tissues. The blood can thicken (clot) to form a lump that you can see and feel. The lump is often hard and may become sore and tender. The lump can be very small or very big. Most hematomas get better in a few days to weeks. However, some hematomas may be serious and need medical care. What are the causes? This condition is caused by:  An injury.  Blood that leaks under the skin.  Problems from surgeries.  Medical conditions that cause bleeding or bruising. What increases the risk? You are more likely to develop this condition if:  You are an older adult.  You use medicines that thin your blood. What are the signs or symptoms? Symptoms depend on where the hematoma is in your body.  If the hematoma is under the skin, there is: ? A firm lump on the body. ? Pain and tenderness in the area. ? Bruising. The skin above the lump may be blue, dark blue, purple-red, or yellowish.  If the hematoma is deep in the tissues or body spaces, there may be: ? Blood in the stomach. This may cause pain in the belly (abdomen), weakness, passing out (fainting), and shortness of breath. ? Blood in the head. This may cause a headache, weakness, trouble speaking or understanding speech, or passing out.   How is this diagnosed? This condition is diagnosed based on:  Your medical history.  A physical exam.  Imaging tests, such as ultrasound or CT scan.  Blood tests. How is this treated? Treatment depends on the cause, size, and location of the hematoma. Treatment may include:  Doing nothing. Many hematomas go away on their own without treatment.  Surgery or close monitoring. This may be needed for large hematomas or hematomas that affect the body's organs.  Medicines. These may be given if a medical condition caused the hematoma. Follow  these instructions at home: Managing pain, stiffness, and swelling  If told, put ice on the area. ? Put ice in a plastic bag. ? Place a towel between your skin and the bag. ? Leave the ice on for 20 minutes, 2-3 times a day for the first two days.  If told, put heat on the affected area after putting ice on the area for two days. Use the heat source that your doctor tells you to use. This could be a moist heat pack or a heating pad. To do this: ? Place a towel between your skin and the heat source. ? Leave the heat on for 20-30 minutes. ? Remove the heat if your skin turns bright red. This is very important if you are unable to feel pain, heat, or cold. You may have a greater risk of getting burned.  Raise (elevate) the affected area above the level of your heart while you are sitting or lying down.  Wrap the affected area with an elastic bandage, if told by your doctor. Do not wrap the bandage too tightly.  If your hematoma is on a leg or foot and is painful, your doctor may give you crutches. Use them as told by your doctor.   General instructions  Take over-the-counter and prescription medicines only as told by your doctor.  Keep all follow-up visits as told by your doctor. This is important. Contact a doctor if:  You  have a fever.  The swelling or bruising gets worse.  You start to get more hematomas. Get help right away if:  Your pain gets worse.  Your pain is not getting better with medicine.  Your skin over the hematoma breaks or starts to bleed.  Your hematoma is in your chest or belly and you: ? Pass out. ? Feel weak. ? Become short of breath.  You have a hematoma on your scalp that is caused by a fall or injury, and you: ? Have a headache that gets worse. ? Have trouble speaking or understanding speech. ? Become less alert or you pass out. Summary  A hematoma is a collection of blood in any part of your body.  Most hematomas get better on their own in a  few days to weeks. Some may need medical care.  Follow instructions from your doctor about how to care for your hematoma.  Contact a doctor if the swelling or bruising gets worse, or if you are short of breath. This information is not intended to replace advice given to you by your health care provider. Make sure you discuss any questions you have with your health care provider. Document Revised: 06/02/2017 Document Reviewed: 06/02/2017 Elsevier Patient Education  2021 [employer].

## 2020-04-26 NOTE — Progress Notes (Signed)
Hydrologist [facility]) Hospital Liaison: RN note    Notified by Transition of Care Manger of patient/family request for [facility] services at home after discharge. Chart and patient information under review by [facility] physician. Hospice eligibility pending currently.    Spoke to [place] to initiate education related to hospice philosophy, services and team approach to care.   Gracie expressed that they have previously been with our services but revoked due to seeking possible transplant at [facility]. However this is no longer an option and they would like hospice within the home and comfort care.Gracie verbalized understanding of information given.  Per discussion, plan is for discharge to home by [facility].   Please send signed and completed DNR form home with patient/family.   Patient will need prescriptions for discharge comfort medications.     There are no DME needs at this time.   [facility] aware of the above. Please notify [facility] when patient is ready to leave the unit at discharge. (Call [phone] or [phone] after 5pm.) [facility] information and contact numbers given to Gracie .      Please call with any hospice related questions.     Thank you for this referral.     Clementeen Hoof, BSN, [facility] (listed on [facility] under Hospice and [place] [facility] [phone]  [phone]

## 2020-04-26 NOTE — Progress Notes (Signed)
Patient belongings given to the patient. Patient is free of pain and vital signs are stable. Patient discharge instructions, prescriptions ,and education provided by the RN. Patient verbalized understanding. Patient was taken down by wheelchair and safely assisted into their vehicle.

## 2020-04-26 NOTE — Progress Notes (Signed)
PROGRESS NOTE    Dana Watkins  XBJ:[id number] DOB: 12-01-1955 DOA: 04/20/2020 PCP: Lawerance Cruel, MD    Chief Complaint  Patient presents with  . Vomiting    Brief Narrative:  65 year old female with past medical history of of alcoholic cirrhosis(complicated by hepatic encephalopathyandchronic thrombocytopenia),chronic kidney disease stage IIIa, alcohol abuse, coronary artery disease(nonobstrucive mulivessel disease via cath 03/863),HQIONGEX and diastolic congestive heart failure(EF 40-45% with B2WU)XLK recent complication of rectus sheath hematoma who presents to [facility] emergency department with abdominal pain and vomiting. Patietn was found to be thrombocytopenic with elevated INR in the setting of rectus sheath hematoma. GI was consulted   Assessment & Plan:   Principal Problem:   Acute blood loss anemia Active Problems:   Thrombocytopenia ([place])   Hypokalemia   Coronary artery disease involving native coronary artery of native heart without angina pectoris   Hepatic encephalopathy (HCC)   Rectus sheath hematoma   Alcoholic cirrhosis of liver with ascites (HCC)   Chronic kidney disease, stage 3a (HCC)   History of alcohol abuse   Coagulopathy (HCC)   Hypomagnesemia   Chronic combined systolic (congestive) and diastolic (congestive) heart failure (HCC)   AF (paroxysmal atrial fibrillation) (HCC)   Prolonged QT interval   Weakness of right upper extremity   Pleural effusion  1 acute blood loss anemia secondary to rectus sheath hematoma in the setting of coagulopathy and thrombocytopenia -CT abdomen and pelvis revealed enlarging rectus sheath hematoma despite embolization at [facility] last week.- -Patient noted to have a significant drop in hemoglobin on presentation at 6.6 from 7.8 on discharge from [facility]. -Patient placed on vitamin K with INR trending down currently at 2.5. -Repeat INR tomorrow. -Status post transfusion 1 unit packed red blood  cells, 1 unit of platelets, 2 units of cryoprecipitate at time of presentation. -Hemoglobin currently stable at 8.2 from 9.0 from 8.2 with a platelet count of 60. -Dr. Cyd Silence discussed CT findings with Dr. Anselm Pancoast with IR who did not feel that current hematoma is expansive enough to warrant intervention especially with recent intervention that was done. -GI following and recommended continued supportive care with outpatient follow-up with [facility] hepatology.  2.  Hypokalemia -Patient noted to have recurrent severe hypokalemia. -Potassium repleted currently at 3.9.   -Resume spironolactone today.  3.  Coronary artery disease -Currently chest pain-free. -Not on antiplatelet therapy due to coagulopathy. -Spironolactone was held however will resume today.  Monitor blood pressure.  4.  Hepatic encephalopathy -Improved clinically on lactulose.   -Lactulose decreased to twice daily per GI with goal of 2-3 bowel movements per day.  5.  Decompensated alcoholic cirrhosis of the liver with ascites -Patient with advanced disease with numerous complications including hepatic encephalopathy, chronic thrombocytopenia, coagulopathy -MELD score of 33, suggestive of 52.6% 58-monthmortality. -[facility] -PT consulted. -GI not recommending paracentesis at this time. -Continue SBP prophylaxis with IV Rocephin 1 g daily. -Continue 2 g sodium restricted diet. -Spironolactone held secondary to A. fib with RVR and borderline to low blood pressure. -Resume spironolactone today. -Patient received a dose of Lasix 40 mg IV x1 after being given IV albumin (04/23/2020) evening with good urine output and improvement with abdominal distention and lower extremity edema.   -Patient stated had a urine output of close to 2 L however 800 cc recorded.  -Patient seen by palliative care. -Patient stated the evening of 04/24/2020, that she was told she was taken off the transplant list and was  very tearful and felt very hopeless. -Outpatient follow-up at [facility] transplant center/hepatology. -GI was following but have signed off.  6.  Chronic kidney disease stage IIIa -Renal function close to baseline. -Minimize nephrotoxic agents.   -Strict I's and O's.   -Spironolactone was held due to soft/borderline blood pressure and A. fib and will be resumed today.   7.  History of alcohol abuse -Patient noted to have been abstinent from drinking since the holidays. -Continue to recommend complete abstinence.  8.  Chronic combined systolic and diastolic heart failure -Noted most of patient's anasarca/volume overload secondary to low oncotic pressure from liver disease and not due to heart failure -Patient's abdominal distention and ascites improved after dose of IV albumin followed by 40 of Lasix IV x1 (04/23/2020).  -Abdominal distention slowly building back up however patient currently asymptomatic - spironolactone was held and to be resumed today. -Follow.   9.  Atrial fibrillation with RVR -Patient noted to go in A. fib with RVR the evening of 04/23/2020 with heart rates in the 160s to 170s received a total of 7.5 mg IV Lopressor.  Patient's home regimen Coreg resumed.  Patient noted to be in A. fib with RVR with heart rates in the 130s to 160s during the day (04/24/2020) however remained asymptomatic.  Patient received a dose of IV Lopressor and subsequently placed on 2 IV amiodarone drip and transferred to the stepdown unit.   -Cardiac enzymes obtain elevated but somewhat flattened likely demand ischemia in the setting of A. fib with RVR.   -2D echo obtained with EF of 50 to 55%, regional wall motion abnormalities of the left ventricle possible lateral hypokinesis, moderately dilated left atrial size.  -Patient received a 250 cc bolus of normal saline as patient noted to be hypotensive with improvement with blood pressure. -Patient placed on amiodarone drip and converted to sinus rhythm  the morning of 04/25/2020.   -Patient transition to amiodarone 200 mg twice daily per cardiology recommendations and could likely decrease to 200 daily on discharge.  -Coreg discontinued per cardiology. -Patient not on anticoagulation due to coagulopathy. -Cardiology following and appreciate input and recommendations.  10.  Prolonged QT interval -Patient noted to have a markedly prolonged QT interval on admission likely felt exacerbated by electrolyte abnormalities. -Repeat EKG with resolution of QT prolongation. -Currently on IV Rocephin for SBP prophylaxis and ciprofloxacin on hold. -Likely resume psychotropic medications on discharge.  11.  Hypokalemia -Likely secondary to diuretics. -Potassium at 3.9.  Magnesium at 2.3.  Follow.  12.  Right upper extremity weakness -Patient noted to have sudden onset right upper extremity weakness around 1130 on 04/25/2020 which was unable to move her right upper extremity and at that time patient felt likely related to IV in her arm. -Code stroke activated per RN at that time patient subsequently underwent head CT and MRI brain which was unremarkable. -Telemetry neurology consulted. -Clinically improved with resolution of right upper extremity weakness. -No further work-up needed at this time.  13. atypical left-sided hepatic hydrothorax/left pleural effusion -Noted on chest x-ray from 04/24/2020. -Patient currently asymptomatic. -Patient seen in consultation by pulmonary who reviewed prior chest x-rays and several pulmonary notes and it was noted that patient had a hemothorax 07/2019 with chest tube placement with repeat thoracentesis 08/2019 which was noted to be transudative. -Per pulmonary no role for thoracentesis at this time -Outpatient follow-up with pulmonary.  14.  GERD -Patient with complaints of indigestion. -Place on PPI daily. -Maalox as needed.  DVT prophylaxis: INR supratherapeutic at 2.5 Code Status: DNR Family Communication:  Updated patient and daughter at bedside. Disposition:   Status is: Inpatient    Dispo: The patient is from: Home              Anticipated d/c is to: Home with hospice following.              Patient currently with rectus sheath hematoma, H&H being monitored, decompensated cirrhosis, palliative following and to determine disposition of home with hospice versus residential hospice home.  Not stable for discharge.    Difficult to place patient no       Consultants:   Palliative care: Dr. Rowe Pavy 04/22/2020  Gastroenterology: Dr. Michail Sermon 04/21/2020  Cardiology: Dr. Marlou Porch 04/24/2020  Telemetry neurology: Dr.Khaliqdina 04/25/2020  Pulmonary: Dr. Silas Flood 04/24/2020  Procedures:   CT abdomen and pelvis 04/21/2020  Transfusion of 2 units cryoprecipitate 04/21/2020  Transfusion 1 unit packed red blood cells 04/22/2018  Transfusion 1 unit platelets 04/21/2020  2D echo 04/24/2020  CT head 04/25/2020  MRI head 04/25/2020  Antimicrobials:   IV Rocephin 04/21/2020 >>>>     Subjective: Laying in bed.  Denies any chest pain.  No shortness of breath.  Stated she sat up this morning and ate breakfast.  Complaining of indigestion.  No bleeding.  No abdominal pain.  Daughter at bedside.   Objective: Vitals:   04/26/20 0000 04/26/20 0400 04/26/20 0619 04/26/20 0830  BP: 136/65  117/67   Pulse: 90 85 88   Resp: (!) 25 17 19    Temp: 98.9 F (37.2 C) 98.1 F (36.7 C)  97.7 F (36.5 C)  TempSrc: Oral Oral  Axillary  SpO2: 96% 93% 97%   Weight:      Height:        Intake/Output Summary (Last 24 hours) at 04/26/2020 0936 Last data filed at 04/25/2020 2000 Gross per 24 hour  Intake 0 ml  Output 450 ml  Net -450 ml   Filed Weights   04/24/20 0500 04/24/20 0900 04/25/20 0500  Weight: 81.3 kg 77.4 kg 76.2 kg    Examination:  General exam: : Jaundiced. Respiratory system: Decreased breath sounds in the left base.  No wheezing.  No crackles.  No rhonchi.  Speaking in full  sentences. Cardiovascular system: RRR no murmurs rubs or gallops.  No JVD.  No lower extremity edema.  Gastrointestinal system: Abdomen is soft, mildly distended, nontender, positive bowel sounds.  Ecchymosis noted on mid to left abdomen.  No rebound.  No guarding.  Central nervous system: Alert and oriented. No focal neurological deficits. Extremities: Symmetric 5 x 5 power. Skin: Jaundiced Psychiatry: Judgement and insight appear normal. Mood & affect appropriate.   Data Reviewed: I have personally reviewed following labs and imaging studies  CBC: Recent Labs  Lab 04/20/20 2351 04/21/20 0948 04/21/20 1619 04/21/20 2157 04/22/20 0217 04/23/20 0535 04/24/20 0507 04/25/20 0234 04/26/20 0235  WBC 13.0*   < > 9.3   < > 9.3 9.0 8.5 9.5 9.5  NEUTROABS 10.3*  --  6.6  --   --   --  5.4 6.1  --   HGB 6.6*   < > 7.9*   < > 7.8* 8.0* 8.2* 9.0* 8.2*  HCT 20.0*   < > 24.3*   < > 24.4* 25.1* 25.1* 28.4* 25.9*  MCV 108.1*   < > 105.7*   < > 107.0* 110.6* 108.7* 111.8* 114.6*  PLT 47*   < > 77*   < >  71* 70* 69* 74* 66*   < > = values in this interval not displayed.    Basic Metabolic Panel: Recent Labs  Lab 04/20/20 2351 04/21/20 0948 04/22/20 0217 04/23/20 0535 04/24/20 0507 04/25/20 0234 04/26/20 0235  NA 133* 133* 138 134* 135 135 134*  K 2.8* 3.9 3.7 3.9 3.4* 3.6 3.9  CL 111 110 113* 112* 111 112* 111  CO2 17* 17* 16* 18* 19* 18* 16*  GLUCOSE 87 89 105* 106* 106* 113* 115*  BUN 14 16 12 12 12 13 13   CREATININE 1.18* 0.81 1.11* 1.04* 1.17* 1.10* 1.10*  CALCIUM 8.5* 9.3 9.6 9.3 9.2 9.1 9.0  MG 1.6* 2.2  --   --  2.0 1.9 2.3    GFR: Estimated Creatinine Clearance: 52.1 mL/min (A) (by C-G formula based on SCr of 1.1 mg/dL (H)).  Liver Function Tests: Recent Labs  Lab 04/22/20 0217 04/23/20 0535 04/24/20 0507 04/25/20 0234 04/26/20 0235  AST 73* 73* 65* 60* 58*  ALT 40 42 35 34 33  ALKPHOS 197* 213* 186* 195* 195*  BILITOT 13.6* 12.1* 12.7* 10.5* 9.9*  PROT 5.0*  4.9* 4.9* 4.8* 4.8*  ALBUMIN 2.2* 2.0* 2.3* 2.0* 2.0*    CBG: No results for input(s): GLUCAP in the last 168 hours.   Recent Results (from the past 240 hour(s))  SARS CORONAVIRUS 2 (TAT 6-24 HRS) Nasopharyngeal Nasopharyngeal Swab     Status: None   Collection Time: 04/21/20  5:28 AM   Specimen: Nasopharyngeal Swab  Result Value Ref Range Status   SARS Coronavirus 2 NEGATIVE NEGATIVE Final    Comment: (NOTE) SARS-CoV-2 target nucleic acids are NOT DETECTED.  The SARS-CoV-2 RNA is generally detectable in upper and lower respiratory specimens during the acute phase of infection. Negative results do not preclude SARS-CoV-2 infection, do not rule out co-infections with other pathogens, and should not be used as the sole basis for treatment or other patient management decisions. Negative results must be combined with clinical observations, patient history, and epidemiological information. The expected result is Negative.  Fact Sheet for Patients: SugarRoll.be  Fact Sheet for Healthcare Providers: https://www.woods-mathews.com/  This test is not yet approved or cleared by the [place] FDA and  has been authorized for detection and/or diagnosis of SARS-CoV-2 by FDA under an Emergency Use Authorization (EUA). This EUA will remain  in effect (meaning this test can be used) for the duration of the COVID-19 declaration under Se ction 564(b)(1) of the Act, 21 U.S.C. section 360bbb-3(b)(1), unless the authorization is terminated or revoked sooner.  Performed at [facility], [place] [address] [place], [place] [address]   MRSA PCR Screening     Status: Abnormal   Collection Time: 04/21/20  6:25 AM   Specimen: Nasal Mucosa; Nasopharyngeal  Result Value Ref Range Status   MRSA by PCR POSITIVE (A) NEGATIVE Final    Comment:        The GeneXpert MRSA Assay (FDA approved for NASAL specimens only), is one component of a comprehensive  MRSA colonization surveillance program. It is not intended to diagnose MRSA infection nor to guide or monitor treatment for MRSA infections. RESULT CALLED TO, READ BACK BY AND VERIFIED WITH: CAUDLE,L. RN AT 1127 04/21/20 MULLINS,T Performed at [facility], [place] [address], [place], [place] [address]          Radiology Studies: MR BRAIN WO CONTRAST  Result Date: 04/25/2020 CLINICAL DATA:  Right arm weakness EXAM: MRI HEAD WITHOUT CONTRAST TECHNIQUE: Multiplanar,  multiecho pulse sequences of the brain and surrounding structures were obtained without intravenous contrast. COMPARISON:  Head CT 04/25/2020 FINDINGS: Brain: No acute infarct, mass effect or extra-axial collection. Fewer than 5 scattered microhemorrhages in a nonspecific pattern. There is multifocal hyperintense T2-weighted signal within the white matter. Parenchymal volume and CSF spaces are normal. The midline structures are normal. Vascular: Major flow voids are preserved. Skull and upper cervical spine: Normal calvarium and skull base. Visualized upper cervical spine and soft tissues are normal. Sinuses/Orbits:Posterior right nasal cavity polyp. Advance mucosal thickening of the right maxillary sinus. Normal orbits. IMPRESSION: 1. No acute intracranial abnormality. 2. Multifocal hyperintense T2-weighted signal within the white matter, most commonly indicating chronic microvascular ischemia. 3. Posterior right nasal cavity polyp and mucosal thickening of the right maxillary sinus. Electronically Signed   By: Ulyses Jarred M.D.   On: 04/25/2020 23:35   DG CHEST PORT 1 VIEW  Result Date: 04/24/2020 CLINICAL DATA:  Pleural effusion. EXAM: PORTABLE CHEST 1 VIEW COMPARISON:  February 20, 2020. FINDINGS: Stable cardiomediastinal silhouette. No pneumothorax is noted. Right lung is clear. Diffuse opacity of left hemithorax is noted consistent with layering effusion. Left basilar atelectasis is present as well. Bony thorax  is unremarkable. IMPRESSION: Large layering left pleural effusion is noted with associated left basilar atelectasis. Electronically Signed   By: Marijo Conception M.D.   On: 04/24/2020 16:22   ECHOCARDIOGRAM COMPLETE  Result Date: 04/24/2020    ECHOCARDIOGRAM REPORT   Patient Name:   PARNEET GLANTZ Faubert Date of Exam: 04/24/2020 Medical Rec #:  [id number]        Height:       68.0 in Accession #:    [id number]       Weight:       170.6 lb Date of Birth:  February 24, 1955       BSA:          1.910 m Patient Age:    68 years         BP:           97/66 mmHg Patient Gender: F                HR:           111 bpm. Exam Location:  Inpatient Procedure: 2D Echo, Cardiac Doppler and Color Doppler Indications:    Atrial fibrillation  History:        Patient has prior history of Echocardiogram examinations, most                 recent 08/07/2019. CHF, CAD; Arrythmias:Atrial Fibrillation.                 Alcoholic cirrhosis, anemia.  Sonographer:    Dustin Flock Referring Phys: [phone] Danni Leabo V Nekeisha Aure  Sonographer Comments: Image acquisition challenging due to respiratory motion. IMPRESSIONS  1. Left ventricular ejection fraction, by estimation, is 50 to 55%. The left ventricle has low normal function. The left ventricle demonstrates regional wall motion abnormalities, possible lateral hypokinesis. Left ventricular diastolic parameters are indeterminate.  2. Right ventricular systolic function is normal. The right ventricular size is normal. There is normal pulmonary artery systolic pressure. The estimated right ventricular systolic pressure is 77.8 mmHg.  3. Left atrial size was moderately dilated.  4. The mitral valve is normal in structure. No evidence of mitral valve regurgitation. No evidence of mitral stenosis.  5. The aortic valve is tricuspid. Aortic valve regurgitation is not visualized. No aortic stenosis is present.  6. The inferior vena cava is dilated in size with >50% respiratory variability, suggesting right atrial  pressure of 8 mmHg.  7. There is a substantial left pleural effusion.  8. The patient was in atrial fibrillation. FINDINGS  Left Ventricle: Left ventricular ejection fraction, by estimation, is 50 to 55%. The left ventricle has low normal function. The left ventricle demonstrates regional wall motion abnormalities. The left ventricular internal cavity size was normal in size. There is no left ventricular hypertrophy. Left ventricular diastolic parameters are indeterminate. Right Ventricle: The right ventricular size is normal. No increase in right ventricular wall thickness. Right ventricular systolic function is normal. There is normal pulmonary artery systolic pressure. The tricuspid regurgitant velocity is 2.59 m/s, and  with an assumed right atrial pressure of 8 mmHg, the estimated right ventricular systolic pressure is 03.5 mmHg. Left Atrium: Left atrial size was moderately dilated. Right Atrium: Right atrial size was normal in size. Pericardium: There is a substantial left pleural effusion. There is no evidence of pericardial effusion. Mitral Valve: The mitral valve is normal in structure. No evidence of mitral valve regurgitation. No evidence of mitral valve stenosis. Tricuspid Valve: The tricuspid valve is normal in structure. Tricuspid valve regurgitation is trivial. Aortic Valve: The aortic valve is tricuspid. Aortic valve regurgitation is not visualized. No aortic stenosis is present. Pulmonic Valve: The pulmonic valve was not well visualized. Pulmonic valve regurgitation is not visualized. Aorta: The aortic root is normal in size and structure. Venous: The inferior vena cava is dilated in size with greater than 50% respiratory variability, suggesting right atrial pressure of 8 mmHg. IAS/Shunts: No atrial level shunt detected by color flow Doppler.  LEFT VENTRICLE PLAX 2D LVIDd:         4.70 cm  Diastology LVIDs:         2.90 cm  LV e' medial:    5.77 cm/s LV PW:         1.00 cm  LV E/e' medial:  17.5 LV  IVS:        1.00 cm  LV e' lateral:   12.90 cm/s LVOT diam:     2.40 cm  LV E/e' lateral: 7.8 LV SV:         95 LV SV Index:   50 LVOT Area:     4.52 cm  RIGHT VENTRICLE RV Basal diam:  2.60 cm RV S prime:     8.16 cm/s TAPSE (M-mode): 1.8 cm LEFT ATRIUM             Index       RIGHT ATRIUM           Index LA diam:        5.10 cm 2.67 cm/m  RA Area:     17.20 cm LA Vol (A2C):   89.8 ml 47.01 ml/m RA Volume:   43.10 ml  22.56 ml/m LA Vol (A4C):   85.6 ml 44.81 ml/m LA Biplane Vol: 88.1 ml 46.12 ml/m  AORTIC VALVE LVOT Vmax:   120.00 cm/s LVOT Vmean:  89.900 cm/s LVOT VTI:    0.211 m  AORTA Ao Root diam: 2.90 cm MITRAL VALVE                TRICUSPID VALVE MV Area (PHT): 6.54 cm     TR Peak grad:   26.8 mmHg MV Decel Time: 116 msec     TR Vmax:        259.00 cm/s MV E velocity: 101.00 cm/s  MV A velocity: 63.80 cm/s   SHUNTS MV E/A ratio:  1.58         Systemic VTI:  0.21 m                             Systemic Diam: 2.40 cm Loralie Champagne MD Electronically signed by Loralie Champagne MD Signature Date/Time: 04/24/2020/6:04:39 PM    Final    CT HEAD CODE STROKE WO CONTRAST  Result Date: 04/25/2020 CLINICAL DATA:  Code stroke.  Right arm weakness. EXAM: CT HEAD WITHOUT CONTRAST TECHNIQUE: Contiguous axial images were obtained from the base of the skull through the vertex without intravenous contrast. COMPARISON:  12/09/2018 FINDINGS: Brain: There is no evidence of an acute infarct, intracranial hemorrhage, mass, midline shift, or extra-axial fluid collection. Mild cerebral atrophy has slightly progressed. Hypodensities in the cerebral white matter bilaterally are similar to the prior study and are nonspecific but compatible with mild chronic small vessel ischemic disease. Vascular: Calcified atherosclerosis at the skull base. No hyperdense vessel. Skull: No fracture or suspicious osseous lesion. Sinuses/Orbits: Chronic right maxillary sinusitis with persistent near complete opacification of the sinus. New  low-density soft tissue extending from the region of the right maxillary sinus ostium into the posterior nasal cavity, incompletely imaged. Clear mastoid air cells. Unremarkable orbits. Other: None. ASPECTS [facility] Stroke Program Early CT Score) - Ganglionic level infarction (caudate, lentiform nuclei, internal capsule, insula, M1-M3 cortex): 7 - Supraganglionic infarction (M4-M6 cortex): 3 Total score (0-10 with 10 being normal): 10 IMPRESSION: 1. No evidence of acute intracranial abnormality. 2. ASPECTS is 10. 3. Mild chronic small vessel ischemic disease and cerebral atrophy. 4. Chronic right maxillary sinusitis with new soft tissue extending from the region of the right maxillary sinus ostium into the nasal cavity potentially reflecting a polyp. These results were communicated to Dr. Irine Seal at 12:20 pm on 04/25/2020 by text page via the [facility] messaging system. Electronically Signed   By: Logan Bores M.D.   On: 04/25/2020 12:20        Scheduled Meds: .  stroke: mapping our early stages of recovery book   Does not apply Once  . amiodarone  200 mg Oral BID  . Chlorhexidine Gluconate Cloth  6 each Topical Daily  . DULoxetine  20 mg Oral Daily  . lactulose  30 g Oral BID  . mouth rinse  15 mL Mouth Rinse BID  . mupirocin ointment  1 application Nasal BID  . pantoprazole  40 mg Oral Q0600  . sodium bicarbonate  650 mg Oral BID  . spironolactone  100 mg Oral Daily  . zinc sulfate  220 mg Oral Daily   Continuous Infusions: . cefTRIAXone (ROCEPHIN)  IV Stopped (04/25/20 1356)     LOS: 5 days    Time spent: 40 minutes    Irine Seal, MD [facility]   To contact the attending provider between 7A-7P or the covering provider during after hours 7P-7A, please log into the web site [email or web address] and access using universal [place] password for that web site. If you do not have the password, please call the hospital operator.  04/26/2020, 9:36 AM

## 2020-04-26 NOTE — Progress Notes (Signed)
Daily Progress Note   Patient Name: Dana Watkins       Date: 04/26/2020 DOB: [birth year]  Age: 65 y.o. MRN#: [id number] Attending Physician: Eugenie Filler, MD Primary Care Physician: Lawerance Cruel, MD Admit Date: 04/20/2020  Reason for Consultation/Follow-up: Establishing goals of care  Subjective: Patient resting in bed,   awake alert today, her mother and daughter are at bedside, we talked about home with hospice support, patient's mother and daughter are present at the bedside, son went back today, see below.  Length of Stay: 5  Current Medications: Scheduled Meds:  .  stroke: mapping our early stages of recovery book   Does not apply Once  . amiodarone  200 mg Oral BID  . Chlorhexidine Gluconate Cloth  6 each Topical Daily  . DULoxetine  20 mg Oral Daily  . lactulose  30 g Oral BID  . mouth rinse  15 mL Mouth Rinse BID  . pantoprazole  40 mg Oral Q0600  . sodium bicarbonate  650 mg Oral BID  . spironolactone  100 mg Oral Daily  . zinc sulfate  220 mg Oral Daily    Continuous Infusions: . cefTRIAXone (ROCEPHIN)  IV Stopped (04/25/20 1356)    PRN Meds: alum & mag hydroxide-simeth, lip balm, LORazepam, morphine injection, oxyCODONE, polyvinyl alcohol  Physical Exam         Awake alert Wants to go home No distress although labile mood is evident, patient smiles and laughs and then spontaneously starts to cry    Vital Signs: BP 140/81 (BP Location: Left Arm)   Pulse 90   Temp 97.7 F (36.5 C) (Axillary)   Resp (!) 26   Ht 5' 8"  (1.727 m)   Wt 76.2 kg   SpO2 99%   BMI 25.54 kg/m  SpO2: SpO2: 99 % O2 Device: O2 Device: Room Air O2 Flow Rate:    Intake/output summary:   Intake/Output Summary (Last 24 hours) at 04/26/2020 1114 Last data filed at  04/26/2020 0800 Gross per 24 hour  Intake 200 ml  Output 1100 ml  Net -900 ml   LBM: Last BM Date: 04/25/20 Baseline Weight: Weight: 80.1 kg Most recent weight: Weight: 76.2 kg      Palliative performance scale 30%. Palliative Assessment/Data:    Palliative performance scale 30%.  Patient Active  Problem List   Diagnosis Date Noted  . Pleural effusion 04/26/2020  . Weakness of right upper extremity   . Acute blood loss anemia 04/21/2020  . Rectus sheath hematoma 04/21/2020  . Alcoholic cirrhosis of liver with ascites ([place]) 04/21/2020  . Chronic kidney disease, stage 3a ([place]) 04/21/2020  . History of alcohol abuse 04/21/2020  . Coagulopathy ([place]) 04/21/2020  . Hypomagnesemia 04/21/2020  . Chronic combined systolic (congestive) and diastolic (congestive) heart failure ([place]) 04/21/2020  . AF (paroxysmal atrial fibrillation) ([place]) 04/21/2020  . Prolonged QT interval 04/21/2020  . Palliative care by specialist   . Goals of care, counseling/discussion   . Advance care planning   . Abnormal liver function tests   . Liver failure without hepatic coma ([place]) 02/20/2020  . Hepatic encephalopathy ([place]) 02/20/2020  . Fall   . Hemothorax on left 08/24/2019  . Acute on chronic respiratory failure ([place]) 08/07/2019  . Acute coronary syndrome ([place]) 08/06/2019  . Anemia due to stage 3 chronic kidney disease ([place]) 04/16/2019  . Fever 02/21/2019  . Hypophosphatemia   . Recurrent Clostridioides difficile diarrhea 12/11/2018  . Weakness 12/09/2018  . Hyperbilirubinemia 10/31/2018  . C. difficile diarrhea 10/31/2018  . Hepatic cirrhosis ([place]) 10/31/2018  . Systolic murmur at cardiac apex 10/31/2018  . Sepsis ([place]) 10/30/2018  . Normocytic anemia 10/23/2018  . SIRS (systemic inflammatory response syndrome) ([place]) 10/16/2018  . Acute renal failure superimposed on stage 3 chronic kidney disease ([place]) 10/16/2018  . Acute lower UTI 10/16/2018  . Elevated troponin 10/16/2018  . Elevated  d-dimer 10/16/2018  . UTI (urinary tract infection) 06/25/2018  . Community acquired pneumonia of right middle lobe of lung 06/09/2018  . AKI (acute kidney injury) ([place]) 06/09/2018  . GAD (generalized anxiety disorder) 11/06/2017  . Abnormal findings on diagnostic imaging of heart and coronary circulation   . Family history of coronary artery disease 05/09/2017  . DOE (dyspnea on exertion) 05/09/2017  . Coronary artery disease involving native coronary artery of native heart without angina pectoris 03/08/2017  . Acute bronchitis 02/06/2017  . Dehydration   . Diarrhea 02/03/2017  . Nausea & vomiting 02/02/2017  . Arthritis 05/15/2015  . Benign essential hypertension 05/15/2015  . Migraine headache 05/15/2015  . Hypokalemia 11/26/2014  . Alkaline phosphatase elevation 11/26/2014  . Abnormal weight gain 11/26/2014  . Chronic leukopenia 05/28/2014  . Symptomatic anemia 05/28/2014  . Thrombocytopenia ([place]) 04/30/2013  . Elevated liver function tests 04/30/2013    Palliative Care Assessment & Plan   Patient Profile: 65 year old lady with history of alcoholic cirrhosis complicated by hepatic encephalopathy chronic thrombocytopenia stage III chronic kidney disease history of alcohol use, history of coronary artery disease systolic diastolic congestive heart failure ejection fraction 40-45% with recent complication of rectus sheath hematoma for which she underwent embolization procedure at [facility], presented to [facility] emergency department and was admitted to hospital medicine service for abdominal pain and vomiting.  Patient was previously enrolled in hospice/palliative services.  Palliative care consultation has been requested for ongoing goals of care discussions.  Assessment: Acute blood loss anemia on admission from rectus sheath hematoma in the setting of coagulopathy and thrombocytopenia.  Status post embolization procedure at [facility] prior to this hospitalization Decompensated  alcoholic liver disease with ascites History of alcohol use Chronic kidney disease stage III Atrial fibrillation with rapid ventricular response. Generalized deconditioning  Recommendations/Plan: Home with hospice Continue current pain and non pain symptom management options, med history noted.      Code Status:  Code Status Orders  (From admission, onward)         Start     Ordered   04/21/20 0742  Do not attempt resuscitation (DNR)  Continuous       Question Answer Comment  In the event of cardiac or respiratory ARREST Do not call a "code blue"   In the event of cardiac or respiratory ARREST Do not perform Intubation, CPR, defibrillation or ACLS   In the event of cardiac or respiratory ARREST Use medication by any route, position, wound care, and other measures to relive pain and suffering. May use oxygen, suction and manual treatment of airway obstruction as needed for comfort.      04/21/20 0741        Code Status History    Date Active Date Inactive Code Status Order ID Comments User Context   02/28/2020 1026 02/28/2020 1701 DNR [id number]  Rosezella Rumpf, NP Inpatient   02/20/2020 1413 02/28/2020 1025 Full Code [id number]  Lequita Halt, MD ED   08/06/2019 2334 08/14/2019 1919 Full Code [id number]  Dixie Dials, MD ED   02/20/2019 1213 02/23/2019 0015 Full Code [id number]  Reubin Milan, MD ED   12/09/2018 0607 12/11/2018 2112 Full Code [id number]  Jani Gravel, MD Inpatient   10/31/2018 0759 11/02/2018 1601 Full Code [id number]  Vianne Bulls, MD ED   10/16/2018 1953 10/24/2018 1942 Full Code [id number]  Samuella Cota, MD Inpatient   06/25/2018 1950 06/28/2018 1651 Full Code [id number]  Elwyn Reach, MD Inpatient   06/09/2018 1825 06/10/2018 1454 Full Code [id number]  Mariel Aloe, MD Inpatient   05/12/2017 1219 05/12/2017 1927 Full Code [id number]  [name] Booze, MD Inpatient   02/02/2017 2320 02/06/2017 2010 Full Code [id number]  Etta Quill, DO ED   Advance  Care Planning Activity      Prognosis:  less than 6 months.   Discharge Planning: Home with hospice.   Care plan was discussed with Dr. Grandville Silos, patient, her mother and daughter present at the bedside.  Thank you for allowing the Palliative Medicine Team to assist in the care of this patient.   Time In: 9 Time Out: 9.35 Total Time  35 Prolonged Time Billed  no       Greater than 50%  of this time was spent counseling and coordinating care related to the above assessment and plan.  Loistine Chance, MD  Please contact Palliative Medicine Team phone at [phone] for questions and concerns.

## 2020-04-26 NOTE — Discharge Summary (Addendum)
Physician Discharge Summary  Dana Watkins JQB:[id number] DOB: 17-Mar-1955 DOA: 04/20/2020  PCP: Lawerance Cruel, MD  Admit date: 04/20/2020 Discharge date: 04/26/2020  Time spent: 60 minutes  Recommendations for Outpatient Follow-up:  1. Patient be discharged home with hospice following. 2. Follow-up with Dr. Meda Coffee, cardiology in 2 weeks for follow-up on A. Fib. 3. Follow-up at [facility] liver clinic in 2 weeks or as previously scheduled. 4. Follow-up with Dr. Silas Flood, pulmonary, office will call for follow-up appointment for follow-up on pleural effusion. 5. Follow-up with Lawerance Cruel, MD in 2 weeks.  On follow-up patient will need a comprehensive metabolic profile done to follow-up on electrolytes, renal function, LFTs.  Patient also need INR check on follow-up.   Discharge Diagnoses:  Principal Problem:   Acute blood loss anemia Active Problems:   Thrombocytopenia (HCC)   Hypokalemia   Coronary artery disease involving native coronary artery of native heart without angina pectoris   Hepatic encephalopathy (HCC)   Rectus sheath hematoma   Alcoholic cirrhosis of liver with ascites (HCC)   Chronic kidney disease, stage 3a (HCC)   History of alcohol abuse   Coagulopathy (HCC)   Hypomagnesemia   Chronic combined systolic (congestive) and diastolic (congestive) heart failure (HCC)   AF (paroxysmal atrial fibrillation) (HCC)   Prolonged QT interval   Weakness of right upper extremity   Pleural effusion   General weakness   Discharge Condition: Stable  Diet recommendation: 2 g sodium diet  Filed Weights   04/24/20 0500 04/24/20 0900 04/25/20 0500  Weight: 81.3 kg 77.4 kg 76.2 kg    History of present illness:  HPI per Dr. Cyd Silence 65 year old female with past medical history of of alcoholic cirrhosis (complicated by hepatic encephalopathy and chronic thrombocytopenia), chronic kidney disease stage IIIa, alcohol abuse, coronary artery disease (nonobstrucive  mulivessel disease via cath 04/971), systolic and diastolic congestive heart failure (EF 40-45% with G1DD) and recent complication of rectus sheath hematoma who presented to [facility] emergency department with abdominal pain and vomiting.  Patient is a rather poor historian and much of the history is been obtained from the husband who is at the bedside in addition to review of emergency department notes and discussion with emergency department staff.  Of note, patient was recently admitted to [facility] from 4/5 until 4/8.  Patient was sent to the [facility] emergency room by transplant clinic due to severe hypokalemia.  Upon evaluation in the emergency department, patient was found to have abdominal pain and CT imaging revealed a rectus sheath hematoma.  Patient was transfused 2 units of packed red blood cells, given cryoprecipitate and vitamin K and interventional radiology performed an embolization in an effort to stem the bleeding.  Bleed was felt to have stabilized and patient was eventually discharged on 4/8 with Hgb of 7.9.  Patient explains that since her discharge she has had near constant abdominal pain.  Patient describes the abdominal pain as diffuse but worse along the left side of the abdomen.  Pain is "tight" in quality and severe in intensity, worse with movement and improved with rest.  Pain is associated with frequent bouts of nausea, found some nonbilious nonbloody vomiting and inability to tolerate oral intake.  Due to progressively worsening symptoms patient presented to [facility] emergency department.  Upon evaluation in the emergency department patient was found to have progressive anemia with hemoglobin of 6.6.  Patient was found to have worsening coagulopathy with INR 3.8 patient was also found  to have worsening thrombocytopenia with platelet count of 47.  Patient was also found to be hypokalemic with potassium of 2.8.  Potassium replacement  was initiated and packed red blood cell transfusion was ordered and the hospitalist group was then called to assess the patient for admission to the hospital.  The emergency department provider did contact gastroenterology via epic secure chat stated that gastroenterology would evaluate the patient later this morning.  Hospital Course:  1 acute blood loss anemia secondary to rectus sheath hematoma in the setting of coagulopathy and thrombocytopenia -CT abdomen and pelvis revealed enlarging rectus sheath hematoma despite embolization at [facility] last week.- -Patient noted to have a significant drop in hemoglobin on presentation at 6.6 from 7.8 on discharge from [facility]. -Patient placed on vitamin K with INR trending down currently at 2.5. -Status post transfusion 1 unit packed red blood cells, 1 unit of platelets, 2 units of cryoprecipitate at time of presentation. -Hemoglobin stabilized at 8.2 by day of discharge with a platelet count of 60.  -Dr. Cyd Silence discussed CT findings with Dr. Anselm Pancoast with IR who did not feel that current hematoma was expansive enough to warrant intervention especially with recent intervention that was done. -GI was following, and recommended continued supportive care with outpatient follow-up with [facility] hepatology.  2.  Hypokalemia -Patient noted to have recurrent severe hypokalemia. -Potassium repleted and was 3.9 on day of discharge.    3.  Coronary artery disease -Currently chest pain-free. -Not on antiplatelet therapy due to coagulopathy. -Spironolactone was held and subsequently resumed on day of discharge.   4.  Hepatic encephalopathy -Improved clinically on lactulose.   -Lactulose decreased to twice daily per GI with goal of 2-3 bowel movements per day.  5.  Decompensated alcoholic cirrhosis of the liver with ascites -Patient with advanced disease with numerous complications including hepatic encephalopathy, chronic thrombocytopenia, coagulopathy -MELD score of  33, suggestive of 52.6% 52-monthmortality. -[facility] -PT consulted. -GI not recommending paracentesis at this time. -Patient was maintained on SBP prophylaxis with IV Rocephin 1 g daily. - 2 g sodium restricted diet. -Spironolactone held secondary to A. fib with RVR and borderline to low blood pressure. -Spironolactone resumed on day of discharge.  -Patient received a dose of Lasix 40 mg IV x1 after being given IV albumin (04/23/2020) evening with good urine output and improvement with abdominal distention and lower extremity edema.   -Patient seen by palliative care. -Patient stated the evening of 04/24/2020, that she was told she was taken off the transplant list and was very tearful and felt very hopeless. -[facility] -GI was following but have signed off. -Patient followed by palliative care and patient will be discharged home with hospice following.  6.  Chronic kidney disease stage IIIa -Renal function close to baseline. -Minimize nephrotoxic agents.   -Strict I's and O's.   -Spironolactone was held due to soft/borderline blood pressure and A. fib and resumed on discharge.    7.  History of alcohol abuse -Patient noted to have been abstinent from drinking since the holidays. -Continue to recommend complete abstinence.  8.  Chronic combined systolic and diastolic heart failure -Noted most of patient's anasarca/volume overload secondary to low oncotic pressure from liver disease and not due to heart failure -Patient's abdominal distention and ascites improved after dose of IV albumin followed by 40 of Lasix IV x1 (04/23/2020).  -Abdominal distention slowly building back up however patient currently asymptomatic - spironolactone was  held and resumed. -Outpatient follow-up with cardiology.  9.  Atrial fibrillation with RVR -Patient noted to go in A. fib with RVR the evening of 04/23/2020 with  heart rates in the 160s to 170s received a total of 7.5 mg IV Lopressor.  Patient's home regimen Coreg resumed.  Patient noted to be in A. fib with RVR with heart rates in the 130s to 160s during the day (04/24/2020) however remained asymptomatic.  Patient received a dose of IV Lopressor and subsequently placed on 2 IV amiodarone drip and transferred to the stepdown unit.   -Cardiac enzymes obtain elevated but somewhat flattened likely demand ischemia in the setting of A. fib with RVR.   -2D echo obtained with EF of 50 to 55%, regional wall motion abnormalities of the left ventricle possible lateral hypokinesis, moderately dilated left atrial size.  -Patient received a 250 cc bolus of normal saline as patient noted to be hypotensive with improvement with blood pressure. -Patient placed on amiodarone drip and converted to sinus rhythm the morning of 04/25/2020.   -Patient transitioned to amiodarone 200 mg twice daily per cardiology recommendations and will be discharged on amiodarone 200 mg daily per cardiology recommendations.   -Coreg discontinued.   -Patient not a anticoagulation candidate due to coagulopathy, thrombocytopenia, cirrhosis.   -Outpatient follow-up with cardiology.  10.  Prolonged QT interval -Patient noted to have a markedly prolonged QT interval on admission likely felt exacerbated by electrolyte abnormalities. -Repeat EKG with resolution of QT prolongation. -Patient ciprofloxacin was held during the hospitalization and patient maintained on IV Rocephin for SBP prophylaxis.   -Patient's psychotropic medications was also held during the hospitalization.  11.  Hypokalemia -Likely secondary to diuretics. -Repleted.  Outpatient follow-up.   12.  Right upper extremity weakness -Patient noted to have sudden onset right upper extremity weakness around 1130 on 04/25/2020 which was unable to move her right upper extremity and at that time patient felt likely related to IV in her  arm. -Code stroke activated per RN at that time patient subsequently underwent head CT and MRI brain which was unremarkable. -Telemetry neurology consulted who assessed the patient and patient deemed not a TPA candidate due to resolution of symptoms, coagulopathy, thrombocytopenia. -It was felt per telemetry neurologist that patient was not a candidate for antiplatelet agent or anticoagulation due to thrombocytopenia coagulopathy with recent spontaneous rebleeding in her rectus sheath hematoma.. -Patient improved clinically and was back to baseline by day of discharge.    13. atypical left-sided hepatic hydrothorax/left pleural effusion -Noted on chest x-ray from 04/24/2020. -Patient remained asymptomatic. -Patient seen in consultation by pulmonary who reviewed prior chest x-rays and several pulmonary notes and it was noted that patient had a hemothorax 07/2019 with chest tube placement with repeat thoracentesis 08/2019 which was noted to be transudative. -Per pulmonary no role for thoracentesis at this time -Outpatient follow-up with pulmonary.  14.  GERD -Patient with complaints of indigestion. -Patient placed on a PPI which she will be discharged home on as well as Maalox as needed.    Procedures:  CT abdomen and pelvis 04/21/2020  Transfusion of 2 units cryoprecipitate 04/21/2020  Transfusion 1 unit packed red blood cells 04/22/2018  Transfusion 1 unit platelets 04/21/2020  2D echo 04/24/2020  CT head 04/25/2020  MRI head 04/25/2020    Consultations:  Palliative care: Dr. Rowe Pavy 04/22/2020  Gastroenterology: Dr. Michail Sermon 04/21/2020  Cardiology: Dr. Marlou Porch 04/24/2020  Telemetry neurology: Dr.Khaliqdina 04/25/2020  Pulmonary: Dr. Silas Flood 04/24/2020    Discharge Exam: Vitals:  04/26/20 1200 04/26/20 1336  BP:    Pulse: 93   Resp: (!) 21   Temp:  99.1 F (37.3 C)  SpO2: 95%     General: Jaundiced Cardiovascular: Regular rate rhythm no murmurs rubs or  gallops. Respiratory: Decreased breath sounds in the left base otherwise clear.  No wheezing, no crackles, no rhonchi.  Speaking in full sentences.  Discharge Instructions   Discharge Instructions    Diet - low sodium heart healthy   Complete by: As directed    Discharge wound care:   Complete by: As directed    As above   Increase activity slowly   Complete by: As directed      Allergies as of 04/26/2020      Reactions   Sulfa Antibiotics Nausea And Vomiting, Rash, Shortness Of Breath   Patient reports reaction of mild and took place a very long time ago.      Medication List    STOP taking these medications   carvedilol 3.125 MG tablet Commonly known as: Coreg   FLUoxetine 20 MG capsule Commonly known as: PROZAC   morphine CONCENTRATE 10 mg / 0.5 ml concentrated solution   potassium chloride SA 20 MEQ tablet Commonly known as: KLOR-CON     TAKE these medications   alum & mag hydroxide-simeth 200-200-20 MG/5ML suspension Commonly known as: MAALOX/MYLANTA Take 30 mLs by mouth every 6 (six) hours as needed for indigestion or heartburn.   amiodarone 200 MG tablet Commonly known as: PACERONE Take 1 tablet (200 mg total) by mouth daily.   ciprofloxacin 500 MG tablet Commonly known as: CIPRO Take 500 mg by mouth at bedtime.   DULoxetine 20 MG capsule Commonly known as: CYMBALTA Take 1 capsule (20 mg total) by mouth daily. Start taking on: April 27, 2020   ibuprofen 200 MG tablet Commonly known as: ADVIL Take 200 mg by mouth every 6 (six) hours as needed for fever, mild pain or headache.   lactulose 10 GM/15ML solution Commonly known as: CHRONULAC Take 45 mLs (30 g total) by mouth 2 (two) times daily. What changed:   how much to take  when to take this  reasons to take this   LORazepam 0.5 MG tablet Commonly known as: ATIVAN Take 1 tablet (0.5 mg total) by mouth every 4 (four) hours as needed for anxiety.   multivitamin with minerals Tabs  tablet Take 1 tablet by mouth daily.   ondansetron 4 MG disintegrating tablet Commonly known as: ZOFRAN-ODT Take 4 mg by mouth 3 (three) times daily as needed for nausea/vomiting.   oxyCODONE 5 MG immediate release tablet Commonly known as: Oxy IR/ROXICODONE Take 1 tablet (5 mg total) by mouth every 4 (four) hours as needed for severe pain.   pantoprazole 40 MG tablet Commonly known as: PROTONIX Take 1 tablet (40 mg total) by mouth daily at 6 (six) AM. Start taking on: April 27, 2020   polyvinyl alcohol 1.4 % ophthalmic solution Commonly known as: LIQUIFILM TEARS Place 1 drop into both eyes as needed for dry eyes.   sodium bicarbonate 650 MG tablet Take 1 tablet (650 mg total) by mouth 2 (two) times daily for 4 days.   spironolactone 100 MG tablet Commonly known as: ALDACTONE Take 100 mg by mouth daily.   zinc sulfate 220 (50 Zn) MG capsule Take 220 mg by mouth daily.            Discharge Care Instructions  (From admission, onward)  Start     Ordered   04/26/20 0000  Discharge wound care:       Comments: As above   04/26/20 1509         Allergies  Allergen Reactions  . Sulfa Antibiotics Nausea And Vomiting, Rash and Shortness Of Breath    Patient reports reaction of mild and took place a very long time ago.    Follow-up Information    [facility] Follow up.   Why: This is your home health agency for physical therapy and aide services Contact information: [address] [place], [place] [address] Phone: [phone]       Lawerance Cruel, MD. Schedule an appointment as soon as possible for a visit in 2 week(s).   Specialty: Family Medicine Contact information: [place] [address] [phone]        [name], MD. Schedule an appointment as soon as possible for a visit in 2 week(s).   Specialty: Cardiology Contact information: [place]  [address] [phone]        hospice Follow up.        [facility]. Schedule an appointment as soon as possible for a visit in 2 week(s).        Hunsucker, Bonna Gains, MD Follow up.   Specialty: Pulmonary Disease Why: Office will call with follow-up appointment time for pleural effusion. Contact information: [address] [place] [address] [phone]                The results of significant diagnostics from this hospitalization (including imaging, microbiology, ancillary and laboratory) are listed below for reference.    Significant Diagnostic Studies: MR BRAIN WO CONTRAST  Result Date: 04/25/2020 CLINICAL DATA:  Right arm weakness EXAM: MRI HEAD WITHOUT CONTRAST TECHNIQUE: Multiplanar, multiecho pulse sequences of the brain and surrounding structures were obtained without intravenous contrast. COMPARISON:  Head CT 04/25/2020 FINDINGS: Brain: No acute infarct, mass effect or extra-axial collection. Fewer than 5 scattered microhemorrhages in a nonspecific pattern. There is multifocal hyperintense T2-weighted signal within the white matter. Parenchymal volume and CSF spaces are normal. The midline structures are normal. Vascular: Major flow voids are preserved. Skull and upper cervical spine: Normal calvarium and skull base. Visualized upper cervical spine and soft tissues are normal. Sinuses/Orbits:Posterior right nasal cavity polyp. Advance mucosal thickening of the right maxillary sinus. Normal orbits. IMPRESSION: 1. No acute intracranial abnormality. 2. Multifocal hyperintense T2-weighted signal within the white matter, most commonly indicating chronic microvascular ischemia. 3. Posterior right nasal cavity polyp and mucosal thickening of the right maxillary sinus. Electronically Signed   By: Ulyses Jarred M.D.   On: 04/25/2020 23:35   CT ABDOMEN PELVIS W CONTRAST  Result Date: 04/21/2020 CLINICAL DATA:  Nausea, vomiting, abdominal distension  EXAM: CT ABDOMEN AND PELVIS WITH CONTRAST TECHNIQUE: Multidetector CT imaging of the abdomen and pelvis was performed using the standard protocol following bolus administration of intravenous contrast. CONTRAST:  160m OMNIPAQUE IOHEXOL 300 MG/ML  SOLN COMPARISON:  06/25/2018 FINDINGS: Lower chest: Small to moderate left pleural effusion has developed, incompletely evaluated, with compressive atelectasis of the left lower lobe. Mild atelectasis or infiltrate within the visualized right middle lobe. Extensive right coronary artery calcification. Global cardiac size within normal limits. Hepatobiliary: Progressive changes of cirrhosis with increasing nodularity of the liver surface, decreasing overall liver volume, enlargement of portosystemic vascular collaterals, and development of moderate ascites. Multiple hepatic  cysts are again identified, better assessed on sonogram of 02/21/2020. No enhancing liver mass identified. No intra or extrahepatic biliary ductal dilation. Cholelithiasis noted without definite CT evidence of acute cholecystitis. Pancreas: Unremarkable Spleen: Mild splenomegaly is again identified, stable since prior examination. No intrasplenic mass identified. The splenic vein is patent. Adrenals/Urinary Tract: The adrenal glands are unremarkable. The kidneys are normal in size and position. Multiple simple cortical cysts are seen bilaterally. 3 mm nonobstructing calculus is seen within the lower pole of the left kidney. No hydronephrosis. No ureteral calculi. The bladder is unremarkable. Stomach/Bowel: The stomach and small bowel are unremarkable. There is marked circumferential wall thickening involving the cecum and ascending colon which may reflect changes of portal colopathy, though a superimposed infectious or inflammatory ascending colitis could appear similarly. No evidence of obstruction. No free intraperitoneal gas. Moderate ascites. Vascular/Lymphatic: Progressive dilation of the a portal  vein likely reflective of increasing portal venous pressure. Prominent gastroesophageal varices are again identified. Recanalization of the umbilical vein again noted. Since the prior examination, embolization coils are identified within the inferior epigastric artery as well as high attenuation intraluminal material likely representing residual glue or Onyx. There is moderate aortoiliac atherosclerotic calcification. No aortic aneurysm. No pathologic adenopathy within the abdomen and pelvis. Reproductive: Multiple enhancing nodules within the uterus likely representing multiple underlying uterine fibroids. The pelvic organs are otherwise unremarkable. Other: There is enlargement of the right rectus sheath in keeping with the given history of a rectus hematoma. There is moderate diffuse body wall subcutaneous edema. Tiny fat containing umbilical hernia. Musculoskeletal: Subacute fractures of the right superior and inferior pubic rami and right sacral ala are identified. Remote healed fracture of the left inferior pubic ramus noted. Severe T11 compression fracture has developed, age indeterminate, with near vertebral plana deformity and mild retropulsion of the superior posterior aspect of the T11 vertebral body resulting in moderate central canal stenosis. IMPRESSION: Enlargement of the a right rectus sheath in keeping with a rectus hematoma. Embolization of the right inferior epigastric artery. No evidence of active extravasation. Progressive changes of cirrhosis and portal venous hypertension with enlarging portal venous and portosystemic collateral vasculature, progressive volume loss of the liver, and development of moderate ascites. Interval development of marked circumferential wall thickening of the cecum and ascending colon suggestive of a portal colopathy, though infectious or inflammatory ascending colitis could appear similarly. Interval development of moderate anasarca with left pleural effusion, diffuse  body wall subcutaneous edema and ascites. Severe wedge compression deformity of T11 with mild retropulsion, age indeterminate. This was not seen on prior CT examination of the chest of 10/17/2019. Correlation for point tenderness is recommended. This could be better assessed with MRI examination, if indicated. Additional subacute fractures of the right superior and inferior pubic rami as well as the right sacral ala. Cholelithiasis. Minimal left nephrolithiasis. Aortic Atherosclerosis (ICD10-I70.0). Electronically Signed   By: Fidela Salisbury MD   On: 04/21/2020 03:33   DG CHEST PORT 1 VIEW  Result Date: 04/24/2020 CLINICAL DATA:  Pleural effusion. EXAM: PORTABLE CHEST 1 VIEW COMPARISON:  February 20, 2020. FINDINGS: Stable cardiomediastinal silhouette. No pneumothorax is noted. Right lung is clear. Diffuse opacity of left hemithorax is noted consistent with layering effusion. Left basilar atelectasis is present as well. Bony thorax is unremarkable. IMPRESSION: Large layering left pleural effusion is noted with associated left basilar atelectasis. Electronically Signed   By: Marijo Conception M.D.   On: 04/24/2020 16:22   ECHOCARDIOGRAM COMPLETE  Result Date: 04/24/2020  ECHOCARDIOGRAM REPORT   Patient Name:   Dana Watkins [facility] Date of Exam: 04/24/2020 Medical Rec #:  [id number]        Height:       68.0 in Accession #:    [id number]       Weight:       170.6 lb Date of Birth:  01/09/1956       BSA:          1.910 m Patient Age:    30 years         BP:           97/66 mmHg Patient Gender: F                HR:           111 bpm. Exam Location:  Inpatient Procedure: 2D Echo, Cardiac Doppler and Color Doppler Indications:    Atrial fibrillation  History:        Patient has prior history of Echocardiogram examinations, most                 recent 08/07/2019. CHF, CAD; Arrythmias:Atrial Fibrillation.                 Alcoholic cirrhosis, anemia.  Sonographer:    Dustin Flock Referring Phys: [id number] Doll Frazee V Epic [place]   Sonographer Comments: Image acquisition challenging due to respiratory motion. IMPRESSIONS  1. Left ventricular ejection fraction, by estimation, is 50 to 55%. The left ventricle has low normal function. The left ventricle demonstrates regional wall motion abnormalities, possible lateral hypokinesis. Left ventricular diastolic parameters are indeterminate.  2. Right ventricular systolic function is normal. The right ventricular size is normal. There is normal pulmonary artery systolic pressure. The estimated right ventricular systolic pressure is 28.7 mmHg.  3. Left atrial size was moderately dilated.  4. The mitral valve is normal in structure. No evidence of mitral valve regurgitation. No evidence of mitral stenosis.  5. The aortic valve is tricuspid. Aortic valve regurgitation is not visualized. No aortic stenosis is present.  6. The inferior vena cava is dilated in size with >50% respiratory variability, suggesting right atrial pressure of 8 mmHg.  7. There is a substantial left pleural effusion.  8. The patient was in atrial fibrillation. FINDINGS  Left Ventricle: Left ventricular ejection fraction, by estimation, is 50 to 55%. The left ventricle has low normal function. The left ventricle demonstrates regional wall motion abnormalities. The left ventricular internal cavity size was normal in size. There is no left ventricular hypertrophy. Left ventricular diastolic parameters are indeterminate. Right Ventricle: The right ventricular size is normal. No increase in right ventricular wall thickness. Right ventricular systolic function is normal. There is normal pulmonary artery systolic pressure. The tricuspid regurgitant velocity is 2.59 m/s, and  with an assumed right atrial pressure of 8 mmHg, the estimated right ventricular systolic pressure is 86.7 mmHg. Left Atrium: Left atrial size was moderately dilated. Right Atrium: Right atrial size was normal in size. Pericardium: There is a substantial left pleural  effusion. There is no evidence of pericardial effusion. Mitral Valve: The mitral valve is normal in structure. No evidence of mitral valve regurgitation. No evidence of mitral valve stenosis. Tricuspid Valve: The tricuspid valve is normal in structure. Tricuspid valve regurgitation is trivial. Aortic Valve: The aortic valve is tricuspid. Aortic valve regurgitation is not visualized. No aortic stenosis is present. Pulmonic Valve: The pulmonic valve was not well visualized. Pulmonic valve regurgitation is not visualized. Aorta: The aortic  root is normal in size and structure. Venous: The inferior vena cava is dilated in size with greater than 50% respiratory variability, suggesting right atrial pressure of 8 mmHg. IAS/Shunts: No atrial level shunt detected by color flow Doppler.  LEFT VENTRICLE PLAX 2D LVIDd:         4.70 cm  Diastology LVIDs:         2.90 cm  LV e' medial:    5.77 cm/s LV PW:         1.00 cm  LV E/e' medial:  17.5 LV IVS:        1.00 cm  LV e' lateral:   12.90 cm/s LVOT diam:     2.40 cm  LV E/e' lateral: 7.8 LV SV:         95 LV SV Index:   50 LVOT Area:     4.52 cm  RIGHT VENTRICLE RV Basal diam:  2.60 cm RV S prime:     8.16 cm/s TAPSE (M-mode): 1.8 cm LEFT ATRIUM             Index       RIGHT ATRIUM           Index LA diam:        5.10 cm 2.67 cm/m  RA Area:     17.20 cm LA Vol (A2C):   89.8 ml 47.01 ml/m RA Volume:   43.10 ml  22.56 ml/m LA Vol (A4C):   85.6 ml 44.81 ml/m LA Biplane Vol: 88.1 ml 46.12 ml/m  AORTIC VALVE LVOT Vmax:   120.00 cm/s LVOT Vmean:  89.900 cm/s LVOT VTI:    0.211 m  AORTA Ao Root diam: 2.90 cm MITRAL VALVE                TRICUSPID VALVE MV Area (PHT): 6.54 cm     TR Peak grad:   26.8 mmHg MV Decel Time: 116 msec     TR Vmax:        259.00 cm/s MV E velocity: 101.00 cm/s MV A velocity: 63.80 cm/s   SHUNTS MV E/A ratio:  1.58         Systemic VTI:  0.21 m                             Systemic Diam: 2.40 cm Loralie Champagne MD Electronically signed by Loralie Champagne MD  Signature Date/Time: 04/24/2020/6:04:39 PM    Final    CT HEAD CODE STROKE WO CONTRAST  Result Date: 04/25/2020 CLINICAL DATA:  Code stroke.  Right arm weakness. EXAM: CT HEAD WITHOUT CONTRAST TECHNIQUE: Contiguous axial images were obtained from the base of the skull through the vertex without intravenous contrast. COMPARISON:  12/09/2018 FINDINGS: Brain: There is no evidence of an acute infarct, intracranial hemorrhage, mass, midline shift, or extra-axial fluid collection. Mild cerebral atrophy has slightly progressed. Hypodensities in the cerebral white matter bilaterally are similar to the prior study and are nonspecific but compatible with mild chronic small vessel ischemic disease. Vascular: Calcified atherosclerosis at the skull base. No hyperdense vessel. Skull: No fracture or suspicious osseous lesion. Sinuses/Orbits: Chronic right maxillary sinusitis with persistent near complete opacification of the sinus. New low-density soft tissue extending from the region of the right maxillary sinus ostium into the posterior nasal cavity, incompletely imaged. Clear mastoid air cells. Unremarkable orbits. Other: None. ASPECTS [facility] Stroke Program Early CT Score) - Ganglionic level infarction (caudate, lentiform nuclei, internal  capsule, insula, M1-M3 cortex): 7 - Supraganglionic infarction (M4-M6 cortex): 3 Total score (0-10 with 10 being normal): 10 IMPRESSION: 1. No evidence of acute intracranial abnormality. 2. ASPECTS is 10. 3. Mild chronic small vessel ischemic disease and cerebral atrophy. 4. Chronic right maxillary sinusitis with new soft tissue extending from the region of the right maxillary sinus ostium into the nasal cavity potentially reflecting a polyp. These results were communicated to Dr. Irine Seal at 12:20 pm on 04/25/2020 by text page via the [facility] messaging system. Electronically Signed   By: Logan Bores M.D.   On: 04/25/2020 12:20    Microbiology: Recent Results (from the past 240  hour(s))  SARS CORONAVIRUS 2 (TAT 6-24 HRS) Nasopharyngeal Nasopharyngeal Swab     Status: None   Collection Time: 04/21/20  5:28 AM   Specimen: Nasopharyngeal Swab  Result Value Ref Range Status   SARS Coronavirus 2 NEGATIVE NEGATIVE Final    Comment: (NOTE) SARS-CoV-2 target nucleic acids are NOT DETECTED.  The SARS-CoV-2 RNA is generally detectable in upper and lower respiratory specimens during the acute phase of infection. Negative results do not preclude SARS-CoV-2 infection, do not rule out co-infections with other pathogens, and should not be used as the sole basis for treatment or other patient management decisions. Negative results must be combined with clinical observations, patient history, and epidemiological information. The expected result is Negative.  Fact Sheet for Patients: SugarRoll.be  Fact Sheet for Healthcare Providers: https://www.woods-mathews.com/  This test is not yet approved or cleared by the [place] FDA and  has been authorized for detection and/or diagnosis of SARS-CoV-2 by FDA under an Emergency Use Authorization (EUA). This EUA will remain  in effect (meaning this test can be used) for the duration of the COVID-19 declaration under Se ction 564(b)(1) of the Act, 21 U.S.C. section 360bbb-3(b)(1), unless the authorization is terminated or revoked sooner.  Performed at [facility], [place] [address], [place], [place] [address]   MRSA PCR Screening     Status: Abnormal   Collection Time: 04/21/20  6:25 AM   Specimen: Nasal Mucosa; Nasopharyngeal  Result Value Ref Range Status   MRSA by PCR POSITIVE (A) NEGATIVE Final    Comment:        The GeneXpert MRSA Assay (FDA approved for NASAL specimens only), is one component of a comprehensive MRSA colonization surveillance program. It is not intended to diagnose MRSA infection nor to guide or monitor treatment for MRSA infections. RESULT CALLED  TO, READ BACK BY AND VERIFIED WITH: CAUDLE,L. RN AT 1127 04/21/20 MULLINS,T Performed at [facility], [place] [address] [place], [place] [address]      Labs: Basic Metabolic Panel: Recent Labs  Lab 04/20/20 2351 04/21/20 0948 04/22/20 0217 04/23/20 0535 04/24/20 0507 04/25/20 0234 04/26/20 0235  NA 133* 133* 138 134* 135 135 134*  K 2.8* 3.9 3.7 3.9 3.4* 3.6 3.9  CL 111 110 113* 112* 111 112* 111  CO2 17* 17* 16* 18* 19* 18* 16*  GLUCOSE 87 89 105* 106* 106* 113* 115*  BUN 14 16 12 12 12 13 13   CREATININE 1.18* 0.81 1.11* 1.04* 1.17* 1.10* 1.10*  CALCIUM 8.5* 9.3 9.6 9.3 9.2 9.1 9.0  MG 1.6* 2.2  --   --  2.0 1.9 2.3   Liver Function Tests: Recent Labs  Lab 04/22/20 0217 04/23/20 0535 04/24/20 0507 04/25/20 0234 04/26/20 0235  AST 73* 73* 65* 60* 58*  ALT 40 42 35 34 33  ALKPHOS 197* 213* 186* 195* 195*  BILITOT 13.6* 12.1* 12.7* 10.5* 9.9*  PROT 5.0* 4.9* 4.9* 4.8* 4.8*  ALBUMIN 2.2* 2.0* 2.3* 2.0* 2.0*   Recent Labs  Lab 04/20/20 2351  LIPASE 55*   No results for input(s): AMMONIA in the last 168 hours. CBC: Recent Labs  Lab 04/20/20 2351 04/21/20 0948 04/21/20 1619 04/21/20 2157 04/22/20 0217 04/23/20 0535 04/24/20 0507 04/25/20 0234 04/26/20 0235  WBC 13.0*   < > 9.3   < > 9.3 9.0 8.5 9.5 9.5  NEUTROABS 10.3*  --  6.6  --   --   --  5.4 6.1  --   HGB 6.6*   < > 7.9*   < > 7.8* 8.0* 8.2* 9.0* 8.2*  HCT 20.0*   < > 24.3*   < > 24.4* 25.1* 25.1* 28.4* 25.9*  MCV 108.1*   < > 105.7*   < > 107.0* 110.6* 108.7* 111.8* 114.6*  PLT 47*   < > 77*   < > 71* 70* 69* 74* 66*   < > = values in this interval not displayed.   Cardiac Enzymes: No results for input(s): CKTOTAL, CKMB, CKMBINDEX, TROPONINI in the last 168 hours. BNP: BNP (last 3 results) Recent Labs    07/31/19 1415 08/07/19 0030  BNP 442.8* 153.2*    ProBNP (last 3 results) Recent Labs    08/23/19 1030  PROBNP 190.0*    CBG: No results for input(s): GLUCAP in  the last 168 hours.     Signed:  Irine Seal MD.  [facility] 04/26/2020, 3:35 PM

## 2020-04-28 ENCOUNTER — Telehealth: Payer: Self-pay | Admitting: *Deleted

## 2020-04-28 DIAGNOSIS — D684 Acquired coagulation factor deficiency: Secondary | ICD-10-CM | POA: Diagnosis not present

## 2020-04-28 DIAGNOSIS — R17 Unspecified jaundice: Secondary | ICD-10-CM | POA: Diagnosis not present

## 2020-04-28 DIAGNOSIS — K7210 Chronic hepatic failure without coma: Secondary | ICD-10-CM | POA: Diagnosis not present

## 2020-04-28 DIAGNOSIS — E876 Hypokalemia: Secondary | ICD-10-CM | POA: Diagnosis not present

## 2020-04-28 DIAGNOSIS — S51811D Laceration without foreign body of right forearm, subsequent encounter: Secondary | ICD-10-CM | POA: Diagnosis not present

## 2020-04-28 DIAGNOSIS — D696 Thrombocytopenia, unspecified: Secondary | ICD-10-CM | POA: Diagnosis not present

## 2020-04-28 DIAGNOSIS — N183 Chronic kidney disease, stage 3 unspecified: Secondary | ICD-10-CM | POA: Diagnosis not present

## 2020-04-28 DIAGNOSIS — K766 Portal hypertension: Secondary | ICD-10-CM | POA: Diagnosis not present

## 2020-04-28 DIAGNOSIS — R69 Illness, unspecified: Secondary | ICD-10-CM | POA: Diagnosis not present

## 2020-04-28 DIAGNOSIS — S301XXD Contusion of abdominal wall, subsequent encounter: Secondary | ICD-10-CM | POA: Diagnosis not present

## 2020-04-28 LAB — HEMOGLOBIN A1C
Hgb A1c MFr Bld: 4.3 % — ABNORMAL LOW (ref 4.8–5.6)
Mean Plasma Glucose: 77 mg/dL

## 2020-04-28 NOTE — Telephone Encounter (Signed)
Tried calling the pt and there was no answer and her VM was full. Will route to the front desk pool to make another attempt per protocol.

## 2020-04-28 NOTE — Telephone Encounter (Signed)
-----   Message from Lanier Clam, MD sent at 04/24/2020  6:45 PM EDT ----- Regarding: clinic f/u Can this lady see beth or Dr Lamonte Sakai in 4-6 weeks, hospital f/u, L hepatic hydrothorax (history of L hemothorax)  Did not tap as not symptomatic, transudate in 08/2019.

## 2020-04-30 ENCOUNTER — Telehealth: Payer: Self-pay | Admitting: Cardiology

## 2020-04-30 NOTE — Telephone Encounter (Signed)
[name] (with [facility]) who is caring for the pt, is calling to give Dr. Meda Coffee and Dr. Johney Frame an update on this pt, and to ask about pts amiodarone therapy.   Pt is a former Dr. Meda Coffee pt, and is assigned to establish care with Dr. Johney Frame, although Dr. Johney Frame hasn't met the pt yet.  Per Cristela Blue RN, the pt was recently admitted to [facility] hospital with the following diagnosis:   Acute blood loss anemia secondary to rectus sheath hematoma in the setting of coagulopathy and thrombocytopenia, hypokalemia, hepatic encephalopathy, decompensated alcoholic cirrhosis with ascites, CKD stage 3, heart failure, afib RVR, left sided hepatic hydrothorax/left pleural effusion, and several other diagnosis.    Pt was discharged on 04/26/20 to the home with hospice following.  Cristela Blue is currently caring for the pt with home hospice.  Cristela Blue states the discharge Provider advised for the pt to have several follow-up visits with different specialty Providers, but pt is currently on comfort care measure, and the family has decided to keep her in the home, and not commute her to different Doctors appointments due to declining condition.  Per Cristela Blue RN, the family asked her to reach out to our office and ask Dr. Meda Coffee, now Dr. Johney Frame, if it is necessary for the pt to continue taking amiodarone 200 mg po daily.  They are aware that Cardiology placed her on this for short duration, while she was admitted to the hospital, for pt had afib RVR.  [name] states that the family is trying to keep the pt on comfort care measures, and wants advisement if this medication is needed for this, or can it be reduced and/or discontinued.  Per Cristela Blue RN they will keep giving the pt her spironolactone as prescribed, but would like clarification on amiodarone use. [name] states the pt will not be able to follow-up with our office, due to poor prognosis and being under comfort care through hospice.  [name] states she asked her  Hospice Provider about if pt should continue taking amiodarone or discontinue this while under comfort care, but he referred Cristela Blue to call our office for advisement on this matter.  [name] states the pt is under comfort care measures and receiving morphine and ativan per protocol. [name] states yesterday when she went into the home to see the pt, her HR was 80 at rest and pt was only able to exert about 15 ft, and her HR went up to 102 bpm.  She states they had to obtain supplemental oxygen for the pt yesterday, due to declining oxygen levels.  Pt now has this as a comfort care measure.  [name] states she is aware that Dr. Johney Frame has never met the pt, but is asking if she would advise on pts amiodarone management, and if it's necessary to continue this regimen,  being the pt is under comfort care at this time.  Informed [name] that I will route this message to Dr. Johney Frame to further review and advise, and follow-up accordingly thereafter.  Informed Cristela Blue that she may not receive an answer from our office until tomorrow.  [name] verbalized understanding and agrees with this plan.

## 2020-04-30 NOTE — Telephone Encounter (Signed)
     [name] with [facility], she would like to speak with Dr. Francesca Oman nurse. She needs to give update about pt, she was recently discharge from [facility] hospital for liver failure, complication and abnormal cardiac rhythm. She also need to discuss about pt's medications

## 2020-05-01 NOTE — Telephone Encounter (Signed)
Amiodarone is by no means a necessary medication for her, however, it may prevent her from going back into Afib with RVR. If she is symptomatic with her atrial fibrillation, I would keep the amiodarone on for comfort to make it less likely that she flips into this rhythm. If she is asymptomatic when in Afib, she can stop it.

## 2020-05-01 NOTE — Telephone Encounter (Signed)
Attempted to call pt to schedule hospital f/u - vm full -pr

## 2020-05-01 NOTE — Telephone Encounter (Signed)
Spoke with Materials engineer and the pts daugher Dana Watkins on the phone and endorsed to them Dr. Jacolyn Reedy recommendations, in regards to the pts amiodarone use.  Both parties state the pt was asymptomatic when she experienced afib in the hospital, and are considering stopping this medication for the pt, being she is on comfort care measures at this time.  [name] states she will discuss these recommendations more with the family and they will make a decision soon if the pt should continue this regimen or not.  [name] verbalized understanding and agrees with this plan. [name] and [name] were both super grateful for all the assistance provided, and felt this information was very helpful.

## 2020-05-02 NOTE — Telephone Encounter (Signed)
Tried to reach pt but still unable to reach and VM box still full.  Will leave encounter open to see if we are able to reach pt. Encounter has already been routed to front desk pool.

## 2020-05-05 ENCOUNTER — Inpatient Hospital Stay: Payer: Medicare HMO | Attending: Hematology and Oncology | Admitting: Hematology and Oncology

## 2020-05-05 ENCOUNTER — Inpatient Hospital Stay: Payer: Medicare HMO

## 2020-05-05 NOTE — Progress Notes (Incomplete)
Patient Care Team: Lawerance Cruel, MD as PCP - General (Family Medicine) [name], MD as PCP - Cardiology (Cardiology) Stephens Shire, MD as Referring Physician (Family Medicine)  DIAGNOSIS:    ICD-10-CM   1. Anemia due to stage 3 chronic kidney disease, unspecified whether stage 3a or 3b CKD (HCC)  N18.30    D63.1     CHIEF COMPLIANT: Follow-up ofchronic anemia and thrombocytopenia, recent hospitalizations   INTERVAL HISTORY: Dana Watkins is a 65 y.o. with above-mentioned history of chronic anemiasecondary to stage 3 chronic kidney diseaseand thrombocytopenia. She was admitted from 02/20/20-02/28/20 following a fall and found to have a pubic rami fracture and decompensated liver failure in addition to symptomatic anemia. She was admitted to [facility] from 04/15/20-04/18/20 for abdominal pain and sever hypokalemia and found to have a rectus sheath hematoma. She was given 2 units of PRBCs, and an embolization was performed. She was again admitted from 04/20/20-04/26/20 for continued abdominal pain and nausea and found to have worsening anemia, thrombocytopenia, and coagulopathy. She received one unit of PRBCs and one unit of platelets. She presents to the clinic todayfor follow-up.   ALLERGIES:  is allergic to sulfa antibiotics.  MEDICATIONS:  Current Outpatient Medications  Medication Sig Dispense Refill  . alum & mag hydroxide-simeth (MAALOX/MYLANTA) 200-200-20 MG/5ML suspension Take 30 mLs by mouth every 6 (six) hours as needed for indigestion or heartburn. 710 mL 0  . amiodarone (PACERONE) 200 MG tablet Take 1 tablet (200 mg total) by mouth daily. 30 tablet 1  . ciprofloxacin (CIPRO) 500 MG tablet Take 500 mg by mouth at bedtime.    . DULoxetine (CYMBALTA) 20 MG capsule Take 1 capsule (20 mg total) by mouth daily. 30 capsule 1  . ibuprofen (ADVIL) 200 MG tablet Take 200 mg by mouth every 6 (six) hours as needed for fever, mild pain or headache.    .  lactulose (CHRONULAC) 10 GM/15ML solution Take 45 mLs (30 g total) by mouth 2 (two) times daily. 1892 mL 1  . LORazepam (ATIVAN) 0.5 MG tablet Take 1 tablet (0.5 mg total) by mouth every 4 (four) hours as needed for anxiety. 12 tablet 0  . Multiple Vitamin (MULTIVITAMIN WITH MINERALS) TABS tablet Take 1 tablet by mouth daily. (Patient not taking: Reported on 04/21/2020) 30 tablet 0  . ondansetron (ZOFRAN-ODT) 4 MG disintegrating tablet Take 4 mg by mouth 3 (three) times daily as needed for nausea/vomiting.    Marland Kitchen oxyCODONE (OXY IR/ROXICODONE) 5 MG immediate release tablet Take 1 tablet (5 mg total) by mouth every 4 (four) hours as needed for severe pain. 12 tablet 0  . pantoprazole (PROTONIX) 40 MG tablet Take 1 tablet (40 mg total) by mouth daily at 6 (six) AM. 30 tablet 1  . polyvinyl alcohol (LIQUIFILM TEARS) 1.4 % ophthalmic solution Place 1 drop into both eyes as needed for dry eyes.    Marland Kitchen spironolactone (ALDACTONE) 100 MG tablet Take 100 mg by mouth daily.    Marland Kitchen zinc sulfate 220 (50 Zn) MG capsule Take 220 mg by mouth daily.     No current facility-administered medications for this visit.    PHYSICAL EXAMINATION: ECOG PERFORMANCE STATUS: {CHL ONC ECOG PS:[phone]}  There were no vitals filed for this visit. There were no vitals filed for this visit.  LABORATORY DATA:  I have reviewed the data as listed CMP Latest Ref Rng & Units 04/26/2020 04/25/2020 04/24/2020  Glucose 70 - 99 mg/dL 115(H) 113(H) 106(H)  BUN  8 - 23 mg/dL 13 13 12   Creatinine 0.44 - 1.00 mg/dL 1.10(H) 1.10(H) 1.17(H)  Sodium 135 - 145 mmol/L 134(L) 135 135  Potassium 3.5 - 5.1 mmol/L 3.9 3.6 3.4(L)  Chloride 98 - 111 mmol/L 111 112(H) 111  CO2 22 - 32 mmol/L 16(L) 18(L) 19(L)  Calcium 8.9 - 10.3 mg/dL 9.0 9.1 9.2  Total Protein 6.5 - 8.1 g/dL 4.8(L) 4.8(L) 4.9(L)  Total Bilirubin 0.3 - 1.2 mg/dL 9.9(H) 10.5(H) 12.7(H)  Alkaline Phos 38 - 126 U/L 195(H) 195(H) 186(H)  AST 15 - 41 U/L 58(H) 60(H) 65(H)  ALT 0 - 44 U/L  33 34 35    Lab Results  Component Value Date   WBC 9.5 04/26/2020   HGB 8.2 (L) 04/26/2020   HCT 25.9 (L) 04/26/2020   MCV 114.6 (H) 04/26/2020   PLT 66 (L) 04/26/2020   NEUTROABS 6.1 04/25/2020    ASSESSMENT & PLAN:  No problem-specific Assessment & Plan notes found for this encounter.    No orders of the defined types were placed in this encounter.  The patient has a good understanding of the overall plan. she agrees with it. she will call with any problems that may develop before the next visit here.  Total time spent: *** mins including face to face time and time spent for planning, charting and coordination of care  Rulon Eisenmenger, MD, MPH 05/05/2020  I, Molly Dorshimer, am acting as scribe for Dr. Nicholas Lose.  {insert scribe attestation}

## 2020-05-05 NOTE — Assessment & Plan Note (Deleted)
Chronic normocytic anemia: GFR 36 Secondary to chronic kidney disease as well as secondary to cirrhosis of the liver.  Lab review  04/16/2019: Hemoglobin 8.1, MCV 96.3 platelets 57, erythropoietin 43.1, B12 1087, haptoglobin less than 10 (due to cirrhosis), total bilirubin 6.1, creatinine 1.3, LDH and folic acid normal 0/17/7939:[id number] 8.7, platelets 58 10/23/2019:Hemoglobin 9.2 02/03/2018: Hemoglobin 8.5, platelets 79  Attempted to Aranesp injections but patient got severe pain and discomfort and therefore we discontinued it. Lab review:Today's hemoglobin is 8.5  Liver cirrhosis:Child's class is 3 which translates into a medium life expectancy of 1 to 3 years.  Hospitalization: 08/06/2019-08/14/2019: Acute NSTEMI (no interventions), CHF, respiratory failure, CKD, cirrhosis, pancytopenia, right lung collapse, pleural effusion: Chest tube, Received 2 units of PRBC x2 and FFP 04/20/2020-04/26/2020: Severe anemia hemoglobin 6.6, worsening coagulopathy, transfusion: 1 unit PRBC, 1 unit platelets, 2 units cryo, rectus sheath hematoma despite embolization at [facility]  Recommendation: I agree with hospice

## 2020-05-05 NOTE — Telephone Encounter (Signed)
Called and spoke with patient, daughter was on the call as well, scheduled for consult with Dr. Lamonte Sakai per Dr. Silas Flood.  Advised by patient's daughter that patient is now on Hospice care.  Advised that if they decided not to have the consult to just call the office and cancel.  They verbalized understanding.  Nothing further needed.  Dr. Silas Flood and Dr. Lamonte Sakai, this is just an FYI:  Patient is now on Hospice care.  Thank you.

## 2020-05-05 NOTE — Telephone Encounter (Signed)
OK thank you 

## 2020-05-06 ENCOUNTER — Telehealth: Payer: Self-pay | Admitting: Cardiology

## 2020-05-06 NOTE — Telephone Encounter (Signed)
[place] is calling to provide information to [facility] in regards to this PT.Please advise

## 2020-05-06 NOTE — Telephone Encounter (Signed)
[name] with hospice and caring for the pt, was calling us to give update.  [name] states the pt is declining more. She states she is blowing up with fluid, her albumin is 2, she has 4+ pitting edema, has crackles to her lungs, decreased activity level, and is third spacing.  They are providing the pt comfort care measures.  [name] states she does have an attending MD she seeks orders from, as well as from the Hospice MD. Cristela Blue RN wanted to keep [place] posted on the pt, for she is steadily declining.  [name] states they are still giving the pt her spironolactone regimen. Informed [name] that I would pass this message along to Dr. Johney Frame for her review upon return to the office.  Asked [name] to give the pt and her family our best wishes.  [name] verbalized understanding and agrees with this plan.

## 2020-05-28 ENCOUNTER — Inpatient Hospital Stay: Payer: Medicare HMO | Admitting: Emergency Medicine

## 2020-06-02 IMAGING — DX DG CHEST 1V PORT
1 series · 1 of 1 positions shown · non-contrast
Comparison: December 09, 2018

CLINICAL DATA: Cough with nausea and vomiting

EXAM:
PORTABLE CHEST 1 VIEW

[chest ap]
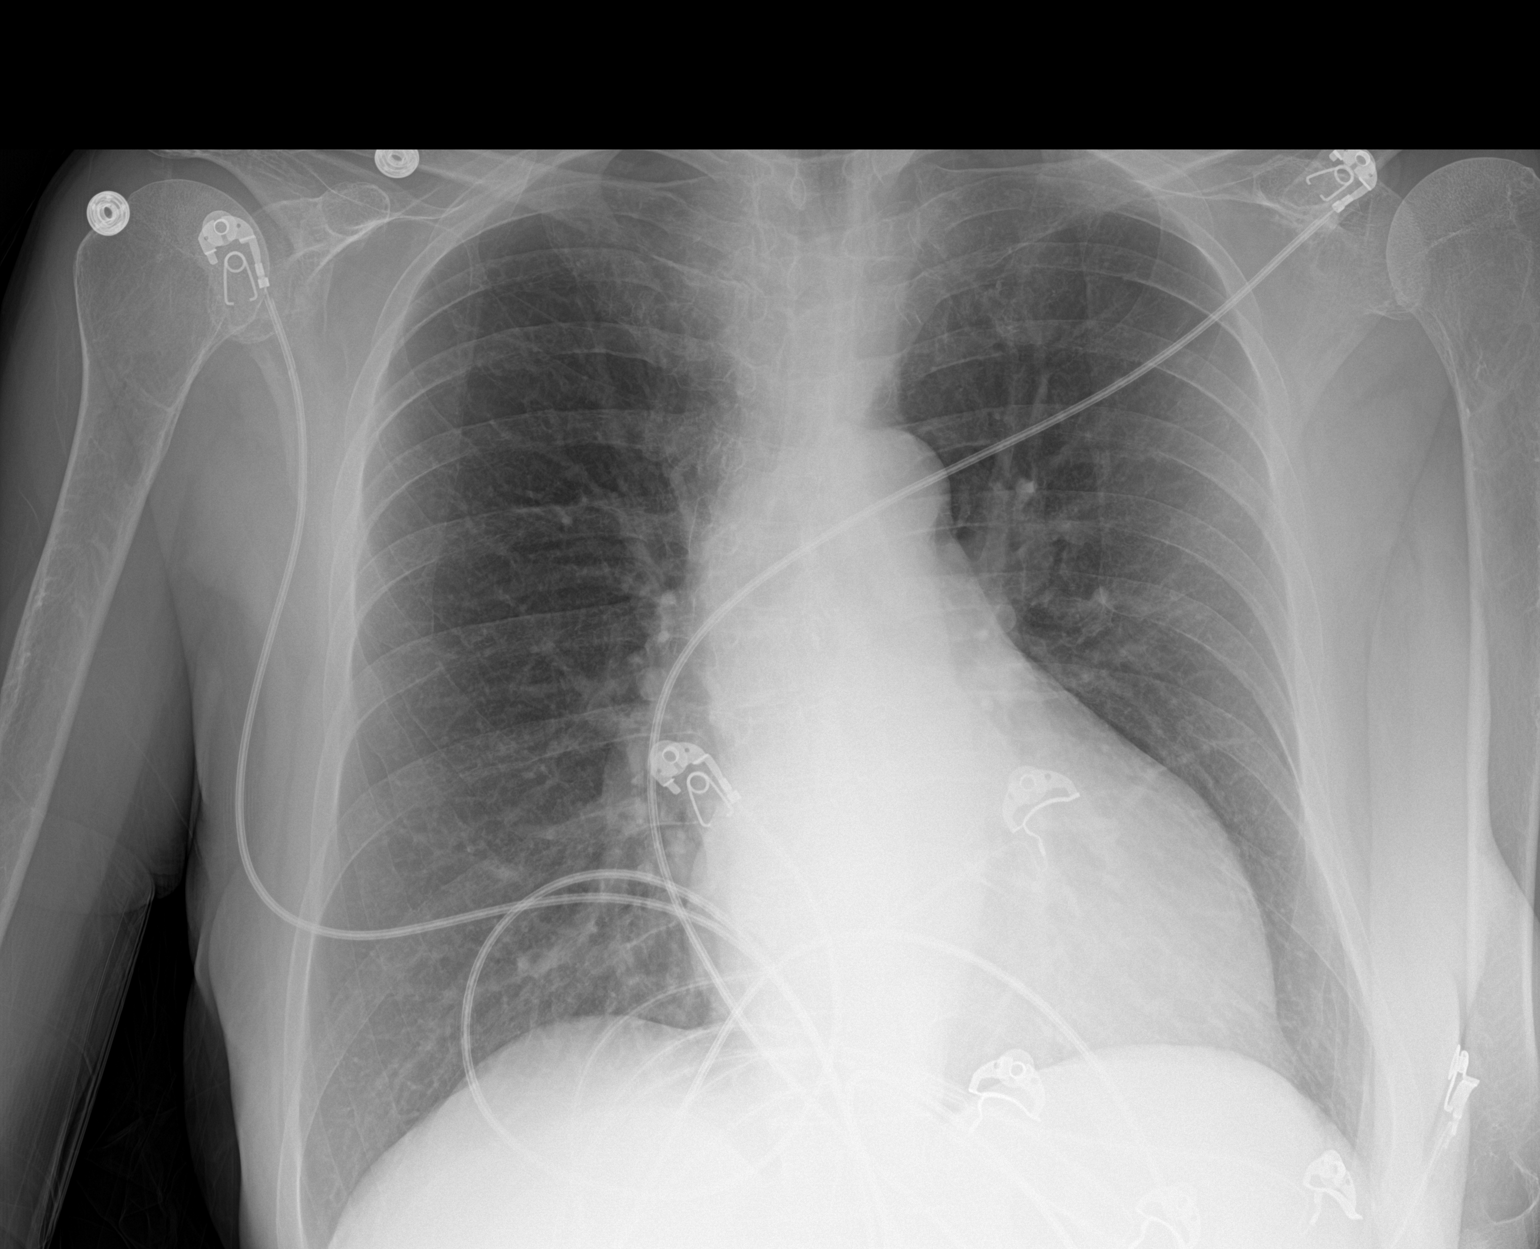

[1 of 1 positions shown; findings below may reference images not displayed]

FINDINGS: Lungs are clear. Heart is upper normal in size with pulmonary
vascularity normal. No adenopathy. No bone lesions.
IMPRESSION: Lungs clear.  Heart upper normal in size.  No adenopathy.

## 2020-06-11 DEATH — deceased

## 2020-11-18 IMAGING — CT CT CHEST W/O CM
2 of 3 series · 15 of 36 positions shown, 18 images · non-contrast
Comparison: 08/06/2019 CT, plain film from earlier in the same day.

CLINICAL DATA: Follow-up hemothorax

EXAM:
CT CHEST WITHOUT CONTRAST
TECHNIQUE: Multidetector CT imaging of the chest was performed following the
standard protocol without IV contrast.

[Series 2: thorax · axial · 0.74mm/px · z∈[+1347,+1577]mm · 12 of 135 slices shown, 15 images]
[im 10/135  mediastinal]
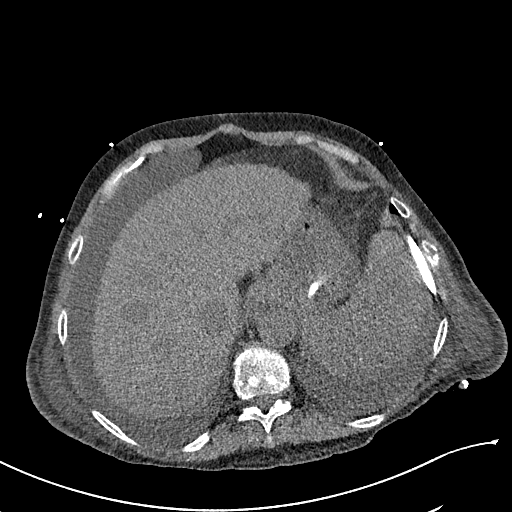
[im 10/135  lung]
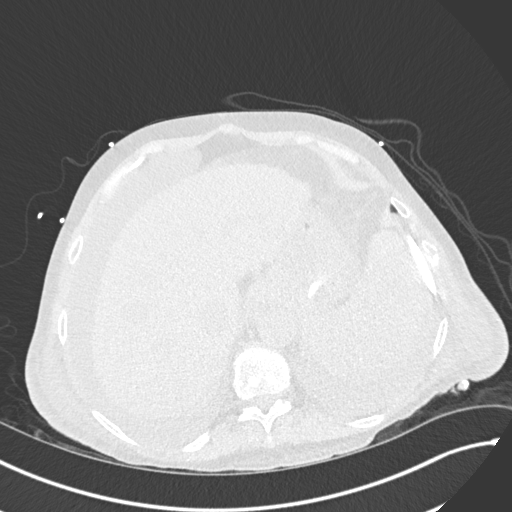
[im 20/135  lung]
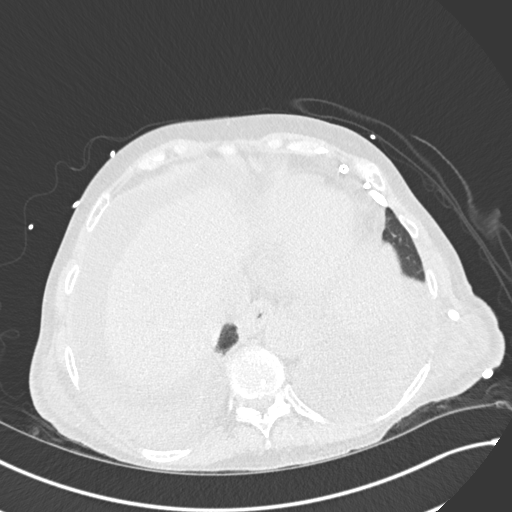
[im 30/135  lung]
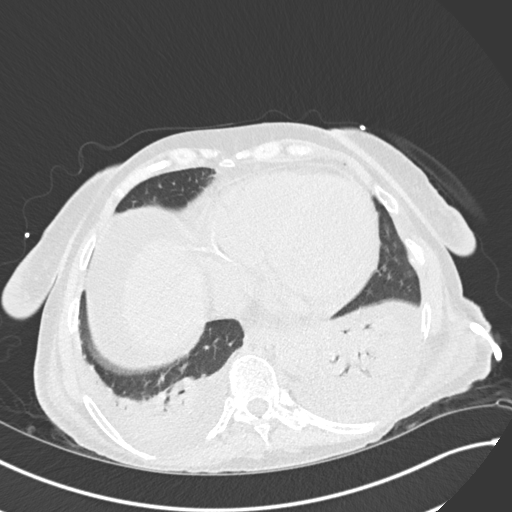
[im 40/135  lung]
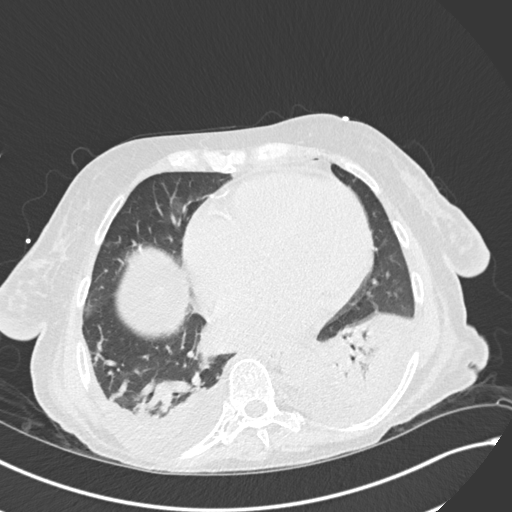
[im 50/135  mediastinal]
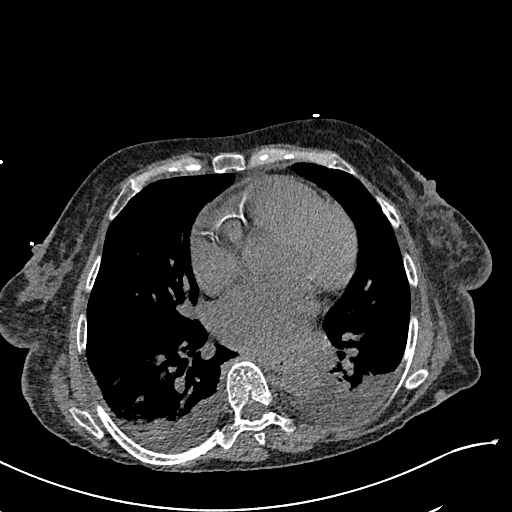
[im 50/135  lung]
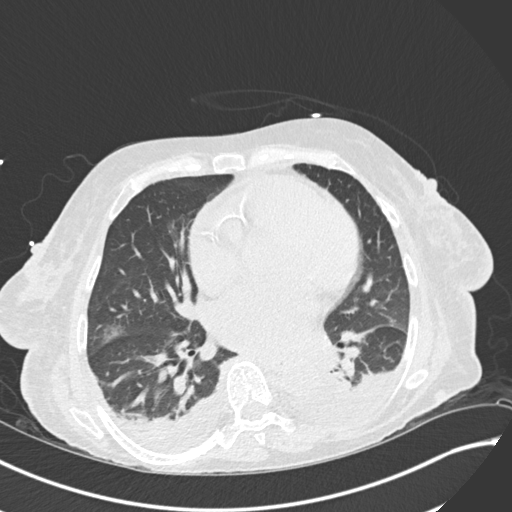
[im 60/135  lung]
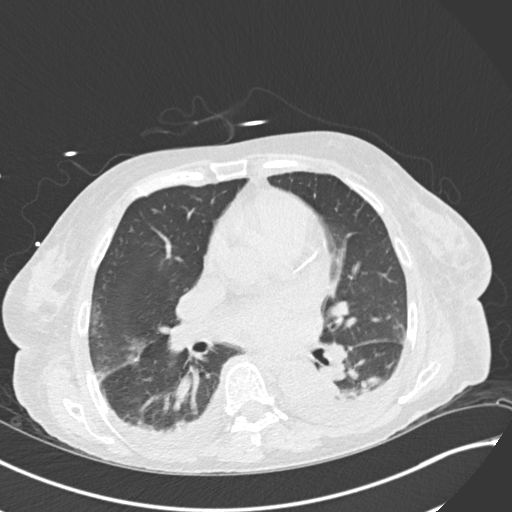
[im 75/135  lung]
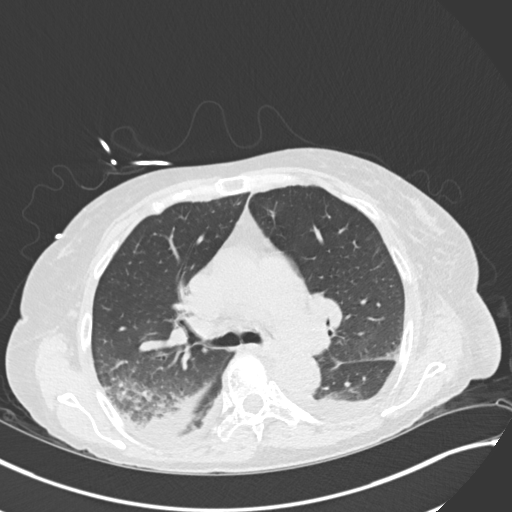
[im 85/135  lung]
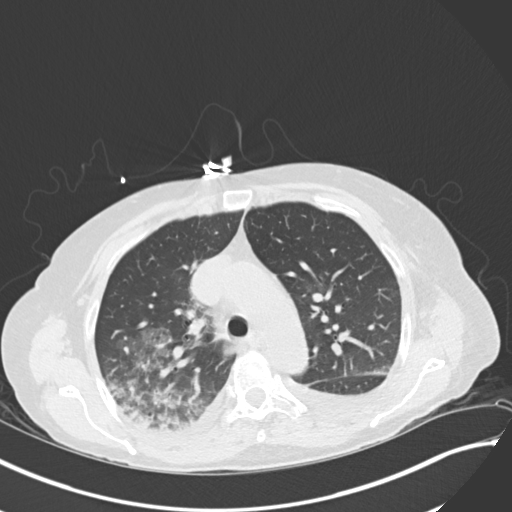
[im 95/135  mediastinal]
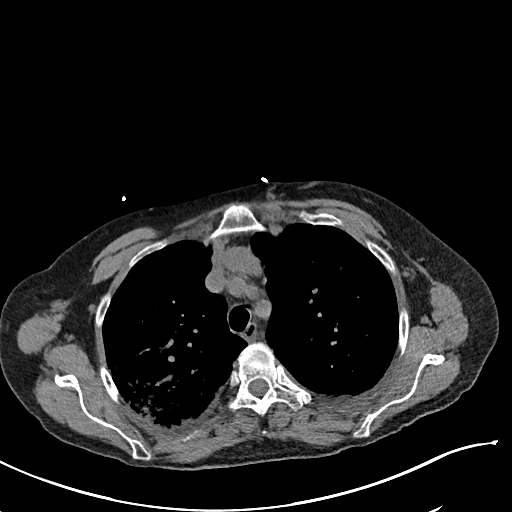
[im 95/135  lung]
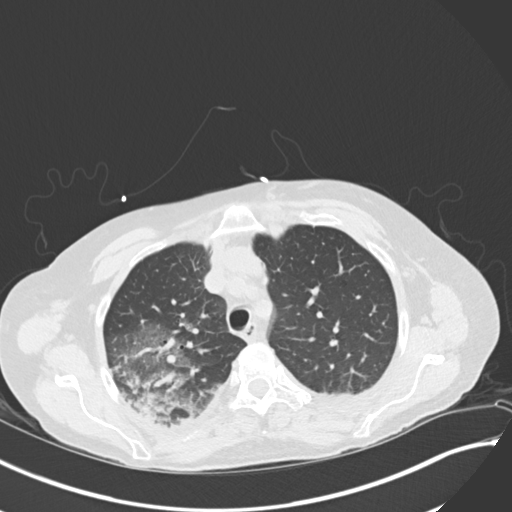
[im 105/135  lung]
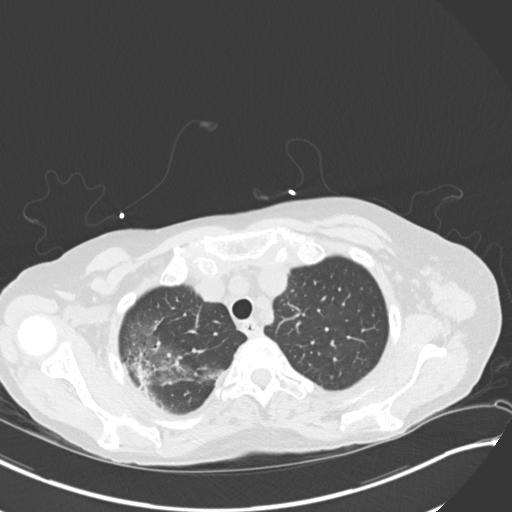
[im 115/135  lung]
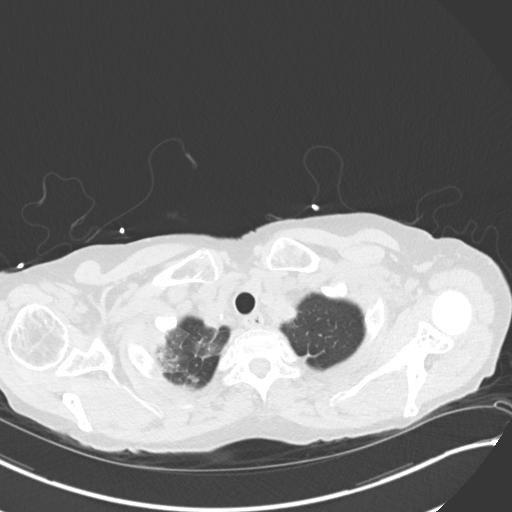
[im 125/135  lung]
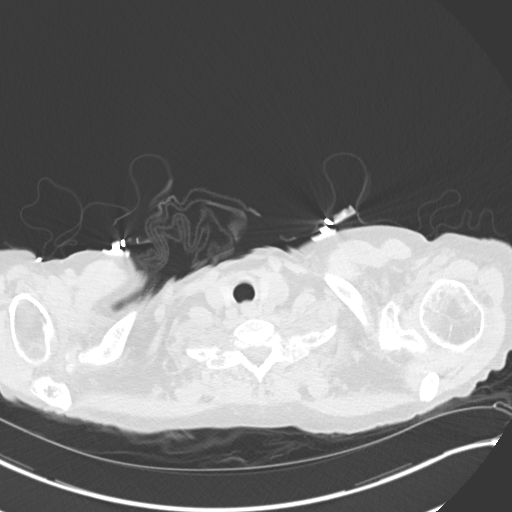

[Series 5: coronal · coronal · 0.54mm/px · 3 of 136 slices shown]
[im 28/136  lung]
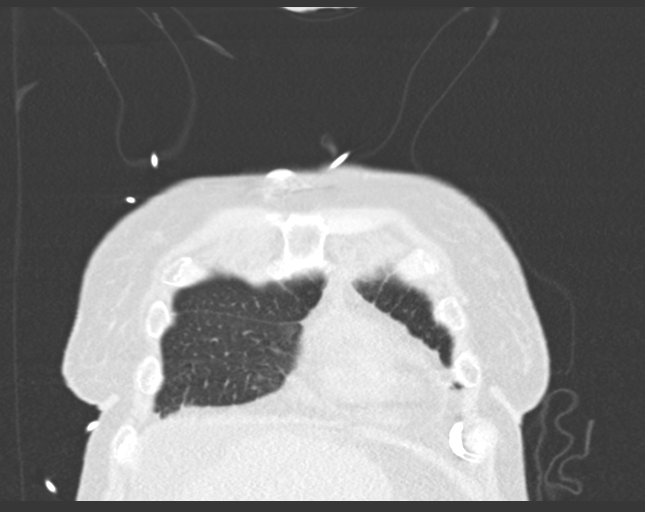
[im 55/136  lung]
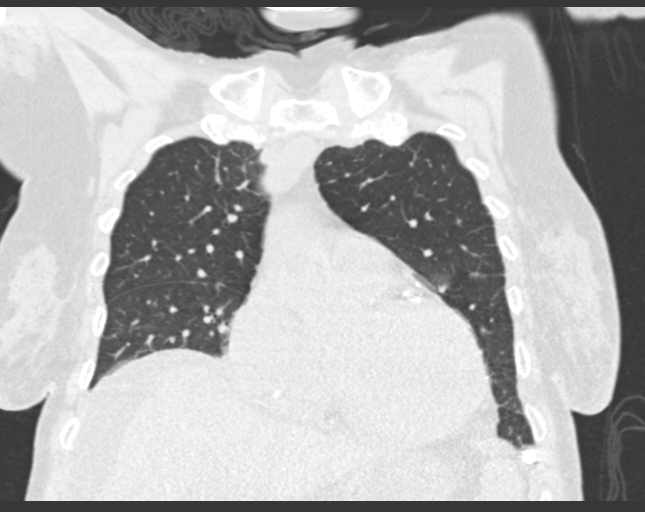
[im 82/136  lung]
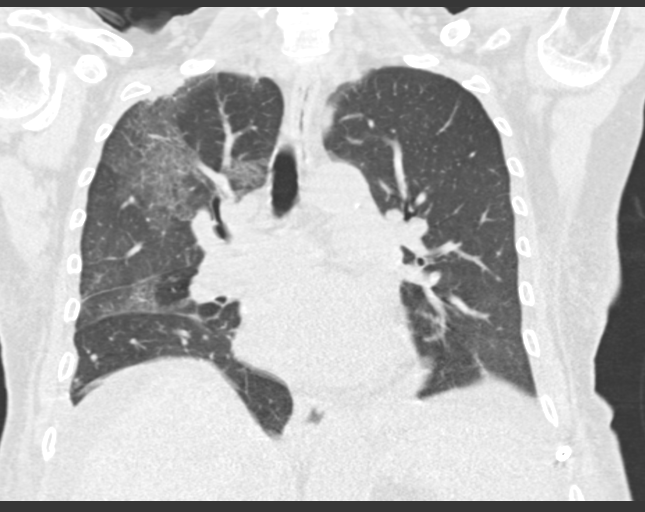

[15 of 36 positions shown; findings below may reference images not displayed]

FINDINGS: Cardiovascular: Cardiac size is stable. Aortic calcifications and
coronary calcifications are noted. Known pericardial effusion is
noted. The previously seen deviation of the heart to the right has
resolved.

Mediastinum/Nodes: Thoracic inlet is within normal limits. No
definitive hilar or mediastinal adenopathy is noted. The previously
seen mediastinal shift has resolved following drainage on the left.
The esophagus is within normal limits as visualized.

Lungs/Pleura: Pigtail catheter is noted in the left lung base
anteriorly. The previously seen hemothorax has reduced significantly
in size. There is improved aeration in the left lung although
persistent lower lobe consolidation remains. The right lung
demonstrates some increased parenchymal opacity likely representing
re-expansion edema related to the reduction of the mediastinal
shift. Small right-sided pleural effusion is noted. Mild lower lobe
consolidation is seen.

Upper Abdomen: Visualized upper abdomen shows new ascites
surrounding the liver.

Musculoskeletal: No acute bony abnormality is noted. Previously seen
left rib fractures are again identified.
IMPRESSION: Significant reduction in left-sided hemothorax following catheter
placement. Significant improved aeration of the left lung is noted
although residual small effusion and lower lobe consolidation
remains. The catheter is somewhat anteriorly oriented with respect
to the residual fluid. No pneumothorax is noted.

New patchy airspace opacity in the right lung likely representing
edema from re-expansion related to the reduction of the mediastinal
shift. Small right-sided pleural effusion and lower lobe
consolidation is noted new from the prior exam.

New ascites in the upper abdomen adjacent to the liver. This may
represent some third spacing of fluid related to changes following
reduction of the left-sided hemothorax.

## 2023-08-16 NOTE — Telephone Encounter (Signed)
 See 8/13 phone note
